# Patient Record
Sex: Female | Born: 1940
Health system: Southern US, Community
[De-identification: ages and names within clinical notes are randomized; demographics above are authoritative.]

## PROBLEM LIST (undated history)

## (undated) DIAGNOSIS — M720 Palmar fascial fibromatosis [Dupuytren]: Secondary | ICD-10-CM

## (undated) DIAGNOSIS — Z9889 Other specified postprocedural states: Secondary | ICD-10-CM

## (undated) DIAGNOSIS — G20A1 Parkinson's disease without dyskinesia, without mention of fluctuations: Secondary | ICD-10-CM

## (undated) DIAGNOSIS — I480 Paroxysmal atrial fibrillation: Secondary | ICD-10-CM

## (undated) DIAGNOSIS — G20 Parkinson's disease: Secondary | ICD-10-CM

## (undated) DIAGNOSIS — I872 Venous insufficiency (chronic) (peripheral): Secondary | ICD-10-CM

## (undated) DIAGNOSIS — M1712 Unilateral primary osteoarthritis, left knee: Secondary | ICD-10-CM

## (undated) DIAGNOSIS — M169 Osteoarthritis of hip, unspecified: Secondary | ICD-10-CM

## (undated) DIAGNOSIS — T6391XA Toxic effect of contact with unspecified venomous animal, accidental (unintentional), initial encounter: Secondary | ICD-10-CM

## (undated) DIAGNOSIS — S52531A Colles' fracture of right radius, initial encounter for closed fracture: Secondary | ICD-10-CM

## (undated) DIAGNOSIS — N959 Unspecified menopausal and perimenopausal disorder: Secondary | ICD-10-CM

## (undated) DIAGNOSIS — I491 Atrial premature depolarization: Secondary | ICD-10-CM

## (undated) HISTORY — DX: Colles' fracture of right radius, initial encounter for closed fracture: S52.531A

## (undated) HISTORY — DX: Osteoarthritis of hip, unspecified: M16.9

## (undated) HISTORY — PX: CATARACT EXTRACTION, BILATERAL: SHX1313

## (undated) HISTORY — PX: BREAST BIOPSY: SHX20

## (undated) HISTORY — DX: Unilateral primary osteoarthritis, left knee: M17.12

## (undated) HISTORY — DX: Other specified postprocedural states: Z98.890

## (undated) HISTORY — DX: Venous insufficiency (chronic) (peripheral): I87.2

## (undated) HISTORY — DX: Atrial premature depolarization: I49.1

## (undated) HISTORY — DX: Toxic effect of contact with unspecified venomous animal, accidental (unintentional), initial encounter: T63.91XA

## (undated) HISTORY — DX: Palmar fascial fibromatosis (dupuytren): M72.0

## (undated) HISTORY — DX: Unspecified menopausal and perimenopausal disorder: N95.9

---

## 1997-03-16 ENCOUNTER — Ambulatory Visit (HOSPITAL_COMMUNITY): Admission: RE | Admit: 1997-03-16 | Discharge: 1997-03-16 | Payer: Self-pay | Admitting: *Deleted

## 1997-03-21 ENCOUNTER — Ambulatory Visit (HOSPITAL_COMMUNITY): Admission: RE | Admit: 1997-03-21 | Discharge: 1997-03-21 | Payer: Self-pay | Admitting: *Deleted

## 1997-12-26 ENCOUNTER — Other Ambulatory Visit: Admission: RE | Admit: 1997-12-26 | Discharge: 1997-12-26 | Payer: Self-pay | Admitting: *Deleted

## 1998-01-30 ENCOUNTER — Encounter: Payer: Self-pay | Admitting: *Deleted

## 1998-01-30 ENCOUNTER — Ambulatory Visit (HOSPITAL_COMMUNITY): Admission: RE | Admit: 1998-01-30 | Discharge: 1998-01-30 | Payer: Self-pay | Admitting: *Deleted

## 1999-01-03 ENCOUNTER — Other Ambulatory Visit: Admission: RE | Admit: 1999-01-03 | Discharge: 1999-01-03 | Payer: Self-pay | Admitting: *Deleted

## 1999-05-23 ENCOUNTER — Encounter: Payer: Self-pay | Admitting: *Deleted

## 1999-05-23 ENCOUNTER — Ambulatory Visit (HOSPITAL_COMMUNITY): Admission: RE | Admit: 1999-05-23 | Discharge: 1999-05-23 | Payer: Self-pay | Admitting: *Deleted

## 2000-01-06 ENCOUNTER — Other Ambulatory Visit: Admission: RE | Admit: 2000-01-06 | Discharge: 2000-01-06 | Payer: Self-pay | Admitting: *Deleted

## 2000-05-27 ENCOUNTER — Encounter: Payer: Self-pay | Admitting: *Deleted

## 2000-05-27 ENCOUNTER — Ambulatory Visit (HOSPITAL_COMMUNITY): Admission: RE | Admit: 2000-05-27 | Discharge: 2000-05-27 | Payer: Self-pay | Admitting: *Deleted

## 2001-01-21 ENCOUNTER — Other Ambulatory Visit: Admission: RE | Admit: 2001-01-21 | Discharge: 2001-01-21 | Payer: Self-pay | Admitting: *Deleted

## 2001-05-31 ENCOUNTER — Ambulatory Visit (HOSPITAL_COMMUNITY): Admission: RE | Admit: 2001-05-31 | Discharge: 2001-05-31 | Payer: Self-pay | Admitting: *Deleted

## 2001-05-31 ENCOUNTER — Encounter: Payer: Self-pay | Admitting: *Deleted

## 2002-02-08 ENCOUNTER — Other Ambulatory Visit: Admission: RE | Admit: 2002-02-08 | Discharge: 2002-02-08 | Payer: Self-pay | Admitting: *Deleted

## 2002-06-06 ENCOUNTER — Encounter: Payer: Self-pay | Admitting: *Deleted

## 2002-06-06 ENCOUNTER — Ambulatory Visit (HOSPITAL_COMMUNITY): Admission: RE | Admit: 2002-06-06 | Discharge: 2002-06-06 | Payer: Self-pay | Admitting: *Deleted

## 2003-03-09 ENCOUNTER — Other Ambulatory Visit: Admission: RE | Admit: 2003-03-09 | Discharge: 2003-03-09 | Payer: Self-pay | Admitting: *Deleted

## 2003-06-07 ENCOUNTER — Ambulatory Visit (HOSPITAL_COMMUNITY): Admission: RE | Admit: 2003-06-07 | Discharge: 2003-06-07 | Payer: Self-pay | Admitting: *Deleted

## 2004-03-12 ENCOUNTER — Other Ambulatory Visit: Admission: RE | Admit: 2004-03-12 | Discharge: 2004-03-12 | Payer: Self-pay | Admitting: *Deleted

## 2004-06-07 ENCOUNTER — Ambulatory Visit (HOSPITAL_COMMUNITY): Admission: RE | Admit: 2004-06-07 | Discharge: 2004-06-07 | Payer: Self-pay | Admitting: *Deleted

## 2004-10-31 ENCOUNTER — Ambulatory Visit: Payer: Self-pay | Admitting: Family Medicine

## 2004-11-08 ENCOUNTER — Encounter: Admission: RE | Admit: 2004-11-08 | Discharge: 2004-11-08 | Payer: Self-pay | Admitting: Family Medicine

## 2004-11-08 ENCOUNTER — Ambulatory Visit: Payer: Self-pay | Admitting: Family Medicine

## 2005-03-25 ENCOUNTER — Other Ambulatory Visit: Admission: RE | Admit: 2005-03-25 | Discharge: 2005-03-25 | Payer: Self-pay | Admitting: *Deleted

## 2005-04-29 ENCOUNTER — Ambulatory Visit: Payer: Self-pay | Admitting: Family Medicine

## 2005-06-10 ENCOUNTER — Ambulatory Visit (HOSPITAL_COMMUNITY): Admission: RE | Admit: 2005-06-10 | Discharge: 2005-06-10 | Payer: Self-pay | Admitting: *Deleted

## 2005-06-30 ENCOUNTER — Encounter: Admission: RE | Admit: 2005-06-30 | Discharge: 2005-06-30 | Payer: Self-pay | Admitting: *Deleted

## 2005-12-25 ENCOUNTER — Ambulatory Visit: Payer: Self-pay | Admitting: Family Medicine

## 2005-12-25 LAB — CONVERTED CEMR LAB
AST: 27 units/L (ref 0–37)
Albumin: 4 g/dL (ref 3.5–5.2)
Alkaline Phosphatase: 36 units/L — ABNORMAL LOW (ref 39–117)
BUN: 13 mg/dL (ref 6–23)
Calcium: 9.4 mg/dL (ref 8.4–10.5)
Chloride: 106 meq/L (ref 96–112)
Chol/HDL Ratio, serum: 2
Creatinine, Ser: 1 mg/dL (ref 0.4–1.2)
Eosinophil percent: 4.7 % (ref 0.0–5.0)
Hemoglobin: 12.2 g/dL (ref 12.0–15.0)
Lymphocytes Relative: 22.6 % (ref 12.0–46.0)
MCHC: 32.2 g/dL (ref 30.0–36.0)
MCV: 90.5 fL (ref 78.0–100.0)
Monocytes Relative: 11.9 % — ABNORMAL HIGH (ref 3.0–11.0)
Neutro Abs: 2.7 10*3/uL (ref 1.4–7.7)
Platelets: 195 10*3/uL (ref 150–400)
RBC: 4.2 M/uL (ref 3.87–5.11)
RDW: 15.3 % — ABNORMAL HIGH (ref 11.5–14.6)
Triglyceride fasting, serum: 35 mg/dL (ref 0–149)
VLDL: 7 mg/dL (ref 0–40)
WBC: 4.4 10*3/uL — ABNORMAL LOW (ref 4.5–10.5)

## 2006-01-05 ENCOUNTER — Ambulatory Visit: Payer: Self-pay | Admitting: Family Medicine

## 2006-08-24 ENCOUNTER — Ambulatory Visit (HOSPITAL_COMMUNITY): Admission: RE | Admit: 2006-08-24 | Discharge: 2006-08-24 | Payer: Self-pay | Admitting: Family Medicine

## 2006-11-03 DIAGNOSIS — J069 Acute upper respiratory infection, unspecified: Secondary | ICD-10-CM | POA: Insufficient documentation

## 2006-11-09 ENCOUNTER — Ambulatory Visit: Payer: Self-pay | Admitting: Family Medicine

## 2007-02-03 ENCOUNTER — Telehealth (INDEPENDENT_AMBULATORY_CARE_PROVIDER_SITE_OTHER): Payer: Self-pay | Admitting: *Deleted

## 2007-02-04 ENCOUNTER — Ambulatory Visit: Payer: Self-pay | Admitting: Family Medicine

## 2007-02-09 ENCOUNTER — Ambulatory Visit: Payer: Self-pay | Admitting: Family Medicine

## 2007-02-09 LAB — CONVERTED CEMR LAB
AST: 20 units/L (ref 0–37)
Albumin: 3.7 g/dL (ref 3.5–5.2)
Bilirubin Urine: NEGATIVE
CO2: 29 meq/L (ref 19–32)
Calcium: 9.5 mg/dL (ref 8.4–10.5)
Chloride: 104 meq/L (ref 96–112)
GFR calc Af Amer: 71 mL/min
Glucose, Bld: 90 mg/dL (ref 70–99)
HCT: 38.5 % (ref 36.0–46.0)
HDL: 83.3 mg/dL (ref >39.0)
Hemoglobin: 13.3 g/dL (ref 12.0–15.0)
Lymphocytes Relative: 13.4 % (ref 12.0–46.0)
MCHC: 34.6 g/dL (ref 30.0–36.0)
Neutrophils Relative %: 72.8 % (ref 43.0–77.0)
Nitrite: NEGATIVE
Potassium: 5.3 meq/L — ABNORMAL HIGH (ref 3.5–5.1)
Total CHOL/HDL Ratio: 2.4
Total Protein: 6.4 g/dL (ref 6.0–8.3)
VLDL: 8 mg/dL (ref 0–40)
WBC Urine, dipstick: NEGATIVE
WBC: 7.6 10*3/uL (ref 4.5–10.5)

## 2007-02-16 ENCOUNTER — Ambulatory Visit: Payer: Self-pay | Admitting: Family Medicine

## 2007-02-16 ENCOUNTER — Encounter: Payer: Self-pay | Admitting: Family Medicine

## 2007-02-16 ENCOUNTER — Other Ambulatory Visit: Admission: RE | Admit: 2007-02-16 | Discharge: 2007-02-16 | Payer: Self-pay | Admitting: Family Medicine

## 2007-02-16 DIAGNOSIS — M169 Osteoarthritis of hip, unspecified: Secondary | ICD-10-CM

## 2007-02-16 DIAGNOSIS — M1610 Unilateral primary osteoarthritis, unspecified hip: Secondary | ICD-10-CM

## 2007-02-16 DIAGNOSIS — I491 Atrial premature depolarization: Secondary | ICD-10-CM

## 2007-02-16 DIAGNOSIS — N959 Unspecified menopausal and perimenopausal disorder: Secondary | ICD-10-CM

## 2007-02-16 DIAGNOSIS — M159 Polyosteoarthritis, unspecified: Secondary | ICD-10-CM | POA: Insufficient documentation

## 2007-02-16 HISTORY — DX: Unilateral primary osteoarthritis, unspecified hip: M16.10

## 2007-02-16 HISTORY — DX: Osteoarthritis of hip, unspecified: M16.9

## 2007-02-16 HISTORY — DX: Atrial premature depolarization: I49.1

## 2007-02-16 HISTORY — DX: Unspecified menopausal and perimenopausal disorder: N95.9

## 2007-07-20 ENCOUNTER — Ambulatory Visit: Payer: Self-pay | Admitting: Family Medicine

## 2007-07-27 ENCOUNTER — Ambulatory Visit: Payer: Self-pay | Admitting: Family Medicine

## 2007-07-27 DIAGNOSIS — T6391XA Toxic effect of contact with unspecified venomous animal, accidental (unintentional), initial encounter: Secondary | ICD-10-CM

## 2007-07-27 HISTORY — DX: Toxic effect of contact with unspecified venomous animal, accidental (unintentional), initial encounter: T63.91XA

## 2007-09-07 ENCOUNTER — Ambulatory Visit (HOSPITAL_COMMUNITY): Admission: RE | Admit: 2007-09-07 | Discharge: 2007-09-07 | Payer: Self-pay | Admitting: Family Medicine

## 2007-09-14 ENCOUNTER — Encounter: Admission: RE | Admit: 2007-09-14 | Discharge: 2007-09-14 | Payer: Self-pay | Admitting: Family Medicine

## 2007-10-24 ENCOUNTER — Telehealth: Payer: Self-pay | Admitting: Family Medicine

## 2007-10-25 ENCOUNTER — Telehealth: Payer: Self-pay | Admitting: Family Medicine

## 2007-12-06 ENCOUNTER — Telehealth: Payer: Self-pay | Admitting: Family Medicine

## 2007-12-07 ENCOUNTER — Telehealth (INDEPENDENT_AMBULATORY_CARE_PROVIDER_SITE_OTHER): Payer: Self-pay | Admitting: *Deleted

## 2008-02-22 ENCOUNTER — Encounter: Admission: RE | Admit: 2008-02-22 | Discharge: 2008-02-22 | Payer: Self-pay | Admitting: Family Medicine

## 2008-03-20 ENCOUNTER — Ambulatory Visit: Payer: Self-pay | Admitting: Family Medicine

## 2008-03-20 LAB — CONVERTED CEMR LAB
ALT: 18 units/L (ref 0–35)
Albumin: 3.8 g/dL (ref 3.5–5.2)
Alkaline Phosphatase: 38 units/L — ABNORMAL LOW (ref 39–117)
BUN: 15 mg/dL (ref 6–23)
Bilirubin, Direct: 0.1 mg/dL (ref 0.0–0.3)
Eosinophils Absolute: 0.2 10*3/uL (ref 0.0–0.7)
GFR calc Af Amer: 80 mL/min
HCT: 37.2 % (ref 36.0–46.0)
HDL: 88.9 mg/dL (ref >39.0)
Hemoglobin, Urine: NEGATIVE
Ketones, ur: NEGATIVE mg/dL
Lymphocytes Relative: 19.9 % (ref 12.0–46.0)
MCHC: 33.3 g/dL (ref 30.0–36.0)
Monocytes Relative: 10.6 % (ref 3.0–12.0)
Neutrophils Relative %: 64 % (ref 43.0–77.0)
Nitrite: NEGATIVE
Platelets: 166 10*3/uL (ref 150–400)
Potassium: 4.1 meq/L (ref 3.5–5.1)
RDW: 16 % — ABNORMAL HIGH (ref 11.5–14.6)
Specific Gravity, Urine: <=1.005 (ref 1.000–1.035)
TSH: 0.88 microintl units/mL (ref 0.35–5.50)
Total Bilirubin: 0.6 mg/dL (ref 0.3–1.2)
Total CHOL/HDL Ratio: 2.2
Total Protein, Urine: NEGATIVE mg/dL
Urobilinogen, UA: 0.2 (ref 0.0–1.0)
VLDL: 6 mg/dL (ref 0–40)
pH: 7.5 (ref 5.0–8.0)

## 2008-04-07 ENCOUNTER — Ambulatory Visit: Payer: Self-pay | Admitting: Family Medicine

## 2008-04-07 ENCOUNTER — Other Ambulatory Visit: Admission: RE | Admit: 2008-04-07 | Discharge: 2008-04-07 | Payer: Self-pay | Admitting: Family Medicine

## 2008-04-07 ENCOUNTER — Encounter: Payer: Self-pay | Admitting: Family Medicine

## 2008-09-12 ENCOUNTER — Encounter: Admission: RE | Admit: 2008-09-12 | Discharge: 2008-09-12 | Payer: Self-pay | Admitting: Family Medicine

## 2009-04-10 ENCOUNTER — Ambulatory Visit: Payer: Self-pay | Admitting: Family Medicine

## 2009-04-10 LAB — CONVERTED CEMR LAB
ALT: 21 units/L (ref 0–35)
Bilirubin Urine: NEGATIVE
Calcium: 9.2 mg/dL (ref 8.4–10.5)
Chloride: 109 meq/L (ref 96–112)
Cholesterol: 195 mg/dL (ref 0–200)
Creatinine, Ser: 1 mg/dL (ref 0.4–1.2)
GFR calc non Af Amer: 58.4 mL/min (ref >60)
Glucose, Bld: 91 mg/dL (ref 70–99)
Hemoglobin, Urine: NEGATIVE
Ketones, ur: NEGATIVE mg/dL
Leukocytes, UA: NEGATIVE
Lymphocytes Relative: 19.7 % (ref 12.0–46.0)
Lymphs Abs: 1 10*3/uL (ref 0.7–4.0)
MCV: 94.5 fL (ref 78.0–100.0)
Monocytes Absolute: 0.6 10*3/uL (ref 0.1–1.0)
Monocytes Relative: 11 % (ref 3.0–12.0)
Neutro Abs: 3.2 10*3/uL (ref 1.4–7.7)
Neutrophils Relative %: 65.5 % (ref 43.0–77.0)
Potassium: 4.6 meq/L (ref 3.5–5.1)
Total CHOL/HDL Ratio: 2
Total Protein, Urine: NEGATIVE mg/dL
Total Protein: 6.9 g/dL (ref 6.0–8.3)
Triglycerides: 36 mg/dL (ref 0.0–149.0)
Urobilinogen, UA: 0.2 (ref 0.0–1.0)
pH: 6.5 (ref 5.0–8.0)

## 2009-04-17 ENCOUNTER — Ambulatory Visit: Payer: Self-pay | Admitting: Family Medicine

## 2009-04-17 ENCOUNTER — Other Ambulatory Visit: Admission: RE | Admit: 2009-04-17 | Discharge: 2009-04-17 | Payer: Self-pay | Admitting: Family Medicine

## 2009-09-13 ENCOUNTER — Encounter: Admission: RE | Admit: 2009-09-13 | Discharge: 2009-09-13 | Payer: Self-pay | Admitting: Family Medicine

## 2010-01-11 ENCOUNTER — Ambulatory Visit: Payer: Self-pay | Admitting: Family Medicine

## 2010-01-11 DIAGNOSIS — R1031 Right lower quadrant pain: Secondary | ICD-10-CM | POA: Insufficient documentation

## 2010-01-16 ENCOUNTER — Ambulatory Visit: Payer: Self-pay | Admitting: Family Medicine

## 2010-01-18 ENCOUNTER — Ambulatory Visit: Payer: Self-pay | Admitting: Cardiovascular Disease

## 2010-01-24 ENCOUNTER — Telehealth: Payer: Self-pay | Admitting: Family Medicine

## 2010-02-10 ENCOUNTER — Encounter: Payer: Self-pay | Admitting: *Deleted

## 2010-02-11 ENCOUNTER — Telehealth: Payer: Self-pay | Admitting: Family Medicine

## 2010-02-21 NOTE — Assessment & Plan Note (Signed)
Summary: abd pain that moves around/dm   Vital Signs:  Patient profile:   70 year old female Menstrual status:  postmenopausal Weight:      136 pounds Temp:     98.1 degrees F oral Pulse rate:   88 / minute Pulse rhythm:   irregular BP sitting:   110 / 78  (left arm) Cuff size:   regular  Vitals Entered By: Kern Reap CMA Duncan Dull) (January 11, 2010 9:26 AM) CC: lower right abd pain   CC:  lower right abd pain.  History of Present Illness: Stephanie Shaw is a 70 year old, divorced female, nonsmoker, who comes in with a two month history of right lower quadrant abdominal pain.  She states for the past two, months she's had right lower quadrant and right flank, abdominal pain.  She describes the pain is intermittent, dull, various intensity from one to 3 and occasionally wake her up at night.  She's had a recent colonoscopy, which was normal.  Review of systems negative specifically, no fever, nausea, vomiting, diarrhea, change in urinary habits, etc., etc.  She's never had a pain like this before.  Again, the intensity is not that great but it is persistent and is waking her up at night.  Allergies: 1)  ! Sulfa  Past History:  Past medical, surgical, family and social histories (including risk factors) reviewed for relevance to current acute and chronic problems.  Past Medical History: Reviewed history from 02/16/2007 and no changes required. childbirth x 2 venous insufficiency R. 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 Benign  Family History: Reviewed history from 02/16/2007 and no changes required. father died 68, lung cancer smoker and alcoholic mother died 13. Pancreatic cancer, diabetes, type II congestive heart failure no brothers two sisters, alive and well one has osteoarthritis  Social History: Reviewed history from 11/09/2006 and no changes required. Occupation: Arts development officer G. Single Divorced Never Smoked Alcohol use-no Drug use-no Regular exercise-yes  Review of Systems      See HPI  Physical Exam  General:  Well-developed,well-nourished,in no acute distress; alert,appropriate and cooperative throughout examination Abdomen:  Bowel sounds positive,abdomen soft and non-tender without masses, organomegaly or hernias noted. Rectal:  No external abnormalities noted. Normal sphincter tone. No rectal masses or tenderness. Genitalia:  Pelvic Exam:        External: normal female genitalia without lesions or masses        Vagina: normal without lesions or masses        Cervix: normal without lesions or masses        Adnexa: normal bimanual exam without masses or fullness        Uterus: normal by palpation        Pap smear: not performed   Problems:  Medical Problems Added: 1)  Dx of Abdominal Pain, Right Lower Quadrant  (ICD-789.03)  Impression & Recommendations:  Problem # 1:  ABDOMINAL PAIN, RIGHT LOWER QUADRANT (ICD-789.03) Assessment New  Orders: Radiology Referral (Radiology)  Complete Medication List: 1)  Mvi  2)  Flax Seed 2000 Mg  3)  Asa 81 Mg  .... Q 3rd day 4)  Estrace 0.1 Mg/gm Crea (Estradiol) .... Apply 2 x weekly 5)  Epipen 2-pak 0.3 Mg/0.50ml (1:1000) Devi (Epinephrine hcl (anaphylaxis)) .... Uad for allergic reaction to eggs 6)  Iron  .... One tab three times a week  Patient Instructions: 1)  we will get she is set up for a  CT scan of your abdomen to begin an evaluation of the cause of your abdominal pain.   Orders Added: 1)  Est. Patient Level IV [55732] 2)  Radiology Referral [Radiology]

## 2010-02-21 NOTE — Miscellaneous (Signed)
Summary: Waiver of Liability for Zostavax  Waiver of Liability for Zostavax   Imported By: Maryln Gottron 04/19/2009 13:19:53  _____________________________________________________________________  External Attachment:    Type:   Image     Comment:   External Document

## 2010-02-21 NOTE — Progress Notes (Signed)
  Phone Note Outgoing Call   Summary of Call: I called Stephanie Shaw.  She still symptomatic.  Reviewed studies a CT scan, which show some constipation and a right ovarian cyst.  Recommend she take a stool softener to have two to 3 loose bowel movements to clean out her colon.  If after two weeks.  Symptoms are persistent, then I want to recheck her in the office.  Also explained what a right ovarian cyst was possibly that could rupture severe pain, etc. Initial call taken by: Roderick Pee MD,  January 24, 2010 7:25 AM

## 2010-02-21 NOTE — Progress Notes (Signed)
  Phone Note Call from Patient   Caller: Patient Call For: Roderick Pee MD Summary of Call: Special Diet has been basically cured .  FYI Initial call taken by: Lynann Beaver CMA AAMA,  February 11, 2010 2:19 PM  Follow-up for Phone Call        Provider Notified Follow-up by: Roderick Pee MD,  February 11, 2010 5:38 PM

## 2010-02-21 NOTE — Assessment & Plan Note (Signed)
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 who comes in today for physical examination  She uses the hormonal cream twice, weekly, it's working well.  She also has an EpiPen because of history of hives with bee stings.  Her past history, social family history are reviewed in detail.  There been no significant changes.  She continues to remain physically active.  Her weight is 134.  Her mood is good.  Hearing is normal in ADLs and normal.  Minimal fall risk.  Home safety reviewed normal height, weight visual acuity, unchanged.  Annual check for glaucoma.  she has a history of PACs asymptomatic  Allergies: 1)  ! Sulfa  Past History:  Past medical, surgical, family and social histories (including risk factors) reviewed, and no changes noted (except as noted below).  Past Medical History: Reviewed history from 02/16/2007 and no changes required. childbirth x 2 venous insufficiency R. 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 Benign  Family History: Reviewed history from 02/16/2007 and no changes required. father died 80, lung cancer smoker and alcoholic mother died 57. Pancreatic cancer, diabetes, type II congestive heart failure no brothers two sisters, alive and well one has osteoarthritis  Social History: Reviewed history from 11/09/2006 and no changes required. Occupation: Engineer, production G. Single Divorced Never Smoked Alcohol use-no Drug use-no Regular  exercise-yes  Review of Systems      See HPI  Physical Exam  General:  Well-developed,well-nourished,in no acute distress; alert,appropriate and cooperative throughout examination Head:  Normocephalic and atraumatic without obvious abnormalities. No apparent alopecia or balding. Eyes:  No corneal or conjunctival inflammation noted. EOMI. Perrla. Funduscopic exam benign, without hemorrhages, exudates or papilledema. Vision grossly normal. Ears:  External ear exam shows no significant lesions or deformities.  Otoscopic examination reveals clear canals, tympanic membranes are intact bilaterally without bulging, retraction, inflammation or discharge. Hearing is grossly normal bilaterally. Nose:  External nasal examination shows no deformity or inflammation. Nasal mucosa are pink and moist without lesions or exudates. Mouth:  Oral mucosa and oropharynx without lesions or exudates.  Teeth in good repair. Neck:  No deformities, masses, or tenderness noted. Chest Wall:  No deformities, masses, or tenderness noted. Breasts:  No mass, nodules, thickening, tenderness, bulging, retraction, inflamation, nipple discharge or skin changes noted.   Lungs:  Normal respiratory effort, chest expands symmetrically. Lungs are clear to auscultation, no crackles or wheezes. Heart:  Normal rate and regular rhythm. S1 and S2 normal without gallop, murmur, click, rub or other extra sounds. Abdomen:  Bowel sounds positive,abdomen soft and non-tender without masses, organomegaly or hernias noted. Rectal:  No external abnormalities noted. Normal sphincter tone. No rectal masses or tenderness. Genitalia:  Pelvic Exam:        External: normal female genitalia without lesions or masses        Vagina: normal without lesions or masses  Cervix: normal without lesions or masses        Adnexa: normal bimanual exam without masses or fullness        Uterus: normal by palpation        Pap smear: performed Msk:  No deformity  or scoliosis noted of thoracic or lumbar spine.   Pulses:  R and L carotid,radial,femoral,dorsalis pedis and posterior tibial pulses are full and equal bilaterally Extremities:  No clubbing, cyanosis, edema, or deformity noted with normal full range of motion of all joints.   Neurologic:  No cranial nerve deficits noted. Station and gait are normal. Plantar reflexes are down-going bilaterally. DTRs are symmetrical throughout. Sensory, motor and coordinative functions appear intact. Skin:  Intact without suspicious lesions or rashes Cervical Nodes:  No lymphadenopathy noted Axillary Nodes:  No palpable lymphadenopathy Inguinal Nodes:  No significant adenopathy Psych:  Cognition and judgment appear intact. Alert and cooperative with normal attention span and concentration. 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 Apply 2 x weekly  Orders: Subsequent annual wellness visit with prevention plan (Y7829)  Problem # 4:  HEALTH SCREENING (ICD-V70.0) Assessment: Unchanged  Orders: Subsequent annual wellness visit with prevention plan (F6213)  Complete Medication List: 1)  Mvi  2)  Flax Seed 2000 Mg  3)  Asa 81 Mg  .... Q 3rd day 4)  Estrace 0.1 Mg/gm Crea (Estradiol) .... Apply 2 x weekly 5)  Epipen 2-pak 0.3 Mg/0.18ml (1:1000) Devi (Epinephrine hcl (anaphylaxis)) .... Uad for allergic reaction to eggs 6)  Iron  .... One tab three times a week  Other  Orders: Zoster (Shingles) Vaccine Live [phone]) Admin 1st Vaccine (84696)  Patient Instructions: 1)  Please schedule a follow-up appointment in 1 year. 2)  It is important that you exercise regularly at least 20 minutes 5 times a week. If you develop chest pain, have severe difficulty breathing, or feel very tired , stop exercising immediately and seek medical attention. 3)  Schedule your mammogram. 4)  Schedule a colonoscopy/sigmoidoscopy to help detect colon cancer. 5)  Take calcium +Vitamin D daily. 6)  Take an Aspirin every day. Prescriptions: EPIPEN 2-PAK 0.3 MG/0.3ML (1:1000) DEVI (EPINEPHRINE HCL (ANAPHYLAXIS)) UAD for allergic reaction to eggs  #1 x 1   Entered and Authorized by:   Roderick Pee MD   Signed by:   Roderick Pee MD on 04/17/2009   Method used:   Electronically to        [facility]  [phone]* (retail)       [address] [place], [place]  [address]       Ph: [phone] or [phone]       Fax: [phone]   RxID:   [id number] ESTRACE 0.1 MG/GM  CREA (ESTRADIOL) apply 2 x weekly  #3 tubes x 4   Entered and Authorized by:   Roderick Pee MD   Signed by:   Roderick Pee MD on 04/17/2009   Method used:   Electronically to  [facility]  [phone]* (retail)       [address] [place], [place]  [address]       Ph: [phone] or [phone]       Fax: [phone]   RxID:   [id number]    Immunizations Administered:  Zostavax # 1:    Vaccine Type: Zostavax    Site: left deltoid    Mfr: Merck    Dose: 0.5 ml    Route: [place]    Given by: Kern Reap CMA (AAMA)    Exp. Date: 02/07/2010    Lot #: [id number]    Physician counseled: yes

## 2010-03-26 ENCOUNTER — Ambulatory Visit (INDEPENDENT_AMBULATORY_CARE_PROVIDER_SITE_OTHER): Payer: BC Managed Care – PPO | Admitting: Internal Medicine

## 2010-03-26 ENCOUNTER — Encounter: Payer: Self-pay | Admitting: Internal Medicine

## 2010-03-26 DIAGNOSIS — S81819A Laceration without foreign body, unspecified lower leg, initial encounter: Secondary | ICD-10-CM

## 2010-03-26 DIAGNOSIS — S81809A Unspecified open wound, unspecified lower leg, initial encounter: Secondary | ICD-10-CM

## 2010-03-26 DIAGNOSIS — M169 Osteoarthritis of hip, unspecified: Secondary | ICD-10-CM

## 2010-03-26 NOTE — Patient Instructions (Signed)
Call if you develop pain redness or drainage from the wound involving the left lower leg Call or return to clinic prn if these symptoms worsen or fail to improve as anticipated.   Clean the area daily with soap and water and apply antibiotic ointment

## 2010-03-26 NOTE — Progress Notes (Signed)
  Subjective:    Patient ID: Stephanie Shaw, female    DOB: 1940/06/19, 70 y.o.   MRN: [id number]  HPI   70 year old patient who sustained a fall and laceration to the left lower posterior leg 6 days ago. She also landed on her knees sustaining ecchymoses. She will be  Leaving  out of state tomorrow  M. Is concerned about a possible soft tissue infection. There is been no pain drainage or redness.  Review of Systems  Constitutional: Negative.   HENT: Negative for hearing loss, congestion, sore throat, rhinorrhea, dental problem, sinus pressure and tinnitus.   Eyes: Negative for pain, discharge and visual disturbance.  Respiratory: Negative for cough and shortness of breath.   Cardiovascular: Negative for chest pain, palpitations and leg swelling.  Gastrointestinal: Negative for nausea, vomiting, abdominal pain, diarrhea, constipation, blood in stool and abdominal distention.  Genitourinary: Negative for dysuria, urgency, frequency, hematuria, flank pain, vaginal bleeding, vaginal discharge, difficulty urinating, vaginal pain and pelvic pain.  Musculoskeletal: Negative for joint swelling, arthralgias and gait problem.  Skin: Positive for wound. Negative for rash.  Neurological: Negative for dizziness, syncope, speech difficulty, weakness, numbness and headaches.  Hematological: Negative for adenopathy.  Psychiatric/Behavioral: Negative for behavioral problems, dysphoric mood and agitation. The patient is not nervous/anxious.        Objective:   Physical Exam  Constitutional: She appears well-developed and well-nourished. No distress.  Skin: Skin is warm and dry.        Ecchymoses were present over both knees the left greater than the right;  She had a superficial laceration abrasion involving her left posterior lower leg. There is some mild associated ecchymoses but no erythema induration tenderness          Assessment & Plan:   laceration abrasion left lower leg. No signs of  infection. Local skin care discussed she'll continue topical antibiotic therapy and clean and cover daily

## 2010-05-06 ENCOUNTER — Other Ambulatory Visit: Payer: Self-pay | Admitting: Family Medicine

## 2010-05-06 ENCOUNTER — Other Ambulatory Visit (INDEPENDENT_AMBULATORY_CARE_PROVIDER_SITE_OTHER): Payer: BC Managed Care – PPO

## 2010-05-06 DIAGNOSIS — Z0000 Encounter for general adult medical examination without abnormal findings: Secondary | ICD-10-CM

## 2010-05-06 LAB — URINALYSIS
Ketones, ur: NEGATIVE
Leukocytes, UA: NEGATIVE
Nitrite: NEGATIVE
pH: 6 (ref 5.0–8.0)

## 2010-05-06 LAB — HEPATIC FUNCTION PANEL
Alkaline Phosphatase: 41 U/L (ref 39–117)
Bilirubin, Direct: 0.1 mg/dL (ref 0.0–0.3)
Total Bilirubin: 0.8 mg/dL (ref 0.3–1.2)

## 2010-05-06 LAB — LIPID PANEL
Cholesterol: 190 mg/dL (ref 0–200)
HDL: 93.5 mg/dL (ref >39.00)
LDL Cholesterol: 90 mg/dL (ref 0–99)
Total CHOL/HDL Ratio: 2
VLDL: 6.2 mg/dL (ref 0.0–40.0)

## 2010-05-06 LAB — BASIC METABOLIC PANEL
Chloride: 106 mEq/L (ref 96–112)
Potassium: 4.4 mEq/L (ref 3.5–5.1)

## 2010-05-06 LAB — CBC WITH DIFFERENTIAL/PLATELET
Basophils Relative: 1 % (ref 0.0–3.0)
Eosinophils Absolute: 0.1 10*3/uL (ref 0.0–0.7)
HCT: 40.4 % (ref 36.0–46.0)
Hemoglobin: 13.6 g/dL (ref 12.0–15.0)
MCHC: 33.7 g/dL (ref 30.0–36.0)
MCV: 95.5 fl (ref 78.0–100.0)
Monocytes Absolute: 0.3 10*3/uL (ref 0.1–1.0)
Neutro Abs: 2.5 10*3/uL (ref 1.4–7.7)
RBC: 4.23 Mil/uL (ref 3.87–5.11)

## 2010-05-13 ENCOUNTER — Encounter: Payer: Self-pay | Admitting: Family Medicine

## 2010-05-13 ENCOUNTER — Ambulatory Visit (INDEPENDENT_AMBULATORY_CARE_PROVIDER_SITE_OTHER): Payer: BC Managed Care – PPO | Admitting: Family Medicine

## 2010-05-13 VITALS — BP 108/70 | Temp 97.9°F | Ht 67.5 in | Wt 136.0 lb

## 2010-05-13 DIAGNOSIS — M25561 Pain in right knee: Secondary | ICD-10-CM

## 2010-05-13 DIAGNOSIS — I499 Cardiac arrhythmia, unspecified: Secondary | ICD-10-CM

## 2010-05-13 DIAGNOSIS — N959 Unspecified menopausal and perimenopausal disorder: Secondary | ICD-10-CM

## 2010-05-13 DIAGNOSIS — I4891 Unspecified atrial fibrillation: Secondary | ICD-10-CM

## 2010-05-13 DIAGNOSIS — I491 Atrial premature depolarization: Secondary | ICD-10-CM

## 2010-05-13 MED ORDER — DILTIAZEM HCL 30 MG PO TABS: 30.0000 mg | ORAL_TABLET | Freq: Three times a day (TID) | ORAL | Status: DC

## 2010-05-13 NOTE — Patient Instructions (Signed)
Take your aspirin daily.  Begin Cardizem 30 mg daily.  We will set showed a consult time with Dr. Shawna Clamp for cardiac evaluation
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
## 2010-05-14 ENCOUNTER — Telehealth: Payer: Self-pay | Admitting: Cardiology

## 2010-05-14 NOTE — Telephone Encounter (Signed)
Terri states pt needed to be seen asap. Made a appt for may 1 for dr Shirlee Latch double book. Please let her know or mildred if its okay.

## 2010-05-17 ENCOUNTER — Encounter: Payer: Self-pay | Admitting: Cardiology

## 2010-05-21 ENCOUNTER — Ambulatory Visit (INDEPENDENT_AMBULATORY_CARE_PROVIDER_SITE_OTHER): Payer: BC Managed Care – PPO | Admitting: Cardiology

## 2010-05-21 ENCOUNTER — Encounter: Payer: Self-pay | Admitting: Cardiology

## 2010-05-21 VITALS — BP 124/62 | HR 88 | Ht 67.0 in | Wt 133.0 lb

## 2010-05-21 DIAGNOSIS — I4891 Unspecified atrial fibrillation: Secondary | ICD-10-CM

## 2010-05-21 HISTORY — DX: Unspecified atrial fibrillation: I48.91

## 2010-05-21 MED ORDER — RIVAROXABAN 20 MG PO TABS: 20.0000 mg | ORAL_TABLET | Freq: Every day | ORAL | Status: DC

## 2010-05-21 MED ORDER — DILTIAZEM HCL ER COATED BEADS 120 MG PO CP24: 120.0000 mg | ORAL_CAPSULE | Freq: Every day | ORAL | Status: DC

## 2010-05-21 NOTE — Assessment & Plan Note (Addendum)
Patient has minimally symptomatic atrial fibrillation.  It may have been persistent between 4/23 and today.  CHADSVASC score is 2 (female gender, age > 106).  Her mother had atrial fibrillation.   - Echocardiogram for cardiac function. - Change short-acting diltiazem to diltiazem CD 120 mg daily.   - CHADS2VASC score of 2 places the patient at 2.2% risk for stroke over the next year.  Given her low bleeding risk, it would be reasonable to anticoagulate her.  She is amenable to this.  My first choice here will be rivaroxaban 20 mg daily.  If this is not covered by her insurance, will try Pradaxa 150 mg bid.  If this is also uncovered, would go with coumadin.  On anticoagulation, she will stop aspirin.  - Patient is minimally symptomatic at this time with rate control on diltiazem.  There is no clear indication for cardioversion.  I will see her back in 1 month after she gets on anticoagulation.  If she feels that her exercise tolerance has gone down in atrial fibrillation, we can consider a cardioversion at that time.

## 2010-05-21 NOTE — Progress Notes (Signed)
PCP: Dr. Tawanna Cooler  70 yo with newly-noted atrial fibrillation presents for cardiology evaluation.  Patient has had prior history of PACs, but atrial fibrillation was noted for the first time on 05/13/10.  She says that she noted her heart rate running a bit high prior to her visit to Dr. 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  She had a regular visit with Dr. Tawanna Cooler on 4/23 and was noted to be in atrial fibrillation.  She was started on aspirin and diltiazem 30 mg tid.  She is still in atrial fibrillation today with heart rate in the 80s-90s.  Prior to 4/23, she has never been noted to have atrial fibrillation, but she did have an episode in 12/11 where her resting heart rate was >100 and she was not allowed to donate blood. She has not noted any exercise limitation.  She walks, hikes, and occasionally bikes for exercise.  No exertional dyspnea.  No chest pain.  No lightheadedness or syncope.  In early 4/12, she had a vertigo-type episode, but this resolved. Of note, her mother had atrial fibrillation.  No known heart valvular disease, no HTN, no heavy ETOH intake.  TSH was normal on recent check.  No bleeding history.   ECG (05/13/10): Atrial fibrillation, rate 126 ECG: (05/21/10): Atrial fibrillation, rate 91  Labs (4/12): TSH normal, LFTs normal, HCT 40, K 4.2, creatinine 0.9, LDL 90, HDL 94  PMH:  1. Atrial fibrillation: First noted on 05/13/10.  Had PACs noted prior to this. 2. Duputren's contractures 3. Osteoarthritis 4. Venous insufficiency  SH: Divorced, math professor at [employer], has children, lives alone.  Nonsmoker, rare ETOH.   FH: Mother with atrial fibrillation, CHF, pacemaker, and pancreatic cancer.  Father with lung cancer.   ROS: All systems reviewed and negative except as per HPI.   Current Outpatient Prescriptions  Medication Sig Dispense Refill  . calcium-vitamin D 250-100 MG-UNIT per tablet Take 1 tablet by mouth daily.        Marland Kitchen  EPINEPHrine (EPIPEN 2-PAK) 0.3 mg/0.3 mL DEVI Inject 0.3 mg into the muscle once.        Marland Kitchen estradiol (ESTRACE) 0.1 MG/GM vaginal cream Place 2 g vaginally 2 (two) times a week.        . ferrous sulfate 325 (65 FE) MG EC tablet Take 325 mg by mouth 3 (three) times a week.        . Flaxseed, Linseed, (FLAX SEED OIL) 1000 MG CAPS Take 2 capsules by mouth daily.        . Multiple Vitamin (MULTIVITAMIN) tablet Take 1 tablet by mouth daily.        Marland Kitchen DISCONTD: aspirin 81 MG tablet Take 81 mg by mouth daily.        Marland Kitchen DISCONTD: diltiazem (CARDIZEM) 30 MG tablet Take 1 tablet (30 mg total) by mouth 3 (three) times daily.  90 tablet  3  . diltiazem (CARDIZEM CD) 120 MG 24 hr capsule Take 1 capsule (120 mg total) by mouth daily.  30 capsule  11  . Rivaroxaban (XARELTO) 20 MG TABS Take 20 mg by mouth daily.  30 tablet  6  . DISCONTD: IRON PO 1 tab three times a week.         BP 124/62  Pulse 88  Ht 5\' 7"  (1.702 m)  Wt 133 lb (60.328 kg)  BMI 20.83 kg/m2 General: NAD Neck: No JVD, no thyromegaly or thyroid nodule.  Lungs:  Clear to auscultation bilaterally with normal respiratory effort. CV: Nondisplaced PMI.  Heart regular S1/S2, no S3/S4, no murmur.  No peripheral edema.  No carotid bruit.  Normal pedal pulses.  Abdomen: Soft, nontender, no hepatosplenomegaly, no distention.  Skin: Intact without lesions or rashes.  Neurologic: Alert and oriented x 3.  Psych: Normal affect. Extremities: No clubbing or cyanosis.  HEENT: Normal.

## 2010-05-21 NOTE — Patient Instructions (Signed)
Your physician recommends that you schedule a follow-up appointment in: 1 month with Dr. Shirlee Latch Your physician has requested that you have an echocardiogram. Echocardiography is a painless test that uses sound waves to create images of your heart. It provides your doctor with information about the size and shape of your heart and how well your heart's chambers and valves are working. This procedure takes approximately one hour. There are no restrictions for this procedure. Your physician has recommended you make the following change in your medication: Finish short acting cardizem that you have. When finished start Cardizem CD 120 mg by mouth daily. Start Xarelto 20 mg by mouth daily. If not covered by insurance start Pradaxa 150 mg by mouth twice daily.  Please call us to let us know if you do not start Xarelto.  Once you start Xarelto or Pradaxa you should stop your aspirin.

## 2010-06-10 ENCOUNTER — Ambulatory Visit (HOSPITAL_COMMUNITY): Payer: BC Managed Care – PPO | Attending: Family Medicine | Admitting: Radiology

## 2010-06-10 DIAGNOSIS — I059 Rheumatic mitral valve disease, unspecified: Secondary | ICD-10-CM | POA: Insufficient documentation

## 2010-06-10 DIAGNOSIS — I379 Nonrheumatic pulmonary valve disorder, unspecified: Secondary | ICD-10-CM | POA: Insufficient documentation

## 2010-06-10 DIAGNOSIS — I4891 Unspecified atrial fibrillation: Secondary | ICD-10-CM | POA: Insufficient documentation

## 2010-06-10 DIAGNOSIS — I079 Rheumatic tricuspid valve disease, unspecified: Secondary | ICD-10-CM | POA: Insufficient documentation

## 2010-06-17 ENCOUNTER — Emergency Department (HOSPITAL_COMMUNITY)
Admission: EM | Admit: 2010-06-17 | Discharge: 2010-06-17 | Disposition: A | Discharge status: Home / Self Care | Payer: BC Managed Care – PPO | Attending: Emergency Medicine | Admitting: Emergency Medicine

## 2010-06-17 ENCOUNTER — Emergency Department (HOSPITAL_COMMUNITY): Payer: BC Managed Care – PPO

## 2010-06-17 DIAGNOSIS — X500XXA Overexertion from strenuous movement or load, initial encounter: Secondary | ICD-10-CM | POA: Insufficient documentation

## 2010-06-17 DIAGNOSIS — M542 Cervicalgia: Secondary | ICD-10-CM | POA: Insufficient documentation

## 2010-06-17 DIAGNOSIS — I4891 Unspecified atrial fibrillation: Secondary | ICD-10-CM | POA: Insufficient documentation

## 2010-06-17 DIAGNOSIS — R51 Headache: Secondary | ICD-10-CM | POA: Insufficient documentation

## 2010-06-17 DIAGNOSIS — S139XXA Sprain of joints and ligaments of unspecified parts of neck, initial encounter: Secondary | ICD-10-CM | POA: Insufficient documentation

## 2010-06-17 DIAGNOSIS — R42 Dizziness and giddiness: Secondary | ICD-10-CM | POA: Insufficient documentation

## 2010-06-17 DIAGNOSIS — Z79899 Other long term (current) drug therapy: Secondary | ICD-10-CM | POA: Insufficient documentation

## 2010-06-18 ENCOUNTER — Telehealth: Payer: Self-pay | Admitting: *Deleted

## 2010-06-18 NOTE — Telephone Encounter (Signed)
[facility] Triage Call Report Triage Record Num: [id number] Operator: Elita Boone Patient Name: Stephanie Shaw Call Date & Time: 06/17/2010 8:19:19AM Patient Phone: [phone] PCP: Eugenio Hoes. Todd Patient Gender: Female PCP Fax : [phone] Patient DOB: [birth year] [facility] Name: [facility] Reason for Call: Pt is calling to report that she is have neck pain, onset "1 wk". Pt reports that pain is worse over the last couple of days. Pt instructed to be seen in ER for neck pain and sever headache. Care advice given for Necl Pain . Protocol(s) Used: Neck Pain or Injury Recommended Outcome per Protocol: See ED Immediately Reason for Outcome: Neck pain with forward head movement (no injury) AND severe generalized headache, fever, or altered mental status Care Advice: ~ Another adult should drive. ~ IMMEDIATE ACTION Write down provider's name. List or place the following in a bag for transport with the patient: current prescription and/or nonprescription medications; alternative treatments, therapies and medications; and street drugs. ~ ~ Call EMS 911 if new or worsening drowsiness/difficulty awakening, confusion, or seizure. May have clear liquids (such as water, clear fruit juices without pulp, soda, tea or coffee without dairy or non-dairy creamer, clear broth or bouillon, oral hydration solution, or plain gelatin, fruit ices/popsicles, hard candy) but do not eat solid foods before being seen by your provider. ~ 06/17/2010 8:29:48AM Page 1 of 1 [email or web address]

## 2010-06-20 ENCOUNTER — Encounter: Payer: Self-pay | Admitting: Family Medicine

## 2010-06-20 ENCOUNTER — Ambulatory Visit (INDEPENDENT_AMBULATORY_CARE_PROVIDER_SITE_OTHER): Payer: BC Managed Care – PPO | Admitting: Family Medicine

## 2010-06-20 VITALS — BP 102/64 | Temp 98.3°F | Wt 135.0 lb

## 2010-06-20 DIAGNOSIS — M542 Cervicalgia: Secondary | ICD-10-CM

## 2010-06-20 DIAGNOSIS — E042 Nontoxic multinodular goiter: Secondary | ICD-10-CM

## 2010-06-20 HISTORY — DX: Cervicalgia: M54.2

## 2010-06-20 NOTE — Progress Notes (Signed)
  Subjective:    Patient ID: Stephanie Shaw, female    DOB: 04/29/1940, 70 y.o.   MRN: [id number]  HPIWalker is a 89-year-old female, who comes in today for evaluation of neck pain.  She states over the last couple months.  She has changed the way.  She works at work and has developed neck pain.  She points to the C2, C3, area in the midline of her neck.  Over this past weekend.  The neck pain went from a 4 to a 9 and because of severe pain.  She went to the emergency room.  CT scan of her head was normal.  CT scan of the spine shows some disk disease facet disk space narrowing.  Also noticed some cervical nodules.  Today, her pain is more dull, a 4 on a scale of one to 10 and tolerable.  She is stop the Flexeril and the pain pills.  No history of trauma.  No history of previous thyroid disease, nor radiation    Review of Systems General neurologic and endocrinologic review of systems otherwise negative   Objective:   Physical Exam    Well-developed well-nourished, female, no acute distress.  Neurologic exam shows normal sensation, strength muscles and reflexes.  She has tenderness at C3, C4, area to palpation.  Thyroid gland feels normal.  I cannot palpate any abnormalities    Assessment & Plan:  Cervical disk disease,,,,,,,,,,, PT consult for evaluation and treatment.  Thyroid nodules on CT scan,,,,,,,,,,, ultrasound per radiologist.  Recommendation

## 2010-06-20 NOTE — Patient Instructions (Signed)
We will get to set up for a consult for evaluation and treatment for your neck problem.  We will also get to set up for an ultrasound of the thyroid gland.  I will call you the report

## 2010-06-24 ENCOUNTER — Other Ambulatory Visit: Payer: Self-pay | Admitting: Family Medicine

## 2010-06-24 DIAGNOSIS — E041 Nontoxic single thyroid nodule: Secondary | ICD-10-CM

## 2010-06-26 ENCOUNTER — Ambulatory Visit
Admission: RE | Admit: 2010-06-26 | Discharge: 2010-06-26 | Disposition: A | Discharge status: Home / Self Care | Payer: BC Managed Care – PPO | Source: Ambulatory Visit | Attending: Family Medicine | Admitting: Family Medicine

## 2010-06-26 ENCOUNTER — Other Ambulatory Visit: Payer: BC Managed Care – PPO

## 2010-06-26 DIAGNOSIS — E041 Nontoxic single thyroid nodule: Secondary | ICD-10-CM

## 2010-06-27 ENCOUNTER — Telehealth: Payer: Self-pay | Admitting: *Deleted

## 2010-06-27 ENCOUNTER — Telehealth: Payer: Self-pay | Admitting: Cardiology

## 2010-06-27 NOTE — Telephone Encounter (Signed)
Triage vm--------received a shot on 06-17-2010. She is having more bruising on her back at the injection site. She will call her cardiologist in between time to check about her medication and the bruising. Please advise.

## 2010-06-27 NOTE — Telephone Encounter (Signed)
Patient states she started Xarelto 20 mg daily a month on May 1st./12. On May 28 th she went to ER for neck pain and stiffeners. An injection on her  hip  was given at that time. Pt. States at the beginning it  was a small bruise at the injection site. It got bigger over the next 10 to 12 days. She does not seen any bruise change or spreading the last few days. Pt has stop taken the medication today. Scott weaver [place] consulted. He recommended for pt. To continue taken Xarelto 20 mg to prevent a blood clots due to Dx. A  Fib. Pt to call PCP for the bruising. Patient aware. She verbalized understanding.

## 2010-06-27 NOTE — Telephone Encounter (Signed)
C/o bruise on back hip seem to be getting bigger. Pt stop taken the blood thinner this am.

## 2010-06-27 NOTE — Progress Notes (Signed)
Quick Note:  Pt informed and she voiced her understanding ______ 

## 2010-06-27 NOTE — Telephone Encounter (Signed)
[facility] Triage Call Report Triage Record Num: [id number] Operator: Remonia Richter Patient Name: Stephanie Shaw Call Date & Time: 06/26/2010 7:46:20PM Patient Phone: [phone] PCP: Eugenio Hoes. Todd Patient Gender: Female PCP Fax : [phone] Patient DOB: 10-06-1940 Practice Name: [facility] Reason for Call: got an anti-inflammatory injection 06/17/10 in left hip and bruise bigger tonight, she noted this when she looked in the mirror, now sees it across lower back and buttock,nontender and dark in color, has not looked at the bruise in a week,asking about holding her thinner/Xarelton tonight, all emergent s/s ruled out,home care advise given per Abrasion/wound guideline, callback perimeters gone over,Dr Drue Novel called and informed and advised she mark the bleeding edges and watch for increase in size tonight, hold Xarelto in am, call office for eval of area and go to ER for worsening s/s as told her her per MD advise Protocol(s) Used: Abrasions, Lacerations, Puncture Wounds Recommended Outcome per Protocol: See Provider within 72 Hours Reason for Outcome: Prolonged bleeding from minor cuts, nicks, etc. Care Advice: ~ Do not change prescribed medications, dosing regimen, or other treatments until consulting with your provider. Immediately after an injury apply a cold compress or cloth-covered ice pack for 15 to 20 minutes to reduce the amount of bruising and swelling. ~ ~ Thoroughly wash hands with soap and water before and after touching the site. Wash around wound gently with mild soap and water, then wash wound itself with just water or saline, if it is available. Pat dry. ~ Cover wound with a clean cloth and apply firm pressure to stop the bleeding. Hold steady pressure for 10 minutes or until bleeding stops. Apply a clean dressing. ~ ~ SYMPTOM / CONDITION MANAGEMENT ~ CAUTIONS DO NOT take aspirin, or aspirin-containing medications, ibuprofen or naproxen until consulting  with provider. These medications can increase risk of bleeding or bruising and delays wound healing. ~ ~ If have recently started a new prescribed, nonprescribed, or alternative medicine, call your provider. ~ See a provider immediately if bleeding is not controlled with 15 minutes of direct pressure. 06/26/2010 8:15:39PM Page 1 of 1 [email or web address]

## 2010-07-09 ENCOUNTER — Ambulatory Visit (INDEPENDENT_AMBULATORY_CARE_PROVIDER_SITE_OTHER): Payer: BC Managed Care – PPO | Admitting: Family Medicine

## 2010-07-09 ENCOUNTER — Encounter: Payer: Self-pay | Admitting: Family Medicine

## 2010-07-09 VITALS — BP 102/74 | Temp 98.5°F | Wt 135.0 lb

## 2010-07-09 DIAGNOSIS — E042 Nontoxic multinodular goiter: Secondary | ICD-10-CM

## 2010-07-09 NOTE — Progress Notes (Signed)
  Subjective:    Patient ID: Stephanie Shaw, female    DOB: 1940/04/06, 70 y.o.   MRN: [id number]  HPI Stephanie Shaw is a 70 year old, divorced female, nonsmoker, who comes in today for evaluation of a multinodular goiter.  The scan was reviewed in detailed with the patient.  She has multiple benign-appearing nodules.  Her TSH level is .55 and has been this way for over for 5 years.  She currently asymptomatic with no dysphasia.  We discussed for his options, which including watchful waiting, doing nothing, thyroid supplementation to decrease the size of the nodules however, since she is not having symptoms I would not recommend any medication at this juncture.  Review of Systems    General an endocrinologic review of systems negative except she is due to go back and see Dr. Roger Shelter.  This week.  She is in atrial fibrillation.  Currently rate is 80 to 90 and irregular.  She is on anticoagulation Objective:   Physical Exam     Well-developed well-nourished, female in no acute distress.  Examination.  Next is multiple nodules throughout both glands   Assessment & Plan:  Multinodular goiter.  Plan observe check TSH level and physical exam in 3 months

## 2010-07-09 NOTE — Patient Instructions (Signed)
Follow-up in 3 months, sooner if any problems remember to get nonfasting thyroid labs one week prior

## 2010-07-11 ENCOUNTER — Encounter: Payer: Self-pay | Admitting: Cardiology

## 2010-07-11 ENCOUNTER — Ambulatory Visit (INDEPENDENT_AMBULATORY_CARE_PROVIDER_SITE_OTHER): Payer: BC Managed Care – PPO | Admitting: Cardiology

## 2010-07-11 VITALS — BP 104/73 | Ht 67.75 in | Wt 132.8 lb

## 2010-07-11 DIAGNOSIS — I4891 Unspecified atrial fibrillation: Secondary | ICD-10-CM

## 2010-07-11 MED ORDER — DILTIAZEM HCL ER COATED BEADS 180 MG PO CP24: 180.0000 mg | ORAL_CAPSULE | Freq: Every day | ORAL | Status: DC

## 2010-07-11 NOTE — Patient Instructions (Signed)
Increase Diltiazem CD to 180mg  daily.  You have an appointment with Dr Shirlee Latch Wednesday September 26,2012 at 4:15pm.

## 2010-07-13 NOTE — Assessment & Plan Note (Signed)
Patient has minimally symptomatic persistent atrial fibrillation.  CHADSVASC score is 2 (female gender, age > 30).  Her mother had atrial fibrillation.  Echo showed preserved LV function and no significant valvular dysfunction.   - She will continue diltiazem CD and Xarelto.  I will increase the diltiazem CD to 180 mg daily as her HR is a bit elevated at rest.  - Patient is minimally symptomatic at this time with rate control on diltiazem.  I offered her cardioversion as this is her first episode of atrial fibrillation, but she wants to manage conservatively for now, which is perfectly appropriate.  If she feels that her exercise tolerance has gone down in atrial fibrillation, we can consider a cardioversion at that time.  I will see her back in 3 months.

## 2010-07-13 NOTE — Progress Notes (Signed)
PCP: Dr. Tawanna Cooler  70 yo with recent onset atrial fibrillation returns for cardiology evaluation.  Patient has had prior history of PACs, but atrial fibrillation was noted for the first time on 05/13/10.  She says that she noted her heart rate running a bit high prior to her visit to Dr. 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  She had a regular visit with Dr. Tawanna Cooler on 4/23 and was noted to be in atrial fibrillation. She was still in atrial fibrillation when I saw her in the office last month and she is in atrial fibrillation today.  Prior to 4/23, she was never noted to have atrial fibrillation, but she did have an episode in 12/11 where her resting heart rate was >100 and she was not allowed to donate blood. She has not noted any exercise limitation.  She walks, hikes (6-8 miles up and down hills), and occasionally bikes for exercise.  No exertional dyspnea.  No chest pain.  No lightheadedness or syncope. She is now taking diltiazem CD and Xarelto.    I had her do an echo.  This showed EF 55%, normal valves, moderate biatrial enlargement, and normal RV.    ECG (05/13/10): Atrial fibrillation, rate 126 ECG: (05/21/10): Atrial fibrillation, rate 91 ECG: (6/12): Atrial fibrillat, left axis deviation, poor anterior R wave progression, rate 95  Labs (4/12): TSH normal, LFTs normal, HCT 40, K 4.2, creatinine 0.9, LDL 90, HDL  PMH:  1. Atrial fibrillation: First noted on 05/13/10.  Had PACs noted prior to this.  Persistent.  Echo (5/12): EF 55%, normal wall thickness, normal valves, moderate biatrial enlargement, normal RV.  2. Duputren's contractures 3. Osteoarthritis 4. Venous insufficiency 5. Multinodular goiter: Nontoxic.   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  Nonsmoker, rare ETOH.   FH: Mother with atrial fibrillation, CHF, pacemaker, and pancreatic cancer.  Father with lung cancer.    Current Outpatient  Prescriptions  Medication Sig Dispense Refill  . calcium-vitamin D 250-100 MG-UNIT per tablet Take 1 tablet by mouth daily.        Marland Kitchen EPINEPHrine (EPIPEN 2-PAK) 0.3 mg/0.3 mL DEVI Inject 0.3 mg into the muscle once.        Marland Kitchen estradiol (ESTRACE) 0.1 MG/GM vaginal cream Place 2 g vaginally 2 (two) times a week.        . ferrous sulfate 325 (65 FE) MG EC tablet Take 325 mg by mouth 3 (three) times a week.        . Flaxseed, Linseed, (FLAX SEED OIL) 1000 MG CAPS Take 2 capsules by mouth daily.        . Multiple Vitamin (MULTIVITAMIN) tablet Take 1 tablet by mouth daily.        . Rivaroxaban (XARELTO) 20 MG TABS Take 20 mg by mouth daily.  30 tablet  6  . diltiazem (CARDIZEM CD) 180 MG 24 hr capsule Take 1 capsule (180 mg total) by mouth daily.  30 capsule  6    Ht 5' 7.75" (1.721 m)  Wt 132 lb 12.8 oz (60.238 kg)  BMI 20.34 kg/m2 General: NAD Neck: No JVD, no thyromegaly or thyroid nodule.  Lungs: Clear to auscultation bilaterally with normal respiratory effort. CV: Nondisplaced PMI.  Heart irregular S1/S2, no S3/S4, no murmur.  No peripheral edema.  No carotid bruit.  Normal pedal pulses.  Abdomen: Soft, nontender, no hepatosplenomegaly, no distention.  Neurologic: Alert and oriented  x 3.  Psych: Normal affect. Extremities: No clubbing or cyanosis.

## 2010-07-23 NOTE — Progress Notes (Signed)
Addended by: Judithe Modest D on: 07/23/2010 12:24 PM   Modules accepted: Orders

## 2010-08-13 ENCOUNTER — Other Ambulatory Visit: Payer: Self-pay | Admitting: Family Medicine

## 2010-08-13 DIAGNOSIS — Z1231 Encounter for screening mammogram for malignant neoplasm of breast: Secondary | ICD-10-CM

## 2010-09-18 ENCOUNTER — Ambulatory Visit
Admission: RE | Admit: 2010-09-18 | Discharge: 2010-09-18 | Disposition: A | Discharge status: Home / Self Care | Payer: BC Managed Care – PPO | Source: Ambulatory Visit | Attending: Family Medicine | Admitting: Family Medicine

## 2010-09-18 DIAGNOSIS — Z1231 Encounter for screening mammogram for malignant neoplasm of breast: Secondary | ICD-10-CM

## 2010-10-01 ENCOUNTER — Other Ambulatory Visit: Payer: BC Managed Care – PPO

## 2010-10-02 ENCOUNTER — Ambulatory Visit: Payer: BC Managed Care – PPO

## 2010-10-02 DIAGNOSIS — E042 Nontoxic multinodular goiter: Secondary | ICD-10-CM

## 2010-10-08 ENCOUNTER — Ambulatory Visit: Payer: BC Managed Care – PPO | Admitting: Family Medicine

## 2010-10-16 ENCOUNTER — Encounter: Payer: Self-pay | Admitting: Cardiology

## 2010-10-16 ENCOUNTER — Ambulatory Visit (INDEPENDENT_AMBULATORY_CARE_PROVIDER_SITE_OTHER): Payer: BC Managed Care – PPO | Admitting: Cardiology

## 2010-10-16 VITALS — BP 110/72 | HR 88 | Ht 67.0 in | Wt 139.0 lb

## 2010-10-16 DIAGNOSIS — I4891 Unspecified atrial fibrillation: Secondary | ICD-10-CM

## 2010-10-16 MED ORDER — DILTIAZEM HCL ER COATED BEADS 180 MG PO CP24: 180.0000 mg | ORAL_CAPSULE | Freq: Every day | ORAL | Status: DC

## 2010-10-16 MED ORDER — RIVAROXABAN 20 MG PO TABS: 20.0000 mg | ORAL_TABLET | Freq: Every day | ORAL | Status: DC

## 2010-10-16 NOTE — Patient Instructions (Signed)
Your physician recommends that you return for lab work tomorrow--BMP/CBC--this is scheduled for the [facility] lab.  Your physician wants you to follow-up in: 6 months with Dr Shirlee Latch.(March 2013).You will receive a reminder letter in the mail two months in advance. If you don't receive a letter, please call our office to schedule the follow-up appointment.

## 2010-10-17 ENCOUNTER — Other Ambulatory Visit: Payer: BC Managed Care – PPO

## 2010-10-17 NOTE — Progress Notes (Signed)
PCP: Dr. Tawanna Cooler  70 yo with recent onset atrial fibrillation returns for cardiology evaluation.  Patient has had prior history of PACs, but atrial fibrillation was noted for the first time on 05/13/10.  She says that she noted her heart rate running a bit high prior to her visit to Dr. 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  She had a regular visit with Dr. Tawanna Cooler on 4/23 and was noted to be in atrial fibrillation. She seems to have remained in atrial fibrillation since that time.  Prior to 4/23, she was never noted to have atrial fibrillation, but she did have an episode in 12/11 where her resting heart rate was >100 and she was not allowed to donate blood. She has not noted any exercise limitation.  She walks, hikes (6-8 miles up and down hills), and occasionally bikes for exercise.  No exertional dyspnea.  No chest pain.  Exercise tolerance has not changed.  No lightheadedness or syncope. She is now taking diltiazem CD and Xarelto.    ECG (05/13/10): Atrial fibrillation, rate 126 ECG (05/21/10): Atrial fibrillation, rate 91 ECG (6/12): Atrial fibrillation, left axis deviation, poor anterior R wave progression, rate 95 ECG (9/12): atrial fibrillation, rate 80  Labs (4/12): TSH normal, LFTs normal, HCT 40, K 4.2, creatinine 0.9, LDL 90, HDL Labs (9/12): TSH, free T4, fre T3 all normal  PMH:  1. Atrial fibrillation: First noted on 05/13/10.  Had PACs noted prior to this.  Persistent.  Echo (5/12): EF 55%, normal wall thickness, normal valves, moderate biatrial enlargement, normal RV.  2. Duputren's contractures 3. Osteoarthritis 4. Venous insufficiency 5. Multinodular goiter: Nontoxic.   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  Nonsmoker, rare ETOH.   FH: Mother with atrial fibrillation, CHF, pacemaker, and pancreatic cancer.  Father with lung cancer.    Current Outpatient Prescriptions  Medication Sig Dispense  Refill  . calcium-vitamin D 250-100 MG-UNIT per tablet Take 1 tablet by mouth daily.        Marland Kitchen diltiazem (CARDIZEM CD) 180 MG 24 hr capsule Take 1 capsule (180 mg total) by mouth daily.  90 capsule  3  . EPINEPHrine (EPIPEN 2-PAK) 0.3 mg/0.3 mL DEVI Inject 0.3 mg into the muscle once.        Marland Kitchen estradiol (ESTRACE) 0.1 MG/GM vaginal cream Place 2 g vaginally 2 (two) times a week.        . ferrous sulfate 325 (65 FE) MG EC tablet Take 325 mg by mouth 3 (three) times a week.        . Flaxseed, Linseed, (FLAX SEED OIL) 1000 MG CAPS Take 2 capsules by mouth daily.        . Multiple Vitamin (MULTIVITAMIN) tablet Take 1 tablet by mouth daily.        . Rivaroxaban (XARELTO) 20 MG TABS Take 20 mg by mouth daily.  90 tablet  3    BP 110/72  Pulse 88  Ht 5\' 7"  (1.702 m)  Wt 139 lb (63.05 kg)  BMI 21.77 kg/m2 General: NAD Neck: No JVD, no thyromegaly or thyroid nodule.  Lungs: Clear to auscultation bilaterally with normal respiratory effort. CV: Nondisplaced PMI.  Heart irregular S1/S2, no S3/S4, no murmur.  No peripheral edema.  No carotid bruit.  Normal pedal pulses.  Abdomen: Soft, nontender, no hepatosplenomegaly, no distention.  Neurologic: Alert and oriented x 3.  Psych: Normal affect. Extremities: No  clubbing or cyanosis.

## 2010-10-17 NOTE — Assessment & Plan Note (Signed)
Patient has minimally symptomatic persistent atrial fibrillation.  CHADSVASC score is 2 (female gender, age > 68).  Her mother had atrial fibrillation.  Echo showed preserved LV function and no significant valvular dysfunction.   - She will continue diltiazem CD and Xarelto.  BMET and CBC today.  - Patient is minimally symptomatic at this time with rate control on diltiazem.  I offered her cardioversion as this is her first episode of atrial fibrillation, but she wants to manage conservatively for now, which is perfectly appropriate. I will see her back in 6 months.

## 2010-10-18 ENCOUNTER — Other Ambulatory Visit (INDEPENDENT_AMBULATORY_CARE_PROVIDER_SITE_OTHER): Payer: BC Managed Care – PPO

## 2010-10-18 DIAGNOSIS — I4891 Unspecified atrial fibrillation: Secondary | ICD-10-CM

## 2010-10-18 LAB — BASIC METABOLIC PANEL
BUN: 19 mg/dL (ref 6–23)
CO2: 29 mEq/L (ref 19–32)
Chloride: 108 mEq/L (ref 96–112)
Creatinine, Ser: 0.9 mg/dL (ref 0.4–1.2)
Glucose, Bld: 89 mg/dL (ref 70–99)

## 2010-10-18 LAB — CBC WITH DIFFERENTIAL/PLATELET
Basophils Relative: 1.1 % (ref 0.0–3.0)
Eosinophils Absolute: 0.1 10*3/uL (ref 0.0–0.7)
MCHC: 32.8 g/dL (ref 30.0–36.0)
MCV: 95.3 fl (ref 78.0–100.0)
Monocytes Absolute: 0.4 10*3/uL (ref 0.1–1.0)
Neutrophils Relative %: 61.9 % (ref 43.0–77.0)
RBC: 4.29 Mil/uL (ref 3.87–5.11)

## 2010-10-21 ENCOUNTER — Encounter: Payer: Self-pay | Admitting: Cardiology

## 2010-12-30 ENCOUNTER — Telehealth: Payer: Self-pay | Admitting: Family Medicine

## 2010-12-30 MED ORDER — HYDROCODONE-HOMATROPINE 5-1.5 MG/5ML PO SYRP: 2.5000 mL | ORAL_SOLUTION | Freq: Four times a day (QID) | ORAL | Status: AC | PRN

## 2010-12-30 NOTE — Telephone Encounter (Signed)
Pt has a head cold and has a heart condition. Pt said that the pharmacist recommend that pt take Tylenol. Pt said that this is not helping with stuffy head and congestion. Pt is wondering what Dr Tawanna Cooler would recommend? OTC or script is fine. [facility].

## 2010-12-30 NOTE — Telephone Encounter (Signed)
Hydromet dispense 4ounces directions one half to 1 teaspoon nightly  p.r.n. For cough, and cold, refills x 1

## 2011-01-02 ENCOUNTER — Telehealth: Payer: Self-pay | Admitting: Family Medicine

## 2011-01-02 NOTE — Telephone Encounter (Signed)
Pt called and has had a bloody nose for 3 days. Pt said that its on the one side of nose that has deviated septum. Pt can not get bleeding to stop.

## 2011-01-02 NOTE — Telephone Encounter (Signed)
Tell her to call Dr. Narda Bonds in medicine.  Throat to be seen today

## 2011-01-02 NOTE — Telephone Encounter (Signed)
Spoke with patient.

## 2011-08-25 ENCOUNTER — Telehealth: Payer: Self-pay | Admitting: Family Medicine

## 2011-08-25 DIAGNOSIS — N959 Unspecified menopausal and perimenopausal disorder: Secondary | ICD-10-CM

## 2011-08-25 DIAGNOSIS — Z0000 Encounter for general adult medical examination without abnormal findings: Secondary | ICD-10-CM

## 2011-08-25 NOTE — Telephone Encounter (Signed)
Pt is sch for cpx on 11-24-2011. Pt is requesting to go to [facility] for cpx labs week prior. Pt works at [employer]. If ok please put order in system

## 2011-08-25 NOTE — Telephone Encounter (Signed)
Labs ordered.

## 2011-08-26 ENCOUNTER — Other Ambulatory Visit: Payer: Self-pay | Admitting: Family Medicine

## 2011-08-26 DIAGNOSIS — Z1231 Encounter for screening mammogram for malignant neoplasm of breast: Secondary | ICD-10-CM

## 2011-09-29 ENCOUNTER — Ambulatory Visit
Admission: RE | Admit: 2011-09-29 | Discharge: 2011-09-29 | Disposition: A | Discharge status: Home / Self Care | Payer: BC Managed Care – PPO | Source: Ambulatory Visit | Attending: Family Medicine | Admitting: Family Medicine

## 2011-09-29 DIAGNOSIS — Z1231 Encounter for screening mammogram for malignant neoplasm of breast: Secondary | ICD-10-CM

## 2011-11-04 ENCOUNTER — Other Ambulatory Visit: Payer: Self-pay | Admitting: Cardiology

## 2011-11-17 ENCOUNTER — Other Ambulatory Visit (INDEPENDENT_AMBULATORY_CARE_PROVIDER_SITE_OTHER): Payer: BC Managed Care – PPO

## 2011-11-17 DIAGNOSIS — N959 Unspecified menopausal and perimenopausal disorder: Secondary | ICD-10-CM

## 2011-11-17 DIAGNOSIS — Z0000 Encounter for general adult medical examination without abnormal findings: Secondary | ICD-10-CM

## 2011-11-17 LAB — CBC
HCT: 40 % (ref 36.0–46.0)
Hemoglobin: 13 g/dL (ref 12.0–15.0)
MCHC: 32.6 g/dL (ref 30.0–36.0)
RDW: 13.8 % (ref 11.5–14.6)

## 2011-11-17 LAB — BASIC METABOLIC PANEL
BUN: 21 mg/dL (ref 6–23)
Creatinine, Ser: 1 mg/dL (ref 0.4–1.2)
GFR: 61.5 mL/min (ref >60.00)
Potassium: 4.7 mEq/L (ref 3.5–5.1)

## 2011-11-17 LAB — URINALYSIS
Specific Gravity, Urine: 1.01 (ref 1.000–1.030)
Total Protein, Urine: NEGATIVE
Urine Glucose: NEGATIVE

## 2011-11-17 LAB — LIPID PANEL
Cholesterol: 194 mg/dL (ref 0–200)
VLDL: 4.8 mg/dL (ref 0.0–40.0)

## 2011-11-17 LAB — HEPATIC FUNCTION PANEL
ALT: 23 U/L (ref 0–35)
AST: 29 U/L (ref 0–37)
Alkaline Phosphatase: 42 U/L (ref 39–117)
Bilirubin, Direct: 0.1 mg/dL (ref 0.0–0.3)
Total Bilirubin: 0.7 mg/dL (ref 0.3–1.2)

## 2011-11-24 ENCOUNTER — Ambulatory Visit (INDEPENDENT_AMBULATORY_CARE_PROVIDER_SITE_OTHER): Payer: BC Managed Care – PPO | Admitting: Family Medicine

## 2011-11-24 ENCOUNTER — Encounter: Payer: Self-pay | Admitting: Family Medicine

## 2011-11-24 VITALS — BP 110/68 | Temp 98.2°F | Ht 68.0 in | Wt 138.0 lb

## 2011-11-24 DIAGNOSIS — N959 Unspecified menopausal and perimenopausal disorder: Secondary | ICD-10-CM

## 2011-11-24 DIAGNOSIS — Z01419 Encounter for gynecological examination (general) (routine) without abnormal findings: Secondary | ICD-10-CM

## 2011-11-24 DIAGNOSIS — Z23 Encounter for immunization: Secondary | ICD-10-CM

## 2011-11-24 DIAGNOSIS — Z0000 Encounter for general adult medical examination without abnormal findings: Secondary | ICD-10-CM

## 2011-11-24 DIAGNOSIS — T63441A Toxic effect of venom of bees, accidental (unintentional), initial encounter: Secondary | ICD-10-CM

## 2011-11-24 DIAGNOSIS — I4891 Unspecified atrial fibrillation: Secondary | ICD-10-CM

## 2011-11-24 MED ORDER — ESTRADIOL 0.1 MG/GM VA CREA: 2.0000 g | TOPICAL_CREAM | VAGINAL | Status: DC

## 2011-11-24 MED ORDER — RIVAROXABAN 20 MG PO TABS: 20.0000 mg | ORAL_TABLET | Freq: Every day | ORAL | Status: DC

## 2011-11-24 MED ORDER — DILTIAZEM HCL ER COATED BEADS 180 MG PO CP24: 180.0000 mg | ORAL_CAPSULE | Freq: Every day | ORAL | Status: DC

## 2011-11-24 MED ORDER — EPINEPHRINE 0.3 MG/0.3ML IJ DEVI: 0.3000 mg | Freq: Once | INTRAMUSCULAR | Status: DC

## 2011-11-24 NOTE — Patient Instructions (Signed)
Continue your current medications  Call and arrange a followup cardiac consult with Dr. Sherlie Ban  Return in one year sooner if any problems

## 2011-11-24 NOTE — Progress Notes (Signed)
  Subjective:    Patient ID: Stephanie Shaw, female    DOB: 03-09-1940, 71 y.o.   MRN: [id number]  HPI Stephanie Shaw is a delightful 29 year old divorced female nonsmoker retired Editor, commissioning who comes in today for general physical examination and a Medicare wellness exam  She takes diltiazem 180 mg daily along with xeralto because of A. fib. Heart rate varies between 70 and 80. She's asymptomatic.  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  Cognitive function normal she walks on a regular basis home health safety reviewed no issues identified, no guns in the house, she does have a health care power of attorney and living well   Review of Systems  Constitutional: Negative.   HENT: Negative.   Eyes: Negative.   Respiratory: Negative.   Cardiovascular: Negative.   Gastrointestinal: Negative.   Genitourinary: Negative.   Musculoskeletal: Negative.   Neurological: Negative.   Hematological: Negative.   Psychiatric/Behavioral: Negative.        Objective:   Physical Exam  Constitutional: She appears well-developed and well-nourished.  HENT:  Head: Normocephalic and atraumatic.  Right Ear: External ear normal.  Left Ear: External ear normal.  Nose: Nose normal.  Mouth/Throat: Oropharynx is clear and moist.  Eyes: EOM are normal. Pupils are equal, round, and reactive to light.  Neck: Normal range of motion. Neck supple. No thyromegaly present.       No carotid bruits  Cardiovascular: Regular rhythm, normal heart sounds and intact distal pulses.  Exam reveals no gallop and no friction rub.   No murmur heard.      Irregularly irregular,,,,,,, 70-80,,,  Pulmonary/Chest: Effort normal and breath sounds normal.  Abdominal:  Soft. Bowel sounds are normal. She exhibits no distension and no mass. There is no tenderness. There is no rebound.  Genitourinary: Vagina normal and uterus normal. Guaiac negative stool. No vaginal discharge found.  Musculoskeletal: Normal range of motion.  Lymphadenopathy:    She has no cervical adenopathy.  Neurological: She is alert. She has normal reflexes. No cranial nerve deficit. She exhibits normal muscle tone. Coordination normal.  Skin: Skin is warm and dry.       Total body skin exam normal  Psychiatric: She has a normal mood and affect. Her behavior is normal. Judgment and thought content normal.          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

## 2012-01-05 ENCOUNTER — Other Ambulatory Visit: Payer: Self-pay | Admitting: Cardiology

## 2012-01-07 ENCOUNTER — Encounter: Payer: Self-pay | Admitting: Cardiology

## 2012-01-07 ENCOUNTER — Ambulatory Visit (INDEPENDENT_AMBULATORY_CARE_PROVIDER_SITE_OTHER): Payer: BC Managed Care – PPO | Admitting: Cardiology

## 2012-01-07 VITALS — BP 104/77 | HR 63 | Ht 68.0 in | Wt 137.0 lb

## 2012-01-07 DIAGNOSIS — I4891 Unspecified atrial fibrillation: Secondary | ICD-10-CM

## 2012-01-07 NOTE — Patient Instructions (Addendum)
Your physician recommends that you return for lab work--BMET/CBCd. This is scheduled for April 15,2014. Lab hours are 7:30am-5pm.   Your physician wants you to follow-up in: 1 year with Dr Shirlee Latch. (December 2014). You will receive a reminder letter in the mail two months in advance. If you don't receive a letter, please call our office to schedule the follow-up appointment.

## 2012-01-09 NOTE — Progress Notes (Signed)
Patient ID: Stephanie Shaw, female   DOB: 04-28-1940, 71 y.o.   MRN: [id number] PCP: Dr. Tawanna Cooler  71 yo with chronic atrial fibrillation returns for cardiology evaluation.  Patient has had prior history of PACs, but atrial fibrillation was noted for the first time on 4/12.  She seems to have remained in atrial fibrillation since that time.  She walks, hikes (6-8 miles up and down hills), and occasionally bikes for exercise.  No exertional dyspnea.  No chest pain.  Exercise tolerance has not changed.  No lightheadedness or syncope. No stroke-like symptoms.  No palpitations.  No falls or overt GI bleeding.  She is now taking diltiazem CD and Xarelto.    ECG (05/13/10): Atrial fibrillation, rate 126 ECG (05/21/10): Atrial fibrillation, rate 91 ECG (6/12): Atrial fibrillation, left axis deviation, poor anterior R wave progression, rate 95 ECG (9/12): atrial fibrillation, rate 80  Labs (4/12): TSH normal, LFTs normal, HCT 40, K 4.2, creatinine 0.9, LDL 90, HDL Labs (9/12): TSH, free T4, fre T3 all normal Labs (10/13): K 4.7, creatinine 1.0, HCT 40, LDL 94  PMH:  1. Atrial fibrillation: First noted on 05/13/10.  Had PACs noted prior to this.  Persistent.  Echo (5/12): EF 55%, normal wall thickness, normal valves, moderate biatrial enlargement, normal RV.  2. Duputren's contractures 3. Osteoarthritis 4. Venous insufficiency 5. Multinodular goiter: Nontoxic.   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  Nonsmoker, rare ETOH.   FH: Mother with atrial fibrillation, CHF, pacemaker, and pancreatic cancer.  Father with lung cancer.    Current Outpatient Prescriptions  Medication Sig Dispense Refill  . calcium-vitamin D 250-100 MG-UNIT per tablet Take 1 tablet by mouth daily.        Marland Kitchen diltiazem (CARDIZEM CD) 180 MG 24 hr capsule Take 1 capsule (180 mg total) by mouth daily.  90 capsule  3  . EPINEPHrine (EPIPEN 2-PAK) 0.3 mg/0.3 mL DEVI Inject 0.3 mLs (0.3 mg total) into the muscle  once.  1 Device  1  . estradiol (ESTRACE) 0.1 MG/GM vaginal cream Place 0.25 Applicatorfuls vaginally 2 (two) times a week.  42.5 g  11  . ferrous sulfate 325 (65 FE) MG EC tablet Take 325 mg by mouth 3 (three) times a week.       . Flaxseed, Linseed, (FLAX SEED OIL) 1000 MG CAPS Take 2 capsules by mouth daily.        . Multiple Vitamin (MULTIVITAMIN) tablet Take 1 tablet by mouth daily.        Carlena Hurl 20 MG TABS TAKE 1 TABLET BY MOUTH EVERY DAY  90 tablet  1    BP 104/77  Pulse 63  Ht 5\' 8"  (1.727 m)  Wt 137 lb (62.143 kg)  BMI 20.83 kg/m2 General: NAD Neck: No JVD, no thyromegaly or thyroid nodule.  Lungs: Clear to auscultation bilaterally with normal respiratory effort. CV: Nondisplaced PMI.  Heart irregular S1/S2, no S3/S4, no murmur.  No peripheral edema.  No carotid bruit.  Normal pedal pulses.  Abdomen: Soft, nontender, no hepatosplenomegaly, no distention.  Neurologic: Alert and oriented x 3.  Psych: Normal affect. Extremities: No clubbing or cyanosis.   Assessment/Plan:  Atrial fibrillation   Patient has minimally symptomatic persistent atrial fibrillation. CHADSVASC score is 2 (female gender, age > 14). Her mother had atrial fibrillation. Echo showed preserved LV function and no significant valvular dysfunction.  - She will continue diltiazem CD and Xarelto. BMET and CBC in 4/13.  - Patient is  minimally symptomatic at this time with rate control on diltiazem. In the past, I offered her cardioversion, but she wanted to manage conservatively, which is perfectly appropriate. I will see her back in 6 months.   Marca Ancona 01/09/2012

## 2012-04-09 ENCOUNTER — Telehealth: Payer: Self-pay | Admitting: Family Medicine

## 2012-04-09 NOTE — Telephone Encounter (Signed)
Patient Information:  Caller Name: Akeila  Phone: [phone]  Patient: Stephanie Shaw  Gender: Female  DOB: [birth year]  Age: 72 Years  PCP: K[facility])  Office Follow Up:  Does the office need to follow up with this patient?: No  Instructions For The Office: N/A  RN Note:  Redness around injured area, scabbed over, size is 3/4 inch wide and 1 1/2 inch tall, no drainage noted.  Symptoms  Reason For Call & Symptoms: Rt knee abrasion  Reviewed Health History In EMR: Yes  Reviewed Medications In EMR: Yes  Reviewed Allergies In EMR: Yes  Reviewed Surgeries / Procedures: Yes  Date of Onset of Symptoms: 04/02/2012  Treatments Tried: Neosporin, non stick adhesive pads,  Treatments Tried Worked: No  Guideline(s) Used:  Knee Injury  Leg Injury  Wound Infection  Disposition Per Guideline:   Home Care  Reason For Disposition Reached:   Wound doesn't sound infected, or only mild redness  Advice Given:  Antibiotic Ointment:  Apply an antibiotic ointment 3 times a day. If the area could become dirty, cover with a Band-Aid or a clean gauze dressing.  Expected Course:  Pain and swelling normally peak on day 2. Any redness should go away by day 3 or 4. Complete healing should occur by day 10.  Call Back If:   Wound becomes more tender  Redness starts to spread  Pus, drainage, or fever occurs  You become worse  Patient Will Follow Care Advice:  YES

## 2012-04-12 ENCOUNTER — Ambulatory Visit (INDEPENDENT_AMBULATORY_CARE_PROVIDER_SITE_OTHER): Payer: BC Managed Care – PPO | Admitting: Internal Medicine

## 2012-04-12 ENCOUNTER — Encounter: Payer: Self-pay | Admitting: Internal Medicine

## 2012-04-12 VITALS — BP 110/70 | HR 98 | Temp 98.3°F | Resp 18

## 2012-04-12 DIAGNOSIS — I4891 Unspecified atrial fibrillation: Secondary | ICD-10-CM

## 2012-04-12 DIAGNOSIS — S81809A Unspecified open wound, unspecified lower leg, initial encounter: Secondary | ICD-10-CM

## 2012-04-12 DIAGNOSIS — S81801A Unspecified open wound, right lower leg, initial encounter: Secondary | ICD-10-CM

## 2012-04-12 NOTE — Progress Notes (Signed)
Subjective:    Patient ID: Stephanie Shaw, female    DOB: 1940/11/09, 72 y.o.   MRN: [id number]  HPI  72 year old patient who has a history of atrial for ablation and is on chronic anticoagulation. She fell forward 10 days ago sustaining trauma to both knees. She was concerned about a nonhealing wound involving her right knee area.  Past Medical History  Diagnosis Date  . DEGENERATIVE JOINT DISEASE, RIGHT HIP 02/16/2007  . POSTMENOPAUSAL SYNDROME 02/16/2007  . PREMATURE ATRIAL CONTRACTIONS 02/16/2007  . Toxic effect of venom 07/27/2007  . Venous insufficiency   . Dupuytren's contracture   . Osteoarthritis of left knee   . Osteoarthritis of hip     right  . S/P breast biopsy, left     two o'clock position - benign    History   Social History  . Marital Status: Divorced    Spouse Name: N/A    Number of Children: N/A  . Years of Education: N/A   Occupational History  . Not on file.   Social History Main Topics  . Smoking status: Former Games developer  . Smokeless tobacco: Not on file  . Alcohol Use: No  . Drug Use: Not on file  . Sexually Active: Not on file   Other Topics Concern  . Not on file   Social History Narrative  . No narrative on file    History reviewed. No pertinent past surgical history.  Family History  Problem Relation Age of Onset  . Cancer Father   . Alcohol abuse Father   . Cancer Mother   . Diabetes Mother   . Heart failure Mother   . Osteoarthritis Sister     Allergies  Allergen Reactions  . Sulfonamide Derivatives     REACTION: HIVES    Current Outpatient Prescriptions on File Prior to Visit  Medication Sig Dispense Refill  . calcium-vitamin D 250-100 MG-UNIT per tablet Take 1 tablet by mouth daily.        Marland Kitchen diltiazem (CARDIZEM CD) 180 MG 24 hr capsule Take 1 capsule (180 mg total) by mouth daily.  90 capsule  3  . EPINEPHrine (EPIPEN 2-PAK) 0.3 mg/0.3 mL DEVI Inject 0.3 mLs (0.3 mg total) into the muscle once.  1 Device  1  . estradiol  (ESTRACE) 0.1 MG/GM vaginal cream Place 0.25 Applicatorfuls vaginally 2 (two) times a week.  42.5 g  11  . ferrous sulfate 325 (65 FE) MG EC tablet Take 325 mg by mouth 3 (three) times a week.       . Flaxseed, Linseed, (FLAX SEED OIL) 1000 MG CAPS Take 2 capsules by mouth daily.        . Multiple Vitamin (MULTIVITAMIN) tablet Take 1 tablet by mouth daily.        Carlena Hurl 20 MG TABS TAKE 1 TABLET BY MOUTH EVERY DAY  90 tablet  1   No current facility-administered medications on file prior to visit.    BP 110/70  Pulse 98  Temp(Src) 98.3 F (36.8 C) (Oral)  Resp 18  SpO2 97%       Review of Systems  Skin: Positive for wound.       Objective:   Physical Exam  Constitutional: She appears well-developed and well-nourished. No distress.  Afebrile  Skin:  1.5 x 3.0 cm crusted lesion just distal to the right patella. Lesion was clean without any erythema or drainage  Ecchymoses were noted involving left lower leg  Assessment & Plan:   Wound the right leg Traumatic ecchymoses  We'll continue local wound care. There are no signs of infection. Will call if there is any clinical change

## 2012-04-12 NOTE — Patient Instructions (Signed)
Wash the wound once daily and leave open to air  Call if there is any worsening pain erythema or drainage or if you  Develop  fever

## 2012-05-18 ENCOUNTER — Other Ambulatory Visit (INDEPENDENT_AMBULATORY_CARE_PROVIDER_SITE_OTHER): Payer: BC Managed Care – PPO

## 2012-05-18 DIAGNOSIS — I4891 Unspecified atrial fibrillation: Secondary | ICD-10-CM

## 2012-05-19 LAB — BASIC METABOLIC PANEL
BUN: 16 mg/dL (ref 6–23)
CO2: 29 mEq/L (ref 19–32)
Chloride: 104 mEq/L (ref 96–112)
Glucose, Bld: 73 mg/dL (ref 70–99)
Potassium: 4.3 mEq/L (ref 3.5–5.1)

## 2012-05-19 LAB — CBC WITH DIFFERENTIAL/PLATELET
Basophils Relative: 0.3 % (ref 0.0–3.0)
Eosinophils Relative: 1.7 % (ref 0.0–5.0)
Lymphocytes Relative: 22.4 % (ref 12.0–46.0)
MCV: 93.4 fl (ref 78.0–100.0)
Monocytes Absolute: 0.6 10*3/uL (ref 0.1–1.0)
Monocytes Relative: 9.9 % (ref 3.0–12.0)
Neutrophils Relative %: 65.7 % (ref 43.0–77.0)
RBC: 4.11 Mil/uL (ref 3.87–5.11)
WBC: 5.7 10*3/uL (ref 4.5–10.5)

## 2012-07-01 ENCOUNTER — Other Ambulatory Visit: Payer: Self-pay | Admitting: Cardiology

## 2012-07-03 ENCOUNTER — Other Ambulatory Visit: Payer: Self-pay | Admitting: Cardiology

## 2012-09-02 ENCOUNTER — Other Ambulatory Visit: Payer: Self-pay

## 2012-09-02 DIAGNOSIS — Z1231 Encounter for screening mammogram for malignant neoplasm of breast: Secondary | ICD-10-CM

## 2012-09-30 ENCOUNTER — Other Ambulatory Visit: Payer: Self-pay | Admitting: Cardiology

## 2012-09-30 ENCOUNTER — Ambulatory Visit
Admission: RE | Admit: 2012-09-30 | Discharge: 2012-09-30 | Disposition: A | Discharge status: Home / Self Care | Payer: BC Managed Care – PPO | Source: Ambulatory Visit

## 2012-09-30 DIAGNOSIS — Z1231 Encounter for screening mammogram for malignant neoplasm of breast: Secondary | ICD-10-CM

## 2012-12-27 ENCOUNTER — Other Ambulatory Visit: Payer: Self-pay | Admitting: Cardiology

## 2013-01-25 ENCOUNTER — Other Ambulatory Visit (INDEPENDENT_AMBULATORY_CARE_PROVIDER_SITE_OTHER): Payer: BC Managed Care – PPO

## 2013-01-25 DIAGNOSIS — Z0000 Encounter for general adult medical examination without abnormal findings: Secondary | ICD-10-CM

## 2013-01-25 LAB — CBC WITH DIFFERENTIAL/PLATELET
BASOS PCT: 0.3 % (ref 0.0–3.0)
Basophils Absolute: 0 10*3/uL (ref 0.0–0.1)
EOS ABS: 0.1 10*3/uL (ref 0.0–0.7)
Eosinophils Relative: 2.2 % (ref 0.0–5.0)
HCT: 40.3 % (ref 36.0–46.0)
Hemoglobin: 13.4 g/dL (ref 12.0–15.0)
LYMPHS PCT: 17.1 % (ref 12.0–46.0)
Lymphs Abs: 0.9 10*3/uL (ref 0.7–4.0)
MCHC: 33.2 g/dL (ref 30.0–36.0)
MCV: 93.2 fl (ref 78.0–100.0)
Monocytes Absolute: 0.4 10*3/uL (ref 0.1–1.0)
Monocytes Relative: 8.3 % (ref 3.0–12.0)
NEUTROS PCT: 72.1 % (ref 43.0–77.0)
Neutro Abs: 3.8 10*3/uL (ref 1.4–7.7)
Platelets: 159 10*3/uL (ref 150.0–400.0)
RBC: 4.32 Mil/uL (ref 3.87–5.11)
RDW: 13.7 % (ref 11.5–14.6)
WBC: 5.3 10*3/uL (ref 4.5–10.5)

## 2013-01-25 LAB — LIPID PANEL
CHOL/HDL RATIO: 2
CHOLESTEROL: 215 mg/dL — AB (ref 0–200)
HDL: 99.4 mg/dL (ref >39.00)
TRIGLYCERIDES: 22 mg/dL (ref 0.0–149.0)
VLDL: 4.4 mg/dL (ref 0.0–40.0)

## 2013-01-25 LAB — BASIC METABOLIC PANEL
BUN: 19 mg/dL (ref 6–23)
CHLORIDE: 103 meq/L (ref 96–112)
CO2: 26 mEq/L (ref 19–32)
Calcium: 9.4 mg/dL (ref 8.4–10.5)
Creatinine, Ser: 0.9 mg/dL (ref 0.4–1.2)
GFR: 64.42 mL/min (ref >60.00)
Glucose, Bld: 79 mg/dL (ref 70–99)
POTASSIUM: 3.8 meq/L (ref 3.5–5.1)
SODIUM: 138 meq/L (ref 135–145)

## 2013-01-25 LAB — HEPATIC FUNCTION PANEL
ALBUMIN: 4.5 g/dL (ref 3.5–5.2)
ALK PHOS: 46 U/L (ref 39–117)
ALT: 21 U/L (ref 0–35)
AST: 25 U/L (ref 0–37)
Bilirubin, Direct: 0.1 mg/dL (ref 0.0–0.3)
Total Bilirubin: 0.8 mg/dL (ref 0.3–1.2)
Total Protein: 6.9 g/dL (ref 6.0–8.3)

## 2013-01-25 LAB — POCT URINALYSIS DIPSTICK
Bilirubin, UA: NEGATIVE
GLUCOSE UA: NEGATIVE
Ketones, UA: NEGATIVE
Leukocytes, UA: NEGATIVE
NITRITE UA: NEGATIVE
PH UA: 6.5
PROTEIN UA: NEGATIVE
RBC UA: NEGATIVE
Spec Grav, UA: 1.01
UROBILINOGEN UA: 0.2

## 2013-01-25 LAB — LDL CHOLESTEROL, DIRECT: Direct LDL: 104.2 mg/dL

## 2013-01-25 LAB — TSH: TSH: 0.79 u[IU]/mL (ref 0.35–5.50)

## 2013-01-26 ENCOUNTER — Other Ambulatory Visit: Payer: Self-pay | Admitting: Family Medicine

## 2013-01-31 ENCOUNTER — Encounter: Payer: Self-pay | Admitting: Family Medicine

## 2013-01-31 ENCOUNTER — Ambulatory Visit (INDEPENDENT_AMBULATORY_CARE_PROVIDER_SITE_OTHER): Payer: BC Managed Care – PPO | Admitting: Family Medicine

## 2013-01-31 VITALS — BP 110/80 | Temp 98.3°F | Ht 68.0 in | Wt 137.0 lb

## 2013-01-31 DIAGNOSIS — I4891 Unspecified atrial fibrillation: Secondary | ICD-10-CM

## 2013-01-31 DIAGNOSIS — E042 Nontoxic multinodular goiter: Secondary | ICD-10-CM

## 2013-01-31 DIAGNOSIS — Z23 Encounter for immunization: Secondary | ICD-10-CM

## 2013-01-31 DIAGNOSIS — Z5189 Encounter for other specified aftercare: Secondary | ICD-10-CM

## 2013-01-31 DIAGNOSIS — N959 Unspecified menopausal and perimenopausal disorder: Secondary | ICD-10-CM

## 2013-01-31 DIAGNOSIS — M542 Cervicalgia: Secondary | ICD-10-CM

## 2013-01-31 MED ORDER — EPINEPHRINE 0.3 MG/0.3ML IJ SOAJ: 0.3000 mg | Freq: Once | INTRAMUSCULAR | Status: DC

## 2013-01-31 MED ORDER — RIVAROXABAN 20 MG PO TABS: ORAL_TABLET | ORAL | Status: DC

## 2013-01-31 MED ORDER — ESTRADIOL 0.1 MG/GM VA CREA: 2.0000 g | TOPICAL_CREAM | VAGINAL | Status: DC

## 2013-01-31 MED ORDER — DILTIAZEM HCL ER COATED BEADS 180 MG PO CP24: ORAL_CAPSULE | ORAL | Status: DC

## 2013-01-31 NOTE — Progress Notes (Signed)
   Subjective:    Patient ID: Stephanie Shaw, female    DOB: 13-Aug-1940, 73 y.o.   MRN: [id number]  HPI Stephanie Shaw is a 61 year old divorced female nonsmoker who comes in today for a Medicare wellness examination because of a history of atrial fibrillation, postmenopausal vaginal dryness  She takes diltiazem 180 mg daily and xarelto for chronic atrial fibrillation. She says her last check up and cardiology was in the spring 2014. She feels fine she is asymptomatic her exercise tolerance is normal. She works out about 3-4 days per week. No chest pain or shortness of breath  She's is normal cranial 3 times weekly for vaginal dryness  She gets routine eye care, dental care, BSE sporadically, and you mammography, colonoscopy and GI, vaccinations up-to-date except she's due for Pneumovax 13  Cognitive function normal she exercises on a regular basis home health safety reviewed no issues identified, no guns in the house, she does have a health care power of attorney and living well.  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   Review of Systems  Constitutional: Negative.   HENT: Negative.   Eyes: Negative.   Respiratory: Negative.   Cardiovascular: Negative.   Gastrointestinal: Negative.   Endocrine: Negative.   Genitourinary: Negative.   Musculoskeletal: Negative.   Allergic/Immunologic: Negative.   Neurological: Negative.   Hematological: Negative.   Psychiatric/Behavioral: Negative.        Objective:   Physical Exam  Nursing note and vitals reviewed. Constitutional: She appears well-developed and well-nourished.  HENT:  Head: Normocephalic and atraumatic.  Right Ear: External ear normal.  Left Ear: External ear normal.  Nose: Nose normal.  Mouth/Throat: Oropharynx is clear and moist.  Eyes: EOM are normal. Pupils are equal, round, and reactive to light.  Neck: Normal range of motion. Neck supple. No JVD present. No tracheal deviation present. No thyromegaly present.    Thyroid symmetrically enlarged unchanged from previous years  Cardiovascular: Normal rate, regular rhythm, normal heart sounds and intact distal pulses.  Exam reveals no gallop and no friction rub.   No murmur heard. No carotid aortic bruits peripheral pulses 2+ and symmetrical  Pulmonary/Chest: Effort normal and breath sounds normal. No stridor.  Abdominal: Soft. Bowel sounds are normal. She exhibits no distension and no mass. There is no tenderness. There is no rebound.  Genitourinary:  Bilateral breast exam normal  Musculoskeletal: Normal range of motion.  Lymphadenopathy:    She has no cervical adenopathy.  Neurological: She is alert. She has normal reflexes. No cranial nerve deficit. She exhibits normal muscle tone. Coordination normal.  Skin: Skin is warm and dry.  Total body skin exam normal  Psychiatric: She has a normal mood and affect. Her behavior is normal. Judgment and thought content normal.          Assessment & Plan:  Healthy female  History of atrial fib with controlled rate........ 70-80.... continue current medication  Postmenopausal vaginal dryness continue hormonal cream 3 times weekly

## 2013-01-31 NOTE — Patient Instructions (Signed)
Continue your current medications  Continue your good exercise habits  Return in one year sooner if any problems

## 2013-01-31 NOTE — Progress Notes (Signed)
Pre visit review using our clinic review tool, if applicable. No additional management support is needed unless otherwise documented below in the visit note. 

## 2013-03-29 ENCOUNTER — Other Ambulatory Visit: Payer: Self-pay | Admitting: Cardiology

## 2013-08-31 ENCOUNTER — Other Ambulatory Visit: Payer: Self-pay

## 2013-08-31 DIAGNOSIS — Z1231 Encounter for screening mammogram for malignant neoplasm of breast: Secondary | ICD-10-CM

## 2013-10-04 ENCOUNTER — Encounter (INDEPENDENT_AMBULATORY_CARE_PROVIDER_SITE_OTHER): Payer: Self-pay

## 2013-10-04 ENCOUNTER — Ambulatory Visit
Admission: RE | Admit: 2013-10-04 | Discharge: 2013-10-04 | Disposition: A | Discharge status: Home / Self Care | Payer: BC Managed Care – PPO | Source: Ambulatory Visit

## 2013-10-04 DIAGNOSIS — Z1231 Encounter for screening mammogram for malignant neoplasm of breast: Secondary | ICD-10-CM

## 2014-01-11 ENCOUNTER — Other Ambulatory Visit: Payer: Self-pay | Admitting: Family Medicine

## 2014-02-03 ENCOUNTER — Telehealth: Payer: Self-pay | Admitting: Family Medicine

## 2014-02-03 NOTE — Telephone Encounter (Signed)
[facility] - Client [facility] Patient Name: Stephanie Shaw DOB: August 31, 1940 Initial Comment Caller states she has blood in her urine after taking her medicatrion Nurse Assessment Nurse: Markus Daft, RN, [place] Date/Time (Estill Time): 02/03/2014 3:55:47 PM Confirm and document reason for call. If symptomatic, describe symptoms. ---Caller states she has blood in her urine after taking her Alen Blew (been on this for several years d/t atrial fibrillation). Urine is pink tinged. No blood on underwear noticed or when she wipes. First noticed yesterday. Denies pain/burning with urination. No fever. Has the patient traveled out of the country within the last 30 days? ---Not Applicable Does the patient require triage? ---Yes Related visit to physician within the last 2 weeks? ---No Does the PT have any chronic conditions? (i.e. diabetes, asthma, etc.) ---Yes List chronic conditions. ---Atrial Fibrillation Guidelines Guideline Title Affirmed Question Affirmed Notes Urine - Blood In Taking Coumadin (warfarin) or other strong blood thinner, or known bleeding disorder (e.g., thrombocytopenia) Alen Blew Final Disposition User See Physician within 4 Hours (or PCP triage) Markus Daft, RN, [place] refused to go to [facility] or ER now. She wants appt for tomorrow AM. (spoke with Alcario Drought., charge nurse) and she agreed that we needed to strongly advice she go ahead and be seen and not to make appt at this time. Caller aware and disagrees still and plans to call office anyway to make appt. For tomorrow.

## 2014-02-03 NOTE — Telephone Encounter (Signed)
Pt sch appt with dr tower tomorrow

## 2014-02-03 NOTE — Telephone Encounter (Signed)
noted 

## 2014-02-04 ENCOUNTER — Ambulatory Visit (INDEPENDENT_AMBULATORY_CARE_PROVIDER_SITE_OTHER): Payer: BC Managed Care – PPO | Admitting: Family Medicine

## 2014-02-04 ENCOUNTER — Encounter: Payer: Self-pay | Admitting: Family Medicine

## 2014-02-04 VITALS — BP 124/64 | HR 96 | Temp 97.7°F | Ht 68.0 in | Wt 137.0 lb

## 2014-02-04 DIAGNOSIS — R319 Hematuria, unspecified: Secondary | ICD-10-CM | POA: Insufficient documentation

## 2014-02-04 HISTORY — DX: Hematuria, unspecified: R31.9

## 2014-02-04 LAB — POCT URINALYSIS DIP (MANUAL ENTRY)
BILIRUBIN UA: NEGATIVE
BILIRUBIN UA: NEGATIVE
Glucose, UA: NEGATIVE
Leukocytes, UA: NEGATIVE
NITRITE UA: NEGATIVE
PROTEIN UA: NEGATIVE
Spec Grav, UA: <=1.005
UROBILINOGEN UA: 0.2
pH, UA: 8.5

## 2014-02-04 MED ORDER — CIPROFLOXACIN HCL 250 MG PO TABS: 250.0000 mg | ORAL_TABLET | Freq: Two times a day (BID) | ORAL | Status: DC

## 2014-02-04 NOTE — Patient Instructions (Signed)
I think that you could have a very early urinary tract infection  Drink lots of water  Take the cipro as directed for 3 days  We will update you when the microscopic eval of urine comes back  If symptoms worsen in the meantime please call   You will likely need another urine specimen next week - we will advise further when result returns

## 2014-02-04 NOTE — Progress Notes (Signed)
Pre visit review using our clinic review tool, if applicable. No additional management support is needed unless otherwise documented below in the visit note. 

## 2014-02-04 NOTE — Progress Notes (Signed)
Subjective:    Patient ID: Stephanie Shaw, female    DOB: [birth year], 74 y.o.   MRN: [id number]  HPI Here with hx of blood in urine for several days This comes and goes  Yesterday afternoon - was pink in color  Comes and goes   No burning to urinate  No more frequency than usual  No flank pain  No fever or nausea   On xarelto   Results for orders placed or performed in visit on 02/04/14  POCT urinalysis dipstick  Result Value Ref Range   Color, UA straw    Clarity, UA clear    Glucose, UA neg    Bilirubin, UA negative    Bilirubin, UA negative    Spec Grav, UA <=1.005    Blood, UA large    pH, UA 8.5    Protein Ur, POC negative    Urobilinogen, UA 0.2    Nitrite, UA Negative    Leukocytes, UA Negative      Patient Active Problem List   Diagnosis Date Noted  . 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 Cervical pain (neck) 06/20/2010  . Atrial fibrillation 05/21/2010  . 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 POSTMENOPAUSAL SYNDROME 02/16/2007  . DEGENERATIVE JOINT DISEASE, RIGHT HIP 02/16/2007   Past Medical History  Diagnosis Date  . DEGENERATIVE JOINT DISEASE, RIGHT HIP 02/16/2007  . POSTMENOPAUSAL SYNDROME 02/16/2007  . PREMATURE ATRIAL CONTRACTIONS 02/16/2007  . 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 Venous insufficiency   . Dupuytren's contracture   . Osteoarthritis of left knee   . Osteoarthritis of hip     right  . S/P breast biopsy, left     two o'clock position - benign   No past surgical history on file. History  Substance Use Topics  . Smoking status: Former Research scientist (life sciences)  . Smokeless tobacco: Not on file  . Alcohol Use: No   Family History  Problem Relation Age of Onset  . Cancer Father   . Alcohol abuse Father   . Cancer Mother   . Diabetes Mother   . Heart failure Mother   . Osteoarthritis Sister    Allergies  Allergen Reactions  . Sulfonamide Derivatives     REACTION: HIVES   Current Outpatient Prescriptions on File Prior to Visit    Medication Sig Dispense Refill  . calcium-vitamin D 250-100 MG-UNIT per tablet Take 1 tablet by mouth daily.      Marland Kitchen diltiazem (CARDIZEM CD) 180 MG 24 hr capsule TAKE ONE CAPSULE EVERY DAY 90 capsule 3  . diltiazem (CARDIZEM CD) 180 MG 24 hr capsule TAKE ONE CAPSULE EVERY DAY 90 capsule 0  . EPINEPHrine (EPI-PEN) 0.3 mg/0.3 mL SOAJ injection Inject 0.3 mLs (0.3 mg total) into the muscle once. 2 Device 3  . estradiol (ESTRACE) 0.1 MG/GM vaginal cream Place 1.24 Applicatorfuls vaginally 2 (two) times a week. 42.5 g 11  . ferrous sulfate 325 (65 FE) MG EC tablet Take 325 mg by mouth 2 (two) times a week.     . Flaxseed, Linseed, (FLAX SEED OIL) 1000 MG CAPS Take 2 capsules by mouth daily.      . magnesium 30 MG tablet Take 30 mg by mouth 2 (two) times daily.    . Multiple Vitamin (MULTIVITAMIN) tablet Take 1 tablet by mouth daily.      . Rivaroxaban (XARELTO) 20 MG TABS tablet TAKE 1 TABLET BY MOUTH EVERY DAY 90 tablet 3   No current facility-administered medications on file  prior to visit.    Review of Systems Review of Systems  Constitutional: Negative for fever, appetite change, fatigue and unexpected weight change.  Eyes: Negative for pain and visual disturbance.  Respiratory: Negative for cough and shortness of breath.   Cardiovascular: Negative for cp or palpitations    Gastrointestinal: Negative for nausea, diarrhea and constipation.  Genitourinary: Negative for urgency and pos for baseline  frequency. neg for flank pain  Skin: Negative for pallor or rash   Neurological: Negative for weakness, light-headedness, numbness and headaches.  Hematological: Negative for adenopathy. Does not bruise/bleed easily.  Psychiatric/Behavioral: Negative for dysphoric mood. The patient is not nervous/anxious.         Objective:   Physical Exam  Constitutional: She appears well-developed and well-nourished. No distress.  HENT:  Head: Normocephalic and atraumatic.  Eyes: Conjunctivae and EOM are  normal. Pupils are equal, round, and reactive to light. No scleral icterus.  Neck: Normal range of motion. Neck supple.  Cardiovascular: Normal rate.   Pulmonary/Chest: Effort normal and breath sounds normal. No respiratory distress. She has no wheezes. She has no rales.  Abdominal: Soft. Bowel sounds are normal. She exhibits no distension. There is no tenderness.  No suprapubic tenderness or fullness   No cva tenderness   Lymphadenopathy:    She has no cervical adenopathy.  Neurological: She is alert.  Skin: Skin is warm and dry.  No excessive bruising   Psychiatric: She has a normal mood and affect.          Assessment & Plan:   Problem List Items Addressed This Visit      Other   Hematuria - Primary    Microscopic without other symptoms - in pt on xarelto who has had utis in the past  Enc fluid intake  Will send urine for micro exam -pending  Suspect poss early uti  Will tx for 3 d with cipro and update when micro returns   Pt will call if symptoms worsen      Relevant Medications   ciprofloxacin (CIPRO) tablet   Other Relevant Orders   Urinalysis, microscopic only   Urine culture

## 2014-02-04 NOTE — Assessment & Plan Note (Signed)
Microscopic without other symptoms - in pt on xarelto who has had utis in the past  Enc fluid intake  Will send urine for micro exam -pending  Suspect poss early uti  Will tx for 3 d with cipro and update when micro returns   Pt will call if symptoms worsen

## 2014-03-09 ENCOUNTER — Other Ambulatory Visit (INDEPENDENT_AMBULATORY_CARE_PROVIDER_SITE_OTHER): Payer: BC Managed Care – PPO

## 2014-03-09 DIAGNOSIS — Z0000 Encounter for general adult medical examination without abnormal findings: Secondary | ICD-10-CM

## 2014-03-09 LAB — CBC WITH DIFFERENTIAL/PLATELET
BASOS ABS: 0.1 10*3/uL (ref 0.0–0.1)
Basophils Relative: 0.9 % (ref 0.0–3.0)
Eosinophils Absolute: 0.1 10*3/uL (ref 0.0–0.7)
Eosinophils Relative: 1.8 % (ref 0.0–5.0)
HCT: 39.1 % (ref 36.0–46.0)
Hemoglobin: 13.1 g/dL (ref 12.0–15.0)
LYMPHS ABS: 1.3 10*3/uL (ref 0.7–4.0)
Lymphocytes Relative: 22 % (ref 12.0–46.0)
MCHC: 33.5 g/dL (ref 30.0–36.0)
MCV: 92.8 fl (ref 78.0–100.0)
MONOS PCT: 8.4 % (ref 3.0–12.0)
Monocytes Absolute: 0.5 10*3/uL (ref 0.1–1.0)
NEUTROS PCT: 66.9 % (ref 43.0–77.0)
Neutro Abs: 3.9 10*3/uL (ref 1.4–7.7)
PLATELETS: 185 10*3/uL (ref 150.0–400.0)
RBC: 4.21 Mil/uL (ref 3.87–5.11)
RDW: 14.2 % (ref 11.5–15.5)
WBC: 5.9 10*3/uL (ref 4.0–10.5)

## 2014-03-09 LAB — LIPID PANEL
CHOL/HDL RATIO: 2
Cholesterol: 211 mg/dL — ABNORMAL HIGH (ref 0–200)
HDL: 91 mg/dL (ref >39.00)
LDL Cholesterol: 114 mg/dL — ABNORMAL HIGH (ref 0–99)
NonHDL: 120
Triglycerides: 32 mg/dL (ref 0.0–149.0)
VLDL: 6.4 mg/dL (ref 0.0–40.0)

## 2014-03-09 LAB — TSH: TSH: 0.8 u[IU]/mL (ref 0.35–4.50)

## 2014-03-09 LAB — POCT URINALYSIS DIP (MANUAL ENTRY)
Bilirubin, UA: NEGATIVE
Glucose, UA: NEGATIVE
Ketones, POC UA: NEGATIVE
Leukocytes, UA: NEGATIVE
NITRITE UA: NEGATIVE
PH UA: 7
PROTEIN UA: NEGATIVE
RBC UA: NEGATIVE
Spec Grav, UA: 1.015
Urobilinogen, UA: 0.2

## 2014-03-09 LAB — BASIC METABOLIC PANEL
BUN: 18 mg/dL (ref 6–23)
CALCIUM: 9.6 mg/dL (ref 8.4–10.5)
CO2: 30 meq/L (ref 19–32)
CREATININE: 0.88 mg/dL (ref 0.40–1.20)
Chloride: 103 mEq/L (ref 96–112)
GFR: 66.75 mL/min (ref >60.00)
Glucose, Bld: 87 mg/dL (ref 70–99)
Potassium: 3.8 mEq/L (ref 3.5–5.1)
Sodium: 137 mEq/L (ref 135–145)

## 2014-03-09 LAB — HEPATIC FUNCTION PANEL
ALK PHOS: 45 U/L (ref 39–117)
ALT: 16 U/L (ref 0–35)
AST: 23 U/L (ref 0–37)
Albumin: 4.3 g/dL (ref 3.5–5.2)
BILIRUBIN DIRECT: 0.1 mg/dL (ref 0.0–0.3)
BILIRUBIN TOTAL: 0.6 mg/dL (ref 0.2–1.2)
Total Protein: 7.1 g/dL (ref 6.0–8.3)

## 2014-03-16 ENCOUNTER — Encounter: Payer: Self-pay | Admitting: Family Medicine

## 2014-03-16 ENCOUNTER — Ambulatory Visit (INDEPENDENT_AMBULATORY_CARE_PROVIDER_SITE_OTHER): Payer: BC Managed Care – PPO | Admitting: Family Medicine

## 2014-03-16 VITALS — BP 110/80 | Temp 97.7°F | Ht 67.0 in | Wt 139.0 lb

## 2014-03-16 DIAGNOSIS — N959 Unspecified menopausal and perimenopausal disorder: Secondary | ICD-10-CM

## 2014-03-16 DIAGNOSIS — I482 Chronic atrial fibrillation, unspecified: Secondary | ICD-10-CM

## 2014-03-16 DIAGNOSIS — Z23 Encounter for immunization: Secondary | ICD-10-CM

## 2014-03-16 DIAGNOSIS — I4891 Unspecified atrial fibrillation: Secondary | ICD-10-CM

## 2014-03-16 DIAGNOSIS — E042 Nontoxic multinodular goiter: Secondary | ICD-10-CM

## 2014-03-16 MED ORDER — DILTIAZEM HCL ER COATED BEADS 180 MG PO CP24: ORAL_CAPSULE | ORAL | Status: DC

## 2014-03-16 MED ORDER — RIVAROXABAN 20 MG PO TABS: ORAL_TABLET | ORAL | Status: DC

## 2014-03-16 MED ORDER — ESTRADIOL 0.1 MG/GM VA CREA: 2.0000 g | TOPICAL_CREAM | VAGINAL | Status: DC

## 2014-03-16 NOTE — Progress Notes (Signed)
Pre visit review using our clinic review tool, if applicable. No additional management support is needed unless otherwise documented below in the visit note. 

## 2014-03-16 NOTE — Patient Instructions (Signed)
Take your prescriptions to [facility] long outpatient pharmacy on [address]  Call and set up a follow-up appointment with Dr. Marylou Mccoy......... when you see him discussed the pros and cons of your current anticoagulant versus Coumadin  Premarin cream,,,,,,,,, small amounts 2-3 times weekly for vaginal dryness  Return in one year sooner if any problems  Call [phone],,,,,,,,,,, get set up for your follow-up colonoscopy

## 2014-03-16 NOTE — Progress Notes (Signed)
   Subjective:    Patient ID: Stephanie Shaw, female    DOB: 20-Oct-1940, 74 y.o.   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 Who comes in today for a physical examination because of a history of atrial fib  She has atrial fibrillation controlled with Cardizem 180 mg daily. Her pulse varies between 70 and 90. She is asymptomatic. Exercise wise she works out 3 days a week. She also did a long hike this past weekend with no difficulty. She is currently taking xeralto  I discussed with her the controversy between Zarrella toe on Coumadin. I asked her to discuss this with her cardiologist Dr. Marylou Mccoy when she sees him this spring.  She gets routine eye care, dental care, BSE monthly, annual mammography, due a colonoscopy. Asked her to call GI and get that set up  Vaccinations updated by Apolonio Schneiders  Cognitive function normal she exercises on a regular basis home health safety reviewed no issues identified, no guns in the house, she does have a healthcare power of attorney and living well.  She also uses hormonal cream 2-3 times weekly for vaginal dryness.  Social history still actively teaching math at [employer]  Constitutional: Negative.   HENT: Negative.   Eyes: Negative.   Respiratory: Negative.   Cardiovascular: Negative.   Gastrointestinal: Negative.   Endocrine: Negative.   Genitourinary: Negative.   Musculoskeletal: Negative.   Skin: Negative.   Allergic/Immunologic: Negative.   Neurological: Negative.   Hematological: Negative.   Psychiatric/Behavioral: Negative.        Objective:   Physical Exam  Constitutional: She appears well-developed and well-nourished.  HENT:  Head: Normocephalic and atraumatic.  Right Ear: External ear normal.  Left Ear: External ear normal.  Nose: Nose normal.  Mouth/Throat: Oropharynx is clear and moist.  Eyes: EOM are normal. Pupils are equal, round, and reactive to light.    Neck: Normal range of motion. Neck supple. No JVD present. No tracheal deviation present. No thyromegaly present.  Cardiovascular: Regular rhythm, normal heart sounds and intact distal pulses.  Exam reveals no gallop and no friction rub.   No murmur heard. No carotid bruits pulse irregularly irregular 70-80  Pulmonary/Chest: Effort normal and breath sounds normal. No stridor. No respiratory distress. She has no wheezes. She has no rales. She exhibits no tenderness.  Abdominal: Soft. Bowel sounds are normal. She exhibits no distension and no mass. There is no tenderness. There is no rebound and no guarding.  Genitourinary:  Bilateral breast exam normal  Pelvic and rectal deferred  Musculoskeletal: Normal range of motion.  Lymphadenopathy:    She has no cervical adenopathy.  Neurological: She is alert. She has normal reflexes. No cranial nerve deficit. She exhibits normal muscle tone. Coordination normal.  Skin: Skin is warm and dry. No rash noted. No erythema. No pallor.  Total body skin exam normal  Psychiatric: She has a normal mood and affect. Her behavior is normal. Judgment and thought content normal.  Nursing note and vitals reviewed.         Assessment & Plan:  Healthy female  History of A. fib ........Marland Kitchen on diltiazem 180 mg daily to control rapid heart rate....... continue that medication cardiac follow-up with Dr. Marylou Mccoy......... discuss Zarrella toe versus Coumadin  Postmenopausal vaginal dryness...... hormonal cream couple times weekly

## 2014-04-18 ENCOUNTER — Other Ambulatory Visit: Payer: Self-pay | Admitting: Family Medicine

## 2014-05-09 ENCOUNTER — Telehealth: Payer: Self-pay | Admitting: *Deleted

## 2014-05-09 ENCOUNTER — Encounter: Payer: Self-pay | Admitting: Gastroenterology

## 2014-05-09 ENCOUNTER — Ambulatory Visit (INDEPENDENT_AMBULATORY_CARE_PROVIDER_SITE_OTHER): Payer: BC Managed Care – PPO | Admitting: Gastroenterology

## 2014-05-09 VITALS — BP 96/58 | HR 64 | Ht 68.0 in | Wt 136.4 lb

## 2014-05-09 DIAGNOSIS — Z1211 Encounter for screening for malignant neoplasm of colon: Secondary | ICD-10-CM

## 2014-05-09 NOTE — Telephone Encounter (Signed)
05/09/2014 RE: AJEE HEASLEY DOB: 29-Aug-1940 MRN: [id number]  Dear Dr Aundra Dubin,   We have scheduled the above patient for a colonoscopy procedure. Our records show that she is on anticoagulation therapy.  Please advise as to whether patient may come off her therapy of xarelto prior to the procedure, which is scheduled for 06/12/14. Please route your response to Dixon Boos, CMA.   Sincerely,  Dixon Boos

## 2014-05-09 NOTE — Progress Notes (Signed)
HPI: This is a  very pleasant 74 year old woman   who was referred to me by Dorena Cookey, MD  to evaluate  need for colon cancer screening .    Chief complaint is I need colon cancer screening  She had a colonoscopy 10-12 years, this was normal.    No colon cancer in her family.  No new concerning GI symptoms  Stable weight.  She is on xarelto for atrial fibrillation, for 5 years.  Has never stopped.    Review of systems: Pertinent positive and negative review of systems were noted in the above HPI section. Complete review of systems was performed and was otherwise normal.   Past Medical History  Diagnosis Date  . DEGENERATIVE JOINT DISEASE, RIGHT HIP 02/16/2007  . POSTMENOPAUSAL SYNDROME 02/16/2007  . PREMATURE ATRIAL CONTRACTIONS 02/16/2007  . 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 Venous insufficiency   . Dupuytren's contracture   . Osteoarthritis of left knee   . Osteoarthritis of hip     right  . S/P breast biopsy, left     two o'clock position - benign    History reviewed. No pertinent past surgical history.  Current Outpatient Prescriptions  Medication Sig Dispense Refill  . cholecalciferol (VITAMIN D) 1000 UNITS tablet Take 1,000 Units by mouth daily.    Marland Kitchen diltiazem (CARDIZEM CD) 180 MG 24 hr capsule TAKE ONE CAPSULE EVERY DAY 90 capsule 3  . EPINEPHrine (EPI-PEN) 0.3 mg/0.3 mL SOAJ injection Inject 0.3 mLs (0.3 mg total) into the muscle once. 2 Device 3  . estradiol (ESTRACE) 0.1 MG/GM vaginal cream Place 5.95 Applicatorfuls vaginally 2 (two) times a week. 42.5 g 11  . ferrous sulfate 325 (65 FE) MG EC tablet Take 325 mg by mouth 2 (two) times a week.     . Flaxseed, Linseed, (FLAX SEED OIL) 1000 MG CAPS Take 2 capsules by mouth daily.      . Magnesium 250 MG TABS Take 1 tablet by mouth at bedtime.    . Multiple Vitamin (MULTIVITAMIN) tablet Take 1 tablet by mouth daily.      . rivaroxaban (XARELTO) 20 MG TABS tablet TAKE 1 TABLET BY MOUTH EVERY DAY 90  tablet 3   No current facility-administered medications for this visit.    Allergies as of 05/09/2014 - Review Complete 05/09/2014  Allergen Reaction Noted  . Sulfonamide derivatives  11/09/2006    Family History  Problem Relation Age of Onset  . Cancer Father   . Alcohol abuse Father   . Cancer Mother   . Diabetes Mother   . Heart failure Mother   . Osteoarthritis Sister     History   Social History  . Marital Status: Divorced    Spouse Name: N/A  . Number of Children: N/A  . Years of Education: N/A   Occupational History  . Not on file.   Social History Main Topics  . Smoking status: Former Research scientist (life sciences)  . Smokeless tobacco: Not on file  . Alcohol Use: No  . Drug Use: Not on file  . Sexual Activity: Not on file   Other Topics Concern  . Not on file   Social History Narrative     Physical Exam: BP 96/58 mmHg  Pulse 64  Ht 5\' 8"  (1.727 m)  Wt 136 lb 6.4 oz (61.871 kg)  BMI 20.74 kg/m2 Constitutional: generally well-appearing Psychiatric: alert and oriented x3 Eyes: extraocular movements intact Mouth: oral pharynx moist, no lesions Neck: supple no lymphadenopathy Cardiovascular: heart regular  rate and rhythm Lungs: clear to auscultation bilaterally Abdomen: soft, nontender, nondistended, no obvious ascites, no peritoneal signs, normal bowel sounds Extremities: no lower extremity edema bilaterally Skin: no lesions on visible extremities   Assessment and plan: 74 y.o. female with  routine risk for colon cancer, increased risk for procedural complications due to ongoing blood thinner use  She is 74 years old and in very good health except for chronic atrial fibrillation. I think colon cancer screening is still a very relevant clinical question for her. We did discuss that she is on a blood thinner and she will need to hold that for 1 day prior to colonoscopy. She understands there is a very small but real risk for stroke when she holds the blood thinner for  atrial fibrillation. We will verify with her cardiologist that this is safe for her to do but will go ahead and plan for colonoscopy at her soonest convenience otherwise.   Owens Loffler, MD [facility] 05/09/2014, 3:07 PM  Cc: Dorena Cookey, MD

## 2014-05-09 NOTE — Patient Instructions (Addendum)
We will get records sent from your previous gastroenterologist (Dr. Earlean Shawl) for review.  This will include any endoscopic (colonoscopy or upper endoscopy) procedures and any associated pathology reports.   We will contact your cardiologist (Dr. Aundra Dubin) to verify that holding your xarelto for 1 day prior to colonoscopy is reasonable. You have been scheduled for a colonoscopy. Please follow written instructions given to you at your visit today.  Please pick up your prep supplies at the pharmacy within the next 1-3 days. If you use inhalers (even only as needed), please bring them with you on the day of your procedure. Your physician has requested that you go to www.startemmi.com and enter the access code given to you at your visit today. This web site gives a general overview about your procedure. However, you should still follow specific instructions given to you by our office regarding your preparation for the procedure.

## 2014-05-09 NOTE — Telephone Encounter (Signed)
Dr Ardis Hughs- You suggested holding Xarelto 1 day prior to scheduled colonoscopy. Dr Barton Dubois hold for 3 days prior. Do you want me just to go with holding 1 day or 3 days as Dr Aundra Dubin indicated?

## 2014-05-09 NOTE — Telephone Encounter (Signed)
May hold Xarelto for 3 days prior to procedure. Restart afterwards.

## 2014-05-10 NOTE — Telephone Encounter (Signed)
Patient advised that she may hold Xarelto 1 day prior to her colonoscopy per Dr Aundra Dubin.

## 2014-05-10 NOTE — Telephone Encounter (Signed)
Should be out of her system if held for 1 day so that should be enough.  Thanks for checking.

## 2014-05-10 NOTE — Telephone Encounter (Signed)
Patient verbalizes understanding of instructions.

## 2014-05-19 ENCOUNTER — Telehealth: Payer: Self-pay | Admitting: Gastroenterology

## 2014-05-19 NOTE — Telephone Encounter (Signed)
Colonoscopy Dr. Earlean Shawl 03/2003 done for screening, no FH of colon cancer;  Prep was "excellent".  Exam was complete to cecum and was a "normal colonoscopy-no colorectal neoplasia."  He wrote repeat colonoscopy in 5-10 years and then circled the #5.  Those recommendations are not consistent with nationally accepted screening guidelines (then or now), should have been 10 years.

## 2014-06-12 ENCOUNTER — Ambulatory Visit (AMBULATORY_SURGERY_CENTER): Payer: BC Managed Care – PPO | Admitting: Gastroenterology

## 2014-06-12 ENCOUNTER — Encounter: Payer: Self-pay | Admitting: Gastroenterology

## 2014-06-12 VITALS — BP 109/59 | HR 79 | Temp 96.5°F | Resp 14 | Ht 68.0 in | Wt 136.0 lb

## 2014-06-12 DIAGNOSIS — K5730 Diverticulosis of large intestine without perforation or abscess without bleeding: Secondary | ICD-10-CM

## 2014-06-12 DIAGNOSIS — Z1211 Encounter for screening for malignant neoplasm of colon: Secondary | ICD-10-CM

## 2014-06-12 DIAGNOSIS — D122 Benign neoplasm of ascending colon: Secondary | ICD-10-CM | POA: Diagnosis not present

## 2014-06-12 MED ORDER — SODIUM CHLORIDE 0.9 % IV SOLN
500.0000 mL | INTRAVENOUS | Status: DC
Start: 2014-06-12 — End: 2014-06-13

## 2014-06-12 NOTE — Progress Notes (Signed)
Called to room to assist during endoscopic procedure.  Patient ID and intended procedure confirmed with present staff. Received instructions for my participation in the procedure from the performing physician.  

## 2014-06-12 NOTE — Progress Notes (Signed)
Report to PACU, RN, vss, BBS= Clear.  

## 2014-06-12 NOTE — Op Note (Signed)
[place]  [address] [place], [address]   COLONOSCOPY PROCEDURE REPORT  PATIENT: Stephanie Shaw, Stephanie Shaw  MR#: [id number] BIRTHDATE: 1940-08-11 , 74  yrs. old GENDER: female ENDOSCOPIST: Milus Banister, MD REFERRED WT:UUEKCMK Todd, MD PROCEDURE DATE:  06/12/2014 PROCEDURE:   Colonoscopy, screening and Colonoscopy with snare polypectomy First Screening Colonoscopy - Avg.  risk and is 50 yrs.  old or older Yes.  Prior Negative Screening - Now for repeat screening. N/A  History of Adenoma - Now for follow-up colonoscopy & has been > or = to 3 yrs.  N/A  Polyps removed today? Yes ASA CLASS:   Class II INDICATIONS:Screening for colonic neoplasia and Colorectal Neoplasm Risk Assessment for this procedure is average risk. MEDICATIONS: Monitored anesthesia care and Propofol 200 mg IV  DESCRIPTION OF PROCEDURE:   After the risks benefits and alternatives of the procedure were thoroughly explained, informed consent was obtained.  The digital rectal exam revealed no abnormalities of the rectum.   The LB [id number]  endoscope was introduced through the anus and advanced to the cecum, which was identified by both the appendix and ileocecal valve. No adverse events experienced.   The quality of the prep was excellent.  The instrument was then slowly withdrawn as the colon was fully examined. Estimated blood loss is zero unless otherwise noted in this procedure report.   COLON FINDINGS: A sessile polyp measuring 4 mm in size was found in the ascending colon.  A polypectomy was performed with a cold snare.  The resection was complete, the polyp tissue was completely retrieved and sent to histology.   There was mild diverticulosis noted throughout the entire examined colon.   The examination was otherwise normal.  Retroflexed views revealed no abnormalities. The time to cecum = 5.3 Withdrawal time = 8.5   The scope was withdrawn and the procedure  completed. COMPLICATIONS: There were no immediate complications.  ENDOSCOPIC IMPRESSION: 1.   Sessile polyp was found in the ascending colon; polypectomy was performed with a cold snare 2.   Mild diverticulosis was noted throughout the entire examined colon 3.   The examination was otherwise normal  RECOMMENDATIONS: 1. If the polyp(s) removed today are proven to be adenomatous (pre-cancerous) polyps, you will need a repeat colonoscopy in 5 years.  You will receive a letter within 1-2 weeks with the results of your biopsy as well as final recommendations.  Please call my office if you have not received a letter after 3 weeks. 2. OK to resume your xarelto today. eSigned:  Milus Banister, MD 06/12/2014 3:19 PM

## 2014-06-12 NOTE — Patient Instructions (Signed)
Discharge instructions given. Handouts on polyps and diverticulosis. Resume previous medications. YOU HAD AN ENDOSCOPIC PROCEDURE TODAY AT THE [facility]:   Refer to the procedure report that was given to you for any specific questions about what was found during the examination.  If the procedure report does not answer your questions, please call your gastroenterologist to clarify.  If you requested that your care partner not be given the details of your procedure findings, then the procedure report has been included in a sealed envelope for you to review at your convenience later.  YOU SHOULD EXPECT: Some feelings of bloating in the abdomen. Passage of more gas than usual.  Walking can help get rid of the air that was put into your GI tract during the procedure and reduce the bloating. If you had a lower endoscopy (such as a colonoscopy or flexible sigmoidoscopy) you may notice spotting of blood in your stool or on the toilet paper. If you underwent a bowel prep for your procedure, you may not have a normal bowel movement for a few days.  Please Note:  You might notice some irritation and congestion in your nose or some drainage.  This is from the oxygen used during your procedure.  There is no need for concern and it should clear up in a day or so.  SYMPTOMS TO REPORT IMMEDIATELY:   Following lower endoscopy (colonoscopy or flexible sigmoidoscopy):  Excessive amounts of blood in the stool  Significant tenderness or worsening of abdominal pains  Swelling of the abdomen that is new, acute  Fever of 100F or higher   For urgent or emergent issues, a gastroenterologist can be reached at any hour by calling [phone].   DIET: Your first meal following the procedure should be a small meal and then it is ok to progress to your normal diet. Heavy or fried foods are harder to digest and may make you feel nauseous or bloated.  Likewise, meals heavy in dairy and vegetables can  increase bloating.  Drink plenty of fluids but you should avoid alcoholic beverages for 24 hours.  ACTIVITY:  You should plan to take it easy for the rest of today and you should NOT DRIVE or use heavy machinery until tomorrow (because of the sedation medicines used during the test).    FOLLOW UP: Our staff will call the number listed on your records the next business day following your procedure to check on you and address any questions or concerns that you may have regarding the information given to you following your procedure. If we do not reach you, we will leave a message.  However, if you are feeling well and you are not experiencing any problems, there is no need to return our call.  We will assume that you have returned to your regular daily activities without incident.  If any biopsies were taken you will be contacted by phone or by letter within the next 1-3 weeks.  Please call us at [phone] if you have not heard about the biopsies in 3 weeks.    SIGNATURES/CONFIDENTIALITY: You and/or your care partner have signed paperwork which will be entered into your electronic medical record.  These signatures attest to the fact that that the information above on your After Visit Summary has been reviewed and is understood.  Full responsibility of the confidentiality of this discharge information lies with you and/or your care-partner. 

## 2014-06-13 ENCOUNTER — Telehealth: Payer: Self-pay

## 2014-06-13 NOTE — Telephone Encounter (Signed)
Left a message at [phone] for the pt to call us back if any questions or concerns. maw

## 2014-06-15 ENCOUNTER — Ambulatory Visit (INDEPENDENT_AMBULATORY_CARE_PROVIDER_SITE_OTHER): Payer: BC Managed Care – PPO | Admitting: Cardiology

## 2014-06-15 ENCOUNTER — Encounter: Payer: Self-pay | Admitting: Cardiology

## 2014-06-15 VITALS — BP 124/64 | HR 88 | Ht 68.0 in | Wt 134.0 lb

## 2014-06-15 DIAGNOSIS — I4819 Other persistent atrial fibrillation: Secondary | ICD-10-CM

## 2014-06-15 DIAGNOSIS — I481 Persistent atrial fibrillation: Secondary | ICD-10-CM

## 2014-06-15 NOTE — Patient Instructions (Signed)
Medication Instructions:  No changes today  Labwork: BMET/CBCd -August 2016  Testing/Procedures: None today  Follow-Up: Your physician wants you to follow-up in: 1 year with Dr Aundra Dubin. (May 2017).You will receive a reminder letter in the mail two months in advance. If you don't receive a letter, please call our office to schedule the follow-up appointment.

## 2014-06-15 NOTE — Progress Notes (Signed)
Patient ID: Stephanie Shaw, female   DOB: 1940/09/05, 74 y.o.   MRN: [id number] PCP: Dr. Sherren Mocha  74 yo with chronic atrial fibrillation returns for cardiology evaluation.  Patient has had prior history of PACs, but atrial fibrillation was noted for the first time on 4/12.  She seems to have remained in atrial fibrillation since that time.  She walks, hikes, and occasionally bikes for exercise.  No exertional dyspnea.  No chest pain.  Exercise tolerance has not changed.  No lightheadedness or syncope. No stroke-like symptoms.  No palpitations.  No falls or overt GI bleeding.  She is now taking diltiazem CD and Xarelto.  She is still teaching at [employer]  ECG (05/13/10): Atrial fibrillation, rate 126 ECG (05/21/10): Atrial fibrillation, rate 91 ECG (6/12): Atrial fibrillation, left axis deviation, poor anterior R wave progression, rate 95 ECG (9/12): atrial fibrillation, rate 80 ECG (5/16): atrial fibrillation, right axis deviation  Labs (4/12): TSH normal, LFTs normal, HCT 40, K 4.2, creatinine 0.9, LDL 90, HDL Labs (9/12): TSH, free T4, fre T3 all normal Labs (10/13): K 4.7, creatinine 1.0, HCT 40, LDL 94 Labs (2/16): K 3.8, creatinine 0.88, LDL 114, HDL 91, TSH normal, HCT 39.1  PMH:  1. Atrial fibrillation: First noted on 05/13/10.  Had PACs noted prior to this.  Persistent.  Echo (5/12): EF 55%, normal wall thickness, normal valves, moderate biatrial enlargement, normal RV.  2. Duputren's contractures 3. Osteoarthritis 4. Venous insufficiency 5. Multinodular goiter: Nontoxic.   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  Nonsmoker, rare ETOH.   FH: Mother with atrial fibrillation, CHF, pacemaker, and pancreatic cancer.  Father with lung cancer.    Current Outpatient Prescriptions  Medication Sig Dispense Refill  . cholecalciferol (VITAMIN D) 1000 UNITS tablet Take 1,000 Units by mouth daily.    Marland Kitchen diltiazem (CARDIZEM CD) 180 MG 24 hr capsule TAKE ONE CAPSULE EVERY DAY 90  capsule 3  . EPINEPHrine (EPI-PEN) 0.3 mg/0.3 mL SOAJ injection Inject 0.3 mLs (0.3 mg total) into the muscle once. (Patient not taking: Reported on 06/12/2014) 2 Device 3  . estradiol (ESTRACE) 0.1 MG/GM vaginal cream Place 2.02 Applicatorfuls vaginally 2 (two) times a week. 42.5 g 11  . ferrous sulfate 325 (65 FE) MG EC tablet Take 325 mg by mouth 2 (two) times a week.     . Flaxseed, Linseed, (FLAX SEED OIL) 1000 MG CAPS Take 2 capsules by mouth daily.      . Magnesium 250 MG TABS Take 1 tablet by mouth at bedtime.    . Multiple Vitamin (MULTIVITAMIN) tablet Take 1 tablet by mouth daily.      . rivaroxaban (XARELTO) 20 MG TABS tablet TAKE 1 TABLET BY MOUTH EVERY DAY 90 tablet 3   No current facility-administered medications for this visit.    BP 124/64 mmHg  Pulse 88  Ht 5\' 8"  (1.727 m)  Wt 134 lb (60.782 kg)  BMI 20.38 kg/m2 General: NAD Neck: No JVD, no thyromegaly or thyroid nodule.  Lungs: Clear to auscultation bilaterally with normal respiratory effort. CV: Nondisplaced PMI.  Heart irregular S1/S2, no S3/S4, no murmur.  No peripheral edema.  No carotid bruit.  Normal pedal pulses.  Abdomen: Soft, nontender, no hepatosplenomegaly, no distention.  Neurologic: Alert and oriented x 3.  Psych: Normal affect. Extremities: No clubbing or cyanosis.   Assessment/Plan:  Atrial fibrillation   Patient has minimally symptomatic persistent atrial fibrillation. CHADSVASC score is 2 (female gender, age > 74). Her mother  had atrial fibrillation. Echo showed preserved LV function and no significant valvular dysfunction.  - She will continue diltiazem CD and Xarelto. BMET and CBC looked ok in 2/16.  I think it is reasonable for her to continue Xarelto, I do not think there would be any particular advantage in switching to a different agent as she has tolerated this well for a number of years now.   - Patient is minimally symptomatic at this time with rate control on diltiazem. In the past, I  offered her cardioversion, but she wanted to manage conservatively, which is perfectly appropriate. - BMET/CBC in 8/16, followup in 1 year.   Loralie Champagne 06/15/2014

## 2014-06-20 ENCOUNTER — Encounter: Payer: Self-pay | Admitting: Gastroenterology

## 2014-09-04 ENCOUNTER — Other Ambulatory Visit: Payer: Self-pay

## 2014-09-04 DIAGNOSIS — Z1231 Encounter for screening mammogram for malignant neoplasm of breast: Secondary | ICD-10-CM

## 2014-09-15 ENCOUNTER — Other Ambulatory Visit (INDEPENDENT_AMBULATORY_CARE_PROVIDER_SITE_OTHER): Payer: BC Managed Care – PPO

## 2014-09-15 DIAGNOSIS — I4891 Unspecified atrial fibrillation: Secondary | ICD-10-CM

## 2014-09-15 DIAGNOSIS — I4819 Other persistent atrial fibrillation: Secondary | ICD-10-CM

## 2014-09-16 LAB — CBC WITH DIFFERENTIAL/PLATELET
BASOS ABS: 0.1 10*3/uL (ref 0.0–0.1)
Basophils Relative: 1 % (ref 0–1)
EOS ABS: 0.1 10*3/uL (ref 0.0–0.7)
EOS PCT: 2 % (ref 0–5)
HEMATOCRIT: 38.2 % (ref 36.0–46.0)
Hemoglobin: 12.6 g/dL (ref 12.0–15.0)
LYMPHS PCT: 20 % (ref 12–46)
Lymphs Abs: 1.1 10*3/uL (ref 0.7–4.0)
MCH: 31.2 pg (ref 26.0–34.0)
MCHC: 33 g/dL (ref 30.0–36.0)
MCV: 94.6 fL (ref 78.0–100.0)
MPV: 10.1 fL (ref 8.6–12.4)
Monocytes Absolute: 0.4 10*3/uL (ref 0.1–1.0)
Monocytes Relative: 8 % (ref 3–12)
NEUTROS PCT: 69 % (ref 43–77)
Neutro Abs: 3.8 10*3/uL (ref 1.7–7.7)
PLATELETS: 180 10*3/uL (ref 150–400)
RBC: 4.04 MIL/uL (ref 3.87–5.11)
RDW: 13.8 % (ref 11.5–15.5)
WBC: 5.5 10*3/uL (ref 4.0–10.5)

## 2014-09-16 LAB — BASIC METABOLIC PANEL
BUN: 16 mg/dL (ref 7–25)
CHLORIDE: 103 mmol/L (ref 98–110)
CO2: 27 mmol/L (ref 20–31)
Calcium: 9.5 mg/dL (ref 8.6–10.4)
Creat: 0.82 mg/dL (ref 0.60–0.93)
Glucose, Bld: 95 mg/dL (ref 65–99)
POTASSIUM: 4.4 mmol/L (ref 3.5–5.3)
Sodium: 139 mmol/L (ref 135–146)

## 2014-10-06 ENCOUNTER — Ambulatory Visit
Admission: RE | Admit: 2014-10-06 | Discharge: 2014-10-06 | Disposition: A | Discharge status: Home / Self Care | Payer: BC Managed Care – PPO | Source: Ambulatory Visit

## 2014-10-06 DIAGNOSIS — Z1231 Encounter for screening mammogram for malignant neoplasm of breast: Secondary | ICD-10-CM

## 2014-10-10 ENCOUNTER — Other Ambulatory Visit: Payer: Self-pay | Admitting: Family Medicine

## 2014-10-10 DIAGNOSIS — R928 Other abnormal and inconclusive findings on diagnostic imaging of breast: Secondary | ICD-10-CM

## 2014-10-19 ENCOUNTER — Ambulatory Visit
Admission: RE | Admit: 2014-10-19 | Discharge: 2014-10-19 | Disposition: A | Discharge status: Home / Self Care | Payer: BC Managed Care – PPO | Source: Ambulatory Visit | Attending: Family Medicine | Admitting: Family Medicine

## 2014-10-19 ENCOUNTER — Other Ambulatory Visit: Payer: Self-pay | Admitting: Family Medicine

## 2014-10-19 DIAGNOSIS — R928 Other abnormal and inconclusive findings on diagnostic imaging of breast: Secondary | ICD-10-CM

## 2014-10-19 DIAGNOSIS — R921 Mammographic calcification found on diagnostic imaging of breast: Secondary | ICD-10-CM

## 2014-10-20 ENCOUNTER — Other Ambulatory Visit: Payer: Self-pay | Admitting: Cardiology

## 2014-10-20 ENCOUNTER — Other Ambulatory Visit: Payer: Self-pay | Admitting: Family Medicine

## 2014-10-20 DIAGNOSIS — R921 Mammographic calcification found on diagnostic imaging of breast: Secondary | ICD-10-CM

## 2014-11-06 ENCOUNTER — Other Ambulatory Visit: Payer: Self-pay | Admitting: Family Medicine

## 2014-11-06 ENCOUNTER — Ambulatory Visit
Admission: RE | Admit: 2014-11-06 | Discharge: 2014-11-06 | Disposition: A | Discharge status: Home / Self Care | Payer: BC Managed Care – PPO | Source: Ambulatory Visit | Attending: Family Medicine | Admitting: Family Medicine

## 2014-11-06 DIAGNOSIS — R921 Mammographic calcification found on diagnostic imaging of breast: Secondary | ICD-10-CM

## 2015-03-01 MED FILL — PROLENSA 0.07% EYE DROPS: 0.07 | 60 days supply | Qty: 3 | Fill #0

## 2015-03-01 MED FILL — VIGAMOX 0.5% EYE DROPS: 0.5 | 2 days supply | Qty: 3 | Fill #0

## 2015-03-01 MED FILL — DUREZOL 0.05% EYE DROPS: 0.05 | 25 days supply | Qty: 5 | Fill #0

## 2015-03-05 ENCOUNTER — Ambulatory Visit (INDEPENDENT_AMBULATORY_CARE_PROVIDER_SITE_OTHER)
Admission: RE | Admit: 2015-03-05 | Discharge: 2015-03-05 | Disposition: A | Discharge status: Home / Self Care | Payer: BC Managed Care – PPO | Source: Ambulatory Visit | Attending: Family Medicine | Admitting: Family Medicine

## 2015-03-05 ENCOUNTER — Other Ambulatory Visit: Payer: Self-pay | Admitting: Family Medicine

## 2015-03-05 ENCOUNTER — Ambulatory Visit (INDEPENDENT_AMBULATORY_CARE_PROVIDER_SITE_OTHER): Payer: BC Managed Care – PPO | Admitting: Family Medicine

## 2015-03-05 ENCOUNTER — Encounter: Payer: Self-pay | Admitting: Family Medicine

## 2015-03-05 VITALS — BP 110/80 | Temp 98.4°F | Wt 136.0 lb

## 2015-03-05 DIAGNOSIS — S52531A Colles' fracture of right radius, initial encounter for closed fracture: Secondary | ICD-10-CM

## 2015-03-05 DIAGNOSIS — M25531 Pain in right wrist: Secondary | ICD-10-CM

## 2015-03-05 HISTORY — DX: Colles' fracture of right radius, initial encounter for closed fracture: S52.531A

## 2015-03-05 NOTE — Progress Notes (Signed)
Pre visit review using our clinic review tool, if applicable. No additional management support is needed unless otherwise documented below in the visit note. 

## 2015-03-05 NOTE — Patient Instructions (Signed)
Elevation and ice for the first 2-3 days. Wear your splint continuously  Follow-up in 4-6 weeks

## 2015-03-05 NOTE — Progress Notes (Signed)
   Subjective:    Patient ID: Stephanie Shaw, female    DOB: 01/08/1941, 75 y.o.   MRN: [id number]  HPI  [name] is a 75 year old divorced female nonsmoker who comes in today for evaluation of a fall  She states she fell yesterday while walking on the outstretched left and right hand. She also bruised her forehead. Today she noticed her right wrist was extremely sore. 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 Elevation....... Ice...... Splinting......Marland Kitchen Motrin follow-up in 6 weeks

## 2015-03-26 ENCOUNTER — Other Ambulatory Visit: Payer: Self-pay | Admitting: Family Medicine

## 2015-03-26 MED FILL — VIGAMOX 0.5% EYE DROPS: 0.5 | 4 days supply | Qty: 3 | Fill #0

## 2015-03-27 ENCOUNTER — Other Ambulatory Visit: Payer: Self-pay

## 2015-03-27 DIAGNOSIS — I482 Chronic atrial fibrillation, unspecified: Secondary | ICD-10-CM

## 2015-03-27 MED ORDER — RIVAROXABAN 20 MG PO TABS: ORAL_TABLET | ORAL | Status: DC

## 2015-03-27 MED FILL — XARELTO 20 MG TABLET: 20 | 90 days supply | Qty: 90 | Fill #0

## 2015-04-06 ENCOUNTER — Other Ambulatory Visit: Payer: Self-pay | Admitting: Family Medicine

## 2015-04-17 ENCOUNTER — Other Ambulatory Visit: Payer: Self-pay | Admitting: Family Medicine

## 2015-04-17 DIAGNOSIS — N63 Unspecified lump in unspecified breast: Secondary | ICD-10-CM

## 2015-04-17 DIAGNOSIS — R921 Mammographic calcification found on diagnostic imaging of breast: Secondary | ICD-10-CM

## 2015-04-22 ENCOUNTER — Emergency Department (HOSPITAL_COMMUNITY): Payer: BC Managed Care – PPO

## 2015-04-22 ENCOUNTER — Encounter (HOSPITAL_COMMUNITY): Payer: Self-pay

## 2015-04-22 ENCOUNTER — Telehealth: Payer: Self-pay | Admitting: Family Medicine

## 2015-04-22 ENCOUNTER — Emergency Department (HOSPITAL_COMMUNITY)
Admission: EM | Admit: 2015-04-22 | Discharge: 2015-04-22 | Disposition: A | Discharge status: Home / Self Care | Payer: BC Managed Care – PPO | Attending: Emergency Medicine | Admitting: Emergency Medicine

## 2015-04-22 DIAGNOSIS — Y998 Other external cause status: Secondary | ICD-10-CM | POA: Diagnosis not present

## 2015-04-22 DIAGNOSIS — S50812A Abrasion of left forearm, initial encounter: Secondary | ICD-10-CM | POA: Insufficient documentation

## 2015-04-22 DIAGNOSIS — Z8679 Personal history of other diseases of the circulatory system: Secondary | ICD-10-CM | POA: Diagnosis not present

## 2015-04-22 DIAGNOSIS — S4992XA Unspecified injury of left shoulder and upper arm, initial encounter: Secondary | ICD-10-CM | POA: Diagnosis present

## 2015-04-22 DIAGNOSIS — Z8739 Personal history of other diseases of the musculoskeletal system and connective tissue: Secondary | ICD-10-CM | POA: Diagnosis not present

## 2015-04-22 DIAGNOSIS — Z7901 Long term (current) use of anticoagulants: Secondary | ICD-10-CM | POA: Diagnosis not present

## 2015-04-22 DIAGNOSIS — Z8742 Personal history of other diseases of the female genital tract: Secondary | ICD-10-CM | POA: Diagnosis not present

## 2015-04-22 DIAGNOSIS — W010XXA Fall on same level from slipping, tripping and stumbling without subsequent striking against object, initial encounter: Secondary | ICD-10-CM | POA: Diagnosis not present

## 2015-04-22 DIAGNOSIS — Y9289 Other specified places as the place of occurrence of the external cause: Secondary | ICD-10-CM | POA: Diagnosis not present

## 2015-04-22 DIAGNOSIS — Z87891 Personal history of nicotine dependence: Secondary | ICD-10-CM | POA: Insufficient documentation

## 2015-04-22 DIAGNOSIS — Z79899 Other long term (current) drug therapy: Secondary | ICD-10-CM | POA: Diagnosis not present

## 2015-04-22 DIAGNOSIS — S42202A Unspecified fracture of upper end of left humerus, initial encounter for closed fracture: Secondary | ICD-10-CM | POA: Diagnosis not present

## 2015-04-22 DIAGNOSIS — Y9301 Activity, walking, marching and hiking: Secondary | ICD-10-CM | POA: Diagnosis not present

## 2015-04-22 MED ORDER — ACETAMINOPHEN 500 MG PO TABS
1000.0000 mg | ORAL_TABLET | Freq: Once | ORAL | Status: AC
  Administered 2015-04-22: 1000 mg via ORAL
  Filled 2015-04-22: qty 2

## 2015-04-22 MED ORDER — TRAMADOL HCL 50 MG PO TABS: 50.0000 mg | ORAL_TABLET | Freq: Four times a day (QID) | ORAL | Status: DC | PRN

## 2015-04-22 NOTE — ED Notes (Signed)
Pt states she tripped over a root in her yard this evening around 3pm and landed on her left arm. Pain is severe with any movement and there is bruising to left upper arm and skin tear to elbow. She also reports pain to left leg. Denies LOC. Pt A&OX4, ambulatory.

## 2015-04-22 NOTE — Discharge Instructions (Signed)
It was our pleasure to provide your ER care today - we hope that you feel better.  Elevate head/shoulder area as much as possible to help with pain and swelling, even while sleeping.   Icepack/cold to sore area.   Wear shoulder immobilizer.   Take tylenol as nee.   If pain not controlled by tylenol, you may try ultram as need for pain - no driving when taking.  We did make a home health referral for evaluation and assistance at home -  They should call you tomorrow - your doctors office should also be able to assist with home health services as needed.   Follow up with orthopedist in the coming week - call office tomorrow to arrange appointment - see referral.  Use great care to avoid falling, especially since you are on a blood thinner.   Return to ER if worse, new symptoms, new or severe pain, severe headache, vomiting, numbness/weakness, other concern.     Shoulder Fracture (Proximal Humerus or Glenoid) A shoulder fracture is a broken upper arm bone or a broken socket bone. The humerus is the upper arm bone and the glenoid is the shoulder socket. Proximal means the humerus is broken near the shoulder. Most of the time the bones of a broken shoulder are in an acceptable position. Usually, the injury can be treated with a shoulder immobilizer or sling and swath bandage. These devices support the arm and prevent any shoulder movement. If the bones are not in a good position, then surgery is sometimes needed. Shoulder fractures usually initially cause swelling, pain, and discoloration around the upper arm. They heal in 8 to 12 weeks with proper treatment. SYMPTOMS  At the time of injury:  Pain.  Tenderness.  Regular body contours are not normal. Later symptoms may include:  Swelling and bruising of the elbow and hand.  Swelling and bruising of the arm or chest. Other symptoms include:  Pain when lifting or turning the arm.  Paralysis below the fracture.  Numbness or coldness  below the fracture. CAUSES   Indirect force from falling on an outstretched arm.  A blow to the shoulder. RISK INCREASES WITH:  Not being in shape.  Playing contact sports, such as football, soccer, hockey, or rugby.  Sports where falling on an outstretched arm occurs, such as basketball, skateboarding, or volleyball.  History of bone or joint disease.  History of shoulder injury. PREVENTION  Warm up before activity.  Stretch before activity.  Stay in shape with your:  Heart fitness.  Flexibility.  Shoulder Strength.  Falling with the proper technique. PROGNOSIS  In adults, healing time is about 7 weeks. For children, healing time is about 5 weeks. Surgery may be needed. RELATED COMPLICATIONS  The bones do not heal together (nonunion).  The bones do not align properly when they heal (malunion).  Long-term problems with pain, stiffness, swelling, or loss of motion.  The injured arm heals shorter than the other.  Nerves are injured in the arm.  Arthritis in the shoulder.  Normal bone growth is interrupted in children.  Blood supply to the shoulder joint is diminished. TREATMENT If the bones are aligned, then initial treatment will be with ice and medicine to help with pain. The shoulder will be held in place with a sling (immobilization). The shoulder will be allowed to heal for up to 6 weeks. Injuries that may need surgery include:  Severe fractures.  Fractures that are not in appropriate alignment (displaced).  Non-displaced fractures (not  common). Surgery helps the bones align correctly. The bones may be held in place with:  Sutures.  Wires.  Rods.  Plates.  Screws.  Pins. If you have had surgery or not, you will likely be assisted by a physical therapist or athletic trainer to get the best results with your injured shoulder. This will likely include exercises to strengthen and stretch the injured and surrounding areas. MEDICATION  If pain  medicine is needed, nonsteroidal anti-inflammatory medicines (such as aspirin or ibuprofen) or other minor pain relievers (such as acetaminophen) are often advised.  Do not take pain medicine for 7 days before surgery.  Stronger pain relievers may be prescribed. Use only as directed and take only as much as you need. COLD THERAPY Cold treatment (icing) relieves pain and reduces inflammation. Cold treatment should be applied for 10 to 15 minutes every 2 to 3 hours, and immediately after activity that aggravates your symptoms. Use ice packs or an ice massage. SEEK IMMEDIATE MEDICAL CARE IF:  You have severe shoulder pain unrelieved by rest and taking pain medicine.  You have pain, numbness, tingling, or weakness in the hand or wrist.  You have shortness of breath, chest pain, severe weakness, or fainting.  You have severe pain with motion of the fingers or wrist.  Blue, gray, or dark color appears in the fingernails on injured extremity.   This information is not intended to replace advice given to you by your health care provider. Make sure you discuss any questions you have with your health care provider.   Document Released: 01/06/2005 Document Revised: 03/31/2011 Document Reviewed: 04/20/2008 Elsevier Interactive Patient Education 2016 [employer].    How to Use a Shoulder Immobilizer A shoulder immobilizer is a device that you may have to wear after a shoulder injury or surgery. This device keeps your arm from moving. This prevents additional pain or injury. It also supports your arm next to your body as your shoulder heals. You may need to wear a shoulder immobilizer to treat a broken bone (fracture) in your shoulder. You may also need to wear one if you have an injury that moves your shoulder out of position (dislocation). There are different types of shoulder immobilizers. The one that you get depends on your injury. RISKS AND COMPLICATIONS Wearing a shoulder immobilizer in the  wrong way can let your injured shoulder move around too much. This may delay healing and make your pain and swelling worse. HOW TO USE YOUR SHOULDER IMMOBILIZER  The part of the immobilizer that goes around your neck (sling) should support your upper arm, with your elbow bent and your lower arm and hand across your chest.  Make sure that your elbow:  Is snug against the back pocket of the sling.  Does not move away from your body.  The strap of the immobilizer should go over your shoulder and support your arm and hand. Your hand should be slightly higher than your elbow. It should not hang loosely over the edge of the sling.  If the long strap has a pad, place it where it is most comfortable on your neck.  Carefully follow your health care provider's instructions for wearing your shoulder immobilizer. Your health care provider may want you to:  Loosen your immobilizer to straighten your elbow and move your wrist and fingers. You may have to do this several times each day. Ask your health care provider when you should do this and how often.  Remove your immobilizer once every  day to shower, but limit the movement in your injured arm. Before putting the immobilizer back on, use a towel to dry the area under your arm completely.  Remove your immobilizer to do shoulder exercises at home as directed by your health care provider.  Wear your immobilizer while you sleep. You may sleep more comfortably if you have your upper body raised on pillows. SEEK MEDICAL CARE IF:  Your immobilizer is not supporting your arm properly.  Your immobilizer gets damaged.  You have worsening pain or swelling in your shoulder, arm, or hand.  Your shoulder, arm, or hand changes color or temperature.  You lose feeling in your shoulder, arm, or hand.   This information is not intended to replace advice given to you by your health care provider. Make sure you discuss any questions you have with your health care  provider.   Document Released: 02/14/2004 Document Revised: 05/23/2014 Document Reviewed: 12/14/2013 Elsevier Interactive Patient Education 2016 [place].    Cryotherapy Cryotherapy means treatment with cold. Ice or gel packs can be used to reduce both pain and swelling. Ice is the most helpful within the first 24 to 48 hours after an injury or flare-up from overusing a muscle or joint. Sprains, strains, spasms, burning pain, shooting pain, and aches can all be eased with ice. Ice can also be used when recovering from surgery. Ice is effective, has very few side effects, and is safe for most people to use. PRECAUTIONS  Ice is not a safe treatment option for people with:  Raynaud phenomenon. This is a condition affecting small blood vessels in the extremities. Exposure to cold may cause your problems to return.  Cold hypersensitivity. There are many forms of cold hypersensitivity, including:  Cold urticaria. Red, itchy hives appear on the skin when the tissues begin to warm after being iced.  Cold erythema. This is a red, itchy rash caused by exposure to cold.  Cold hemoglobinuria. Red blood cells break down when the tissues begin to warm after being iced. The hemoglobin that carry oxygen are passed into the urine because they cannot combine with blood proteins fast enough.  Numbness or altered sensitivity in the area being iced. If you have any of the following conditions, do not use ice until you have discussed cryotherapy with your caregiver:  Heart conditions, such as arrhythmia, angina, or chronic heart disease.  High blood pressure.  Healing wounds or open skin in the area being iced.  Current infections.  Rheumatoid arthritis.  Poor circulation.  Diabetes. Ice slows the blood flow in the region it is applied. This is beneficial when trying to stop inflamed tissues from spreading irritating chemicals to surrounding tissues. However, if you expose your skin to cold  temperatures for too long or without the proper protection, you can damage your skin or nerves. Watch for signs of skin damage due to cold. HOME CARE INSTRUCTIONS Follow these tips to use ice and cold packs safely.  Place a dry or damp towel between the ice and skin. A damp towel will cool the skin more quickly, so you may need to shorten the time that the ice is used.  For a more rapid response, add gentle compression to the ice.  Ice for no more than 10 to 20 minutes at a time. The bonier the area you are icing, the less time it will take to get the benefits of ice.  Check your skin after 5 minutes to make sure there are no signs  of a poor response to cold or skin damage.  Rest 20 minutes or more between uses.  Once your skin is numb, you can end your treatment. You can test numbness by very lightly touching your skin. The touch should be so light that you do not see the skin dimple from the pressure of your fingertip. When using ice, most people will feel these normal sensations in this order: cold, burning, aching, and numbness.  Do not use ice on someone who cannot communicate their responses to pain, such as small children or people with dementia. HOW TO MAKE AN ICE PACK Ice packs are the most common way to use ice therapy. Other methods include ice massage, ice baths, and cryosprays. Muscle creams that cause a cold, tingly feeling do not offer the same benefits that ice offers and should not be used as a substitute unless recommended by your caregiver. To make an ice pack, do one of the following:  Place crushed ice or a bag of frozen vegetables in a sealable plastic bag. Squeeze out the excess air. Place this bag inside another plastic bag. Slide the bag into a pillowcase or place a damp towel between your skin and the bag.  Mix 3 parts water with 1 part rubbing alcohol. Freeze the mixture in a sealable plastic bag. When you remove the mixture from the freezer, it will be slushy.  Squeeze out the excess air. Place this bag inside another plastic bag. Slide the bag into a pillowcase or place a damp towel between your skin and the bag. SEEK MEDICAL CARE IF:  You develop white spots on your skin. This may give the skin a blotchy (mottled) appearance.  Your skin turns blue or pale.  Your skin becomes waxy or hard.  Your swelling gets worse. MAKE SURE YOU:   Understand these instructions.  Will watch your condition.  Will get help right away if you are not doing well or get worse.   This information is not intended to replace advice given to you by your health care provider. Make sure you discuss any questions you have with your health care provider.   Document Released: 09/02/2010 Document Revised: 01/27/2014 Document Reviewed: 09/02/2010 Elsevier Interactive Patient Education 2016 [employer].    Abrasion An abrasion is a cut or scrape on the outer surface of your skin. An abrasion does not extend through all of the layers of your skin. It is important to care for your abrasion properly to prevent infection. CAUSES Most abrasions are caused by falling on or gliding across the ground or another surface. When your skin rubs on something, the outer and inner layer of skin rubs off.  SYMPTOMS A cut or scrape is the main symptom of this condition. The scrape may be bleeding, or it may appear red or pink. If there was an associated fall, there may be an underlying bruise. DIAGNOSIS An abrasion is diagnosed with a physical exam. TREATMENT Treatment for this condition depends on how large and deep the abrasion is. Usually, your abrasion will be cleaned with water and mild soap. This removes any dirt or debris that may be stuck. An antibiotic ointment may be applied to the abrasion to help prevent infection. A bandage (dressing) may be placed on the abrasion to keep it clean. You may also need a tetanus shot. HOME CARE INSTRUCTIONS Medicines  Take or apply  medicines only as directed by your health care provider.  If you were prescribed an antibiotic ointment, finish  all of it even if you start to feel better. Wound Care  Clean the wound with mild soap and water 2-3 times per day or as directed by your health care provider. Pat your wound dry with a clean towel. Do not rub it.  There are many different ways to close and cover a wound. Follow instructions from your health care provider about:  Wound care.  Dressing changes and removal.  Check your wound every day for signs of infection. Watch for:  Redness, swelling, or pain.  Fluid, blood, or pus. General Instructions  Keep the dressing dry as directed by your health care provider. Do not take baths, swim, use a hot tub, or do anything that would put your wound underwater until your health care provider approves.  If there is swelling, raise (elevate) the injured area above the level of your heart while you are sitting or lying down.  Keep all follow-up visits as directed by your health care provider. This is important. SEEK MEDICAL CARE IF:  You received a tetanus shot and you have swelling, severe pain, redness, or bleeding at the injection site.  Your pain is not controlled with medicine.  You have increased redness, swelling, or pain at the site of your wound. SEEK IMMEDIATE MEDICAL CARE IF:  You have a red streak going away from your wound.  You have a fever.  You have fluid, blood, or pus coming from your wound.  You notice a bad smell coming from your wound or your dressing.   This information is not intended to replace advice given to you by your health care provider. Make sure you discuss any questions you have with your health care provider.   Document Released: 10/16/2004 Document Revised: 09/27/2014 Document Reviewed: 01/04/2014 Elsevier Interactive Patient Education 2016 [place] in the Home  Falls can cause injuries and can affect  people from all age groups. There are many simple things that you can do to make your home safe and to help prevent falls. WHAT CAN I DO ON THE OUTSIDE OF MY HOME?  Regularly repair the edges of walkways and driveways and fix any cracks.  Remove high doorway thresholds.  Trim any shrubbery on the main path into your home.  Use bright outdoor lighting.  Clear walkways of debris and clutter, including tools and rocks.  Regularly check that handrails are securely fastened and in good repair. Both sides of any steps should have handrails.  Install guardrails along the edges of any raised decks or porches.  Have leaves, snow, and ice cleared regularly.  Use sand or salt on walkways during winter months.  In the garage, clean up any spills right away, including grease or oil spills. WHAT CAN I DO IN THE BATHROOM?  Use night lights.  Install grab bars by the toilet and in the tub and shower. Do not use towel bars as grab bars.  Use non-skid mats or decals on the floor of the tub or shower.  If you need to sit down while you are in the shower, use a plastic, non-slip stool.[employer]  Keep the floor dry. Immediately clean up any water that spills on the floor.  Remove soap buildup in the tub or shower on a regular basis.  Attach bath mats securely with double-sided non-slip rug tape.  Remove throw rugs and other tripping hazards from the floor. WHAT CAN I DO IN THE BEDROOM?  Use night lights.  Make sure that a bedside  light is easy to reach.  Do not use oversized bedding that drapes onto the floor.  Have a firm chair that has side arms to use for getting dressed.  Remove throw rugs and other tripping hazards from the floor. WHAT CAN I DO IN THE KITCHEN?   Clean up any spills right away.  Avoid walking on wet floors.  Place frequently used items in easy-to-reach places.  If you need to reach for something above you, use a sturdy step stool that has a grab bar.  Keep  electrical cables out of the way.  Do not use floor polish or wax that makes floors slippery. If you have to use wax, make sure that it is non-skid floor wax.  Remove throw rugs and other tripping hazards from the floor. WHAT CAN I DO IN THE STAIRWAYS?  Do not leave any items on the stairs.  Make sure that there are handrails on both sides of the stairs. Fix handrails that are broken or loose. Make sure that handrails are as long as the stairways.  Check any carpeting to make sure that it is firmly attached to the stairs. Fix any carpet that is loose or worn.  Avoid having throw rugs at the top or bottom of stairways, or secure the rugs with carpet tape to prevent them from moving.  Make sure that you have a light switch at the top of the stairs and the bottom of the stairs. If you do not have them, have them installed. WHAT ARE SOME OTHER FALL PREVENTION TIPS?  Wear closed-toe shoes that fit well and support your feet. Wear shoes that have rubber soles or low heels.  When you use a stepladder, make sure that it is completely opened and that the sides are firmly locked. Have someone hold the ladder while you are using it. Do not climb a closed stepladder.  Add color or contrast paint or tape to grab bars and handrails in your home. Place contrasting color strips on the first and last steps.  Use mobility aids as needed, such as canes, walkers, scooters, and crutches.  Turn on lights if it is dark. Replace any light bulbs that burn out.  Set up furniture so that there are clear paths. Keep the furniture in the same spot.  Fix any uneven floor surfaces.  Choose a carpet design that does not hide the edge of steps of a stairway.  Be aware of any and all pets.  Review your medicines with your healthcare provider. Some medicines can cause dizziness or changes in blood pressure, which increase your risk of falling. Talk with your health care provider about other ways that you can  decrease your risk of falls. This may include working with a physical therapist or trainer to improve your strength, balance, and endurance.   This information is not intended to replace advice given to you by your health care provider. Make sure you discuss any questions you have with your health care provider.   Document Released: 12/27/2001 Document Revised: 05/23/2014 Document Reviewed: 02/10/2014 Elsevier Interactive Patient Education [employer].

## 2015-04-22 NOTE — ED Provider Notes (Signed)
CSN: [id number]     Arrival date & time 04/22/15  1907 History   First MD Initiated Contact with Patient 04/22/15 2030     Chief Complaint  Patient presents with  . Fall  . Arm Injury     (Consider location/radiation/quality/duration/timing/severity/associated sxs/prior Treatment) Patient is a 75 y.o. female presenting with fall and arm injury. The history is provided by the patient.  Fall Pertinent negatives include no headaches and no shortness of breath.  Arm Injury Associated symptoms: no back pain, no fever and no neck pain   Patient c/o trip and fall when hiking on a trail this afternoon. Tripped on a root. No faintness or dizziness prior to fall. Fell forward. Fell onto left arm. Patient denies bumping or hitting head, no loc, denies any headache. No neck or back pain. C/o pain to left upper arm. Pain is constant, dull,, mod-severe, worse w movement. No numbness/weakness. Felt fine, at baseline all day and prior to fall. Denies other pain or injury.  Is right hand dominant.  Tetanus up to date, per patient.         Past Medical History  Diagnosis Date  . DEGENERATIVE JOINT DISEASE, RIGHT HIP 02/16/2007  . POSTMENOPAUSAL SYNDROME 02/16/2007  . PREMATURE ATRIAL CONTRACTIONS 02/16/2007  . 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 Venous insufficiency   . Dupuytren's contracture   . Osteoarthritis of left knee   . Osteoarthritis of hip     right  . S/P breast biopsy, left     two o'clock position - benign   Past Surgical History  Procedure Laterality Date  . Breast biopsy Left    Family History  Problem Relation Age of Onset  . Cancer Father   . Alcohol abuse Father   . Cancer Mother   . Diabetes Mother   . Heart failure Mother   . Osteoarthritis Sister    Social History  Substance Use Topics  . Smoking status: Former Research scientist (life sciences)  . Smokeless tobacco: None  . Alcohol Use: No   OB History    No data available     Review of Systems  Constitutional: Negative for  fever.  HENT: Negative for nosebleeds.   Eyes: Negative for redness.  Respiratory: Negative for shortness of breath.   Gastrointestinal: Negative for vomiting.  Genitourinary: Negative for flank pain.  Musculoskeletal: Negative for back pain and neck pain.  Skin: Negative for rash.  Neurological: Negative for weakness, numbness and headaches.      Allergies  Sulfonamide derivatives  Home Medications   Prior to Admission medications   Medication Sig Start Date End Date Taking? Authorizing Provider  cholecalciferol (VITAMIN D) 1000 UNITS tablet Take 1,000 Units by mouth daily.    Historical Provider, MD  diltiazem (CARDIZEM CD) 180 MG 24 hr capsule TAKE ONE CAPSULE EVERY DAY 04/06/15   Dorena Cookey, MD  estradiol (ESTRACE) 0.1 MG/GM vaginal cream Place [id number] Applicatorfuls vaginally 2 (two) times a week. 03/16/14   Dorena Cookey, MD  ferrous sulfate 325 (65 FE) MG EC tablet Take 325 mg by mouth 2 (two) times a week.     Historical Provider, MD  Flaxseed, Linseed, (FLAX SEED OIL) 1000 MG CAPS Take 2 capsules by mouth daily.      Historical Provider, MD  Magnesium 250 MG TABS Take 1 tablet by mouth at bedtime.    Historical Provider, MD  Multiple Vitamin (MULTIVITAMIN) tablet Take 1 tablet by mouth daily.      Historical Provider, MD  rivaroxaban (XARELTO) 20 MG TABS tablet TAKE 1 TABLET BY MOUTH EVERY DAY 03/27/15   Larey Dresser, MD   BP 115/88 mmHg  Pulse 117  Temp(Src) 98.8 F (37.1 C)  Resp 16  SpO2 97% Physical Exam  Constitutional: She is oriented to person, place, and time. She appears well-developed and well-nourished. No distress.  HENT:  Head: Atraumatic.  Mouth/Throat: Oropharynx is clear and moist.  No facial or scalp sts, tenderness or bruising.   Eyes: Conjunctivae are normal. Pupils are equal, round, and reactive to light. No scleral icterus.  Neck: Neck supple. No tracheal deviation present.  Cardiovascular: Normal rate, regular rhythm, normal heart sounds and  intact distal pulses.   Pulmonary/Chest: Effort normal and breath sounds normal. No respiratory distress. She exhibits no tenderness.  Abdominal: Soft. Normal appearance and bowel sounds are normal. She exhibits no distension. There is no tenderness.  Musculoskeletal: She exhibits no edema.  CTLS spine, non tender, aligned, no step off. sts and tenderness proximal left humerus. Skin intact in area. No other focal pain or bony tenderness on bil ext exam. Distal pulses palp bil.  V superficial abrasion to left forearm.   Neurological: She is alert and oriented to person, place, and time.  Motor intact bil. LUE r/m/unerve fxn intact.   Skin: Skin is warm and dry. No rash noted. She is not diaphoretic.  Psychiatric: She has a normal mood and affect.  Nursing note and vitals reviewed.   ED Course  Procedures (including critical care time) Dg Humerus Left  04/22/2015  CLINICAL DATA:  Trip and fall with left shoulder pain, initial encounter EXAM: LEFT HUMERUS - 2+ VIEW COMPARISON:  None FINDINGS: There is a minimally impacted fracture through the surgical neck of the proximal left humerus. No other fracture is identified. No dislocation is seen. IMPRESSION: Proximal left humeral fracture Electronically Signed   By: Inez Catalina M.D.   On: 04/22/2015 20:50      I have personally reviewed and evaluated these images as part of my medical decision-making.    MDM   Xrays.  Reviewed nursing notes and prior charts for additional history.   Discussed xrays w pt.   Patient indicates she only wants tylenol ES for pain. Tylenol po given.   Icepack.  Patient has healing right wrist fracture from mid Feb - states no pain and is using hand normally, but will be in splint for a few more weeks.   Discussed caring for self, given bil arm injuries.  Patient indicates w her overall mobility, and friends helping she feels completely fine going home, does not want to stay.  Will consult CM, and make home  health referral for assistance/eval.   Pt w no head injury, or contusion, no headache. Is on xarelto, denies any abn bleeding or bruising.   Shoulder immobilizer placed.   Radial pulse 2+. No numbness. Pain improved.      Lajean Saver, MD 04/22/15 2122

## 2015-04-23 NOTE — Telephone Encounter (Signed)
Patient went to ER.  Dr Sherren Mocha aware.  Left message on machine on machine for patient

## 2015-04-23 NOTE — Telephone Encounter (Signed)
[facility] Night - Client TELEPHONE [facility] Patient Name: Stephanie Shaw Gender: Female DOB: 04/01/1940 Age: 75 Y 2 M 9 D Return Phone Number: [phone] (Primary) Address: City/State/Zip: [place] [address] Client [facility] Night - Client Client Site [facility] - Night Physician Christie Nottingham Contact Type Call Who Is Calling Patient / Member / Family / Caregiver Call Type Triage / Clinical Relationship To Patient Self Return Phone Number [phone] (Primary) Chief Complaint Shoulder Injury Reason for Call Symptomatic / Request for [place] states she fell just about an hour ago and hurt her left shoulder, hurts when she moves it at all. No other symptoms. PreDisposition Go to ED Translation No Nurse Assessment Nurse: Tawanna Solo, RN, Vaughan Basta Date/Time (Eastern Time): 04/22/2015 5:50:19 PM Confirm and document reason for call. If symptomatic, describe symptoms. You must click the next button to save text entered. ---Caller said she tripped and fell hurting her left shoulder. Caller says she is unable to move her shoulder. Caller is home alone and unable to remove her shirt. Caller broke her rt arm 2 months ago. Has the patient traveled out of the country within the last 30 days? ---No Does the patient have any new or worsening symptoms? ---Yes Will a triage be completed? ---Yes Related visit to physician within the last 2 weeks? ---No Does the PT have any chronic conditions? (i.e. diabetes, asthma, etc.) ---Yes List chronic conditions. ---Atrial Fib, takes xarelto, diltiazem Is this a behavioral health or substance abuse call? ---No Guidelines Guideline Title Affirmed Question Affirmed Notes Nurse Date/Time (Eastern Time) Shoulder Injury Can't move injured shoulder at all Irean Hong 04/22/2015 5:54:28 PM Disp. Time  Eilene Ghazi Time) Disposition Final User 04/22/2015 6:02:39 PM Go to ED Now Yes Tawanna Solo, RN, Vaughan Basta PLEASE NOTE: All timestamps contained within this report are represented as [place] Standard Time. CONFIDENTIALTY NOTICE: This fax transmission is intended only for the addressee. It contains information that is legally privileged, confidential or otherwise protected from use or disclosure. If you are not the intended recipient, you are strictly prohibited from reviewing, disclosing, copying using or disseminating any of this information or taking any action in reliance on or regarding this information. If you have received this fax in error, please notify us immediately by telephone so that we can arrange for its return to [place]. Phone: [phone], Toll-Free: [phone], Fax: [phone] Page: 2 of 2 Call Id: [id number] Caller Understands: Yes Disagree/Comply: Comply Care Advice Given Per Guideline GO TO ED NOW: You need to be seen in the Emergency Department. Go to the ER at ___________ [place] now. Drive carefully. FIRST AID ADVICE FOR SUSPECTED FRACTURE OR DISLOCATION OF THE SHOULDER: * Use a sling to support the arm. Make the sling with a triangular piece of cloth. DRIVING: Another adult should drive. NOTHING BY MOUTH: Do not eat or drink anything for now. (Reason: condition may need surgery and general anesthesia) CARE ADVICE given per Shoulder Injury (Adult) guideline. Comments User: Prescilla Sours, RN Date/Time Eilene Ghazi Time): 04/22/2015 6:02:08 PM Caller is unable to move her shoulder and has swelling. Unsure if caller has a fracture. User: Prescilla Sours, RN Date/Time Eilene Ghazi Time): 04/22/2015 6:02:35 PM patients daughter in law is on her was and will drive her to the hospital. [facility] - ED

## 2015-04-23 NOTE — Telephone Encounter (Signed)
Patient should schedule a follow up visit with Dr Sherren Mocha

## 2015-04-25 ENCOUNTER — Telehealth: Payer: Self-pay | Admitting: Family Medicine

## 2015-04-25 NOTE — Telephone Encounter (Signed)
Please work in at [place], 1:30, or 4:15 next week

## 2015-04-25 NOTE — Telephone Encounter (Signed)
Pt has been scheduled. She said she has an appt with otthapedic doctor Dr Mardelle Matte and is if she need to keep this appt .

## 2015-04-25 NOTE — Telephone Encounter (Signed)
Pt said she broke her arm and has spoken with you and need a fup visit. She call to make an appt for a fup and there is nothing next week.

## 2015-04-25 NOTE — Telephone Encounter (Signed)
Left message on machine for patient to call and schedule an appointment

## 2015-04-29 ENCOUNTER — Ambulatory Visit (INDEPENDENT_AMBULATORY_CARE_PROVIDER_SITE_OTHER): Payer: BC Managed Care – PPO | Admitting: Physician Assistant

## 2015-04-29 ENCOUNTER — Ambulatory Visit (HOSPITAL_COMMUNITY)
Admission: RE | Admit: 2015-04-29 | Discharge: 2015-04-29 | Disposition: A | Discharge status: Home / Self Care | Payer: BC Managed Care – PPO | Source: Ambulatory Visit | Attending: Physician Assistant | Admitting: Physician Assistant

## 2015-04-29 ENCOUNTER — Ambulatory Visit (HOSPITAL_COMMUNITY): Admission: RE | Admit: 2015-04-29 | Payer: BC Managed Care – PPO | Source: Ambulatory Visit

## 2015-04-29 ENCOUNTER — Telehealth: Payer: Self-pay | Admitting: Radiology

## 2015-04-29 ENCOUNTER — Ambulatory Visit (HOSPITAL_COMMUNITY)
Admission: AD | Admit: 2015-04-29 | Payer: BC Managed Care – PPO | Source: Ambulatory Visit | Attending: Emergency Medicine | Admitting: Emergency Medicine

## 2015-04-29 ENCOUNTER — Ambulatory Visit (HOSPITAL_COMMUNITY)
Admission: AD | Admit: 2015-04-29 | Discharge: 2015-04-29 | Disposition: A | Discharge status: Home / Self Care | Payer: BC Managed Care – PPO | Source: Ambulatory Visit | Attending: Family Medicine | Admitting: Family Medicine

## 2015-04-29 VITALS — BP 104/62 | HR 90 | Temp 97.3°F | Resp 20 | Ht 68.0 in | Wt 134.4 lb

## 2015-04-29 DIAGNOSIS — R58 Hemorrhage, not elsewhere classified: Secondary | ICD-10-CM

## 2015-04-29 DIAGNOSIS — R209 Unspecified disturbances of skin sensation: Secondary | ICD-10-CM

## 2015-04-29 DIAGNOSIS — R202 Paresthesia of skin: Secondary | ICD-10-CM

## 2015-04-29 LAB — POCT CBC
Granulocyte percent: 80.1 %G — AB (ref 37–80)
HCT, POC: 34 % — AB (ref 37.7–47.9)
HEMOGLOBIN: 11.7 g/dL — AB (ref 12.2–16.2)
LYMPH, POC: 0.8 (ref 0.6–3.4)
MCH: 32.1 pg — AB (ref 27–31.2)
MCHC: 34.6 g/dL (ref 31.8–35.4)
MCV: 92.8 fL (ref 80–97)
MID (cbc): 0.5 (ref 0–0.9)
MPV: 6.9 fL (ref 0–99.8)
POC GRANULOCYTE: 5.4 (ref 2–6.9)
POC LYMPH PERCENT: 12.2 %L (ref 10–50)
POC MID %: 7.7 %M (ref 0–12)
Platelet Count, POC: 181 10*3/uL (ref 142–424)
RBC: 3.66 M/uL — AB (ref 4.04–5.48)
RDW, POC: 14.7 %
WBC: 6.7 10*3/uL (ref 4.6–10.2)

## 2015-04-29 NOTE — Patient Instructions (Addendum)
Please go to [facility] for your scheduled CT.  Go to the Emergency Room and check in as an OUTPATIENT   Someone from the Radiology department will get you.  Please stay there after your CT for further instructions.    IF you received an x-ray today, you will receive an invoice from [facility] Radiology. Please contact [facility] Radiology at [phone] with questions or concerns regarding your invoice.   IF you received labwork today, you will receive an invoice from [employer]. Please contact [facility] at [phone] with questions or concerns regarding your invoice.   Our billing staff will not be able to assist you with questions regarding bills from these companies.  You will be contacted with the lab results as soon as they are available. The fastest way to get your results is to activate your My Chart account. Instructions are located on the last page of this paperwork. If you have not heard from [place] regarding the results in 2 weeks, please contact this office.

## 2015-04-29 NOTE — Progress Notes (Signed)
Subjective:   Patient ID: Stephanie Shaw, female     DOB: [birth year], 75 y.o.    MRN: [id number]  PCP: Joycelyn Man, MD  Chief Complaint  Patient presents with  . Arm Pain    pt c/o swelling to her left arm.  pt is on xarelto and is worried about bleeing in her arm. not feeling "right' today    HPI  Presents for evaluation of bruising of the LEFT arm..  7 days ago she sustained a fracture of the LEFT humerus when she tripped over a root and fell. Tried to protect the RIGHT wrist (sustained a fracture 02/2015) and caught herself with the LEFT arm. She was seen in the ED and placed in a sling and swath, and is to follow-up with orthopedics tomorrow.  She is concerned by the significant bruising of the LEFT arm. She is concerned that she is bleeding "too much."  Takes Xarelto for A fib.  "Thursday night when I was walking, I heard a snap in the back of my left leg and I have to be careful when I walk because it hurts." If she isn't careful and takes too big a step, or goes down steps, she feels pain in the lower calf, just above the achilles. She is concerned that this may be a drug related adverse effect. Just finished Vigamox eye drops, following cataract surgery. No other quinolones.  Today noticed that the LEFT leg felt tingly and warm. Feeling "funny" in general. "Not myself." Weak. Lack of energy. Not dropping things. Legs not giving way. Tingling and Warmth in the LEFT leg. The LEFT face feels different. No facial pain. Not dizzy. No headache. No slurred speech, facial droop, visual changes or confusion.    Prior to Admission medications   Medication Sig Start Date End Date Taking? Authorizing Provider  Bromfenac Sodium (PROLENSA) 0.07 % SOLN Place 1 drop into the right eye daily.   Yes Historical Provider, MD  cholecalciferol (VITAMIN D) 400 units TABS tablet Take 400 Units by mouth daily.   Yes Historical Provider, MD  Difluprednate (DUREZOL) 0.05 % EMUL  Place 1 drop into the right eye daily.   Yes Historical Provider, MD  diltiazem (CARDIZEM CD) 180 MG 24 hr capsule TAKE ONE CAPSULE EVERY DAY 04/06/15  Yes Dorena Cookey, MD  estradiol (ESTRACE) 0.1 MG/GM vaginal cream Place [id number] Applicatorfuls vaginally 2 (two) times a week. 03/16/14  Yes Dorena Cookey, MD  ferrous sulfate 325 (65 FE) MG EC tablet Take 325 mg by mouth 2 (two) times a week.    Yes Historical Provider, MD  Flaxseed, Linseed, (FLAX SEED OIL) 1000 MG CAPS Take 2 capsules by mouth 2 (two) times daily.    Yes Historical Provider, MD  Magnesium 250 MG TABS Take 1 tablet by mouth at bedtime.   Yes Historical Provider, MD  Multiple Vitamin (MULTIVITAMIN) tablet Take 1 tablet by mouth daily.     Yes Historical Provider, MD  rivaroxaban (XARELTO) 20 MG TABS tablet TAKE 1 TABLET BY MOUTH EVERY DAY 03/27/15  Yes Larey Dresser, MD  traMADol (ULTRAM) 50 MG tablet Take 1 tablet (50 mg total) by mouth every 6 (six) hours as needed. 04/22/15  Yes Lajean Saver, MD     Allergies  Allergen Reactions  . Sulfonamide Derivatives     REACTION: HIVES     Patient Active Problem List   Diagnosis Date Noted  . Colles' fracture of right radius 03/05/2015  . Hematuria 02/04/2014  .  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 Cervical pain (neck) 06/20/2010  . Atrial fibrillation ([place]) 05/21/2010  . 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 Menopausal and postmenopausal disorder 02/16/2007  . DEGENERATIVE JOINT DISEASE, RIGHT HIP 02/16/2007     Family History  Problem Relation Age of Onset  . Cancer Father   . Alcohol abuse Father   . Cancer Mother   . Diabetes Mother   . Heart failure Mother   . Osteoarthritis Sister      Social History   Social History  . Marital Status: Divorced    Spouse Name: N/A  . Number of Children: N/A  . Years of Education: N/A   Occupational History  . Not on file.   Social History Main Topics  . Smoking status: Former Research scientist (life sciences)  . Smokeless tobacco: Not on  file  . Alcohol Use: No  . Drug Use: Not on file  . Sexual Activity: Not on file   Other Topics Concern  . Not on file   Social History Narrative        Review of Systems       Objective:  Physical Exam  Constitutional: She is oriented to person, place, and time. She appears well-developed and well-nourished. She is active and cooperative. No distress.  BP 104/62 mmHg  Pulse 90  Temp(Src) 97.3 F (36.3 C) (Oral)  Resp 20  Ht 5\' 8"  (1.727 m)  Wt 134 lb 6 oz (60.952 kg)  BMI 20.44 kg/m2  SpO2 98%   Eyes: Conjunctivae are normal.  Pulmonary/Chest: Effort normal.  Musculoskeletal:       Left shoulder: She exhibits decreased range of motion, tenderness, bony tenderness and pain.       Left elbow: She exhibits swelling. She exhibits normal range of motion and no effusion.       Left wrist: Normal.       Left upper arm: She exhibits tenderness, bony tenderness and edema.       Left forearm: She exhibits swelling. She exhibits no tenderness and no bony tenderness.       Left hand: Normal.  Neurological: She is alert and oriented to person, place, and time.  Skin: Skin is warm and dry. Bruising and ecchymosis noted.  Significant ecchymosis of the arm from the shoulder to the distal forearm. Swelling is worst just proximal to the elbow. There is no increased warmth or induration. No erythema. Resolving bruising of the LEFT thigh and both knees, consistent with her fall.  Psychiatric: Her speech is normal and behavior is normal. Her mood appears anxious. Her affect is not angry, not blunt, not labile and not inappropriate. She does not exhibit a depressed mood.        Results for orders placed or performed in visit on 04/29/15  POCT CBC  Result Value Ref Range   WBC 6.7 4.6 - 10.2 K/uL   Lymph, poc 0.8 0.6 - 3.4   POC LYMPH PERCENT 12.2 10 - 50 %L   MID (cbc) 0.5 0 - 0.9   POC MID % 7.7 0 - 12 %M   POC Granulocyte 5.4 2 - 6.9   Granulocyte percent 80.1 (A) 37 - 80  %G   RBC 3.66 (A) 4.04 - 5.48 M/uL   Hemoglobin 11.7 (A) 12.2 - 16.2 g/dL   HCT, POC 34.0 (A) 37.7 - 47.9 %   MCV 92.8 80 - 97 fL   MCH, POC 32.1 (A) 27 - 31.2 pg   MCHC 34.6 31.8 -  35.4 g/dL   RDW, POC 14.7 %   Platelet Count, POC 181 142 - 424 K/uL   MPV 6.9 0 - 99.8 fL        Assessment & Plan:  1. Ecchymosis Consistent with proximal humerus fracture; resolving. Exacerbated by Xarelto for AFib. Mildly anemic today, about 2 points lower than 08/2014. This may explain her malaise. Follow-up with Dr. Sherren Mocha as planned on Tuesday. - POCT CBC  2. Paresthesia of left leg 3. Facial paresthesia Given her recent fall, use of Xarelto and these paresthesias, CT head today. Wait to take next Xarelto dose until she hears from me. If negative CT, she can follow-up with Dr. Sherren Mocha, as planned. - CT Head Wo Contrast; Future   Fara Chute, PA-C Physician Assistant-Certified [facility]

## 2015-04-29 NOTE — Telephone Encounter (Signed)
[facility] CT called patients CT head is normal. I  Have advised CT Technologist to advise patient study is normal and allow her to go home

## 2015-04-29 NOTE — Addendum Note (Signed)
Addended by: Fara Chute on: 04/29/2015 02:56 PM   Modules accepted: Level of Service

## 2015-04-29 NOTE — Progress Notes (Signed)
Report given Amy at [facility] she advise to send the patient home and that would call the patient later.  Message was relayed to patient and she stated she understood.

## 2015-04-30 ENCOUNTER — Telehealth: Payer: Self-pay | Admitting: Family Medicine

## 2015-04-30 NOTE — Telephone Encounter (Signed)
Pt seen at [facility] on 04/29/15   [place] Patient Name: Stephanie Shaw Gender: Female DOB: 07-28-1940 Age: 75 Y 2 M 16 D Return Phone Number: [phone] (Primary) Address: City/State/Zip: [place]  [address] Client [facility] Night - Client Client Site [facility] - Night Physician Christie Nottingham Contact Type Call Who Is Calling Patient / Member / Family / Caregiver Call Type Triage / Clinical Relationship To Patient Self Return Phone Number [phone] (Primary) Chief Complaint Dizziness Reason for Call Symptomatic / Request for Health Information Initial Comment Caller States her lower arm is very swollen and she is lightheaded, wants to know if she should stop taking her medication PreDisposition Call Doctor Translation No Nurse Assessment Nurse: Ruthann Cancer, RN, Apolonio Schneiders Date/Time (Eastern Time): 04/29/2015 9:16:39 AM Confirm and document reason for call. If symptomatic, describe symptoms. You must click the next button to save text entered. ---Caller says she broke the upper part of her arm. Her arm is in a sling. Her lower arm on the left side that she broke is really swollen with blood. She is not feeling "right". She is on xarelto. She doesn't feel like she is herself. She states her head is feeling "warm." Her arm is very bruised. Has the patient traveled out of the country within the last 30 days? ---Not Applicable Does the patient have any new or worsening symptoms? ---Yes Will a triage be completed? ---Yes Related visit to physician within the last 2 weeks? ---Yes Does the PT have any chronic conditions? (i.e. diabetes, asthma, etc.) ---Yes List chronic conditions. ---heart - a fib Is this a behavioral health or substance abuse call? ---No Guidelines Guideline Title Affirmed Question Affirmed Notes Nurse Date/Time (Eastern Time) Heart  Rate and Heartbeat Questions Age > 60 years (Exception: brief heart beat symptoms that went away and now feels well) Ruthann Cancer, RN, Apolonio Schneiders 04/29/2015 9:20:59 AM Disp. Time Eilene Ghazi Time) Disposition Final User PLEASE NOTE: All timestamps contained within this report are represented as [place] Standard Time. CONFIDENTIALTY NOTICE: This fax transmission is intended only for the addressee. It contains information that is legally privileged, confidential or otherwise protected from use or disclosure. If you are not the intended recipient, you are strictly prohibited from reviewing, disclosing, copying using or disseminating any of this information or taking any action in reliance on or regarding this information. If you have received this fax in error, please notify us immediately by telephone so that we can arrange for its return to [place]. Phone: [phone], Toll-Free: [phone], Fax: [phone] Page: 2 of 2 Call Id: [id number] 04/29/2015 9:32:08 AM See Physician within 4 Hours (or PCP triage) Yes Ruthann Cancer, RN, Herminio Heads Understands: Yes Disagree/Comply: Comply

## 2015-05-01 ENCOUNTER — Ambulatory Visit (INDEPENDENT_AMBULATORY_CARE_PROVIDER_SITE_OTHER): Payer: BC Managed Care – PPO | Admitting: Family Medicine

## 2015-05-01 ENCOUNTER — Encounter: Payer: Self-pay | Admitting: Family Medicine

## 2015-05-01 VITALS — BP 110/80 | Temp 99.1°F | Wt 134.0 lb

## 2015-05-01 DIAGNOSIS — S42209A Unspecified fracture of upper end of unspecified humerus, initial encounter for closed fracture: Secondary | ICD-10-CM

## 2015-05-01 DIAGNOSIS — S42202D Unspecified fracture of upper end of left humerus, subsequent encounter for fracture with routine healing: Secondary | ICD-10-CM

## 2015-05-01 HISTORY — DX: Unspecified fracture of upper end of unspecified humerus, initial encounter for closed fracture: S42.209A

## 2015-05-01 NOTE — Progress Notes (Signed)
Pre visit review using our clinic review tool, if applicable. No additional management support is needed unless otherwise documented below in the visit note. 

## 2015-05-01 NOTE — Patient Instructions (Addendum)
Follow-up with Dr. Joni Fears in 4-6 weeks  Continue ice as much as possible  Continue your splint  1200 mg of calcium and 800 units of vitamin D daily

## 2015-05-01 NOTE — Progress Notes (Signed)
   Subjective:    Patient ID: Stephanie Shaw, female    DOB: [birth year], 75 y.o.   MRN: [id number]  HPI Stephanie Shaw is a 75 year old single female nonsmoker who comes in today for follow-up of a fracture of her left proximal humerus through the surgical neck. This was diagnosed on April 2. She fell walking through the woods. It was felt she didn't need any orthopedic evaluation at the time. He put a splint on. She did have a lot of bleeding because she's on a blood thinner because she has chronic A. fib.  She went to [facility] on Sunday because she felt bad. The end up sending emergently to CT scan of her brain which was normal.   Review of Systems Review of systems negative except for healing fracture right wrist    Objective:   Physical Exam  Well-developed well-nourished female no acute distress examination left shoulder shows the bleeding to be stopped but it involves her left upper anterior chest wall upper arm and lower arm.      Assessment & Plan:  Left proximal humeral fracture........ Splint....... Ice....... patient requesting no more than Tylenol....... follow-up with Dr. Durward Fortes in 4-6 weeks

## 2015-05-15 ENCOUNTER — Other Ambulatory Visit: Payer: Self-pay | Admitting: Family Medicine

## 2015-05-16 MED FILL — ESTRACE 0.01% CREAM: 0.1 | 90 days supply | Qty: 43 | Fill #0

## 2015-06-14 ENCOUNTER — Other Ambulatory Visit (INDEPENDENT_AMBULATORY_CARE_PROVIDER_SITE_OTHER): Payer: BC Managed Care – PPO | Admitting: *Deleted

## 2015-06-14 DIAGNOSIS — I4891 Unspecified atrial fibrillation: Secondary | ICD-10-CM | POA: Diagnosis not present

## 2015-06-14 DIAGNOSIS — I481 Persistent atrial fibrillation: Secondary | ICD-10-CM

## 2015-06-14 DIAGNOSIS — I4819 Other persistent atrial fibrillation: Secondary | ICD-10-CM

## 2015-06-14 LAB — BASIC METABOLIC PANEL
BUN: 20 mg/dL (ref 7–25)
CO2: 28 mmol/L (ref 20–31)
Calcium: 9.6 mg/dL (ref 8.6–10.4)
Chloride: 105 mmol/L (ref 98–110)
Creat: 0.92 mg/dL (ref 0.60–0.93)
Glucose, Bld: 82 mg/dL (ref 65–99)
POTASSIUM: 4.8 mmol/L (ref 3.5–5.3)
SODIUM: 140 mmol/L (ref 135–146)

## 2015-06-14 LAB — CBC WITH DIFFERENTIAL/PLATELET
BASOS PCT: 1 %
Basophils Absolute: 63 cells/uL (ref 0–200)
Eosinophils Absolute: 252 cells/uL (ref 15–500)
Eosinophils Relative: 4 %
HEMATOCRIT: 40.4 % (ref 35.0–45.0)
Hemoglobin: 13.7 g/dL (ref 11.7–15.5)
LYMPHS PCT: 14 %
Lymphs Abs: 882 cells/uL (ref 850–3900)
MCH: 31.7 pg (ref 27.0–33.0)
MCHC: 33.9 g/dL (ref 32.0–36.0)
MCV: 93.5 fL (ref 80.0–100.0)
MONO ABS: 819 {cells}/uL (ref 200–950)
MONOS PCT: 13 %
MPV: 10.1 fL (ref 7.5–12.5)
NEUTROS ABS: 4284 {cells}/uL (ref 1500–7800)
Neutrophils Relative %: 68 %
PLATELETS: 191 10*3/uL (ref 140–400)
RBC: 4.32 MIL/uL (ref 3.80–5.10)
RDW: 13.3 % (ref 11.0–15.0)
WBC: 6.3 10*3/uL (ref 3.8–10.8)

## 2015-06-14 NOTE — Addendum Note (Signed)
Addended by: Eulis Foster on: 06/14/2015 08:53 AM   Modules accepted: Orders

## 2015-06-14 NOTE — Addendum Note (Signed)
Addended by: Eulis Foster on: 06/14/2015 08:52 AM   Modules accepted: Orders

## 2015-06-19 ENCOUNTER — Ambulatory Visit
Admission: RE | Admit: 2015-06-19 | Discharge: 2015-06-19 | Disposition: A | Discharge status: Home / Self Care | Payer: BC Managed Care – PPO | Source: Ambulatory Visit | Attending: Family Medicine | Admitting: Family Medicine

## 2015-06-19 DIAGNOSIS — R921 Mammographic calcification found on diagnostic imaging of breast: Secondary | ICD-10-CM

## 2015-06-21 ENCOUNTER — Other Ambulatory Visit: Payer: Self-pay | Admitting: Cardiology

## 2015-06-22 MED FILL — XARELTO 20 MG TABLET: 20 | 30 days supply | Qty: 30 | Fill #0

## 2015-06-25 ENCOUNTER — Telehealth: Payer: Self-pay | Admitting: Family Medicine

## 2015-06-25 DIAGNOSIS — N959 Unspecified menopausal and perimenopausal disorder: Secondary | ICD-10-CM

## 2015-06-25 DIAGNOSIS — Z8781 Personal history of (healed) traumatic fracture: Secondary | ICD-10-CM

## 2015-06-25 NOTE — Telephone Encounter (Signed)
Okay 

## 2015-06-25 NOTE — Telephone Encounter (Signed)
Dr.K, okay to order Dexa for Dr. Honor Junes pt. Hx of fracture.

## 2015-06-25 NOTE — Telephone Encounter (Signed)
Order put in [facility] for Dexa scan, please schedule pt at [facility].

## 2015-06-25 NOTE — Telephone Encounter (Signed)
Pt would like to have bone density test. Pt last test was about 10 years ago. Pt does not remember when or where she had last one. Can I sch one at [facility]?

## 2015-06-26 NOTE — Telephone Encounter (Signed)
lmom for pt to call back

## 2015-06-27 ENCOUNTER — Encounter: Payer: Self-pay | Admitting: Nurse Practitioner

## 2015-06-27 ENCOUNTER — Ambulatory Visit (INDEPENDENT_AMBULATORY_CARE_PROVIDER_SITE_OTHER): Payer: BC Managed Care – PPO | Admitting: Nurse Practitioner

## 2015-06-27 VITALS — BP 108/76 | HR 89 | Ht 68.0 in | Wt 131.8 lb

## 2015-06-27 DIAGNOSIS — I481 Persistent atrial fibrillation: Secondary | ICD-10-CM | POA: Diagnosis not present

## 2015-06-27 DIAGNOSIS — Z7901 Long term (current) use of anticoagulants: Secondary | ICD-10-CM | POA: Diagnosis not present

## 2015-06-27 DIAGNOSIS — I4819 Other persistent atrial fibrillation: Secondary | ICD-10-CM

## 2015-06-27 MED ORDER — RIVAROXABAN 20 MG PO TABS: 20.0000 mg | ORAL_TABLET | Freq: Every day | ORAL | Status: DC

## 2015-06-27 NOTE — Patient Instructions (Addendum)
We will be checking the following labs today - NONE   Medication Instructions:    Continue with your current medicines.   I have refilled your Xarelto today    Testing/Procedures To Be Arranged:  N/A  Follow-Up:   See Dr. Aundra Dubin in one year    Other Special Instructions:   N/A    If you need a refill on your cardiac medications before your next appointment, please call your pharmacy.   Call the [place] office at [phone] if you have any questions, problems or concerns.

## 2015-06-27 NOTE — Progress Notes (Signed)
CARDIOLOGY OFFICE NOTE  Date:  06/27/2015    Stephanie Shaw Date of Birth: Feb 12, 1940 Medical Record [id number]  PCP:  Joycelyn Man, MD  Cardiologist:  Aundra Dubin    Chief Complaint  Patient presents with  . Atrial Fibrillation    One year check - seen for Dr. Aundra Dubin    History of Present Illness: Stephanie Shaw is a 75 y.o. female who presents today for a one year check. Seen for Dr. Aundra Dubin.   She has a history of chronic atrial fibrillation. Patient has had prior history of PACs, but atrial fibrillation was noted for the first time on 4/12. She seems to have remained in atrial fibrillation since that time.  She is now taking diltiazem CD and Xarelto. She teaches at [employer].  Seen a year ago and was felt to be doing ok.  Comes back today. Here alone. She continues to do ok. She has no real awareness of her atrial fib. She has had 2 falls earlier this year - tripped both time - broke an arm each time. Will be getting bone scan set up. Just now really getting back to her baseline. She says she clearly tripped. No passing out. No chest pain. Not short of breath. Needs her Xarelto refilled.   PMH:  1. Atrial fibrillation: First noted on 05/13/10. Had PACs noted prior to this. Persistent. Echo (5/12): EF 55%, normal wall thickness, normal valves, moderate biatrial enlargement, normal RV.  2. Duputren's contractures 3. Osteoarthritis 4. Venous insufficiency 5. Multinodular goiter: Nontoxic.   Past Medical History  Diagnosis Date  . DEGENERATIVE JOINT DISEASE, RIGHT HIP 02/16/2007  . POSTMENOPAUSAL SYNDROME 02/16/2007  . PREMATURE ATRIAL CONTRACTIONS 02/16/2007  . 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 Venous insufficiency   . Dupuytren's contracture   . Osteoarthritis of left knee   . Osteoarthritis of hip     right  . S/P breast biopsy, left     two o'clock position - benign  . Colles' fracture of right radius 03/05/2015    Past Surgical History  Procedure  Laterality Date  . Breast biopsy Left      Medications: Current Outpatient Prescriptions  Medication Sig Dispense Refill  . Bromfenac Sodium (PROLENSA) 0.07 % SOLN Place 1 drop into the right eye daily.    . cholecalciferol (VITAMIN D) 400 units TABS tablet Take 400 Units by mouth daily.    . Difluprednate (DUREZOL) 0.05 % EMUL Place 1 drop into the right eye daily.    Marland Kitchen diltiazem (CARDIZEM CD) 180 MG 24 hr capsule TAKE ONE CAPSULE EVERY DAY 90 capsule 3  . ESTRACE VAGINAL 0.1 MG/GM vaginal cream INSERT 1/4 OF APPLICATORFUL VAGINALLY TWICE PER WEEK 42.5 g 11  . ferrous sulfate 325 (65 FE) MG EC tablet Take 325 mg by mouth 2 (two) times a week.     . Flaxseed, Linseed, (FLAX SEED OIL) 1000 MG CAPS Take 2 capsules by mouth 2 (two) times daily.     . Magnesium 250 MG TABS Take 1 tablet by mouth at bedtime.    . Multiple Vitamin (MULTIVITAMIN) tablet Take 1 tablet by mouth daily.      . rivaroxaban (XARELTO) 20 MG TABS tablet Take 1 tablet (20 mg total) by mouth daily. 90 tablet 3  . traMADol (ULTRAM) 50 MG tablet Take 1 tablet (50 mg total) by mouth every 6 (six) hours as needed. 20 tablet 0   No current facility-administered medications for this visit.  Allergies: Allergies  Allergen Reactions  . Sulfonamide Derivatives     REACTION: HIVES    Social History: The patient  reports that she has quit smoking. She does not have any smokeless tobacco history on file. She reports that she does not drink alcohol.   Family History: The patient's family history includes Alcohol abuse in her father; Cancer in her father and mother; Diabetes in her mother; Heart failure in her mother; Osteoarthritis in her sister.   Review of Systems: Please see the history of present illness.   Otherwise, the review of systems is positive for none.   All other systems are reviewed and negative.   Physical Exam: VS:  BP 108/76 mmHg  Pulse 89  Ht 5\' 8"  (1.727 m)  Wt 131 lb 12.8 oz (59.784 kg)  BMI  20.04 kg/m2 .  BMI Body mass index is 20.04 kg/(m^2).  Wt Readings from Last 3 Encounters:  06/27/15 131 lb 12.8 oz (59.784 kg)  05/01/15 134 lb (60.782 kg)  04/29/15 134 lb 6 oz (60.952 kg)    General: Pleasant. Elderly female. She is alert and in no acute distress.  HEENT: Normal. Neck: Supple, no JVD, carotid bruits, or masses noted.  Cardiac: Irregular irregular rhythm. Rate is ok. No edema.  Respiratory:  Lungs are clear to auscultation bilaterally with normal work of breathing.  GI: Soft and nontender.  MS: No deformity or atrophy. Gait and ROM intact. Skin: Warm and dry. Color is normal.  Neuro:  Strength and sensation are intact and no gross focal deficits noted.  Psych: Alert, appropriate and with normal affect.   LABORATORY DATA:  EKG:  EKG is ordered today. This demonstrates atrial fib with a controlled VR of 89.  Lab Results  Component Value Date   WBC 6.3 06/14/2015   HGB 13.7 06/14/2015   HCT 40.4 06/14/2015   PLT 191 06/14/2015   GLUCOSE 82 06/14/2015   CHOL 211* 03/09/2014   TRIG 32.0 03/09/2014   HDL 91.00 03/09/2014   LDLDIRECT 104.2 01/25/2013   LDLCALC 114* 03/09/2014   ALT 16 03/09/2014   AST 23 03/09/2014   NA 140 06/14/2015   K 4.8 06/14/2015   CL 105 06/14/2015   CREATININE 0.92 06/14/2015   BUN 20 06/14/2015   CO2 28 06/14/2015   TSH 0.80 03/09/2014    BNP (last 3 results) No results for input(s): BNP in the last 8760 hours.  ProBNP (last 3 results) No results for input(s): PROBNP in the last 8760 hours.   Other Studies Reviewed Today:  Echo Study Conclusions from 05/2010  - Left ventricle: The cavity size was normal. Wall thickness was normal. Systolic function was normal. The estimated ejection fraction was 55%. Indeterminant diastolic function. Wall motion was normal; there were no regional wall motion abnormalities. - Aortic valve: There was no stenosis. - Mitral valve: Mild regurgitation. - Left atrium: The atrium was  moderately dilated. - Right ventricle: The cavity size was normal. Systolic function was normal. - Right atrium: The atrium was moderately dilated. - Pulmonary arteries: PA systolic pressure [id number] mmHg. - Systemic veins: IVC measured 2.2 cm with normal respirophasic variation, suggesting RA pressure 6-10 mmHg. Impressions:  - The patient was in atrial fibrillation. Normal LV size and systolic function, EF [id number]. Moderate biatrial enlargement. Normal RV size and systolic function.   Assessment/Plan:  Atrial fibrillation  Patient has minimally symptomatic persistent atrial fibrillation. CHADSVASC score is 2 (female gender, age > 63). Her mother had atrial fibrillation. Past  Echo showed preserved LV function and no significant valvular dysfunction.  - She will continue diltiazem CD and Xarelto. Her recent labs are stable. I have refilled her Xarelto.   Falls - no active syncope but still concerning given that she is on Xarelto. Understands the need for safety. If would persist would need to readdress her anticoagulation.   Current medicines are reviewed with the patient today.  The patient does not have concerns regarding medicines other than what has been noted above.  The following changes have been made:  See above.  Labs/ tests ordered today include:   No orders of the defined types were placed in this encounter.     Disposition:   FU with Dr. Aundra Dubin in one year.    Patient is agreeable to this plan and will call if any problems develop in the interim.   Signed: Burtis Junes, RN, ANP-C 06/27/2015 3:04 PM  [facility] [address] [place], [place]  [address] Phone: [phone] Fax: [phone]

## 2015-06-28 NOTE — Telephone Encounter (Signed)
lmom for pt to call back

## 2015-06-29 NOTE — Telephone Encounter (Signed)
Pt has been sch

## 2015-07-03 ENCOUNTER — Ambulatory Visit (INDEPENDENT_AMBULATORY_CARE_PROVIDER_SITE_OTHER)
Admission: RE | Admit: 2015-07-03 | Discharge: 2015-07-03 | Disposition: A | Discharge status: Home / Self Care | Payer: BC Managed Care – PPO | Source: Ambulatory Visit | Attending: Family Medicine | Admitting: Family Medicine

## 2015-07-03 DIAGNOSIS — N959 Unspecified menopausal and perimenopausal disorder: Secondary | ICD-10-CM | POA: Diagnosis not present

## 2015-07-03 DIAGNOSIS — Z8781 Personal history of (healed) traumatic fracture: Secondary | ICD-10-CM

## 2015-07-08 DIAGNOSIS — Z8781 Personal history of (healed) traumatic fracture: Secondary | ICD-10-CM | POA: Diagnosis not present

## 2015-07-08 DIAGNOSIS — N959 Unspecified menopausal and perimenopausal disorder: Secondary | ICD-10-CM | POA: Diagnosis not present

## 2015-07-13 ENCOUNTER — Telehealth: Payer: Self-pay | Admitting: Family Medicine

## 2015-07-13 NOTE — Telephone Encounter (Signed)
Dr.K, can you review Bone Density results and advise due to  Dr.Todd is out of the office. Thanks

## 2015-07-13 NOTE — Telephone Encounter (Signed)
Spoke to pt, told her Bone Density revealed osteopenia. Continue calcium and vitamin D supplements and regular exercise regimen per Dr. Raliegh Ip and will  Repeat bone density in 2-3 years. Pt verbalized understanding.

## 2015-07-13 NOTE — Telephone Encounter (Signed)
Please call/notify patient that lab/test/procedure revealed osteopenia.  Continue calcium and vitamin D supplements and regular exercise regimen.  Repeat bone density in 2-3 years

## 2015-07-13 NOTE — Telephone Encounter (Signed)
Patient would like the results of the Bone Density test she had done on 07/03/15.

## 2015-07-19 MED FILL — XARELTO 20 MG TABLET: 20 | 30 days supply | Qty: 30 | Fill #0

## 2015-07-26 ENCOUNTER — Telehealth: Payer: Self-pay | Admitting: Family Medicine

## 2015-07-26 ENCOUNTER — Ambulatory Visit (INDEPENDENT_AMBULATORY_CARE_PROVIDER_SITE_OTHER): Payer: BC Managed Care – PPO | Admitting: Family Medicine

## 2015-07-26 ENCOUNTER — Encounter: Payer: Self-pay | Admitting: Family Medicine

## 2015-07-26 VITALS — BP 92/70 | HR 102 | Temp 98.4°F | Resp 12 | Ht 68.0 in | Wt 132.0 lb

## 2015-07-26 DIAGNOSIS — I482 Chronic atrial fibrillation, unspecified: Secondary | ICD-10-CM

## 2015-07-26 DIAGNOSIS — L03115 Cellulitis of right lower limb: Secondary | ICD-10-CM | POA: Diagnosis not present

## 2015-07-26 MED ORDER — DOXYCYCLINE HYCLATE 100 MG PO TABS: 100.0000 mg | ORAL_TABLET | Freq: Two times a day (BID) | ORAL | Status: DC

## 2015-07-26 MED FILL — DOXYCYCLINE HYCLATE 100 MG: 100 | 7 days supply | Qty: 14 | Fill #0

## 2015-07-26 NOTE — Progress Notes (Signed)
Pre visit review using our clinic review tool, if applicable. No additional management support is needed unless otherwise documented below in the visit note. 

## 2015-07-26 NOTE — Progress Notes (Signed)
HPI:  ACUTE VISIT:  Chief Complaint  Patient presents with  . Leg wound    right lower leg, pt has been putting neosporin on it, it is red and inflammed.    [name] is a 75 y.o. female, who is here today complaining of "inflammed" area on RLE.  About 6 days ago while she was hiking she scratched right distal leg against a branch, once she got home she clean area with Peroxide and applied abx oint. She has noted worsening erythema and tenderness, no drainage and no associated joint pain. No fever, chills, myalgias, or decreased appetite. No numbness or tingling.  Noted her BP mildly low and elevated HR. She follows with cardiologists, last seen about a month ago, her BP is "always" low. Hx of atrial fib.  Denies severe/frequent headache, visual changes, chest pain, dyspnea, dizziness, claudication, focal weakness, or edema.    Review of Systems  Constitutional: Negative for fever, chills, appetite change and fatigue.  Respiratory: Negative for cough, shortness of breath and wheezing.   Cardiovascular: Negative for chest pain and leg swelling.  Gastrointestinal: Negative for nausea, vomiting, abdominal pain and diarrhea.  Musculoskeletal: Negative for myalgias, joint swelling and gait problem.  Skin: Positive for rash and wound.  Neurological: Negative for dizziness, syncope, weakness, numbness and headaches.      Current Outpatient Prescriptions on File Prior to Visit  Medication Sig Dispense Refill  . Bromfenac Sodium (PROLENSA) 0.07 % SOLN Place 1 drop into the right eye daily.    . cholecalciferol (VITAMIN D) 400 units TABS tablet Take 400 Units by mouth daily.    . Difluprednate (DUREZOL) 0.05 % EMUL Place 1 drop into the right eye daily.    Marland Kitchen diltiazem (CARDIZEM CD) 180 MG 24 hr capsule TAKE ONE CAPSULE EVERY DAY 90 capsule 3  . ESTRACE VAGINAL 0.1 MG/GM vaginal cream INSERT 1/4 OF APPLICATORFUL VAGINALLY TWICE PER WEEK 42.5 g 11  . ferrous sulfate  325 (65 FE) MG EC tablet Take 325 mg by mouth 2 (two) times a week.     . Flaxseed, Linseed, (FLAX SEED OIL) 1000 MG CAPS Take 2 capsules by mouth 2 (two) times daily.     . Magnesium 250 MG TABS Take 1 tablet by mouth at bedtime.    . Multiple Vitamin (MULTIVITAMIN) tablet Take 1 tablet by mouth daily.      . rivaroxaban (XARELTO) 20 MG TABS tablet Take 1 tablet (20 mg total) by mouth daily. 90 tablet 3  . traMADol (ULTRAM) 50 MG tablet Take 1 tablet (50 mg total) by mouth every 6 (six) hours as needed. 20 tablet 0   No current facility-administered medications on file prior to visit.     Past Medical History  Diagnosis Date  . DEGENERATIVE JOINT DISEASE, RIGHT HIP 02/16/2007  . POSTMENOPAUSAL SYNDROME 02/16/2007  . PREMATURE ATRIAL CONTRACTIONS 02/16/2007  . 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 Venous insufficiency   . Dupuytren's contracture   . Osteoarthritis of left knee   . Osteoarthritis of hip     right  . S/P breast biopsy, left     two o'clock position - benign  . Colles' fracture of right radius 03/05/2015   Allergies  Allergen Reactions  . Sulfonamide Derivatives     REACTION: HIVES    Social History   Social History  . Marital Status: Divorced    Spouse Name: N/A  . Number of Children: N/A  . Years of Education:  N/A   Social History Main Topics  . Smoking status: Former Research scientist (life sciences)  . Smokeless tobacco: None  . Alcohol Use: No  . Drug Use: None  . Sexual Activity: Not Asked   Other Topics Concern  . None   Social History Narrative    Filed Vitals:   07/26/15 1441  BP: 92/70  Pulse: 102  Temp: 98.4 F (36.9 C)  Resp: 12   Body mass index is 20.08 kg/(m^2).   SpO2 Readings from Last 3 Encounters:  07/26/15 96%  04/29/15 98%  04/22/15 97%      Physical Exam  Nursing note and vitals reviewed. Constitutional: She is oriented to person, place, and time. She appears well-developed and well-nourished. No distress.  HENT:  Head: Atraumatic.    Eyes: Conjunctivae are normal.  Cardiovascular: An irregular rhythm present. Tachycardia present.   No murmur heard. Pulses:      Dorsalis pedis pulses are 2+ on the right side.  Respiratory: Effort normal and breath sounds normal. No respiratory distress.  GI: Soft. She exhibits no mass. There is no tenderness.  Musculoskeletal: She exhibits no edema.  Lymphadenopathy:    She has no cervical adenopathy.  Neurological: She is alert and oriented to person, place, and time.  Skin: Skin is warm. Lesion noted. There is erythema. No cyanosis.     2-3 mm superficial laceration with brownish crust, surrounded by erythema and local heat, 3-4 cm, no induration, mild pain and local edema.  Psychiatric: She has a normal mood and affect.  Well groomed, good eye contact.      ASSESSMENT AND PLAN:     Stephanie Shaw was seen today for leg wound.  Diagnoses and all orders for this visit:  Cellulitis of leg, right  Keep area clean with soap and water. Oral abx recommended. Local heat for 2-3 days may help. Instructed about warning signs. F/U as needed.  -     doxycycline (VIBRA-TABS) 100 MG tablet; Take 1 tablet (100 mg total) by mouth 2 (two) times daily. With food  Chronic atrial fibrillation (Stephanie Shaw)  She denies any symptom. HR initially 119/min, and 102/min at the time of the discharge. She was recommended to monitor blood pressure and heart rate at home and continue following with cardiologist as recommended.      -[name] advised to return or notify a doctor immediately if symptoms worsen or persist or new concerns arise, she voices understanding       Maleeya Peterkin G. [name], MD  [facility]. [place] office.

## 2015-07-26 NOTE — Patient Instructions (Addendum)
A few things to remember from today's visit:   [name] I have seen you today for an acute visit because your primary care provider was not available. Monitor for signs of worsening symptoms and seek immediate medical attention if any concerning/warning symptom as we discussed. If symptoms are not resolved in 1-2 weeks you should schedule a follow up appointment with your doctor, before if symptoms get worse.  Please continue following with your PCP for your other chronic medical problems and be sure you have an appointment already scheduled.   Cellulitis of leg, right - Plan: doxycycline (VIBRA-TABS) 100 MG tablet  Mild infection of skin.  Elevation of leg above heart level. Local heat.  Your blood pressure is slightly low today, please monitor blood pressure at home. Continue following with heart doctor is recommended.   Please be sure medication list is accurate. If a new problem present, please set up appointment sooner than planned today.

## 2015-07-26 NOTE — Telephone Encounter (Signed)
Patient Name: Stephanie Shaw  DOB: [birth year]    Initial Comment Scratched leg last week and it is getting all red    Nurse Assessment  Nurse: Raphael Gibney, RN, Vera Date/Time (Eastern Time): 07/26/2015 10:31:20 AM  Confirm and document reason for call. If symptomatic, describe symptoms. You must click the next button to save text entered. ---Caller states she scratched her right lower eg last Friday riding a bike. Has red around the area. Small amount of swelling. Not painful.  Has the patient traveled out of the country within the last 30 days? ---Not Applicable  Does the patient have any new or worsening symptoms? ---Yes  Will a triage be completed? ---Yes  Related visit to physician within the last 2 weeks? ---No  Does the PT have any chronic conditions? (i.e. diabetes, asthma, etc.) ---Yes  List chronic conditions. ---A fib  Is this a behavioral health or substance abuse call? ---No     Guidelines    Guideline Title Affirmed Question Affirmed Notes  Wound Infection [1] Red area or streak AND [2] no fever    Final Disposition User   See Physician within 24 Hours [place], RN, Vera    Comments  appt scheduled for 07/26/2015 at 2:45 pm with Dr. Betty [name]   Referrals  REFERRED TO PCP OFFICE   Disagree/Comply: Comply

## 2015-08-01 ENCOUNTER — Telehealth: Payer: Self-pay | Admitting: Family Medicine

## 2015-08-01 ENCOUNTER — Ambulatory Visit (INDEPENDENT_AMBULATORY_CARE_PROVIDER_SITE_OTHER): Payer: BC Managed Care – PPO | Admitting: Family Medicine

## 2015-08-01 ENCOUNTER — Encounter: Payer: Self-pay | Admitting: Family Medicine

## 2015-08-01 VITALS — BP 104/64 | HR 91 | Temp 98.2°F | Resp 12 | Ht 68.0 in | Wt 131.0 lb

## 2015-08-01 DIAGNOSIS — R112 Nausea with vomiting, unspecified: Secondary | ICD-10-CM | POA: Diagnosis not present

## 2015-08-01 DIAGNOSIS — R197 Diarrhea, unspecified: Secondary | ICD-10-CM | POA: Diagnosis not present

## 2015-08-01 MED ORDER — ONDANSETRON HCL 4 MG PO TABS: 4.0000 mg | ORAL_TABLET | Freq: Three times a day (TID) | ORAL | Status: DC | PRN

## 2015-08-01 MED FILL — ONDANSETRON HCL 4 MG TABLET: 4 | 5 days supply | Qty: 15 | Fill #0

## 2015-08-01 NOTE — Progress Notes (Signed)
HPI:  ACUTE VISIT:  Chief Complaint  Patient presents with  . Diarrhea from Doxy    Pt had a dizzy spell and threw up around 5pm yesterday. She woke up during the night, dizzy. Pt felt out of balance this morning, and she has had an upset stomach.     Stephanie Shaw is a 75 y.o. female, who is here today complaining of 1-2 days of nausea, vomiting, and diarrhea.  Yesterday afternoon she started with nausea and had 1 episode of vomiting. She has also had loose stools for the past 2 days. I saw her on 07/26/2015 because mild cellulitis after leg injury, I recommended Doxycycline twice per day, which she tolerated well until yesterday; he took last dose yesterday morning. She thinks symptoms are attributed to antibiotic. Yesterday she fell dizzy, "spinning" sensation, resolved. No prior history of vertigo, chronic tinnitus, unchanged.  Right lower extremity erythema and tenderness, for which antibiotic was recommended have resolved.  Denies fever, chills, decreased appetite,abdominal pain,heartburn, blood in stool, or or melena. No urinary symptoms. No sick contact or suspicious food intake.   Today she has had 2 episodes of loose stool, no vomiting, still nauseated(" upset stomach").    Review of Systems  Constitutional: Negative for fever, chills, appetite change, fatigue and unexpected weight change.  HENT: Positive for tinnitus. Negative for congestion, mouth sores, sore throat and trouble swallowing.   Respiratory: Negative for cough, chest tightness, shortness of breath and wheezing.   Cardiovascular: Negative for chest pain and palpitations.  Gastrointestinal: Positive for nausea, vomiting and diarrhea. Negative for abdominal pain and blood in stool.  Genitourinary: Negative for dysuria, hematuria and decreased urine volume.  Musculoskeletal: Negative for myalgias and back pain.  Skin: Negative for rash and wound.  Neurological: Positive for dizziness. Negative for  weakness, numbness and headaches.  Psychiatric/Behavioral: Negative for confusion. The patient is not nervous/anxious.       Current Outpatient Prescriptions on File Prior to Visit  Medication Sig Dispense Refill  . cholecalciferol (VITAMIN D) 400 units TABS tablet Take 400 Units by mouth daily.    Marland Kitchen diltiazem (CARDIZEM CD) 180 MG 24 hr capsule TAKE ONE CAPSULE EVERY DAY 90 capsule 3  . ESTRACE VAGINAL 0.1 MG/GM vaginal cream INSERT 1/4 OF APPLICATORFUL VAGINALLY TWICE PER WEEK 42.5 g 11  . ferrous sulfate 325 (65 FE) MG EC tablet Take 325 mg by mouth 2 (two) times a week.     . Flaxseed, Linseed, (FLAX SEED OIL) 1000 MG CAPS Take 2 capsules by mouth 2 (two) times daily.     . Magnesium 250 MG TABS Take 1 tablet by mouth at bedtime.    . Multiple Vitamin (MULTIVITAMIN) tablet Take 1 tablet by mouth daily.      . rivaroxaban (XARELTO) 20 MG TABS tablet Take 1 tablet (20 mg total) by mouth daily. 90 tablet 3   No current facility-administered medications on file prior to visit.     Past Medical History  Diagnosis Date  . DEGENERATIVE JOINT DISEASE, RIGHT HIP 02/16/2007  . POSTMENOPAUSAL SYNDROME 02/16/2007  . PREMATURE ATRIAL CONTRACTIONS 02/16/2007  . 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 Venous insufficiency   . Dupuytren's contracture   . Osteoarthritis of left knee   . Osteoarthritis of hip     right  . S/P breast biopsy, left     two o'clock position - benign  . Colles' fracture of right radius 03/05/2015   Allergies  Allergen Reactions  .  Sulfonamide Derivatives     REACTION: HIVES    Social History   Social History  . Marital Status: Divorced    Spouse Name: N/A  . Number of Children: N/A  . Years of Education: N/A   Social History Main Topics  . Smoking status: Former Research scientist (life sciences)  . Smokeless tobacco: None  . Alcohol Use: No  . Drug Use: None  . Sexual Activity: Not Asked   Other Topics Concern  . None   Social History Narrative    Filed Vitals:    08/01/15 1220  BP: 104/64  Pulse: 91  Temp: 98.2 F (36.8 C)  Resp: 12   Body mass index is 19.92 kg/(m^2).  SpO2 Readings from Last 3 Encounters:  08/01/15 98%  07/26/15 96%  04/29/15 98%      Physical Exam  Constitutional: She is oriented to person, place, and time. She appears well-developed and well-nourished. She does not appear ill. No distress.  HENT:  Head: Atraumatic.  Mouth/Throat: Oropharynx is clear and moist and mucous membranes are normal.  Eyes: Conjunctivae are normal.  Cardiovascular: Normal rate.  An irregular rhythm present.  No murmur heard. Pulses:      Dorsalis pedis pulses are 2+ on the right side, and 2+ on the left side.  Respiratory: Effort normal and breath sounds normal. No respiratory distress.  GI: Soft. She exhibits no distension and no mass. Bowel sounds are increased. There is no hepatomegaly. There is no tenderness.  Musculoskeletal: She exhibits no edema.  Neurological: She is alert and oriented to person, place, and time. Coordination normal.  Stable gait without assistance  Skin: Skin is warm. No erythema.  RLE. Distally above lateral malleolus a 3-4 brown crust, no erythema or  local heat,  no local edema or tenderness. Post inflammatory pigmentation changes.  Psychiatric: She has a normal mood and affect.  Well groomed, good eye contact.      ASSESSMENT AND PLAN:     [name] was seen today for diarrhea from doxy.  Diagnoses and all orders for this visit:  Diarrhea, unspecified type  Nausea and vomiting, intractability of vomiting not specified, unspecified vomiting type -     ondansetron (ZOFRAN) 4 MG tablet; Take 1 tablet (4 mg total) by mouth every 8 (eight) hours as needed for nausea or vomiting.   Symptoms certainly can be related to recent antibiotic use, I don't think further lab work is needed at this time. Other possible etiologies were discussed, good hand hygiene. Adequate hydration, small and frequent  sips.  Stop antibiotic. Monitor for new symptoms, clearly instructed about warning signs. OTC probiotic might help. Symptomatic treatment for nausea with Zofran was recommended. Follow-up as needed.        -Stephanie Shaw advised to return or notify a doctor immediately if symptoms worsen or persist or new concerns arise, she voices understanding.       Kloi Brodman G. [name], MD  [facility]. [place] office.

## 2015-08-01 NOTE — Progress Notes (Signed)
Pre visit review using our clinic review tool, if applicable. No additional management support is needed unless otherwise documented below in the visit note. 

## 2015-08-01 NOTE — Telephone Encounter (Signed)
Patient Name: Stephanie Shaw  DOB: [birth year]    Initial Comment caller states she is having reaction to abx   Nurse Assessment  Nurse: Raphael Gibney, RN, Vanita Ingles Date/Time (Eastern Time): 08/01/2015 9:19:51 AM  Confirm and document reason for call. If symptomatic, describe symptoms. You must click the next button to save text entered. ---Caller states she is having reaction to doxycycline. She is having diarrhea, vomiting. She is dizzy. She is lightheaded. No vomiting today. She is nauseated. Started antibiotics last Thursday. She did not take medication last night.  Has the patient traveled out of the country within the last 30 days? ---Not Applicable  Does the patient have any new or worsening symptoms? ---Yes  Will a triage be completed? ---Yes  Related visit to physician within the last 2 weeks? ---No  Does the PT have any chronic conditions? (i.e. diabetes, asthma, etc.) ---Yes  List chronic conditions. ---A fib  Is this a behavioral health or substance abuse call? ---No     Guidelines    Guideline Title Affirmed Question Affirmed Notes  Diarrhea [1] MODERATE diarrhea (e.g., 4-6 times / day more than normal) AND [2] age > 17 years    Final Disposition User   See Physician within 24 Hours [place], RN, Vera    Comments  Appt scheduled for 08/01/2015 at 12:30 pm with Dr. Betty [name]   Referrals  REFERRED TO PCP OFFICE   Disagree/Comply: Comply

## 2015-08-01 NOTE — Telephone Encounter (Signed)
Pt is on schedule to see Dr. Sarajane Jews today around noon.

## 2015-08-01 NOTE — Patient Instructions (Signed)
A few things to remember from today's visit:   Diarrhea, unspecified type  Nausea and vomiting, intractability of vomiting not specified, unspecified vomiting type - Plan: ondansetron (ZOFRAN) 4 MG tablet    . Good hand hygiene is important. Small and frequent sips of clear fluids, Pedialyte or Gatorade age good options.  BRAT diet: bananas, rice, applesauce, and toast.  If persistent vomiting stop solids for 24 hours then resume liquid/soft diet and advance as tolerated.  Probiotic daily (Align cap once daily).  Notified immediately or seek medical attention if you cannot keep fluids down, decreased urine output, severe abdominal pain, red bright blood per rectum, or changes in mental status.  Follow if not full recovery in 2-3 weeks.        Please be sure medication list is accurate. If a new problem present, please set up appointment sooner than planned today.

## 2015-08-20 MED FILL — XARELTO 20 MG TABLET: 20 | 90 days supply | Qty: 90 | Fill #1

## 2015-08-27 ENCOUNTER — Telehealth: Payer: Self-pay | Admitting: Family Medicine

## 2015-08-27 DIAGNOSIS — Z0000 Encounter for general adult medical examination without abnormal findings: Secondary | ICD-10-CM

## 2015-08-27 DIAGNOSIS — Z8639 Personal history of other endocrine, nutritional and metabolic disease: Secondary | ICD-10-CM

## 2015-08-27 NOTE — Telephone Encounter (Signed)
Okay to go ahead and put labs in?

## 2015-08-27 NOTE — Telephone Encounter (Signed)
FLP and BMP can be done before visit, fasting. TSH because Hx of goiter.  She had labs done recently (CMP, CBC), 05/2015.  Thanks, BJ

## 2015-08-27 NOTE — Telephone Encounter (Signed)
Pt has seen Dr [name] in the past for acute visits, and is establishing with her now. (previous [facility] pt)  It is time for her cpe. Pt wants to know if she can have cpx labs done at [facility] prior to her appoitnment 9/14? Please advise  If ok, can you put the order in?

## 2015-08-28 NOTE — Telephone Encounter (Signed)
Labs put in. Called and left a message for patient letting her know that labs were placed for the [facility] and to go fasting.

## 2015-08-29 ENCOUNTER — Other Ambulatory Visit (INDEPENDENT_AMBULATORY_CARE_PROVIDER_SITE_OTHER): Payer: BC Managed Care – PPO

## 2015-08-29 DIAGNOSIS — Z8639 Personal history of other endocrine, nutritional and metabolic disease: Secondary | ICD-10-CM | POA: Diagnosis not present

## 2015-08-29 DIAGNOSIS — Z0000 Encounter for general adult medical examination without abnormal findings: Secondary | ICD-10-CM | POA: Diagnosis not present

## 2015-08-29 LAB — LIPID PANEL
CHOL/HDL RATIO: 2
Cholesterol: 199 mg/dL (ref 0–200)
HDL: 88.2 mg/dL (ref >39.00)
LDL Cholesterol: 103 mg/dL — ABNORMAL HIGH (ref 0–99)
NONHDL: 111.04
Triglycerides: 42 mg/dL (ref 0.0–149.0)
VLDL: 8.4 mg/dL (ref 0.0–40.0)

## 2015-08-29 LAB — BASIC METABOLIC PANEL
BUN: 17 mg/dL (ref 6–23)
CALCIUM: 9.6 mg/dL (ref 8.4–10.5)
CO2: 30 mEq/L (ref 19–32)
Chloride: 105 mEq/L (ref 96–112)
Creatinine, Ser: 0.88 mg/dL (ref 0.40–1.20)
GFR: 66.48 mL/min (ref >60.00)
GLUCOSE: 91 mg/dL (ref 70–99)
Potassium: 4.4 mEq/L (ref 3.5–5.1)
Sodium: 140 mEq/L (ref 135–145)

## 2015-08-29 LAB — TSH: TSH: 1.15 u[IU]/mL (ref 0.35–4.50)

## 2015-09-27 ENCOUNTER — Other Ambulatory Visit: Payer: Self-pay | Admitting: Family Medicine

## 2015-09-27 ENCOUNTER — Other Ambulatory Visit: Payer: Self-pay | Admitting: Physician Assistant

## 2015-09-27 ENCOUNTER — Telehealth: Payer: Self-pay | Admitting: Family Medicine

## 2015-09-27 DIAGNOSIS — R921 Mammographic calcification found on diagnostic imaging of breast: Secondary | ICD-10-CM

## 2015-09-27 NOTE — Telephone Encounter (Signed)
[facility] - Client [place] [facility] Patient Name: Stephanie Shaw DOB: 19-Nov-1940 Initial Comment Caller has red bumps breaking out all over her leg. They look like chigger bites. Nurse Assessment Nurse: Martyn Ehrich, RN, Felicia Date/Time (Eastern Time): 09/27/2015 4:11:26 PM Confirm and document reason for call. If symptomatic, describe symptoms. You must click the next button to save text entered. ---Pt has red bumps on L leg mostly and one on back of R leg. She says they are real itchy - 1st one came on yesterday. Only itchy on legs. No fever. A little larger than pencil point. Not patches. Look like chiggers but she has not been outdoors since Monday Has the patient traveled out of the country within the last 30 days? ---No Does the patient have any new or worsening symptoms? ---Yes Will a triage be completed? ---YesRelated visit to physician within the last 2 weeks? ---No Does the PT have any chronic conditions? (i.e. diabetes, asthma, etc.) ---Yes List chronic conditions. ---a fib - on blood thinner Is this a behavioral health or substance abuse call? ---No Guidelines Guideline Title Affirmed Question Affirmed Notes Rash or Redness - Widespread Mild widespread rash Final Disposition User See PCP When Office is Open (within 3 days) Martyn Ehrich, RN, Felicia Referrals REFERRED TO PCP OFFICEDisagree/Comply: Comply Call Id: [id number]

## 2015-09-28 ENCOUNTER — Ambulatory Visit: Payer: BC Managed Care – PPO | Admitting: Family Medicine

## 2015-09-28 NOTE — Telephone Encounter (Signed)
Noted. Patient is scheduled to see Dr. [name] this am at 9:45.

## 2015-10-04 ENCOUNTER — Ambulatory Visit: Payer: BC Managed Care – PPO | Admitting: Family Medicine

## 2015-10-10 ENCOUNTER — Ambulatory Visit
Admission: RE | Admit: 2015-10-10 | Discharge: 2015-10-10 | Disposition: A | Discharge status: Home / Self Care | Payer: BC Managed Care – PPO | Source: Ambulatory Visit | Attending: Physician Assistant | Admitting: Physician Assistant

## 2015-10-10 DIAGNOSIS — R921 Mammographic calcification found on diagnostic imaging of breast: Secondary | ICD-10-CM

## 2015-10-19 ENCOUNTER — Encounter: Payer: Self-pay | Admitting: Family Medicine

## 2015-10-19 ENCOUNTER — Ambulatory Visit (INDEPENDENT_AMBULATORY_CARE_PROVIDER_SITE_OTHER): Payer: BC Managed Care – PPO | Admitting: Family Medicine

## 2015-10-19 VITALS — BP 94/62 | HR 69 | Temp 98.1°F | Resp 12 | Ht 68.0 in | Wt 136.5 lb

## 2015-10-19 DIAGNOSIS — I482 Chronic atrial fibrillation, unspecified: Secondary | ICD-10-CM

## 2015-10-19 DIAGNOSIS — E042 Nontoxic multinodular goiter: Secondary | ICD-10-CM

## 2015-10-19 DIAGNOSIS — I8393 Asymptomatic varicose veins of bilateral lower extremities: Secondary | ICD-10-CM

## 2015-10-19 DIAGNOSIS — I872 Venous insufficiency (chronic) (peripheral): Secondary | ICD-10-CM | POA: Insufficient documentation

## 2015-10-19 DIAGNOSIS — Z0000 Encounter for general adult medical examination without abnormal findings: Secondary | ICD-10-CM

## 2015-10-19 DIAGNOSIS — R251 Tremor, unspecified: Secondary | ICD-10-CM

## 2015-10-19 DIAGNOSIS — Z0001 Encounter for general adult medical examination with abnormal findings: Secondary | ICD-10-CM

## 2015-10-19 DIAGNOSIS — M8580 Other specified disorders of bone density and structure, unspecified site: Secondary | ICD-10-CM

## 2015-10-19 DIAGNOSIS — N952 Postmenopausal atrophic vaginitis: Secondary | ICD-10-CM

## 2015-10-19 DIAGNOSIS — M859 Disorder of bone density and structure, unspecified: Secondary | ICD-10-CM

## 2015-10-19 HISTORY — DX: Tremor, unspecified: R25.1

## 2015-10-19 HISTORY — DX: Postmenopausal atrophic vaginitis: N95.2

## 2015-10-19 NOTE — Progress Notes (Signed)
Pre visit review using our clinic review tool, if applicable. No additional management support is needed unless otherwise documented below in the visit note. 

## 2015-10-19 NOTE — Patient Instructions (Addendum)
A few things to remember from today's visit:   Routine general medical examination at a health care facility  Chronic atrial fibrillation [facility])  Vaginal atrophy  A few tips:  -As we age balance is not as good as it was, so there is a higher risks for falls. Please remove small rugs and furniture that is "in your way" and could increase the risk of falls. Stretching exercises may help with fall prevention: Yoga and Tai Chi are some examples. Low impact exercise is better, so you are not very achy the next day.  -Sun screen and avoidance of direct sun light recommended. Caution with dehydration, if working outdoors be sure to drink enough fluids.  - Some medications are not safe as we age, increases the risk of side effects and can potentially interact with other medication you are also taken;  including some of over the counter medications. Be sure to let me know when you start a new medication even if it is a dietary/vitamin supplement.   -Healthy diet low in red meet/animal fat and sugar + regular physical activity is recommended.      Please check blood pressure and let me know about reading in 3-4 weeks.   Please be sure medication list is accurate. If a new problem present, please set up appointment sooner than planned today.

## 2015-10-19 NOTE — Progress Notes (Signed)
HPI:   [name] is a 75 y.o. female, who is here today for her routine annual examination. She has labs done last months.  She follows with cardiologist annually, history of atrial fibrillation. Currently she is on Cardizem CD 180 mg daily and Xorelto. Her BP today mildly low, according to pt, this is her "normal" BP. Denies dizziness.  Next cardiologists f/u 05/2016.  She is tolerating medication well, frequent ecchymoses on forearms with minor trauma,stable. Denies gum/nose bleeding.  Denies severe/frequent headache, visual changes, chest pain, dyspnea, palpitation, claudication,or  focal weakness..  She  lives alone. Independent ADL's and IADL's, still works full time, mild Pharmacist, hospital at [employer]. + she has had a couple falls in the past year: 01/2015 and 04/2015. She denies depression symptoms. + Hearing loss, wears hearing aids, not on today.  DEXA done 06/2015 osteopenia, FRAX score for hip fracture 4.7.  She is on Ca++ and vit D supplementation. She exercises regularly, hikes, has slowed down since she had a fall in 04/2015 but planning on going for a hike in a few days. Last time she fell because she did not have her hiking sticks. Still mils limitation of left shoulder ROM and muscle strengthen,proggressively getting better.   Concerns today: tremor, "weakness", LE edema.   Hx of iron def anemia, she is on Fe Sulfate 325 mg 2 times per week. Thyroid nodule, she has not noted growth, dysphagia, or neck pain.   Lab Results  Component Value Date   WBC 6.3 06/14/2015   HGB 13.7 06/14/2015   HCT 40.4 06/14/2015   MCV 93.5 06/14/2015   PLT 191 06/14/2015    Vaginal atrophy: She is currently on Estrace vaginal cream 2-3 times per week. She feels like medications helps with symptoms she was having at the time it was recommended: vaginal irritation and frequent UTI's.  She had her last mammogram 09/2015 [facility] 3. Recommended mammogram in a year. She has not  noted masses, pain, skin changes, or nipple discharged.She has had Bx done and report benign calcifications.   Ankle and feet edema for long time, seems to be more noticeable after left UE fracture. Seems to be worse at the end of the day. Elevation does not seems to help, but adds that she does not do it often. She has compression stockings that help greatly but cannot tolerate them during hot weather. This problems seems to be worse  when she has more standing during the day. Denies associated pain or skin erythema/lesions.    Lab Results  Component Value Date   CREATININE 0.88 08/29/2015   BUN 17 08/29/2015   NA 140 08/29/2015   K 4.4 08/29/2015   CL 105 08/29/2015   CO2 30 08/29/2015   Lab Results  Component Value Date   CHOL 199 08/29/2015   HDL 88.20 08/29/2015   LDLCALC 103 (H) 08/29/2015   LDLDIRECT 104.2 01/25/2013   TRIG 42.0 08/29/2015   CHOLHDL 2 08/29/2015   Lab Results  Component Value Date   TSH 1.15 08/29/2015    Tremor:  She is also c/o mild hand and jaw tremor, which she is not sure for how long she has had it but has noticed it since 04/2015 after humeral fracture. She is not aware of FHx of tremor. Seems to be more noticeable when she tried to type on computer and usually when she has been doing it for hours. Also after she completes her PT exercises. No associated headache, visual  changes, weakness, or gait abnormalities. She denies Hx of alcohol abuse.     Review of Systems  Constitutional: Negative for activity change, appetite change, fatigue, fever and unexpected weight change.  HENT: Positive for hearing loss (Bilateral hearing aid). Negative for dental problem, ear pain, facial swelling, mouth sores, trouble swallowing and voice change.   Eyes: Negative for photophobia, pain and visual disturbance.  Respiratory: Negative for cough, shortness of breath and wheezing.   Cardiovascular: Positive for leg swelling. Negative for chest pain and  palpitations.  Gastrointestinal: Negative for abdominal pain, blood in stool, nausea and vomiting.       No changes in bowel habits.  Endocrine: Negative for cold intolerance, heat intolerance, polydipsia, polyphagia and polyuria.  Genitourinary: Negative for decreased urine volume, difficulty urinating, dysuria, hematuria, vaginal bleeding and vaginal discharge.  Musculoskeletal: Positive for arthralgias. Negative for gait problem, myalgias and neck pain.  Skin: Negative for color change and rash.  Neurological: Positive for tremors. Negative for dizziness, seizures, syncope, weakness, numbness and headaches.  Hematological: Negative for adenopathy. Bruises/bleeds easily.  Psychiatric/Behavioral: Negative for confusion and sleep disturbance. The patient is not nervous/anxious.   All other systems reviewed and are negative.     Current Outpatient Prescriptions on File Prior to Visit  Medication Sig Dispense Refill  . cholecalciferol (VITAMIN D) 400 units TABS tablet Take 400 Units by mouth daily.    Marland Kitchen diltiazem (CARDIZEM CD) 180 MG 24 hr capsule TAKE ONE CAPSULE EVERY DAY 90 capsule 3  . ESTRACE VAGINAL 0.1 MG/GM vaginal cream INSERT 1/4 OF APPLICATORFUL VAGINALLY TWICE PER WEEK 42.5 g 11  . ferrous sulfate 325 (65 FE) MG EC tablet Take 325 mg by mouth 2 (two) times a week.     . Flaxseed, Linseed, (FLAX SEED OIL) 1000 MG CAPS Take 2 capsules by mouth 2 (two) times daily.     . Magnesium 250 MG TABS Take 1 tablet by mouth at bedtime.    . Multiple Vitamin (MULTIVITAMIN) tablet Take 1 tablet by mouth daily.      . rivaroxaban (XARELTO) 20 MG TABS tablet Take 1 tablet (20 mg total) by mouth daily. 90 tablet 3   No current facility-administered medications on file prior to visit.      Past Medical History:  Diagnosis Date  . Colles' fracture of right radius 03/05/2015  . DEGENERATIVE JOINT DISEASE, RIGHT HIP 02/16/2007  . Dupuytren's contracture   . Osteoarthritis of hip    right  .  Osteoarthritis of left knee   . POSTMENOPAUSAL SYNDROME 02/16/2007  . PREMATURE ATRIAL CONTRACTIONS 02/16/2007  . S/P breast biopsy, left    two o'clock position - benign  . 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 Venous insufficiency    Allergies  Allergen Reactions  . Sulfonamide Derivatives     REACTION: HIVES    Social History   Social History  . Marital status: Divorced    Spouse name: N/A  . Number of children: N/A  . Years of education: N/A   Social History Main Topics  . Smoking status: Former Research scientist (life sciences)  . Smokeless tobacco: None  . Alcohol use No  . Drug use: Unknown  . Sexual activity: Not Asked   Other Topics Concern  . None   Social History Narrative  . None    Vitals:   10/19/15 1254  BP: 94/62  Pulse: 69  Resp: 12  Temp: 98.1 F (36.7 C)   O2 sat 98% at RA.  Body mass index  is 20.75 kg/m.    Physical Exam  Nursing note and vitals reviewed. Constitutional: She is oriented to person, place, and time. She appears well-developed and well-nourished. No distress.  HENT:  Head: Atraumatic.  Right Ear: Hearing, tympanic membrane, external ear and ear canal normal.  Left Ear: Hearing, tympanic membrane, external ear and ear canal normal.  Mouth/Throat: Uvula is midline, oropharynx is clear and moist and mucous membranes are normal.  No hearing aids today. Gross hearing at conversation level intact.  Eyes: Conjunctivae and EOM are normal. Pupils are equal, round, and reactive to light.  Neck: No JVD present. No tracheal deviation present. Thyroid mass (R nodule) and thyromegaly present.  Cardiovascular: Normal rate.  An irregular rhythm present.  No murmur heard. Pulses:      Dorsalis pedis pulses are 2+ on the right side, and 2+ on the left side.       Posterior tibial pulses are 2+ on the right side, and 2+ on the left side.  Varicose veins bilateral LE  Respiratory: Effort normal and breath sounds normal. No respiratory distress.  GI: Soft. She  exhibits no mass. There is no hepatomegaly. There is no tenderness.  Musculoskeletal: She exhibits edema (Trace pitting edema LE, peri ankle bilateral ). She exhibits no tenderness.  No major deformity or sing of synovitis appreciated.  Lymphadenopathy:    She has no cervical adenopathy.       Right: No supraclavicular adenopathy present.       Left: No supraclavicular adenopathy present.  Neurological: She is alert and oriented to person, place, and time. She has normal strength. She displays tremor (fine tremor noted on hands, R>L, with intention. Facial or head tremor not appreciated.). No cranial nerve deficit or sensory deficit (grossly intact). She displays a negative Romberg sign. Coordination and gait normal.  Reflex Scores:      Bicep reflexes are 2+ on the right side and 2+ on the left side.      Patellar reflexes are 2+ on the right side and 2+ on the left side. Pronator drift negative. Stable gait with no assistance.  Skin: Skin is warm. No erythema.  Pretibial hyperpigmentation changes  Psychiatric: She has a normal mood and affect. Her speech is normal. Cognition and memory are normal.  Well groomed, good eye contact.      ASSESSMENT AND PLAN:     Kyan was seen today for transfer.  Diagnoses and all orders for this visit:    Routine general medical examination at a health care facility   We discussed the importance of regular physical activity and healthy diet for prevention of chronic illness and/or complications. Preventive guidelines reviewed. Cognitive function grossly intact base on observation during OV. Fall precautions. Vaccination up to date. Ca++ and vit D supplementation to continue. Next CPE in 1 year.    Chronic atrial fibrillation (HCC)  Rate adequately controlled, HR 83/min. Asymptomatic. For now we will not change and also Cardizem, I instructed her to check BP at home 3-4 times per week and readings BP through my chart in 2-3 weeks. If  BP still low I will consider decreasing dose of Cardizem. Clearly instructed about warning signs.   Vaginal atrophy  Well controlled. No changes in current management. We dicussed side effects of hormonal therapy. F/U in 12 months.  Nontoxic multinodular goiter  Stable and asymptomatic. Will continue monitoring. F/U in 12 months.  Tremor, unspecified  Possible etiologies discussed as well as treatment options. Hx suggest essential tremor,  mild. Will continue monitoring.  Low bone density for age  We discussed DEXA results and FRAX score. She is not interested in pharmacologic treatment. We discussed fall precautions in detail. Continue PT and regular exercise, hiking can increase risk of falls. Adequate Ca++ and Vit D supplementation.   Varicose veins of both lower extremities  LE edema seems to be venous related. Mild. Compression stockings or LE elevation a few times in the afternoon and above heart level. Skin care. I do not think diuretic treatment is necessary at this time. F/U as needed.          -Ms. Stephanie Shaw was advised to return sooner than planned today if new concerns arise.       [name]. [name], MD  [facility]. [place] office.

## 2015-11-16 ENCOUNTER — Encounter: Payer: Self-pay | Admitting: Family Medicine

## 2015-11-16 MED FILL — XARELTO 20 MG TABLET: 20 | 30 days supply | Qty: 30 | Fill #2

## 2015-11-19 ENCOUNTER — Other Ambulatory Visit: Payer: Self-pay | Admitting: Family Medicine

## 2015-11-19 DIAGNOSIS — L819 Disorder of pigmentation, unspecified: Secondary | ICD-10-CM

## 2015-11-19 MED ORDER — DILTIAZEM HCL ER COATED BEADS 120 MG PO CP24: 120.0000 mg | ORAL_CAPSULE | Freq: Every day | ORAL | 1 refills | Status: DC

## 2015-12-18 MED FILL — XARELTO 20 MG TABLET: 20 | 90 days supply | Qty: 90 | Fill #3

## 2016-01-03 ENCOUNTER — Ambulatory Visit (INDEPENDENT_AMBULATORY_CARE_PROVIDER_SITE_OTHER)
Admission: RE | Admit: 2016-01-03 | Discharge: 2016-01-03 | Disposition: A | Discharge status: Home / Self Care | Payer: BC Managed Care – PPO | Source: Ambulatory Visit | Attending: Internal Medicine | Admitting: Internal Medicine

## 2016-01-03 ENCOUNTER — Encounter: Payer: Self-pay | Admitting: Internal Medicine

## 2016-01-03 ENCOUNTER — Ambulatory Visit (INDEPENDENT_AMBULATORY_CARE_PROVIDER_SITE_OTHER): Payer: BC Managed Care – PPO | Admitting: Internal Medicine

## 2016-01-03 VITALS — BP 120/74 | HR 100 | Temp 98.5°F | Resp 20 | Wt 134.0 lb

## 2016-01-03 DIAGNOSIS — J069 Acute upper respiratory infection, unspecified: Secondary | ICD-10-CM | POA: Diagnosis not present

## 2016-01-03 DIAGNOSIS — M79642 Pain in left hand: Secondary | ICD-10-CM | POA: Diagnosis not present

## 2016-01-03 DIAGNOSIS — M25532 Pain in left wrist: Secondary | ICD-10-CM | POA: Insufficient documentation

## 2016-01-03 MED ORDER — AMOXICILLIN 500 MG PO CAPS: 1000.0000 mg | ORAL_CAPSULE | Freq: Two times a day (BID) | ORAL | 0 refills | Status: DC

## 2016-01-03 MED ORDER — HYDROCODONE-HOMATROPINE 5-1.5 MG/5ML PO SYRP: 5.0000 mL | ORAL_SOLUTION | Freq: Four times a day (QID) | ORAL | 0 refills | Status: AC | PRN

## 2016-01-03 NOTE — Patient Instructions (Signed)
Please take all new medication as prescribed - the antibiotic, and cough medicine if needed   Please continue all other medications as before, and refills have been done if requested.  Please have the pharmacy call with any other refills you may need.  Please keep your appointments with your specialists as you may have planned  Please go to the XRAY Department in the Basement (go straight as you get off the elevator) for the x-ray testing  You will be contacted by phone if any changes need to be made immediately.  Otherwise, you will receive a letter about your results with an explanation, but please check with MyChart first.  Please remember to sign up for MyChart if you have not done so, as this will be important to you in the future with finding out test results, communicating by private email, and scheduling acute appointments online when needed.    

## 2016-01-03 NOTE — Progress Notes (Signed)
Subjective:    Patient ID: Stephanie Shaw, female    DOB: March 15, 1940, 75 y.o.   MRN: [id number]  HPI   Here with 2-3 days acute onset fever, facial pain, pressure, headache, general weakness and malaise, and greenish d/c, with mild ST and cough, but pt denies chest pain, wheezing, increased sob or doe, orthopnea, PND, increased LE swelling, palpitations, dizziness or syncope.  Also with a recent fall x 2 days ago, tripped, fell to left side and hit left wrist and hand on furniture, did not actually fall to floor but hit hard.Now with large bruise to the wrist with pain and mild swelling, but left hand with 5th ray pain, swelling mostly to mid aspect but without obvious deformity Past Medical History:  Diagnosis Date  . Colles' fracture of right radius 03/05/2015  . DEGENERATIVE JOINT DISEASE, RIGHT HIP 02/16/2007  . Dupuytren's contracture   . Osteoarthritis of hip    right  . Osteoarthritis of left knee   . POSTMENOPAUSAL SYNDROME 02/16/2007  . PREMATURE ATRIAL CONTRACTIONS 02/16/2007  . S/P breast biopsy, left    two o'clock position - benign  . 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 Venous insufficiency    Past Surgical History:  Procedure Laterality Date  . BREAST BIOPSY Left     reports that she has quit smoking. She does not have any smokeless tobacco history on file. She reports that she does not drink alcohol. Her drug history is not on file. family history includes Alcohol abuse in her father; Cancer in her father and mother; Diabetes in her mother; Heart failure in her mother; Osteoarthritis in her sister. Allergies  Allergen Reactions  . Sulfonamide Derivatives     REACTION: HIVES   Current Outpatient Prescriptions on File Prior to Visit  Medication Sig Dispense Refill  . cholecalciferol (VITAMIN D) 400 units TABS tablet Take 400 Units by mouth daily.    Marland Kitchen diltiazem (CARDIZEM CD) 120 MG 24 hr capsule Take 1 capsule (120 mg total) by mouth daily. 30 capsule 1  . ESTRACE  VAGINAL 0.1 MG/GM vaginal cream INSERT 1/4 OF APPLICATORFUL VAGINALLY TWICE PER WEEK 42.5 g 11  . ferrous sulfate 325 (65 FE) MG EC tablet Take 325 mg by mouth 2 (two) times a week.     . Flaxseed, Linseed, (FLAX SEED OIL) 1000 MG CAPS Take 2 capsules by mouth 2 (two) times daily.     . Magnesium 250 MG TABS Take 1 tablet by mouth at bedtime.    . Multiple Vitamin (MULTIVITAMIN) tablet Take 1 tablet by mouth daily.      . rivaroxaban (XARELTO) 20 MG TABS tablet Take 1 tablet (20 mg total) by mouth daily. 90 tablet 3   No current facility-administered medications on file prior to visit.    Review of Systems  Constitutional: Negative for unusual diaphoresis or night sweats HENT: Negative for ear swelling or discharge Eyes: Negative for worsening visual haziness  Respiratory: Negative for choking and stridor.   Gastrointestinal: Negative for distension or worsening eructation Genitourinary: Negative for retention or change in urine volume.  Musculoskeletal: Negative for other MSK pain or swelling Skin: Negative for color change and worsening wound Neurological: Negative for tremors and numbness other than noted  Psychiatric/Behavioral: Negative for decreased concentration or agitation other than above   All other system neg per pt    Objective:   Physical Exam BP 120/74   Pulse 100   Temp 98.5 F (36.9 C) (Oral)  Resp 20   Wt 134 lb (60.8 kg)   SpO2 98%   BMI 20.37 kg/m  VS noted,  Constitutional: Pt appears in no apparent distress HENT: Head: NCAT.  Right Ear: External ear normal.  Left Ear: External ear normal.  Eyes: . Pupils are equal, round, and reactive to light. Conjunctivae and EOM are normal Bilat tm's with mild erythema.  Max sinus areas mild tender.  Pharynx with mild erythema, no exudate Neck: Normal range of motion. Neck supple.  Cardiovascular: Normal rate and regular rhythm.   Pulmonary/Chest: Effort normal and breath sounds without rales or wheezing.  Left  wrist with 3 x 4 cm bruising mostly volar surface, also left hand with 5th ray mid aspect swelling and tenderness Neurological: Pt is alert. Not confused , motor grossly intact Skin: Skin is warm. No rash, no LE edema Psychiatric: Pt behavior is normal. No agitation.  No other new exam findings    Assessment & Plan:

## 2016-01-03 NOTE — Progress Notes (Signed)
Pre visit review using our clinic review tool, if applicable. No additional management support is needed unless otherwise documented below in the visit note. 

## 2016-01-05 NOTE — Assessment & Plan Note (Signed)
Mild to mod, for antibx course,  to f/u any worsening symptoms or concerns 

## 2016-01-05 NOTE — Assessment & Plan Note (Signed)
With large bruising, cant r/o fx, for film, pain control,  to f/u any worsening symptoms or concerns

## 2016-01-05 NOTE — Assessment & Plan Note (Signed)
Cant r/o fx, for film, pain control,  to f/u any worsening symptoms or concerns

## 2016-03-17 MED FILL — XARELTO 20 MG TABLET: 20 | 90 days supply | Qty: 90 | Fill #4

## 2016-04-03 ENCOUNTER — Other Ambulatory Visit: Payer: Self-pay | Admitting: Family Medicine

## 2016-05-20 ENCOUNTER — Other Ambulatory Visit: Payer: Self-pay

## 2016-05-20 MED ORDER — ESTRADIOL 0.1 MG/GM VA CREA: TOPICAL_CREAM | VAGINAL | 3 refills | Status: DC

## 2016-05-20 MED FILL — ESTRADIOL 0.1 MG/GM CRM: 0.1 | 90 days supply | Qty: 43 | Fill #0

## 2016-05-29 ENCOUNTER — Ambulatory Visit (INDEPENDENT_AMBULATORY_CARE_PROVIDER_SITE_OTHER): Payer: BC Managed Care – PPO | Admitting: Cardiovascular Disease

## 2016-05-29 ENCOUNTER — Encounter: Payer: Self-pay | Admitting: Cardiovascular Disease

## 2016-05-29 VITALS — BP 112/64 | HR 90 | Ht 68.0 in | Wt 135.0 lb

## 2016-05-29 DIAGNOSIS — Z7901 Long term (current) use of anticoagulants: Secondary | ICD-10-CM

## 2016-05-29 DIAGNOSIS — I481 Persistent atrial fibrillation: Secondary | ICD-10-CM

## 2016-05-29 DIAGNOSIS — I4819 Other persistent atrial fibrillation: Secondary | ICD-10-CM

## 2016-05-29 HISTORY — DX: Long term (current) use of anticoagulants: Z79.01

## 2016-05-29 LAB — BASIC METABOLIC PANEL
BUN / CREAT RATIO: 26 (ref 12–28)
BUN: 22 mg/dL (ref 8–27)
CO2: 23 mmol/L (ref 18–29)
CREATININE: 0.84 mg/dL (ref 0.57–1.00)
Calcium: 9.6 mg/dL (ref 8.7–10.3)
Chloride: 100 mmol/L (ref 96–106)
GFR calc Af Amer: 78 mL/min/{1.73_m2} (ref >59)
GFR calc non Af Amer: 68 mL/min/{1.73_m2} (ref >59)
GLUCOSE: 85 mg/dL (ref 65–99)
Potassium: 5 mmol/L (ref 3.5–5.2)
Sodium: 139 mmol/L (ref 134–144)

## 2016-05-29 LAB — CBC
HEMOGLOBIN: 13.5 g/dL (ref 11.1–15.9)
Hematocrit: 40.4 % (ref 34.0–46.6)
MCH: 31 pg (ref 26.6–33.0)
MCHC: 33.4 g/dL (ref 31.5–35.7)
MCV: 93 fL (ref 79–97)
PLATELETS: 173 10*3/uL (ref 150–379)
RBC: 4.36 x10E6/uL (ref 3.77–5.28)
RDW: 13.5 % (ref 12.3–15.4)
WBC: 4.7 10*3/uL (ref 3.4–10.8)

## 2016-05-29 NOTE — Patient Instructions (Signed)
Medication Instructions:  Your physician recommends that you continue on your current medications as directed. Please refer to the Current Medication list given to you today.   Labwork: TODAY - CBC, BMET   Testing/Procedures: None Ordered   Follow-Up: Your physician wants you to follow-up in: 1 year with Dr. [name].  You will receive a reminder letter in the mail two months in advance. If you don't receive a letter, please call our office to schedule the follow-up appointment.   If you need a refill on your cardiac medications before your next appointment, please call your pharmacy.   Thank you for choosing [facility]! Keiara Sneeringer, RN [phone]    

## 2016-05-29 NOTE — Progress Notes (Signed)
CARDIOLOGY OFFICE NOTE  Date:  05/29/2016    [name] Date of Birth: Sep 20, 1940 Medical Record #[id number]  PCP:  [name], [name], MD  Cardiologist:  Aundra Dubin  -- now [place]   Chief Complaint  Patient presents with  . Atrial Fibrillation   Problem List 1. Atrial fib 2. Essential tremor    Previous notes from [facility]:   Stephanie Shaw is a 76 y.o. female who presents today for a one year check.     She has a history of chronic atrial fibrillation. Patient has had prior history of PACs, but atrial fibrillation was noted for the first time on 4/12. She seems to have remained in atrial fibrillation since that time.  She is now taking diltiazem CD and Xarelto. She teaches at [employer].  Seen a year ago and was felt to be doing ok.  Comes back today. Here alone. She continues to do ok. She has no real awareness of her atrial fib. She has had 2 falls earlier this year - tripped both time - broke an arm each time. Will be getting bone scan set up. Just now really getting back to her baseline. She says she clearly tripped. No passing out. No chest pain. Not short of breath. Needs her Xarelto refilled.   May 29, 2016:  Seen for the first time today  Transfer from [place]. Hx of persistent atrial fib.   Asymptomatic  Still eats salt  Has had some leg swelling    PMH:  1. Atrial fibrillation: First noted on 05/13/10. Had PACs noted prior to this. Persistent. Echo (5/12): EF 55%, normal wall thickness, normal valves, moderate biatrial enlargement, normal RV.  2. Duputren's contractures 3. Osteoarthritis 4. Venous insufficiency 5. Multinodular goiter: Nontoxic.   Past Medical History:  Diagnosis Date  . Colles' fracture of right radius 03/05/2015  . DEGENERATIVE JOINT DISEASE, RIGHT HIP 02/16/2007  . Dupuytren's contracture   . Osteoarthritis of hip    right  . Osteoarthritis of left knee   . POSTMENOPAUSAL SYNDROME 02/16/2007  . PREMATURE ATRIAL CONTRACTIONS  02/16/2007  . S/P breast biopsy, left    two o'clock position - benign  . 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 Venous insufficiency     Past Surgical History:  Procedure Laterality Date  . BREAST BIOPSY Left      Medications: Current Outpatient Prescriptions  Medication Sig Dispense Refill  . amoxicillin (AMOXIL) 500 MG capsule Take 2 capsules (1,000 mg total) by mouth 2 (two) times daily. 40 capsule 0  . cholecalciferol (VITAMIN D) 400 units TABS tablet Take 400 Units by mouth daily.    Marland Kitchen diltiazem (CARDIZEM CD) 120 MG 24 hr capsule Take 1 capsule (120 mg total) by mouth daily. 30 capsule 1  . diltiazem (CARDIZEM CD) 180 MG 24 hr capsule TAKE ONE CAPSULE EVERY DAY 90 capsule 3  . estradiol (ESTRACE VAGINAL) 0.1 MG/GM vaginal cream INSERT 1/4 OF APPLICATORFUL VAGINALLY TWICE PER WEEK 42.5 g 3  . ferrous sulfate 325 (65 FE) MG EC tablet Take 325 mg by mouth 2 (two) times a week.     . Flaxseed, Linseed, (FLAX SEED OIL) 1000 MG CAPS Take 2 capsules by mouth 2 (two) times daily.     . Magnesium 250 MG TABS Take 1 tablet by mouth at bedtime.    . Multiple Vitamin (MULTIVITAMIN) tablet Take 1 tablet by mouth daily.      . rivaroxaban (XARELTO) 20 MG TABS tablet Take  1 tablet (20 mg total) by mouth daily. 90 tablet 3   No current facility-administered medications for this visit.     Allergies: Allergies  Allergen Reactions  . Sulfonamide Derivatives     REACTION: HIVES    Social History: The patient  reports that she has quit smoking. She has never used smokeless tobacco. She reports that she does not drink alcohol.   Family History: The patient's family history includes Alcohol abuse in her father; Cancer in her father and mother; Diabetes in her mother; Heart failure in her mother; Osteoarthritis in her sister.   Review of Systems: Please see the history of present illness.   Otherwise, the review of systems is positive for none.   All other systems are reviewed and  negative.   Physical Exam: VS:  BP 112/64   Pulse 90   Ht 5\' 8"  (1.727 m)   Wt 135 lb (61.2 kg)   SpO2 97%   BMI 20.53 kg/m  .  BMI Body mass index is 20.53 kg/m.  Wt Readings from Last 3 Encounters:  05/29/16 135 lb (61.2 kg)  01/03/16 134 lb (60.8 kg)  10/19/15 136 lb 8 oz (61.9 kg)    General: pleasant, NAD   HEENT:  Neck is supple.  Normal  Neck: supple  Cardiac: irreg. irreg.  , trace leg edema   Respiratory:   Lungs are clear   GI: good BS .   MS:  Trace leg edema with associated chronic stasis changes.  Skin: warm and dry .  Neuro:   nonfocal   Psych: Alert, appropriate and with normal affect.   LABORATORY DATA:  EKG:  EKG is ordered today. Atrial fib with HR of 90.   Lab Results  Component Value Date   WBC 6.3 06/14/2015   HGB 13.7 06/14/2015   HCT 40.4 06/14/2015   PLT 191 06/14/2015   GLUCOSE 91 08/29/2015   CHOL 199 08/29/2015   TRIG 42.0 08/29/2015   HDL 88.20 08/29/2015   LDLDIRECT 104.2 01/25/2013   LDLCALC 103 (H) 08/29/2015   ALT 16 03/09/2014   AST 23 03/09/2014   NA 140 08/29/2015   K 4.4 08/29/2015   CL 105 08/29/2015   CREATININE 0.88 08/29/2015   BUN 17 08/29/2015   CO2 30 08/29/2015   TSH 1.15 08/29/2015    BNP (last 3 results) No results for input(s): BNP in the last 8760 hours.  ProBNP (last 3 results) No results for input(s): PROBNP in the last 8760 hours.    Assessment/Plan:  Atrial fibrillation  persistent atrial fibrillation. CHADSVASC score is 30 (female gender, age > 105 ).  2. Leg edema :   Still eats salt.  Advised her to cut back    Current medicines are reviewed with the patient today.  The patient does not have concerns regarding medicines other than what has been noted above.  The following changes have been made:  See above.  Labs/ tests ordered today include:   No orders of the defined types were placed in this encounter.    Mertie Moores, MD  05/29/2016 10:42 AM    [facility]  [facility],  [place], [place]  [address] Pager [phone] Phone: [phone]; Fax: [phone]

## 2016-06-18 ENCOUNTER — Other Ambulatory Visit: Payer: Self-pay | Admitting: Nurse Practitioner

## 2016-06-18 MED FILL — XARELTO 20 MG TABLET: 20 | 30 days supply | Qty: 30 | Fill #5

## 2016-06-18 NOTE — Telephone Encounter (Signed)
Request received for Xarelto 20mg ; pt is 76 yrs old, wt-61.2kg, crea-0.84 on 05/29/16, CrCl-64.47ml/min, and last seen by Dr. Acie Fredrickson on 05/29/16. Will send in refill to requested pharmacy.

## 2016-07-17 MED FILL — XARELTO 20 MG TABLET: 20 | 90 days supply | Qty: 90 | Fill #0

## 2016-07-30 ENCOUNTER — Telehealth: Payer: Self-pay | Admitting: Family Medicine

## 2016-07-30 NOTE — Telephone Encounter (Signed)
Pts daughter would like to speak with Dr. [name] in reference to pts health.

## 2016-07-31 NOTE — Telephone Encounter (Signed)
° ° °  Daughter called today to follow on wanting Dr [name] to contact her about her Mom overall health

## 2016-08-01 NOTE — Telephone Encounter (Signed)
Can we get an appointment set up with the daughter? The patient does not have to be present.

## 2016-08-01 NOTE — Telephone Encounter (Signed)
Daughter does not live in [place], please contact when you have a moment.

## 2016-08-01 NOTE — Telephone Encounter (Signed)
We can arrange a meeting with daughter here in the office, 30 min visit, pt does not have to come but we need appropriate authorization to disclose information/Dx. Thanks, BJ

## 2016-08-01 NOTE — Telephone Encounter (Signed)
Please contact daughter to find about about her specific concerns.  Explained that it is hard for me to call in between patients, I am usually running behind most of the time.  Depending of her concerns and if necessary we can have a phone conference with pt here in office and daughter on the phone, which I have done before with some of my patients.  Thanks, BJ

## 2016-08-01 NOTE — Telephone Encounter (Signed)
Daughter is concerned because patient has had a couple falls the winter before last. She said that her mom has had some drastic health changes, and she feels like her mom is not trying hard enough to understand what's going on. She is concerned that she may have depression, wondering about side effects of medications she has taken, and patient mentioned having vertigo. She is also concerned about her not having her full strength back.   Daughter is perfectly fine with having patient come in to see [place] & being called. I advised her I'd send this to you for review and on Monday we can contact patient to choose a date & time for her to come in.  Thanks!

## 2016-08-05 NOTE — Telephone Encounter (Signed)
Pt has been scheduled.  °

## 2016-08-05 NOTE — Telephone Encounter (Signed)
Called pt and pt hung up on me so I called the daughter (charlotte) lmom for her to call the office.

## 2016-08-05 NOTE — Telephone Encounter (Signed)
I have not seen Stephanie Shaw since 09/2015, at that time she stated that she was still working full time and being independent. She reported a couple falls within the past year. She has followed with cardiologists since last time I saw her. Can we arrange PT for frequent falls before visit?  I would like to see her this week.  Thanks, BJ

## 2016-08-05 NOTE — Telephone Encounter (Signed)
Can we try to get her in for a 30 minute follow up this week?  Thank you!

## 2016-08-18 NOTE — Progress Notes (Signed)
HPI:   Stephanie Shaw is a 76 y.o. female, who is here today to follow on some concerns her daughter has reported.  I saw her last on 10/19/15 for her CPE. Since then she has followed with cardiologists, Dr Acie Fredrickson, 05/29/16.  Recently her daughter called on 07/30/16 expressing concerns about Ms Stephanie Shaw functional decline and frequent falls.  Call on 08/01/16 daughter was concerned about frequent falls, health deterioration, possible depression, side effects from medications, "vertigo",and fatigue.   She lives alone. She works full time as Pharmacist, hospital at [employer], today is her last day of work, she is retiring. She is not very happy about retiring, she loves her job and "worried" about her future and now her health. She states that she had difficulty keeping up with her job for the past "few months" because fatigue, balance issues, left hand/leg "not cooperating", and speech difficulties.  She denies depressed mood, suicidal thoughts,or anxiety.  She is still driving, has not had trouble finding her way around town,retaining new information,or with memory in general.  When I saw her last, she was concerned about mild hand and jaw tremor , noted after humeral fracture in 04/2015. More noticeable after hours of typing. She has noted that tremor is gradually getting worse since late last year.  In general her sleep is unchanged, states that it "varies" , sometimes she has a "hard time" falling asleep.  She has Hx of atrial fib,OA,multinodular goiter.  Lab Results  Component Value Date   WBC 4.7 05/29/2016   HGB 13.5 05/29/2016   HCT 40.4 05/29/2016   MCV 93 05/29/2016   PLT 173 05/29/2016   Lab Results  Component Value Date   TSH 1.15 08/29/2015   Lab Results  Component Value Date   CREATININE 0.84 05/29/2016   BUN 22 05/29/2016   NA 139 05/29/2016   K 5.0 05/29/2016   CL 100 05/29/2016   CO2 23 05/29/2016   She mentions that she has had some exertional dyspnea when going up  stairs, this has been going on for a few months. Denies associated chest pain, wheezing,cough,diaphoresis, palpitation, or syncope. According to pt, her atrial fib is adequately controlled and her dyspnea does not seem cardiac related.  She has had a couple falls since 01/2016, last fall a few weeks ago, unharmed.  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 This happens intermittently. Also "issues with swallowing", "very seldom", she has difficulty describing sensation but denies dysphagia or chocking sensation. Denies headache, numbness,tingling,or MS changes.  Former smoker.  She is on Xarelto,vaginal Estradiol,Diltiazem,and Fe sulfate.    Review of Systems  Constitutional: Positive for fatigue. Negative for appetite change, diaphoresis and fever.  HENT: Positive for trouble swallowing and voice change. Negative for drooling, facial swelling, mouth sores, nosebleeds, postnasal drip and sore throat.   Eyes: Negative for redness and visual disturbance.  Respiratory: Negative for cough, shortness of breath and wheezing.   Cardiovascular: Negative for chest pain, palpitations and leg swelling.  Gastrointestinal: Negative for abdominal pain, nausea and vomiting.       Negative for changes in bowel habits.  Endocrine: Negative for cold intolerance, heat intolerance, polydipsia, polyphagia and polyuria.  Genitourinary: Negative for decreased urine volume, dysuria and hematuria.  Musculoskeletal: Positive for gait problem. Negative for back pain and myalgias.  Skin: Negative for pallor and rash.  Neurological: Positive for tremors and speech difficulty. Negative for seizures, syncope, weakness, numbness and  headaches.  Hematological: Negative for adenopathy. Does not bruise/bleed easily.  Psychiatric/Behavioral: Positive for sleep disturbance. Negative for confusion, hallucinations and suicidal ideas. The patient is  nervous/anxious.       Current Outpatient Prescriptions on File Prior to Visit  Medication Sig Dispense Refill  . cholecalciferol (VITAMIN D) 400 units TABS tablet Take 400 Units by mouth daily.    Marland Kitchen diltiazem (CARDIZEM CD) 180 MG 24 hr capsule TAKE ONE CAPSULE EVERY DAY 90 capsule 3  . estradiol (ESTRACE VAGINAL) 0.1 MG/GM vaginal cream INSERT 1/4 OF APPLICATORFUL VAGINALLY TWICE PER WEEK 42.5 g 3  . ferrous sulfate 325 (65 FE) MG EC tablet Take 325 mg by mouth 2 (two) times a week.     . Flaxseed, Linseed, (FLAX SEED OIL) 1000 MG CAPS Take 2 capsules by mouth 2 (two) times daily.     . Magnesium 250 MG TABS Take 1 tablet by mouth at bedtime.    . Multiple Vitamin (MULTIVITAMIN) tablet Take 1 tablet by mouth daily.      Alveda Reasons 20 MG TABS tablet TAKE 1 TABLET BY MOUTH ONCE DAILY 90 tablet 3   No current facility-administered medications on file prior to visit.      Past Medical History:  Diagnosis Date  . Colles' fracture of right radius 03/05/2015  . DEGENERATIVE JOINT DISEASE, RIGHT HIP 02/16/2007  . Dupuytren's contracture   . Osteoarthritis of hip    right  . Osteoarthritis of left knee   . POSTMENOPAUSAL SYNDROME 02/16/2007  . PREMATURE ATRIAL CONTRACTIONS 02/16/2007  . S/P breast biopsy, left    two o'clock position - benign  . 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 Venous insufficiency    Allergies  Allergen Reactions  . Sulfonamide Derivatives     REACTION: HIVES   Family History  Problem Relation Age of Onset  . Cancer Father   . Alcohol abuse Father   . Cancer Mother   . Diabetes Mother   . Heart failure Mother   . Osteoarthritis Sister     Social History   Social History  . Marital status: Divorced    Spouse name: N/A  . Number of children: N/A  . Years of education: N/A   Social History Main Topics  . Smoking status: Former Research scientist (life sciences)  . Smokeless tobacco: Never Used  . Alcohol use No  . Drug use: Unknown  . Sexual activity: Not Asked   Other  Topics Concern  . None   Social History Narrative  . None    Vitals:   08/19/16 0858  BP: 116/80  Pulse: 92  Resp: 12  O2 sat at RA 98% Body mass index is 20.68 kg/m.  Wt Readings from Last 3 Encounters:  08/19/16 136 lb (61.7 kg)  05/29/16 135 lb (61.2 kg)  01/03/16 134 lb (60.8 kg)     Physical Exam  Nursing note and vitals reviewed. Constitutional: She is oriented to person, place, and time. She appears well-developed and well-nourished. No distress.  HENT:  Head: Atraumatic.  Mouth/Throat: Oropharynx is clear and moist and mucous membranes are normal.  Eyes: Pupils are equal, round, and reactive to light. Conjunctivae and EOM are normal.  Neck: No tracheal deviation present. No thyroid mass and no thyromegaly present.  Cardiovascular: Normal rate.  An irregular rhythm present.  No murmur heard. Pulses:      Dorsalis pedis pulses are 2+ on the right side, and 2+ on the left side.  Respiratory: Effort normal. No respiratory distress.  She has decreased breath sounds in the right lower field. She has no wheezes. She has no rales.  GI: Soft. She exhibits no mass. There is no hepatomegaly. There is no tenderness.  Musculoskeletal: She exhibits no edema.  Lymphadenopathy:    She has no cervical adenopathy.  Neurological: She is alert and oriented to person, place, and time. She has normal strength. She displays tremor (Mild head and hand tremors with intension.). No cranial nerve deficit. Coordination (Left UE: Finger- nose test) abnormal.  Reflex Scores:      Bicep reflexes are 2+ on the right side and 2+ on the left side.      Patellar reflexes are 2+ on the right side and 2+ on the left side. Gait otherwise stable, not assisted today.   Skin: Skin is warm. No rash noted. No erythema.  Psychiatric: She has a normal mood and affect. Cognition and memory are normal.  Well groomed, good eye contact.    ASSESSMENT AND PLAN:   Diagnoses and all orders for this  visit:  Lab Results  Component Value Date   WBC 6.4 08/19/2016   HGB 13.5 08/19/2016   HCT 41.3 08/19/2016   MCV 95.4 08/19/2016   PLT 182.0 08/19/2016   Lab Results  Component Value Date   TSH 1.00 08/19/2016     Chemistry      Component Value Date/Time   NA 138 08/19/2016 1029   NA 139 05/29/2016 1043   K 4.3 08/19/2016 1029   CL 101 08/19/2016 1029   CO2 30 08/19/2016 1029   BUN 14 08/19/2016 1029   BUN 22 05/29/2016 1043   CREATININE 0.79 08/19/2016 1029   CREATININE 0.92 06/14/2015 0853      Component Value Date/Time   CALCIUM 9.6 08/19/2016 1029   ALKPHOS 46 08/19/2016 1029   AST 21 08/19/2016 1029   ALT 15 08/19/2016 1029   BILITOT 0.6 08/19/2016 1029      Fatigue, unspecified type  Possible etiologies dicussed: systemic illness, psychiatric,insomnia,metabolic disorder among some. Further recommendations will be given according to lab results.  -     CBC with Differential/Platelet -     Comprehensive metabolic panel -     TSH  Tremor, unspecified  On exam today tremor is not appreciated at rest, so it could still be essential tremor. Because associated coordination issues, speech and swallowing unspecific difficulties Parkinson is a concern. Recommend neuro evaluation. She does not have acute symptoms, no headache or memory difficulties,so I will hold on brain imaging and let neuro to decide about further work-up. Instructed about warning signs.   -     Ambulatory referral to Neurology -     CBC with Differential/Platelet -     Comprehensive metabolic panel -     TSH -     Vitamin B12 -     RPR  Frequent falls  She is not interested in PT. She states that she did a lot PT after humeral fracture. Explained that it would be a different PT plan. Fall prevention discussed.  -     CBC with Differential/Platelet -     Comprehensive metabolic panel  Abnormal lung sounds  Hypoventilation right base. No acute respiratory symptoms. Exertional  dyspnea, when climbing stairs, has been going on for a few months. Wt has been stable. Former smoker. CXR order place.  -     DG Chest 2 View; Future   Abnormal CXR  CXR report: Right middle lobe atelectasis or early  pneumonia. Underlying COPD. Course of abx recommended, she doe snot want Doxycycline because GI symptoms in the past. So Augmentin recommended. CXR in 3-4 weeks.   -     amoxicillin-clavulanate (AUGMENTIN) 875-125 MG tablet; Take 1 tablet by mouth 2 (two) times daily.   40 min face to face OV. > 50% was dedicated to discussion of differential Dx, prognosis, and some side effects of medications as well as coordination of care.  We were planning on calling daughter at the beginning of her visit , Ms Heaphy forgot phone in her car,so we did contact daughter after plan was discussed. I did summarized visit for daughter. She is very concerned about "ruling out" stroke, we discussed acute CVA symptoms and management.  After a long discussion with Ms Sonier and her daughter, questions answer and they both agree with plan.   -Ms. [name] was advised to return sooner than planned today if new concerns arise.       Garrison Michie G. [name], MD  [facility]. [place] office.

## 2016-08-19 ENCOUNTER — Encounter: Payer: Self-pay | Admitting: Neurology

## 2016-08-19 ENCOUNTER — Telehealth: Payer: Self-pay | Admitting: Family Medicine

## 2016-08-19 ENCOUNTER — Encounter: Payer: Self-pay | Admitting: Family Medicine

## 2016-08-19 ENCOUNTER — Ambulatory Visit (INDEPENDENT_AMBULATORY_CARE_PROVIDER_SITE_OTHER): Payer: BC Managed Care – PPO | Admitting: Family Medicine

## 2016-08-19 ENCOUNTER — Ambulatory Visit (INDEPENDENT_AMBULATORY_CARE_PROVIDER_SITE_OTHER)
Admission: RE | Admit: 2016-08-19 | Discharge: 2016-08-19 | Disposition: A | Discharge status: Home / Self Care | Payer: BC Managed Care – PPO | Source: Ambulatory Visit | Attending: Family Medicine | Admitting: Family Medicine

## 2016-08-19 VITALS — BP 116/80 | HR 92 | Resp 12 | Ht 68.0 in | Wt 136.0 lb

## 2016-08-19 DIAGNOSIS — R296 Repeated falls: Secondary | ICD-10-CM

## 2016-08-19 DIAGNOSIS — R9389 Abnormal findings on diagnostic imaging of other specified body structures: Secondary | ICD-10-CM

## 2016-08-19 DIAGNOSIS — R0989 Other specified symptoms and signs involving the circulatory and respiratory systems: Secondary | ICD-10-CM

## 2016-08-19 DIAGNOSIS — R5383 Other fatigue: Secondary | ICD-10-CM | POA: Diagnosis not present

## 2016-08-19 DIAGNOSIS — R938 Abnormal findings on diagnostic imaging of other specified body structures: Secondary | ICD-10-CM

## 2016-08-19 DIAGNOSIS — R251 Tremor, unspecified: Secondary | ICD-10-CM | POA: Diagnosis not present

## 2016-08-19 LAB — CBC WITH DIFFERENTIAL/PLATELET
BASOS PCT: 0.8 % (ref 0.0–3.0)
Basophils Absolute: 0 10*3/uL (ref 0.0–0.1)
EOS PCT: 2.2 % (ref 0.0–5.0)
Eosinophils Absolute: 0.1 10*3/uL (ref 0.0–0.7)
HCT: 41.3 % (ref 36.0–46.0)
Hemoglobin: 13.5 g/dL (ref 12.0–15.0)
LYMPHS ABS: 0.9 10*3/uL (ref 0.7–4.0)
Lymphocytes Relative: 14.4 % (ref 12.0–46.0)
MCHC: 32.7 g/dL (ref 30.0–36.0)
MCV: 95.4 fl (ref 78.0–100.0)
MONO ABS: 0.6 10*3/uL (ref 0.1–1.0)
Monocytes Relative: 9.1 % (ref 3.0–12.0)
NEUTROS ABS: 4.7 10*3/uL (ref 1.4–7.7)
NEUTROS PCT: 73.5 % (ref 43.0–77.0)
PLATELETS: 182 10*3/uL (ref 150.0–400.0)
RBC: 4.33 Mil/uL (ref 3.87–5.11)
RDW: 13.3 % (ref 11.5–15.5)
WBC: 6.4 10*3/uL (ref 4.0–10.5)

## 2016-08-19 LAB — COMPREHENSIVE METABOLIC PANEL
ALK PHOS: 46 U/L (ref 39–117)
ALT: 15 U/L (ref 0–35)
AST: 21 U/L (ref 0–37)
Albumin: 4.4 g/dL (ref 3.5–5.2)
BUN: 14 mg/dL (ref 6–23)
CHLORIDE: 101 meq/L (ref 96–112)
CO2: 30 mEq/L (ref 19–32)
Calcium: 9.6 mg/dL (ref 8.4–10.5)
Creatinine, Ser: 0.79 mg/dL (ref 0.40–1.20)
GFR: 75.1 mL/min (ref >60.00)
GLUCOSE: 88 mg/dL (ref 70–99)
POTASSIUM: 4.3 meq/L (ref 3.5–5.1)
Sodium: 138 mEq/L (ref 135–145)
TOTAL PROTEIN: 7 g/dL (ref 6.0–8.3)
Total Bilirubin: 0.6 mg/dL (ref 0.2–1.2)

## 2016-08-19 LAB — VITAMIN B12: Vitamin B-12: 1238 pg/mL — ABNORMAL HIGH (ref 211–911)

## 2016-08-19 LAB — TSH: TSH: 1 u[IU]/mL (ref 0.35–4.50)

## 2016-08-19 MED ORDER — DOXYCYCLINE HYCLATE 100 MG PO TABS: 100.0000 mg | ORAL_TABLET | Freq: Two times a day (BID) | ORAL | 0 refills | Status: DC

## 2016-08-19 NOTE — Telephone Encounter (Signed)
Please advise 

## 2016-08-19 NOTE — Patient Instructions (Signed)
A few things to remember from today's visit:   Tremor, unspecified - Plan: Ambulatory referral to Neurology, CBC with Differential/Platelet, Comprehensive metabolic panel, TSH, Vitamin B12  Frequent falls - Plan: CBC with Differential/Platelet, Comprehensive metabolic panel  Abnormal lung sounds - Plan: DG Chest 2 View   Please be sure medication list is accurate. If a new problem present, please set up appointment sooner than planned today.

## 2016-08-19 NOTE — Progress Notes (Signed)
Report of recent CXR is back and on right side ? Mucus plug (atelectasis) vs early pneumonia. She did not report acute symptoms, still trial of abx recommended (Doxycycline 100 mg bid x 7 days) and we need to repeat CXR in 3-4 weeks. Thanks

## 2016-08-19 NOTE — Telephone Encounter (Signed)
Pt states she did not realize the Rx prescribed this am was doxycycline (VIBRA-TABS) 100 MG tablet  Pt states she took last year and it made her sick on her stomach, diarrhea and she does not want to take this again,  Would like another med.  pt did not pick this up.  [id number] - [place], [place] [address]. AT [address]

## 2016-08-19 NOTE — Telephone Encounter (Signed)
Spoke with patient, advised her that Augmentin is not the first choice, but she would like to try it over the Doxycycline. Advised patient to make sure she eats when she takes the antibiotic.

## 2016-08-19 NOTE — Telephone Encounter (Signed)
I sent message after receiving CXR report.

## 2016-08-20 ENCOUNTER — Other Ambulatory Visit: Payer: Self-pay

## 2016-08-20 DIAGNOSIS — R0989 Other specified symptoms and signs involving the circulatory and respiratory systems: Secondary | ICD-10-CM

## 2016-08-20 LAB — RPR

## 2016-08-20 MED ORDER — AMOXICILLIN-POT CLAVULANATE 875-125 MG PO TABS: 1.0000 | ORAL_TABLET | Freq: Two times a day (BID) | ORAL | 0 refills | Status: AC

## 2016-08-20 NOTE — Telephone Encounter (Signed)
Rx for Augmentin was sent.

## 2016-08-21 ENCOUNTER — Encounter: Payer: Self-pay | Admitting: Family Medicine

## 2016-08-25 ENCOUNTER — Encounter: Payer: Self-pay | Admitting: Family Medicine

## 2016-08-25 NOTE — Progress Notes (Signed)
Stephanie Shaw was seen today in the movement disorders clinic for neurologic consultation at the request of [name], [name], MD.  The consultation is for the evaluation of tremor.  The records that were made available to me were reviewed.   Tremor: Yes.     How long has it been going on? 1-2 years  At rest or with activation?  With activation  Fam hx of tremor?  Yes.    Located where?  "all over" - jaw, voice, left leg, both arms  Affected by caffeine:  No. (1 cup coffee per day)  Affected by alcohol:  Doesn't drink enough to know  Affected by stress:  unsure  Affected by fatigue: unsure  Spills soup if on spoon:  Will be okay if holds spoon tight  Spills glass of liquid if full:  No.  Affects ADL's (tying shoes, brushing teeth, etc):  Yes.  , but just flossing teeth  Other Specific Symptoms:  Voice: noted vocal tremor Sleep: "I'm sleeping all day and all night"  Vivid Dreams:  No.  Acting out dreams:  No. Wet Pillows: occasionally Postural symptoms:  Yes.  , some  Falls?  Yes.  , 2 in the last year but "little falls" - the prior year had fx both arms Bradykinesia symptoms: difficulty getting out of a chair and difficulty regaining balance Loss of smell:  No. Loss of taste:  No. Urinary Incontinence:  No. Difficulty Swallowing:  No. (rarely has trouble) Handwriting, micrographia: Yes.   Trouble buttoning clothing: Yes.   Depression:  Yes.  , just mild because just retired and was troublesome for her Memory changes:  No. Hallucinations:  No.  visual distortions: No. N/V:  No. Lightheaded:  No.  Syncope: No. Diplopia:  No. Dyskinesia:  No.  Neuroimaging has previously been performed.  Patient had a CT of the brain in April, 2017 after a fall.  I reviewed this.  It was unremarkable.  PREVIOUS MEDICATIONS: none to date  ALLERGIES:   Allergies  Allergen Reactions  . Doxycycline Nausea And Vomiting  . Sulfonamide Derivatives     REACTION: HIVES    CURRENT  MEDICATIONS:  Outpatient Encounter Prescriptions as of 08/26/2016  Medication Sig  . amoxicillin-clavulanate (AUGMENTIN) 875-125 MG tablet Take 1 tablet by mouth 2 (two) times daily.  . cholecalciferol (VITAMIN D) 400 units TABS tablet Take 400 Units by mouth daily.  Marland Kitchen diltiazem (CARDIZEM CD) 180 MG 24 hr capsule TAKE ONE CAPSULE EVERY DAY  . estradiol (ESTRACE VAGINAL) 0.1 MG/GM vaginal cream INSERT 1/4 OF APPLICATORFUL VAGINALLY TWICE PER WEEK  . ferrous sulfate 325 (65 FE) MG EC tablet Take 325 mg by mouth 2 (two) times a week.   . Flaxseed, Linseed, (FLAX SEED OIL) 1000 MG CAPS Take 2 capsules by mouth 2 (two) times daily.   Alveda Reasons 20 MG TABS tablet TAKE 1 TABLET BY MOUTH ONCE DAILY  . [DISCONTINUED] Magnesium 250 MG TABS Take 1 tablet by mouth at bedtime.  . [DISCONTINUED] Multiple Vitamin (MULTIVITAMIN) tablet Take 1 tablet by mouth daily.     No facility-administered encounter medications on file as of 08/26/2016.     PAST MEDICAL HISTORY:   Past Medical History:  Diagnosis Date  . Colles' fracture of right radius 03/05/2015  . DEGENERATIVE JOINT DISEASE, RIGHT HIP 02/16/2007  . Dupuytren's contracture   . Osteoarthritis of hip    right  . Osteoarthritis of left knee   . POSTMENOPAUSAL SYNDROME 02/16/2007  . 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 S/P breast biopsy, left    two o'clock position - benign  . 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 Venous insufficiency     PAST SURGICAL HISTORY:   Past Surgical History:  Procedure Laterality Date  . BREAST BIOPSY Left     SOCIAL HISTORY:   Social History   Social History  . Marital status: Divorced    Spouse name: N/A  . Number of children: N/A  . Years of education: N/A   Occupational History  . Not on file.   Social History Main Topics  . Smoking status: Former Smoker    Quit date: 08/27/1971  . Smokeless tobacco: Never Used  . Alcohol use No     Comment: hardly ever  . Drug use: No  . Sexual activity:  Not on file   Other Topics Concern  . Not on file   Social History Narrative  . No narrative on file    FAMILY HISTORY:   Family Status  Relation Status  . Father Deceased  . Mother Deceased  . Sister Alive  . Sister Alive  . Child Alive    ROS: feels like L side of body doesn't work like the right side.  Some GI upset due to abx right now.   A complete 10 system review of systems was obtained and was unremarkable apart from what is mentioned above.  PHYSICAL EXAMINATION:    VITALS:   Vitals:   08/26/16 0829  BP: 102/60  Pulse: 90  SpO2: 95%  Weight: 134 lb (60.8 kg)  Height: 5\' 8"  (1.727 m)    GEN:  The patient appears stated age and is in NAD. HEENT:  Normocephalic, atraumatic.  The mucous membranes are moist. The superficial temporal arteries are without ropiness or tenderness. CV:  RRR Lungs:  CTAB Neck/HEME:  There are no carotid bruits bilaterally.  Neurological examination:  Orientation: The patient is alert and oriented x3. Fund of knowledge is appropriate.  Recent and remote memory are intact.  Attention and concentration are normal.    Able to name objects and repeat phrases. Cranial nerves: There is good facial symmetry. There is facial hypomimia  Pupils are equal round and reactive to light bilaterally. Fundoscopic exam reveals clear margins bilaterally. Extraocular muscles are intact. The visual fields are full to confrontational testing. The speech is fluent and clear. She is hypophonic.  Soft palate rises symmetrically and there is no tongue deviation. Hearing is intact to conversational tone. Sensation: Sensation is intact to light and pinprick throughout (facial, trunk, extremities). Vibration is absent at the bilateral big toe but intact at the ankle. There is no extinction with double simultaneous stimulation. There is no sensory dermatomal level identified. Motor: Strength is 5/5 in the bilateral upper and lower extremities.   Shoulder shrug is equal  and symmetric.  There is no pronator drift. Deep tendon reflexes: Deep tendon reflexes are 2/4 at the bilateral biceps, triceps, brachioradialis, patella and achilles. Plantar responses are downgoing bilaterally.  Movement examination: Tone: There is increased tone in the LLE but it is normal elsewhere. Abnormal movements: There is no tremor even with distraction procedures.  No tremor of outstretched hands or with intention.  she has no difficulty when asked to pour a full glass of water from one glass to another. Coordination:  There is decremation with RAM's, with any form of RAMS, including alternating supination and pronation of the forearm, hand opening and closing, finger taps, heel taps and  toe taps on the L.  RAMs on the right are good Gait and Station: The patient has some difficulty arising out of a deep-seated chair without the use of the hands but she is able to do it on the 3rd attempt. The patient's stride length is minimally decreased.    Labs:  Lab Results  Component Value Date   VITAMINB12 1,238 (H) 08/19/2016     Chemistry      Component Value Date/Time   NA 138 08/19/2016 1029   NA 139 05/29/2016 1043   K 4.3 08/19/2016 1029   CL 101 08/19/2016 1029   CO2 30 08/19/2016 1029   BUN 14 08/19/2016 1029   BUN 22 05/29/2016 1043   CREATININE 0.79 08/19/2016 1029   CREATININE 0.92 06/14/2015 0853      Component Value Date/Time   CALCIUM 9.6 08/19/2016 1029   ALKPHOS 46 08/19/2016 1029   AST 21 08/19/2016 1029   ALT 15 08/19/2016 1029   BILITOT 0.6 08/19/2016 1029     Lab Results  Component Value Date   TSH 1.00 08/19/2016      ASSESSMENT/PLAN:  1.  Parkinsonism  -I'm concerned for the early development of PD but she is just minimally rigid and only in the L leg. She does have some bradykinesia.   Her insurance will not allow for DaT scan but she tells me that she will go onto medicare next month.  She would like to pursue DaT when able.  I explained that this  is not a diagnostic scan and that this just gives [place] some clues.    -we will do an MRI brain without gad.  -safety discussed   2.  I will follow up with her after the above is completed.  Much greater than 50% of this visit was spent in counseling and coordinating care.  Total face to face time:  60 min   Cc:  [name], [name], MD

## 2016-08-26 ENCOUNTER — Ambulatory Visit (INDEPENDENT_AMBULATORY_CARE_PROVIDER_SITE_OTHER): Payer: Medicare Other | Admitting: Neurology

## 2016-08-26 ENCOUNTER — Encounter: Payer: Self-pay | Admitting: Neurology

## 2016-08-26 VITALS — BP 102/60 | HR 90 | Ht 68.0 in | Wt 134.0 lb

## 2016-08-26 DIAGNOSIS — R531 Weakness: Secondary | ICD-10-CM

## 2016-08-26 DIAGNOSIS — G20 Parkinson's disease: Secondary | ICD-10-CM | POA: Diagnosis not present

## 2016-08-26 NOTE — Patient Instructions (Signed)
1. We have sent a referral to [facility] for your MRI and they will call you directly to schedule your appt. They are located at [address]. If you need to contact them directly please call [phone].  2. When you get your medicare activation let me know and we will order DAT scan.

## 2016-08-29 ENCOUNTER — Telehealth: Payer: Self-pay | Admitting: Family Medicine

## 2016-08-29 NOTE — Telephone Encounter (Signed)
Pt is calling stating that she is not feeling any better stomach hurts and very fatigue finished taking her antibiotic on Tuesday and wanted to know where do she go for here.

## 2016-09-01 ENCOUNTER — Encounter: Payer: Self-pay | Admitting: Family Medicine

## 2016-09-01 NOTE — Telephone Encounter (Signed)
I already sent a message through My Chart. She has been fatigue and having health issues for the past few months. Lab work was done, referral to neuro, and abx for possible early pneumonia was recommended.  If she is having sudden worsening of symptoms, fever, vomiting, dyspnea, chest pain, abdominal pain, MS changes, focal weakness among some she needs to be evaluated in the ER .  Thanks, BJ

## 2016-09-01 NOTE — Telephone Encounter (Signed)
Pt calling and would like to have a call back today.  Due to her feeling poorly.

## 2016-09-02 ENCOUNTER — Other Ambulatory Visit: Payer: Self-pay | Admitting: Family Medicine

## 2016-09-02 MED ORDER — ONDANSETRON HCL 4 MG PO TABS: 4.0000 mg | ORAL_TABLET | Freq: Two times a day (BID) | ORAL | 0 refills | Status: DC | PRN

## 2016-09-02 NOTE — Telephone Encounter (Signed)
I spoke with patient, she would like the nausea medication sent into the pharmacy.

## 2016-09-02 NOTE — Telephone Encounter (Signed)
Rx sent. Please advise to follow up in 7-10 days if nausea is not better.  Thanks, BJ

## 2016-09-02 NOTE — Telephone Encounter (Signed)
Called and spoke with patient. Advised that Rx has been sent into the pharmacy & to follow up in a week if no improvement. Also advised of warning signs listed below. Patient verbalized understanding.

## 2016-09-05 ENCOUNTER — Encounter: Payer: Self-pay | Admitting: Family Medicine

## 2016-09-05 ENCOUNTER — Ambulatory Visit
Admission: RE | Admit: 2016-09-05 | Discharge: 2016-09-05 | Disposition: A | Discharge status: Home / Self Care | Payer: Medicare Other | Source: Ambulatory Visit | Attending: Neurology | Admitting: Neurology

## 2016-09-05 DIAGNOSIS — R531 Weakness: Secondary | ICD-10-CM

## 2016-09-05 DIAGNOSIS — R251 Tremor, unspecified: Secondary | ICD-10-CM | POA: Diagnosis not present

## 2016-09-08 ENCOUNTER — Telehealth: Payer: Self-pay | Admitting: Neurology

## 2016-09-08 NOTE — Telephone Encounter (Signed)
-----   Message from [place], DO sent at 09/08/2016  7:43 AM EDT ----- Reviewed.  Torina Ey, let patient know that MRI is normal

## 2016-09-08 NOTE — Telephone Encounter (Signed)
Patient made aware of normal results

## 2016-09-08 NOTE — Telephone Encounter (Signed)
PT returned your call..  °

## 2016-09-08 NOTE — Telephone Encounter (Signed)
Left message on machine for patient to call back.

## 2016-09-09 ENCOUNTER — Telehealth: Payer: Self-pay | Admitting: Neurology

## 2016-09-09 DIAGNOSIS — R251 Tremor, unspecified: Secondary | ICD-10-CM

## 2016-09-09 NOTE — Telephone Encounter (Signed)
Patient's daughter called Baldo Ash regarding needing to get a scan Re scheduled for her mother regarding Parkinson's. Please Advise. Thanks

## 2016-09-09 NOTE — Telephone Encounter (Signed)
Spoke with patient's daughter. She wants [place] to go ahead and order DAT scan even though she won't have appropriate insurance coverage until September 1st. She wants a date set. Order entered and note written on referral in hopes that prior authorization isn't initiated until after September 1st.

## 2016-09-13 ENCOUNTER — Encounter: Payer: Self-pay | Admitting: Neurology

## 2016-09-13 ENCOUNTER — Encounter: Payer: Self-pay | Admitting: Family Medicine

## 2016-09-17 ENCOUNTER — Ambulatory Visit (INDEPENDENT_AMBULATORY_CARE_PROVIDER_SITE_OTHER)
Admission: RE | Admit: 2016-09-17 | Discharge: 2016-09-17 | Disposition: A | Discharge status: Home / Self Care | Payer: Medicare Other | Source: Ambulatory Visit | Attending: Family Medicine | Admitting: Family Medicine

## 2016-09-17 ENCOUNTER — Telehealth: Payer: Self-pay | Admitting: Neurology

## 2016-09-17 DIAGNOSIS — R0989 Other specified symptoms and signs involving the circulatory and respiratory systems: Secondary | ICD-10-CM | POA: Diagnosis not present

## 2016-09-17 DIAGNOSIS — J449 Chronic obstructive pulmonary disease, unspecified: Secondary | ICD-10-CM | POA: Diagnosis not present

## 2016-09-17 NOTE — Telephone Encounter (Signed)
Patient wants to come pick up a brochure for DAT scan. Brochure at front for pick up.

## 2016-09-17 NOTE — Telephone Encounter (Signed)
Patient wants to talk to someone about the DAT scan she is having done. She wants to get some information about the scan

## 2016-09-18 ENCOUNTER — Telehealth: Payer: Self-pay | Admitting: Family Medicine

## 2016-09-18 ENCOUNTER — Encounter: Payer: Self-pay | Admitting: Family Medicine

## 2016-09-18 ENCOUNTER — Other Ambulatory Visit: Payer: Self-pay | Admitting: Family Medicine

## 2016-09-18 DIAGNOSIS — R9389 Abnormal findings on diagnostic imaging of other specified body structures: Secondary | ICD-10-CM

## 2016-09-18 NOTE — Telephone Encounter (Signed)
° ° ° ° °  Pt would like her chest xray results

## 2016-09-19 ENCOUNTER — Ambulatory Visit: Payer: BC Managed Care – PPO | Admitting: Neurology

## 2016-09-19 NOTE — Telephone Encounter (Signed)
Patient has seen results on my chart & responded.

## 2016-09-24 ENCOUNTER — Ambulatory Visit (INDEPENDENT_AMBULATORY_CARE_PROVIDER_SITE_OTHER)
Admission: RE | Admit: 2016-09-24 | Discharge: 2016-09-24 | Disposition: A | Discharge status: Home / Self Care | Payer: Medicare Other | Source: Ambulatory Visit | Attending: Family Medicine | Admitting: Family Medicine

## 2016-09-24 DIAGNOSIS — R9389 Abnormal findings on diagnostic imaging of other specified body structures: Secondary | ICD-10-CM

## 2016-09-24 DIAGNOSIS — R938 Abnormal findings on diagnostic imaging of other specified body structures: Secondary | ICD-10-CM

## 2016-09-24 MED ORDER — IOPAMIDOL (ISOVUE-300) INJECTION 61%
80.0000 mL | Freq: Once | INTRAVENOUS | Status: AC | PRN
  Administered 2016-09-24: 80 mL via INTRAVENOUS

## 2016-09-25 ENCOUNTER — Encounter (HOSPITAL_COMMUNITY)
Admission: RE | Admit: 2016-09-25 | Discharge: 2016-09-25 | Disposition: A | Discharge status: Home / Self Care | Payer: Medicare Other | Source: Ambulatory Visit | Attending: Neurology | Admitting: Neurology

## 2016-09-25 ENCOUNTER — Encounter (HOSPITAL_COMMUNITY): Payer: Medicare Other

## 2016-09-25 DIAGNOSIS — R251 Tremor, unspecified: Secondary | ICD-10-CM | POA: Diagnosis present

## 2016-09-25 MED ORDER — IOFLUPANE I 123 185 MBQ/2.5ML IV SOLN
4.7000 | Freq: Once | INTRAVENOUS | Status: AC
  Administered 2016-09-25: 4.7 via INTRAVENOUS

## 2016-09-25 MED ORDER — IODINE STRONG (LUGOLS) 5 % PO SOLN
0.8000 mL | Freq: Once | ORAL | Status: AC
  Administered 2016-09-25: 0.8 mL via ORAL
  Filled 2016-09-25: qty 0.8

## 2016-09-26 ENCOUNTER — Telehealth: Payer: Self-pay | Admitting: Neurology

## 2016-09-26 ENCOUNTER — Telehealth: Payer: Self-pay | Admitting: Family Medicine

## 2016-09-26 ENCOUNTER — Encounter: Payer: Self-pay | Admitting: Family Medicine

## 2016-09-26 NOTE — Telephone Encounter (Signed)
Pt called and left a message regarding her DAT test she had

## 2016-09-26 NOTE — Telephone Encounter (Signed)
-----   Message from [place], DO sent at 09/26/2016 11:49 AM EDT ----- Reviewed.  Mostly, decreased uptake on the right (consistent with symptoms).  Mechele Kittleson, you can let pt know that scan showed decreased dopamine in brain, which is NOT diagnostic of ANY particular disease state as we discussed previously.  I will go over details with her at her follow up appointment

## 2016-09-26 NOTE — Telephone Encounter (Signed)
Spoke with patient and made her aware. She states she needs seen sooner than her November appt because she is "falling apart". When I asked what seemed to be worse she states that her left arm/hand are weaker and her left hand won't "do what it's supposed to".   She was put on a cancellation list.

## 2016-09-26 NOTE — Telephone Encounter (Signed)
Pt is calling to discuss her CT results

## 2016-09-26 NOTE — Telephone Encounter (Signed)
I called and spoke with patient. We went over the [place] message about her CT results. She has had the enlarged thyroid for quite some time. She is okay with doing an ultrasound, her last one was about 10 years ago. She also wanted a note sent to her cardiologist about the heart that was mentioned in the imaging.

## 2016-09-26 NOTE — Telephone Encounter (Signed)
Documented in previous open encounter.

## 2016-09-30 ENCOUNTER — Other Ambulatory Visit: Payer: Self-pay | Admitting: Family Medicine

## 2016-09-30 ENCOUNTER — Inpatient Hospital Stay: Admission: RE | Admit: 2016-09-30 | Payer: BC Managed Care – PPO | Source: Ambulatory Visit

## 2016-09-30 DIAGNOSIS — E042 Nontoxic multinodular goiter: Secondary | ICD-10-CM

## 2016-09-30 NOTE — Telephone Encounter (Signed)
Thyroid US order placed. Please fax copy of report to her cardiologists as requested.  Thanks, BJ

## 2016-09-30 NOTE — Telephone Encounter (Signed)
Copy faxed over to Dr. Acie Fredrickson.

## 2016-10-01 ENCOUNTER — Ambulatory Visit
Admission: RE | Admit: 2016-10-01 | Discharge: 2016-10-01 | Disposition: A | Discharge status: Home / Self Care | Payer: Medicare Other | Source: Ambulatory Visit | Attending: Family Medicine | Admitting: Family Medicine

## 2016-10-01 DIAGNOSIS — E042 Nontoxic multinodular goiter: Secondary | ICD-10-CM

## 2016-10-05 ENCOUNTER — Encounter: Payer: Self-pay | Admitting: Family Medicine

## 2016-10-07 ENCOUNTER — Telehealth: Payer: Self-pay | Admitting: Neurology

## 2016-10-07 DIAGNOSIS — R531 Weakness: Secondary | ICD-10-CM

## 2016-10-07 DIAGNOSIS — R296 Repeated falls: Secondary | ICD-10-CM

## 2016-10-07 NOTE — Telephone Encounter (Signed)
Referral entered. Daughter made aware. [facility]

## 2016-10-07 NOTE — Telephone Encounter (Signed)
Spoke with daughter and explained this to her. She had a lot of questions. I tried to explain to her the purpose of the DAT scan.   Main complaint from patient is leg weakness and falls. Is it okay to go ahead and order physical therapy while she waits for appt with you to help with these symptoms?

## 2016-10-07 NOTE — Telephone Encounter (Signed)
I gave them results.  It doesn't change what I am going to do at all at that visit.  I discussed this with the patient at her previous visit before it was ordered.

## 2016-10-07 NOTE — Telephone Encounter (Signed)
I don't know what else to tell them. They are on a wait list. Please advise.

## 2016-10-07 NOTE — Telephone Encounter (Signed)
Patient's daughter called very upset. She said she feels her mom should not have to wait until November after having her DAT scan results. She feels she should be seen sooner to know what the next step is? Please Advise. Thanks

## 2016-10-07 NOTE — Telephone Encounter (Signed)
yes

## 2016-10-08 ENCOUNTER — Telehealth: Payer: Self-pay | Admitting: Rehabilitative and Restorative Service Providers"

## 2016-10-08 ENCOUNTER — Ambulatory Visit: Payer: Medicare Other | Attending: Neurology | Admitting: Rehabilitative and Restorative Service Providers"

## 2016-10-08 DIAGNOSIS — M6281 Muscle weakness (generalized): Secondary | ICD-10-CM | POA: Diagnosis present

## 2016-10-08 DIAGNOSIS — R29898 Other symptoms and signs involving the musculoskeletal system: Secondary | ICD-10-CM

## 2016-10-08 DIAGNOSIS — R2689 Other abnormalities of gait and mobility: Secondary | ICD-10-CM | POA: Diagnosis not present

## 2016-10-08 DIAGNOSIS — R29818 Other symptoms and signs involving the nervous system: Secondary | ICD-10-CM | POA: Insufficient documentation

## 2016-10-08 DIAGNOSIS — R293 Abnormal posture: Secondary | ICD-10-CM | POA: Insufficient documentation

## 2016-10-08 NOTE — Telephone Encounter (Signed)
Stephanie Shaw, can you place order please

## 2016-10-08 NOTE — Telephone Encounter (Signed)
Dr. Carles Collet, Ms. Stephanie Shaw was evaluated by PT on 10/08/16.  The patient would benefit from OT evaluation due to L UE weakness, decreased functional use of L hand.   If you agree, please place an order in [facility] workque in [facility] or fax the order to [phone]. Thank you, Mckay Tegtmeyer, PT

## 2016-10-08 NOTE — Therapy (Signed)
[facility] [address] [place], [place], [address] Phone: [phone]   Fax:  [phone]  Physical Therapy Evaluation  Patient Details  Name: Stephanie Shaw MRN: [id number] Date of Birth: 12-Jun-1940 Referring Provider: Alonza Bogus, DO  Encounter Date: 10/08/2016      PT End of Session - 10/08/16 1438    Visit Number 1   Number of Visits 16   Date for PT Re-Evaluation 12/07/16   Authorization Type G code every 10th visit   PT Start Time 0935   PT Stop Time 1018   PT Time Calculation (min) 43 min   Equipment Utilized During Treatment Gait belt   Activity Tolerance Patient tolerated treatment well   Behavior During Therapy [facility] for tasks assessed/performed      Past Medical History:  Diagnosis Date  . Colles' fracture of right radius 03/05/2015  . DEGENERATIVE JOINT DISEASE, RIGHT HIP 02/16/2007  . Dupuytren's contracture   . Osteoarthritis of hip    right  . Osteoarthritis of left knee   . POSTMENOPAUSAL SYNDROME 02/16/2007  . PREMATURE ATRIAL CONTRACTIONS 02/16/2007  . S/P breast biopsy, left    two o'clock position - benign  . 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 Venous insufficiency     Past Surgical History:  Procedure Laterality Date  . BREAST BIOPSY Left   . CATARACT EXTRACTION, BILATERAL      There were no vitals filed for this visit.       Subjective Assessment - 10/08/16 0939    Subjective The patient notes worsening symptoms of imbalance and weakness over the past year.  She notes she had 2 falls in 2017 with fractures of her R and L UEs.  She notes weakness, tremors in the left side, as well as imbalance as main concerns.   Patient notes 1 fall in the past 6 months.  Falls have occurred outdoors on steps.  DaT scan shows low dopamine per patient report.   Pertinent History a-fib, cateract surgery   Patient Stated Goals "I'd like to get my motion back in my left side, try to help  tremors."   Currently in Pain? No/denies            [facility] PT Assessment - 10/08/16 0943      Assessment   Medical Diagnosis Parkinsonism, weakness, falls   Referring Provider Wells Guiles [name], DO   Onset Date/Surgical Date --  worsening/progressive over past year   Prior Therapy none     Precautions   Precautions Fall   Precaution Comments h/o serious falls with fracture     Restrictions   Weight Bearing Restrictions No     Balance Screen   Has the patient fallen in the past 6 months Yes   How many times? 1 in 6 months, 2 serious falls last year   Has the patient had a decrease in activity level because of a fear of falling?  Yes  patient used to hike, ride bike   Is the patient reluctant to leave their home because of a fear of falling?  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     ROM /  Strength   AROM / PROM / Strength AROM;Strength     AROM   Overall AROM  Within functional limits for tasks performed     Strength   Overall Strength Deficits   Strength Assessment Site Shoulder;Elbow;Hip;Knee;Ankle   Right/Left Shoulder Right;Left   Right Shoulder Flexion 4+/5   Right Shoulder ABduction 4+/5   Left Shoulder Flexion 3/5   Left Shoulder ABduction 3/5   Right/Left Elbow Right;Left   Right Elbow Flexion 5/5   Right Elbow Extension 5/5   Left Elbow Flexion 4/5   Left Elbow Extension 5/5   Right/Left Hip Right;Left   Right Hip Flexion 5/5   Left Hip Flexion 4/5   Right/Left Knee Right;Left   Right Knee Flexion 5/5   Right Knee Extension 5/5   Left Knee Flexion 5/5   Left Knee Extension 5/5   Right/Left Ankle Right;Left   Right Ankle Dorsiflexion 5/5   Left Ankle Dorsiflexion 4/5     Bed Mobility   Bed Mobility Sit to Supine;Supine to Sit   Supine to Sit --  patient notes no difficulty with bed mobility     Transfers   Transfers Sit to Stand;Stand to Sit   Sit to Stand 7: Independent;Without upper extremity assist   Stand to Sit 7: Independent   Comments Notes she drive [employer] and it is hard to get out of low car.     Ambulation/Gait   Ambulation/Gait Yes   Ambulation/Gait Assistance 7: Independent   Ambulation Distance (Feet) 200 Feet   Assistive device None   Gait Pattern Decreased arm swing - right;Decreased arm swing - left;Decreased trunk rotation;Narrow base of support  UEs maintained at side in 70 degrees bilat elbow flexion   Ambulation Surface Level;Indoor   Gait velocity 3.67 ft/sec   Gait velocity - backwards 1.42 ft/sec   Stairs Yes   Stair Management Technique One rail Right;Alternating pattern   Number of Stairs 4   Gait Comments Patient notes apprehension walking backwards     Standardized Balance Assessment   Standardized Balance Assessment Berg Balance Test;Timed Up and Go Test     Berg Balance Test   Sit to Stand Able to  stand without using hands and stabilize independently   Standing Unsupported Able to stand safely 2 minutes   Sitting with Back Unsupported but Feet Supported on Floor or Stool Able to sit safely and securely 2 minutes   Stand to Sit Sits safely with minimal use of hands   Transfers Able to transfer safely, minor use of hands   Standing Unsupported with Eyes Closed Able to stand 10 seconds with supervision  Standing Ubsupported with Feet Together Able to place feet together independently and stand 1 minute safely   From Standing, Reach Forward with Outstretched Arm Can reach forward >12 cm safely (5")   From Standing Position, Pick up Object from [place] to pick up shoe safely and easily   From Standing Position, Turn to Look Behind Over each Shoulder Looks behind one side only/other side shows less weight shift  decreased rotation to the left side with tremor noted   Turn 360 Degrees Able to turn 360 degrees safely but slowly  festinating noted with tremor in left leg   Standing Unsupported, Alternately Place Feet on Step/Stool Able to complete 4 steps without aid or supervision   Standing Unsupported, One Foot in Front Able to take small step independently and hold 30 seconds   Standing on One Leg Tries to lift leg/unable to hold 3 seconds but remains standing independently   Total Score 44     Timed Up and Go Test   TUG --  11.37 sec   Cognitive TUG (seconds) 14.97  counting backwards from 100 by 3's     Functional Gait  Assessment   Gait assessed  Yes   Gait Level Surface Walks 20 ft in less than 5.5 sec, no assistive devices, good speed, no evidence for imbalance, normal gait pattern, deviates no more than 6 in outside of the 12 in walkway width.   Change in Gait Speed Able to smoothly change walking speed without loss of balance or gait deviation. Deviate no more than 6 in outside of the 12 in walkway width.   Gait with Horizontal Head Turns Performs head turns with moderate  changes in gait velocity, slows down, deviates 10-15 in outside 12 in walkway width but recovers, can continue to walk.   Gait with Vertical Head Turns Performs task with moderate change in gait velocity, slows down, deviates 10-15 in outside 12 in walkway width but recovers, can continue to walk.   Gait and Pivot Turn Turns slowly, requires verbal cueing, or requires several small steps to catch balance following turn and stop  several small steps with L LE tremor noted   Step Over Obstacle Is able to step over one shoe box (4.5 in total height) without changing gait speed. No evidence of imbalance.   Gait with Narrow Base of Support Ambulates less than 4 steps heel to toe or cannot perform without assistance.  2 steps   Gait with Eyes Closed Walks 20 ft, slow speed, abnormal gait pattern, evidence for imbalance, deviates 10-15 in outside 12 in walkway width. Requires more than 9 sec to ambulate 20 ft.  11.28 seconds   Ambulating Backwards Walks 20 ft, uses assistive device, slower speed, mild gait deviations, deviates 6-10 in outside 12 in walkway width.   Steps Alternating feet, must use rail.   Total Score 16   FGA comment: 16/30            Objective measurements completed on examination: See above findings.                  PT Education - 10/08/16 1437    Education provided Yes   Education Details Discussed goals of PT, local support group and exercise classes (will provide information through course of therapy)   Person(s) Educated Patient   Methods Explanation   Comprehension Verbalized understanding          PT Short Term Goals - 10/08/16 1446  PT SHORT TERM GOAL #1   Title The patient will be independent with home exercise program for large amplitude, weight shifting to improve functional mobility.   Time 4   Period Weeks   Target Date 11/07/16     PT SHORT TERM GOAL #2   Title The patient will be further assessed on 5x sit to stand and LTG to  follow if indicated.   Time 4   Period Weeks   Target Date 11/07/16     PT SHORT TERM GOAL #3   Title The patient will improve FGA to 20/30 to demonstrate improving dynamic gait activities.   Baseline 16/30   Time 4   Period Weeks   Target Date 11/07/16     PT SHORT TERM GOAL #4   Title The patient will improve backwards walking gait speed to > or equal to 1.8 ft/sec.   Baseline 1.42 ft/sec   Time 4   Period Weeks   Target Date 11/07/16     PT SHORT TERM GOAL #5   Title The patient will verbalize understanding of community resources for Parkinson's disease (including power over parkinson's and group exercise classes).   Time 4   Period Weeks   Target Date 11/07/16           PT Long Term Goals - 10/08/16 1448      PT LONG TERM GOAL #1   Title The patinet will be indep with HEP for post d/c progression of activities.   Time 8   Period Weeks   Target Date 12/07/16     PT LONG TERM GOAL #2   Title 5 time sit<>stand goal to follow, if indicated.   Time 8   Period Weeks   Target Date 12/07/16     PT LONG TERM GOAL #3   Title The patient will improve Berg balance test to > or equal to 48/56 to demo dec'd risk for falls.   Baseline Berg=44/56   Time 8   Period Weeks   Target Date 12/07/16     PT LONG TERM GOAL #4   Title The patient will improve FGA to > or equal to 23/30 to demo improving dynamic gait.   Baseline FGA=16/30   Time 8   Target Date 12/07/16     PT LONG TERM GOAL #5   Title The patient will demonstrate car transfer from her [facility] to demo safety with functional transfers.   Time 8   Period Weeks   Target Date 12/07/16     Additional Long Term Goals   Additional Long Term Goals Yes     PT LONG TERM GOAL #6   Title The patient will return to unlevel surface ambulation in order to return to level, wide path trails in order to participate in prior recreational activities.   Baseline patient unable to hike, participate in prior outdoor  activities   Time 8   Period Weeks   Target Date 12/07/16                Plan - 10/08/16 1452    Clinical Impression Statement The patient is a 76 year old female referred to OP PT with parkinsonism, weakness and h/o falls with injury.  At today's evaluation, patient demonstrates increased weakness t/o the L side, tremor with movement, decreased balance with fall risk noted per Berg and FGA, slowed gait speed in posterior direction with apprehension/fear of falling, abnormal gait mechanics, festination with 360 degree turns.  The  patient is extremely motivated to participate in therapy and inquires about "BIG" exercises.  PT educated patient on plan to address deficits in order to optimize current functional status and work towards return to safe community and recreational tasks.   History and Personal Factors relevant to plan of care: recently retired, 2 falls with injury in 2017, one fall 2018, decline in participation in recreational activities, stopped participating in gym routine   Clinical Presentation Evolving   Clinical Presentation due to: worsening left weakness and instability   Clinical Decision Making Moderate   Rehab Potential Good   Clinical Impairments Affecting Rehab Potential Enhancers to rehab potential are:  motivation, prior fitness level, engagement of patient.     PT Frequency 2x / week   PT Duration 8 weeks   PT Treatment/Interventions ADLs/Self Care Home Management;Therapeutic activities;Therapeutic exercise;Balance training;Neuromuscular re-education;Gait training;Stair training;Functional mobility training;Patient/family education;Manual techniques   PT Next Visit Plan Establish HEP for PWR/LSVT BIG, check on OT referral (requested after eval), gait training emphasizing UE movement,  L UE/LE strengthening and functional use, postural training   Consulted and Agree with Plan of Care Patient      Patient will benefit from skilled therapeutic intervention in  order to improve the following deficits and impairments:  Abnormal gait, Decreased balance, Decreased strength, Decreased coordination, Difficulty walking, Postural dysfunction, Decreased mobility (bradykinesia, tremors)  Visit Diagnosis: Other abnormalities of gait and mobility  Muscle weakness (generalized)  Abnormal posture  Other symptoms and signs involving the nervous system     Problem List Patient Active Problem List   Diagnosis Date Noted  . Long term (current) use of anticoagulants 05/29/2016  . Left wrist pain 01/03/2016  . Left hand pain 01/03/2016  . Vaginal atrophy 10/19/2015  . Tremor, unspecified 10/19/2015  . Osteopenia 10/19/2015  . 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 Closed fracture of part of upper end of humerus 05/01/2015  . Hematuria 02/04/2014  . 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 Cervical pain (neck) 06/20/2010  . Atrial fibrillation ([place]) 05/21/2010  . 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 Menopausal and postmenopausal disorder 02/16/2007  . DEGENERATIVE JOINT DISEASE, RIGHT HIP 02/16/2007    Yaeko Fazekas, PT 10/08/2016, 2:58 PM  [facility] [address] [place], [place], [address] Phone: [phone]   Fax:  [phone]  Name: Stephanie Shaw MRN: [id number] Date of Birth: February 11, 1940

## 2016-10-09 ENCOUNTER — Encounter: Payer: Self-pay | Admitting: Family Medicine

## 2016-10-09 NOTE — Addendum Note (Signed)
Addended byAnnamaria Helling on: 10/09/2016 07:46 AM   Modules accepted: Orders

## 2016-10-09 NOTE — Telephone Encounter (Signed)
Order placed

## 2016-10-10 ENCOUNTER — Ambulatory Visit: Payer: Medicare Other | Admitting: Physical Therapy

## 2016-10-10 DIAGNOSIS — R293 Abnormal posture: Secondary | ICD-10-CM

## 2016-10-10 DIAGNOSIS — R2689 Other abnormalities of gait and mobility: Secondary | ICD-10-CM | POA: Diagnosis not present

## 2016-10-10 DIAGNOSIS — R29818 Other symptoms and signs involving the nervous system: Secondary | ICD-10-CM

## 2016-10-10 DIAGNOSIS — M6281 Muscle weakness (generalized): Secondary | ICD-10-CM

## 2016-10-10 MED FILL — XARELTO 20 MG TABLET: 20 | 90 days supply | Qty: 90 | Fill #1

## 2016-10-10 NOTE — Patient Instructions (Addendum)
HIP: Hamstrings - Short Sitting    Rest leg on raised surface. Keep knee straight. Lift chest. Hold _15-30__ seconds. _3__ reps per set, __1-2_ sets per day.  Copyright  VHI. All rights reserved.  Sit to Stand / Stand to Sit / Transfers    Sit on edge of a solid chair with arms, feet flat on floor. Lean forward over feet and stand up, boosting up from the chair by pushing up from the arms of the chair. Sit down slowly with hands on chair arms.  YOU CAN ALSO DO THIS FROM THE BED HEIGHT, TO ALLOW FOR IMPROVED EASE OF TRANSFERS AND LEG STRENGTHENING. Repeat _5-10___ times per session. Do __1-2__ sessions per day.  Copyright  VHI. All rights reserved.      Sit to Stand Transfers:  1. Scoot out to the edge of the chair 2. Place your feet flat on the floor, shoulder width apart.  Make sure your feet are tucked just under your knees. 3. Lean forward (nose over toes) with momentum, and stand up tall with your best posture.  If you need to use your arms, use them as a quick boost up to stand. 4. If you are in a low or soft chair, you can lean back and then forward up to stand, in order to get more momentum. 5. Once you are standing, make sure you are looking ahead and standing tall.  To sit down:  1. Back up until you feel the chair behind your legs. 2. Bend at you hips, reaching  Back for you chair, if needed, then slowly squat to sit down on your chair.

## 2016-10-10 NOTE — Therapy (Signed)
[facility] [address] [place], [place], [address] Phone: [phone]   Fax:  [phone]  Physical Therapy Treatment  Patient Details  Name: Stephanie Shaw MRN: [id number] Date of Birth: 1940/07/23 Referring Provider: Alonza Bogus, DO  Encounter Date: 10/10/2016      PT End of Session - 10/10/16 2248    Visit Number 2   Number of Visits 16   Date for PT Re-Evaluation 12/07/16   Authorization Type G code every 10th visit   PT Start Time 1104   PT Stop Time 1144   PT Time Calculation (min) 40 min   Activity Tolerance Patient tolerated treatment well   Behavior During Therapy [facility] for tasks assessed/performed      Past Medical History:  Diagnosis Date  . Colles' fracture of right radius 03/05/2015  . DEGENERATIVE JOINT DISEASE, RIGHT HIP 02/16/2007  . Dupuytren's contracture   . Osteoarthritis of hip    right  . Osteoarthritis of left knee   . POSTMENOPAUSAL SYNDROME 02/16/2007  . PREMATURE ATRIAL CONTRACTIONS 02/16/2007  . S/P breast biopsy, left    two o'clock position - benign  . 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 Venous insufficiency     Past Surgical History:  Procedure Laterality Date  . BREAST BIOPSY Left   . CATARACT EXTRACTION, BILATERAL      There were no vitals filed for this visit.      Subjective Assessment - 10/10/16 1106    Subjective I remember that my handwriting has gotten so little over the past year, difficulty with brushing teeth and brushing hair.   Pertinent History a-fib, cateract surgery   Patient Stated Goals "I'd like to get my motion back in my left side, try to help tremors."   Currently in Pain? No/denies                         [facility] Adult PT Treatment/Exercise - 10/10/16 0001      Transfers   Transfers Sit to Stand;Stand to Sit   Sit to Stand 5: Supervision;Without upper extremity assist;With upper extremity assist;From chair/3-in-1;From bed    Sit to Stand Details --  does not fully extend knees in standing   Stand to Sit 5: Supervision;Without upper extremity assist;With upper extremity assist;To bed;To chair/3-in-1;Uncontrolled descent   Number of Reps 10 reps;2 sets from mat; then 10 reps from 18" chair   Transfer Cueing Cues for proper technique for sit<>stand, but also to encourage use of UEs for improved momentum to stand, and use of UEs for slowed descent into sitting.  Cues for full upright posture, activation of quads and gluts upon standing for full terminal knee extension.     Self-Care   Self-Care Other Self-Care Comments   Other Self-Care Comments  Pt reports difficulty with handwriting over the past year, and with other upper body grooming activities.  Discussed benefit of OT for addressing UE coordination, functional use for ADLs, with evaluating therapist requesting (and receiving) OT order.  Pt declines OT at this time.     Exercises   Exercises Knee/Hip     Knee/Hip Exercises: Stretches   Active Hamstring Stretch Right;Left;3 reps;30 seconds   Active Hamstring Stretch Limitations Seated foot propped on ground   Other Knee/Hip Stretches Pt shows therapist quick version of current stretching routine.  She is currently not performing seated hamstring stretch, so added to her HEP (due to pt not fully extending knees in  standing activities)           PWR [facility]) - 10/10/16 2232    PWR! exercises Moves in sitting;Moves in standing   PWR! Up x 10 reps with UEs supported at counter   PWR! Rock x 10 reps with UEs supported at [employer] Step x 10 reps to side, then 3 reps each leg forward, with UE support   Comments All exercises above performed with UE support, as pt noted to have increased lower extremity tremor with squats during PWR! Up and with rocking onto LLE and with forward step onto LLE.     PWR! Up sitting x 10 reps   Comments Max verbal and visual cues provided; tactile cues for open L hand and  extended L elbow, with VCs for attention to posture          Balance Exercises - 10/10/16 2241      Balance Exercises: Standing   Other Standing Exercises Stagger stance weightshifting forward/back x 10 reps each side, with UE support.             PT Education - 10/10/16 2248    Education provided Yes   Education Details HEP-see instructions; technique of sit<>stand   Person(s) Educated Patient   Methods Explanation;Demonstration;Verbal cues;Handout   Comprehension Verbalized understanding;Returned demonstration;Verbal cues required;Need further instruction          PT Short Term Goals - 10/08/16 1446      PT SHORT TERM GOAL #1   Title The patient will be independent with home exercise program for large amplitude, weight shifting to improve functional mobility.   Time 4   Period Weeks   Target Date 11/07/16     PT SHORT TERM GOAL #2   Title The patient will be further assessed on 5x sit to stand and LTG to follow if indicated.   Time 4   Period Weeks   Target Date 11/07/16     PT SHORT TERM GOAL #3   Title The patient will improve FGA to 20/30 to demonstrate improving dynamic gait activities.   Baseline 16/30   Time 4   Period Weeks   Target Date 11/07/16     PT SHORT TERM GOAL #4   Title The patient will improve backwards walking gait speed to > or equal to 1.8 ft/sec.   Baseline 1.42 ft/sec   Time 4   Period Weeks   Target Date 11/07/16     PT SHORT TERM GOAL #5   Title The patient will verbalize understanding of community resources for Parkinson's disease (including power over parkinson's and group exercise classes).   Time 4   Period Weeks   Target Date 11/07/16           PT Long Term Goals - 10/08/16 1448      PT LONG TERM GOAL #1   Title The patinet will be indep with HEP for post d/c progression of activities.   Time 8   Period Weeks   Target Date 12/07/16     PT LONG TERM GOAL #2   Title 5 time sit<>stand goal to follow, if indicated.    Time 8   Period Weeks   Target Date 12/07/16     PT LONG TERM GOAL #3   Title The patient will improve Berg balance test to > or equal to 48/56 to demo dec'd risk for falls.   Baseline Berg=44/56   Time 8   Period Weeks   Target  Date 12/07/16     PT LONG TERM GOAL #4   Title The patient will improve FGA to > or equal to 23/30 to demo improving dynamic gait.   Baseline FGA=16/30   Time 8   Target Date 12/07/16     PT LONG TERM GOAL #5   Title The patient will demonstrate car transfer from her [facility] to demo safety with functional transfers.   Time 8   Period Weeks   Target Date 12/07/16     Additional Long Term Goals   Additional Long Term Goals Yes     PT LONG TERM GOAL #6   Title The patient will return to unlevel surface ambulation in order to return to level, wide path trails in order to participate in prior recreational activities.   Baseline patient unable to hike, participate in prior outdoor activities   Time 8   Period Weeks   Target Date 12/07/16               Plan - 10/10/16 2249    Clinical Impression Statement Worked during today's session to initiate HEP.  With attempts at standing PWR! Moves, pt requires UE support during all exercises, and pt noted to have increased LLE tremor upon weightshifting and weightbearing through LLE, especially with squats or stepping activity.  Pt does respond well to cues for increased awareness of full knee extension and upright posture during repeated sit<>stand exercises and for standing in general.  Pt will continue to benefit from skilled PT to address balance, posture, gait activities for improved functional mobility.   Rehab Potential Good   Clinical Impairments Affecting Rehab Potential Enhancers to rehab potential are:  motivation, prior fitness level, engagement of patient.     PT Frequency 2x / week   PT Duration 8 weeks   PT Treatment/Interventions ADLs/Self Care Home Management;Therapeutic  activities;Therapeutic exercise;Balance training;Neuromuscular re-education;Gait training;Stair training;Functional mobility training;Patient/family education;Manual techniques   PT Next Visit Plan Review HEP provided 10/10/16 visit; work again on standing PWR! Moves (standing versus modified quadruped)-may likely need UE support; gait training with UE movement   PT Home Exercise Plan sit<>stand, hamstring stretch   Consulted and Agree with Plan of Care Patient      Patient will benefit from skilled therapeutic intervention in order to improve the following deficits and impairments:  Abnormal gait, Decreased balance, Decreased strength, Decreased coordination, Difficulty walking, Postural dysfunction, Decreased mobility (bradykinesia, tremors)  Visit Diagnosis: Other symptoms and signs involving the nervous system  Abnormal posture  Muscle weakness (generalized)     Problem List Patient Active Problem List   Diagnosis Date Noted  . Long term (current) use of anticoagulants 05/29/2016  . Left wrist pain 01/03/2016  . Left hand pain 01/03/2016  . Vaginal atrophy 10/19/2015  . Tremor, unspecified 10/19/2015  . Osteopenia 10/19/2015  . 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 Closed fracture of part of upper end of humerus 05/01/2015  . Hematuria 02/04/2014  . 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 Cervical pain (neck) 06/20/2010  . Atrial fibrillation ([place]) 05/21/2010  . 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 Menopausal and postmenopausal disorder 02/16/2007  . DEGENERATIVE JOINT DISEASE, RIGHT HIP 02/16/2007    Frazier Butt. 10/10/2016, 10:54 PM  Frazier Butt., PT   [facility] [address] [place], [place], [address] Phone: [phone]   Fax:  [phone]  Name: Stephanie Shaw MRN: [id number] Date of Birth: 07-15-1940

## 2016-10-13 ENCOUNTER — Ambulatory Visit: Payer: Medicare Other | Admitting: Physical Therapy

## 2016-10-13 ENCOUNTER — Encounter: Payer: Self-pay | Admitting: Physical Therapy

## 2016-10-13 DIAGNOSIS — R293 Abnormal posture: Secondary | ICD-10-CM

## 2016-10-13 DIAGNOSIS — R2689 Other abnormalities of gait and mobility: Secondary | ICD-10-CM | POA: Diagnosis not present

## 2016-10-13 DIAGNOSIS — R29818 Other symptoms and signs involving the nervous system: Secondary | ICD-10-CM

## 2016-10-13 DIAGNOSIS — M6281 Muscle weakness (generalized): Secondary | ICD-10-CM

## 2016-10-13 NOTE — Therapy (Signed)
[facility] [address] [place], [place], [address] Phone: [phone]   Fax:  [phone]  Physical Therapy Treatment  Patient Details  Name: Stephanie Shaw MRN: [id number] Date of Birth: Feb 05, 1940 Referring Provider: Alonza Bogus, DO  Encounter Date: 10/13/2016      PT End of Session - 10/13/16 1810    Visit Number 3   Number of Visits 16   Date for PT Re-Evaluation 12/07/16   Authorization Type G code every 10th visit   PT Start Time 0800   PT Stop Time 0845   PT Time Calculation (min) 45 min   Activity Tolerance Patient tolerated treatment well   Behavior During Therapy [facility] for tasks assessed/performed      Past Medical History:  Diagnosis Date  . Colles' fracture of right radius 03/05/2015  . DEGENERATIVE JOINT DISEASE, RIGHT HIP 02/16/2007  . Dupuytren's contracture   . Osteoarthritis of hip    right  . Osteoarthritis of left knee   . POSTMENOPAUSAL SYNDROME 02/16/2007  . PREMATURE ATRIAL CONTRACTIONS 02/16/2007  . S/P breast biopsy, left    two o'clock position - benign  . 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 Venous insufficiency     Past Surgical History:  Procedure Laterality Date  . BREAST BIOPSY Left   . CATARACT EXTRACTION, BILATERAL      There were no vitals filed for this visit.      Subjective Assessment - 10/13/16 0804    Subjective Exercises take up too much time. She already has an extensive set of exercises she does everyday (see therapeutic exercise section for details).    Pertinent History a-fib, cateract surgery   Patient Stated Goals "I'd like to get my motion back in my left side, try to help tremors."   Currently in Pain? No/denies                         [facility] Adult PT Treatment/Exercise - 10/13/16 1803      Transfers   Transfers Sit to Stand;Stand to Sit   Sit to Stand 5: Supervision;6: Modified independent (Device/Increase time);Without upper  extremity assist;From bed   Sit to Stand Details (indicate cue type and reason) vc for technique to allow no use of UEs   Number of Reps 10 reps     Ambulation/Gait   Ambulation/Gait Assistance 7: Independent     Posture/Postural Control   Posture/Postural Control Postural limitations   Postural Limitations Rounded Shoulders;Forward head  when seated   Posture Comments pt demonstrated ability to correct posture with cues     Exercises   Exercises Other Home Exercises: (pt demonstrated 2-5 reps of most exercises below to check for correct form)  Squats Toe raises Doorway stretch for shoulders Standing hamstring stretch Standing tibialis anterior stretch Tennis ball squeezes Standing bicep curls with resistance tubing Standing shoulder abdct with resistance tubing Abdominal crunches "mini" pushups "mini" pushups on knees Plank on elbows/forearms Quadruped lifting opposite arm and leg Prone superman Prone back extension with arms at her sides Triceps stretch (in tall kneeling) bil wrist extension with finger extension             PWR [facility]) - 10/13/16 1807    PWR! exercises Moves in standing   PWR! Up x 10   PWR! Rock [employer]! Twist x10   PWR! Step x10   Comments Pt required instructional cues for proprer technique, including open hands and  supinating forearms.              PT Education - 10/13/16 1809    Education provided Yes   Education Details rationale for discontinuing squeezing a tennis ball and substitute finger extension and abduction   Person(s) Educated Patient   Methods Explanation;Demonstration;Tactile cues;Verbal cues;Handout   Comprehension Verbalized understanding;Returned demonstration;Verbal cues required;Tactile cues required;Need further instruction          PT Short Term Goals - 10/08/16 1446      PT SHORT TERM GOAL #1   Title The patient will be independent with home exercise program for large amplitude, weight shifting to  improve functional mobility.   Time 4   Period Weeks   Target Date 11/07/16     PT SHORT TERM GOAL #2   Title The patient will be further assessed on 5x sit to stand and LTG to follow if indicated.   Time 4   Period Weeks   Target Date 11/07/16     PT SHORT TERM GOAL #3   Title The patient will improve FGA to 20/30 to demonstrate improving dynamic gait activities.   Baseline 16/30   Time 4   Period Weeks   Target Date 11/07/16     PT SHORT TERM GOAL #4   Title The patient will improve backwards walking gait speed to > or equal to 1.8 ft/sec.   Baseline 1.42 ft/sec   Time 4   Period Weeks   Target Date 11/07/16     PT SHORT TERM GOAL #5   Title The patient will verbalize understanding of community resources for Parkinson's disease (including power over parkinson's and group exercise classes).   Time 4   Period Weeks   Target Date 11/07/16           PT Long Term Goals - 10/08/16 1448      PT LONG TERM GOAL #1   Title The patinet will be indep with HEP for post d/c progression of activities.   Time 8   Period Weeks   Target Date 12/07/16     PT LONG TERM GOAL #2   Title 5 time sit<>stand goal to follow, if indicated.   Time 8   Period Weeks   Target Date 12/07/16     PT LONG TERM GOAL #3   Title The patient will improve Berg balance test to > or equal to 48/56 to demo dec'd risk for falls.   Baseline Berg=44/56   Time 8   Period Weeks   Target Date 12/07/16     PT LONG TERM GOAL #4   Title The patient will improve FGA to > or equal to 23/30 to demo improving dynamic gait.   Baseline FGA=16/30   Time 8   Target Date 12/07/16     PT LONG TERM GOAL #5   Title The patient will demonstrate car transfer from her [facility] to demo safety with functional transfers.   Time 8   Period Weeks   Target Date 12/07/16     Additional Long Term Goals   Additional Long Term Goals Yes     PT LONG TERM GOAL #6   Title The patient will return to unlevel surface  ambulation in order to return to level, wide path trails in order to participate in prior recreational activities.   Baseline patient unable to hike, participate in prior outdoor activities   Time 8   Period Weeks   Target Date 12/07/16  Plan - 10/13/16 1811    Clinical Impression Statement Reviewed patient's current daily HEP and modified items to discourage strengthening flexors and promote stretching flexors.. Educated in standing PWR! exercises with UP and Rock added to her home routine (substituting for her usual squats and stretching shoulders in doorrframe).    Rehab Potential Good   Clinical Impairments Affecting Rehab Potential Enhancers to rehab potential are:  motivation, prior fitness level, engagement of patient.     PT Frequency 2x / week   PT Duration 8 weeks   PT Treatment/Interventions ADLs/Self Care Home Management;Therapeutic activities;Therapeutic exercise;Balance training;Neuromuscular re-education;Gait training;Stair training;Functional mobility training;Patient/family education;Manual techniques   PT Next Visit Plan Review HEP provided 10/13/16 visit; have pt demonstrate her version of hamstring stretch (standing on next to top step with leg extended on top step and holding bil rails!);  work again on standing PWR! Twist and Step, and perhaps modified quadruped PWR!; may likely need UE support; gait training with UE movement   PT Home Exercise Plan sit<>stand, hamstring stretch   Consulted and Agree with Plan of Care Patient      Patient will benefit from skilled therapeutic intervention in order to improve the following deficits and impairments:  Abnormal gait, Decreased balance, Decreased strength, Decreased coordination, Difficulty walking, Postural dysfunction, Decreased mobility (bradykinesia, tremors)  Visit Diagnosis: Other abnormalities of gait and mobility  Muscle weakness (generalized)  Abnormal posture  Other symptoms and signs  involving the nervous system     Problem List Patient Active Problem List   Diagnosis Date Noted  . Long term (current) use of anticoagulants 05/29/2016  . Left wrist pain 01/03/2016  . Left hand pain 01/03/2016  . Vaginal atrophy 10/19/2015  . Tremor, unspecified 10/19/2015  . Osteopenia 10/19/2015  . 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 Closed fracture of part of upper end of humerus 05/01/2015  . Hematuria 02/04/2014  . 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 Cervical pain (neck) 06/20/2010  . Atrial fibrillation ([place]) 05/21/2010  . 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 Menopausal and postmenopausal disorder 02/16/2007  . DEGENERATIVE JOINT DISEASE, RIGHT HIP 02/16/2007    Stephanie Shaw, PT 10/13/2016, 7:07 PM  [facility] [address] [place], [place], [address] Phone: [phone]   Fax:  [phone]  Name: Stephanie Shaw MRN: [id number] Date of Birth: 12-11-1940

## 2016-10-13 NOTE — Patient Instructions (Addendum)
   Put away the ball.  When stretching your hands, make sure your fingers are apart.  Instead of elbow curls, raise elbows behind you and straighten your arms to work your triceps.   Can add back lifting arm straight behind using your posterior shoulder (can do with your weight or tubing).    Add PWR! Up and PWR! Rock (handout provided)

## 2016-10-15 ENCOUNTER — Encounter: Payer: Self-pay | Admitting: Physical Therapy

## 2016-10-15 ENCOUNTER — Ambulatory Visit: Payer: Medicare Other | Admitting: Physical Therapy

## 2016-10-15 DIAGNOSIS — M6281 Muscle weakness (generalized): Secondary | ICD-10-CM

## 2016-10-15 DIAGNOSIS — R29818 Other symptoms and signs involving the nervous system: Secondary | ICD-10-CM

## 2016-10-15 DIAGNOSIS — R2689 Other abnormalities of gait and mobility: Secondary | ICD-10-CM

## 2016-10-15 NOTE — Therapy (Signed)
[facility] [address] [place], [place], [address] Phone: [phone]   Fax:  [phone]  Physical Therapy Treatment  Patient Details  Name: Stephanie Shaw MRN: [id number] Date of Birth: [birth year] Referring Provider: Alonza Bogus, DO  Encounter Date: 10/15/2016      PT End of Session - 10/15/16 0917    Visit Number 4   Number of Visits 16   Date for PT Re-Evaluation 12/07/16   Authorization Type G code every 10th visit   PT Start Time 0802   PT Stop Time 0844   PT Time Calculation (min) 42 min   Activity Tolerance Patient tolerated treatment well   Behavior During Therapy [facility] for tasks assessed/performed      Past Medical History:  Diagnosis Date  . Colles' fracture of right radius 03/05/2015  . DEGENERATIVE JOINT DISEASE, RIGHT HIP 02/16/2007  . Dupuytren's contracture   . Osteoarthritis of hip    right  . Osteoarthritis of left knee   . POSTMENOPAUSAL SYNDROME 02/16/2007  . PREMATURE ATRIAL CONTRACTIONS 02/16/2007  . S/P breast biopsy, left    two o'clock position - benign  . 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 Venous insufficiency     Past Surgical History:  Procedure Laterality Date  . BREAST BIOPSY Left   . CATARACT EXTRACTION, BILATERAL      There were no vitals filed for this visit.      Subjective Assessment - 10/15/16 0806    Subjective Asking how many times a day she should do her PWR exercises.    Pertinent History a-fib, cateract surgery   Patient Stated Goals "I'd like to get my motion back in my left side, try to help tremors."   Currently in Pain? No/denies                         [facility] Adult PT Treatment/Exercise - 10/15/16 0811      Transfers   Five time sit to stand comments  17.53  from chair with arms crossed over chest     Ambulation/Gait   Ambulation/Gait Assistance 5: Supervision;4: Min guard   Ambulation/Gait Assistance Details vc for  improving heelstrike, step length, and arm swing; close guarding when attempted to slow velocity to exaggerate movements with pt having decreased balance   Ambulation Distance (Feet) 480 Feet   Assistive device None   Gait Pattern Decreased arm swing - right;Decreased arm swing - left;Decreased trunk rotation;Narrow base of support;Decreased stride length;Right foot flat;Left foot flat;Right flexed knee in stance;Left flexed knee in stance   Ambulation Surface Level   Pre-Gait Activities anterior-posterior weight shifting with emphasis on heelstike through mid stance with knee extension; at counter for light UE support     Knee/Hip Exercises: Stretches   Active Hamstring Stretch Both;1 rep;30 seconds   Active Hamstring Stretch Limitations pt demonstrated how she stretches at home (standing on 2nd step from the top of flight of steps, places extended leg on the landing, holds bil rails & flexes forward at hips). States she feels best stretch in this manner. Instructed to move her stretch to stand on the floor at the bottom of the steps with foot on the second step. Maintain stance leg in full extension to promote stability (when knee flexes, increased tremors could cause LOB)     Knee/Hip Exercises: Standing   Forward Step Up Both;2 sets;10 reps;Hand Hold: 2;Step Height: 4"   Forward Step Up Limitations emphasizing  heelstrike, hip and knee extension            PWR [facility]) - 10/15/16 0912    PWR! exercises Moves in standing   PWR! Up 10   PWR! Rock 10   PWR! Twist 20   PWR! Step 20   Comments minimal cues for Up, Rock (mostly hand position); moderate cues for Twist, Step and done with pt's back to the mat table for safety in case of LOB; did not add to HEP             PT Education - 10/15/16 0916    Education provided Yes   Education Details rationale for PWR exercises; initiate a walking program 5-10 minutes focusing on heelstrike and armswing and increasing time up to 30 minutes;  changes to stair stretches   Person(s) Educated Patient   Methods Explanation;Demonstration   Comprehension Verbalized understanding;Returned demonstration;Need further instruction          PT Short Term Goals - 10/15/16 0973      PT SHORT TERM GOAL #1   Title The patient will be independent with home exercise program for large amplitude, weight shifting to improve functional mobility.   Time 4   Period Weeks     PT SHORT TERM GOAL #2   Title The patient will be further assessed on 5x sit to stand and LTG to follow if indicated.   Baseline 9/26  17.53 seconds   Time 4   Period Weeks   Status Achieved     PT SHORT TERM GOAL #3   Title The patient will improve FGA to 20/30 to demonstrate improving dynamic gait activities.   Baseline 16/30   Time 4   Period Weeks     PT SHORT TERM GOAL #4   Title The patient will improve backwards walking gait speed to > or equal to 1.8 ft/sec.   Baseline 1.42 ft/sec   Time 4   Period Weeks     PT SHORT TERM GOAL #5   Title The patient will verbalize understanding of community resources for Parkinson's disease (including power over parkinson's and group exercise classes).   Time 4   Period Weeks           PT Long Term Goals - 10/15/16 5329      PT LONG TERM GOAL #1   Title The patinet will be indep with HEP for post d/c progression of activities.   Time 8   Period Weeks     PT LONG TERM GOAL #2   Title 5 time sit<>stand goal to follow, if indicated. 9/26 Goal for <16.0 seconds (cut-off score for increased fall risk in Parkinson patients)   Baseline 9/26 17.53   Time 8   Period Weeks   Status Revised     PT LONG TERM GOAL #3   Title The patient will improve Berg balance test to > or equal to 48/56 to demo dec'd risk for falls.   Baseline Berg=44/56   Time 8   Period Weeks     PT LONG TERM GOAL #4   Title The patient will improve FGA to > or equal to 23/30 to demo improving dynamic gait.   Baseline FGA=16/30   Time 8      PT LONG TERM GOAL #5   Title The patient will demonstrate car transfer from her [facility] to demo safety with functional transfers.   Time 8   Period Weeks     PT LONG TERM  GOAL #6   Title The patient will return to unlevel surface ambulation in order to return to level, wide path trails in order to participate in prior recreational activities.   Baseline patient unable to hike, participate in prior outdoor activities   Time [place] - 10/15/16 0919    Clinical Impression Statement Session focused on gait, balance, and stretching/ROM. Patient progressing well with recent additions to HEP requiring only min cues for technique. Did not add PWR Twist or Step to HEP due to concerns for balance. May try these in sitting or at counter next visit.    Rehab Potential Good   Clinical Impairments Affecting Rehab Potential Enhancers to rehab potential are:  motivation, prior fitness level, engagement of patient.     PT Frequency 2x / week   PT Duration 8 weeks   PT Treatment/Interventions ADLs/Self Care Home Management;Therapeutic activities;Therapeutic exercise;Balance training;Neuromuscular re-education;Gait training;Stair training;Functional mobility training;Patient/family education;Manual techniques   PT Next Visit Plan Review PWR standing (esp Twist and Step)--may need to change to seated or at counter due to balance concerns; gait training with heelstrike, step length, knee/hip extension, UE movement   PT Home Exercise Plan sit<>stand, hamstring stretch; PWR Up & Rock   Consulted and Agree with Plan of Care Patient      Patient will benefit from skilled therapeutic intervention in order to improve the following deficits and impairments:  Abnormal gait, Decreased balance, Decreased strength, Decreased coordination, Difficulty walking, Postural dysfunction, Decreased mobility (bradykinesia, tremors)  Visit Diagnosis: Other abnormalities of gait and  mobility  Muscle weakness (generalized)  Other symptoms and signs involving the nervous system     Problem List Patient Active Problem List   Diagnosis Date Noted  . Long term (current) use of anticoagulants 05/29/2016  . Left wrist pain 01/03/2016  . Left hand pain 01/03/2016  . Vaginal atrophy 10/19/2015  . Tremor, unspecified 10/19/2015  . Osteopenia 10/19/2015  . 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 Closed fracture of part of upper end of humerus 05/01/2015  . Hematuria 02/04/2014  . 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 Cervical pain (neck) 06/20/2010  . Atrial fibrillation ([place]) 05/21/2010  . 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 Menopausal and postmenopausal disorder 02/16/2007  . DEGENERATIVE JOINT DISEASE, RIGHT HIP 02/16/2007    Rexanne Mano, PT 10/15/2016, 9:25 AM  [facility] [address] [place], [place], [address] Phone: [phone]   Fax:  [phone]  Name: SYNDEY JASKOLSKI MRN: [id number] Date of Birth: 1940-08-30

## 2016-10-16 NOTE — Progress Notes (Signed)
HPI:   Stephanie Shaw is a 76 y.o. female, who is here today to follow on some chronic medical problems.  I last saw her on 08/19/16, when she and her daughter were concerned about fatigue,balance, left hand tremor,and coordination issues.  Since her last OV she has followed with neurologists, Dr [name]. She has also undergone further lab work and imaging studies.  Lab Results  Component Value Date   WBC 6.4 08/19/2016   HGB 13.5 08/19/2016   HCT 41.3 08/19/2016   MCV 95.4 08/19/2016   PLT 182.0 08/19/2016   Lab Results  Component Value Date   CREATININE 0.79 08/19/2016   BUN 14 08/19/2016   NA 138 08/19/2016   K 4.3 08/19/2016   CL 101 08/19/2016   CO2 30 08/19/2016    Brain MRI 09/05/16: Normal for her age.  Brain imaging W/SPECT on 10/16/16: Poor uptake of dopamine transport specific radiotracer within the LEFT and RIGHT putamen and decrease relative uptake in the head of the RIGHT caudate nucleus is in pattern typical of Parkinson's syndrome.  Chest CT with contrast 09/24/16 , ordered because persistent CXR abnormality. 1. Minimal right middle lobe scarring. Negative for pulmonary nodule or pneumonia. 2. Multinodular goiter, the largest nodule measuring 3.7 cm and displacing the esophagus at the thoracic inlet. 3. Aortic Atherosclerosis (ICD10-I70.0).  Coronary atherosclerosis.   Thyroid US on 10/03/14: Multinodular, enlarged thyroid.   Two right-sided nodules, (labeled 3 and 4) favored to be benign related to thyroid goiter, do meet criteria for ultrasound-guided biopsy, however, have decreased size from the comparison ultrasound studies. Either continued surveillance or biopsy may be reasonable. Remainder of nodules do not meet criteria for surveillance or biopsy.  Last F/U with Dr [name] on 08/26/16. She has an appt 11/26/16.  Concerns today:   She is concerned because she has not been prescribed medication for tremor and left-sided coordination  issues/weakness. She just started PT, 2 time per week, and OT was also recommend. She is not using a cane or [place]. She is still driving.  States that symptoms are "somewhat worse, not any better"  She denies depression but feels "discouraged."  + Fatigue. She denies insomnia, states that she is sleeping "pretty well." Taking naps during the day help with fatigue but does sometimes interfere with sleep at night.  She has not had a fall since her last OV.  Thyroid:  She also would like to discuss recent thyroid US results. 10/02/16 Thyroid US: Multinodular, enlarged thyroid.Two right-sided nodules, (labeled 3 and 4) favored to be benign related to thyroid goiter, do meet criteria for ultrasound-guided biopsy, however, have decreased size from the comparison ultrasound studies. Either continued surveillance or biopsy may be reasonable.   According to patient, her daughter also was wondering  if her tremor is caused by thyroid problems.  Lab Results  Component Value Date   TSH 1.00 08/19/2016   She denies abnormal weight loss, diarrhea, heat/cold intolerance. History of atrial fibrillation, she is following with cardiologist and taking Xorelto.   -Sometimes she feels "little asthma", she point mid of her lower chest. States that occasionally she feels some congestion, alleviated by coughing. She has not identified exacerbating factors. Dysphagia , states that sometimes liquid and food goes to her "pipe" and causes cough. She feels it mainly on left side.  Denies abdominal pain, nausea, vomiting, changes in bowel habits, blood in stool or melena. She has not noted heartburn or acid reflux.  Dysphagia is stable,  mainly in the morning when she eats milk and cereal. It does not happen is she drinks fluids alone or most solids. She has not noted heartburn.  Denies abdominal pain, nausea, vomiting, changes in bowel habits, blood in stool or melena.   Review of Systems    Constitutional: Positive for activity change and fatigue. Negative for appetite change, chills, fever and unexpected weight change.  HENT: Positive for trouble swallowing. Negative for mouth sores, nosebleeds, sore throat and voice change.   Eyes: Negative for redness and visual disturbance.  Respiratory: Positive for cough. Negative for shortness of breath and wheezing.   Cardiovascular: Negative for chest pain, palpitations and leg swelling.  Gastrointestinal: Negative for abdominal pain, nausea and vomiting.       Negative for changes in bowel habits.  Endocrine: Negative for cold intolerance and heat intolerance.  Genitourinary: Negative for decreased urine volume, dysuria and hematuria.  Musculoskeletal: Positive for gait problem. Negative for myalgias.  Skin: Negative for rash and wound.  Neurological: Positive for tremors and weakness. Negative for seizures, syncope and headaches.  Psychiatric/Behavioral: Negative for confusion and sleep disturbance. The patient is nervous/anxious.       Current Outpatient Prescriptions on File Prior to Visit  Medication Sig Dispense Refill  . cholecalciferol (VITAMIN D) 400 units TABS tablet Take 400 Units by mouth daily.    Marland Kitchen diltiazem (CARDIZEM CD) 180 MG 24 hr capsule TAKE ONE CAPSULE EVERY DAY 90 capsule 3  . estradiol (ESTRACE VAGINAL) 0.1 MG/GM vaginal cream INSERT 1/4 OF APPLICATORFUL VAGINALLY TWICE PER WEEK 42.5 g 3  . ferrous sulfate 325 (65 FE) MG EC tablet Take 325 mg by mouth 2 (two) times a week.     . Flaxseed, Linseed, (FLAX SEED OIL) 1000 MG CAPS Take 2 capsules by mouth 2 (two) times daily.     Alveda Reasons 20 MG TABS tablet TAKE 1 TABLET BY MOUTH ONCE DAILY 90 tablet 3   No current facility-administered medications on file prior to visit.      Past Medical History:  Diagnosis Date  . Colles' fracture of right radius 03/05/2015  . DEGENERATIVE JOINT DISEASE, RIGHT HIP 02/16/2007  . Dupuytren's contracture   . Osteoarthritis  of hip    right  . Osteoarthritis of left knee   . POSTMENOPAUSAL SYNDROME 02/16/2007  . PREMATURE ATRIAL CONTRACTIONS 02/16/2007  . S/P breast biopsy, left    two o'clock position - benign  . 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 Venous insufficiency    Allergies  Allergen Reactions  . Doxycycline Nausea And Vomiting  . Sulfonamide Derivatives     REACTION: HIVES    Social History   Social History  . Marital status: Divorced    Spouse name: N/A  . Number of children: N/A  . Years of education: N/A   Occupational History  . 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 Smoking status: Former Smoker    Quit date: 08/27/1971  . Smokeless tobacco: Never Used  . Alcohol use No     Comment: hardly ever  . Drug use: No  . Sexual activity: Not Asked   Other Topics Concern  . None   Social History Narrative  . None    Vitals:   10/17/16 0818  BP: 116/80  Pulse: 68  Resp: 12  SpO2: 97%   Body mass index is 20.83 kg/m.  Wt Readings from Last 3 Encounters:  10/17/16 137 lb (62.1 kg)  08/26/16 134 lb (60.8 kg)  08/19/16 136 lb (61.7 kg)     Physical Exam  Nursing note and vitals reviewed. Constitutional: She is oriented to person, place, and time. She appears well-developed. No distress.  HENT:  Head: Normocephalic and atraumatic.  Mouth/Throat: Oropharynx is clear and moist and mucous membranes are normal.  Eyes: Pupils are equal, round, and reactive to light. Conjunctivae are normal.  Neck: No tracheal deviation present. Thyromegaly (Right > left) present. No thyroid mass present.  Cardiovascular: Normal rate.  An irregular rhythm present.  No murmur heard. DP pulses present bilateral.  Respiratory: Effort normal and breath sounds normal. No respiratory distress.  GI: Soft. She exhibits no mass. There is no hepatomegaly. There is no tenderness.  Musculoskeletal: She exhibits no edema or tenderness.  Lymphadenopathy:    She has no cervical  adenopathy.  Neurological: She is alert and oriented to person, place, and time. She has normal strength. She displays tremor. Coordination and gait abnormal.  Mildly unstable gait, better after a few steps. Difficulty getting up from chair. Slow gait, not assisted.   Skin: Skin is warm. No erythema.  Psychiatric: Her mood appears anxious.  Fairly groomed, good eye contact.    ASSESSMENT AND PLAN:   Stephanie Shaw was seen today for follow-up.  Diagnoses and all orders for this visit:  Fatigue, unspecified type  Stable. We discussed possible etiologies. Recommended taking a nap around 12 noon or 1 pm, not later.  Dysphagia, unspecified type  Differential Dx's discussed, ? GERD,parkinsonisim related among some. Speech therapy evaluation will be arrange. Broncho aspiration precautions discussed.  -     Ambulatory referral to Speech Therapy  Nontoxic multinodular goiter  Dx discussed, thyroid US reviewed. She would like to continue annual follow up with imaging.  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 Recommend following with Dr [name] in regard to pharmacologic treatment options. Fall precautions. Continue PT.  Need for influenza vaccination -     Flu vaccine HIGH DOSE PF     -Ms. [name] was advised to return sooner than planned today if new concerns arise.       [name]. [name], MD  [facility] [place] office.

## 2016-10-17 ENCOUNTER — Ambulatory Visit (INDEPENDENT_AMBULATORY_CARE_PROVIDER_SITE_OTHER): Payer: Medicare Other | Admitting: Family Medicine

## 2016-10-17 ENCOUNTER — Encounter: Payer: Self-pay | Admitting: Family Medicine

## 2016-10-17 ENCOUNTER — Encounter: Payer: Self-pay | Admitting: Neurology

## 2016-10-17 VITALS — BP 116/80 | HR 68 | Resp 12 | Ht 68.0 in | Wt 137.0 lb

## 2016-10-17 DIAGNOSIS — G20C Parkinsonism, unspecified: Secondary | ICD-10-CM | POA: Insufficient documentation

## 2016-10-17 DIAGNOSIS — R1310 Dysphagia, unspecified: Secondary | ICD-10-CM | POA: Diagnosis not present

## 2016-10-17 DIAGNOSIS — E042 Nontoxic multinodular goiter: Secondary | ICD-10-CM | POA: Diagnosis not present

## 2016-10-17 DIAGNOSIS — R5383 Other fatigue: Secondary | ICD-10-CM | POA: Diagnosis not present

## 2016-10-17 DIAGNOSIS — G20 Parkinson's disease: Secondary | ICD-10-CM

## 2016-10-17 DIAGNOSIS — Z23 Encounter for immunization: Secondary | ICD-10-CM

## 2016-10-17 NOTE — Patient Instructions (Addendum)
A few things to remember from today's visit:   Dysphagia, unspecified type  Need for influenza vaccination - Plan: Flu vaccine HIGH DOSE PF  Parkinsonism, unspecified Parkinsonism type ([facility])  Fatigue, unspecified type  Nontoxic multinodular goiter  Caution with swallowing food. Will try to arrange speech therapy.  Fall precaution.  Take naps around 1 pm to 2 pm, no later because you may not be able to sleep.  Lab Results  Component Value Date   TSH 1.00 08/19/2016     Please be sure medication list is accurate. If a new problem present, please set up appointment sooner than planned today.

## 2016-10-20 ENCOUNTER — Ambulatory Visit: Payer: Medicare Other | Attending: Neurology | Admitting: Physical Therapy

## 2016-10-20 DIAGNOSIS — M6281 Muscle weakness (generalized): Secondary | ICD-10-CM | POA: Insufficient documentation

## 2016-10-20 DIAGNOSIS — R2689 Other abnormalities of gait and mobility: Secondary | ICD-10-CM | POA: Insufficient documentation

## 2016-10-20 DIAGNOSIS — R251 Tremor, unspecified: Secondary | ICD-10-CM | POA: Diagnosis present

## 2016-10-20 DIAGNOSIS — R29898 Other symptoms and signs involving the musculoskeletal system: Secondary | ICD-10-CM | POA: Insufficient documentation

## 2016-10-20 DIAGNOSIS — R2681 Unsteadiness on feet: Secondary | ICD-10-CM | POA: Insufficient documentation

## 2016-10-20 DIAGNOSIS — R29818 Other symptoms and signs involving the nervous system: Secondary | ICD-10-CM | POA: Diagnosis present

## 2016-10-20 DIAGNOSIS — R293 Abnormal posture: Secondary | ICD-10-CM

## 2016-10-20 DIAGNOSIS — R278 Other lack of coordination: Secondary | ICD-10-CM | POA: Diagnosis present

## 2016-10-20 NOTE — Therapy (Signed)
[facility] [address] [place], [place], [address] Phone: [phone]   Fax:  [phone]  Physical Therapy Treatment  Patient Details  Name: Stephanie Shaw MRN: [id number] Date of Birth: Sep 21, 1940 Referring Provider: Alonza Bogus, DO  Encounter Date: 10/20/2016      PT End of Session - 10/20/16 1213    Visit Number 5   Number of Visits 16   Date for PT Re-Evaluation 12/07/16   Authorization Type G code every 10th visit   PT Start Time 0804   PT Stop Time 0844   PT Time Calculation (min) 40 min   Activity Tolerance Patient tolerated treatment well   Behavior During Therapy [facility] for tasks assessed/performed      Past Medical History:  Diagnosis Date  . Colles' fracture of right radius 03/05/2015  . DEGENERATIVE JOINT DISEASE, RIGHT HIP 02/16/2007  . Dupuytren's contracture   . Osteoarthritis of hip    right  . Osteoarthritis of left knee   . POSTMENOPAUSAL SYNDROME 02/16/2007  . PREMATURE ATRIAL CONTRACTIONS 02/16/2007  . S/P breast biopsy, left    two o'clock position - benign  . 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 Venous insufficiency     Past Surgical History:  Procedure Laterality Date  . BREAST BIOPSY Left   . CATARACT EXTRACTION, BILATERAL      There were no vitals filed for this visit.      Subjective Assessment - 10/20/16 0808    Subjective Reports doing exercises and walking 10-15 minutes over the weekend.   Pertinent History a-fib, cateract surgery   Patient Stated Goals "I'd like to get my motion back in my left side, try to help tremors."   Currently in Pain? No/denies                         [facility] Adult PT Treatment/Exercise - 10/20/16 0001      Ambulation/Gait   Ambulation/Gait Yes   Ambulation/Gait Assistance 5: Supervision;4: Min guard   Ambulation/Gait Assistance Details Cues for increased step length, upright posture and arm swing (pt does not get  terminal knee extension with stance phase, even with cueing)   Ambulation Distance (Feet) 345 Feet  then additional gait-see below   Assistive device None   Gait Pattern Decreased arm swing - right;Decreased arm swing - left;Decreased trunk rotation;Narrow base of support;Decreased stride length;Right foot flat;Left foot flat;Right flexed knee in stance;Left flexed knee in stance   Ambulation Surface Level;Indoor   Pre-Gait Activities Gait training with attempted resisted walking, in attempt to promote more upright posture and increased step length, terminal knee extension in stance phase.  Pt slows noteably and has several LOB, with tech/PT assisting to regain balance.  Additional 230 ft of gait with cues for upright posture, increased arm swing and increased step length.  At end of session, pt has increased crouched gait pattern with bilateral flexed knees.     Self-Care   Self-Care Other Self-Care Comments   Other Self-Care Comments  Provided patient with information on and discussed Power over Parkinson's community education group, including how she may benefit from this Parkinson's disease resource in the community.           PWR [facility]) - 10/20/16 6962    PWR! exercises Moves in standing   PWR! Up x 20   PWR! Rock x 20    PWR! Twist x 20   PWR Step  x 20 ,  then 20 at chair, with cues for increased deliberate step and foot clearance   Comments Pt needs cues for increased foot clearance with stepping exercise, improved with UE support at chair          Balance Exercises - 10/20/16 0820      Balance Exercises: Standing   Stepping Strategy Anterior;Posterior;10 reps;UE support  alternating legs, at counter   Other Standing Exercises STagger stance weightshifting forward and back 15 reps, then forward>backward step and weightshift x 15 reps each leg, with UE support.  Pt needs verbal and tactile cues, extra time and instruction for terminal knee extension upon forward  weightshifting.  Trying to promote upright standing upon fwd weightshift           PT Education - 10/20/16 1213    Education provided Yes   Education Details Provided information on and discussed Power over [employer] community education group   Person(s) Educated Patient   Methods Explanation;Handout   Comprehension Verbalized understanding          PT Short Term Goals - 10/15/16 0923      PT SHORT TERM GOAL #1   Title The patient will be independent with home exercise program for large amplitude, weight shifting to improve functional mobility.   Time 4   Period Weeks     PT SHORT TERM GOAL #2   Title The patient will be further assessed on 5x sit to stand and LTG to follow if indicated.   Baseline 9/26  17.53 seconds   Time 4   Period Weeks   Status Achieved     PT SHORT TERM GOAL #3   Title The patient will improve FGA to 20/30 to demonstrate improving dynamic gait activities.   Baseline 16/30   Time 4   Period Weeks     PT SHORT TERM GOAL #4   Title The patient will improve backwards walking gait speed to > or equal to 1.8 ft/sec.   Baseline 1.42 ft/sec   Time 4   Period Weeks     PT SHORT TERM GOAL #5   Title The patient will verbalize understanding of community resources for Parkinson's disease (including power over parkinson's and group exercise classes).   Time 4   Period Weeks           PT Long Term Goals - 10/15/16 4627      PT LONG TERM GOAL #1   Title The patinet will be indep with HEP for post d/c progression of activities.   Time 8   Period Weeks     PT LONG TERM GOAL #2   Title 5 time sit<>stand goal to follow, if indicated. 9/26 Goal for <16.0 seconds (cut-off score for increased fall risk in Parkinson patients)   Baseline 9/26 17.53   Time 8   Period Weeks   Status Revised     PT LONG TERM GOAL #3   Title The patient will improve Berg balance test to > or equal to 48/56 to demo dec'd risk for falls.   Baseline Berg=44/56   Time  8   Period Weeks     PT LONG TERM GOAL #4   Title The patient will improve FGA to > or equal to 23/30 to demo improving dynamic gait.   Baseline FGA=16/30   Time 8     PT LONG TERM GOAL #5   Title The patient will demonstrate car transfer from her [facility] to demo safety with functional transfers.  Time 8   Period Weeks     PT LONG TERM GOAL #6   Title The patient will return to unlevel surface ambulation in order to return to level, wide path trails in order to participate in prior recreational activities.   Baseline patient unable to hike, participate in prior outdoor activities   Time 8   Period Weeks               Plan - 10/20/16 1214    Clinical Impression Statement Pt performed standing PWR! Twist and PWR! Step without balance difficulty, but continues to require verbal cues for increased amplitude/deliberate movement patterns and for increased step clearance.  Pt is better able to focus on foot clearance with UEs supported.  Continues to need work on terminal knee extension with stance phase and with forward weight excursion, to help with increased step length and upright posture with gait.   Rehab Potential Good   Clinical Impairments Affecting Rehab Potential Enhancers to rehab potential are:  motivation, prior fitness level, engagement of patient.     PT Frequency 2x / week   PT Duration 8 weeks   PT Treatment/Interventions ADLs/Self Care Home Management;Therapeutic activities;Therapeutic exercise;Balance training;Neuromuscular re-education;Gait training;Stair training;Functional mobility training;Patient/family education;Manual techniques   PT Next Visit Plan Continue to work on [employer] standing (esp Twist and Step)--with UEs supported and add to HEP;gait training with heelstrike, step length, knee/hip extension, UE coordinated movement   PT Home Exercise Plan sit<>stand, hamstring stretch; PWR Up & Rock   Consulted and Agree with Plan of Care Patient      Patient  will benefit from skilled therapeutic intervention in order to improve the following deficits and impairments:  Abnormal gait, Decreased balance, Decreased strength, Decreased coordination, Difficulty walking, Postural dysfunction, Decreased mobility (bradykinesia, tremors)  Visit Diagnosis: Other abnormalities of gait and mobility  Other symptoms and signs involving the nervous system  Abnormal posture     Problem List Patient Active Problem List   Diagnosis Date Noted  . Parkinsonism ([place]) 10/17/2016  . Fatigue 10/17/2016  . Long term (current) use of anticoagulants 05/29/2016  . Left wrist pain 01/03/2016  . Left hand pain 01/03/2016  . Vaginal atrophy 10/19/2015  . Tremor, unspecified 10/19/2015  . Osteopenia 10/19/2015  . 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 Closed fracture of part of upper end of humerus 05/01/2015  . Hematuria 02/04/2014  . 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 Cervical pain (neck) 06/20/2010  . Atrial fibrillation ([place]) 05/21/2010  . 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 Menopausal and postmenopausal disorder 02/16/2007  . DEGENERATIVE JOINT DISEASE, RIGHT HIP 02/16/2007    Frazier Butt. 10/20/2016, 12:19 PM  Frazier Butt., PT   [facility] [address] [place], [place], [address] Phone: [phone]   Fax:  [phone]  Name: Stephanie Shaw MRN: [id number] Date of Birth: 07-04-1940

## 2016-10-23 ENCOUNTER — Ambulatory Visit: Payer: Medicare Other | Admitting: Physical Therapy

## 2016-10-23 DIAGNOSIS — R293 Abnormal posture: Secondary | ICD-10-CM

## 2016-10-23 DIAGNOSIS — R2689 Other abnormalities of gait and mobility: Secondary | ICD-10-CM

## 2016-10-23 DIAGNOSIS — R29818 Other symptoms and signs involving the nervous system: Secondary | ICD-10-CM

## 2016-10-23 NOTE — Patient Instructions (Addendum)
"  I love a Database administrator    USE MARCHING IN PLACE AS A WAY TO IMPROVE YOUR TURNS.  PRACTICE TURNING TO RIGHT AND TO LEFT, BY MARCHING IN PLACE TO LIFT EACH FOOT, IN ORDER TO CHANGE DIRECTIONS. Repeat __3__ times each direction. Do __1-2__ sessions per day.  http://gt2.exer.us/345   Copyright  VHI. All rights reserved.

## 2016-10-23 NOTE — Therapy (Deleted)
[facility] [address] [place], [place], [address] Phone: [phone]   Fax:  [phone]  Patient Details  Name: SHAKIYA MCNEARY MRN: [id number] Date of Birth: 12-30-1940 Referring Provider:  [name], [name], MD  Encounter Date: 10/23/2016   Frazier Butt. 10/23/2016, 12:31 PM  [facility] [address] [place], [place], [address] Phone: [phone]   Fax:  [phone]

## 2016-10-23 NOTE — Therapy (Signed)
[facility] [address] [place], [place], [address] Phone: [phone]   Fax:  [phone]  Physical Therapy Treatment  Patient Details  Name: Stephanie Shaw MRN: [id number] Date of Birth: 1941/01/18 Referring Provider: Alonza Bogus, DO  Encounter Date: 10/23/2016      PT End of Session - 10/23/16 1224    Visit Number 6   Number of Visits 16   Date for PT Re-Evaluation 12/07/16   Authorization Type G code every 10th visit   PT Start Time 0848   PT Stop Time 0929   PT Time Calculation (min) 41 min   Activity Tolerance Patient tolerated treatment well   Behavior During Therapy [facility] for tasks assessed/performed      Past Medical History:  Diagnosis Date  . Colles' fracture of right radius 03/05/2015  . DEGENERATIVE JOINT DISEASE, RIGHT HIP 02/16/2007  . Dupuytren's contracture   . Osteoarthritis of hip    right  . Osteoarthritis of left knee   . POSTMENOPAUSAL SYNDROME 02/16/2007  . PREMATURE ATRIAL CONTRACTIONS 02/16/2007  . S/P breast biopsy, left    two o'clock position - benign  . 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 Venous insufficiency     Past Surgical History:  Procedure Laterality Date  . BREAST BIOPSY Left   . CATARACT EXTRACTION, BILATERAL      There were no vitals filed for this visit.      Subjective Assessment - 10/23/16 0850    Subjective No changes since last visit.  My left leg just feels so heavy.   Pertinent History a-fib, cateract surgery   Patient Stated Goals "I'd like to get my motion back in my left side, try to help tremors."   Currently in Pain? No/denies                            PWR [facility]) - 10/23/16 5427    PWR! exercises Moves in standing   PWR! Up x 20    PWR! Rock x 20   PWR! Twist x 20   PWR Step x 20 reps with UE support to side   Comments Additional practice with side step and weigthshift using 2" block for increased height and step  length.  Pt is performing these at home, as above, needing cues for UE support at counter.          Balance Exercises - 10/23/16 0907      Balance Exercises: Standing   Stepping Strategy Anterior;Posterior;10 reps;UE support  2 sets each, at counter; forward use of 2" block, step over   Retro Gait Upper extremity support;5 reps  At counter, forward/back gait   Sidestepping Upper extremity support;5 reps  At counter, each direction   Turning Right;Left;3 reps  Marching in place, weight-shift, and quarter turns   Marching Limitations Marching in place for turning to R and to L   Other Standing Exercises Forward/back step and weigthshift x 15 reps           PT Education - 10/23/16 1223    Education provided Yes   Education Details Marching in place technique for improved turns in home   Person(s) Educated Patient   Methods Explanation;Demonstration;Handout;Verbal cues   Comprehension Verbalized understanding;Returned demonstration;Verbal cues required;Need further instruction          PT Short Term Goals - 10/15/16 0923      PT SHORT TERM GOAL #1   Title  The patient will be independent with home exercise program for large amplitude, weight shifting to improve functional mobility.   Time 4   Period Weeks     PT SHORT TERM GOAL #2   Title The patient will be further assessed on 5x sit to stand and LTG to follow if indicated.   Baseline 9/26  17.53 seconds   Time 4   Period Weeks   Status Achieved     PT SHORT TERM GOAL #3   Title The patient will improve FGA to 20/30 to demonstrate improving dynamic gait activities.   Baseline 16/30   Time 4   Period Weeks     PT SHORT TERM GOAL #4   Title The patient will improve backwards walking gait speed to > or equal to 1.8 ft/sec.   Baseline 1.42 ft/sec   Time 4   Period Weeks     PT SHORT TERM GOAL #5   Title The patient will verbalize understanding of community resources for Parkinson's disease (including power over  parkinson's and group exercise classes).   Time 4   Period Weeks           PT Long Term Goals - 10/15/16 5093      PT LONG TERM GOAL #1   Title The patinet will be indep with HEP for post d/c progression of activities.   Time 8   Period Weeks     PT LONG TERM GOAL #2   Title 5 time sit<>stand goal to follow, if indicated. 9/26 Goal for <16.0 seconds (cut-off score for increased fall risk in Parkinson patients)   Baseline 9/26 17.53   Time 8   Period Weeks   Status Revised     PT LONG TERM GOAL #3   Title The patient will improve Berg balance test to > or equal to 48/56 to demo dec'd risk for falls.   Baseline Berg=44/56   Time 8   Period Weeks     PT LONG TERM GOAL #4   Title The patient will improve FGA to > or equal to 23/30 to demo improving dynamic gait.   Baseline FGA=16/30   Time 8     PT LONG TERM GOAL #5   Title The patient will demonstrate car transfer from her [facility] to demo safety with functional transfers.   Time 8   Period Weeks     PT LONG TERM GOAL #6   Title The patient will return to unlevel surface ambulation in order to return to level, wide path trails in order to participate in prior recreational activities.   Baseline patient unable to hike, participate in prior outdoor activities   Time 8   Period Weeks               Plan - 10/23/16 1225    Clinical Impression Statement Addressed mostly standing activities today with PWR! MOves and with work on amplitude/intensity of step pattern on LLE.  Pt overall seems steadier and has more awareness of large movement pattern on LLE, but reports she isn't sure she notices changes at home.  Pt asks about her UE and feels LUE is doing less and less with activities at home, such as cooking.  Reminded patient of OT order placed by Dr. Carles Collet, and pt would like to pursue at this time.   Rehab Potential Good   Clinical Impairments Affecting Rehab Potential Enhancers to rehab potential are:  motivation,  prior fitness level, engagement of patient.  PT Frequency 2x / week   PT Duration 8 weeks   PT Treatment/Interventions ADLs/Self Care Home Management;Therapeutic activities;Therapeutic exercise;Balance training;Neuromuscular re-education;Gait training;Stair training;Functional mobility training;Patient/family education;Manual techniques   PT Next Visit Plan Work on gait training with heelstrike, step length, UE coordinated movement with exercises, work on terminal knee extension and posture-Check STGs next week   PT [place] sit<>stand, hamstring stretch; [place] (pt is performing PWR! Twist and step in standing-educated to have UE support for stepping)   Consulted and Agree with Plan of Care Patient      Patient will benefit from skilled therapeutic intervention in order to improve the following deficits and impairments:  Abnormal gait, Decreased balance, Decreased strength, Decreased coordination, Difficulty walking, Postural dysfunction, Decreased mobility (bradykinesia, tremors)  Visit Diagnosis: Other abnormalities of gait and mobility  Other symptoms and signs involving the nervous system  Abnormal posture     Problem List Patient Active Problem List   Diagnosis Date Noted  . Parkinsonism ([place]) 10/17/2016  . Fatigue 10/17/2016  . Long term (current) use of anticoagulants 05/29/2016  . Left wrist pain 01/03/2016  . Left hand pain 01/03/2016  . Vaginal atrophy 10/19/2015  . Tremor, unspecified 10/19/2015  . Osteopenia 10/19/2015  . 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 Closed fracture of part of upper end of humerus 05/01/2015  . Hematuria 02/04/2014  . 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 Cervical pain (neck) 06/20/2010  . Atrial fibrillation ([place]) 05/21/2010  . 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 Menopausal and postmenopausal disorder 02/16/2007  . DEGENERATIVE JOINT DISEASE, RIGHT HIP 02/16/2007    Frazier Butt. 10/23/2016, 12:36 PM  Frazier Butt., PT   [facility] [address] [place], [place], [address] Phone: [phone]   Fax:  [phone]  Name: Stephanie Shaw MRN: [id number] Date of Birth: 04-Jan-1941

## 2016-10-29 ENCOUNTER — Ambulatory Visit: Payer: Medicare Other | Admitting: Occupational Therapy

## 2016-10-29 ENCOUNTER — Ambulatory Visit: Payer: Medicare Other | Admitting: Physical Therapy

## 2016-10-29 DIAGNOSIS — R293 Abnormal posture: Secondary | ICD-10-CM

## 2016-10-29 DIAGNOSIS — R2681 Unsteadiness on feet: Secondary | ICD-10-CM

## 2016-10-29 DIAGNOSIS — R29898 Other symptoms and signs involving the musculoskeletal system: Secondary | ICD-10-CM

## 2016-10-29 DIAGNOSIS — M6281 Muscle weakness (generalized): Secondary | ICD-10-CM

## 2016-10-29 DIAGNOSIS — R251 Tremor, unspecified: Secondary | ICD-10-CM

## 2016-10-29 DIAGNOSIS — R29818 Other symptoms and signs involving the nervous system: Secondary | ICD-10-CM

## 2016-10-29 DIAGNOSIS — R2689 Other abnormalities of gait and mobility: Secondary | ICD-10-CM

## 2016-10-29 DIAGNOSIS — R278 Other lack of coordination: Secondary | ICD-10-CM

## 2016-10-29 NOTE — Therapy (Addendum)
[place] [address] [place], [place], [address] Phone: [phone]   Fax:  [phone]  Occupational Therapy Evaluation  Patient Details  Name: Stephanie Shaw MRN: [id number] Date of Birth: 09-25-1940 Referring Provider: Dr. Wells Guiles [name]   Encounter Date: 10/29/2016      OT End of Session - 10/29/16 1944    Visit Number 1   Number of Visits 17   Date for OT Re-Evaluation 12/28/16   Authorization Type [employer] Medicare, no visit limit, no auth needed.  G-code needed   Authorization - Visit Number 1   Authorization - Number of Visits 10   OT Start Time 0802   OT Stop Time 0848   OT Time Calculation (min) 46 min   Activity Tolerance Patient tolerated treatment well   Behavior During Therapy WFL for tasks assessed/performed      Past Medical History:  Diagnosis Date  . Colles' fracture of right radius 03/05/2015  . DEGENERATIVE JOINT DISEASE, RIGHT HIP 02/16/2007  . Dupuytren's contracture   . Osteoarthritis of hip    right  . Osteoarthritis of left knee   . POSTMENOPAUSAL SYNDROME 02/16/2007  . PREMATURE ATRIAL CONTRACTIONS 02/16/2007  . S/P breast biopsy, left    two o'clock position - benign  . 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 Venous insufficiency     Past Surgical History:  Procedure Laterality Date  . BREAST BIOPSY Left   . CATARACT EXTRACTION, BILATERAL      There were no vitals filed for this visit.      Subjective Assessment - 10/29/16 0807    Subjective  Pt reports that although she isn't diagnosed with Parkinson's disease yet, she belives that she has PD, but will talk to Dr. Carles Collet further at next follow-up.   Pertinent History possible Parkinsonism, hx of multiple falls (fx with 2 falls), degenerative joint disease R hip, a-fib, nontoxic multinodular goiter, hx of L humerus fx 2017, osteopenia, R wrist fx 2017   Limitations fall risk   Patient Stated Goals improve control and strength of  LUE   Currently in Pain? No/denies           [facility] OT Assessment - 10/29/16 0001      Assessment   Diagnosis Parkinsonism, extremity weakness   Referring Provider Dr. Wells Guiles [name]    Onset Date --  over approx 1 year ago, gradual onset   Prior Therapy previous therapy for L humerus fx, current with neuro PT      Precautions   Precautions Fall   Precaution Comments h/o serious falls with fracture     Balance Screen   Has the patient fallen in the past 6 months Yes   How many times? 1  + hx of 2 serious falls with fx      Home  Environment   Family/patient expects to be discharged to: Private residence   [place] Two level   Alternate Level Stairs - Number of Steps 15   Lives With Alone  has help for heavier cleaning, development does yard work     Prior Function   Level of Independence Independent     ADL   Eating/Feeding --  difficulty scooping food, uses spoon more   Eating/Feeding Patient Percentage --  holding spoon tight helps tremor   Grooming --  difficulty washing hair and flossing with L  hand   Upper Body Bathing Modified independent  lets shower do back   Lower Body Bathing Modified independent  difficulty with balance with cleaning feet   Upper Body Dressing Increased time  incr time for buttons   Lower Body Dressing --  incr time for tying, standing to don pants/leans against bed   Toilet Transfer --  handicapped height, needs to rock   Toileting - Clothing Manipulation Modified independent   Toileting -  Museum/gallery conservator --  uses towel bar--cautioned against this     IADL   Prior Level of Function Shopping incr time, fatigues quickly   Prior Level of Function Light Housekeeping difficulty with carrying laundry basket, difficulty with high reaching, bending, turning--incr time, has to be  careful   Light Housekeeping --  has assist for heavier cleaning   Prior Level of Function Meal Prep difficulty using L hand (difficulty seperating eggs, cutting butter, difficulty carrying pots/pans)  back gets tired when in the [employer] Drives own vehicle  difficulty getting out of low car seat   Medication Management Is responsible for taking medication in correct dosages at correct time   Prior Level of Function Financial Management doubles letter when typing   Financial Management Manages financial matters independently (budgets, writes checks, pays rent, bills goes to bank), collects and keeps track of income     Mobility   Mobility Status History of falls;Independent  noted walking on toes, small step size     Written Expression   Dominant Hand Right   Handwriting Increased time;Severe micrographia;100% legible     Vision - History   Baseline Vision Wears glasses only for reading     Vision Assessment   Tracking/Visual Pursuits --  WFL     Cognition   Overall Cognitive Status Within Functional Limits for tasks assessed  pt denies changes     Observation/Other Assessments   Observations rounded shoulders   Standing Functional Reach Test R-8", L-8.5"   Other Surveys  Select   Physical Performance Test   Yes   Simulated Eating Time (seconds) 13.41sec   Simulated Eating Comments holds spoon at end   Donning Doffing Jacket Time (seconds) 24.31  decr trunk rotation   Donning Doffing Jacket Comments Fastening/unfastening 3 buttons in 45.60 sec with bradykinesia noted with hand movements     Coordination   9 Hole Peg Test Right;Left   Right 9 Hole Peg Test 26.10   Left 9 Hole Peg Test 34.06   Tremors appear to be more at rest      Tone   Assessment Location Right Upper Extremity;Left Upper Extremity     AROM   Overall AROM  Deficits  90-95% L shoulder, decr supination, cues for elbow ext      Strength   Overall Strength Deficits;Other  (comment)  L shoulder grossly 4-/5, R shoulder 5/5, BUE elbow 5/5           Hand Function   Right Hand Grip (lbs) 35   Left Hand Grip (lbs) 20     RUE Tone   RUE Tone Within Functional Limits     LUE Tone   LUE Tone Mild                  OT Education - 10/29/16 2037    Education provided Yes   Education Details Recommended sitting for LB dressing and actual grab bar in  shower due to risk for fall.  Discussed POC/OT eval results   Person(s) Educated Patient   Methods Explanation   Comprehension Verbalized understanding          OT Short Term Goals - 10/29/16 2007      OT SHORT TERM GOAL #1   Title Pt will be independent with HEP.--check STGs 11/28/16   Time 4   Period Weeks   Status New     OT SHORT TERM GOAL #2   Title Pt will demo large amplitude movement strategies during transitional movements (bending, reaching, carrying objects, turning) with only occasional min v.c.   Time 4   Period Weeks   Status New     OT SHORT TERM GOAL #3   Title Pt will report improved ability to wash hair with LUE.   Time 4   Period Weeks   Status New     OT SHORT TERM GOAL #4   Title Pt will be able to write at least 3 sentences with only mild micrographia.   Time 4   Period Weeks   Status New     OT SHORT TERM GOAL #5   Title Pt will verbalize understanding of ways to prevent future complications and appropriate community resources.   Time 4   Period Weeks   Status New     Additional Short Term Goals   Additional Short Term Goals Yes     OT SHORT TERM GOAL #6   Title Assess box and blocks test for functional reaching/coordination and set goal as appropriate   Time 4   Period Weeks   Status New           OT Long Term Goals - 10/29/16 2016      OT LONG TERM GOAL #1   Title Pt will verbalize understanding of adative strategies/AE to incr safety/ease with ADLs/IADLs.--check LTGs 12/28/16   Time 8   Period Weeks   Status New     OT LONG TERM GOAL #2    Title Pt will improve L hand coordination for ADLs as shown by improving time on 9-hole peg test by at least 5sec.   Baseline 34.06sec   Time 8   Period Weeks   Status New     OT LONG TERM GOAL #3   Title Pt will improve balance for IADLs as shown by improving standing functional reach to at least 10" bilaterally.   Baseline R-8', L-8.5"   Time 8   Period Weeks   Status New     OT LONG TERM GOAL #4   Title Pt will improve L grip strength by least 5lbs to assist with holding objects/opening containers.   Baseline R-35lbs, L-20lbs   Time 8   Period Weeks   Status New     OT LONG TERM GOAL #5   Title Pt will improve PPT#4 by at least 5 sec for incr ease with dressing.   Baseline 24.31sec   Time 8   Period Weeks   Status New     Long Term Additional Goals   Additional Long Term Goals Yes     OT LONG TERM GOAL #6   Title Pt will be able to fasten/unfasten 3 buttons in less than 37sec for incr ease with dressing.   Baseline 45.60   Time 8   Period Weeks   Status New               Plan - 10/29/16 1955  Clinical Impression Statement Pt presents with decr posture, decr functional mobility/balance, bradykinesia, rigidity, decr coordination, decr LUE functional use.  Pt would benefit from occupational therapy to address these deficits for improved ADL/IADL safety, improve LUE functional use, maintain indpendence, prevent future complications, and establish HEP.   Occupational Profile and client history currently impacting functional performance Pt is a 76 y.o. female with possible Parkinsonism and extremity weakness.  Pt lives alone and is independent but reports difficulty with ADLs/IADLs.  Pt also with hx of falls with serious injury (L humerus and R wrist fx in 2015-05-11).  PMH also includes:  degenerative joint disease R hip, a-fib, nontoxic multinodular goiter, and osteopenia.     Occupational performance deficits (Please refer to evaluation for details): ADL's;IADL's;Leisure    Rehab Potential Good   OT Frequency 2x / week   OT Duration 8 weeks  +eval   OT Treatment/Interventions Self-care/ADL training;Cryotherapy;Parrafin;Therapeutic exercise;DME and/or AE instruction;Therapist, nutritional;Therapeutic activities;Patient/family education;Balance training;Manual Therapy;Neuromuscular education;Fluidtherapy;Ultrasound;Moist Heat;Energy conservation;Passive range of motion;Therapeutic exercises   Plan assess box and blocks test and set STG as appropriate; begin PWR! moves in supine, PWR! hands   Clinical Decision Making Several treatment options, min-mod task modification necessary   Recommended Other Services current with PT   Consulted and Agree with Plan of Care Patient      Patient will benefit from skilled therapeutic intervention in order to improve the following deficits and impairments:  Decreased balance, Decreased mobility, Impaired tone, Decreased range of motion, Decreased activity tolerance, Decreased knowledge of precautions, Decreased coordination, Decreased knowledge of use of DME, Decreased safety awareness, Decreased strength, Impaired UE functional use, Improper body mechanics, Improper spinal/pelvic alignment (bradykinesia)  Visit Diagnosis: Other symptoms and signs involving the nervous system - Plan: Ot plan of care cert/re-cert  Other symptoms and signs involving the musculoskeletal system - Plan: Ot plan of care cert/re-cert  Other lack of coordination - Plan: Ot plan of care cert/re-cert  Abnormal posture - Plan: Ot plan of care cert/re-cert  Other abnormalities of gait and mobility - Plan: Ot plan of care cert/re-cert  Unsteadiness on feet - Plan: Ot plan of care cert/re-cert  Muscle weakness (generalized) - Plan: Ot plan of care cert/re-cert  Tremor      G-Codes - 21-Nov-2016 May 11, 2031    Functional Assessment Tool Used (Outpatient only) PPT#4:  24.31sec.  fastening/unfastening 3 buttons in 45.60sec.  Standing functional reach:   R-8", L-8.5", difficulty washing hair with LUE, significant micrographia   Functional Limitation Self care   Self Care Current Status (W2585) At least 40 percent but less than 60 percent impaired, limited or restricted   Self Care Goal Status (I7782) At least 1 percent but less than 20 percent impaired, limited or restricted      Problem List Patient Active Problem List   Diagnosis Date Noted  . Parkinsonism ([place]) 10/17/2016  . Fatigue 10/17/2016  . Long term (current) use of anticoagulants 05/29/2016  . Left wrist pain 01/03/2016  . Left hand pain 01/03/2016  . Vaginal atrophy 10/19/2015  . Tremor, unspecified 10/19/2015  . Osteopenia 10/19/2015  . 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 Closed fracture of part of upper end of humerus 05/01/2015  . Hematuria 02/04/2014  . 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 Cervical pain (neck) 06/20/2010  . Atrial fibrillation ([place]) 05/21/2010  . 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 Menopausal and postmenopausal disorder 02/16/2007  . DEGENERATIVE JOINT DISEASE, RIGHT HIP 02/16/2007    [facility] 2016/11/21, 9:09 PM  [place] [address] [place], [place], [address] Phone: [phone]   Fax:  [phone]  Name: Stephanie Shaw MRN: [id number] Date of Birth: October 29, 1940   Vianne Bulls, OTR/L [facility] [address] [place], [place]  [address] [phone] phone [phone] 10/29/16 9:09 PM

## 2016-10-29 NOTE — Therapy (Addendum)
[place] [address] [place], [place], [address] Phone: [phone]   Fax:  [phone]  Physical Therapy Treatment  Patient Details  Name: Stephanie Shaw MRN: [id number] Date of Birth: 03-20-1940 Referring Provider: Alonza Bogus, DO  Encounter Date: 10/29/2016      PT End of Session - 10/29/16 1147    Visit Number 7   Number of Visits 16   Date for PT Re-Evaluation 12/07/16   Authorization Type G code every 10th visit   PT Start Time 0852   PT Stop Time 0931   PT Time Calculation (min) 39 min   Activity Tolerance Patient tolerated treatment well   Behavior During Therapy [facility] for tasks assessed/performed      Past Medical History:  Diagnosis Date  . Colles' fracture of right radius 03/05/2015  . DEGENERATIVE JOINT DISEASE, RIGHT HIP 02/16/2007  . Dupuytren's contracture   . Osteoarthritis of hip    right  . Osteoarthritis of left knee   . POSTMENOPAUSAL SYNDROME 02/16/2007  . PREMATURE ATRIAL CONTRACTIONS 02/16/2007  . S/P breast biopsy, left    two o'clock position - benign  . 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 Venous insufficiency     Past Surgical History:  Procedure Laterality Date  . BREAST BIOPSY Left   . CATARACT EXTRACTION, BILATERAL      There were no vitals filed for this visit.      Subjective Assessment - 10/29/16 0852    Subjective Nothing new since I saw you last.  I may have pulled a muscle in my L hip.  Not sure how I did it.   Pertinent History a-fib, cateract surgery   Patient Stated Goals "I'd like to get my motion back in my left side, try to help tremors."   Currently in Pain? Yes   Pain Score 5    Pain Location Hip   Pain Orientation Left   Pain Descriptors / Indicators --  Pulling   Pain Type Acute pain   Pain Onset In the past 7 days   Pain Frequency Intermittent   Aggravating Factors  with L hip flexion, lifting leg   Pain Relieving Factors resting  alleviates pain                         [facility] Adult PT Treatment/Exercise - 10/29/16 0001      Transfers   Transfers Sit to Stand;Stand to Sit   Sit to Stand 5: Supervision;6: Modified independent (Device/Increase time);Without upper extremity assist;From bed   Five time sit to stand comments  16.56   Stand to Sit 5: Supervision;Without upper extremity assist;With upper extremity assist;To chair/3-in-1;To bed   Comments Review of sit<>stand transfers as part of HEP- pt return demo understanding x 5 reps     Standardized Balance Assessment   Standardized Balance Assessment Timed Up and Go Test     Timed Up and Go Test   TUG Normal TUG   Normal TUG (seconds) 13.31     High Level Balance   High Level Balance Comments REviewed marching in place, marching full turn to R and to L, with pt return demo understanding           PWR [facility]) - 10/29/16 0910    PWR! exercises Moves in standing   PWR! Up x 10   PWR! Rock x 20   PWR! Twist x 20   PWR! Step x 20 with UE  support   Comments Review of HEP-pt return demo understanding          Balance Exercises - 10/29/16 0921      Balance Exercises: Standing   Stepping Strategy Anterior;Posterior;10 reps;UE support   Retro Gait Upper extremity support;3 reps;Foam/compliant surface  At counter, forward and back (solid and foam)   Marching Limitations Marching in place x 10 reps on blue foam surface   Heel Raises Limitations x 20   Toe Raise Limitations x 20   Other Standing Exercises Forward/back step and weigthshift x 15 reps.  Standing on foam:  Alternating forward kicks x 10 reps, then alternating forward step taps x 10 reps with intermittent UE support.  Standing on foam with head turns x 5, head nods x 5 with intermittent UE support, min guard assistance.  each side at counter      Reviewed seated hamstring stretch, with postural cues given.  Briefly discussed POC and plan to continue towards LTGs.  Pt agrees to  schedule additional 1-2 weeks of PT towards LTGs.       PT Short Term Goals - 10/29/16 1144      PT SHORT TERM GOAL #1   Title The patient will be independent with home exercise program for large amplitude, weight shifting to improve functional mobility.   Time 4   Period Weeks   Status Achieved     PT SHORT TERM GOAL #2   Title The patient will be further assessed on 5x sit to stand and LTG to follow if indicated.     Baseline 9/26  17.53 seconds; 16.56 sec 10/29/16   Time 4   Period Weeks   Status Achieved     PT SHORT TERM GOAL #3   Title The patient will improve FGA to 20/30 to demonstrate improving dynamic gait activities.   Baseline 16/30   Time 4   Period Weeks   Status New     PT SHORT TERM GOAL #4   Title The patient will improve backwards walking gait speed to > or equal to 1.8 ft/sec.   Baseline 1.42 ft/sec   Time 4   Period Weeks   Status New     PT SHORT TERM GOAL #5   Title The patient will verbalize understanding of community resources for Parkinson's disease (including power over parkinson's and group exercise classes).   Baseline Has ben provided with POP info, will provide exercise class info as appropriate when approaching d/c   Time 4   Period Weeks   Status On-going           PT Long Term Goals - 10/15/16 1610      PT LONG TERM GOAL #1   Title The patinet will be indep with HEP for post d/c progression of activities.   Time 8   Period Weeks     PT LONG TERM GOAL #2   Title 5 time sit<>stand goal to follow, if indicated. 9/26 Goal for <16.0 seconds (cut-off score for increased fall risk in Parkinson patients)   Baseline 9/26 17.53   Time 8   Period Weeks   Status Revised     PT LONG TERM GOAL #3   Title The patient will improve Berg balance test to > or equal to 48/56 to demo dec'd risk for falls.   Baseline Berg=44/56   Time 8   Period Weeks     PT LONG TERM GOAL #4   Title The patient will improve FGA to >  or equal to 23/30 to  demo improving dynamic gait.   Baseline FGA=16/30   Time 8     PT LONG TERM GOAL #5   Title The patient will demonstrate car transfer from her [facility] to demo safety with functional transfers.   Time 8   Period Weeks     PT LONG TERM GOAL #6   Title The patient will return to unlevel surface ambulation in order to return to level, wide path trails in order to participate in prior recreational activities.   Baseline patient unable to hike, participate in prior outdoor activities   Time 8   Period Weeks               Plan - 10/29/16 1148    Clinical Impression Statement Began assessing STGs this visit, with STG 1 met for independence with HEP, STG 2 met, with 5x sit<>stand score improved by one second (LTG set for 5x sit<>stand).  STG 5 ongoing, as further community fitness options will be discussed with patient as pt approaches discharge.  Pt feels she is not noticing big improvements with therapy, but PT and patient agree that strategies for turns and movement patterns are helping patient be more aware of movement patterns in general.  Pt will continue to benefit from further skilled PT to address posture, balance and gait.   Rehab Potential Good   Clinical Impairments Affecting Rehab Potential Enhancers to rehab potential are:  motivation, prior fitness level, engagement of patient.     PT Frequency 2x / week   PT Duration 8 weeks   PT Treatment/Interventions ADLs/Self Care Home Management;Therapeutic activities;Therapeutic exercise;Balance training;Neuromuscular re-education;Gait training;Stair training;Functional mobility training;Patient/family education;Manual techniques   PT Next Visit Plan Check remaining STGs; balance on compliant surfaces; may try other sets of PWR! Moves (in conjunction with OT) to see if patient would be appropriate for joining PWR! Moves exercise class   PT Home Exercise Plan sit<>stand, hamstring stretch; PWR Up & Rock (pt is performing PWR! Twist and  step in standing-educated to have UE support for stepping)   Consulted and Agree with Plan of Care Patient      Patient will benefit from skilled therapeutic intervention in order to improve the following deficits and impairments:  Abnormal gait, Decreased balance, Decreased strength, Decreased coordination, Difficulty walking, Postural dysfunction, Decreased mobility (bradykinesia, tremors)  Visit Diagnosis: Other symptoms and signs involving the nervous system  Abnormal posture     Problem List Patient Active Problem List   Diagnosis Date Noted  . Parkinsonism ([place]) 10/17/2016  . Fatigue 10/17/2016  . Long term (current) use of anticoagulants 05/29/2016  . Left wrist pain 01/03/2016  . Left hand pain 01/03/2016  . Vaginal atrophy 10/19/2015  . Tremor, unspecified 10/19/2015  . Osteopenia 10/19/2015  . 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 Closed fracture of part of upper end of humerus 05/01/2015  . Hematuria 02/04/2014  . 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 Cervical pain (neck) 06/20/2010  . Atrial fibrillation ([place]) 05/21/2010  . 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 Menopausal and postmenopausal disorder 02/16/2007  . DEGENERATIVE JOINT DISEASE, RIGHT HIP 02/16/2007    Frazier Butt. 10/29/2016, 11:57 AM Frazier Butt., PT  [place] [address] [place], [place], [address] Phone: [phone]   Fax:  [phone]  Name: Stephanie Shaw MRN: [id number] Date of Birth: 04-07-1940   Mady Haagensen, PT 11/03/16 2:37 PM Phone: [phone] Fax: [phone]

## 2016-10-31 ENCOUNTER — Ambulatory Visit: Payer: Medicare Other | Admitting: Rehabilitative and Restorative Service Providers"

## 2016-11-03 ENCOUNTER — Ambulatory Visit: Payer: Medicare Other | Admitting: Physical Therapy

## 2016-11-03 ENCOUNTER — Ambulatory Visit: Payer: Medicare Other | Admitting: Occupational Therapy

## 2016-11-03 DIAGNOSIS — R2689 Other abnormalities of gait and mobility: Secondary | ICD-10-CM

## 2016-11-03 DIAGNOSIS — R251 Tremor, unspecified: Secondary | ICD-10-CM

## 2016-11-03 DIAGNOSIS — R29898 Other symptoms and signs involving the musculoskeletal system: Secondary | ICD-10-CM

## 2016-11-03 DIAGNOSIS — R29818 Other symptoms and signs involving the nervous system: Secondary | ICD-10-CM

## 2016-11-03 DIAGNOSIS — R278 Other lack of coordination: Secondary | ICD-10-CM

## 2016-11-03 DIAGNOSIS — R2681 Unsteadiness on feet: Secondary | ICD-10-CM

## 2016-11-03 DIAGNOSIS — M6281 Muscle weakness (generalized): Secondary | ICD-10-CM

## 2016-11-03 DIAGNOSIS — R293 Abnormal posture: Secondary | ICD-10-CM

## 2016-11-03 NOTE — Patient Instructions (Signed)
PWR! Hand Exercises  Then, start with elbows bent and hands closed:   PWR! Hands: Push hands out BIG. Elbows straight, wrists up, fingers open and spread apart BIG. (Can also perform by pushing down on table, chair, knees. Push above head, out to the side, behind you, in front of you.)   PWR! Step: Touch index finger to thumb while keeping other fingers straight. Flick fingers out BIG (thumb out/straighten fingers). Repeat with other fingers. (Step your thumb to each finger).   With arms stretched out in front of you (elbows straight), perform the following:   PWR! Rock:  Move wrists up and down [employer]! Twist: Twist palms up and down BIG    ** Make each movement big and deliberate so that you feel the movement.  Perform at least 10 repetitions 1x/day, but perform PWR! Hands throughout the day when you are having trouble using your hands (picking up/manipulating small objects, writing, eating, typing, sewing, buttoning, etc.).

## 2016-11-03 NOTE — Therapy (Signed)
[facility] [address] [place], [place], [address] Phone: [phone]   Fax:  [phone]  Occupational Therapy Treatment  Patient Details  Name: Stephanie Shaw MRN: [id number] Date of Birth: 12-20-1940 Referring Provider: Dr. Wells Guiles [name]   Encounter Date: 11/03/2016      OT End of Session - 11/03/16 0815    Visit Number 2   Number of Visits 17   Date for OT Re-Evaluation 12/28/16   Authorization Type [employer], no visit limit, no auth needed.  G-code needed   Authorization - Visit Number 2   Authorization - Number of Visits 10   OT Start Time 0848   OT Stop Time 0930   OT Time Calculation (min) 42 min   Activity Tolerance Patient tolerated treatment well   Behavior During Therapy WFL for tasks assessed/performed      Past Medical History:  Diagnosis Date  . Colles' fracture of right radius 03/05/2015  . DEGENERATIVE JOINT DISEASE, RIGHT HIP 02/16/2007  . Dupuytren's contracture   . Osteoarthritis of hip    right  . Osteoarthritis of left knee   . POSTMENOPAUSAL SYNDROME 02/16/2007  . PREMATURE ATRIAL CONTRACTIONS 02/16/2007  . S/P breast biopsy, left    two o'clock position - benign  . 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 Venous insufficiency     Past Surgical History:  Procedure Laterality Date  . BREAST BIOPSY Left   . CATARACT EXTRACTION, BILATERAL      There were no vitals filed for this visit.      Subjective Assessment - 11/03/16 0814    Subjective  fear of going down esculator     Pertinent History possible Parkinsonism, hx of multiple falls (fx with 2 falls), degenerative joint disease R hip, a-fib, nontoxic multinodular goiter, hx of L humerus fx 2017, osteopenia, R wrist fx 2017   Limitations fall risk   Patient Stated Goals improve control and strength of LUE   Currently in Pain? No/denies        PWR! Moves (basic 4) in supine and PWR! hands x 20 each with min cues For  incr movement amplitude/technique.   Supine>sitting with  cues for use of large amplitude movement strategy to incr ease and decr stress on back to prevent pain.  Pt verbalized understanding and returned demo with min cueing initially.  Began education regarding how use of large amplitude movement strategies/PWR! hands can help with bradykinesia and functional tasks.  Pt verbalized understanding.                          OT Education - 11/03/16 0906    Education Details PWR! moves in supine (basic 4); PWR! hands (basic 4)   Person(s) Educated Patient   Methods Explanation;Demonstration;Verbal cues;Handout   Comprehension Verbalized understanding;Returned demonstration;Verbal cues required  min cueing for technique/large amplitude          OT Short Term Goals - 10/29/16 2007      OT SHORT TERM GOAL #1   Title Pt will be independent with HEP.--check STGs 11/28/16   Time 4   Period Weeks   Status New     OT SHORT TERM GOAL #2   Title Pt will demo large amplitude movement strategies during transitional movements (bending, reaching, carrying objects, turning) with only occasional min v.c.   Time 4   Period Weeks   Status New     OT SHORT TERM GOAL #3  Title Pt will report improved ability to wash hair with LUE.   Time 4   Period Weeks   Status New     OT SHORT TERM GOAL #4   Title Pt will be able to write at least 3 sentences with only mild micrographia.   Time 4   Period Weeks   Status New     OT SHORT TERM GOAL #5   Title Pt will verbalize understanding of ways to prevent future complications and appropriate community resources.   Time 4   Period Weeks   Status New     Additional Short Term Goals   Additional Short Term Goals Yes     OT SHORT TERM GOAL #6   Title Assess box and blocks test for functional reaching/coordination and set goal as appropriate   Time 4   Period Weeks   Status New           OT Long Term Goals - 10/29/16 2016       OT LONG TERM GOAL #1   Title Pt will verbalize understanding of adative strategies/AE to incr safety/ease with ADLs/IADLs.--check LTGs 12/28/16   Time 8   Period Weeks   Status New     OT LONG TERM GOAL #2   Title Pt will improve L hand coordination for ADLs as shown by improving time on 9-hole peg test by at least 5sec.   Baseline 34.06sec   Time 8   Period Weeks   Status New     OT LONG TERM GOAL #3   Title Pt will improve balance for IADLs as shown by improving standing functional reach to at least 10" bilaterally.   Baseline R-8', L-8.5"   Time 8   Period Weeks   Status New     OT LONG TERM GOAL #4   Title Pt will improve L grip strength by least 5lbs to assist with holding objects/opening containers.   Baseline R-35lbs, L-20lbs   Time 8   Period Weeks   Status New     OT LONG TERM GOAL #5   Title Pt will improve PPT#4 by at least 5 sec for incr ease with dressing.   Baseline 24.31sec   Time 8   Period Weeks   Status New     Long Term Additional Goals   Additional Long Term Goals Yes     OT LONG TERM GOAL #6   Title Pt will be able to fasten/unfasten 3 buttons in less than 37sec for incr ease with dressing.   Baseline 45.60   Time 8   Period Weeks   Status New               Plan - 11/03/16 6433    Clinical Impression Statement Pt responds well to cueing for large amplitude movements and repetition.  Pt is progresssing towards goals.   Rehab Potential Good   OT Frequency 2x / week   OT Duration 8 weeks  +eval   OT Treatment/Interventions Self-care/ADL training;Cryotherapy;Parrafin;Therapeutic exercise;DME and/or AE instruction;Therapist, nutritional;Therapeutic activities;Patient/family education;Balance training;Manual Therapy;Neuromuscular education;Fluidtherapy;Ultrasound;Moist Heat;Energy conservation;Passive range of motion;Therapeutic exercises   Plan review supine PWR!, PWR! hands, initiate coordination HEP; assess box and blocks and  add goal if appropriate   OT Home Exercise Plan Education provided:  PWR! moves in supine; PWR! hands (basic 4)   Consulted and Agree with Plan of Care Patient      Patient will benefit from skilled therapeutic intervention in order to improve the following  deficits and impairments:  Decreased balance, Decreased mobility, Impaired tone, Decreased range of motion, Decreased activity tolerance, Decreased knowledge of precautions, Decreased coordination, Decreased knowledge of use of DME, Decreased safety awareness, Decreased strength, Impaired UE functional use, Improper body mechanics, Improper spinal/pelvic alignment (bradykinesia)  Visit Diagnosis: Other symptoms and signs involving the nervous system  Other symptoms and signs involving the musculoskeletal system  Other lack of coordination  Abnormal posture  Other abnormalities of gait and mobility  Unsteadiness on feet  Muscle weakness (generalized)  Tremor    Problem List Patient Active Problem List   Diagnosis Date Noted  . Parkinsonism ([place]) 10/17/2016  . Fatigue 10/17/2016  . Long term (current) use of anticoagulants 05/29/2016  . Left wrist pain 01/03/2016  . Left hand pain 01/03/2016  . Vaginal atrophy 10/19/2015  . Tremor, unspecified 10/19/2015  . Osteopenia 10/19/2015  . 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 Closed fracture of part of upper end of humerus 05/01/2015  . Hematuria 02/04/2014  . 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 Cervical pain (neck) 06/20/2010  . Atrial fibrillation ([place]) 05/21/2010  . 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 Menopausal and postmenopausal disorder 02/16/2007  . DEGENERATIVE JOINT DISEASE, RIGHT HIP 02/16/2007    [facility] 11/03/2016, 12:58 PM  [employer] [address] [place], [place], [address] Phone: [phone]   Fax:  [phone]  Name: PLACIDA CAMBRE MRN: [id number] Date of  Birth: Jul 12, 1940   Vianne Bulls, OTR/L [facility] [address] [place], [place]  [address] [phone] phone [phone] 11/03/16 12:58 PM

## 2016-11-03 NOTE — Therapy (Signed)
[facility] [address] [place], [place], [address] Phone: [phone]   Fax:  [phone]  Physical Therapy Treatment  Patient Details  Name: Stephanie Shaw MRN: [id number] Date of Birth: 09/28/1940 Referring Provider: Alonza Bogus, DO  Encounter Date: 11/03/2016      PT End of Session - 11/03/16 1438    Visit Number 8   Number of Visits 16   Date for PT Re-Evaluation 12/07/16   Authorization Type G code every 10th visit   PT Start Time 0805   PT Stop Time 0846   PT Time Calculation (min) 41 min   Activity Tolerance Patient tolerated treatment well   Behavior During Therapy [facility] for tasks assessed/performed      Past Medical History:  Diagnosis Date  . Colles' fracture of right radius 03/05/2015  . DEGENERATIVE JOINT DISEASE, RIGHT HIP 02/16/2007  . Dupuytren's contracture   . Osteoarthritis of hip    right  . Osteoarthritis of left knee   . POSTMENOPAUSAL SYNDROME 02/16/2007  . PREMATURE ATRIAL CONTRACTIONS 02/16/2007  . S/P breast biopsy, left    two o'clock position - benign  . 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 Venous insufficiency     Past Surgical History:  Procedure Laterality Date  . BREAST BIOPSY Left   . CATARACT EXTRACTION, BILATERAL      There were no vitals filed for this visit.      Subjective Assessment - 11/03/16 0807    Subjective I've thought about a problem I have.  I cannot go down elevators anymore-it scares me so much.   Pertinent History a-fib, cateract surgery   Patient Stated Goals "I'd like to get my motion back in my left side, try to help tremors."   Currently in Pain? No/denies   Pain Onset In the past 7 days            [facility] PT Assessment - 11/03/16 0809      Functional Gait  Assessment   Gait assessed  Yes   Gait Level Surface Walks 20 ft in less than 5.5 sec, no assistive devices, good speed, no evidence for imbalance, normal gait pattern, deviates no  more than 6 in outside of the 12 in walkway width.   Change in Gait Speed Able to smoothly change walking speed without loss of balance or gait deviation. Deviate no more than 6 in outside of the 12 in walkway width.   Gait with Horizontal Head Turns Performs head turns smoothly with no change in gait. Deviates no more than 6 in outside 12 in walkway width   Gait with Vertical Head Turns Performs task with slight change in gait velocity (eg, minor disruption to smooth gait path), deviates 6 - 10 in outside 12 in walkway width or uses assistive device   Gait and Pivot Turn Pivot turns safely within 3 sec and stops quickly with no loss of balance.   Step Over Obstacle Is able to step over one shoe box (4.5 in total height) without changing gait speed. No evidence of imbalance.   Gait with Narrow Base of Support Ambulates less than 4 steps heel to toe or cannot perform without assistance.   Gait with Eyes Closed Walks 20 ft, uses assistive device, slower speed, mild gait deviations, deviates 6-10 in outside 12 in walkway width. Ambulates 20 ft in less than 9 sec but greater than 7 sec.  8.88 sec   Ambulating Backwards Walks 20 ft, uses assistive  device, slower speed, mild gait deviations, deviates 6-10 in outside 12 in walkway width.  10.41 sec   Steps Alternating feet, must use rail.   Total Score 22   FGA comment: Improved from 16/30                     [facility] Adult PT Treatment/Exercise - 11/03/16 0809      Ambulation/Gait   Ambulation/Gait Yes   Ambulation/Gait Assistance 5: Supervision   Ambulation Distance (Feet) 120 Feet   Assistive device None   Gait Pattern Decreased arm swing - right;Decreased arm swing - left;Decreased trunk rotation;Narrow base of support;Decreased stride length;Right foot flat;Left foot flat;Right flexed knee in stance;Left flexed knee in stance   Ambulation Surface Level;Indoor   Gait velocity - backwards 54f /10.41 sec= 1.9 ft/sec              Balance Exercises - 11/03/16 0833      Balance Exercises: Standing   Standing Eyes Opened Wide (BOA);Narrow base of support (BOS);Head turns;Foam/compliant surface  Head nods, 10 reps each   Standing Eyes Closed Wide (BOA);Narrow base of support (BOS);Foam/compliant surface;2 reps;10 secs   Other Standing Exercises Standing on compliant surface:  marching in place x 10 reps, alternating forward kicks x 10, alternating step taps x 10 reps; then standing on balance beam:  marching in place x 10, forward kicks x 10, alternating forward step taps x 10 reps (cues provided for increased intensity of movement to clear foot from compliant surface)           PT Education - 11/03/16 1435    Education provided Yes   Education Details HEP to address balance-corner balance exercises   Person(s) Educated Patient   Methods Explanation;Demonstration;Handout   Comprehension Verbalized understanding;Returned demonstration;Verbal cues required     Self Care:  Assessed pt's vestibular system for balance, based on pt's c/o of new fear of/inability to use down escalators.  Pt able to stand on foam EO x 30 seconds.  Pt able to stand on foam EC <10 seconds with increased sway, needing to open eyes (indicative of decreased vestibular system use for balance).  PT explained relationship of 3 sensory systems for balance, and how decreased vestibular system use for balance may be contributing to pt's fear of escalator use.  Discussed we would attempt to address this through exercises, but for now, pt should look for stairs or elevator for safety.      PT Short Term Goals - 11/03/16 0809      PT SHORT TERM GOAL #1   Title The patient will be independent with home exercise program for large amplitude, weight shifting to improve functional mobility.   Time 4   Period Weeks   Status Achieved     PT SHORT TERM GOAL #2   Title The patient will be further assessed on 5x sit to stand and LTG to follow if indicated.      Baseline 9/26  17.53 seconds; 16.56 sec 10/29/16   Time 4   Period Weeks   Status Achieved     PT SHORT TERM GOAL #3   Title The patient will improve FGA to 20/30 to demonstrate improving dynamic gait activities.   Baseline 16/30   Time 4   Period Weeks   Status Achieved     PT SHORT TERM GOAL #4   Title The patient will improve backwards walking gait speed to > or equal to 1.8 ft/sec.   Baseline 1.42  ft/sec   Time 4   Period Weeks   Status Achieved     PT SHORT TERM GOAL #5   Title The patient will verbalize understanding of community resources for Parkinson's disease (including power over parkinson's and group exercise classes).   Baseline Has ben provided with POP info, will provide exercise class info as appropriate when approaching d/c   Time 4   Period Weeks   Status On-going           PT Long Term Goals - 10/15/16 2563      PT LONG TERM GOAL #1   Title The patinet will be indep with HEP for post d/c progression of activities.   Time 8   Period Weeks     PT LONG TERM GOAL #2   Title 5 time sit<>stand goal to follow, if indicated. 9/26 Goal for <16.0 seconds (cut-off score for increased fall risk in Parkinson patients)   Baseline 9/26 17.53   Time 8   Period Weeks   Status Revised     PT LONG TERM GOAL #3   Title The patient will improve Berg balance test to > or equal to 48/56 to demo dec'd risk for falls.   Baseline Berg=44/56   Time 8   Period Weeks     PT LONG TERM GOAL #4   Title The patient will improve FGA to > or equal to 23/30 to demo improving dynamic gait.   Baseline FGA=16/30   Time 8     PT LONG TERM GOAL #5   Title The patient will demonstrate car transfer from her [facility] to demo safety with functional transfers.   Time 8   Period Weeks     PT LONG TERM GOAL #6   Title The patient will return to unlevel surface ambulation in order to return to level, wide path trails in order to participate in prior recreational activities.    Baseline patient unable to hike, participate in prior outdoor activities   Time 8   Period Weeks               Plan - 11/03/16 1438    Clinical Impression Statement Pt has met STG 3 and 4, improving Functional Gait Assessment score and backwards gait velocity.  Pt begins session by discussing recent fear of/inability to go down escalators, so PT briefly assessed vestibular system for balance on foam, and pt appears to have decreased vestibular system use for balance.  Began to address through corner, compliant surface activities with EC and with head movements.  Pt will continue to benefit from further skilled PT to address posture, balance and gait.   Rehab Potential Good   Clinical Impairments Affecting Rehab Potential Enhancers to rehab potential are:  motivation, prior fitness level, engagement of patient.     PT Frequency 2x / week   PT Duration 8 weeks   PT Treatment/Interventions ADLs/Self Care Home Management;Therapeutic activities;Therapeutic exercise;Balance training;Neuromuscular re-education;Gait training;Stair training;Functional mobility training;Patient/family education;Manual techniques   PT Next Visit Plan Review corner balance exercises and continue balance activities on compliant surfaces; may try other sets of PWR! Moves (in conjunction with OT) to see if patient appropriate for joining PWR! Moves exercise class    PT Home Exercise Plan sit<>stand, hamstring stretch; PWR Up & Rock (pt is performing PWR! Twist and step in standing-educated to have UE support for stepping)   Consulted and Agree with Plan of Care Patient      Patient will benefit from  skilled therapeutic intervention in order to improve the following deficits and impairments:  Abnormal gait, Decreased balance, Decreased strength, Decreased coordination, Difficulty walking, Postural dysfunction, Decreased mobility (bradykinesia, tremors)  Visit Diagnosis: Unsteadiness on feet  Other abnormalities of  gait and mobility  Other symptoms and signs involving the nervous system     Problem List Patient Active Problem List   Diagnosis Date Noted  . Parkinsonism ([place]) 10/17/2016  . Fatigue 10/17/2016  . Long term (current) use of anticoagulants 05/29/2016  . Left wrist pain 01/03/2016  . Left hand pain 01/03/2016  . Vaginal atrophy 10/19/2015  . Tremor, unspecified 10/19/2015  . Osteopenia 10/19/2015  . 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 Closed fracture of part of upper end of humerus 05/01/2015  . Hematuria 02/04/2014  . 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 Cervical pain (neck) 06/20/2010  . Atrial fibrillation ([place]) 05/21/2010  . 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 Menopausal and postmenopausal disorder 02/16/2007  . DEGENERATIVE JOINT DISEASE, RIGHT HIP 02/16/2007    Frazier Butt. 11/03/2016, 2:43 PM  Frazier Butt., PT  [facility] [address] [place], [place], [address] Phone: [phone]   Fax:  [phone]  Name: Stephanie Shaw MRN: [id number] Date of Birth: [birth year]

## 2016-11-03 NOTE — Patient Instructions (Addendum)
FOR THE EXERCISES BELOW, STAND IN A CORNER AND HAVE A CHAIR FOR SUPPORT IN FRONT OF YOU  Feet Apart (Compliant Surface) Head Motion - Eyes Open    With eyes open, standing on compliant surface: _pillow or towel_______, feet shoulder width apart, move head slowly: up and down. Repeat __10__ times.  Then move head slowly side to side, 10 times.  Do __1-2__ sessions per day.  Copyright  VHI. All rights reserved.  Feet Apart (Compliant Surface) Varied Arm Positions - Eyes Closed    Stand on compliant surface: __pillow or towel______ with feet shoulder width apart and arms at the chair or by your side.  Hold lightly with the support you need to stay steady.  Close eyes and visualize upright position. Hold__10__ seconds. Repeat __3__ times per session. Do __1-2__ sessions per day.  Copyright  VHI. All rights reserved.  Feet Together (Compliant Surface) Head Motion - Eyes Open    With eyes open, standing on compliant surface: __pillow or towel_____, feet together, move head slowly: up and down 10 times.  Then move head slowly side to side 10 times.  Do __1-2__ sessions per day.  Copyright  VHI. All rights reserved.  Feet Together, Varied Arm Positions - Eyes Closed-ON FOAM    Stand with feet together and arms by your side or holding onto chair in front of you (stand on pillow or towel). Close eyes and visualize upright position. Hold __10__ seconds. Repeat _3___ times per session. Do __1-2__ sessions per day.  Copyright  VHI. All rights reserved.

## 2016-11-05 ENCOUNTER — Ambulatory Visit: Payer: Medicare Other | Admitting: Physical Therapy

## 2016-11-05 DIAGNOSIS — R2689 Other abnormalities of gait and mobility: Secondary | ICD-10-CM | POA: Diagnosis not present

## 2016-11-05 DIAGNOSIS — R29818 Other symptoms and signs involving the nervous system: Secondary | ICD-10-CM

## 2016-11-05 DIAGNOSIS — R2681 Unsteadiness on feet: Secondary | ICD-10-CM

## 2016-11-05 DIAGNOSIS — R293 Abnormal posture: Secondary | ICD-10-CM

## 2016-11-05 NOTE — Therapy (Signed)
[facility] [address] [place], [place], [address] Phone: [phone]   Fax:  [phone]  Physical Therapy Treatment  Patient Details  Name: Stephanie Shaw MRN: [id number] Date of Birth: [birth year] Referring Provider: Alonza Bogus, DO  Encounter Date: 11/05/2016      PT End of Session - 11/05/16 2223    Visit Number 9   Number of Visits 16   Date for PT Re-Evaluation 12/07/16   Authorization Type G code every 10th visit   PT Start Time 0806   PT Stop Time 0846   PT Time Calculation (min) 40 min   Activity Tolerance Patient tolerated treatment well   Behavior During Therapy [facility] for tasks assessed/performed      Past Medical History:  Diagnosis Date  . Colles' fracture of right radius 03/05/2015  . DEGENERATIVE JOINT DISEASE, RIGHT HIP 02/16/2007  . Dupuytren's contracture   . Osteoarthritis of hip    right  . Osteoarthritis of left knee   . POSTMENOPAUSAL SYNDROME 02/16/2007  . PREMATURE ATRIAL CONTRACTIONS 02/16/2007  . S/P breast biopsy, left    two o'clock position - benign  . 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 Venous insufficiency     Past Surgical History:  Procedure Laterality Date  . BREAST BIOPSY Left   . CATARACT EXTRACTION, BILATERAL      There were no vitals filed for this visit.      Subjective Assessment - 11/05/16 0807    Subjective Enjoyed the Power over Parkinson's disease group yesterday.  Soreness a little in my shoulders from OT exercises.   Pertinent History a-fib, cateract surgery   Patient Stated Goals "I'd like to get my motion back in my left side, try to help tremors."   Currently in Pain? No/denies  "not pain, just soreness"   Pain Onset --                            PWR [facility]) - 11/05/16 0831    PWR! exercises Moves in quadraped  Modified quadruped at [employer] Up x 20   PWR! Rock x 20  switched from chair to counter UE support   PWR! Twist x 10 reps each side  Modified position at counter   PWR! Step x 10 reps each side  Modified at counter   Comments Verbal and visual cues provided for technique          Balance Exercises - 11/05/16 0813      Balance Exercises: Standing   Standing Eyes Opened Wide (BOA);Narrow base of support (BOS);Head turns;Foam/compliant surface  Head nods x 10, review of HEP with support   Standing Eyes Closed Wide (BOA);Narrow base of support (BOS);Foam/compliant surface;10 secs;1 rep  Review of HEP   Rockerboard Anterior/posterior;Head turns;EO  Head turns, UE swing/lifts/rotation, step taps forward   Balance Beam forward/back step taps x 10 reps each leg with UE support   Other Standing Exercises Standing in corner on 2 pillows:  marching in place x 10, forward kicks x 10, forward step taps x 10 with UE support.  The above exercises also performed in parallel bars on balance beam.             PT Short Term Goals - 11/03/16 0809      PT SHORT TERM GOAL #1   Title The patient will be independent with home exercise program for large amplitude, weight shifting to  improve functional mobility.   Time 4   Period Weeks   Status Achieved     PT SHORT TERM GOAL #2   Title The patient will be further assessed on 5x sit to stand and LTG to follow if indicated.     Baseline 9/26  17.53 seconds; 16.56 sec 10/29/16   Time 4   Period Weeks   Status Achieved     PT SHORT TERM GOAL #3   Title The patient will improve FGA to 20/30 to demonstrate improving dynamic gait activities.   Baseline 16/30   Time 4   Period Weeks   Status Achieved     PT SHORT TERM GOAL #4   Title The patient will improve backwards walking gait speed to > or equal to 1.8 ft/sec.   Baseline 1.42 ft/sec   Time 4   Period Weeks   Status Achieved     PT SHORT TERM GOAL #5   Title The patient will verbalize understanding of community resources for Parkinson's disease (including power over parkinson's and  group exercise classes).   Baseline Has ben provided with POP info, will provide exercise class info as appropriate when approaching d/c   Time 4   Period Weeks   Status On-going           PT Long Term Goals - 10/15/16 4580      PT LONG TERM GOAL #1   Title The patinet will be indep with HEP for post d/c progression of activities.   Time 8   Period Weeks     PT LONG TERM GOAL #2   Title 5 time sit<>stand goal to follow, if indicated. 9/26 Goal for <16.0 seconds (cut-off score for increased fall risk in Parkinson patients)   Baseline 9/26 17.53   Time 8   Period Weeks   Status Revised     PT LONG TERM GOAL #3   Title The patient will improve Berg balance test to > or equal to 48/56 to demo dec'd risk for falls.   Baseline Berg=44/56   Time 8   Period Weeks     PT LONG TERM GOAL #4   Title The patient will improve FGA to > or equal to 23/30 to demo improving dynamic gait.   Baseline FGA=16/30   Time 8     PT LONG TERM GOAL #5   Title The patient will demonstrate car transfer from her [facility] to demo safety with functional transfers.   Time 8   Period Weeks     PT LONG TERM GOAL #6   Title The patient will return to unlevel surface ambulation in order to return to level, wide path trails in order to participate in prior recreational activities.   Baseline patient unable to hike, participate in prior outdoor activities   Time 8   Period Weeks               Plan - 11/05/16 2224    Clinical Impression Statement Reviewed corner balance HEP and further addressed balance on compliant surfaces.  Also worked in modified quadruped PWR! Moves position to work on posture, flexbility, weightshifting and stepping.  Pt will continue to benefit from skilled PT to address balance, posture and gait.   Rehab Potential Good   Clinical Impairments Affecting Rehab Potential Enhancers to rehab potential are:  motivation, prior fitness level, engagement of patient.     PT  Frequency 2x / week   PT Duration 8 weeks  PT Treatment/Interventions ADLs/Self Care Home Management;Therapeutic activities;Therapeutic exercise;Balance training;Neuromuscular re-education;Gait training;Stair training;Functional mobility training;Patient/family education;Manual techniques   PT Next Visit Plan Continue balance activities on compliant surfaces; may try other sets of PWR! Moves (in conjunction with OT) to see if patient appropriate for joining PWR! Moves exercise class ; look at LTGs and discuss d/c versus continue PT (as pt had requested not adding PT appts for full POC)   PT Home Exercise Plan sit<>stand, hamstring stretch; PWR Up & Rock (pt is performing PWR! Twist and step in standing-educated to have UE support for stepping)   Consulted and Agree with Plan of Care Patient      Patient will benefit from skilled therapeutic intervention in order to improve the following deficits and impairments:  Abnormal gait, Decreased balance, Decreased strength, Decreased coordination, Difficulty walking, Postural dysfunction, Decreased mobility (bradykinesia, tremors)  Visit Diagnosis: Unsteadiness on feet  Other symptoms and signs involving the nervous system  Abnormal posture     Problem List Patient Active Problem List   Diagnosis Date Noted  . Parkinsonism ([place]) 10/17/2016  . Fatigue 10/17/2016  . Long term (current) use of anticoagulants 05/29/2016  . Left wrist pain 01/03/2016  . Left hand pain 01/03/2016  . Vaginal atrophy 10/19/2015  . Tremor, unspecified 10/19/2015  . Osteopenia 10/19/2015  . 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 Closed fracture of part of upper end of humerus 05/01/2015  . Hematuria 02/04/2014  . 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 Cervical pain (neck) 06/20/2010  . Atrial fibrillation ([place]) 05/21/2010  . 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 Menopausal and postmenopausal disorder 02/16/2007  . DEGENERATIVE JOINT DISEASE,  RIGHT HIP 02/16/2007    Frazier Butt. 11/05/2016, 10:29 PM  Frazier Butt., PT   [facility] [address] [place], [place], [address] Phone: [phone]   Fax:  [phone]  Name: CAASI GIGLIA MRN: [id number] Date of Birth: July 19, 1940

## 2016-11-11 ENCOUNTER — Ambulatory Visit: Payer: Medicare Other | Admitting: Occupational Therapy

## 2016-11-11 ENCOUNTER — Ambulatory Visit: Payer: Medicare Other | Admitting: Physical Therapy

## 2016-11-11 DIAGNOSIS — M6281 Muscle weakness (generalized): Secondary | ICD-10-CM

## 2016-11-11 DIAGNOSIS — R29818 Other symptoms and signs involving the nervous system: Secondary | ICD-10-CM

## 2016-11-11 DIAGNOSIS — R278 Other lack of coordination: Secondary | ICD-10-CM

## 2016-11-11 DIAGNOSIS — R2689 Other abnormalities of gait and mobility: Secondary | ICD-10-CM

## 2016-11-11 DIAGNOSIS — R251 Tremor, unspecified: Secondary | ICD-10-CM

## 2016-11-11 DIAGNOSIS — R2681 Unsteadiness on feet: Secondary | ICD-10-CM

## 2016-11-11 DIAGNOSIS — R293 Abnormal posture: Secondary | ICD-10-CM

## 2016-11-11 DIAGNOSIS — R29898 Other symptoms and signs involving the musculoskeletal system: Secondary | ICD-10-CM

## 2016-11-11 NOTE — Therapy (Signed)
[facility] [address] [place], [place], [address] Phone: [phone]   Fax:  [phone]  Occupational Therapy Treatment  Patient Details  Name: Stephanie Shaw MRN: [id number] Date of Birth: [birth year] Referring Provider: Dr. Wells Guiles [name]   Encounter Date: 11/11/2016      OT End of Session - 11/11/16 1050    Visit Number 3   Number of Visits 17   Date for OT Re-Evaluation 12/28/16   Authorization Type [employer], no visit limit, no auth needed.  G-code needed   Authorization - Visit Number 3   Authorization - Number of Visits 10   OT Start Time 1024   OT Stop Time 1112   OT Time Calculation (min) 48 min   Activity Tolerance Patient tolerated treatment well   Behavior During Therapy [facility] for tasks assessed/performed      Past Medical History:  Diagnosis Date  . Colles' fracture of right radius 03/05/2015  . DEGENERATIVE JOINT DISEASE, RIGHT HIP 02/16/2007  . Dupuytren's contracture   . Osteoarthritis of hip    right  . Osteoarthritis of left knee   . POSTMENOPAUSAL SYNDROME 02/16/2007  . PREMATURE ATRIAL CONTRACTIONS 02/16/2007  . S/P breast biopsy, left    two o'clock position - benign  . 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 Venous insufficiency     Past Surgical History:  Procedure Laterality Date  . BREAST BIOPSY Left   . CATARACT EXTRACTION, BILATERAL      There were no vitals filed for this visit.      Subjective Assessment - 11/11/16 1033    Subjective  reports questions about PWR! hand HEP.   Pertinent History possible Parkinsonism, hx of multiple falls (fx with 2 falls), degenerative joint disease R hip, a-fib, nontoxic multinodular goiter, hx of L humerus fx 2017, osteopenia, R wrist fx 2017   Limitations fall risk   Patient Stated Goals improve control and strength of LUE   Currently in Pain? No/denies            [facility] OT Assessment - 11/11/16 0001      Coordination   Box  and Blocks R-36 blocks, L-31blocks        PWR! Moves (basic 4) in supine x 15-20 each with min cues For incr movement amplitude.  Reviewed PWR! Hands (basic 4) x10 each with min cueing for technique and incr movement amplitude   Coordination Activities with focus on large amplitude movements--pt performed with each hand:  Flipping cards, dealing cards with thumb, sliding cards across table using PWR! Hands.  All with min-mod cueing for incr movement amplitude.    Continued instruction in importance and  in use of large amplitude movements to prevent future complications related to PD including use of PWR! Hands/large amplitude movement strategies to grasp/release objects, open containers, turn pages, and separate papers, sit>stand at table (pushing chair out and forward lean), supine>sit.  Pt verbalized understanding.   Functional mobility:   Sit>stand with min cues for large amplitude movement technique including scooting away from table prior to standing.  Reviewed Supine>sitting with min cues for use of large amplitude movement strategy to incr ease and decr stress on back to prevent pain.  Reviewed importance in prevention of future complications.  Pt verbalized understanding and returned demo.                         OT Short Term Goals - 11/11/16 1123  OT SHORT TERM GOAL #1   Title Pt will be independent with HEP.--check STGs 11/28/16   Time 4   Period Weeks   Status New     OT SHORT TERM GOAL #2   Title Pt will demo large amplitude movement strategies during transitional movements (bending, reaching, carrying objects, turning) with only occasional min v.c.   Time 4   Period Weeks   Status New     OT SHORT TERM GOAL #3   Title Pt will report improved ability to wash hair with LUE.   Time 4   Period Weeks   Status New     OT SHORT TERM GOAL #4   Title Pt will be able to write at least 3 sentences with only mild micrographia.   Time 4   Period  Weeks   Status New     OT SHORT TERM GOAL #5   Title Pt will verbalize understanding of ways to prevent future complications and appropriate community resources.   Time 4   Period Weeks   Status New     OT SHORT TERM GOAL #6   Title Pt will improve functional reaching/coordination as shown by improving score on box and blocks test by at least 4 bilaterally.   Baseline R-36, L-31 blocks   Time 4   Period Weeks   Status New           OT Long Term Goals - 11/11/16 1124      OT LONG TERM GOAL #1   Title Pt will verbalize understanding of adative strategies/AE to incr safety/ease with ADLs/IADLs.--check LTGs 12/28/16   Time 8   Period Weeks   Status New     OT LONG TERM GOAL #2   Title Pt will improve L hand coordination for ADLs as shown by improving time on 9-hole peg test by at least 5sec.   Baseline 34.06sec   Time 8   Period Weeks   Status New     OT LONG TERM GOAL #3   Title Pt will improve balance for IADLs as shown by improving standing functional reach to at least 10" bilaterally.   Baseline R-8', L-8.5"   Time 8   Period Weeks   Status New     OT LONG TERM GOAL #4   Title Pt will improve L grip strength by least 5lbs to assist with holding objects/opening containers.   Baseline R-35lbs, L-20lbs   Time 8   Period Weeks   Status New     OT LONG TERM GOAL #5   Title Pt will improve PPT#4 by at least 5 sec for incr ease with dressing.   Baseline 24.31sec   Time 8   Period Weeks   Status New     OT LONG TERM GOAL #6   Title Pt will be able to fasten/unfasten 3 buttons in less than 37sec for incr ease with dressing.   Baseline 45.60   Time 8   Period Weeks   Status New     OT LONG TERM GOAL #7   Title Pt will improve functional reaching/coordination as shown by improving score on box and blocks test by at least 8 bilaterally.   Baseline R-36, L-31 blocks   Time 8   Period Weeks   Status New               Plan - 11/11/16 1050    Clinical  Impression Statement Pt reports performing HEP at home, but needed review  to perform PWR! hands correctly and for incr movement amplitude.  Pt with decr awareness of movement patterns/bradykinesia and will continue to need cueing and education regarding this and how it affects ADLs.     Rehab Potential Good   OT Frequency 2x / week   OT Duration 8 weeks  +eval   OT Treatment/Interventions Self-care/ADL training;Cryotherapy;Parrafin;Therapeutic exercise;DME and/or AE instruction;Therapist, nutritional;Therapeutic activities;Patient/family education;Balance training;Manual Therapy;Neuromuscular education;Fluidtherapy;Ultrasound;Moist Heat;Energy conservation;Passive range of motion;Therapeutic exercises   Plan coordination HEP (including sliding cards across table using PWR! hands); reinforce sit>stand from table with large amplitude movement strategies   OT Home Exercise Plan Education provided:  PWR! moves in supine; PWR! hands (basic 4)   Consulted and Agree with Plan of Care Patient      Patient will benefit from skilled therapeutic intervention in order to improve the following deficits and impairments:  Decreased balance, Decreased mobility, Impaired tone, Decreased range of motion, Decreased activity tolerance, Decreased knowledge of precautions, Decreased coordination, Decreased knowledge of use of DME, Decreased safety awareness, Decreased strength, Impaired UE functional use, Improper body mechanics, Improper spinal/pelvic alignment (bradykinesia)  Visit Diagnosis: Other symptoms and signs involving the nervous system  Other symptoms and signs involving the musculoskeletal system  Abnormal posture  Unsteadiness on feet  Other lack of coordination  Other abnormalities of gait and mobility  Tremor  Weakness of extremity    Problem List Patient Active Problem List   Diagnosis Date Noted  . Parkinsonism ([place]) 10/17/2016  . Fatigue 10/17/2016  . Long term (current) use  of anticoagulants 05/29/2016  . Left wrist pain 01/03/2016  . Left hand pain 01/03/2016  . Vaginal atrophy 10/19/2015  . Tremor, unspecified 10/19/2015  . Osteopenia 10/19/2015  . 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 Closed fracture of part of upper end of humerus 05/01/2015  . Hematuria 02/04/2014  . 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 Cervical pain (neck) 06/20/2010  . Atrial fibrillation ([place]) 05/21/2010  . 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 Menopausal and postmenopausal disorder 02/16/2007  . DEGENERATIVE JOINT DISEASE, RIGHT HIP 02/16/2007    [facility] 11/11/2016, 11:25 AM  [facility] [address] [place], [place], [address] Phone: [phone]   Fax:  [phone]  Name: JESSICA CHECKETTS MRN: [id number] Date of Birth: 02-12-1940   Vianne Bulls, OTR/L [facility] [address] [place], [place]  [address] [phone] phone [phone] 11/11/16 11:25 AM

## 2016-11-11 NOTE — Therapy (Addendum)
[facility] [address] [place], [place], [address] Phone: [phone]   Fax:  [phone]  Physical Therapy Treatment  Patient Details  Name: Stephanie Shaw MRN: [id number] Date of Birth: [birth year] Referring Provider: Alonza Bogus, DO  Encounter Date: 11/11/2016      PT End of Session - 11/11/16 1339    Visit Number 10   Number of Visits 16   Date for PT Re-Evaluation 12/07/16   Authorization Type G code every 10th visit   PT Start Time 0934   PT Stop Time 1015   PT Time Calculation (min) 41 min   Activity Tolerance Patient tolerated treatment well   Behavior During Therapy [facility] for tasks assessed/performed      Past Medical History:  Diagnosis Date  . Colles' fracture of right radius 03/05/2015  . DEGENERATIVE JOINT DISEASE, RIGHT HIP 02/16/2007  . Dupuytren's contracture   . Osteoarthritis of hip    right  . Osteoarthritis of left knee   . POSTMENOPAUSAL SYNDROME 02/16/2007  . PREMATURE ATRIAL CONTRACTIONS 02/16/2007  . S/P breast biopsy, left    two o'clock position - benign  . 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 Venous insufficiency     Past Surgical History:  Procedure Laterality Date  . BREAST BIOPSY Left   . CATARACT EXTRACTION, BILATERAL      There were no vitals filed for this visit.      Subjective Assessment - 11/11/16 0935    Subjective No changes since last visit.   Pertinent History a-fib, cateract surgery   Patient Stated Goals "I'd like to get my motion back in my left side, try to help tremors."   Currently in Pain? No/denies            [facility] PT Assessment - 11/11/16 0936      Standardized Balance Assessment   Standardized Balance Assessment Berg Balance Test;Timed Up and Go Test     Berg Balance Test   Sit to Stand Able to stand without using hands and stabilize independently   Standing Unsupported Able to stand safely 2 minutes   Sitting with Back Unsupported  but Feet Supported on Floor or Stool Able to sit safely and securely 2 minutes   Stand to Sit Sits safely with minimal use of hands   Transfers Able to transfer safely, minor use of hands   Standing Unsupported with Eyes Closed Able to stand 10 seconds safely   Standing Ubsupported with Feet Together Able to place feet together independently and stand 1 minute safely   From Standing, Reach Forward with Outstretched Arm Can reach confidently >25 cm (10")   From Standing Position, Pick up Object from Floor Able to pick up shoe safely and easily   From Standing Position, Turn to Look Behind Over each Shoulder Looks behind from both sides and weight shifts well   Turn 360 Degrees Able to turn 360 degrees safely but slowly   Standing Unsupported, Alternately Place Feet on Step/Stool Able to stand independently and safely and complete 8 steps in 20 seconds   Standing Unsupported, One Foot in Front Able to plae foot ahead of the other independently and hold 30 seconds   Standing on One Leg Tries to lift leg/unable to hold 3 seconds but remains standing independently   Total Score 50   Berg comment: Score 50/56, improved from 44/56     Timed Up and Go Test   Normal TUG (seconds) 14.78   Cognitive  TUG (seconds) 14.5     Functional Gait  Assessment   Gait assessed  Yes   Gait Level Surface Walks 20 ft in less than 7 sec but greater than 5.5 sec, uses assistive device, slower speed, mild gait deviations, or deviates 6-10 in outside of the 12 in walkway width.  5.72   Change in Gait Speed Able to smoothly change walking speed without loss of balance or gait deviation. Deviate no more than 6 in outside of the 12 in walkway width.   Gait with Horizontal Head Turns Performs head turns smoothly with no change in gait. Deviates no more than 6 in outside 12 in walkway width   Gait with Vertical Head Turns Performs head turns with no change in gait. Deviates no more than 6 in outside 12 in walkway width.    Gait and Pivot Turn Pivot turns safely within 3 sec and stops quickly with no loss of balance.   Step Over Obstacle Is able to step over one shoe box (4.5 in total height) without changing gait speed. No evidence of imbalance.   Gait with Narrow Base of Support Ambulates 4-7 steps.   Gait with Eyes Closed Walks 20 ft, uses assistive device, slower speed, mild gait deviations, deviates 6-10 in outside 12 in walkway width. Ambulates 20 ft in less than 9 sec but greater than 7 sec.   Ambulating Backwards Walks 20 ft, uses assistive device, slower speed, mild gait deviations, deviates 6-10 in outside 12 in walkway width.   Steps Alternating feet, must use rail.   Total Score 23   FGA comment: improved from 22/30                     [facility] Adult PT Treatment/Exercise - 11/11/16 0936      Transfers   Transfers Sit to Stand;Stand to Sit   Sit to Stand 5: Supervision;Without upper extremity assist;From bed;From chair/3-in-1   Five time sit to stand comments  20.19   Stand to Sit 5: Supervision;Without upper extremity assist;To chair/3-in-1;To bed   Transfer Cueing Practiced sit<>stand in clinic, from 14" surface with BOSU on top, 2 sets x 5 reps, with cues for rocking 3x prior to stand, with improved ease of standing noted.   Comments Martin Majestic out to patient's car and practiced car transfer, x 3 reps, with cues for increased amplitude of movement for improved L foot clearance.  Advised patient and patient practiced using threshold of door as target to lift foot easier into car.  Practiced sit<>stand from driver seat of car, x 4 reps, with cues for increased lean, increased momentum for transfer.                  PT Short Term Goals - 11/03/16 0809      PT SHORT TERM GOAL #1   Title The patient will be independent with home exercise program for large amplitude, weight shifting to improve functional mobility.   Time 4   Period Weeks   Status Achieved     PT SHORT TERM GOAL #2    Title The patient will be further assessed on 5x sit to stand and LTG to follow if indicated.     Baseline 9/26  17.53 seconds; 16.56 sec 10/29/16   Time 4   Period Weeks   Status Achieved     PT SHORT TERM GOAL #3   Title The patient will improve FGA to 20/30 to demonstrate improving dynamic gait activities.  Baseline 16/30   Time 4   Period Weeks   Status Achieved     PT SHORT TERM GOAL #4   Title The patient will improve backwards walking gait speed to > or equal to 1.8 ft/sec.   Baseline 1.42 ft/sec   Time 4   Period Weeks   Status Achieved     PT SHORT TERM GOAL #5   Title The patient will verbalize understanding of community resources for Parkinson's disease (including power over parkinson's and group exercise classes).   Baseline Has ben provided with POP info, will provide exercise class info as appropriate when approaching d/c   Time 4   Period Weeks   Status On-going           PT Long Term Goals - 11/11/16 0935      PT LONG TERM GOAL #1   Title The patinet will be indep with HEP for post d/c progression of activities.   Time 8   Period Weeks     PT LONG TERM GOAL #2   Title 5 time sit<>stand goal to follow, if indicated. 9/26 Goal for <16.0 seconds (cut-off score for increased fall risk in Parkinson patients)   Baseline 9/26 17.53   Time 8   Period Weeks   Status Not Met     PT LONG TERM GOAL #3   Title The patient will improve Berg balance test to > or equal to 48/56 to demo dec'd risk for falls.   Baseline Berg=44/56   Time 8   Period Weeks   Status Achieved     PT LONG TERM GOAL #4   Title The patient will improve FGA to > or equal to 23/30 to demo improving dynamic gait.   Baseline FGA=16/30   Time 8   Status Achieved     PT LONG TERM GOAL #5   Title The patient will demonstrate car transfer from her [facility] to demo safety with functional transfers.   Time 8   Period Weeks   Status Achieved     PT LONG TERM GOAL #6   Title The  patient will return to unlevel surface ambulation in order to return to level, wide path trails in order to participate in prior recreational activities.   Baseline patient unable to hike, participate in prior outdoor activities   Time 8   Period Weeks               Plan - 11/11/16 1340    Clinical Impression Statement Assessed LTGs this visit, as patient would like to plan to d/c from PT this week.  She has met LTG 3, 4, 5 for improved Berg, Functional Gait and car transfers.  Pt has demonstrated functional improvements, but is unsure that it is carrying over to daily activities.  Pt will likely discharge next visit.   Rehab Potential Good   Clinical Impairments Affecting Rehab Potential Enhancers to rehab potential are:  motivation, prior fitness level, engagement of patient.     PT Frequency 2x / week   PT Duration 8 weeks   PT Treatment/Interventions ADLs/Self Care Home Management;Therapeutic activities;Therapeutic exercise;Balance training;Neuromuscular re-education;Gait training;Stair training;Functional mobility training;Patient/family education;Manual techniques   PT Next Visit Plan Check HEP and plan for d/c next visit   PT Home Exercise Plan sit<>stand, hamstring stretch; PWR Up & Rock (pt is performing PWR! Twist and step in standing-educated to have UE support for stepping)   Consulted and Agree with Plan of Care Patient  Patient will benefit from skilled therapeutic intervention in order to improve the following deficits and impairments:  Abnormal gait, Decreased balance, Decreased strength, Decreased coordination, Difficulty walking, Postural dysfunction, Decreased mobility (bradykinesia, tremors)  Visit Diagnosis: Other symptoms and signs involving the nervous system  Unsteadiness on feet  Muscle weakness (generalized)   Addendum: G Codes Functional Assessment Tool: Berg 50/56, gait velocity 1.9 ft/sec, 5x sit<>stand >20 seconds Functional Limitation:   Mobility: Walking and Moving Around Current Status (K2706): CJ Goal Status  (C3762): CJ   Problem List Patient Active Problem List   Diagnosis Date Noted  . Parkinsonism ([place]) 10/17/2016  . Fatigue 10/17/2016  . Long term (current) use of anticoagulants 05/29/2016  . Left wrist pain 01/03/2016  . Left hand pain 01/03/2016  . Vaginal atrophy 10/19/2015  . Tremor, unspecified 10/19/2015  . Osteopenia 10/19/2015  . 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 Closed fracture of part of upper end of humerus 05/01/2015  . Hematuria 02/04/2014  . 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 Cervical pain (neck) 06/20/2010  . Atrial fibrillation ([place]) 05/21/2010  . 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 Menopausal and postmenopausal disorder 02/16/2007  . DEGENERATIVE JOINT DISEASE, RIGHT HIP 02/16/2007    Johnita Palleschi W. 11/11/2016, 1:45 PM  Frazier Butt., PT   [facility] [address] [place], [place], [address] Phone: [phone]   Fax:  [phone]  Name: Stephanie Shaw MRN: [id number] Date of Birth: Oct 28, 1940  Mady Haagensen, PT 11/13/16 11:21 AM Phone: [phone] Fax: [phone]  (Note addended Dareen Piano-)  Physical Therapy Progress Note  Dates of Reporting Period: 10/08/16 to 11/11/16  Objective Reports of Subjective Statement: Improved Berg and FGA scores  Objective Measurements: Berg 50/56, FGA 23/30, gait velocity 1.9 ft/sec, 5x sit<>stand 20.19 sec  Goal Update: Pt has met LTG 2,3, 4.  Plan on checking remaining LTGs next visit.  Pt on track for HEP goal, but likely will not be able to return to unlevel, unpaved hiking trails due to safety concerns.  Plan: Check remaining goals, with plans to discharge next visit.  Reason Skilled Services are Required: Pt has benefitted from education in HEP and strategies for improved movement patterns with ADLs.  However, pt has difficulty with  sequencing and technique of PWR! Moves exercises (likely not a good candidate for PWR! Moves class).  Will review HEP and plan for d/c next visit.  Mady Haagensen, PT 11/11/16 1:49 PM Phone: [phone] Fax: [phone]

## 2016-11-12 ENCOUNTER — Ambulatory Visit: Payer: Medicare Other | Admitting: Occupational Therapy

## 2016-11-12 ENCOUNTER — Ambulatory Visit: Payer: Medicare Other | Admitting: Physical Therapy

## 2016-11-12 ENCOUNTER — Other Ambulatory Visit: Payer: Self-pay | Admitting: Family Medicine

## 2016-11-12 DIAGNOSIS — R29898 Other symptoms and signs involving the musculoskeletal system: Secondary | ICD-10-CM

## 2016-11-12 DIAGNOSIS — M6281 Muscle weakness (generalized): Secondary | ICD-10-CM

## 2016-11-12 DIAGNOSIS — R29818 Other symptoms and signs involving the nervous system: Secondary | ICD-10-CM

## 2016-11-12 DIAGNOSIS — R2689 Other abnormalities of gait and mobility: Secondary | ICD-10-CM

## 2016-11-12 DIAGNOSIS — Z139 Encounter for screening, unspecified: Secondary | ICD-10-CM

## 2016-11-12 DIAGNOSIS — R2681 Unsteadiness on feet: Secondary | ICD-10-CM

## 2016-11-12 NOTE — Therapy (Signed)
[facility] [address] [place], [place], [address] Phone: [phone]   Fax:  [phone]  Occupational Therapy Treatment  Patient Details  Name: Stephanie Shaw MRN: [id number] Date of Birth: [birth year] Referring Provider: Dr. Wells Guiles [name]   Encounter Date: 11/12/2016      OT End of Session - 11/12/16 0835    Visit Number 4   Number of Visits 17   Date for OT Re-Evaluation 12/28/16   Authorization Type [employer], no visit limit, no auth needed.  G-code needed   Authorization - Visit Number 4   Authorization - Number of Visits 10   OT Start Time 0805   OT Stop Time 0845   OT Time Calculation (min) 40 min   Activity Tolerance Patient tolerated treatment well   Behavior During Therapy WFL for tasks assessed/performed      Past Medical History:  Diagnosis Date  . Colles' fracture of right radius 03/05/2015  . DEGENERATIVE JOINT DISEASE, RIGHT HIP 02/16/2007  . Dupuytren's contracture   . Osteoarthritis of hip    right  . Osteoarthritis of left knee   . POSTMENOPAUSAL SYNDROME 02/16/2007  . PREMATURE ATRIAL CONTRACTIONS 02/16/2007  . S/P breast biopsy, left    two o'clock position - benign  . 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 Venous insufficiency     Past Surgical History:  Procedure Laterality Date  . BREAST BIOPSY Left   . CATARACT EXTRACTION, BILATERAL      There were no vitals filed for this visit.      Subjective Assessment - 11/12/16 0811    Pertinent History possible Parkinsonism, hx of multiple falls (fx with 2 falls), degenerative joint disease R hip, a-fib, nontoxic multinodular goiter, hx of L humerus fx 2017, osteopenia, R wrist fx 2017   Patient Stated Goals improve control and strength of LUE   Currently in Pain? No/denies                              OT Education - 11/12/16 1306    Education provided Yes   Education Details PWR! hands, coordination HEP    Person(s) Educated Patient   Methods Explanation;Demonstration;Verbal cues;Handout   Comprehension Verbalized understanding;Returned demonstration;Verbal cues required          OT Short Term Goals - 11/11/16 1123      OT SHORT TERM GOAL #1   Title Pt will be independent with HEP.--check STGs 11/28/16   Time 4   Period Weeks   Status New     OT SHORT TERM GOAL #2   Title Pt will demo large amplitude movement strategies during transitional movements (bending, reaching, carrying objects, turning) with only occasional min v.c.   Time 4   Period Weeks   Status New     OT SHORT TERM GOAL #3   Title Pt will report improved ability to wash hair with LUE.   Time 4   Period Weeks   Status New     OT SHORT TERM GOAL #4   Title Pt will be able to write at least 3 sentences with only mild micrographia.   Time 4   Period Weeks   Status New     OT SHORT TERM GOAL #5   Title Pt will verbalize understanding of ways to prevent future complications and appropriate community resources.   Time 4   Period Weeks   Status New  OT SHORT TERM GOAL #6   Title Pt will improve functional reaching/coordination as shown by improving score on box and blocks test by at least 4 bilaterally.   Baseline R-36, L-31 blocks   Time 4   Period Weeks   Status New           OT Long Term Goals - 11/11/16 1124      OT LONG TERM GOAL #1   Title Pt will verbalize understanding of adative strategies/AE to incr safety/ease with ADLs/IADLs.--check LTGs 12/28/16   Time 8   Period Weeks   Status New     OT LONG TERM GOAL #2   Title Pt will improve L hand coordination for ADLs as shown by improving time on 9-hole peg test by at least 5sec.   Baseline 34.06sec   Time 8   Period Weeks   Status New     OT LONG TERM GOAL #3   Title Pt will improve balance for IADLs as shown by improving standing functional reach to at least 10" bilaterally.   Baseline R-8', L-8.5"   Time 8   Period Weeks   Status  New     OT LONG TERM GOAL #4   Title Pt will improve L grip strength by least 5lbs to assist with holding objects/opening containers.   Baseline R-35lbs, L-20lbs   Time 8   Period Weeks   Status New     OT LONG TERM GOAL #5   Title Pt will improve PPT#4 by at least 5 sec for incr ease with dressing.   Baseline 24.31sec   Time 8   Period Weeks   Status New     OT LONG TERM GOAL #6   Title Pt will be able to fasten/unfasten 3 buttons in less than 37sec for incr ease with dressing.   Baseline 45.60   Time 8   Period Weeks   Status New     OT LONG TERM GOAL #7   Title Pt will improve functional reaching/coordination as shown by improving score on box and blocks test by at least 8 bilaterally.   Baseline R-36, L-31 blocks   Time 8   Period Weeks   Status New               Plan - 11/12/16 1306    Clinical Impression Statement Pt is progressing towards goals. she requires repetition and v.c for big movements with functional activity.   Rehab Potential Good   OT Frequency 2x / week   OT Duration 8 weeks   OT Treatment/Interventions Self-care/ADL training;Cryotherapy;Parrafin;Therapeutic exercise;DME and/or AE instruction;Therapist, nutritional;Therapeutic activities;Patient/family education;Balance training;Manual Therapy;Neuromuscular education;Fluidtherapy;Ultrasound;Moist Heat;Energy conservation;Passive range of motion;Therapeutic exercises   Plan cehck on coordination HEP, reinforce sit/ stand with big movements   OT Home Exercise Plan Education provided:  PWR! moves in supine; PWR! hands (basic 4)   Consulted and Agree with Plan of Care Patient      Patient will benefit from skilled therapeutic intervention in order to improve the following deficits and impairments:  Decreased balance, Decreased mobility, Impaired tone, Decreased range of motion, Decreased activity tolerance, Decreased knowledge of precautions, Decreased coordination, Decreased knowledge of use  of DME, Decreased safety awareness, Decreased strength, Impaired UE functional use, Improper body mechanics, Improper spinal/pelvic alignment  Visit Diagnosis: Other symptoms and signs involving the nervous system  Unsteadiness on feet  Muscle weakness (generalized)  Other symptoms and signs involving the musculoskeletal system    Problem List Patient Active Problem  List   Diagnosis Date Noted  . Parkinsonism ([place]) 10/17/2016  . Fatigue 10/17/2016  . Long term (current) use of anticoagulants 05/29/2016  . Left wrist pain 01/03/2016  . Left hand pain 01/03/2016  . Vaginal atrophy 10/19/2015  . Tremor, unspecified 10/19/2015  . Osteopenia 10/19/2015  . 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 Closed fracture of part of upper end of humerus 05/01/2015  . Hematuria 02/04/2014  . 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 Cervical pain (neck) 06/20/2010  . Atrial fibrillation ([place]) 05/21/2010  . 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 Menopausal and postmenopausal disorder 02/16/2007  . DEGENERATIVE JOINT DISEASE, RIGHT HIP 02/16/2007    Mckale Haffey 11/12/2016, 1:07 PM  [facility] [address] [place], [place], [address] Phone: [phone]   Fax:  [phone]  Name: CHEYEANNE ROADCAP MRN: [id number] Date of Birth: [birth year]

## 2016-11-12 NOTE — Patient Instructions (Signed)
Coordination Exercises  Perform the following exercises for 20 minutes 1 times per day. Perform with both hand(s). Perform using big movements.   Flipping Cards: Place deck of cards on the table. Flip cards over by opening your hand big to grasp and then turn your palm up big.  Deal cards: Hold 1/2 or whole deck in your hand. Use thumb to push card off top of deck with one big push.  Flick cards across the tabletop by extending fingers,   Rotate ball with fingertips: Pick up with fingers/thumb and move as much as you can with each turn/movement (clockwise and counter-clockwise).  Pick up coins and stack one at a time: Pick up with big, intentional movements. Do not drag coin to the edge. (5-10 in a stack)  Pick up 5-10 coins one at a time and hold in palm. Then, move coins from palm to fingertips one at time and place in coin bank/container.  Practice writing: Slow down, write big, and focus on forming each letter.  Perform "Flicks"/hand stretches (PWR! Hands): Close hands then flick out your fingers with focus on opening hands, pulling wrists back, and extending elbows like you are pushing.

## 2016-11-13 DIAGNOSIS — R2689 Other abnormalities of gait and mobility: Secondary | ICD-10-CM | POA: Diagnosis not present

## 2016-11-13 NOTE — Therapy (Signed)
[facility] [address] [place], [place], [address] Phone: [phone]   Fax:  [phone]  Physical Therapy Treatment  Patient Details  Name: Stephanie Shaw MRN: [id number] Date of Birth: [birth year] Referring Provider: Alonza Bogus, DO  Encounter Date: 11/12/2016      PT End of Session - 11/13/16 0849    Visit Number 11   Number of Visits 16   Date for PT Re-Evaluation 12/07/16   Authorization Type G code every 10th visit   PT Start Time 0853   PT Stop Time 0921  d/c date, goals checked and session finished early   PT Time Calculation (min) 28 min   Activity Tolerance Patient tolerated treatment well   Behavior During Therapy [facility] for tasks assessed/performed      Past Medical History:  Diagnosis Date  . Colles' fracture of right radius 03/05/2015  . DEGENERATIVE JOINT DISEASE, RIGHT HIP 02/16/2007  . Dupuytren's contracture   . Osteoarthritis of hip    right  . Osteoarthritis of left knee   . POSTMENOPAUSAL SYNDROME 02/16/2007  . PREMATURE ATRIAL CONTRACTIONS 02/16/2007  . S/P breast biopsy, left    two o'clock position - benign  . 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 Venous insufficiency     Past Surgical History:  Procedure Laterality Date  . BREAST BIOPSY Left   . CATARACT EXTRACTION, BILATERAL      There were no vitals filed for this visit.      Subjective Assessment - 11/12/16 0858    Subjective No changes since yesterday's PT visit.   Pertinent History a-fib, cateract surgery   Patient Stated Goals "I'd like to get my motion back in my left side, try to help tremors."   Currently in Pain? No/denies                         [facility] Adult PT Treatment/Exercise - 11/13/16 0001      Transfers   Comments Review of sit<>stand transfers, from 20" and 18" mat/chair surface, x 5 reps each     Ambulation/Gait   Ambulation/Gait Yes   Ambulation/Gait Assistance 7: Independent    Ambulation Distance (Feet) 750 Feet   Assistive device None   Gait Pattern Decreased arm swing - right;Decreased arm swing - left;Decreased trunk rotation;Narrow base of support;Decreased stride length;Right foot flat;Left foot flat;Right flexed knee in stance;Left flexed knee in stance   Ambulation Surface Level;Indoor;Unlevel;Outdoor;Paved   Pre-Gait Activities Outdoor gait on sidewalks, with conversation tasks; reminder cues occaisonally for improved foot clearance, posture;  pt reports walking 30 minutes, almost daily on paved railway trails.     Self-Care   Self-Care Other Self-Care Comments   Other Self-Care Comments  Discussed progress towards goals, community fitness options, which pt verbalizes understanding of [employer], [place]! Moves exercise class and cycling class at [facility].  Verbalizes plans to observe PWR! Moves class.  Discussed plans for d/c this visit and likely return screen or eval in 6-9 months (to be scheduled when pt d/c from OT)     Knee/Hip Exercises: Stretches   Active Hamstring Stretch Right;Left;2 reps;30 seconds  Review of seated hamstring stretch for HEP           PWR [facility]) - 11/12/16 0901    PWR! exercises Moves in standing   PWR! Up x 10 reps   PWR! Rock x 10 reps each side   PWR! Twist x 10 reps each side  PWR! Step x 10 reps each side   Comments Review of HEP-pt return demo understanding          Balance Exercises - 11/12/16 0858      Balance Exercises: Standing   Other Standing Exercises Reviewed HEP:  standing on pillows feet apart, feeto together, head turns/nods EO, then EC x 10 seconds           PT Education - 11/13/16 0848    Education provided Yes   Education Details REviewed community fitness, plans for discharge, and follow-up in 6-9 months   Person(s) Educated Patient   Methods Explanation   Comprehension Verbalized understanding          PT Short Term Goals - 11/03/16 0809      PT SHORT TERM GOAL #1   Title  The patient will be independent with home exercise program for large amplitude, weight shifting to improve functional mobility.   Time 4   Period Weeks   Status Achieved     PT SHORT TERM GOAL #2   Title The patient will be further assessed on 5x sit to stand and LTG to follow if indicated.     Baseline 9/26  17.53 seconds; 16.56 sec 10/29/16   Time 4   Period Weeks   Status Achieved     PT SHORT TERM GOAL #3   Title The patient will improve FGA to 20/30 to demonstrate improving dynamic gait activities.   Baseline 16/30   Time 4   Period Weeks   Status Achieved     PT SHORT TERM GOAL #4   Title The patient will improve backwards walking gait speed to > or equal to 1.8 ft/sec.   Baseline 1.42 ft/sec   Time 4   Period Weeks   Status Achieved     PT SHORT TERM GOAL #5   Title The patient will verbalize understanding of community resources for Parkinson's disease (including power over parkinson's and group exercise classes).   Baseline Has ben provided with POP info, will provide exercise class info as appropriate when approaching d/c   Time 4   Period Weeks   Status On-going           PT Long Term Goals - 11/13/16 3086      PT LONG TERM GOAL #1   Title The patinet will be indep with HEP for post d/c progression of activities.   Time 8   Period Weeks   Status Achieved     PT LONG TERM GOAL #2   Title 5 time sit<>stand goal to follow, if indicated. 9/26 Goal for <16.0 seconds (cut-off score for increased fall risk in Parkinson patients)   Baseline 9/26 17.53   Time 8   Period Weeks   Status Not Met     PT LONG TERM GOAL #3   Title The patient will improve Berg balance test to > or equal to 48/56 to demo dec'd risk for falls.   Baseline Berg=44/56   Time 8   Period Weeks   Status Achieved     PT LONG TERM GOAL #4   Title The patient will improve FGA to > or equal to 23/30 to demo improving dynamic gait.   Baseline FGA=16/30   Time 8   Status Achieved     PT  LONG TERM GOAL #5   Title The patient will demonstrate car transfer from her [facility] to demo safety with functional transfers.   Time 8  Period Weeks   Status Achieved     PT LONG TERM GOAL #6   Title The patient will return to unlevel surface ambulation in order to return to level, wide path trails in order to participate in prior recreational activities.   Baseline patient unable to hike, participate in prior outdoor activities   Time 8   Period Weeks   Status Achieved               Plan - 2016-11-17 1055    Clinical Impression Statement Continued to assess remaining LTGs-pt has met LTG 1, 3, 4, 5, 6.  LTG 2 not met for 5x sit<>stand.  Pt feels comfortable with exercises, but doesn't note any significant improvements in her balance with daily activities.  She verbalizes understanding of community fitness options, and is appropriate for discharge at this time.   Rehab Potential Good   Clinical Impairments Affecting Rehab Potential Enhancers to rehab potential are:  motivation, prior fitness level, engagement of patient.     PT Frequency 2x / week   PT Duration 8 weeks   PT Treatment/Interventions ADLs/Self Care Home Management;Therapeutic activities;Therapeutic exercise;Balance training;Neuromuscular re-education;Gait training;Stair training;Functional mobility training;Patient/family education;Manual techniques   PT Next Visit Plan Discharge this visit.   PT Home Exercise Plan sit<>stand, hamstring stretch; PWR Up & Rock (pt is performing PWR! Twist and step in standing-educated to have UE support for stepping)   Consulted and Agree with Plan of Care Patient      Patient will benefit from skilled therapeutic intervention in order to improve the following deficits and impairments:  Abnormal gait, Decreased balance, Decreased strength, Decreased coordination, Difficulty walking, Postural dysfunction, Decreased mobility (bradykinesia, tremors)  Visit Diagnosis: Other  abnormalities of gait and mobility  Unsteadiness on feet  Other symptoms and signs involving the nervous system       G-Codes - 11-17-2016 1108    Functional Assessment Tool Used (Outpatient Only) FGA 23/30, 5x :  20.19 sec, Berg 50/56, 1.9 ft/sec; pt reports walking 30 minutes on paved trails (for g-code/discharge date 11/12/16)   Functional Limitation Mobility: Walking and moving around   Mobility: Walking and Moving Around Goal Status [phone]) At least 20 percent but less than 40 percent impaired, limited or restricted   Mobility: Walking and Moving Around Discharge Status [phone]) At least 20 percent but less than 40 percent impaired, limited or restricted      Problem List Patient Active Problem List   Diagnosis Date Noted  . Parkinsonism ([place]) 10/17/2016  . Fatigue 10/17/2016  . Long term (current) use of anticoagulants 05/29/2016  . Left wrist pain 01/03/2016  . Left hand pain 01/03/2016  . Vaginal atrophy 10/19/2015  . Tremor, unspecified 10/19/2015  . Osteopenia 10/19/2015  . 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 Closed fracture of part of upper end of humerus 05/01/2015  . Hematuria 02/04/2014  . 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 Cervical pain (neck) 06/20/2010  . Atrial fibrillation ([place]) 05/21/2010  . 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 Menopausal and postmenopausal disorder 02/16/2007  . DEGENERATIVE JOINT DISEASE, RIGHT HIP 02/16/2007    Frazier Butt. November 17, 2016, 11:27 AM Frazier Butt., PT  [facility] [address] [place], [place], [address] Phone: [phone]   Fax:  [phone]  Name: ATHALIAH BAUMBACH MRN: [id number] Date of Birth: 23-Dec-1940   PHYSICAL THERAPY DISCHARGE SUMMARY  Visits from Start of Care: 11  Current functional level related to goals / functional  outcomes: Pt has met 5 of 6 long term goals.   Remaining deficits: Balance, posture, gait    Education / Equipment: Pt has been educated in [employer], community fitness and Parkinson's-disease related resources.  Plan: Patient agrees to discharge.  Patient goals were partially met. Patient is being discharged due to meeting the stated rehab goals.  ?????Recommend return PT screen/eval in 6 months.  Mady Haagensen, PT 11/13/16 11:27 AM Phone: [phone] Fax: [phone]

## 2016-11-17 ENCOUNTER — Ambulatory Visit: Payer: Medicare Other | Admitting: Occupational Therapy

## 2016-11-17 DIAGNOSIS — R293 Abnormal posture: Secondary | ICD-10-CM

## 2016-11-17 DIAGNOSIS — R278 Other lack of coordination: Secondary | ICD-10-CM

## 2016-11-17 DIAGNOSIS — R2689 Other abnormalities of gait and mobility: Secondary | ICD-10-CM

## 2016-11-17 DIAGNOSIS — R2681 Unsteadiness on feet: Secondary | ICD-10-CM

## 2016-11-17 DIAGNOSIS — R29818 Other symptoms and signs involving the nervous system: Secondary | ICD-10-CM

## 2016-11-17 DIAGNOSIS — R29898 Other symptoms and signs involving the musculoskeletal system: Secondary | ICD-10-CM

## 2016-11-17 DIAGNOSIS — M6281 Muscle weakness (generalized): Secondary | ICD-10-CM

## 2016-11-17 NOTE — Therapy (Signed)
[facility] [address] [place], [place], [address] Phone: [phone]   Fax:  [phone]  Occupational Therapy Treatment  Patient Details  Name: Stephanie Shaw MRN: [id number] Date of Birth: 1940-12-04 Referring Provider: Dr. Wells Guiles [name]   Encounter Date: 11/17/2016      OT End of Session - 11/17/16 0904    Visit Number 5   Number of Visits 17   Date for OT Re-Evaluation 12/28/16   Authorization Type [employer], no visit limit, no auth needed.  G-code needed   Authorization - Visit Number 5   Authorization - Number of Visits 10   OT Start Time [phone]   OT Stop Time 0930   OT Time Calculation (min) 38 min   Activity Tolerance Patient tolerated treatment well   Behavior During Therapy WFL for tasks assessed/performed      Past Medical History:  Diagnosis Date  . Colles' fracture of right radius 03/05/2015  . DEGENERATIVE JOINT DISEASE, RIGHT HIP 02/16/2007  . Dupuytren's contracture   . Osteoarthritis of hip    right  . Osteoarthritis of left knee   . POSTMENOPAUSAL SYNDROME 02/16/2007  . PREMATURE ATRIAL CONTRACTIONS 02/16/2007  . S/P breast biopsy, left    two o'clock position - benign  . 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 Venous insufficiency     Past Surgical History:  Procedure Laterality Date  . BREAST BIOPSY Left   . CATARACT EXTRACTION, BILATERAL      There were no vitals filed for this visit.      Subjective Assessment - 11/17/16 0908    Subjective  feel shakey this weekend and today   Pertinent History possible Parkinsonism, hx of multiple falls (fx with 2 falls), degenerative joint disease R hip, a-fib, nontoxic multinodular goiter, hx of L humerus fx 2017, osteopenia, R wrist fx 2017   Limitations fall risk   Patient Stated Goals improve control and strength of LUE   Currently in Pain? No/denies       Reviewed coordination exercises with each hand emphasizing large amplitude  movements:  Dealing cards with thumb, flipping cards, flicking cards across table using PWR! Hands.  PWR! Moves (basic 4) in modified quadraped x 20 each with min-mod cues For incr movement amplitude, sequence/technique.  PWR! Step without PWR! Up.  Pt instructed in modified version as she is considering PWR! Moves class this week.  Min cueing for ambulate with heel strike vs. On toes.  Pt with difficulty correcting.                             OT Short Term Goals - 11/11/16 1123      OT SHORT TERM GOAL #1   Title Pt will be independent with HEP.--check STGs 11/28/16   Time 4   Period Weeks   Status New     OT SHORT TERM GOAL #2   Title Pt will demo large amplitude movement strategies during transitional movements (bending, reaching, carrying objects, turning) with only occasional min v.c.   Time 4   Period Weeks   Status New     OT SHORT TERM GOAL #3   Title Pt will report improved ability to wash hair with LUE.   Time 4   Period Weeks   Status New     OT SHORT TERM GOAL #4   Title Pt will be able to write at least 3 sentences with only  mild micrographia.   Time 4   Period Weeks   Status New     OT SHORT TERM GOAL #5   Title Pt will verbalize understanding of ways to prevent future complications and appropriate community resources.   Time 4   Period Weeks   Status New     OT SHORT TERM GOAL #6   Title Pt will improve functional reaching/coordination as shown by improving score on box and blocks test by at least 4 bilaterally.   Baseline R-36, L-31 blocks   Time 4   Period Weeks   Status New           OT Long Term Goals - 11/11/16 1124      OT LONG TERM GOAL #1   Title Pt will verbalize understanding of adative strategies/AE to incr safety/ease with ADLs/IADLs.--check LTGs 12/28/16   Time 8   Period Weeks   Status New     OT LONG TERM GOAL #2   Title Pt will improve L hand coordination for ADLs as shown by improving time on 9-hole peg  test by at least 5sec.   Baseline 34.06sec   Time 8   Period Weeks   Status New     OT LONG TERM GOAL #3   Title Pt will improve balance for IADLs as shown by improving standing functional reach to at least 10" bilaterally.   Baseline R-8', L-8.5"   Time 8   Period Weeks   Status New     OT LONG TERM GOAL #4   Title Pt will improve L grip strength by least 5lbs to assist with holding objects/opening containers.   Baseline R-35lbs, L-20lbs   Time 8   Period Weeks   Status New     OT LONG TERM GOAL #5   Title Pt will improve PPT#4 by at least 5 sec for incr ease with dressing.   Baseline 24.31sec   Time 8   Period Weeks   Status New     OT LONG TERM GOAL #6   Title Pt will be able to fasten/unfasten 3 buttons in less than 37sec for incr ease with dressing.   Baseline 45.60   Time 8   Period Weeks   Status New     OT LONG TERM GOAL #7   Title Pt will improve functional reaching/coordination as shown by improving score on box and blocks test by at least 8 bilaterally.   Baseline R-36, L-31 blocks   Time 8   Period Weeks   Status New               Plan - 11/17/16 0853    Rehab Potential Good   OT Frequency 2x / week   OT Duration 8 weeks   OT Treatment/Interventions Self-care/ADL training;Cryotherapy;Parrafin;Therapeutic exercise;DME and/or AE instruction;Therapist, nutritional;Therapeutic activities;Patient/family education;Balance training;Manual Therapy;Neuromuscular education;Fluidtherapy;Ultrasound;Moist Heat;Energy conservation;Passive range of motion;Therapeutic exercises   Plan large amplitude movement strategies for ADLs    OT Home Exercise Plan Education provided:  PWR! moves in supine; PWR! hands (basic 4)   Consulted and Agree with Plan of Care Patient      Patient will benefit from skilled therapeutic intervention in order to improve the following deficits and impairments:  Decreased balance, Decreased mobility, Impaired tone, Decreased range  of motion, Decreased activity tolerance, Decreased knowledge of precautions, Decreased coordination, Decreased knowledge of use of DME, Decreased safety awareness, Decreased strength, Impaired UE functional use, Improper body mechanics, Improper spinal/pelvic alignment  Visit Diagnosis: Other  symptoms and signs involving the nervous system  Other symptoms and signs involving the musculoskeletal system  Abnormal posture  Other abnormalities of gait and mobility  Unsteadiness on feet  Other lack of coordination  Muscle weakness (generalized)    Problem List Patient Active Problem List   Diagnosis Date Noted  . Parkinsonism ([place]) 10/17/2016  . Fatigue 10/17/2016  . Long term (current) use of anticoagulants 05/29/2016  . Left wrist pain 01/03/2016  . Left hand pain 01/03/2016  . Vaginal atrophy 10/19/2015  . Tremor, unspecified 10/19/2015  . Osteopenia 10/19/2015  . 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 Closed fracture of part of upper end of humerus 05/01/2015  . Hematuria 02/04/2014  . 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 Cervical pain (neck) 06/20/2010  . Atrial fibrillation ([place]) 05/21/2010  . 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 Menopausal and postmenopausal disorder 02/16/2007  . DEGENERATIVE JOINT DISEASE, RIGHT HIP 02/16/2007    [facility] 11/17/2016, 9:09 AM  [facility] [address] [place], [place], [address] Phone: [phone]   Fax:  [phone]  Name: VOLA BENEKE MRN: [id number] Date of Birth: Jan 24, 1940   Vianne Bulls, OTR/L [facility] [address] [place], [place]  [address] [phone] phone [phone] 11/17/16 9:25 AM

## 2016-11-20 ENCOUNTER — Ambulatory Visit: Payer: Medicare Other | Attending: Neurology | Admitting: Occupational Therapy

## 2016-11-20 DIAGNOSIS — R29818 Other symptoms and signs involving the nervous system: Secondary | ICD-10-CM | POA: Insufficient documentation

## 2016-11-20 DIAGNOSIS — R278 Other lack of coordination: Secondary | ICD-10-CM | POA: Diagnosis present

## 2016-11-20 DIAGNOSIS — R293 Abnormal posture: Secondary | ICD-10-CM | POA: Diagnosis present

## 2016-11-20 DIAGNOSIS — R251 Tremor, unspecified: Secondary | ICD-10-CM | POA: Insufficient documentation

## 2016-11-20 DIAGNOSIS — R29898 Other symptoms and signs involving the musculoskeletal system: Secondary | ICD-10-CM | POA: Insufficient documentation

## 2016-11-20 DIAGNOSIS — R2689 Other abnormalities of gait and mobility: Secondary | ICD-10-CM | POA: Diagnosis present

## 2016-11-20 DIAGNOSIS — M6281 Muscle weakness (generalized): Secondary | ICD-10-CM | POA: Diagnosis present

## 2016-11-20 DIAGNOSIS — R2681 Unsteadiness on feet: Secondary | ICD-10-CM

## 2016-11-20 NOTE — Patient Instructions (Addendum)
Performing Daily Activities with Big Movements  Pick at least 2 activities a day and perform with BIG, DELIBERATE movements/effort.  Try different activities each day. This can make the activity easier and turn daily activities into exercise to prevent problems in the future!  If you are standing during the activity, make sure to keep feet apart and stand with good/big/PWR! UP posture.  Examples:  Dressing - Push arms in sleeves, twist when putting on jacket (put on like a cape (hands in sleeves), take off by looking over shoulder 1 at a time., push foot into pants, open hands to pull down shirt/put on socks/pull up pants  Buttoning - Open hands big (PWR! Hands) before fastening each button  Bathing - Wash/dry with long strokes  Brushing your teeth - Big, slow movements  Cutting food - Long deliberate cuts  Eating - Hold utensil in the middle, not the end  Picking up a cup/bottle - Open hand up big and get object all the way in palm  Opening jar/bottle - Move as much as you can with each turn  Putting on seatbelt - Twist when reaching  Hanging up clothes/getting clothes down from closet - Reach with big effort  Putting away groceries/dishes - Reach with big effort  Wiping counter/table - Move in big, long strokes  Stirring while cooking - Exaggerate movement  Cleaning windows - Move in big, long strokes  Sweeping - Move arms in big, long strokes (feet apart with 1 slightly in front of the other and good posture)  Vacuuming - Push with big movement  Folding clothes - Exaggerate arm movements  Washing car - Move in big, long strokes  Raking - Move arms in big, long strokes  Changing light bulb - Move as much as you can with each turn  Using a screwdriver - Move as much as you can with each turn  Walking into a store/restaurant - Walk with big steps, swing arms if able, head up, step with heel first  Standing up from a chair/recliner/sofa - Scoot forward, lean  forward, and stand with big effort (reach forward/down with arms)

## 2016-11-20 NOTE — Therapy (Signed)
[facility] [place] [address] [place], [place], [address] Phone: [phone]   Fax:  [phone]  Occupational Therapy Treatment  Patient Details  Name: Stephanie Shaw MRN: [id number] Date of Birth: 1940/10/24 Referring Provider: Dr. Wells Guiles [name]   Encounter Date: 11/20/2016      OT End of Session - 11/20/16 0857    Visit Number 6   Number of Visits 17   Date for OT Re-Evaluation 12/28/16   Authorization Type [employer] Medicare, no visit limit, no auth needed.  G-code needed   Authorization - Visit Number 6   Authorization - Number of Visits 10   OT Start Time [phone]   OT Stop Time 0932   OT Time Calculation (min) 40 min   Activity Tolerance Patient tolerated treatment well   Behavior During Therapy [facility] for tasks assessed/performed      Past Medical History:  Diagnosis Date  . Colles' fracture of right radius 03/05/2015  . DEGENERATIVE JOINT DISEASE, RIGHT HIP 02/16/2007  . Dupuytren's contracture   . Osteoarthritis of hip    right  . Osteoarthritis of left knee   . POSTMENOPAUSAL SYNDROME 02/16/2007  . PREMATURE ATRIAL CONTRACTIONS 02/16/2007  . S/P breast biopsy, left    two o'clock position - benign  . 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 Venous insufficiency     Past Surgical History:  Procedure Laterality Date  . BREAST BIOPSY Left   . CATARACT EXTRACTION, BILATERAL      There were no vitals filed for this visit.      Subjective Assessment - 11/20/16 0854    Subjective  I feel that if things keep going the way they have, I won't be able to take care of myself in 6 months.  I feel more wobbly   Pertinent History possible Parkinsonism, hx of multiple falls (fx with 2 falls), degenerative joint disease R hip, a-fib, nontoxic multinodular goiter, hx of L humerus fx 2017, osteopenia, R wrist fx 2017   Limitations fall risk   Patient Stated Goals improve control and strength of LUE   Currently in Pain?  No/denies      Emphasized use of large amplitude movement strategies to prevent future complications and make ADLs more efficient/easier.   Pt educated in exercise as brain change principles.  Pt verbalized understanding.      Dressing:   Practiced donning/doffing jacket using large amplitude movement strategies after initial instruction (donning like a cape, doffing with trunk rotation).  Pt demo improvement with repetition and use/min cues for large amplitude movements.  Practiced donning/doffing sock with large amplitude movement strategies after instruction/with min cues.  Practiced pulling down clothing/adjustment with min cueing for use of PWR! Hands.     Functional mobility:   Ambulating with focus on posture and heel strike and slowing down with min-mod cueing.  Improvement with cues.  Sit>stand with min cues for large amplitude movement techniques, forward reach with improvement noted.     Practiced sweeping with use of large amplitude movement strategies.  Pt reports improvement with min cueing/after instruction and use of strategies.                        OT Education - 11/20/16 0920    Education Details Large amplitude movement strategies for ADLs   Person(s) Educated Patient   Methods Explanation;Demonstration;Handout;Verbal cues   Comprehension Verbalized understanding;Returned demonstration;Verbal cues required          OT  Short Term Goals - 11/11/16 1123      OT SHORT TERM GOAL #1   Title Pt will be independent with HEP.--check STGs 11/28/16   Time 4   Period Weeks   Status New     OT SHORT TERM GOAL #2   Title Pt will demo large amplitude movement strategies during transitional movements (bending, reaching, carrying objects, turning) with only occasional min v.c.   Time 4   Period Weeks   Status New     OT SHORT TERM GOAL #3   Title Pt will report improved ability to wash hair with LUE.   Time 4   Period Weeks   Status New      OT SHORT TERM GOAL #4   Title Pt will be able to write at least 3 sentences with only mild micrographia.   Time 4   Period Weeks   Status New     OT SHORT TERM GOAL #5   Title Pt will verbalize understanding of ways to prevent future complications and appropriate community resources.   Time 4   Period Weeks   Status New     OT SHORT TERM GOAL #6   Title Pt will improve functional reaching/coordination as shown by improving score on box and blocks test by at least 4 bilaterally.   Baseline R-36, L-31 blocks   Time 4   Period Weeks   Status New           OT Long Term Goals - 11/11/16 1124      OT LONG TERM GOAL #1   Title Pt will verbalize understanding of adative strategies/AE to incr safety/ease with ADLs/IADLs.--check LTGs 12/28/16   Time 8   Period Weeks   Status New     OT LONG TERM GOAL #2   Title Pt will improve L hand coordination for ADLs as shown by improving time on 9-hole peg test by at least 5sec.   Baseline 34.06sec   Time 8   Period Weeks   Status New     OT LONG TERM GOAL #3   Title Pt will improve balance for IADLs as shown by improving standing functional reach to at least 10" bilaterally.   Baseline R-8', L-8.5"   Time 8   Period Weeks   Status New     OT LONG TERM GOAL #4   Title Pt will improve L grip strength by least 5lbs to assist with holding objects/opening containers.   Baseline R-35lbs, L-20lbs   Time 8   Period Weeks   Status New     OT LONG TERM GOAL #5   Title Pt will improve PPT#4 by at least 5 sec for incr ease with dressing.   Baseline 24.31sec   Time 8   Period Weeks   Status New     OT LONG TERM GOAL #6   Title Pt will be able to fasten/unfasten 3 buttons in less than 37sec for incr ease with dressing.   Baseline 45.60   Time 8   Period Weeks   Status New     OT LONG TERM GOAL #7   Title Pt will improve functional reaching/coordination as shown by improving score on box and blocks test by at least 8  bilaterally.   Baseline R-36, L-31 blocks   Time 8   Period Weeks   Status New               Plan - 11/20/16 0949    Clinical  Impression Statement Pt demo improvement with cueing/use of large amplitude movements during functional tasks/ADLs.   Rehab Potential Good   OT Frequency 2x / week   OT Duration 8 weeks   OT Treatment/Interventions Self-care/ADL training;Cryotherapy;Parrafin;Therapeutic exercise;DME and/or AE instruction;Therapist, nutritional;Therapeutic activities;Patient/family education;Balance training;Manual Therapy;Neuromuscular education;Fluidtherapy;Ultrasound;Moist Heat;Energy conservation;Passive range of motion;Therapeutic exercises   Plan continue with large amplitude strategies for ADLs   OT Home Exercise Plan Education provided:  PWR! moves in supine; PWR! hands (basic 4)   Consulted and Agree with Plan of Care Patient      Patient will benefit from skilled therapeutic intervention in order to improve the following deficits and impairments:  Decreased balance, Decreased mobility, Impaired tone, Decreased range of motion, Decreased activity tolerance, Decreased knowledge of precautions, Decreased coordination, Decreased knowledge of use of DME, Decreased safety awareness, Decreased strength, Impaired UE functional use, Improper body mechanics, Improper spinal/pelvic alignment  Visit Diagnosis: Other symptoms and signs involving the nervous system  Other symptoms and signs involving the musculoskeletal system  Abnormal posture  Other abnormalities of gait and mobility  Unsteadiness on feet  Other lack of coordination  Tremor  Muscle weakness (generalized)    Problem List Patient Active Problem List   Diagnosis Date Noted  . Parkinsonism ([place]) 10/17/2016  . Fatigue 10/17/2016  . Long term (current) use of anticoagulants 05/29/2016  . Left wrist pain 01/03/2016  . Left hand pain 01/03/2016  . Vaginal atrophy 10/19/2015  . Tremor,  unspecified 10/19/2015  . Osteopenia 10/19/2015  . 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 Closed fracture of part of upper end of humerus 05/01/2015  . Hematuria 02/04/2014  . 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 Cervical pain (neck) 06/20/2010  . Atrial fibrillation ([place]) 05/21/2010  . 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 Menopausal and postmenopausal disorder 02/16/2007  . DEGENERATIVE JOINT DISEASE, RIGHT HIP 02/16/2007    [facility] 11/20/2016, 10:00 AM  [place] [address] [place], [place], [address] Phone: [phone]   Fax:  [phone]  Name: Stephanie Shaw MRN: [id number] Date of Birth: [birth year]   Vianne Bulls, OTR/L [facility] [address] [place], [place]  [address] [phone] phone [phone] 11/20/16 10:00 AM

## 2016-11-24 ENCOUNTER — Telehealth: Payer: Self-pay | Admitting: Occupational Therapy

## 2016-11-24 ENCOUNTER — Encounter: Payer: Self-pay | Admitting: Occupational Therapy

## 2016-11-24 ENCOUNTER — Ambulatory Visit: Payer: Medicare Other | Admitting: Occupational Therapy

## 2016-11-24 DIAGNOSIS — R29818 Other symptoms and signs involving the nervous system: Secondary | ICD-10-CM

## 2016-11-24 DIAGNOSIS — R251 Tremor, unspecified: Secondary | ICD-10-CM

## 2016-11-24 DIAGNOSIS — R29898 Other symptoms and signs involving the musculoskeletal system: Secondary | ICD-10-CM

## 2016-11-24 DIAGNOSIS — R293 Abnormal posture: Secondary | ICD-10-CM

## 2016-11-24 DIAGNOSIS — M6281 Muscle weakness (generalized): Secondary | ICD-10-CM

## 2016-11-24 DIAGNOSIS — R278 Other lack of coordination: Secondary | ICD-10-CM

## 2016-11-24 DIAGNOSIS — R2689 Other abnormalities of gait and mobility: Secondary | ICD-10-CM

## 2016-11-24 DIAGNOSIS — R2681 Unsteadiness on feet: Secondary | ICD-10-CM

## 2016-11-24 NOTE — Therapy (Signed)
[place] [address] [place], [place], [address] Phone: [phone]   Fax:  [phone]  Occupational Therapy Treatment  Patient Details  Name: Stephanie Shaw MRN: [id number] Date of Birth: Sep 05, 1940 Referring Provider: Dr. Wells Guiles [name]    Encounter Date: 11/24/2016  OT End of Session - 11/24/16 0852    Visit Number  7    Number of Visits  17    Date for OT Re-Evaluation  12/28/16    Authorization Type  [employer] Medicare, no visit limit, no auth needed.  G-code needed    Authorization - Visit Number  7    Authorization - Number of Visits  10    OT Start Time  0848    OT Stop Time  0930    OT Time Calculation (min)  42 min    Activity Tolerance  Patient tolerated treatment well    Behavior During Therapy  WFL for tasks assessed/performed       Past Medical History:  Diagnosis Date  . Colles' fracture of right radius 03/05/2015  . DEGENERATIVE JOINT DISEASE, RIGHT HIP 02/16/2007  . Dupuytren's contracture   . Osteoarthritis of hip    right  . Osteoarthritis of left knee   . POSTMENOPAUSAL SYNDROME 02/16/2007  . PREMATURE ATRIAL CONTRACTIONS 02/16/2007  . S/P breast biopsy, left    two o'clock position - benign  . 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 Venous insufficiency     Past Surgical History:  Procedure Laterality Date  . BREAST BIOPSY Left   . CATARACT EXTRACTION, BILATERAL      There were no vitals filed for this visit.  Subjective Assessment - 11/24/16 0850    Subjective   Pt reports that she fell yesterday when she bent over to pick up a can (in grass) and fell.       Dr. Carles Collet appt tomorrow    Pertinent History  possible Parkinsonism, hx of multiple falls (fx with 2 falls), degenerative joint disease R hip, a-fib, nontoxic multinodular goiter, hx of L humerus fx 2017, osteopenia, R wrist fx 2017    Limitations  fall risk    Patient Stated Goals  improve control and strength of LUE    Currently  in Pain?  No/denies         Dressing:   Simulated ADLs with bag with focus/min-mod cues for large amplitude movements:  donning/doffing pants.      Functional mobility:   Practiced retrieving items from the floor with using of large amplitude movement strategies with min-mod cues and with functional reaching to place on.  Ambulating with focus on big amplitude (posture, head/eyes up, extended knees, heel strike) with min-mod cues.  Progressed to carrying objects in 1 hand while using large amplitude movements.  Ambulating with working on stops with feet apart (one in front of the other) and retrieving items from the floor.  Pt instructed in strategies to prevent freezing and how to initiate ambulation after stopping to pick up objects.  Min-mod cues.  Sit>stand with min cues for large amplitude movement technique until pt could perform consistently.  Educated pt to stand prior to picking up objects instead of trying to stand with objects in hands.  Pt returned demo.    Side-stepping in prep to sit in chair at table and then sitting at table with min cueing.                    OT Short  Term Goals - 11/11/16 1123      OT SHORT TERM GOAL #1   Title  Pt will be independent with HEP.--check STGs 11/28/16    Time  4    Period  Weeks    Status  New      OT SHORT TERM GOAL #2   Title  Pt will demo large amplitude movement strategies during transitional movements (bending, reaching, carrying objects, turning) with only occasional min v.c.    Time  4    Period  Weeks    Status  New      OT SHORT TERM GOAL #3   Title  Pt will report improved ability to wash hair with LUE.    Time  4    Period  Weeks    Status  New      OT SHORT TERM GOAL #4   Title  Pt will be able to write at least 3 sentences with only mild micrographia.    Time  4    Period  Weeks    Status  New      OT SHORT TERM GOAL #5   Title  Pt will verbalize understanding of ways to prevent future  complications and appropriate community resources.    Time  4    Period  Weeks    Status  New      OT SHORT TERM GOAL #6   Title  Pt will improve functional reaching/coordination as shown by improving score on box and blocks test by at least 4 bilaterally.    Baseline  R-36, L-31 blocks    Time  4    Period  Weeks    Status  New        OT Long Term Goals - 11/11/16 1124      OT LONG TERM GOAL #1   Title  Pt will verbalize understanding of adative strategies/AE to incr safety/ease with ADLs/IADLs.--check LTGs 12/28/16    Time  8    Period  Weeks    Status  New      OT LONG TERM GOAL #2   Title  Pt will improve L hand coordination for ADLs as shown by improving time on 9-hole peg test by at least 5sec.    Baseline  34.06sec    Time  8    Period  Weeks    Status  New      OT LONG TERM GOAL #3   Title  Pt will improve balance for IADLs as shown by improving standing functional reach to at least 10" bilaterally.    Baseline  R-8', L-8.5"    Time  8    Period  Weeks    Status  New      OT LONG TERM GOAL #4   Title  Pt will improve L grip strength by least 5lbs to assist with holding objects/opening containers.    Baseline  R-35lbs, L-20lbs    Time  8    Period  Weeks    Status  New      OT LONG TERM GOAL #5   Title  Pt will improve PPT#4 by at least 5 sec for incr ease with dressing.    Baseline  24.31sec    Time  8    Period  Weeks    Status  New      OT LONG TERM GOAL #6   Title  Pt will be able to fasten/unfasten 3 buttons in less than 37sec  for incr ease with dressing.    Baseline  45.60    Time  8    Period  Weeks    Status  New      OT LONG TERM GOAL #7   Title  Pt will improve functional reaching/coordination as shown by improving score on box and blocks test by at least 8 bilaterally.    Baseline  R-36, L-31 blocks    Time  8    Period  Weeks    Status  New            Plan - 11/24/16 8127    Clinical Impression Statement  Pt demo improvement  of functional tasks/ADLs with use and cueing for large amplitude movement strategies.  However, pt needs min-mod cueing to perform strategies.    Rehab Potential  Good    OT Frequency  2x / week    OT Duration  8 weeks    OT Treatment/Interventions  Self-care/ADL training;Cryotherapy;Parrafin;Therapeutic exercise;DME and/or AE instruction;Therapist, nutritional;Therapeutic activities;Patient/family education;Balance training;Manual Therapy;Neuromuscular education;Fluidtherapy;Ultrasound;Moist Heat;Energy conservation;Passive range of motion;Therapeutic exercises    Plan  continue with large amplitude movement strategies for ADLs; floor transfer; begin checking STGs    OT Home Exercise Plan  Education provided:  PWR! moves in supine; PWR! hands (basic 4)    Consulted and Agree with Plan of Care  Patient       Patient will benefit from skilled therapeutic intervention in order to improve the following deficits and impairments:  Decreased balance, Decreased mobility, Impaired tone, Decreased range of motion, Decreased activity tolerance, Decreased knowledge of precautions, Decreased coordination, Decreased knowledge of use of DME, Decreased safety awareness, Decreased strength, Impaired UE functional use, Improper body mechanics, Improper spinal/pelvic alignment  Visit Diagnosis: Other symptoms and signs involving the musculoskeletal system  Other symptoms and signs involving the nervous system  Other abnormalities of gait and mobility  Unsteadiness on feet  Abnormal posture  Other lack of coordination  Tremor  Muscle weakness (generalized)    Problem List Patient Active Problem List   Diagnosis Date Noted  . Parkinsonism ([place]) 10/17/2016  . Fatigue 10/17/2016  . Long term (current) use of anticoagulants 05/29/2016  . Left wrist pain 01/03/2016  . Left hand pain 01/03/2016  . Vaginal atrophy 10/19/2015  . Tremor, unspecified 10/19/2015  . Osteopenia 10/19/2015  .  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 Closed fracture of part of upper end of humerus 05/01/2015  . Hematuria 02/04/2014  . 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 Cervical pain (neck) 06/20/2010  . Atrial fibrillation ([place]) 05/21/2010  . 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 Menopausal and postmenopausal disorder 02/16/2007  . DEGENERATIVE JOINT DISEASE, RIGHT HIP 02/16/2007    [facility] 11/24/2016, 1:07 PM  [place] [address] [place], [place], [address] Phone: [phone]   Fax:  [phone]  Name: Stephanie Shaw MRN: [id number] Date of Birth: 06-08-1940   Vianne Bulls, OTR/L [facility] [address] [place], [place]  [address] [phone] phone [phone] 11/24/16 1:07 PM

## 2016-11-24 NOTE — Telephone Encounter (Signed)
Returned daughter's phone call.  Daughter wanted to know how her mother is doing in therapy.    Discussed progress in therapy and what occupational therapy is working on (bigger movements during ADLs, reinforcing mobility strategies for improved balance).  Emphasized posture, keeping feet apart, moving bigger, opening hand for grasp.  Discussed that pt is doing better with use of strategies and cues, but it is difficult to determine carryover as pt lives alone.  Also discussed that PWR! Moves class is a good reinforcement after d/c.  Recommended daughter encourage pt to continue with HEP, community exercise, and big movement patterns.  Discussed follow-up after therapy d/c and that if she/pt have concerns about decline before therapy scheduled follow-up, they should discuss with Dr. Carles Collet.  Also, recommended pt/daughter discuss long-term expectations, medication, and diagnosis with Dr. Carles Collet.  Daughter verbalized understanding.   [place] attend appointment with Dr. Carles Collet on Wednesday.      Vianne Bulls, OTR/L [facility] [address] [place], [place]  [address] [phone] phone [phone] 11/24/16 3:26 PM

## 2016-11-25 NOTE — Progress Notes (Signed)
Stephanie Shaw was seen today in the movement disorders clinic for neurologic consultation at the request of [name], [name], MD.  The consultation is for the evaluation of tremor.  The records that were made available to me were reviewed.   Tremor: Yes.     How long has it been going on? 1-2 years  At rest or with activation?  With activation  Fam hx of tremor?  Yes.    Located where?  "all over" - jaw, voice, left leg, both arms  Affected by caffeine:  No. (1 cup coffee per day)  Affected by alcohol:  Doesn't drink enough to know  Affected by stress:  unsure  Affected by fatigue: unsure  Spills soup if on spoon:  Will be okay if holds spoon tight  Spills glass of liquid if full:  No.  Affects ADL's (tying shoes, brushing teeth, etc):  Yes.  , but just flossing teeth  Other Specific Symptoms:  Voice: noted vocal tremor Sleep: "I'm sleeping all day and all night"  Vivid Dreams:  No.  Acting out dreams:  No. Wet Pillows: occasionally Postural symptoms:  Yes.  , some  Falls?  Yes.  , 2 in the last year but "little falls" - the prior year had fx both arms Bradykinesia symptoms: difficulty getting out of a chair and difficulty regaining balance Loss of smell:  No. Loss of taste:  No. Urinary Incontinence:  No. Difficulty Swallowing:  No. (rarely has trouble) Handwriting, micrographia: Yes.   Trouble buttoning clothing: Yes.   Depression:  Yes.  , just mild because just retired and was troublesome for her Memory changes:  No. Hallucinations:  No.  visual distortions: No. N/V:  No. Lightheaded:  No.  Syncope: No. Diplopia:  No. Dyskinesia:  No.  Neuroimaging has previously been performed.  Patient had a CT of the brain in April, 2017 after a fall.  I reviewed this.  It was unremarkable.  11/26/16 update: Patient is seen in today for follow-up of testing results.  She is accompanied by her daughter who supplements the history.  She had a DaTscan in September, 2018.  I have  reviewed those results.  DaT scan officially showed Poor uptake of dopamine transport specific radiotracer within the LEFT and RIGHT putamen and decrease relative uptake in the head of the RIGHT caudate nucleus.  When I looked at the scan myself, there was mostly decreased uptake on the right.  MRI of the brain was done September 05, 2016.  I personally reviewed this.  It was normal.  She has been attending therapies at the rehab center.  She has started [employer].  The records that were made available to me were reviewed.  She did have a fall since last visit.  She fell in the grass.  Someone helped her get up and she didn't get hurt.    PREVIOUS MEDICATIONS: none to date  ALLERGIES:   Allergies  Allergen Reactions  . Doxycycline Nausea And Vomiting  . Sulfonamide Derivatives     REACTION: HIVES    CURRENT MEDICATIONS:  Outpatient Encounter Medications as of 11/26/2016  Medication Sig  . cholecalciferol (VITAMIN D) 400 units TABS tablet Take 400 Units by mouth daily.  Marland Kitchen diltiazem (CARDIZEM CD) 180 MG 24 hr capsule TAKE ONE CAPSULE EVERY DAY  . estradiol (ESTRACE VAGINAL) 0.1 MG/GM vaginal cream INSERT 1/4 OF APPLICATORFUL VAGINALLY TWICE PER WEEK  . ferrous sulfate 325 (65 FE) MG EC tablet Take 325  mg by mouth 2 (two) times a week.   . Flaxseed, Linseed, (FLAX SEED OIL) 1000 MG CAPS Take 2 capsules by mouth 2 (two) times daily.   Alveda Reasons 20 MG TABS tablet TAKE 1 TABLET BY MOUTH ONCE DAILY   No facility-administered encounter medications on file as of 11/26/2016.     PAST MEDICAL HISTORY:   Past Medical History:  Diagnosis Date  . Colles' fracture of right radius 03/05/2015  . DEGENERATIVE JOINT DISEASE, RIGHT HIP 02/16/2007  . Dupuytren's contracture   . Osteoarthritis of hip    right  . Osteoarthritis of left knee   . POSTMENOPAUSAL SYNDROME 02/16/2007  . PREMATURE ATRIAL CONTRACTIONS 02/16/2007  . S/P breast biopsy, left    two o'clock position - benign  . 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 Venous insufficiency     PAST SURGICAL HISTORY:   Past Surgical History:  Procedure Laterality Date  . BREAST BIOPSY Left   . CATARACT EXTRACTION, BILATERAL      SOCIAL HISTORY:   Social History   Socioeconomic History  . Marital status: Divorced    Spouse name: Not on file  . Number of children: Not on file  . Years of education: Not on file  . Highest education level: Not on file  Social Needs  . Financial resource strain: Not on file  . Food insecurity - worry: Not on file  . Food insecurity - inability: Not on file  . Transportation needs - medical: Not on file  . Transportation needs - non-medical: Not on file  Occupational History  . Occupation: retired    Comment: math, [employer]  Tobacco Use  . Smoking status: Former Smoker    Last attempt to quit: 08/27/1971    Years since quitting: 45.2  . Smokeless tobacco: Never Used  Substance and Sexual Activity  . Alcohol use: No    Alcohol/week: 0.0 oz    Comment: hardly ever  . Drug use: No  . Sexual activity: Not on file  Other Topics Concern  . Not on file  Social History Narrative  . Not on file    FAMILY HISTORY:   Family Status  Relation Name Status  . Father  Deceased  . Mother  Deceased  . Sister  Alive  . Sister  Alive  . Child 2 Alive    ROS:   A complete 10 system review of systems was obtained and was unremarkable apart from what is mentioned above.  PHYSICAL EXAMINATION:    VITALS:   Vitals:   11/26/16 1046  BP: 108/62  Pulse: 90  SpO2: 97%  Weight: 135 lb (61.2 kg)  Height: '5\' 8"'  (1.727 m)    GEN:  The patient appears stated age and is in NAD. HEENT:  Normocephalic, atraumatic.  The mucous membranes are moist. The superficial temporal arteries are without ropiness or tenderness. CV:  Tachycardic.  Regular.   Lungs:  CTAB Neck/HEME:  There are no carotid bruits bilaterally.  Neurological examination:  Orientation: The patient is alert and oriented x3.    Cranial nerves: There is good facial symmetry. There is facial hypomimia  Pupils are equal round and reactive to light bilaterally. Fundoscopic exam reveals clear margins bilaterally. Extraocular muscles are intact. The visual fields are full to confrontational testing. The speech is fluent and clear. She is hypophonic.  Soft palate rises symmetrically and there is no tongue deviation. Hearing is intact to conversational tone. Sensation: Sensation is intact to light touch throughout.  Motor: Strength is at least antigravity x 4  Movement examination: Tone: There is mild increased tone with activation only on the RUE Abnormal movements: There is no tremor even with distraction procedures.  No tremor of outstretched hands or with intention.   Coordination:  There is decremation with RAM's, with any form of RAMS, including alternating supination and pronation of the forearm, hand opening and closing, finger taps, heel taps and toe taps on the L.  RAMs on the right are good Gait and Station: The patient has some difficulty arising out of a deep-seated chair without the use of the hands but she is able to do it on the first attempt.  She walks on the tip toes today.     Labs:  Lab Results  Component Value Date   VITAMINB12 1,238 (H) 08/19/2016     Chemistry      Component Value Date/Time   NA 138 08/19/2016 1029   NA 139 05/29/2016 1043   K 4.3 08/19/2016 1029   CL 101 08/19/2016 1029   CO2 30 08/19/2016 1029   BUN 14 08/19/2016 1029   BUN 22 05/29/2016 1043   CREATININE 0.79 08/19/2016 1029   CREATININE 0.92 06/14/2015 0853      Component Value Date/Time   CALCIUM 9.6 08/19/2016 1029   ALKPHOS 46 08/19/2016 1029   AST 21 08/19/2016 1029   ALT 15 08/19/2016 1029   BILITOT 0.6 08/19/2016 1029     Lab Results  Component Value Date   TSH 1.00 08/19/2016      ASSESSMENT/PLAN:  1.  Early idiopathic Parkinson's disease, Hoehn and Yoehr stage I  -DaTscan was positive in  September, 2018  -Her daughter had many questions and I answered them to the best of my ability today.  Her daughter asked about several medications, but many were not appropriate for this age group and would likely increase risk for confusion and hallucinations.  -We discussed the diagnosis as well as pathophysiology of the disease.  We discussed treatment options as well as prognostic indicators.  Patient education was provided.  -We discussed that it used to be thought that levodopa would increase risk of melanoma but now it is believed that Parkinsons itself likely increases risk of melanoma. she is to get regular skin checks.  -We talked about medication options as well as potential future surgical options.  We talked about safety in the home.  -We decided to add carbidopa/levodopa 25/100.  1/2 tab tid x 1 wk, then 1/2 in am & noon & 1 at night for a week, then 1/2 in am &1 at noon &night for a week, then 1 po tid.  Risks, benefits, side effects and alternative therapies were discussed, which included but were not limited to dyskinesia.  The opportunity to ask questions was given and they were answered to the best of my ability.  The patient expressed understanding and willingness to follow the outlined treatment protocols.  -We discussed community resources in the area including patient support groups and community exercise programs for PD and pt education was provided to the patient.  She met with our [place] social worker today.  2.  Mild anxiety and depression  -Some of her depression surrounds the fact that she just stopped working and that was hard for her.  Her daughter wanted me to start her on some medication, but I told her that I thought that it would be best to start the levodopa and get her involved perhaps with cognitive  behavioral therapy first.  I did not think it would be best to start on 2 different medicines at once.  3.  Much greater than 50% of this 40-minute visit was spent in  counseling with the patient and her daughter.  This did not include the time that she spent with our Education officer, museum.  I will plan on seeing her back in the next 3 months, sooner should new neurologic issues arise.   Cc:  [name], [name], MD

## 2016-11-26 ENCOUNTER — Ambulatory Visit: Payer: Medicare Other | Admitting: Neurology

## 2016-11-26 ENCOUNTER — Encounter: Payer: Self-pay | Admitting: Psychology

## 2016-11-26 ENCOUNTER — Encounter: Payer: Self-pay | Admitting: Neurology

## 2016-11-26 VITALS — BP 108/62 | HR 90 | Ht 68.0 in | Wt 135.0 lb

## 2016-11-26 DIAGNOSIS — F330 Major depressive disorder, recurrent, mild: Secondary | ICD-10-CM

## 2016-11-26 DIAGNOSIS — G20 Parkinson's disease: Secondary | ICD-10-CM

## 2016-11-26 MED ORDER — CARBIDOPA-LEVODOPA 25-100 MG PO TABS: 1.0000 | ORAL_TABLET | Freq: Three times a day (TID) | ORAL | 3 refills | Status: DC

## 2016-11-26 NOTE — Progress Notes (Signed)
I met with the Stephanie Shaw and her daughter today while they were in the clinic today. I reviewed the Parkinson's community information. In addition, we talked a little bit about CBT and about therapy options shall be interested. I provided her with resources and information on [place] and  Behavorial Health. The patient also talked a little bit about moving from a condo to independent living. There seems to be some anxiety around what to expect. We talked a little about balancing just adjusting to the diagnosis, building a good wellness plan and enjoying things that are important to her.   

## 2016-11-26 NOTE — Patient Instructions (Signed)
1. Start Carbidopa Levodopa as follows:  Take 1/2 tablet three times daily, at least 30 minutes before meals, for one week  Then take 1/2 tablet in the morning, 1/2 tablet in the afternoon, 1 tablet in the evening, at least 30 minutes before meals, for one week  Then take 1/2 tablet in the morning, 1 tablet in the afternoon, 1 tablet in the evening, at least 30 minutes before meals, for one week  Then take 1 tablet three times daily, at least 30 minutes before meals  

## 2016-11-27 ENCOUNTER — Encounter: Payer: Self-pay | Admitting: Occupational Therapy

## 2016-11-27 ENCOUNTER — Ambulatory Visit: Payer: Medicare Other | Admitting: Occupational Therapy

## 2016-11-27 ENCOUNTER — Ambulatory Visit: Payer: BC Managed Care – PPO | Admitting: Neurology

## 2016-11-27 DIAGNOSIS — R29818 Other symptoms and signs involving the nervous system: Secondary | ICD-10-CM

## 2016-11-27 DIAGNOSIS — M6281 Muscle weakness (generalized): Secondary | ICD-10-CM

## 2016-11-27 DIAGNOSIS — R278 Other lack of coordination: Secondary | ICD-10-CM

## 2016-11-27 DIAGNOSIS — R2689 Other abnormalities of gait and mobility: Secondary | ICD-10-CM

## 2016-11-27 DIAGNOSIS — R29898 Other symptoms and signs involving the musculoskeletal system: Secondary | ICD-10-CM

## 2016-11-27 DIAGNOSIS — R2681 Unsteadiness on feet: Secondary | ICD-10-CM

## 2016-11-27 DIAGNOSIS — R293 Abnormal posture: Secondary | ICD-10-CM

## 2016-11-27 NOTE — Therapy (Signed)
[facility] [address] [place], [place], [address] Phone: [phone]   Fax:  [phone]  Occupational Therapy Treatment  Patient Details  Name: Stephanie Shaw MRN: [id number] Date of Birth: 1940/05/03 Referring Provider: Dr. Wells Guiles [name]    Encounter Date: 11/27/2016  OT End of Session - 11/27/16 0903    Visit Number  8    Number of Visits  17    Date for OT Re-Evaluation  12/28/16    Authorization Type  [employer] Medicare, no visit limit, no auth needed.  G-code needed    Authorization - Visit Number  8    Authorization - Number of Visits  10    OT Start Time  [phone]    OT Stop Time  0930    OT Time Calculation (min)  40 min    Activity Tolerance  Patient tolerated treatment well    Behavior During Therapy  WFL for tasks assessed/performed       Past Medical History:  Diagnosis Date  . Colles' fracture of right radius 03/05/2015  . DEGENERATIVE JOINT DISEASE, RIGHT HIP 02/16/2007  . Dupuytren's contracture   . Osteoarthritis of hip    right  . Osteoarthritis of left knee   . POSTMENOPAUSAL SYNDROME 02/16/2007  . PREMATURE ATRIAL CONTRACTIONS 02/16/2007  . S/P breast biopsy, left    two o'clock position - benign  . 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 Venous insufficiency     Past Surgical History:  Procedure Laterality Date  . BREAST BIOPSY Left   . CATARACT EXTRACTION, BILATERAL      There were no vitals filed for this visit.  Subjective Assessment - 11/27/16 0854    Subjective   Started medication last night    Pertinent History  possible Parkinsonism, hx of multiple falls (fx with 2 falls), degenerative joint disease R hip, a-fib, nontoxic multinodular goiter, hx of L humerus fx 2017, osteopenia, R wrist fx 2017    Limitations  fall risk    Patient Stated Goals  improve control and strength of LUE    Currently in Pain?  No/denies       Dressing:   Donning jacket using large amplitude movement  strategies Pt demo with good success.  Simulated ADLs with bag with focus/min-mod cues for large amplitude movements:  Donning/doffing pull-over shirt, donning/doffing pants, pulling shirt down in back/drying back.    Functional mobility:   Sit>stand with min cues for large amplitude movement technique.  Practiced Floor transfer, pt performed independently on mat (on floor) without UE support but uses a technique with trunk rotation.  Tried PWR! Step strategy, but pt unable to perform this way without UE support.  Pt instructed to continue with her strategy as it still incorporates large movements.    Writing:  Practiced copying 4 sentences in cursive/print with focus/min cues on letter size.  Pt with only min decr in size occasionally with  100% legibility.    Min cueing for posture and use of PWR! hands   Began checking STGs and discussing progress--see below.    Discussed PD diagnosis and medication as pt had MD appt yesterday.  Pt reports interest in cycling class after therapy d/c.  Encouraged pt to participate in class.  Also discussed that medication and exercises helps to best manage PD.    Reviewed importance of use of large amplitude movement strategies for ADLs to incr carryover and incr ease.  Pt verbalized understanding.  OT Short Term Goals - 11/27/16 0920      OT SHORT TERM GOAL #1   Title  Pt will be independent with HEP.--check STGs 11/28/16    Time  4    Period  Weeks    Status  On-going 11/27/16:  met for current, but would benefit from additional updates   11/27/16:  met for current, but would benefit from additional updates     OT [place] #2   Title  Pt will demo large amplitude movement strategies during transitional movements (bending, reaching, carrying objects, turning) with only occasional min v.c.    Time  4    Period  Weeks    Status  On-going 11/27/16:  consistent min to occasional mod v.c.   11/27/16:  consistent min to  occasional mod v.c.     OT SHORT TERM GOAL #3   Title  Pt will report improved ability to wash hair with LUE.    Time  4    Period  Weeks    Status  Achieved 12/07/16  met   12/07/16  met     OT SHORT TERM GOAL #4   Title  Pt will be able to write at least 3 sentences with only mild micrographia.    Time  4    Period  Weeks    Status  Achieved 11/27/16   11/27/16     OT SHORT TERM GOAL #5   Title  Pt will verbalize understanding of ways to prevent future complications and appropriate community resources.    Time  4    Period  Weeks    Status  Achieved 11/27/16 met   11/27/16 met     OT SHORT TERM GOAL #6   Title  Pt will improve functional reaching/coordination as shown by improving score on box and blocks test by at least 4 bilaterally.    Baseline  R-36, L-31 blocks    Time  4    Period  Weeks    Status  New        OT Long Term Goals - 11/11/16 1124      OT LONG TERM GOAL #1   Title  Pt will verbalize understanding of adative strategies/AE to incr safety/ease with ADLs/IADLs.--check LTGs 12/28/16    Time  8    Period  Weeks    Status  New      OT LONG TERM GOAL #2   Title  Pt will improve L hand coordination for ADLs as shown by improving time on 9-hole peg test by at least 5sec.    Baseline  34.06sec    Time  8    Period  Weeks    Status  New      OT LONG TERM GOAL #3   Title  Pt will improve balance for IADLs as shown by improving standing functional reach to at least 10" bilaterally.    Baseline  R-8', L-8.5"    Time  8    Period  Weeks    Status  New      OT LONG TERM GOAL #4   Title  Pt will improve L grip strength by least 5lbs to assist with holding objects/opening containers.    Baseline  R-35lbs, L-20lbs    Time  8    Period  Weeks    Status  New      OT LONG TERM GOAL #5   Title  Pt will improve PPT#4 by at least  5 sec for incr ease with dressing.    Baseline  24.31sec    Time  8    Period  Weeks    Status  New      OT LONG TERM GOAL #6    Title  Pt will be able to fasten/unfasten 3 buttons in less than 37sec for incr ease with dressing.    Baseline  45.60    Time  8    Period  Weeks    Status  New      OT LONG TERM GOAL #7   Title  Pt will improve functional reaching/coordination as shown by improving score on box and blocks test by at least 8 bilaterally.    Baseline  R-36, L-31 blocks    Time  8    Period  Weeks    Status  New            Plan - 11/27/16 3007    Clinical Impression Statement  Pt is progressing towards goals with improved movement amplitude and improved awareness of movement.  However, pt continues to need min-mod cueing at times.    Rehab Potential  Good    OT Frequency  2x / week    OT Duration  8 weeks    OT Treatment/Interventions  Self-care/ADL training;Cryotherapy;Parrafin;Therapeutic exercise;DME and/or AE instruction;Therapist, nutritional;Therapeutic activities;Patient/family education;Balance training;Manual Therapy;Neuromuscular education;Fluidtherapy;Ultrasound;Moist Heat;Energy conservation;Passive range of motion;Therapeutic exercises    Plan  check continue with large amplitude movements/strategies for ADLs; functional reaching in standing.    OT Home Exercise Plan  Education provided:  PWR! moves in supine; PWR! hands (basic 4)    Consulted and Agree with Plan of Care  Patient       Patient will benefit from skilled therapeutic intervention in order to improve the following deficits and impairments:  Decreased balance, Decreased mobility, Impaired tone, Decreased range of motion, Decreased activity tolerance, Decreased knowledge of precautions, Decreased coordination, Decreased knowledge of use of DME, Decreased safety awareness, Decreased strength, Impaired UE functional use, Improper body mechanics, Improper spinal/pelvic alignment  Visit Diagnosis: Other symptoms and signs involving the musculoskeletal system  Other symptoms and signs involving the nervous  system  Unsteadiness on feet  Other abnormalities of gait and mobility  Abnormal posture  Other lack of coordination  Muscle weakness (generalized)    Problem List Patient Active Problem List   Diagnosis Date Noted  . Parkinson's disease ([place]) 11/26/2016  . Parkinsonism ([place]) 10/17/2016  . Fatigue 10/17/2016  . Long term (current) use of anticoagulants 05/29/2016  . Left wrist pain 01/03/2016  . Left hand pain 01/03/2016  . Vaginal atrophy 10/19/2015  . Tremor, unspecified 10/19/2015  . Osteopenia 10/19/2015  . 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 Closed fracture of part of upper end of humerus 05/01/2015  . Hematuria 02/04/2014  . 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 Cervical pain (neck) 06/20/2010  . Atrial fibrillation ([place]) 05/21/2010  . 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 Menopausal and postmenopausal disorder 02/16/2007  . DEGENERATIVE JOINT DISEASE, RIGHT HIP 02/16/2007    [facility] 11/27/2016, 9:59 AM  [facility] [address] [place], [place], [address] Phone: [phone]   Fax:  [phone]  Name: DANAYAH SMYRE MRN: [id number] Date of Birth: [birth year]   Vianne Bulls, OTR/L [facility] [address] [place], [place]  [address] [phone] phone [phone] 11/27/16 9:59 AM

## 2016-12-02 ENCOUNTER — Ambulatory Visit
Admission: RE | Admit: 2016-12-02 | Discharge: 2016-12-02 | Disposition: A | Discharge status: Home / Self Care | Payer: Medicare Other | Source: Ambulatory Visit | Attending: Family Medicine | Admitting: Family Medicine

## 2016-12-02 ENCOUNTER — Encounter: Payer: Self-pay | Admitting: Neurology

## 2016-12-02 DIAGNOSIS — Z139 Encounter for screening, unspecified: Secondary | ICD-10-CM

## 2016-12-03 ENCOUNTER — Ambulatory Visit: Payer: Medicare Other | Admitting: Occupational Therapy

## 2016-12-03 DIAGNOSIS — R293 Abnormal posture: Secondary | ICD-10-CM

## 2016-12-03 DIAGNOSIS — R29818 Other symptoms and signs involving the nervous system: Secondary | ICD-10-CM

## 2016-12-03 DIAGNOSIS — R2681 Unsteadiness on feet: Secondary | ICD-10-CM

## 2016-12-03 DIAGNOSIS — R29898 Other symptoms and signs involving the musculoskeletal system: Secondary | ICD-10-CM

## 2016-12-03 DIAGNOSIS — R278 Other lack of coordination: Secondary | ICD-10-CM

## 2016-12-03 NOTE — Patient Instructions (Signed)
Bag Exercises:  Small trash bag or produce bag works best.  For all exercises, sit with big posture (sit up tall with head up) and use big movements. Perform the following exercises 1 times per day.   Hold bag in one hand. Stretch both arms/hands out to the side as big as you can. Then, pass bag from one hand to the other BEHIND you. Stretch arms back out big after each pass. Repeat 10 times.  Hold bag in both hands in front of you with hands/arms shoulder length apart. Move bag behind your head. Repeat 10 times.  Hold bag in both hands in front of you with hands/arms shoulder length apart. Lift leg and move bag completely under each foot and back. Repeat 10 times on each side.  Hold bag in right hand. Move right hand to reach behind shoulder. Then, reach behind back with left hand to pass bag from right hand to left hand. Switch sides. Repeat 10 times on each side.

## 2016-12-03 NOTE — Therapy (Signed)
[facility] [address] [place], [place], [address] Phone: [phone]   Fax:  [phone]  Occupational Therapy Treatment  Patient Details  Name: Stephanie Shaw MRN: [id number] Date of Birth: [birth year] Referring Provider: Dr. Wells Guiles [name]    Encounter Date: 12/03/2016  OT End of Session - 12/03/16 1018    Visit Number  9    Number of Visits  17    Date for OT Re-Evaluation  12/28/16    Authorization Type  [employer] Medicare, no visit limit, no auth needed.  G-code needed    Authorization - Visit Number  9    Authorization - Number of Visits  10    OT Start Time  0930    OT Stop Time  1015    OT Time Calculation (min)  45 min    Activity Tolerance  Patient tolerated treatment well    Behavior During Therapy  WFL for tasks assessed/performed       Past Medical History:  Diagnosis Date  . Colles' fracture of right radius 03/05/2015  . DEGENERATIVE JOINT DISEASE, RIGHT HIP 02/16/2007  . Dupuytren's contracture   . Osteoarthritis of hip    right  . Osteoarthritis of left knee   . POSTMENOPAUSAL SYNDROME 02/16/2007  . PREMATURE ATRIAL CONTRACTIONS 02/16/2007  . S/P breast biopsy, left    two o'clock position - benign  . 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 Venous insufficiency     Past Surgical History:  Procedure Laterality Date  . BREAST BIOPSY Left   . CATARACT EXTRACTION, BILATERAL      There were no vitals filed for this visit.  Subjective Assessment - 12/03/16 0930    Pertinent History  possible Parkinsonism, hx of multiple falls (fx with 2 falls), degenerative joint disease R hip, a-fib, nontoxic multinodular goiter, hx of L humerus fx 2017, osteopenia, R wrist fx 2017    Limitations  fall risk    Patient Stated Goals  improve control and strength of LUE    Currently in Pain?  No/denies                   OT Treatments/Exercises (OP) - 12/03/16 0001      ADLs   UB Dressing  Practiced  donning/doffing jacket again with large amplitude movements    ADL Comments  Reviewed benefits from medication and exercise combined and reviewed when to snack b/t medications. Reviewed and issued bag ex's to simulate dressing (overhead, tucking shirt down/washing back, reaching behind to doff jacket, and LE dressing) - issued as HEP to do 1x/day in the am preferably prior to dressing/showering      Exercises   Exercises  -- UBE x 5 min. trying to keep RPM > 30      Neurological Re-education Exercises   Other Exercises 1  Standing with functional reaching and PWR! step patterns to flip cards over and place behind patient, and PWR! Rock/step through patterns to reach for cones behind and then place on high shelf. Each activity bilaterally and mod cueing t/o task to maintain large amplitude movements.              OT Education - 12/03/16 [phone]    Education provided  Yes    Education Details  Bag exercises for dressing    Person(s) Educated  Patient    Methods  Explanation;Demonstration;Handout    Comprehension  Verbalized understanding;Returned demonstration;Verbal cues required       OT Short  Term Goals - 11/27/16 0920      OT SHORT TERM GOAL #1   Title  Pt will be independent with HEP.--check STGs 11/28/16    Time  4    Period  Weeks    Status  On-going 11/27/16:  met for current, but would benefit from additional updates      OT [place] #2   Title  Pt will demo large amplitude movement strategies during transitional movements (bending, reaching, carrying objects, turning) with only occasional min v.c.    Time  4    Period  Weeks    Status  On-going 11/27/16:  consistent min to occasional mod v.c.      OT SHORT TERM GOAL #3   Title  Pt will report improved ability to wash hair with LUE.    Time  4    Period  Weeks    Status  Achieved 12/07/16  met      OT SHORT TERM GOAL #4   Title  Pt will be able to write at least 3 sentences with only mild micrographia.    Time   4    Period  Weeks    Status  Achieved 11/27/16      OT SHORT TERM GOAL #5   Title  Pt will verbalize understanding of ways to prevent future complications and appropriate community resources.    Time  4    Period  Weeks    Status  Achieved 11/27/16 met      OT SHORT TERM GOAL #6   Title  Pt will improve functional reaching/coordination as shown by improving score on box and blocks test by at least 4 bilaterally.    Baseline  R-36, L-31 blocks    Time  4    Period  Weeks    Status  New        OT Long Term Goals - 11/11/16 1124      OT LONG TERM GOAL #1   Title  Pt will verbalize understanding of adative strategies/AE to incr safety/ease with ADLs/IADLs.--check LTGs 12/28/16    Time  8    Period  Weeks    Status  New      OT LONG TERM GOAL #2   Title  Pt will improve L hand coordination for ADLs as shown by improving time on 9-hole peg test by at least 5sec.    Baseline  34.06sec    Time  8    Period  Weeks    Status  New      OT LONG TERM GOAL #3   Title  Pt will improve balance for IADLs as shown by improving standing functional reach to at least 10" bilaterally.    Baseline  R-8', L-8.5"    Time  8    Period  Weeks    Status  New      OT LONG TERM GOAL #4   Title  Pt will improve L grip strength by least 5lbs to assist with holding objects/opening containers.    Baseline  R-35lbs, L-20lbs    Time  8    Period  Weeks    Status  New      OT LONG TERM GOAL #5   Title  Pt will improve PPT#4 by at least 5 sec for incr ease with dressing.    Baseline  24.31sec    Time  8    Period  Weeks    Status  New  OT LONG TERM GOAL #6   Title  Pt will be able to fasten/unfasten 3 buttons in less than 37sec for incr ease with dressing.    Baseline  45.60    Time  8    Period  Weeks    Status  New      OT LONG TERM GOAL #7   Title  Pt will improve functional reaching/coordination as shown by improving score on box and blocks test by at least 8 bilaterally.    Baseline   R-36, L-31 blocks    Time  8    Period  Weeks    Status  New            Plan - 12/03/16 1019    Clinical Impression Statement  Pt continues to make progress towards goals, however needs reinforcement and cueing to maintain large amplitude movements    Rehab Potential  Good    OT Frequency  2x / week    OT Duration  8 weeks    OT Treatment/Interventions  Self-care/ADL training;Cryotherapy;Parrafin;Therapeutic exercise;DME and/or AE instruction;Therapist, nutritional;Therapeutic activities;Patient/family education;Balance training;Manual Therapy;Neuromuscular education;Fluidtherapy;Ultrasound;Moist Heat;Energy conservation;Passive range of motion;Therapeutic exercises    Plan  G-code, assess STG #6, continue functional reaching in standing, reinforcing strategies for ADLS    OT Home Exercise Plan  Education provided:  PWR! moves in supine; PWR! hands (basic 4), bag exercises    Consulted and Agree with Plan of Care  Patient       Patient will benefit from skilled therapeutic intervention in order to improve the following deficits and impairments:  Decreased balance, Decreased mobility, Impaired tone, Decreased range of motion, Decreased activity tolerance, Decreased knowledge of precautions, Decreased coordination, Decreased knowledge of use of DME, Decreased safety awareness, Decreased strength, Impaired UE functional use, Improper body mechanics, Improper spinal/pelvic alignment  Visit Diagnosis: Other symptoms and signs involving the musculoskeletal system  Other symptoms and signs involving the nervous system  Unsteadiness on feet  Other lack of coordination  Abnormal posture    Problem List Patient Active Problem List   Diagnosis Date Noted  . Parkinson's disease ([place]) 11/26/2016  . Parkinsonism ([place]) 10/17/2016  . Fatigue 10/17/2016  . Long term (current) use of anticoagulants 05/29/2016  . Left wrist pain 01/03/2016  . Left hand pain 01/03/2016  . 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 Tremor, unspecified 10/19/2015  . Osteopenia 10/19/2015  . 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 Closed fracture of part of upper end of humerus 05/01/2015  . Hematuria 02/04/2014  . 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 Cervical pain (neck) 06/20/2010  . Atrial fibrillation ([place]) 05/21/2010  . 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 Menopausal and postmenopausal disorder 02/16/2007  . DEGENERATIVE JOINT DISEASE, RIGHT HIP 02/16/2007    Stephanie Shaw, OTR/L 12/03/2016, 10:22 AM  [facility] [address] [place], [place], [address] Phone: [phone]   Fax:  [phone]  Name: Stephanie Shaw MRN: [id number] Date of Birth: 12-23-1940

## 2016-12-04 ENCOUNTER — Ambulatory Visit: Payer: Medicare Other | Admitting: Occupational Therapy

## 2016-12-04 ENCOUNTER — Encounter: Payer: Self-pay | Admitting: Occupational Therapy

## 2016-12-04 DIAGNOSIS — R251 Tremor, unspecified: Secondary | ICD-10-CM

## 2016-12-04 DIAGNOSIS — R2689 Other abnormalities of gait and mobility: Secondary | ICD-10-CM

## 2016-12-04 DIAGNOSIS — M6281 Muscle weakness (generalized): Secondary | ICD-10-CM

## 2016-12-04 DIAGNOSIS — R29818 Other symptoms and signs involving the nervous system: Secondary | ICD-10-CM | POA: Diagnosis not present

## 2016-12-04 DIAGNOSIS — R278 Other lack of coordination: Secondary | ICD-10-CM

## 2016-12-04 DIAGNOSIS — R2681 Unsteadiness on feet: Secondary | ICD-10-CM

## 2016-12-04 DIAGNOSIS — R29898 Other symptoms and signs involving the musculoskeletal system: Secondary | ICD-10-CM

## 2016-12-04 DIAGNOSIS — R293 Abnormal posture: Secondary | ICD-10-CM

## 2016-12-04 NOTE — Therapy (Signed)
[facility] [address] [place], [place], [address] Phone: [phone]   Fax:  [phone]  Occupational Therapy Treatment  Patient Details  Name: Stephanie Shaw MRN: [id number] Date of Birth: 1940-09-26 Referring Provider: Dr. Wells Guiles [name]    Encounter Date: 12/04/2016  OT End of Session - 12/04/16 0859    Visit Number  10    Number of Visits  17    Date for OT Re-Evaluation  12/28/16    Authorization Type  [employer] Medicare, no visit limit, no auth needed.  G-code needed    Authorization - Visit Number  10    Authorization - Number of Visits  10    OT Start Time  [phone]    OT Stop Time  0930    OT Time Calculation (min)  43 min    Activity Tolerance  Patient tolerated treatment well    Behavior During Therapy  WFL for tasks assessed/performed       Past Medical History:  Diagnosis Date  . Colles' fracture of right radius 03/05/2015  . DEGENERATIVE JOINT DISEASE, RIGHT HIP 02/16/2007  . Dupuytren's contracture   . Osteoarthritis of hip    right  . Osteoarthritis of left knee   . POSTMENOPAUSAL SYNDROME 02/16/2007  . PREMATURE ATRIAL CONTRACTIONS 02/16/2007  . S/P breast biopsy, left    two o'clock position - benign  . 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 Venous insufficiency     Past Surgical History:  Procedure Laterality Date  . BREAST BIOPSY Left   . CATARACT EXTRACTION, BILATERAL      There were no vitals filed for this visit.  Subjective Assessment - 12/04/16 0856    Subjective   will incr meds tonight.  Is feeling a little better.    Pertinent History  possible Parkinsonism, hx of multiple falls (fx with 2 falls), degenerative joint disease R hip, a-fib, nontoxic multinodular goiter, hx of L humerus fx 2017, osteopenia, R wrist fx 2017    Limitations  fall risk    Patient Stated Goals  improve control and strength of LUE    Currently in Pain?  No/denies        Practiced buttoning/unbuttoning  shirt on table top with min cues for use of PWR! Hands prior to buttoning and use of deliberate/large amplitude movements for fastening/unfastening after instruction.  Pt demo improvement with repetition and use of large amplitude movements.  Practiced donning/doffing jacket using large amplitude movement strategies after initial instruction.  Pt demo improvement with repetition and use/min cues for large amplitude movements.   Reassessed box and blocks, unfastening/fastening 3 buttons, and PPT#4--see goals section below   In standing, functional step and reach forward/back at diagonal with trunk rotation/wt. Shift and overhead reach with each UE/LE.  Pt needed min-mod cueing for large amplitude movements (wt. Shift/step, rotation, and PWR! Hands/reach, posture).  Emphasized using these strategies for IADLs at home.                 OT Education - 12/03/16 [phone]    Education provided  Yes    Education Details  Bag exercises for dressing--Reviewed   Person(s) Educated  Patient    Methods  Explanation;Demonstration;Handout    Comprehension  Verbalized understanding;Returned demonstration;Verbal cues required       OT Short Term Goals - 12/04/16 0916      OT SHORT TERM GOAL #1   Title  Pt will be independent with HEP.--check STGs 11/28/16  Time  4    Period  Weeks    Status  On-going 11/27/16:  met for current, but would benefit from additional updates      OT [place] #2   Title  Pt will demo large amplitude movement strategies during transitional movements (bending, reaching, carrying objects, turning) with only occasional min v.c.    Time  4    Period  Weeks    Status  On-going 11/27/16:  consistent min to occasional mod v.c.      OT SHORT TERM GOAL #3   Title  Pt will report improved ability to wash hair with LUE.    Time  4    Period  Weeks    Status  Achieved 12/07/16  met      OT SHORT TERM GOAL #4   Title  Pt will be able to write at least 3 sentences  with only mild micrographia.    Time  4    Period  Weeks    Status  Achieved 11/27/16      OT SHORT TERM GOAL #5   Title  Pt will verbalize understanding of ways to prevent future complications and appropriate community resources.    Time  4    Period  Weeks    Status  Achieved 11/27/16 met      OT SHORT TERM GOAL #6   Title  Pt will improve functional reaching/coordination as shown by improving score on box and blocks test by at least 4 bilaterally.    Baseline  R-36, L-31 blocks    Time  4    Period  Weeks    Status  On-going 12/04/16:  R-40blocks, L-33blocks        OT Long Term Goals - 12/04/16 0918      OT LONG TERM GOAL #1   Title  Pt will verbalize understanding of adative strategies/AE to incr safety/ease with ADLs/IADLs.--check LTGs 12/28/16    Time  8    Period  Weeks    Status  On-going      OT LONG TERM GOAL #2   Title  Pt will improve L hand coordination for ADLs as shown by improving time on 9-hole peg test by at least 5sec.    Baseline  45.6sec    Time  8    Period  Weeks    Status  On-going 12/04/16--35.0sec, 29.47sec      OT LONG TERM GOAL #3   Title  Pt will improve balance for IADLs as shown by improving standing functional reach to at least 10" bilaterally.    Baseline  R-8', L-8.5"    Time  8    Period  Weeks    Status  On-going      OT LONG TERM GOAL #4   Title  Pt will improve L grip strength by least 5lbs to assist with holding objects/opening containers.    Baseline  R-35lbs, L-20lbs    Time  8    Period  Weeks    Status  New      OT LONG TERM GOAL #5   Title  Pt will improve PPT#4 by at least 5 sec for incr ease with dressing.    Baseline  24.31sec    Time  8    Period  Weeks    Status  Achieved 12/04/16:  17.41sec       OT LONG TERM GOAL #6   Title  Pt will be able to fasten/unfasten 3  buttons in less than 37sec for incr ease with dressing.--2016/12/31 updated to less than 30sec     Baseline  45.60    Time  8    Period  Weeks     Status  Revised 12-31-2016  improved, but not consistent      OT LONG TERM GOAL #7   Title  Pt will improve functional reaching/coordination as shown by improving score on box and blocks test by at least 8 bilaterally.    Baseline  R-36, L-31 blocks    Time  8    Period  Weeks    Status  On-going            Plan - 12-31-2016 0859    Clinical Impression Statement  Pt is making good progress towards goals, but continues to need cueing/reinforcement to use large amplitude movement strategies and maintain large amplitude movements for functional tasks.  Pt would benefit from continued occupational therapy to reinforce strategies and continue to update HEP for incr calibration and carryover with ADLs.    Rehab Potential  Good    OT Frequency  2x / week    OT Duration  8 weeks    OT Treatment/Interventions  Self-care/ADL training;Cryotherapy;Parrafin;Therapeutic exercise;DME and/or AE instruction;Therapist, nutritional;Therapeutic activities;Patient/family education;Balance training;Manual Therapy;Neuromuscular education;Fluidtherapy;Ultrasound;Moist Heat;Energy conservation;Passive range of motion;Therapeutic exercises    Plan  continue to address large amplitude movement strategies for ADLs, PWR! moves, functional reaching with balance components    OT Home Exercise Plan  Education provided:  PWR! moves in supine; PWR! hands (basic 4), bag exercises    Consulted and Agree with Plan of Care  Patient       Patient will benefit from skilled therapeutic intervention in order to improve the following deficits and impairments:  Decreased balance, Decreased mobility, Impaired tone, Decreased range of motion, Decreased activity tolerance, Decreased knowledge of precautions, Decreased coordination, Decreased knowledge of use of DME, Decreased safety awareness, Decreased strength, Impaired UE functional use, Improper body mechanics, Improper spinal/pelvic alignment  Visit Diagnosis: Other symptoms  and signs involving the musculoskeletal system  Other symptoms and signs involving the nervous system  Unsteadiness on feet  Other lack of coordination  Abnormal posture  Other abnormalities of gait and mobility  Muscle weakness (generalized)  Tremor  G-Codes - 2016/12/31 0900    Functional Assessment Tool Used (Outpatient only)  PPT#4:  17.41sec.  fastening/unfastening 3 buttons in 35.0sec, 29.47sec., improved ability to wash hair with LUE, 3 sentences with mild micrographia    Functional Limitation  Self care    Self Care Current Status (S9628)  At least 20 percent but less than 40 percent impaired, limited or restricted    Self Care Goal Status (Z6629)  At least 1 percent but less than 20 percent impaired, limited or restricted       Problem List Patient Active Problem List   Diagnosis Date Noted  . Parkinson's disease ([place]) 11/26/2016  . Parkinsonism ([place]) 10/17/2016  . Fatigue 10/17/2016  . Long term (current) use of anticoagulants 05/29/2016  . Left wrist pain 01/03/2016  . Left hand pain 01/03/2016  . Vaginal atrophy 10/19/2015  . Tremor, unspecified 10/19/2015  . Osteopenia 10/19/2015  . 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 Closed fracture of part of upper end of humerus 05/01/2015  . Hematuria 02/04/2014  . 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 Cervical pain (neck) 06/20/2010  . Atrial fibrillation ([place]) 05/21/2010  . 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 Menopausal and  postmenopausal disorder 02/16/2007  . DEGENERATIVE JOINT DISEASE, RIGHT HIP 02/16/2007    [facility] 12/04/2016, 9:55 AM  [facility] [address] [place], [place], [address] Phone: [phone]   Fax:  [phone]  Name: Stephanie Shaw MRN: [id number] Date of Birth: 1940-09-26   Vianne Bulls, OTR/L [facility] [address] [place], [place]  [address] [phone]  phone [phone] 12/04/16 9:55 AM

## 2016-12-08 ENCOUNTER — Encounter: Payer: Self-pay | Admitting: Occupational Therapy

## 2016-12-08 ENCOUNTER — Ambulatory Visit: Payer: Medicare Other | Admitting: Occupational Therapy

## 2016-12-08 DIAGNOSIS — R278 Other lack of coordination: Secondary | ICD-10-CM

## 2016-12-08 DIAGNOSIS — R29818 Other symptoms and signs involving the nervous system: Secondary | ICD-10-CM | POA: Diagnosis not present

## 2016-12-08 DIAGNOSIS — M6281 Muscle weakness (generalized): Secondary | ICD-10-CM

## 2016-12-08 DIAGNOSIS — R251 Tremor, unspecified: Secondary | ICD-10-CM

## 2016-12-08 DIAGNOSIS — R2681 Unsteadiness on feet: Secondary | ICD-10-CM

## 2016-12-08 DIAGNOSIS — R293 Abnormal posture: Secondary | ICD-10-CM

## 2016-12-08 DIAGNOSIS — R2689 Other abnormalities of gait and mobility: Secondary | ICD-10-CM

## 2016-12-08 DIAGNOSIS — R29898 Other symptoms and signs involving the musculoskeletal system: Secondary | ICD-10-CM

## 2016-12-08 NOTE — Therapy (Signed)
[facility] [address] [place], [place], [address] Phone: [phone]   Fax:  [phone]  Occupational Therapy Treatment  Patient Details  Name: Stephanie Shaw MRN: [id number] Date of Birth: 1940-09-02 Referring Provider: Dr. Wells Guiles [name]    Encounter Date: 12/08/2016  OT End of Session - 12/08/16 0855    Visit Number  11    Number of Visits  17    Date for OT Re-Evaluation  12/28/16    Authorization Type  [employer] Medicare, no visit limit, no auth needed.  G-code needed    Authorization - Visit Number  11    Authorization - Number of Visits  20    OT Start Time  [phone]    OT Stop Time  0932    OT Time Calculation (min)  39 min    Activity Tolerance  Patient tolerated treatment well    Behavior During Therapy  WFL for tasks assessed/performed       Past Medical History:  Diagnosis Date  . Colles' fracture of right radius 03/05/2015  . DEGENERATIVE JOINT DISEASE, RIGHT HIP 02/16/2007  . Dupuytren's contracture   . Osteoarthritis of hip    right  . Osteoarthritis of left knee   . POSTMENOPAUSAL SYNDROME 02/16/2007  . PREMATURE ATRIAL CONTRACTIONS 02/16/2007  . S/P breast biopsy, left    two o'clock position - benign  . 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 Venous insufficiency     Past Surgical History:  Procedure Laterality Date  . BREAST BIOPSY Left   . CATARACT EXTRACTION, BILATERAL      There were no vitals filed for this visit.  Subjective Assessment - 12/08/16 0855    Subjective   This is hard to think and write    Pertinent History  possible Parkinsonism, hx of multiple falls (fx with 2 falls), degenerative joint disease R hip, a-fib, nontoxic multinodular goiter, hx of L humerus fx 2017, osteopenia, R wrist fx 2017    Limitations  fall risk    Patient Stated Goals  improve control and strength of LUE    Currently in Pain?  No/denies          Writing with category generation with min cueing  for cognitive component and letter size.  100% legibility and min decr in size.  Then continuous "L" with emphasis on size/spacing.  Writing 3 sentences that pt self-generated for cognitive component with good size/legibility.    Placing grooved pegs in pegboard with each hand for in-hand manipulation with min difficulty RUE and min-mod difficulty LUE, min cueing for use of PWR! Hands.  Simulated ADLs with bag with focus/min cues for large amplitude movements:  pulling bag into hand for clothing adjustment/donning socks  Min cueing for use of strategy for doffing jacket during session.                  OT Short Term Goals - 12/04/16 0916      OT SHORT TERM GOAL #1   Title  Pt will be independent with HEP.--check STGs 11/28/16    Time  4    Period  Weeks    Status  On-going 11/27/16:  met for current, but would benefit from additional updates      OT [place] #2   Title  Pt will demo large amplitude movement strategies during transitional movements (bending, reaching, carrying objects, turning) with only occasional min v.c.    Time  4  Period  Weeks    Status  On-going 11/27/16:  consistent min to occasional mod v.c.      OT SHORT TERM GOAL #3   Title  Pt will report improved ability to wash hair with LUE.    Time  4    Period  Weeks    Status  Achieved 12/07/16  met      OT SHORT TERM GOAL #4   Title  Pt will be able to write at least 3 sentences with only mild micrographia.    Time  4    Period  Weeks    Status  Achieved 11/27/16      OT SHORT TERM GOAL #5   Title  Pt will verbalize understanding of ways to prevent future complications and appropriate community resources.    Time  4    Period  Weeks    Status  Achieved 11/27/16 met      OT SHORT TERM GOAL #6   Title  Pt will improve functional reaching/coordination as shown by improving score on box and blocks test by at least 4 bilaterally.    Baseline  R-36, L-31 blocks    Time  4    Period  Weeks     Status  On-going 12/04/16:  R-40blocks, L-33blocks        OT Long Term Goals - 12/04/16 0918      OT LONG TERM GOAL #1   Title  Pt will verbalize understanding of adative strategies/AE to incr safety/ease with ADLs/IADLs.--check LTGs 12/28/16    Time  8    Period  Weeks    Status  On-going      OT LONG TERM GOAL #2   Title  Pt will improve L hand coordination for ADLs as shown by improving time on 9-hole peg test by at least 5sec.    Baseline  45.6sec    Time  8    Period  Weeks    Status  On-going 12/04/16--35.0sec, 29.47sec      OT LONG TERM GOAL #3   Title  Pt will improve balance for IADLs as shown by improving standing functional reach to at least 10" bilaterally.    Baseline  R-8', L-8.5"    Time  8    Period  Weeks    Status  On-going      OT LONG TERM GOAL #4   Title  Pt will improve L grip strength by least 5lbs to assist with holding objects/opening containers.    Baseline  R-35lbs, L-20lbs    Time  8    Period  Weeks    Status  New      OT LONG TERM GOAL #5   Title  Pt will improve PPT#4 by at least 5 sec for incr ease with dressing.    Baseline  24.31sec    Time  8    Period  Weeks    Status  Achieved 12/04/16:  17.41sec       OT LONG TERM GOAL #6   Title  Pt will be able to fasten/unfasten 3 buttons in less than 37sec for incr ease with dressing.--12/04/16 updated to less than 30sec     Baseline  45.60    Time  8    Period  Weeks    Status  Revised 12/04/16  improved, but not consistent      OT LONG TERM GOAL #7   Title  Pt will improve functional reaching/coordination as shown by improving  score on box and blocks test by at least 8 bilaterally.    Baseline  R-36, L-31 blocks    Time  8    Period  Weeks    Status  On-going            Plan - 12/08/16 0856    Clinical Impression Statement  Pt continues to progres towards goals.  Pt needs repetition to incr carryover.    Rehab Potential  Good    OT Frequency  2x / week    OT Duration  8  weeks    OT Treatment/Interventions  Self-care/ADL training;Cryotherapy;Parrafin;Therapeutic exercise;DME and/or AE instruction;Therapist, nutritional;Therapeutic activities;Patient/family education;Balance training;Manual Therapy;Neuromuscular education;Fluidtherapy;Ultrasound;Moist Heat;Energy conservation;Passive range of motion;Therapeutic exercises    Plan  PWR! moves in quadruped, functional reaching with balance components, large amplitude movements, ?Arm bike    OT Home Exercise Plan  Education provided:  PWR! moves in supine; PWR! hands (basic 4), bag exercises    Consulted and Agree with Plan of Care  Patient       Patient will benefit from skilled therapeutic intervention in order to improve the following deficits and impairments:  Decreased balance, Decreased mobility, Impaired tone, Decreased range of motion, Decreased activity tolerance, Decreased knowledge of precautions, Decreased coordination, Decreased knowledge of use of DME, Decreased safety awareness, Decreased strength, Impaired UE functional use, Improper body mechanics, Improper spinal/pelvic alignment  Visit Diagnosis: Other symptoms and signs involving the musculoskeletal system  Other symptoms and signs involving the nervous system  Unsteadiness on feet  Other lack of coordination  Abnormal posture  Other abnormalities of gait and mobility  Muscle weakness (generalized)  Tremor    Problem List Patient Active Problem List   Diagnosis Date Noted  . Parkinson's disease ([place]) 11/26/2016  . Parkinsonism ([place]) 10/17/2016  . Fatigue 10/17/2016  . Long term (current) use of anticoagulants 05/29/2016  . Left wrist pain 01/03/2016  . Left hand pain 01/03/2016  . Vaginal atrophy 10/19/2015  . Tremor, unspecified 10/19/2015  . Osteopenia 10/19/2015  . 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 Closed fracture of part of upper end of humerus 05/01/2015  . Hematuria 02/04/2014  . Nontoxic  multinodular goiter 07/09/2010  . Cervical pain (neck) 06/20/2010  . Atrial fibrillation ([place]) 05/21/2010  . 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 Menopausal and postmenopausal disorder 02/16/2007  . DEGENERATIVE JOINT DISEASE, RIGHT HIP 02/16/2007    [facility] 12/08/2016, 9:46 AM  [facility] [address] [place], [place], [address] Phone: [phone]   Fax:  [phone]  Name: Stephanie Shaw MRN: [id number] Date of Birth: 08-04-1940   Vianne Bulls, OTR/L [facility] [address] [place], [place]  [address] [phone] phone [phone] 12/08/16 9:46 AM

## 2016-12-09 ENCOUNTER — Ambulatory Visit: Payer: Medicare Other | Admitting: Occupational Therapy

## 2016-12-09 ENCOUNTER — Encounter: Payer: Self-pay | Admitting: Occupational Therapy

## 2016-12-09 DIAGNOSIS — R2681 Unsteadiness on feet: Secondary | ICD-10-CM

## 2016-12-09 DIAGNOSIS — R278 Other lack of coordination: Secondary | ICD-10-CM

## 2016-12-09 DIAGNOSIS — R29898 Other symptoms and signs involving the musculoskeletal system: Secondary | ICD-10-CM

## 2016-12-09 DIAGNOSIS — M6281 Muscle weakness (generalized): Secondary | ICD-10-CM

## 2016-12-09 DIAGNOSIS — R29818 Other symptoms and signs involving the nervous system: Secondary | ICD-10-CM

## 2016-12-09 DIAGNOSIS — R2689 Other abnormalities of gait and mobility: Secondary | ICD-10-CM

## 2016-12-09 DIAGNOSIS — R293 Abnormal posture: Secondary | ICD-10-CM

## 2016-12-09 NOTE — Patient Instructions (Signed)
(  Exercise) Monday Tuesday Wednesday Thursday Friday Saturday Sunday   PWR! sitting           PWR! Lying on back           PWR! standing           PWR! Hands/knees on floor           PWR! Lying on stomach           PWR! hands           Coordination           Balance exercises           Bag exercises

## 2016-12-09 NOTE — Therapy (Signed)
[facility] [address] [place], [place], [address] Phone: [phone]   Fax:  [phone]  Occupational Therapy Treatment  Patient Details  Name: Stephanie Shaw MRN: [id number] Date of Birth: Aug 13, 1940 Referring Provider: Dr. Wells Guiles [name]    Encounter Date: 12/09/2016  OT End of Session - 12/09/16 0813    Visit Number  12    Number of Visits  17    Date for OT Re-Evaluation  12/28/16    Authorization Type  [employer] Medicare, no visit limit, no auth needed.  G-code needed    Authorization - Visit Number  12    Authorization - Number of Visits  20    OT Start Time  0805    OT Stop Time  [phone]    OT Time Calculation (min)  47 min    Activity Tolerance  Patient tolerated treatment well    Behavior During Therapy  WFL for tasks assessed/performed       Past Medical History:  Diagnosis Date  . Colles' fracture of right radius 03/05/2015  . DEGENERATIVE JOINT DISEASE, RIGHT HIP 02/16/2007  . Dupuytren's contracture   . Osteoarthritis of hip    right  . Osteoarthritis of left knee   . POSTMENOPAUSAL SYNDROME 02/16/2007  . PREMATURE ATRIAL CONTRACTIONS 02/16/2007  . S/P breast biopsy, left    two o'clock position - benign  . 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 Venous insufficiency     Past Surgical History:  Procedure Laterality Date  . BREAST BIOPSY Left   . CATARACT EXTRACTION, BILATERAL      There were no vitals filed for this visit.  Subjective Assessment - 12/09/16 0809    Pertinent History  possible Parkinsonism, hx of multiple falls (fx with 2 falls), degenerative joint disease R hip, a-fib, nontoxic multinodular goiter, hx of L humerus fx 2017, osteopenia, R wrist fx 2017    Limitations  fall risk    Patient Stated Goals  improve control and strength of LUE    Currently in Pain?  No/denies          Verbally reviewed sitting PWR! Moves and issued handout (pt been performing in class).  Will  formally review next session.                 OT Education - 12/09/16 0858    Education Details  PWR! moves in quadruped and prone (basic 4) x10-20 each; Use of PD ex chart and developed "sample" week incorporating all exercises and classes (PWR!, eventual cycling) and walking    Person(s) Educated  Patient    Methods  Explanation;Demonstration;Verbal cues;Handout    Comprehension  Verbalized understanding;Returned demonstration;Verbal cues required min cueing for incr movement amplitude/shape technique   min cueing for incr movement amplitude/shape technique      OT Short Term Goals - 12/04/16 0916      OT SHORT TERM GOAL #1   Title  Pt will be independent with HEP.--check STGs 11/28/16    Time  4    Period  Weeks    Status  On-going 11/27/16:  met for current, but would benefit from additional updates      OT [place] #2   Title  Pt will demo large amplitude movement strategies during transitional movements (bending, reaching, carrying objects, turning) with only occasional min v.c.    Time  4    Period  Weeks    Status  On-going 11/27/16:  consistent min to  occasional mod v.c.      OT SHORT TERM GOAL #3   Title  Pt will report improved ability to wash hair with LUE.    Time  4    Period  Weeks    Status  Achieved 12/07/16  met      OT SHORT TERM GOAL #4   Title  Pt will be able to write at least 3 sentences with only mild micrographia.    Time  4    Period  Weeks    Status  Achieved 11/27/16      OT SHORT TERM GOAL #5   Title  Pt will verbalize understanding of ways to prevent future complications and appropriate community resources.    Time  4    Period  Weeks    Status  Achieved 11/27/16 met      OT SHORT TERM GOAL #6   Title  Pt will improve functional reaching/coordination as shown by improving score on box and blocks test by at least 4 bilaterally.    Baseline  R-36, L-31 blocks    Time  4    Period  Weeks    Status  On-going 12/04/16:   R-40blocks, L-33blocks        OT Long Term Goals - 12/04/16 0918      OT LONG TERM GOAL #1   Title  Pt will verbalize understanding of adative strategies/AE to incr safety/ease with ADLs/IADLs.--check LTGs 12/28/16    Time  8    Period  Weeks    Status  On-going      OT LONG TERM GOAL #2   Title  Pt will improve L hand coordination for ADLs as shown by improving time on 9-hole peg test by at least 5sec.    Baseline  45.6sec    Time  8    Period  Weeks    Status  On-going 12/04/16--35.0sec, 29.47sec      OT LONG TERM GOAL #3   Title  Pt will improve balance for IADLs as shown by improving standing functional reach to at least 10" bilaterally.    Baseline  R-8', L-8.5"    Time  8    Period  Weeks    Status  On-going      OT LONG TERM GOAL #4   Title  Pt will improve L grip strength by least 5lbs to assist with holding objects/opening containers.    Baseline  R-35lbs, L-20lbs    Time  8    Period  Weeks    Status  New      OT LONG TERM GOAL #5   Title  Pt will improve PPT#4 by at least 5 sec for incr ease with dressing.    Baseline  24.31sec    Time  8    Period  Weeks    Status  Achieved 12/04/16:  17.41sec       OT LONG TERM GOAL #6   Title  Pt will be able to fasten/unfasten 3 buttons in less than 37sec for incr ease with dressing.--12/04/16 updated to less than 30sec     Baseline  45.60    Time  8    Period  Weeks    Status  Revised 12/04/16  improved, but not consistent      OT LONG TERM GOAL #7   Title  Pt will improve functional reaching/coordination as shown by improving score on box and blocks test by at least 8 bilaterally.  Baseline  R-36, L-31 blocks    Time  8    Period  Weeks    Status  On-going            Plan - 12/09/16 0813    Clinical Impression Statement  Pt continues to progress towards goals and is able to perform PWR! moves in prone and quadruped with min cues for incr movement amplitude/shaping technique.  Pt also verbalized  understanding of use of PD exercise chart.    Rehab Potential  Good    OT Frequency  2x / week    OT Duration  8 weeks    OT Treatment/Interventions  Self-care/ADL training;Cryotherapy;Parrafin;Therapeutic exercise;DME and/or AE instruction;Therapist, nutritional;Therapeutic activities;Patient/family education;Balance training;Manual Therapy;Neuromuscular education;Fluidtherapy;Ultrasound;Moist Heat;Energy conservation;Passive range of motion;Therapeutic exercises    Plan  PWR! moves in sitting, functional reaching with balance components, large amplitude movements, ?arm bike    OT Home Exercise Plan  Education provided:  PWR! moves in supine; PWR! hands (basic 4), bag exercises    Consulted and Agree with Plan of Care  Patient       Patient will benefit from skilled therapeutic intervention in order to improve the following deficits and impairments:  Decreased balance, Decreased mobility, Impaired tone, Decreased range of motion, Decreased activity tolerance, Decreased knowledge of precautions, Decreased coordination, Decreased knowledge of use of DME, Decreased safety awareness, Decreased strength, Impaired UE functional use, Improper body mechanics, Improper spinal/pelvic alignment  Visit Diagnosis: Other symptoms and signs involving the musculoskeletal system  Other symptoms and signs involving the nervous system  Unsteadiness on feet  Other lack of coordination  Abnormal posture  Other abnormalities of gait and mobility  Muscle weakness (generalized)    Problem List Patient Active Problem List   Diagnosis Date Noted  . Parkinson's disease ([place]) 11/26/2016  . Parkinsonism ([place]) 10/17/2016  . Fatigue 10/17/2016  . Long term (current) use of anticoagulants 05/29/2016  . Left wrist pain 01/03/2016  . Left hand pain 01/03/2016  . Vaginal atrophy 10/19/2015  . Tremor, unspecified 10/19/2015  . Osteopenia 10/19/2015  . Varicose veins of both lower extremities 10/19/2015   . Closed fracture of part of upper end of humerus 05/01/2015  . Hematuria 02/04/2014  . 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 Cervical pain (neck) 06/20/2010  . Atrial fibrillation ([place]) 05/21/2010  . 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 Menopausal and postmenopausal disorder 02/16/2007  . DEGENERATIVE JOINT DISEASE, RIGHT HIP 02/16/2007    [facility] 12/09/2016, 9:00 AM  [facility] [address] [place], [place], [address] Phone: [phone]   Fax:  [phone]  Name: Stephanie Shaw MRN: [id number] Date of Birth: [birth year]   Vianne Bulls, OTR/L [facility] [address] [place], [place]  [address] [phone] phone [phone] 12/09/16 9:00 AM

## 2016-12-15 ENCOUNTER — Encounter: Payer: Self-pay | Admitting: Occupational Therapy

## 2016-12-15 ENCOUNTER — Ambulatory Visit: Payer: Medicare Other | Admitting: Occupational Therapy

## 2016-12-15 DIAGNOSIS — R2681 Unsteadiness on feet: Secondary | ICD-10-CM

## 2016-12-15 DIAGNOSIS — R29818 Other symptoms and signs involving the nervous system: Secondary | ICD-10-CM

## 2016-12-15 DIAGNOSIS — R278 Other lack of coordination: Secondary | ICD-10-CM

## 2016-12-15 DIAGNOSIS — R29898 Other symptoms and signs involving the musculoskeletal system: Secondary | ICD-10-CM

## 2016-12-15 DIAGNOSIS — R293 Abnormal posture: Secondary | ICD-10-CM

## 2016-12-15 DIAGNOSIS — R251 Tremor, unspecified: Secondary | ICD-10-CM

## 2016-12-15 DIAGNOSIS — M6281 Muscle weakness (generalized): Secondary | ICD-10-CM

## 2016-12-15 DIAGNOSIS — R2689 Other abnormalities of gait and mobility: Secondary | ICD-10-CM

## 2016-12-15 NOTE — Therapy (Signed)
[facility] [address] [place], [place], [address] Phone: [phone]   Fax:  [phone]  Occupational Therapy Treatment  Patient Details  Name: Stephanie Shaw MRN: [id number] Date of Birth: 1940-07-06 Referring Provider: Dr. Wells Guiles [name]    Encounter Date: 12/15/2016  OT End of Session - 12/15/16 0851    Visit Number  13    Number of Visits  17    Date for OT Re-Evaluation  12/28/16    Authorization Type  [employer] Medicare, no visit limit, no auth needed.  G-code needed    Authorization - Visit Number  13    Authorization - Number of Visits  20    OT Start Time  [phone]    OT Stop Time  0933    OT Time Calculation (min)  44 min    Activity Tolerance  Patient tolerated treatment well    Behavior During Therapy  WFL for tasks assessed/performed       Past Medical History:  Diagnosis Date  . Colles' fracture of right radius 03/05/2015  . DEGENERATIVE JOINT DISEASE, RIGHT HIP 02/16/2007  . Dupuytren's contracture   . Osteoarthritis of hip    right  . Osteoarthritis of left knee   . POSTMENOPAUSAL SYNDROME 02/16/2007  . PREMATURE ATRIAL CONTRACTIONS 02/16/2007  . S/P breast biopsy, left    two o'clock position - benign  . 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 Venous insufficiency     Past Surgical History:  Procedure Laterality Date  . BREAST BIOPSY Left   . CATARACT EXTRACTION, BILATERAL      There were no vitals filed for this visit.  Subjective Assessment - 12/15/16 0850    Subjective   feels like medication is helping some    Pertinent History  possible Parkinsonism, hx of multiple falls (fx with 2 falls), degenerative joint disease R hip, a-fib, nontoxic multinodular goiter, hx of L humerus fx 2017, osteopenia, R wrist fx 2017    Limitations  fall risk    Patient Stated Goals  improve control and strength of LUE    Currently in Pain?  No/denies       PWR! Moves (basic 4) in sitting x 20 each with  min cues For incr movement amplitude.  Arm bike x5mn level 1 for reciprocal movement with cues/target of at least 40rpms for intensity while maintaining movement amplitude/reciprocal movement.   Pt maintained 35-40rpms.  Placing small pegs to copy design with 100% accuracy and min cueing for use of PWR! Hands.  Sliding cards across table using PWR! Hands for finger ext/large amplitude with each hand.  Retrieving "misses" with min-mod cueing for incr movement amplitude.                          OT Short Term Goals - 12/04/16 0916      OT SHORT TERM GOAL #1   Title  Pt will be independent with HEP.--check STGs 11/28/16    Time  4    Period  Weeks    Status  On-going 11/27/16:  met for current, but would benefit from additional updates      OT [place]#2   Title  Pt will demo large amplitude movement strategies during transitional movements (bending, reaching, carrying objects, turning) with only occasional min v.c.    Time  4    Period  Weeks    Status  On-going 11/27/16:  consistent min to  occasional mod v.c.      OT SHORT TERM GOAL #3   Title  Pt will report improved ability to wash hair with LUE.    Time  4    Period  Weeks    Status  Achieved 12/07/16  met      OT SHORT TERM GOAL #4   Title  Pt will be able to write at least 3 sentences with only mild micrographia.    Time  4    Period  Weeks    Status  Achieved 11/27/16      OT SHORT TERM GOAL #5   Title  Pt will verbalize understanding of ways to prevent future complications and appropriate community resources.    Time  4    Period  Weeks    Status  Achieved 11/27/16 met      OT SHORT TERM GOAL #6   Title  Pt will improve functional reaching/coordination as shown by improving score on box and blocks test by at least 4 bilaterally.    Baseline  R-36, L-31 blocks    Time  4    Period  Weeks    Status  On-going 12/04/16:  R-40blocks, L-33blocks        OT Long Term Goals - 12/04/16 0918       OT LONG TERM GOAL #1   Title  Pt will verbalize understanding of adative strategies/AE to incr safety/ease with ADLs/IADLs.--check LTGs 12/28/16    Time  8    Period  Weeks    Status  On-going      OT LONG TERM GOAL #2   Title  Pt will improve L hand coordination for ADLs as shown by improving time on 9-hole peg test by at least 5sec.    Baseline  45.6sec    Time  8    Period  Weeks    Status  On-going 12/04/16--35.0sec, 29.47sec      OT LONG TERM GOAL #3   Title  Pt will improve balance for IADLs as shown by improving standing functional reach to at least 10" bilaterally.    Baseline  R-8', L-8.5"    Time  8    Period  Weeks    Status  On-going      OT LONG TERM GOAL #4   Title  Pt will improve L grip strength by least 5lbs to assist with holding objects/opening containers.    Baseline  R-35lbs, L-20lbs    Time  8    Period  Weeks    Status  New      OT LONG TERM GOAL #5   Title  Pt will improve PPT#4 by at least 5 sec for incr ease with dressing.    Baseline  24.31sec    Time  8    Period  Weeks    Status  Achieved 12/04/16:  17.41sec       OT LONG TERM GOAL #6   Title  Pt will be able to fasten/unfasten 3 buttons in less than 37sec for incr ease with dressing.--12/04/16 updated to less than 30sec     Baseline  45.60    Time  8    Period  Weeks    Status  Revised 12/04/16  improved, but not consistent      OT LONG TERM GOAL #7   Title  Pt will improve functional reaching/coordination as shown by improving score on box and blocks test by at least 8 bilaterally.  Baseline  R-36, L-31 blocks    Time  8    Period  Weeks    Status  On-going            Plan - 12/15/16 0998    Clinical Impression Statement  Pt is progress towards goals with improving movement amplitude.  Pt has been trying to incr variety with HEP, but has not tried exercise chart yet.    Rehab Potential  Good    OT Frequency  2x / week    OT Duration  8 weeks    OT Treatment/Interventions   Self-care/ADL training;Cryotherapy;Parrafin;Therapeutic exercise;DME and/or AE instruction;Therapist, nutritional;Therapeutic activities;Patient/family education;Balance training;Manual Therapy;Neuromuscular education;Fluidtherapy;Ultrasound;Moist Heat;Energy conservation;Passive range of motion;Therapeutic exercises    Plan  large amplitude movements for functional tasks; functional reaching with balance components; begin to discuss renewal vs. d/c next week    OT Home Exercise Plan  Education provided:  PWR! moves in supine; PWR! hands (basic 4), bag exercises    Consulted and Agree with Plan of Care  Patient       Patient will benefit from skilled therapeutic intervention in order to improve the following deficits and impairments:  Decreased balance, Decreased mobility, Impaired tone, Decreased range of motion, Decreased activity tolerance, Decreased knowledge of precautions, Decreased coordination, Decreased knowledge of use of DME, Decreased safety awareness, Decreased strength, Impaired UE functional use, Improper body mechanics, Improper spinal/pelvic alignment  Visit Diagnosis: Other symptoms and signs involving the musculoskeletal system  Other symptoms and signs involving the nervous system  Unsteadiness on feet  Other lack of coordination  Abnormal posture  Other abnormalities of gait and mobility  Tremor  Muscle weakness (generalized)    Problem List Patient Active Problem List   Diagnosis Date Noted  . Parkinson's disease ([place]) 11/26/2016  . Parkinsonism ([place]) 10/17/2016  . Fatigue 10/17/2016  . Long term (current) use of anticoagulants 05/29/2016  . Left wrist pain 01/03/2016  . Left hand pain 01/03/2016  . Vaginal atrophy 10/19/2015  . Tremor, unspecified 10/19/2015  . Osteopenia 10/19/2015  . 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 Closed fracture of part of upper end of humerus 05/01/2015  . Hematuria 02/04/2014  . Nontoxic multinodular  goiter 07/09/2010  . Cervical pain (neck) 06/20/2010  . Atrial fibrillation ([place]) 05/21/2010  . 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 Menopausal and postmenopausal disorder 02/16/2007  . DEGENERATIVE JOINT DISEASE, RIGHT HIP 02/16/2007    [facility] 12/15/2016, 9:33 AM  [facility] [address] [place], [place], [address] Phone: [phone]   Fax:  [phone]  Name: Stephanie Shaw MRN: [id number] Date of Birth: March 18, 1940   Vianne Bulls, OTR/L [facility] [address] [place], [place]  [address] [phone] phone [phone] 12/15/16 9:33 AM

## 2016-12-18 ENCOUNTER — Ambulatory Visit: Payer: Medicare Other | Admitting: Occupational Therapy

## 2016-12-18 ENCOUNTER — Telehealth: Payer: Self-pay | Admitting: Neurology

## 2016-12-18 ENCOUNTER — Encounter: Payer: Self-pay | Admitting: Occupational Therapy

## 2016-12-18 DIAGNOSIS — R2689 Other abnormalities of gait and mobility: Secondary | ICD-10-CM

## 2016-12-18 DIAGNOSIS — R293 Abnormal posture: Secondary | ICD-10-CM

## 2016-12-18 DIAGNOSIS — R29818 Other symptoms and signs involving the nervous system: Secondary | ICD-10-CM

## 2016-12-18 DIAGNOSIS — R29898 Other symptoms and signs involving the musculoskeletal system: Secondary | ICD-10-CM

## 2016-12-18 DIAGNOSIS — R278 Other lack of coordination: Secondary | ICD-10-CM

## 2016-12-18 DIAGNOSIS — M6281 Muscle weakness (generalized): Secondary | ICD-10-CM

## 2016-12-18 DIAGNOSIS — R2681 Unsteadiness on feet: Secondary | ICD-10-CM

## 2016-12-18 NOTE — Telephone Encounter (Signed)
Patient called and would like to know if a [employer] Payment Form can be filled out and faxed for her? She asked could you let her know once that has been done? Thanks

## 2016-12-18 NOTE — Therapy (Addendum)
[facility] [address] [place], [place], [address] Phone: [phone]   Fax:  [phone]  Occupational Therapy Treatment  Patient Details  Name: Stephanie Shaw MRN: [id number] Date of Birth: 02/25/1940 Referring Provider: Dr. Wells Guiles [name]    Encounter Date: 12/18/2016  OT End of Session - 12/18/16 0906    Visit Number  14    Number of Visits  17    Date for OT Re-Evaluation  12/28/16    Authorization Type  [employer] Medicare, no visit limit, no auth needed.  G-code needed    Authorization - Visit Number  14    Authorization - Number of Visits  20    OT Start Time  [phone]    OT Stop Time  0930    OT Time Calculation (min)  38 min    Activity Tolerance  Patient tolerated treatment well    Behavior During Therapy  WFL for tasks assessed/performed       Past Medical History:  Diagnosis Date  . Colles' fracture of right radius 03/05/2015  . DEGENERATIVE JOINT DISEASE, RIGHT HIP 02/16/2007  . Dupuytren's contracture   . Osteoarthritis of hip    right  . Osteoarthritis of left knee   . POSTMENOPAUSAL SYNDROME 02/16/2007  . PREMATURE ATRIAL CONTRACTIONS 02/16/2007  . S/P breast biopsy, left    two o'clock position - benign  . 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 Venous insufficiency     Past Surgical History:  Procedure Laterality Date  . BREAST BIOPSY Left   . CATARACT EXTRACTION, BILATERAL      There were no vitals filed for this visit.  Subjective Assessment - 12/18/16 0856    Subjective   Pt reports today is full dose of medication.  Pt reports desire to d/c OT today and begin cycle class as well as PWR! Moves.    Pertinent History  possible Parkinsonism, hx of multiple falls (fx with 2 falls), degenerative joint disease R hip, a-fib, nontoxic multinodular goiter, hx of L humerus fx 2017, osteopenia, R wrist fx 2017    Limitations  fall risk    Patient Stated Goals  improve control and strength of LUE    Currently in Pain?  No/denies        Checked goals and discussed progress--see goals section below.    Reviewed use of PD exercise chart and appropriate community exercise classes.  Recommended pt call MD for referral/clearance for PD cycle class.  Pt also plans to continue with PWR! Moves class.    Reviewed ways to prevent future complications and screen/re-eval process and rationale/research behind process.  Pt verbalized understanding.  Due to pt question, reviewed timing of medication related to meals.    Reviewed functional mobility strategies (keeping feet apart, 1 foot in front of the other when retrieving from floor, good posture prior to moving when standing, and focus on heel strike with walking).  Pt returned demo with min cueing and verbalized understanding.  Reviewed strategy for buttoning and donning jacket.  Pt returned demo.  Pt educated in indications for further therapy earlier than scheduled screens and functional difficulties to be aware of/look for.  Pt also educated in benefits of speech therapy for PD.  Pt verbalized understanding.                   OT Short Term Goals - 12/18/16 0908      OT SHORT TERM GOAL #1   Title  Pt  will be independent with HEP.--check STGs 11/28/16    Time  4    Period  Weeks    Status  Achieved 11/27/16:  met for current, but would benefit from additional updates      OT [place] #2   Title  Pt will demo large amplitude movement strategies during transitional movements (bending, reaching, carrying objects, turning) with only occasional min v.c.    Time  4    Period  Weeks    Status  Partially Met 11/27/16:  consistent min to occasional mod v.c..  12/18/16:  min v.c.      OT SHORT TERM GOAL #3   Title  Pt will report improved ability to wash hair with LUE.    Time  4    Period  Weeks    Status  Achieved 12/07/16  met      OT SHORT TERM GOAL #4   Title  Pt will be able to write at least 3 sentences with only  mild micrographia.    Time  4    Period  Weeks    Status  Achieved 11/27/16      OT SHORT TERM GOAL #5   Title  Pt will verbalize understanding of ways to prevent future complications and appropriate community resources.    Time  4    Period  Weeks    Status  Achieved 11/27/16 met      OT SHORT TERM GOAL #6   Title  Pt will improve functional reaching/coordination as shown by improving score on box and blocks test by at least 4 bilaterally.    Baseline  R-36, L-31 blocks    Time  4    Period  Weeks    Status  Achieved 12/04/16:  R-40blocks, L-33blocks.  12/18/16:   R-41 blocks, L-36blocks        OT Long Term Goals - 12/18/16 0917      OT LONG TERM GOAL #1   Title  Pt will verbalize understanding of adative strategies/AE to incr safety/ease with ADLs/IADLs.--check LTGs 12/28/16    Time  8    Period  Weeks    Status  Achieved      OT LONG TERM GOAL #2   Title  Pt will improve L hand coordination for ADLs as shown by improving time on 9-hole peg test by at least 5sec.    Baseline  45.6sec    Time  8    Period  Weeks    Status  Achieved 12/04/16--35.0sec, 29.47sec.  12/18/16:  R-28.12sec, L-33.50sec       OT LONG TERM GOAL #3   Title  Pt will improve balance for IADLs as shown by improving standing functional reach to at least 10" bilaterally.    Baseline  R-8', L-8.5"    Time  8    Period  Weeks    Status  Not Met 12/18/16:  R-9", L-9"      OT LONG TERM GOAL #4   Title  Pt will improve L grip strength by least 5lbs to assist with holding objects/opening containers.    Baseline  R-35lbs, L-20lbs    Time  8    Period  Weeks    Status  Not Met 12/18/16:  L-24lbs      OT LONG TERM GOAL #5   Title  Pt will improve PPT#4 by at least 5 sec for incr ease with dressing.    Baseline  24.31sec    Time  8    Period  Weeks    Status  Achieved 12/04/16:  17.41sec       OT LONG TERM GOAL #6   Title  Pt will be able to fasten/unfasten 3 buttons in less than 37sec for incr ease  with dressing.--12/04/16 updated to less than 30sec     Baseline  45.60    Time  8    Period  Weeks    Status  Achieved 12/04/16  improved, but not consistent.  December 23, 2016:  33.87sec.      OT LONG TERM GOAL #7   Title  Pt will improve functional reaching/coordination as shown by improving score on box and blocks test by at least 8 bilaterally.    Baseline  R-36, L-31 blocks    Time  8    Period  Weeks    Status  Not Met 12-23-2016:  R-41, L-36            Plan - 23-Dec-2016 1142    Clinical Impression Statement  Pt pleased with progress and reports desire to d/c OT today and begin PD cycling class.      Rehab Potential  Good    OT Frequency  2x / week    OT Duration  8 weeks    OT Treatment/Interventions  Self-care/ADL training;Cryotherapy;Parrafin;Therapeutic exercise;DME and/or AE instruction;Therapist, nutritional;Therapeutic activities;Patient/family education;Balance training;Manual Therapy;Neuromuscular education;Fluidtherapy;Ultrasound;Moist Heat;Energy conservation;Passive range of motion;Therapeutic exercises    Plan  d/c OT    OT Home Exercise Plan  Education provided:  PWR! moves in supine; PWR! hands (basic 4), bag exercises    Consulted and Agree with Plan of Care  Patient       Patient will benefit from skilled therapeutic intervention in order to improve the following deficits and impairments:  Decreased balance, Decreased mobility, Impaired tone, Decreased range of motion, Decreased activity tolerance, Decreased knowledge of precautions, Decreased coordination, Decreased knowledge of use of DME, Decreased safety awareness, Decreased strength, Impaired UE functional use, Improper body mechanics, Improper spinal/pelvic alignment  Visit Diagnosis: Other symptoms and signs involving the nervous system  Other symptoms and signs involving the musculoskeletal system  Unsteadiness on feet  Other lack of coordination  Abnormal posture  Other abnormalities of gait and  mobility  Muscle weakness (generalized)  G-Codes - December 23, 2016 1144    Functional Assessment Tool Used (Outpatient only)  PPT#4:  17.41sec.  fastening/unfastening 3 buttons in 33.87sec improved ability to wash hair with LUE, 3 sentences with mild micrographia, transitional movements with min cueing    Functional Limitation  Self care    Self Care Goal Status ([id number])  At least 1 percent but less than 20 percent impaired, limited or restricted    Self Care Discharge Status [phone])  At least 1 percent but less than 20 percent impaired, limited or restricted       Problem List Patient Active Problem List   Diagnosis Date Noted  . Parkinson's disease ([place]) 11/26/2016  . Parkinsonism ([place]) 10/17/2016  . Fatigue 10/17/2016  . Long term (current) use of anticoagulants 05/29/2016  . Left wrist pain 01/03/2016  . Left hand pain 01/03/2016  . Vaginal atrophy 10/19/2015  . Tremor, unspecified 10/19/2015  . Osteopenia 10/19/2015  . 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 Closed fracture of part of upper end of humerus 05/01/2015  . Hematuria 02/04/2014  . 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 Cervical pain (neck) 06/20/2010  . Atrial fibrillation ([place]) 05/21/2010  . 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  Menopausal and postmenopausal disorder 02/16/2007  . DEGENERATIVE JOINT DISEASE, RIGHT HIP 02/16/2007     OCCUPATIONAL THERAPY DISCHARGE SUMMARY  Visits from Start of Care: 14  Current functional level related to goals / functional outcomes: See above   Remaining deficits: Bradykinesia, rigidity, decr coordination, decr functional mobility/balance for ADLs, decr posture   Education / Equipment: Pt was instructed in the following:  PD-specific HEP, adaptive strategies for ADLs/IADLs, ways to prevent future complications, appropriate community resources.  Pt verbalized understanding of all education provided.    Plan: Patient agrees to discharge.  Patient goals were  partially met. Patient is being discharged due to being pleased with the current functional level. Pt would benefit from occupational therapy screen in approx 6 months to assess for need for further therapy/functional changes due to progressive nature of diagnosis.  ?????        [facility] 12/18/2016, 12:23 PM  [facility] [address] [place], [place], [address] Phone: [phone]   Fax:  [phone]  Name: Stephanie Shaw MRN: [id number] Date of Birth: 06-12-1940   Vianne Bulls, OTR/L [facility] [address] [place], [place]  [address] [phone] phone [phone] 12/18/16 12:23 PM

## 2016-12-18 NOTE — Telephone Encounter (Signed)
Form completed and faxed. Patient made aware.

## 2016-12-22 ENCOUNTER — Encounter: Payer: Medicare Other | Admitting: Occupational Therapy

## 2016-12-25 ENCOUNTER — Encounter: Payer: Medicare Other | Admitting: Occupational Therapy

## 2017-01-05 MED FILL — XARELTO 20 MG TABLET: 20 | 90 days supply | Qty: 90 | Fill #2

## 2017-01-19 ENCOUNTER — Other Ambulatory Visit: Payer: Self-pay | Admitting: Neurology

## 2017-01-19 MED ORDER — CARBIDOPA-LEVODOPA 25-100 MG PO TABS: 1.0000 | ORAL_TABLET | Freq: Three times a day (TID) | ORAL | 0 refills | Status: DC

## 2017-02-16 ENCOUNTER — Ambulatory Visit: Payer: Medicare Other | Admitting: Family Medicine

## 2017-02-16 ENCOUNTER — Encounter: Payer: Self-pay | Admitting: Family Medicine

## 2017-02-16 VITALS — BP 110/60 | HR 79 | Temp 97.6°F | Resp 12 | Ht 68.0 in | Wt 139.4 lb

## 2017-02-16 DIAGNOSIS — R5383 Other fatigue: Secondary | ICD-10-CM

## 2017-02-16 DIAGNOSIS — H6123 Impacted cerumen, bilateral: Secondary | ICD-10-CM

## 2017-02-16 DIAGNOSIS — R2681 Unsteadiness on feet: Secondary | ICD-10-CM | POA: Diagnosis not present

## 2017-02-16 DIAGNOSIS — I481 Persistent atrial fibrillation: Secondary | ICD-10-CM | POA: Diagnosis not present

## 2017-02-16 DIAGNOSIS — I4819 Other persistent atrial fibrillation: Secondary | ICD-10-CM

## 2017-02-16 HISTORY — DX: Unsteadiness on feet: R26.81

## 2017-02-16 NOTE — Progress Notes (Signed)
HPI:   [name] is a 77 y.o. female, who is here today for 4 months follow up.   She was last seen on 10/17/16.  Since her last OV she has started PT and now on Carbidopa-Levodopa 25-100 mg tid for Parkinson's disease. She is reporting symptoms as stable: Fatigue and unstable gait. Fatigue is aggravated by some of her daily activities,including exercise. She is trying to exercise as much as she can,hoping this would help with coordination and balance.  She is also exercising regularly: Spinning classes and one group exercise class ("big movements") for Parkinson.  Dysphagia has resolved. She has not had constipation since she started drinking prune juice daily.   Atrial fib Appt with cardiologist in 05/2017.  She is concerned about falls and anticoagulation. She wonders if she needs to changes to Coumadin. She has fallen once since her last OV, unharmed.  She is currently on Xarelto and Diltiazem CD 180 mg daily.  Tolerating medication well.    Her daughter has suggested for her to move to independent living, she is considering it but does not like the idea of moving from her house.  Ear discomfort: Hx of hearing loss, she has hearing aids. For the past few days she feels like her hearing is worse, feels like she is "in a box", "stopped up ears."  She has not had recent URI or travel. No earache or drainage.  No fever,chills,or body aches.   Review of Systems  Constitutional: Positive for fatigue. Negative for activity change, appetite change and fever.  HENT: Positive for hearing loss. Negative for ear discharge, ear pain, mouth sores, nosebleeds and trouble swallowing.   Eyes: Negative for redness and visual disturbance.  Respiratory: Negative for cough, shortness of breath and wheezing.   Cardiovascular: Negative for chest pain, palpitations and leg swelling.  Gastrointestinal: Negative for abdominal pain, nausea and vomiting.  Genitourinary:  Negative for decreased urine volume, dysuria and hematuria.  Musculoskeletal: Positive for gait problem. Negative for myalgias.  Neurological: Negative for syncope, weakness and headaches.  Psychiatric/Behavioral: Negative for confusion. The patient is nervous/anxious.       Current Outpatient Medications on File Prior to Visit  Medication Sig Dispense Refill  . carbidopa-levodopa (SINEMET IR) 25-100 MG tablet Take 1 tablet by mouth 3 (three) times daily. 270 tablet 0  . cholecalciferol (VITAMIN D) 400 units TABS tablet Take 400 Units by mouth daily.    Marland Kitchen diltiazem (CARDIZEM CD) 180 MG 24 hr capsule TAKE ONE CAPSULE EVERY DAY 90 capsule 3  . estradiol (ESTRACE VAGINAL) 0.1 MG/GM vaginal cream INSERT 1/4 OF APPLICATORFUL VAGINALLY TWICE PER WEEK 42.5 g 3  . ferrous sulfate 325 (65 FE) MG EC tablet Take 325 mg by mouth 2 (two) times a week.     . Flaxseed, Linseed, (FLAX SEED OIL) 1000 MG CAPS Take 2 capsules by mouth 2 (two) times daily.     Alveda Reasons 20 MG TABS tablet TAKE 1 TABLET BY MOUTH ONCE DAILY 90 tablet 3   No current facility-administered medications on file prior to visit.      Past Medical History:  Diagnosis Date  . Colles' fracture of right radius 03/05/2015  . DEGENERATIVE JOINT DISEASE, RIGHT HIP 02/16/2007  . Dupuytren's contracture   . Osteoarthritis of hip    right  . Osteoarthritis of left knee   . POSTMENOPAUSAL SYNDROME 02/16/2007  . PREMATURE ATRIAL CONTRACTIONS 02/16/2007  . S/P breast biopsy, left    two  o'clock position - benign  . 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 Venous insufficiency    Allergies  Allergen Reactions  . Doxycycline Nausea And Vomiting  . Sulfonamide Derivatives     REACTION: HIVES    Social History   Socioeconomic History  . Marital status: Divorced    Spouse name: None  . Number of children: None  . Years of education: None  . Highest education level: None  Social Needs  . Financial resource strain: None  . Food  insecurity - worry: None  . Food insecurity - inability: None  . Transportation needs - medical: None  . Transportation needs - non-medical: None  Occupational History  . Occupation: retired    Comment: math, [employer]  Tobacco Use  . Smoking status: Former Smoker    Last attempt to quit: 08/27/1971    Years since quitting: 45.5  . Smokeless tobacco: Never Used  Substance and Sexual Activity  . Alcohol use: No    Alcohol/week: 0.0 oz    Comment: hardly ever  . Drug use: No  . Sexual activity: None  Other Topics Concern  . None  Social History Narrative  . None    Vitals:   02/16/17 0928  BP: 110/60  Pulse: 79  Resp: 12  Temp: 97.6 F (36.4 C)  SpO2: 98%   Body mass index is 21.2 kg/m.   Physical Exam  Nursing note and vitals reviewed. Constitutional: She is oriented to person, place, and time. She appears well-developed and well-nourished. No distress.  HENT:  Head: Normocephalic and atraumatic.  Right Ear: External ear normal. Decreased hearing is noted.  Left Ear: External ear normal. Decreased hearing is noted.  Mouth/Throat: Oropharynx is clear and moist and mucous membranes are normal.  Bilateral cerumen excess, not able to see TM's  Eyes: Conjunctivae and EOM are normal. Pupils are equal, round, and reactive to light.  Cardiovascular: Normal rate. An irregular rhythm present.  No murmur heard. Pulses:      Dorsalis pedis pulses are 2+ on the right side, and 2+ on the left side.  Respiratory: Effort normal and breath sounds normal. No respiratory distress.  GI: Soft. She exhibits no mass. There is no hepatomegaly. There is no tenderness.  Musculoskeletal: She exhibits no edema or tenderness.  Lymphadenopathy:    She has no cervical adenopathy.  Neurological: She is alert and oriented to person, place, and time. She has normal strength. Coordination normal.  Unstable gait, not assisted.  Skin: Skin is warm. No erythema.  Psychiatric: Her mood appears anxious.    Well groomed, good eye contact.    ASSESSMENT AND PLAN:   Ms. Stephanie Shaw was seen today for 4 months follow-up.   Stephanie Shaw was seen today for follow-up.  Diagnoses and all orders for this visit:  Unstable gait  Persistent atrial fibrillation (HCC)  Fatigue, unspecified type  Bilateral hearing loss due to cerumen impaction   Frustrated about fatigue, which she is reporting to be stable. We discussed possible etiologies, certainly her recent Dx of Parkinson's contributes to this problem but also meds side effects and intensity/level of daily activities. I recommend exercising every other day instead daily, so she can have a day in between to recover.  -Hearing loss aggravated by cerumen impaction, R>L. She tolerated ear lavage well. No major complications,minimal bleeding (oozing) from ear canal, bilateral. No wounds or lesions in ear canal appreciated. No active bleeding appreciated and no pain. Right ear discomfort resolved,hearing improved. Hearing left  side unchanged. TM's no erythema and normal bilateral.  Instructed about warning signs. F/U as needed.      -Ms. [name] was advised to return sooner than planned today if new concerns arise.       [name]. [name], MD  [facility]. [place] office.

## 2017-02-16 NOTE — Assessment & Plan Note (Signed)
Rate controlled. We discussed some side effects of anticoagulation in general.Explained that the risk of bleeding is similar on all,so I do not think she will benefits from changing to Coumadin at this time. I recommended continuing Xarelto,benefits discussed. No changes in Diltiazem. Fall prevention discussed. She is supposed to follow with Dr Aundra Dubin in 05/2017.

## 2017-02-16 NOTE — Assessment & Plan Note (Signed)
Fall precautions discussed. She may want to consider using a cane or Stephanie Shaw, she is not interested at this time. Continue PT exercises.

## 2017-02-16 NOTE — Patient Instructions (Addendum)
A few things to remember from today's visit:   Persistent atrial fibrillation (HCC)  Fatigue, unspecified type  Unstable gait  Bilateral hearing loss due to cerumen impaction  No changes today. I will see you back in 10/2017, before if needed.   Please be sure medication list is accurate. If a new problem present, please set up appointment sooner than planned today.

## 2017-02-27 IMAGING — CT CT HEAD W/O CM
2 series · 16 of 30 positions shown, 20 images · non-contrast
Comparison: 06/17/2010

CLINICAL DATA: Recent fall 1 week ago. Left-sided facial and leg
numbness beginning yesterday. Patient on blood thinners.

EXAM:
CT HEAD WITHOUT CONTRAST
TECHNIQUE: Contiguous axial images were obtained from the base of the skull
through the vertex without intravenous contrast.

[Series 201: head w/o, idose (1) · axial · non-contrast · 0.38mm/px · z∈[+110,+235]mm · 13 of 31 slices shown, 17 images]
[im 3/31  brain]
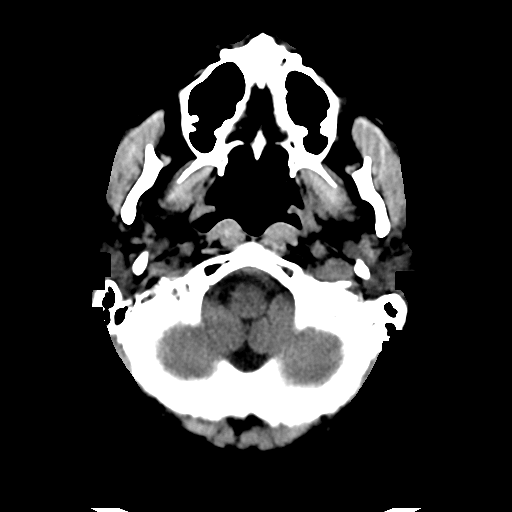
[im 3/31  bone]
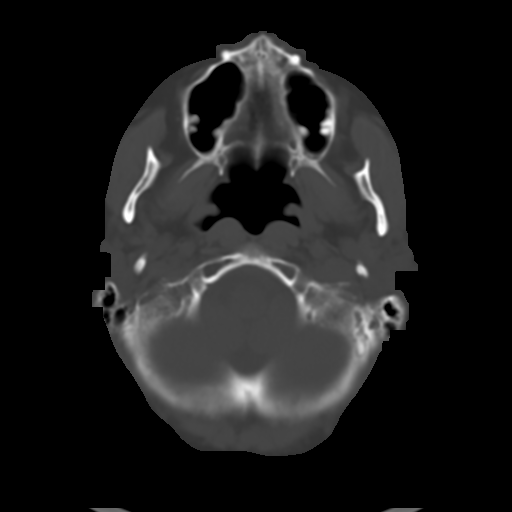
[im 5/31  brain]
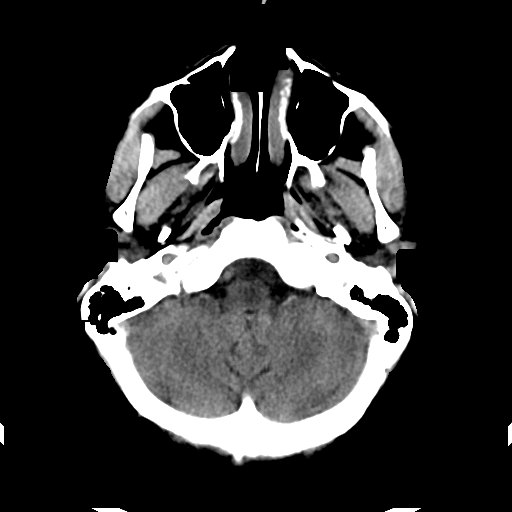
[im 7/31  brain]
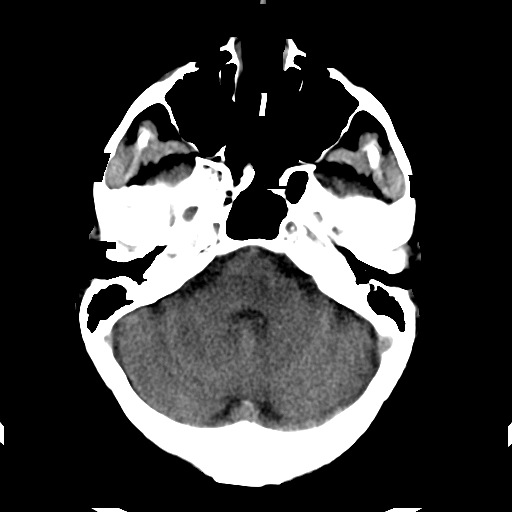
[im 9/31  brain]
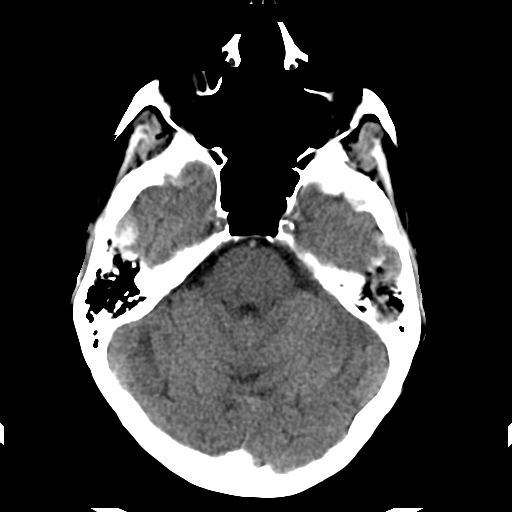
[im 11/31  brain]
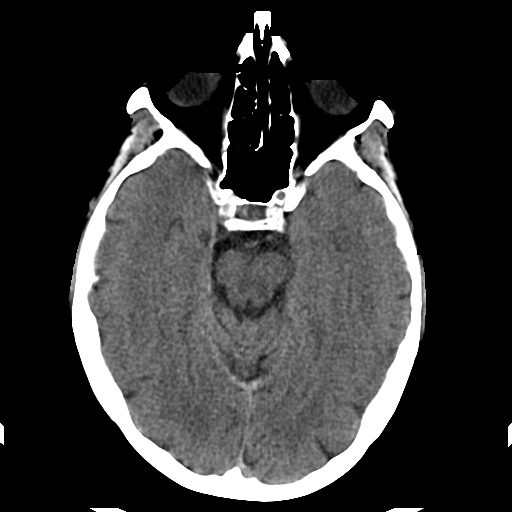
[im 11/31  bone]
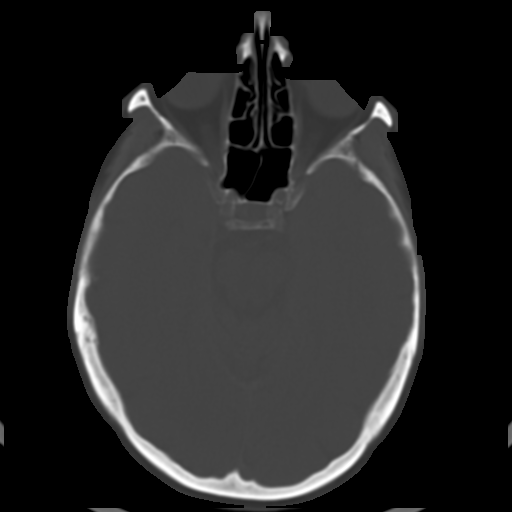
[im 13/31  brain]
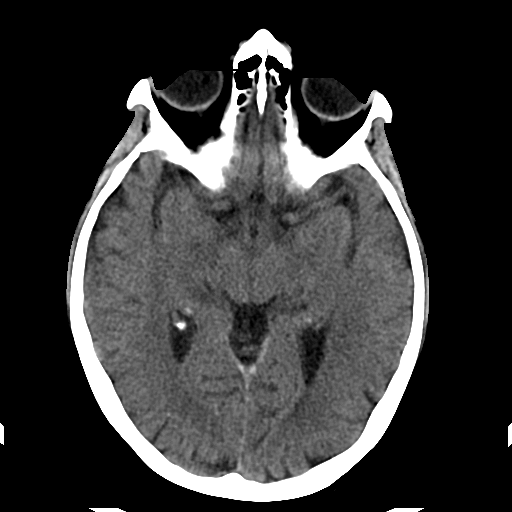
[im 16/31  brain]
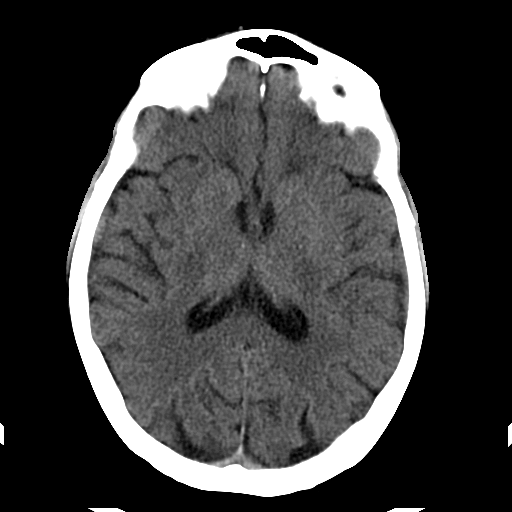
[im 18/31  brain]
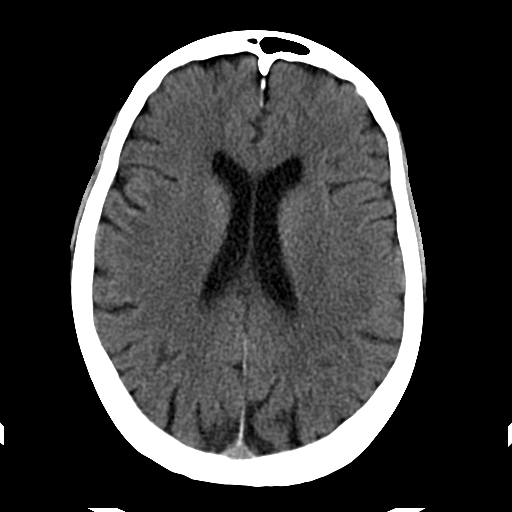
[im 20/31  brain]
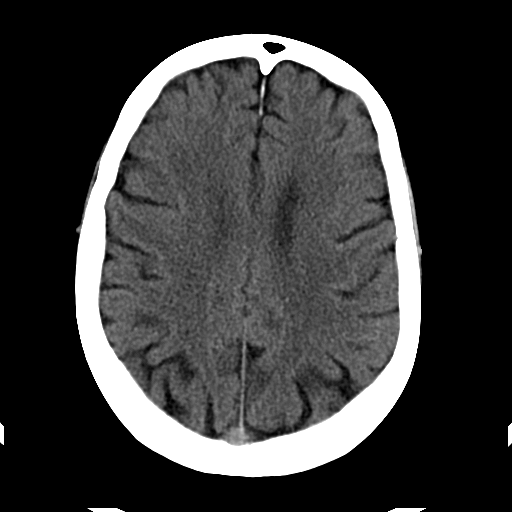
[im 20/31  bone]
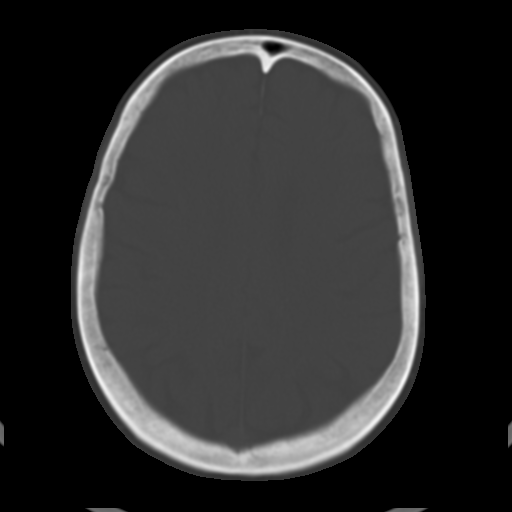
[im 22/31  brain]
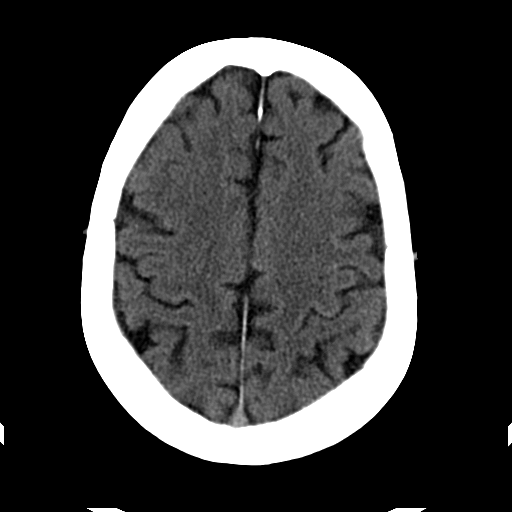
[im 24/31  brain]
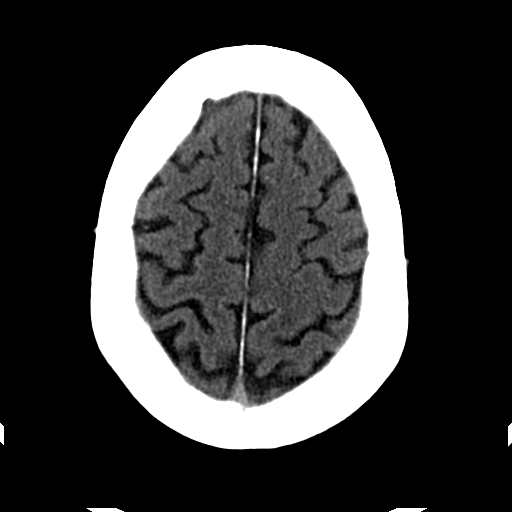
[im 26/31  brain]
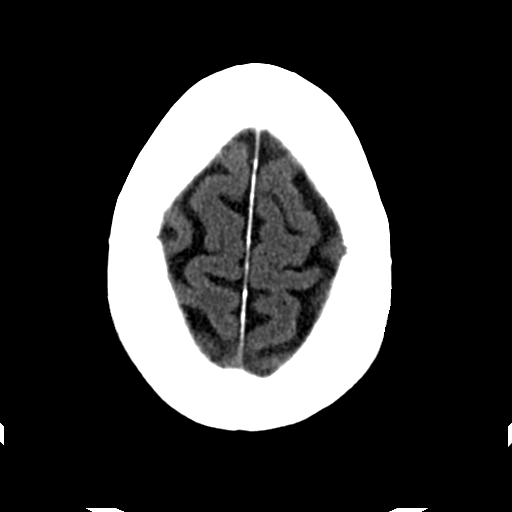
[im 28/31  brain]
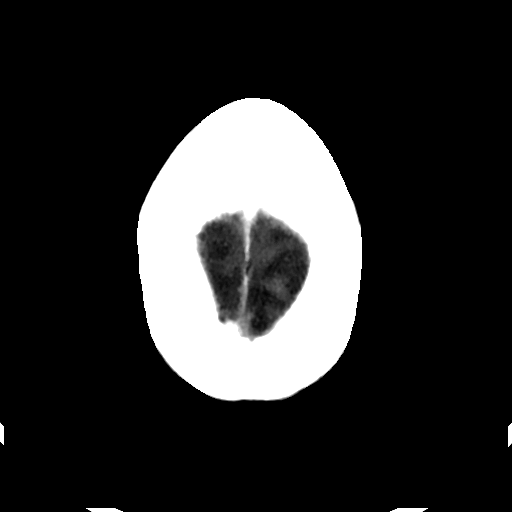
[im 28/31  bone]
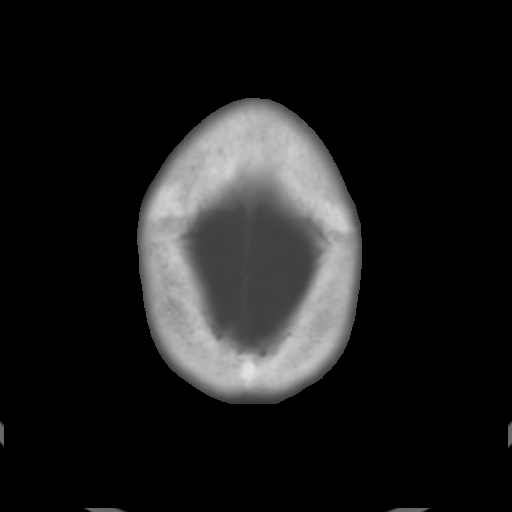

[Series 202: head w/o bone, idose (1) · axial · non-contrast · 0.38mm/px · z∈[+110,+150]mm · 3 of 31 slices shown]
[im 3/31  bone]
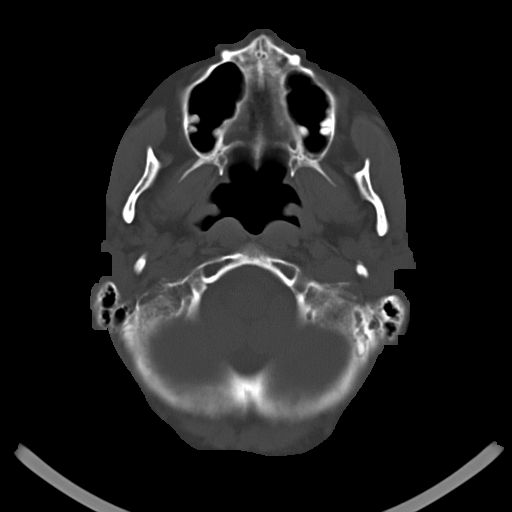
[im 7/31  bone]
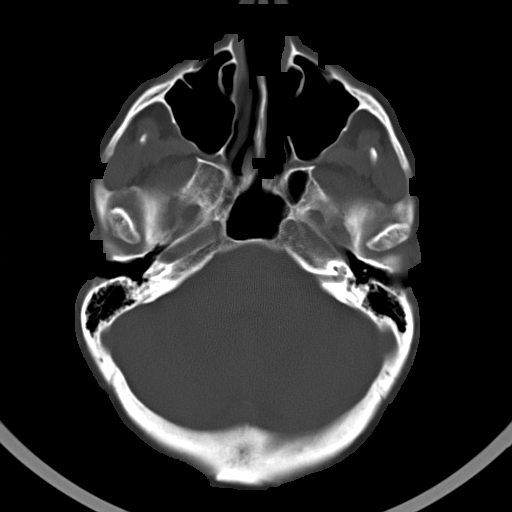
[im 11/31  bone]
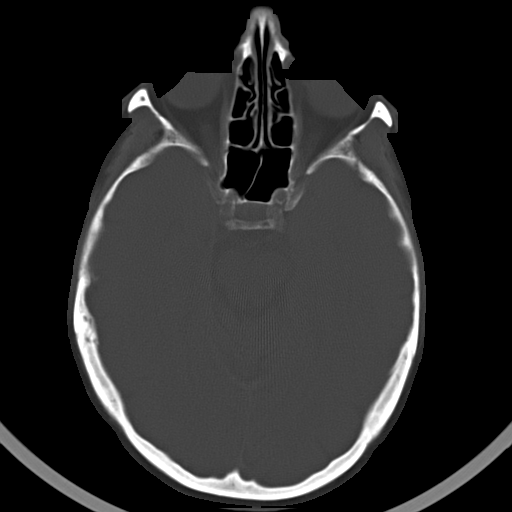

[16 of 30 positions shown; findings below may reference images not displayed]

FINDINGS: The ventricles are normal in size, for this patient's age, and
normal in configuration.

There are no parenchymal masses or mass effect, no evidence of an
infarct, no extra-axial masses or abnormal fluid collections and no
intracranial hemorrhage.

The sinuses and mastoid air cells are clear.
IMPRESSION: Normal unenhanced CT scan of the brain for age.

## 2017-03-04 NOTE — Progress Notes (Signed)
Stephanie Shaw was seen today in the movement disorders clinic for neurologic consultation at the request of [name], [name], MD.  The consultation is for the evaluation of tremor.  The records that were made available to me were reviewed.   Tremor: Yes.     How long has it been going on? 1-2 years  At rest or with activation?  With activation  Fam hx of tremor?  Yes.    Located where?  "all over" - jaw, voice, left leg, both arms  Affected by caffeine:  No. (1 cup coffee per day)  Affected by alcohol:  Doesn't drink enough to know  Affected by stress:  unsure  Affected by fatigue: unsure  Spills soup if on spoon:  Will be okay if holds spoon tight  Spills glass of liquid if full:  No.  Affects ADL's (tying shoes, brushing teeth, etc):  Yes.  , but just flossing teeth  Other Specific Symptoms:  Voice: noted vocal tremor Sleep: "I'm sleeping all day and all night"  Vivid Dreams:  No.  Acting out dreams:  No. Wet Pillows: occasionally Postural symptoms:  Yes.  , some  Falls?  Yes.  , 2 in the last year but "little falls" - the prior year had fx both arms Bradykinesia symptoms: difficulty getting out of a chair and difficulty regaining balance Loss of smell:  No. Loss of taste:  No. Urinary Incontinence:  No. Difficulty Swallowing:  No. (rarely has trouble) Handwriting, micrographia: Yes.   Trouble buttoning clothing: Yes.   Depression:  Yes.  , just mild because just retired and was troublesome for her Memory changes:  No. Hallucinations:  No.  visual distortions: No. N/V:  No. Lightheaded:  No.  Syncope: No. Diplopia:  No. Dyskinesia:  No.  Neuroimaging has previously been performed.  Patient had a CT of the brain in April, 2017 after a fall.  I reviewed this.  It was unremarkable.  11/26/16 update: Patient is seen in today for follow-up of testing results.  She is accompanied by her daughter who supplements the history.  She had a DaTscan in September, 2018.  I have  reviewed those results.  DaT scan officially showed Poor uptake of dopamine transport specific radiotracer within the LEFT and RIGHT putamen and decrease relative uptake in the head of the RIGHT caudate nucleus.  When I looked at the scan myself, there was mostly decreased uptake on the right.  MRI of the brain was done September 05, 2016.  I personally reviewed this.  It was normal.  She has been attending therapies at the rehab center.  She has started [employer].  The records that were made available to me were reviewed.  She did have a fall since last visit.  She fell in the grass.  Someone helped her get up and she didn't get hurt.    03/06/17 update: Patient is seen today in follow-up.  She was started on carbidopa/levodopa 25/100 last visit and worked 1 tablet 3 times per day.  She reports that she is a little more stable.  She attended rehab therapy since last visit.  The records that were made available to me were reviewed. Reports being involved with [employer] spin class and [employer] classes.  Pt denies falls.  Pt denies lightheadedness, near syncope.  No hallucinations.  Mood has been good and she denies depression.  Doesn't want medication.  "I stay busy."  "My daughter thinks I am going to  be like I was 20 years ago."  Going to [facility] next month to visit a friend. Restarted singing in choir.    PREVIOUS MEDICATIONS: none to date  ALLERGIES:   Allergies  Allergen Reactions  . Doxycycline Nausea And Vomiting  . Sulfonamide Derivatives     REACTION: HIVES    CURRENT MEDICATIONS:  Outpatient Encounter Medications as of 03/06/2017  Medication Sig  . carbidopa-levodopa (SINEMET IR) 25-100 MG tablet Take 1 tablet by mouth 3 (three) times daily.  . cholecalciferol (VITAMIN D) 400 units TABS tablet Take 400 Units by mouth daily.  Marland Kitchen diltiazem (CARDIZEM CD) 180 MG 24 hr capsule TAKE ONE CAPSULE EVERY DAY  . estradiol (ESTRACE VAGINAL) 0.1 MG/GM vaginal cream INSERT 1/4 OF APPLICATORFUL VAGINALLY  TWICE PER WEEK  . ferrous sulfate 325 (65 FE) MG EC tablet Take 325 mg by mouth 2 (two) times a week.   . Flaxseed, Linseed, (FLAX SEED OIL) 1000 MG CAPS Take 2 capsules by mouth 2 (two) times daily.   Alveda Reasons 20 MG TABS tablet TAKE 1 TABLET BY MOUTH ONCE DAILY   No facility-administered encounter medications on file as of 03/06/2017.     PAST MEDICAL HISTORY:   Past Medical History:  Diagnosis Date  . Colles' fracture of right radius 03/05/2015  . DEGENERATIVE JOINT DISEASE, RIGHT HIP 02/16/2007  . Dupuytren's contracture   . Osteoarthritis of hip    right  . Osteoarthritis of left knee   . POSTMENOPAUSAL SYNDROME 02/16/2007  . PREMATURE ATRIAL CONTRACTIONS 02/16/2007  . S/P breast biopsy, left    two o'clock position - benign  . 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 Venous insufficiency     PAST SURGICAL HISTORY:   Past Surgical History:  Procedure Laterality Date  . BREAST BIOPSY Left   . CATARACT EXTRACTION, BILATERAL      SOCIAL HISTORY:   Social History   Socioeconomic History  . Marital status: Divorced    Spouse name: Not on file  . Number of children: Not on file  . Years of education: Not on file  . Highest education level: Not on file  Social Needs  . Financial resource strain: Not on file  . Food insecurity - worry: Not on file  . Food insecurity - inability: Not on file  . Transportation needs - medical: Not on file  . Transportation needs - non-medical: Not on file  Occupational History  . Occupation: retired    Comment: math, [employer]  Tobacco Use  . Smoking status: Former Smoker    Last attempt to quit: 08/27/1971    Years since quitting: 45.5  . Smokeless tobacco: Never Used  Substance and Sexual Activity  . Alcohol use: No    Alcohol/week: 0.0 oz    Comment: hardly ever  . Drug use: No  . Sexual activity: Not on file  Other Topics Concern  . Not on file  Social History Narrative  . Not on file    FAMILY HISTORY:   Family Status    Relation Name Status  . Father  Deceased  . Mother  Deceased  . Sister  Alive  . Sister  Alive  . Child 2 Alive  . Neg Hx  (Not Specified)    ROS:   A complete 10 system review of systems was obtained and was unremarkable apart from what is mentioned above.  PHYSICAL EXAMINATION:    VITALS:   Vitals:   03/06/17 1016  BP: (!) 86/74  Pulse: 85  SpO2: 96%  Weight: 136 lb (61.7 kg)  Height: 5\' 8"  (1.727 m)    GEN:  The patient appears stated age and is in NAD. Jovial.  Smiling today HEENT:  Normocephalic, atraumatic.  The mucous membranes are moist. The superficial temporal arteries are without ropiness or tenderness. CV:  RRR Lungs:  CTAB Neck/HEME:  There are no carotid bruits bilaterally.  Neurological examination:  Orientation: The patient is alert and oriented x3.  Cranial nerves: There is good facial symmetry. There is facial hypomimia with lips parted some.   The visual fields are full to confrontational testing. The speech is fluent and clear. She is hypophonic.  Soft palate rises symmetrically and there is no tongue deviation. Hearing is intact to conversational tone. Sensation: Sensation is intact to light touch throughout.   Motor: Strength is at least antigravity x 4  Movement examination: Tone: There is normal tone bilaterally Abnormal movements: There is no tremor even with distraction procedures.  No tremor of outstretched hands or with intention.   Coordination:  There is decremation with RAM's, with any form of RAMS, including alternating supination and pronation of the forearm, hand opening and closing, finger taps, heel taps and toe taps on the L (same as previous).  RAMs on the right are good Gait and Station: The patient has some difficulty arising out of a deep-seated chair without the use of the hands but she is able to do it on the second attempt.  She is no longer walking on the toes.  She has decreased arm swing bilaterally but stride length is  good  Labs:  Lab Results  Component Value Date   VITAMINB12 1,238 (H) 08/19/2016     Chemistry      Component Value Date/Time   NA 138 08/19/2016 1029   NA 139 05/29/2016 1043   K 4.3 08/19/2016 1029   CL 101 08/19/2016 1029   CO2 30 08/19/2016 1029   BUN 14 08/19/2016 1029   BUN 22 05/29/2016 1043   CREATININE 0.79 08/19/2016 1029   CREATININE 0.92 06/14/2015 0853      Component Value Date/Time   CALCIUM 9.6 08/19/2016 1029   ALKPHOS 46 08/19/2016 1029   AST 21 08/19/2016 1029   ALT 15 08/19/2016 1029   BILITOT 0.6 08/19/2016 1029     Lab Results  Component Value Date   TSH 1.00 08/19/2016      ASSESSMENT/PLAN:  1.  Early idiopathic Parkinson's disease, Hoehn and Yoehr stage I  -DaTscan was positive in September, 2018  -continue carbidopa/levodopa 25/100 tid.  Risks, benefits, side effects and alternative therapies were discussed.  The opportunity to ask questions was given and they were answered to the best of my ability.  The patient expressed understanding and willingness to follow the outlined treatment protocols.  2.  Mild anxiety and depression  -she denies any depression.  Daughter, last visit, wanted her on antidepressant but she denies depression, states that she is staying busy and is happy.  Will continue to monitor  3.  Low blood pressure  -pts BP very low.  On 180 mg of cardizem.  States that it is for HR control and she has been having PCP prescribe.  However, she states that cardiology was original prescriber and doesn't feel comfortable changing without cardiology approval.  Will make her f/u with Dr. Acie Fredrickson.  Pt is asymptommatic  4.  Follow up is anticipated in the next 4-5 months, sooner should new neurologic issues arise. Much greater than  50% of this visit was spent in counseling and coordinating care.  Total face to face time:  25 min     Cc:  [name], [name], MD

## 2017-03-06 ENCOUNTER — Ambulatory Visit: Payer: Medicare Other | Admitting: Neurology

## 2017-03-06 ENCOUNTER — Encounter: Payer: Self-pay | Admitting: Neurology

## 2017-03-06 VITALS — BP 86/74 | HR 85 | Ht 68.0 in | Wt 136.0 lb

## 2017-03-06 DIAGNOSIS — G20 Parkinson's disease: Secondary | ICD-10-CM

## 2017-03-06 NOTE — Patient Instructions (Signed)
1. Appt made with Dr. Acie Fredrickson for 06/02/17 at 3:40 pm. They are located at [address]. If this is not a good date/time they can be reached at [phone] to reschedule.

## 2017-03-13 ENCOUNTER — Ambulatory Visit: Payer: Medicare Other | Admitting: Neurology

## 2017-03-19 ENCOUNTER — Encounter: Payer: Self-pay | Admitting: Family Medicine

## 2017-03-19 ENCOUNTER — Other Ambulatory Visit: Payer: Self-pay | Admitting: Family Medicine

## 2017-03-19 ENCOUNTER — Ambulatory Visit: Payer: Medicare Other | Admitting: Family Medicine

## 2017-03-19 VITALS — BP 90/68 | HR 88 | Temp 98.2°F | Ht 68.0 in | Wt 137.4 lb

## 2017-03-19 DIAGNOSIS — R300 Dysuria: Secondary | ICD-10-CM

## 2017-03-19 LAB — POCT URINALYSIS DIPSTICK
BILIRUBIN UA: NEGATIVE
Blood, UA: NEGATIVE
GLUCOSE UA: NEGATIVE
Ketones, UA: NEGATIVE
Nitrite, UA: NEGATIVE
PH UA: 6.5 (ref 5.0–8.0)
Protein, UA: NEGATIVE
Spec Grav, UA: 1.015 (ref 1.010–1.025)
Urobilinogen, UA: 0.2 E.U./dL

## 2017-03-19 NOTE — Progress Notes (Signed)
HPI:  Acute visit for dysuria: -Has been ongoing for several days -Symptoms include urinary frequency and dysuria -Denies fevers, malaise, flank pain, heme hematuria, vaginal symptoms, abdominal or pelvic pain -She has some symptoms with her Parkinson's, but she is headed to [place] and this seems different and she is worried about a UTI, she has a history of UTI  ROS: See pertinent positives and negatives per HPI.  Past Medical History:  Diagnosis Date  . Colles' fracture of right radius 03/05/2015  . DEGENERATIVE JOINT DISEASE, RIGHT HIP 02/16/2007  . Dupuytren's contracture   . Osteoarthritis of hip    right  . Osteoarthritis of left knee   . POSTMENOPAUSAL SYNDROME 02/16/2007  . PREMATURE ATRIAL CONTRACTIONS 02/16/2007  . S/P breast biopsy, left    two o'clock position - benign  . 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 Venous insufficiency     Past Surgical History:  Procedure Laterality Date  . BREAST BIOPSY Left   . CATARACT EXTRACTION, BILATERAL      Family History  Problem Relation Age of Onset  . Cancer Father   . Alcohol abuse Father   . Cancer Mother   . Diabetes Mother   . Heart failure Mother   . Osteoarthritis Sister   . Breast cancer Neg Hx     Social History   Socioeconomic History  . Marital status: Divorced    Spouse name: None  . Number of children: None  . Years of education: None  . Highest education level: None  Social Needs  . Financial resource strain: None  . Food insecurity - worry: None  . Food insecurity - inability: None  . Transportation needs - medical: None  . Transportation needs - non-medical: None  Occupational History  . Occupation: retired    Comment: math, [employer]  Tobacco Use  . Smoking status: Former Smoker    Last attempt to quit: 08/27/1971    Years since quitting: 45.5  . Smokeless tobacco: Never Used  Substance and Sexual Activity  . Alcohol use: No    Alcohol/week: 0.0 oz    Comment: hardly ever  . Drug use:  No  . Sexual activity: None  Other Topics Concern  . None  Social History Narrative  . None     Current Outpatient Medications:  .  carbidopa-levodopa (SINEMET IR) 25-100 MG tablet, Take 1 tablet by mouth 3 (three) times daily., Disp: 270 tablet, Rfl: 0 .  cholecalciferol (VITAMIN D) 400 units TABS tablet, Take 400 Units by mouth daily., Disp: , Rfl:  .  diltiazem (CARDIZEM CD) 180 MG 24 hr capsule, TAKE ONE CAPSULE EVERY DAY, Disp: 90 capsule, Rfl: 3 .  estradiol (ESTRACE VAGINAL) 0.1 MG/GM vaginal cream, INSERT 1/4 OF APPLICATORFUL VAGINALLY TWICE PER WEEK, Disp: 42.5 g, Rfl: 3 .  ferrous sulfate 325 (65 FE) MG EC tablet, Take 325 mg by mouth 2 (two) times a week. , Disp: , Rfl:  .  Flaxseed, Linseed, (FLAX SEED OIL) 1000 MG CAPS, Take 2 capsules by mouth 2 (two) times daily. , Disp: , Rfl:  .  XARELTO 20 MG TABS tablet, TAKE 1 TABLET BY MOUTH ONCE DAILY, Disp: 90 tablet, Rfl: 3  EXAM:  Vitals:   03/19/17 1531  BP: 90/68  Pulse: 88  Temp: 98.2 F (36.8 C)    Body mass index is 20.89 kg/m.  GENERAL: vitals reviewed and listed above, alert, oriented, appears well hydrated and in no acute distress  HEENT: atraumatic, conjunttiva clear, no  obvious abnormalities on inspection of external nose and ears  NECK: no obvious masses on inspection  LUNGS: clear to auscultation bilaterally, no wheezes, rales or rhonchi, good air movement  CV: HRRR, no peripheral edema  ABD: NTTP, no CVA TTP  PSYCH: pleasant and cooperative, no obvious depression or anxiety  ASSESSMENT AND PLAN:  Discussed the following assessment and plan:  Dysuria - Plan: POC Urinalysis Dipstick, Culture, Urine  -udip and culture if needed, treat according -plenty of water -Patient advised to return or notify a doctor immediately if symptoms worsen, new concerns arise or symptoms persist longer than expected.  Declined AVS.  There are no Patient Instructions on file for this visit.  Lucretia Kern,  DO

## 2017-03-20 ENCOUNTER — Other Ambulatory Visit: Payer: Self-pay | Admitting: Family Medicine

## 2017-03-22 LAB — URINE CULTURE
MICRO NUMBER: 90262693
SPECIMEN QUALITY:: ADEQUATE

## 2017-03-23 ENCOUNTER — Other Ambulatory Visit: Payer: Self-pay | Admitting: Family Medicine

## 2017-03-23 MED ORDER — NITROFURANTOIN MONOHYD MACRO 100 MG PO CAPS
100.0000 mg | ORAL_CAPSULE | Freq: Two times a day (BID) | ORAL | 0 refills | Status: DC
Start: 2017-03-23 — End: 2017-04-27

## 2017-03-23 NOTE — Addendum Note (Signed)
Addended by: Agnes Lawrence on: 03/23/2017 08:29 AM   Modules accepted: Orders

## 2017-04-14 MED FILL — XARELTO 20 MG TABLET: 20 | 90 days supply | Qty: 90 | Fill #3

## 2017-04-21 ENCOUNTER — Ambulatory Visit: Payer: Medicare Other | Admitting: Family Medicine

## 2017-04-21 ENCOUNTER — Encounter: Payer: Self-pay | Admitting: Family Medicine

## 2017-04-21 VITALS — BP 110/70 | HR 68 | Temp 97.7°F | Resp 12 | Ht 68.0 in | Wt 138.4 lb

## 2017-04-21 DIAGNOSIS — M25511 Pain in right shoulder: Secondary | ICD-10-CM

## 2017-04-21 MED ORDER — DICLOFENAC SODIUM 1 % TD GEL: 4.0000 g | Freq: Four times a day (QID) | TRANSDERMAL | 3 refills | Status: DC

## 2017-04-21 NOTE — Patient Instructions (Addendum)
A few things to remember from today's visit:   Acute pain of right shoulder - Plan: diclofenac sodium (VOLTAREN) 1 % GEL, Ambulatory referral to Sports Medicine  ? Biceps tendinitis. Tylenol 650 mg 3 times per day and local ice may also help.   Please be sure medication list is accurate. If a new problem present, please set up appointment sooner than planned today.

## 2017-04-21 NOTE — Progress Notes (Signed)
ACUTE VISIT   HPI:  Chief Complaint  Patient presents with  . Right Shoulder Pain    started a couple of weeks ago, getting worse    Stephanie Shaw is a 77 y.o. female, who is here today complaining of right shoulder pain that started a month ago and getting worse. No Hx of trauma but she feels like she has been related to RUE overuse. S Hx of parkinson's disease. She is exercising a few times per week , upper extremities, to help with coordination. Movement exacerbated pain, alleviated by rest. Pain is moderate,no radiated,and no associated with numbness or tingling.  Right handed.  Pain is exacerbated by certain activities. She has not noted fever,chills,rash,edema or erythema of joint. She has had problems with left shoulder after fall and humeral fracture,completed PT.   She has not tried OTC treatments.  Review of Systems  Constitutional: Positive for fatigue. Negative for appetite change, chills and fever.  Respiratory: Negative for shortness of breath and wheezing.   Gastrointestinal: Negative for abdominal pain, nausea and vomiting.  Musculoskeletal: Positive for arthralgias. Negative for joint swelling and neck pain.  Skin: Negative for pallor and rash.  Neurological: Negative for numbness.  Psychiatric/Behavioral: Negative for confusion.      Current Outpatient Medications on File Prior to Visit  Medication Sig Dispense Refill  . carbidopa-levodopa (SINEMET IR) 25-100 MG tablet Take 1 tablet by mouth 3 (three) times daily. 270 tablet 0  . cholecalciferol (VITAMIN D) 400 units TABS tablet Take 400 Units by mouth daily.    Marland Kitchen diltiazem (CARDIZEM CD) 180 MG 24 hr capsule TAKE ONE CAPSULE EVERY DAY 90 capsule 3  . estradiol (ESTRACE VAGINAL) 0.1 MG/GM vaginal cream INSERT 1/4 OF APPLICATORFUL VAGINALLY TWICE PER WEEK 42.5 g 3  . ferrous sulfate 325 (65 FE) MG EC tablet Take 325 mg by mouth 2 (two) times a week.     . Flaxseed, Linseed, (FLAX SEED OIL)  1000 MG CAPS Take 2 capsules by mouth 2 (two) times daily.     Alveda Reasons 20 MG TABS tablet TAKE 1 TABLET BY MOUTH ONCE DAILY 90 tablet 3  . nitrofurantoin, macrocrystal-monohydrate, (MACROBID) 100 MG capsule Take 1 capsule (100 mg total) by mouth 2 (two) times daily. (Patient not taking: Reported on 04/21/2017) 14 capsule 0   No current facility-administered medications on file prior to visit.      Past Medical History:  Diagnosis Date  . Colles' fracture of right radius 03/05/2015  . DEGENERATIVE JOINT DISEASE, RIGHT HIP 02/16/2007  . Dupuytren's contracture   . Osteoarthritis of hip    right  . Osteoarthritis of left knee   . POSTMENOPAUSAL SYNDROME 02/16/2007  . PREMATURE ATRIAL CONTRACTIONS 02/16/2007  . S/P breast biopsy, left    two o'clock position - benign  . 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 Venous insufficiency    Allergies  Allergen Reactions  . Doxycycline Nausea And Vomiting  . Sulfonamide Derivatives     REACTION: HIVES    Social History   Socioeconomic History  . Marital status: Divorced    Spouse name: Not on file  . Number of children: Not on file  . Years of education: Not on file  . Highest education level: Not on file  Occupational History  . Occupation: retired    Comment: math, [employer]  Social Needs  . Financial resource strain: Not on file  . Food insecurity:    Worry: Not on file  Inability: Not on file  . Transportation needs:    Medical: Not on file    Non-medical: Not on file  Tobacco Use  . Smoking status: Former Smoker    Last attempt to quit: 08/27/1971    Years since quitting: 45.6  . Smokeless tobacco: Never Used  Substance and Sexual Activity  . Alcohol use: No    Alcohol/week: 0.0 oz    Comment: hardly ever  . Drug use: No  . Sexual activity: Not on file  Lifestyle  . Physical activity:    Days per week: Not on file    Minutes per session: Not on file  . Stress: Not on file  Relationships  . Social connections:     Talks on phone: Not on file    Gets together: Not on file    Attends religious service: Not on file    Active member of club or organization: Not on file    Attends meetings of clubs or organizations: Not on file    Relationship status: Not on file  Other Topics Concern  . Not on file  Social History Narrative  . Not on file    Vitals:   04/21/17 1034  BP: 110/70  Pulse: 68  Resp: 12  Temp: 97.7 F (36.5 C)  SpO2: 100%   Body mass index is 21.04 kg/m.     Physical Exam  Nursing note and vitals reviewed. Constitutional: She appears well-developed and well-nourished.  HENT:  Head: Normocephalic and atraumatic.  Eyes: Conjunctivae are normal.  Cardiovascular: Normal rate and regular rhythm.  Respiratory: Effort normal and breath sounds normal.  Musculoskeletal: She exhibits tenderness. She exhibits no edema.  Shoulder: No deformity, edema, or erythema appreciated. Luan Pulling' test neg, drop arm rotator cuff test neg, empty can supraspinatus test neg, cross body adduction test neg, lift-Off Subscapularis test elicits pain. Pain elicited with palpation of anterior shoulder around bicipital tendon.  ROM with no significant limitation.  Lymphadenopathy:    She has no cervical adenopathy.  Neurological:  No focal deficit appreciated.  Skin: Skin is warm. No rash noted. No erythema.  Psychiatric: She has a normal mood and affect.  Well groomed,good eye contact.     ASSESSMENT AND PLAN:  [name] was seen today for right shoulder pain.  Diagnoses and all orders for this visit:  Acute pain of right shoulder  Possible causes discussed. Rotator cuff maneuvers neg overall. ? Biceps tendinitis,OA among some to consider. Topical NSAID's,Volaren recommended. Tylenol OTC 650 mg tid and local ice may also help.  Referral to sport med placed.   -     diclofenac sodium (VOLTAREN) 1 % GEL; Apply 4 g topically 4 (four) times daily. -     Ambulatory referral to Sports  Medicine      -StephanieJenell Milliner was advised to seek immediate medical attention if sudden worsening symptoms.    Ivie Savitt G. [name], MD  [facility]. [place] office.

## 2017-04-24 ENCOUNTER — Telehealth: Payer: Self-pay | Admitting: *Deleted

## 2017-04-24 ENCOUNTER — Ambulatory Visit: Payer: Medicare Other | Admitting: Sports Medicine

## 2017-04-24 NOTE — Telephone Encounter (Signed)
Prior auth for Diclofenac sod 1% gel sent to [email or web address].

## 2017-04-27 ENCOUNTER — Ambulatory Visit: Payer: Medicare Other | Admitting: Sports Medicine

## 2017-04-27 ENCOUNTER — Ambulatory Visit: Payer: Self-pay

## 2017-04-27 ENCOUNTER — Encounter: Payer: Self-pay | Admitting: Sports Medicine

## 2017-04-27 VITALS — BP 98/62 | HR 80 | Ht 68.0 in | Wt 137.4 lb

## 2017-04-27 DIAGNOSIS — G20 Parkinson's disease: Secondary | ICD-10-CM | POA: Diagnosis not present

## 2017-04-27 DIAGNOSIS — M19011 Primary osteoarthritis, right shoulder: Secondary | ICD-10-CM

## 2017-04-27 DIAGNOSIS — M25511 Pain in right shoulder: Secondary | ICD-10-CM | POA: Diagnosis not present

## 2017-04-27 HISTORY — DX: Primary osteoarthritis, right shoulder: M19.011

## 2017-04-27 NOTE — Procedures (Signed)
PROCEDURE NOTE:  Ultrasound Guided: Injection: Right shoulder Images were obtained and interpreted by myself, Teresa Coombs, DO  Images have been saved and stored to PACS system. Images obtained on: GE S7 Ultrasound machine    ULTRASOUND FINDINGS:  Small amount of spurring + halo sign of biceps tendon  DESCRIPTION OF PROCEDURE:  The patient's clinical condition is marked by substantial pain and/or significant functional disability. Other conservative therapy has not provided relief, is contraindicated, or not appropriate. There is a reasonable likelihood that injection will significantly improve the patient's pain and/or functional impairment.   After discussing the risks, benefits and expected outcomes of the injection and all questions were reviewed and answered, the patient wished to undergo the above named procedure.  Verbal consent was obtained.  The ultrasound was used to identify the target structure and adjacent neurovascular structures. The skin was then prepped in sterile fashion and the target structure was injected under direct visualization using sterile technique as below:  PREP: Alcohol and Ethel Chloride APPROACH: posterior, single injection, 25g 1.5 in. INJECTATE: 2 cc 0.5% Marcaine and 2 cc 40mg /mL DepoMedrol ASPIRATE: None DRESSING: Band-Aid  Post procedural instructions including recommending icing and warning signs for infection were reviewed.    This procedure was well tolerated and there were no complications.   IMPRESSION: Succesful Ultrasound Guided: Injection

## 2017-04-27 NOTE — Progress Notes (Signed)
[name], [place] [facility] [place]  Stephanie Shaw - 77 y.o. female MRN [id number]  Date of birth: 04-25-1940  Visit Date: 04/27/2017  PCP: [name], [name], MD   Referred by: [name], [name], MD  Scribe for today's visit: Josepha Pigg, CMA     SUBJECTIVE:  Stephanie Shaw is here for New Patient (Initial Visit) (R shoulder pain)  Her R shoulder pain symptoms INITIALLY: Began a few weeks ago and there is no known injury or trauma. Pt has parkinson's disease and has been doing exercises which she feels may be contributing to her pain. She is R hand dominant.  Described as moderate sharp stabbing, nonradiating Worsened with holding arm down and back. She has sharp pain when she is lying on the floor and raises her arm up. She also has increased pain when lifting body weight with her arms.  Improved with rest.  Additional associated symptoms include: She hears cracking in the joint when she moves the arm backward. She denies swelling around the shoulder joint.     At this time symptoms show no change compared to onset  She was prescribed Voltaren but was unable to use it because insurance wouldn't cover it. She has tried Tylenol but doesn't like to take this d/t the other medications that she is on.   ROS Denies night time disturbances. Denies fevers, chills, or night sweats. Denies unexplained weight loss. Denies personal history of cancer. Denies changes in bowel or bladder habits. Denies recent unreported falls. Reports new or worsening dyspnea or wheezing. Denies headaches or dizziness.  Denies numbness, tingling or weakness  In the extremities.  Denies dizziness or presyncopal episodes Denies lower extremity edema    HISTORY & PERTINENT PRIOR DATA:  Prior History reviewed and updated per electronic medical record.  Significant/pertinent history, findings, studies include:  reports that she quit smoking about  45 years ago. She has never used smokeless tobacco. No results for input(s): HGBA1C, LABURIC, CREATINE in the last 8760 hours. No specialty comments available. No problems updated.  OBJECTIVE:  VS:  HT:5\' 8"  (172.7 cm)   WT:137 lb 6.4 oz (62.3 kg)  BMI:20.9    BP:98/62  HR:80bpm  TEMP: ( )  RESP:97 %   PHYSICAL EXAM: Constitutional: WDWN, Non-toxic appearing. Psychiatric: Alert & appropriately interactive.  Not depressed or anxious appearing. Respiratory: No increased work of breathing.  Trachea Midline Eyes: Pupils are equal.  EOM intact without nystagmus.  No scleral icterus  Vascular Exam: warm to touch no edema  upper extremity neuro exam: unremarkable  MSK Exam: Right shoulder generalized tenderness to palpation.  He has pain with axial load and circumduction.  There is rigidity and cogwheeling.   ASSESSMENT & PLAN:   1. Right shoulder pain, unspecified chronicity   2. Acute pain of right shoulder   3. Parkinsonism, unspecified Parkinsonism type ([place])   4. Primary osteoarthritis of right shoulder     PLAN: Intra-articular injection performed today.  Follow-up in 6 weeks.  Discussed avoidance of exacerbating activities.  Follow-up: Return in about 6 weeks (around 06/08/2017).     Please see additional documentation for Objective, Assessment and Plan sections. Pertinent additional documentation may be included in corresponding procedure notes, imaging studies, problem based documentation and patient instructions. Please see these sections of the encounter for additional information regarding this visit.  CMA/ATC served as Education administrator during this visit. History, Physical, and Plan performed by medical provider. Documentation  and orders reviewed and attested to.      Gerda Diss, [facility] Physician

## 2017-04-27 NOTE — Patient Instructions (Signed)

## 2017-05-08 ENCOUNTER — Telehealth: Payer: Self-pay | Admitting: Family Medicine

## 2017-05-08 ENCOUNTER — Ambulatory Visit: Payer: Self-pay | Admitting: *Deleted

## 2017-05-08 NOTE — Telephone Encounter (Signed)
Pt  Has  Nosebleed  For  About 1  Hour  She is  Applying  Direct  Pressure . She  Is  On blood  Thinner  For  Atrial  Fib.  Office  Is  Closed  Today  Pt  Advised  To  Go to er  And  Was  Advised  Not to  Drive     Reason for Disposition . [1] Bleeding present > 30 minutes AND [2] using correct method of direct pressure  Answer Assessment - Initial Assessment Questions 1. AMOUNT OF BLEEDING: "How bad is the bleeding?" "How much blood was lost?" "Has the bleeding stopped?"   - MILD: needed a couple tissues   - MODERATE: needed many tissues   - SEVERE: large blood clots, soaked many tissues, lasted more than 30 minutes         MODERATE 2. ONSET: "When did the nosebleed start?"        APPROX  1  HOUR  AGO 3. FREQUENCY: "How many nosebleeds have you had in the last 24 hours?"         ONCE   4. RECURRENT SYMPTOMS: "Have there been other recent nosebleeds?" If so, ask: "How long did it take you to stop the bleeding?" "What worked best?"         NO 5. CAUSE: "What do you think caused this nosebleed?"         BLOWING   NOSE  THIS  AM   6. LOCAL FACTORS: "Do you have any cold symptoms?", "Have you been rubbing or picking at your nose?"          nO 7. SYSTEMIC FACTORS: "Do you have high blood pressure or any bleeding problems?"        BLOOD  THINNER   8. BLOOD THINNERS: "Do you take any blood thinners?" (e.g., coumadin, heparin, aspirin, Plavix)     YES 9. OTHER SYMPTOMS: "Do you have any other symptoms?" (e.g., lightheadedness)      NERVOUS   10. PREGNANCY: "Is there any chance you are pregnant?" "When was your last menstrual period?"        N/A  Protocols used: [id number]

## 2017-05-08 NOTE — Telephone Encounter (Signed)
Patient states she did get her nose to stop bleeding - it took her about 1 1/2- but she has not had any other problems. She states does not have congestion and is not using nasal sprays. Patient wants to know if she needs to continue her Xarelto. She also wants to know if she possibly needs blood work. Told patient to continue her medication. I will forward her question about labs to her provider to review on Monday. Please call back with ant other problems. Patient states she will.

## 2017-05-11 NOTE — Telephone Encounter (Signed)
As she knows sure it will increase the risk of bleeding. Because of her history of atrial fibrillation I recommend continuing medication for now. She could also discuss this with her cardiologist during her next office visit.   We could arrange lab appt to do a CBC to check platelets.  Thanks, BJ

## 2017-05-11 NOTE — Telephone Encounter (Signed)
Message sent to Dr. Jordan for review. 

## 2017-05-12 NOTE — Telephone Encounter (Signed)
Spoke with patient and she stated that she got the bleeding to stop after a hour and a half. Gave patient instructions per Dr. [name]. Patient verbalized understanding. Patient stated that she would discuss medication with Cardiologist at her visit in May and that she does not think that she need to have labs done at this time.

## 2017-06-02 ENCOUNTER — Encounter: Payer: Self-pay | Admitting: Cardiovascular Disease

## 2017-06-02 ENCOUNTER — Ambulatory Visit: Payer: Medicare Other | Admitting: Cardiovascular Disease

## 2017-06-02 VITALS — BP 90/62 | HR 76 | Ht 68.0 in | Wt 134.0 lb

## 2017-06-02 DIAGNOSIS — I481 Persistent atrial fibrillation: Secondary | ICD-10-CM

## 2017-06-02 DIAGNOSIS — Z7901 Long term (current) use of anticoagulants: Secondary | ICD-10-CM

## 2017-06-02 DIAGNOSIS — I4819 Other persistent atrial fibrillation: Secondary | ICD-10-CM

## 2017-06-02 NOTE — Patient Instructions (Signed)
Medication Instructions:  Your physician recommends that you continue on your current medications as directed. Please refer to the Current Medication list given to you today.   Labwork: TODAY - CBC, BMET   Testing/Procedures: None Ordered   Follow-Up: Your physician wants you to follow-up in: 1 year with Dr. [name].  You will receive a reminder letter in the mail two months in advance. If you don't receive a letter, please call our office to schedule the follow-up appointment.   If you need a refill on your cardiac medications before your next appointment, please call your pharmacy.   Thank you for choosing [facility]! Pheonix Wisby, RN [phone]    

## 2017-06-02 NOTE — Progress Notes (Signed)
CARDIOLOGY OFFICE NOTE  Date:  06/02/2017    Stephanie Shaw Date of Birth: 03-17-1940 Medical Record #[id number]  PCP:  [name], [name], MD  Cardiologist:  Aundra Dubin  -- now Greely Atiyeh   No chief complaint on file.  Problem List 1. Atrial fib 2. Essential tremor    Previous notes from [facility]:   Stephanie Shaw is a 77 y.o. female who presents today for a one year check.     She has a history of chronic atrial fibrillation. Patient has had prior history of PACs, but atrial fibrillation was noted for the first time on 4/12. She seems to have remained in atrial fibrillation since that time.  She is now taking diltiazem CD and Xarelto. She teaches at [employer].  Seen a year ago and was felt to be doing ok.  Comes back today. Here alone. She continues to do ok. She has no real awareness of her atrial fib. She has had 2 falls earlier this year - tripped both time - broke an arm each time. Will be getting bone scan set up. Just now really getting back to her baseline. She says she clearly tripped. No passing out. No chest pain. Not short of breath. Needs her Xarelto refilled.   May 29, 2016:  Seen for the first time today  Transfer from [place]. Hx of persistent atrial fib.   Asymptomatic  Still eats salt  Has had some leg swelling   Jun 02, 2017:  Has been diagnosed with Parkinsons diease last fall Has been having low BP readings. ( on Sinamet)  Occasional episode of dizziness.   She asked about being on Xarelto - with her increased risk of falling.    PMH:  1. Atrial fibrillation: First noted on 05/13/10. Had PACs noted prior to this. Persistent. Echo (5/12): EF 55%, normal wall thickness, normal valves, moderate biatrial enlargement, normal RV.  2. Duputren's contractures 3. Osteoarthritis 4. Venous insufficiency 5. Multinodular goiter: Nontoxic.   Past Medical History:  Diagnosis Date  . Colles' fracture of right radius 03/05/2015  . DEGENERATIVE JOINT  DISEASE, RIGHT HIP 02/16/2007  . Dupuytren's contracture   . Osteoarthritis of hip    right  . Osteoarthritis of left knee   . POSTMENOPAUSAL SYNDROME 02/16/2007  . PREMATURE ATRIAL CONTRACTIONS 02/16/2007  . S/P breast biopsy, left    two o'clock position - benign  . 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 Venous insufficiency     Past Surgical History:  Procedure Laterality Date  . BREAST BIOPSY Left   . CATARACT EXTRACTION, BILATERAL       Medications: Current Outpatient Medications  Medication Sig Dispense Refill  . carbidopa-levodopa (SINEMET IR) 25-100 MG tablet Take 1 tablet by mouth 3 (three) times daily. 270 tablet 0  . cholecalciferol (VITAMIN D) 400 units TABS tablet Take 400 Units by mouth daily.    Marland Kitchen diltiazem (CARDIZEM CD) 180 MG 24 hr capsule TAKE ONE CAPSULE EVERY DAY 90 capsule 3  . estradiol (ESTRACE VAGINAL) 0.1 MG/GM vaginal cream INSERT 1/4 OF APPLICATORFUL VAGINALLY TWICE PER WEEK 42.5 Shaw 3  . ferrous sulfate 325 (65 FE) MG EC tablet Take 325 mg by mouth 2 (two) times a week.     . Flaxseed, Linseed, (FLAX SEED OIL) 1000 MG CAPS Take 1 capsule by mouth daily.     Alveda Reasons 20 MG TABS tablet TAKE 1 TABLET BY MOUTH ONCE DAILY 90 tablet 3   No current facility-administered medications  for this visit.     Allergies: Allergies  Allergen Reactions  . Doxycycline Nausea And Vomiting  . Sulfonamide Derivatives     REACTION: HIVES    Social History: The patient  reports that she quit smoking about 45 years ago. She has never used smokeless tobacco. She reports that she does not drink alcohol or use drugs.   Family History: The patient's family history includes Alcohol abuse in her father; Cancer in her father and mother; Diabetes in her mother; Heart failure in her mother; Osteoarthritis in her sister.   Review of Systems: Please see the history of present illness.   Otherwise, the review of systems is positive for none.   All other systems are reviewed and  negative.    Physical Exam: Blood pressure 90/62, pulse 76, height 5\' 8"  (1.727 m), weight 134 lb (60.8 kg), SpO2 98 %.  GEN:   Chronically ill appearing female,  HEENT: Normal NECK: No JVD; No carotid bruits LYMPHATICS: No lymphadenopathy CARDIAC: irreg. Irreg.  RESPIRATORY:  Clear to auscultation without rales, wheezing or rhonchi  ABDOMEN: Soft, non-tender, non-distended MUSCULOSKELETAL:  No edema; No deformity  SKIN: Warm and dry NEUROLOGIC:  Alert and oriented x 3    LABORATORY DATA:  EKG:  Jun 02, 2017:   Atrial fib at 21.     Lab Results  Component Value Date   WBC 6.4 08/19/2016   HGB 13.5 08/19/2016   HCT 41.3 08/19/2016   PLT 182.0 08/19/2016   GLUCOSE 88 08/19/2016   CHOL 199 08/29/2015   TRIG 42.0 08/29/2015   HDL 88.20 08/29/2015   LDLDIRECT 104.2 01/25/2013   LDLCALC 103 (H) 08/29/2015   ALT 15 08/19/2016   AST 21 08/19/2016   NA 138 08/19/2016   K 4.3 08/19/2016   CL 101 08/19/2016   CREATININE 0.79 08/19/2016   BUN 14 08/19/2016   CO2 30 08/19/2016   TSH 1.00 08/19/2016    BNP (last 3 results) No results for input(s): BNP in the last 8760 hours.  ProBNP (last 3 results) No results for input(s): PROBNP in the last 8760 hours.    Assessment/Plan:  Atrial fibrillation  Stephanie Shaw presents with persistent atrial fibrillation.  She is basically asymptomatic.  She was a little worried about her risk of falling since she has been diagnosed with Parkinson's disease.  She has not fallen yet.  I think it is safest to continue the Xarelto for now.    Current medicines are reviewed with the patient today.  The patient does not have concerns regarding medicines other than what has been noted above.  The following changes have been made:  See above.  Labs/ tests ordered today include:   No orders of the defined types were placed in this encounter.    Mertie Moores, MD  06/02/2017 4:15 PM    [place] [address],   [place], [place]  [address] Pager [phone] Phone: [phone]; Fax: [phone]

## 2017-06-03 LAB — CBC
HEMATOCRIT: 38.3 % (ref 34.0–46.6)
HEMOGLOBIN: 12.1 g/dL (ref 11.1–15.9)
MCH: 30.1 pg (ref 26.6–33.0)
MCHC: 31.6 g/dL (ref 31.5–35.7)
MCV: 95 fL (ref 79–97)
Platelets: 199 10*3/uL (ref 150–379)
RBC: 4.02 x10E6/uL (ref 3.77–5.28)
RDW: 14.4 % (ref 12.3–15.4)
WBC: 6.4 10*3/uL (ref 3.4–10.8)

## 2017-06-03 LAB — BASIC METABOLIC PANEL
BUN/Creatinine Ratio: 16 (ref 12–28)
BUN: 16 mg/dL (ref 8–27)
CO2: 21 mmol/L (ref 20–29)
CREATININE: 1.03 mg/dL — AB (ref 0.57–1.00)
Calcium: 9.6 mg/dL (ref 8.7–10.3)
Chloride: 103 mmol/L (ref 96–106)
GFR calc Af Amer: 61 mL/min/{1.73_m2} (ref >59)
GFR, EST NON AFRICAN AMERICAN: 53 mL/min/{1.73_m2} — AB (ref >59)
Glucose: 102 mg/dL — ABNORMAL HIGH (ref 65–99)
Potassium: 4.5 mmol/L (ref 3.5–5.2)
SODIUM: 142 mmol/L (ref 134–144)

## 2017-06-08 ENCOUNTER — Ambulatory Visit: Payer: Medicare Other | Admitting: Sports Medicine

## 2017-06-15 ENCOUNTER — Other Ambulatory Visit: Payer: Self-pay | Admitting: Neurology

## 2017-06-16 ENCOUNTER — Telehealth: Payer: Self-pay | Admitting: Family Medicine

## 2017-06-16 NOTE — Telephone Encounter (Signed)
Form put on Dr. Doug Sou desk for completion, will fax when completed.

## 2017-06-16 NOTE — Telephone Encounter (Signed)
[place] form to be filled out.  Fax upon completion to: [phone], Attn: Leroy Sea.  Placed in Dr's folder.

## 2017-06-22 NOTE — Telephone Encounter (Signed)
Form faxed to Leroy Sea as requested.

## 2017-07-07 ENCOUNTER — Other Ambulatory Visit: Payer: Self-pay | Admitting: Cardiovascular Disease

## 2017-07-07 MED FILL — XARELTO 20 MG TABLET: 20 | 90 days supply | Qty: 90 | Fill #0

## 2017-07-07 NOTE — Telephone Encounter (Signed)
PA denied 04/24/17.   Diclofenac 1% gel is denied because the use is not supported by the Food and Drug Administration (FDA) or by one of the Medicare approved references for treating your medical condition(s): acute right shoulder pain. Coverage requires that the requested drug has been recognized for the treatment of your medical condition(s) by one of the following Medicare approved compendia: (1) [place]; OR (2) [place]  Patient has since been seen and treated by Dr. Paulla Fore.

## 2017-07-07 NOTE — Telephone Encounter (Signed)
Xarelto 20mg  refill request received; pt is 77 yrs old, wt-60.8kg, Crea-1.03 on 06/02/17, last seen by Dr. Acie Fredrickson on 06/02/17, CrCl-51.32ml/min; will send in refill to requested pharmacy.

## 2017-07-30 ENCOUNTER — Ambulatory Visit: Payer: Medicare Other | Attending: Family Medicine | Admitting: Occupational Therapy

## 2017-07-30 ENCOUNTER — Telehealth: Payer: Self-pay | Admitting: Physical Therapy

## 2017-07-30 ENCOUNTER — Ambulatory Visit: Payer: Medicare Other

## 2017-07-30 ENCOUNTER — Ambulatory Visit: Payer: Medicare Other | Admitting: Physical Therapy

## 2017-07-30 DIAGNOSIS — M25511 Pain in right shoulder: Secondary | ICD-10-CM

## 2017-07-30 DIAGNOSIS — R2689 Other abnormalities of gait and mobility: Secondary | ICD-10-CM | POA: Insufficient documentation

## 2017-07-30 DIAGNOSIS — G20 Parkinson's disease: Secondary | ICD-10-CM

## 2017-07-30 DIAGNOSIS — R471 Dysarthria and anarthria: Secondary | ICD-10-CM

## 2017-07-30 NOTE — Telephone Encounter (Signed)
Order entered

## 2017-07-30 NOTE — Therapy (Signed)
[facility] [address] [place], [place], [address] Phone: [phone]   Fax:  [phone]  Patient Details  Name: Stephanie Shaw MRN: [id number] Date of Birth: 08/21/1940 Referring Provider:  Alonza Bogus, MD  Encounter Date: 07/30/2017  Speech Therapy Parkinson's Disease Screen   Decibel Level today: mid-upper 60sdB  (WNL=70-72 dB) with sound level meter 30cm away from pt's mouth. Pt's conversational volume is lower than WNL.  Pt does not report difficulty in swallowing warranting further evaluation at this time. She was asked to keep closer track of throat clearing and coughing during meals.   Pt would benefit from speech-language eval for dysarthria - please order via EPIC or call [phone] to schedule.   [facility] ,[place], [place]  07/30/2017, 1:37 PM  [facility] [address] [place], [place], [address] Phone: [phone]   Fax:  [phone]

## 2017-07-30 NOTE — Therapy (Signed)
[facility] [address] [place], [place], [address] Phone: [phone]   Fax:  [phone]  Patient Details  Name: Stephanie Shaw MRN: [id number] Date of Birth: 07-Feb-1940 Referring Provider:  [name], [name], MD  Encounter Date: 07/30/2017  Physical Therapy Parkinson's Disease Screen   Timed Up and Go test:15.15 sec  (compared to 14.78 sec)  10 meter walk test:9 sec (3.64 ft/sec) (compared to 3.67 ft/sec)  5 time sit to stand test: 16.68 sec  (compared to 20.19 sec)   Pt's functional mobility measures appear similar or improved from last bout of therapy; however, pt voices significant concerns over slower mobility and balance.  She does not want to pursue therapy at this time due to stress of moving to [facility].  She is agreeable to pursuing PT eval after she completes her move.       Jovann Luse W. 07/30/2017, 8:38 AM Mady Haagensen, PT 07/30/17 10:15 AM Phone: [phone] Fax: [facility] [address] [place], [place], [address] Phone: [phone]   Fax:  [phone]

## 2017-07-30 NOTE — Progress Notes (Signed)
Stephanie Shaw was seen today in the movement disorders clinic for neurologic consultation at the request of [name], [name], MD.  The consultation is for the evaluation of tremor.  The records that were made available to me were reviewed.   Tremor: Yes.     How long has it been going on? 1-2 years  At rest or with activation?  With activation  Fam hx of tremor?  Yes.    Located where?  "all over" - jaw, voice, left leg, both arms  Affected by caffeine:  No. (1 cup coffee per day)  Affected by alcohol:  Doesn't drink enough to know  Affected by stress:  unsure  Affected by fatigue: unsure  Spills soup if on spoon:  Will be okay if holds spoon tight  Spills glass of liquid if full:  No.  Affects ADL's (tying shoes, brushing teeth, etc):  Yes.  , but just flossing teeth  Other Specific Symptoms:  Voice: noted vocal tremor Sleep: "I'm sleeping all day and all night"  Vivid Dreams:  No.  Acting out dreams:  No. Wet Pillows: occasionally Postural symptoms:  Yes.  , some  Falls?  Yes.  , 2 in the last year but "little falls" - the prior year had fx both arms Bradykinesia symptoms: difficulty getting out of a chair and difficulty regaining balance Loss of smell:  No. Loss of taste:  No. Urinary Incontinence:  No. Difficulty Swallowing:  No. (rarely has trouble) Handwriting, micrographia: Yes.   Trouble buttoning clothing: Yes.   Depression:  Yes.  , just mild because just retired and was troublesome for her Memory changes:  No. Hallucinations:  No.  visual distortions: No. N/V:  No. Lightheaded:  No.  Syncope: No. Diplopia:  No. Dyskinesia:  No.  Neuroimaging has previously been performed.  Patient had a CT of the brain in April, 2017 after a fall.  I reviewed this.  It was unremarkable.  11/26/16 update: Patient is seen in today for follow-up of testing results.  She is accompanied by her daughter who supplements the history.  She had a DaTscan in September, 2018.  I have  reviewed those results.  DaT scan officially showed Poor uptake of dopamine transport specific radiotracer within the LEFT and RIGHT putamen and decrease relative uptake in the head of the RIGHT caudate nucleus.  When I looked at the scan myself, there was mostly decreased uptake on the right.  MRI of the brain was done September 05, 2016.  I personally reviewed this.  It was normal.  She has been attending therapies at the rehab center.  She has started [employer].  The records that were made available to me were reviewed.  She did have a fall since last visit.  She fell in the grass.  Someone helped her get up and she didn't get hurt.    03/06/17 update: Patient is seen today in follow-up.  She was started on carbidopa/levodopa 25/100 last visit and worked 1 tablet 3 times per day.  She reports that she is a little more stable.  She attended rehab therapy since last visit.  The records that were made available to me were reviewed. Reports being involved with [employer] spin class and [employer] classes.  Pt denies falls.  Pt denies lightheadedness, near syncope.  No hallucinations.  Mood has been good and she denies depression.  Doesn't want medication.  "I stay busy."  "My daughter thinks I am going to  be like I was 20 years ago."  Going to [facility] next month to visit a friend. Restarted singing in choir.    08/04/17 update:  Pt seen in f/u for parkinsonism.  She is on carbidopa/levodopa 25/100, 1 tablet 3 times per day(7am/11:30am/5:30pm).  Feels shakey about 1 hour before takes 11:30 dose but doesn't note it so much in afternoon.  Pt denies falls but feels like balance is poor.  Pt denies lightheadedness, near syncope.  No hallucinations.  Mood has been good.  Last visit, her blood pressure was quite low.  She was on Cardizem.  Wanted her to address with cardiology.  I did review cardiology notes.  She does have persistent atrial fibrillation.  It does not appear her blood pressure was addressed.  She is in the  process of moving and is under a lot of stress as is selling her current home.  She is moving to [facility].  She is doing spin class for exercise 3 days per week.  "It really helps."  Is tired a lot  PREVIOUS MEDICATIONS: none to date  ALLERGIES:   Allergies  Allergen Reactions  . Doxycycline Nausea And Vomiting  . Sulfonamide Derivatives     REACTION: HIVES    CURRENT MEDICATIONS:  Outpatient Encounter Medications as of 08/04/2017  Medication Sig  . carbidopa-levodopa (SINEMET IR) 25-100 MG tablet TAKE 1 TABLET BY MOUTH THREE TIMES A DAY  . cholecalciferol (VITAMIN D) 400 units TABS tablet Take 400 Units by mouth daily.  Marland Kitchen diltiazem (CARDIZEM CD) 180 MG 24 hr capsule TAKE ONE CAPSULE EVERY DAY  . estradiol (ESTRACE VAGINAL) 0.1 MG/GM vaginal cream INSERT 1/4 OF APPLICATORFUL VAGINALLY TWICE PER WEEK  . ferrous sulfate 325 (65 FE) MG EC tablet Take 325 mg by mouth 2 (two) times a week.   . Flaxseed, Linseed, (FLAX SEED OIL) 1000 MG CAPS Take 1 capsule by mouth daily.   Alveda Reasons 20 MG TABS tablet TAKE 1 TABLET BY MOUTH ONCE DAILY   No facility-administered encounter medications on file as of 08/04/2017.     PAST MEDICAL HISTORY:   Past Medical History:  Diagnosis Date  . Colles' fracture of right radius 03/05/2015  . DEGENERATIVE JOINT DISEASE, RIGHT HIP 02/16/2007  . Dupuytren's contracture   . Osteoarthritis of hip    right  . Osteoarthritis of left knee   . POSTMENOPAUSAL SYNDROME 02/16/2007  . PREMATURE ATRIAL CONTRACTIONS 02/16/2007  . S/P breast biopsy, left    two o'clock position - benign  . 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 Venous insufficiency     PAST SURGICAL HISTORY:   Past Surgical History:  Procedure Laterality Date  . BREAST BIOPSY Left   . CATARACT EXTRACTION, BILATERAL      SOCIAL HISTORY:   Social History   Socioeconomic History  . Marital status: Divorced    Spouse name: Not on file  . Number of children: Not on file  . Years of  education: Not on file  . Highest education level: Not on file  Occupational History  . Occupation: retired    Comment: math, [employer]  Social Needs  . Financial resource strain: Not on file  . Food insecurity:    Worry: Not on file    Inability: Not on file  . Transportation needs:    Medical: Not on file    Non-medical: Not on file  Tobacco Use  . Smoking status: Former Smoker    Last attempt to quit: 08/27/1971  Years since quitting: 45.9  . Smokeless tobacco: Never Used  Substance and Sexual Activity  . Alcohol use: No    Alcohol/week: 0.0 oz    Comment: hardly ever  . Drug use: No  . Sexual activity: Not on file  Lifestyle  . Physical activity:    Days per week: Not on file    Minutes per session: Not on file  . Stress: Not on file  Relationships  . Social connections:    Talks on phone: Not on file    Gets together: Not on file    Attends religious service: Not on file    Active member of club or organization: Not on file    Attends meetings of clubs or organizations: Not on file    Relationship status: Not on file  . Intimate partner violence:    Fear of current or ex partner: Not on file    Emotionally abused: Not on file    Physically abused: Not on file    Forced sexual activity: Not on file  Other Topics Concern  . Not on file  Social History Narrative  . Not on file    FAMILY HISTORY:   Family Status  Relation Name Status  . Father  Deceased  . Mother  Deceased  . Sister  Alive  . Sister  Alive  . Child 2 Alive  . Neg Hx  (Not Specified)    ROS:   A complete 10 system review of systems was obtained and was unremarkable apart from what is mentioned above.  PHYSICAL EXAMINATION:    VITALS:   Vitals:   08/04/17 1047  BP: 106/68  Pulse: 86  SpO2: 97%  Weight: 132 lb (59.9 kg)  Height: 5\' 8"  (1.727 m)   GEN:  The patient appears stated age and is in NAD. HEENT:  Normocephalic, atraumatic.  The mucous membranes are moist. The superficial  temporal arteries are without ropiness or tenderness. CV:  Irreg.  irreg Lungs:  CTAB Neck/HEME:  There are no carotid bruits bilaterally.  Neurological examination:  Orientation: The patient is alert and oriented x3. Cranial nerves: There is good facial symmetry. The speech is fluent and clear. Soft palate rises symmetrically and there is no tongue deviation. Hearing is intact to conversational tone. Sensation: Sensation is intact to light touch throughout Motor: Strength is 5/5 in the bilateral upper and lower extremities.   Shoulder shrug is equal and symmetric.  There is no pronator drift.  Movement examination: Tone: There is mild increased tone in the LUE Abnormal movements: rare tremor in the L thumb Coordination:  There is decremation with RAM's, with any form of RAMS, including alternating supination and pronation of the forearm, hand opening and closing, finger taps, heel taps and toe taps on the thumb Gait and Station: The patient pushes off of the chair to arise.  She has decreased arm swing on the L.  She has slight shuffling   Labs:  Lab Results  Component Value Date   VITAMINB12 1,238 (H) 08/19/2016     Chemistry      Component Value Date/Time   NA 142 06/02/2017 1630   K 4.5 06/02/2017 1630   CL 103 06/02/2017 1630   CO2 21 06/02/2017 1630   BUN 16 06/02/2017 1630   CREATININE 1.03 (H) 06/02/2017 1630   CREATININE 0.92 06/14/2015 0853      Component Value Date/Time   CALCIUM 9.6 06/02/2017 1630   ALKPHOS 46 08/19/2016 1029   AST  21 08/19/2016 1029   ALT 15 08/19/2016 1029   BILITOT 0.6 08/19/2016 1029     Lab Results  Component Value Date   TSH 1.00 08/19/2016      ASSESSMENT/PLAN:  1.  Parkinsons disease  -DaTscan was positive in September, 2018  -looks worse today.  Increase carbidopa/levodopa 25/100 to 1 po qid (8am/11am/2pm/5pm).    -had derm visit on 7/12  -invited to PARTS program  2.  Mild anxiety and depression  -she denies any  depression.   Will continue to monitor  3.  Low blood pressure  -BP remains low.  On cardizem for a-fib.  Have wondered if dose can be tapered.  Has seen cardiology but has not been addressed. Wonder if this is where fatigue stems from along with stress of moving  4.  Follow up is anticipated in the next few months, sooner should new neurologic issues arise.  Much greater than 50% of this visit was spent in counseling and coordinating care.  Total face to face time:  25 min     Cc:  [name], [name], MD

## 2017-07-30 NOTE — Telephone Encounter (Signed)
Stephanie Shaw was seen for multi-disciplinary PD screens today and would benefit from speech therapy and physical therapy evaluations.  She would like to wait for these to be scheduled until after she completes her move to [facility].  However, if you agree, could you please send PT and speech therapy eval orders via Epic?  Once received, someone from our office will contact her to schedule.  Thank you.  Mady Haagensen, PT

## 2017-07-30 NOTE — Therapy (Signed)
[facility] [address] [place], [place], [address] Phone: [phone]   Fax:  [phone]  Patient Details  Name: Stephanie Shaw MRN: [id number] Date of Birth: 09/22/1940 Referring Provider:  [name], [name], MD  Encounter Date: 07/30/2017 Occupational Therapy Parkinson's Disease Screen   Physical Performance Test item #4 (donning/doffing jacket):  16.57 sec  Fastening/unfastening 3 buttons in:  37.59 sec  9-hole peg test:    RUE  26.97 sec        LUE  32.79 sec    Change in ability to perform ADLs/IADLs:  Mild-moderate decrease in letter size , micrographia noted.  Pt reports shoulder pain.  Therapist encouraged pt to participate in an OT evaluation due to shoulder pain. Pt declined at this time as she is in the process of moving  Pt would benefit from occupational therapy evaluation due to  Shoulder pain and  bradykinesia, however pt declined. Recommend occupational therapy screen in   6 mons   Aslee Such 07/30/2017, 8:08 AM  [facility] [address] [place], [place], [address] Phone: [phone]   Fax:  [phone]

## 2017-08-04 ENCOUNTER — Ambulatory Visit: Payer: Medicare Other | Admitting: Neurology

## 2017-08-04 ENCOUNTER — Encounter: Payer: Self-pay | Admitting: Neurology

## 2017-08-04 VITALS — BP 106/68 | HR 86 | Ht 68.0 in | Wt 132.0 lb

## 2017-08-04 DIAGNOSIS — I959 Hypotension, unspecified: Secondary | ICD-10-CM | POA: Diagnosis not present

## 2017-08-04 DIAGNOSIS — G20 Parkinson's disease: Secondary | ICD-10-CM

## 2017-08-04 MED ORDER — CARBIDOPA-LEVODOPA 25-100 MG PO TABS: 1.0000 | ORAL_TABLET | Freq: Four times a day (QID) | ORAL | 1 refills | Status: DC

## 2017-08-04 NOTE — Patient Instructions (Signed)
Increase carbidopa/levodopa 25/100 to 1 tablet at 8am/11am/2pm/5pm

## 2017-10-12 MED FILL — XARELTO 20 MG TABLET: 20 | 90 days supply | Qty: 90 | Fill #1

## 2017-10-28 ENCOUNTER — Ambulatory Visit: Payer: Medicare Other

## 2017-10-28 ENCOUNTER — Encounter: Payer: Medicare Other | Admitting: Family Medicine

## 2017-11-02 NOTE — Progress Notes (Signed)
Subjective:   Stephanie Shaw is a 77 y.o. female who presents for Medicare Annual (Subsequent) preventive examination.  Reports health as fair Has parkinson's is slowing her down Seen by Dr. [name] 02/06/2017  Dr. Carles Collet, 08/04/2017 Parkinson's   [facility]  Has supper available  Eat other meals in her room  Diet Chol/hdl 2 Weight is 135  Breakfast cereal and fruit Lunch; varies cottage cheese and fruit  Cheese and crackers Humus red pepper Dinner complete at friends[facility] home    Exercise Going to a spin class x 2 per week Parkinson's exercise class Big movement   There are no preventive care reminders to display for this patient.   Colonoscopy 05/2014 - repeat 05/2019 Mammogram 11/2016-  Dr. [name] told her q other year  Dexa 06/2015 -1.9  Exercise is helping bones strength   Cardiac Risk Factors include: advanced age (>3men, >15 women);dyslipidemia;family history of premature cardiovascular disease     Objective:     Vitals: BP 104/68   Pulse 85   Ht 5\' 8"  (1.727 m)   Wt 135 lb (61.2 kg)   BMI 20.53 kg/m   Body mass index is 20.53 kg/m.  Advanced Directives 11/03/2017 10/29/2016 04/22/2015 06/12/2014  Does Patient Have a Medical Advance Directive? Yes (No Data) No Yes  Type of Advance Directive - - - Living will  Copy of [place] in Chart? - - - No - copy requested  Would patient like information on creating a medical advance directive? - - No - patient declined information -   HCPOA has been completed and is her dtr   Tobacco Social History   Tobacco Use  Smoking Status Former Smoker  . Last attempt to quit: 08/27/1971  . Years since quitting: 46.2  Smokeless Tobacco Never Used     Counseling given: Yes   Clinical Intake:     Past Medical History:  Diagnosis Date  . Colles' fracture of right radius 03/05/2015  . DEGENERATIVE JOINT DISEASE, RIGHT HIP 02/16/2007  . Dupuytren's contracture   . Osteoarthritis of hip      right  . Osteoarthritis of left knee   . POSTMENOPAUSAL SYNDROME 02/16/2007  . PREMATURE ATRIAL CONTRACTIONS 02/16/2007  . S/P breast biopsy, left    two o'clock position - benign  . 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 Venous insufficiency    Past Surgical History:  Procedure Laterality Date  . BREAST BIOPSY Left   . CATARACT EXTRACTION, BILATERAL     Family History  Problem Relation Age of Onset  . Cancer Father   . Alcohol abuse Father   . Cancer Mother   . Diabetes Mother   . Heart failure Mother   . Osteoarthritis Sister   . Breast cancer Neg Hx    Social History   Socioeconomic History  . Marital status: Divorced    Spouse name: Not on file  . Number of children: Not on file  . Years of education: Not on file  . Highest education level: Not on file  Occupational History  . Occupation: retired    Comment: math, [employer]  Social Needs  . Financial resource strain: Not on file  . Food insecurity:    Worry: Not on file    Inability: Not on file  . Transportation needs:    Medical: Not on file    Non-medical: Not on file  Tobacco Use  . Smoking status: Former Smoker    Last attempt to  quit: 08/27/1971    Years since quitting: 46.2  . Smokeless tobacco: Never Used  Substance and Sexual Activity  . Alcohol use: No    Alcohol/week: 0.0 standard drinks    Comment: hardly ever  . Drug use: No  . Sexual activity: Not on file  Lifestyle  . Physical activity:    Days per week: Not on file    Minutes per session: Not on file  . Stress: Not on file  Relationships  . Social connections:    Talks on phone: Not on file    Gets together: Not on file    Attends religious service: Not on file    Active member of club or organization: Not on file    Attends meetings of clubs or organizations: Not on file    Relationship status: Not on file  Other Topics Concern  . Not on file  Social History Narrative  . Not on file    Outpatient Encounter Medications as of  11/03/2017  Medication Sig  . carbidopa-levodopa (SINEMET IR) 25-100 MG tablet Take 1 tablet by mouth 4 (four) times daily.  . cholecalciferol (VITAMIN D) 400 units TABS tablet Take 400 Units by mouth daily.  Marland Kitchen diltiazem (CARDIZEM CD) 180 MG 24 hr capsule TAKE ONE CAPSULE EVERY DAY  . estradiol (ESTRACE VAGINAL) 0.1 MG/GM vaginal cream INSERT 1/4 OF APPLICATORFUL VAGINALLY TWICE PER WEEK  . ferrous sulfate 325 (65 FE) MG EC tablet Take 325 mg by mouth 2 (two) times a week.   . Flaxseed, Linseed, (FLAX SEED OIL) 1000 MG CAPS Take 1 capsule by mouth daily.   Alveda Reasons 20 MG TABS tablet TAKE 1 TABLET BY MOUTH ONCE DAILY   No facility-administered encounter medications on file as of 11/03/2017.     Activities of Daily Living In your present state of health, do you have any difficulty performing the following activities: 11/03/2017 02/16/2017  Hearing? N Y  Vision? N Y  Difficulty concentrating or making decisions? N N  Walking or climbing stairs? Y Y  Dressing or bathing? N Y  Doing errands, shopping? N N  Preparing Food and eating ? N -  Using the Toilet? N -  In the past six months, have you accidently leaked urine? N -  Comment not anything that is not age related  -  Do you have problems with loss of bowel control? N -  Managing your Medications? N -  Managing your Finances? N -  Housekeeping or managing your Housekeeping? N -  Some recent data might be hidden    Patient Care Team: [name], [name], MD as PCP - General (Family Medicine) [name], [name], MD as PCP - Cardiology (Cardiology)    Assessment:   This is a routine wellness examination for Stephanie Shaw.  Exercise Activities and Dietary recommendations Current Exercise Habits: Home exercise routine;Structured exercise class, Time (Minutes): 60, Frequency (Times/Week): 3, Weekly Exercise (Minutes/Week): 180, Intensity: Moderate  Goals    . Patient Stated     Keep exercising and keep moving !       Fall Risk Fall Risk   11/03/2017 08/04/2017 03/06/2017 11/26/2016 08/26/2016  Falls in the past year? No No Yes Yes Yes  Number falls in past yr: - - 2 or more 2 or more 2 or more  Injury with Fall? - - No No No  Risk Factor Category  - - High Fall Risk High Fall Risk -  Follow up - - Falls evaluation completed Falls evaluation  completed Falls evaluation completed    Depression Screen PHQ 2/9 Scores 11/03/2017 08/19/2016 04/29/2015 03/05/2015  PHQ - 2 Score 0 0 0 0     Cognitive Function MMSE - Mini Mental State Exam 11/03/2017  Not completed: (No Data)     Ad8 score reviewed for issues:  Issues making decisions:  Less interest in hobbies / activities:  Repeats questions, stories (family complaining):  Trouble using ordinary gadgets (microwave, computer, phone):  Forgets the month or year:   Mismanaging finances:   Remembering appts:  Daily problems with thinking and/or memory: Ad8 score is=0        Immunization History  Administered Date(s) Administered  . Influenza Split 10/20/2012  . Influenza Whole 10/21/2006  . Influenza, High Dose Seasonal PF 10/17/2016  . Influenza-Unspecified 10/20/2013, 10/22/2017  . Pneumococcal Conjugate-13 01/31/2013  . Pneumococcal Polysaccharide-23 01/20/2005, 11/24/2011  . Td 01/21/2004  . Tdap 03/16/2014  . Zoster 04/17/2009      Screening Tests Health Maintenance  Topic Date Due  . COLONOSCOPY  06/12/2019  . TETANUS/TDAP  03/16/2024  . INFLUENZA VACCINE  Completed  . DEXA SCAN  Completed  . PNA vac Low Risk Adult  Completed        Plan:      PCP Notes   Health Maintenance [facility] gave her flu vaccine and this was put in epic, unfortunately, she felt it made her sick (high dose)   Colonoscopy 05/2014 - repeat 05/2019 Mammogram 11/2016-  Dr. [name] told her q other year - discussed risk and will decide later if she wants to repeat this year  Dexa 06/2015 -1.9  Exercise is helping bones strength  Will discuss Shingrix with Dr.  Carles Collet prior to taking it   Abnormal Screens  Gait was slow but much better after walking down the hall. No falls, is exercising 3 days a week and walking at [facility]   Referrals  none  Patient concerns; Her Parkinson's is her main focus  Nurse Concerns; As noted  Next PCP apt Was seen today        I have personally reviewed and noted the following in the patient's chart:   . Medical and social history . Use of alcohol, tobacco or illicit drugs  . Current medications and supplements . Functional ability and status . Nutritional status . Physical activity . Advanced directives . List of other physicians . Hospitalizations, surgeries, and ER visits in previous 12 months . Vitals . Screenings to include cognitive, depression, and falls . Referrals and appointments  In addition, I have reviewed and discussed with patient certain preventive protocols, quality metrics, and best practice recommendations. A written personalized care plan for preventive services as well as general preventive health recommendations were provided to patient.     Wynetta Fines, RN  11/03/2017

## 2017-11-03 ENCOUNTER — Ambulatory Visit (INDEPENDENT_AMBULATORY_CARE_PROVIDER_SITE_OTHER): Payer: Medicare Other | Admitting: Family Medicine

## 2017-11-03 ENCOUNTER — Encounter: Payer: Self-pay | Admitting: Family Medicine

## 2017-11-03 ENCOUNTER — Ambulatory Visit (INDEPENDENT_AMBULATORY_CARE_PROVIDER_SITE_OTHER): Payer: Medicare Other

## 2017-11-03 VITALS — BP 104/68 | HR 85 | Temp 97.9°F | Resp 16 | Ht 68.0 in | Wt 135.1 lb

## 2017-11-03 VITALS — BP 104/68 | HR 85 | Ht 68.0 in | Wt 135.0 lb

## 2017-11-03 DIAGNOSIS — Z0000 Encounter for general adult medical examination without abnormal findings: Secondary | ICD-10-CM

## 2017-11-03 DIAGNOSIS — N952 Postmenopausal atrophic vaginitis: Secondary | ICD-10-CM

## 2017-11-03 DIAGNOSIS — I4891 Unspecified atrial fibrillation: Secondary | ICD-10-CM | POA: Diagnosis not present

## 2017-11-03 DIAGNOSIS — R944 Abnormal results of kidney function studies: Secondary | ICD-10-CM | POA: Diagnosis not present

## 2017-11-03 LAB — BASIC METABOLIC PANEL
BUN: 17 mg/dL (ref 6–23)
CHLORIDE: 104 meq/L (ref 96–112)
CO2: 30 mEq/L (ref 19–32)
CREATININE: 0.73 mg/dL (ref 0.40–1.20)
Calcium: 9.6 mg/dL (ref 8.4–10.5)
GFR: 82.01 mL/min (ref >60.00)
GLUCOSE: 95 mg/dL (ref 70–99)
Potassium: 4.2 mEq/L (ref 3.5–5.1)
Sodium: 140 mEq/L (ref 135–145)

## 2017-11-03 NOTE — Patient Instructions (Addendum)
A few things to remember from today's visit:   Routine general medical examination at a health care facility  Abnormal renal function test - Plan: Basic metabolic panel  A few tips:  -As we age balance is not as good as it was, so there is a higher risks for falls. Please remove small rugs and furniture that is "in your way" and could increase the risk of falls. Stretching exercises may help with fall prevention: Yoga and Tai Chi are some examples. Low impact exercise is better, so you are not very achy the next day.  -Sun screen and avoidance of direct sun light recommended. Caution with dehydration, if working outdoors be sure to drink enough fluids.  - Some medications are not safe as we age, increases the risk of side effects and can potentially interact with other medication you are also taken;  including some of over the counter medications. Be sure to let me know when you start a new medication even if it is a dietary/vitamin supplement.   -Healthy diet low in red meet/animal fat and sugar + regular physical activity is recommended.      Please be sure medication list is accurate. If a new problem present, please set up appointment sooner than planned today.

## 2017-11-03 NOTE — Progress Notes (Signed)
HPI:   Stephanie Shaw is a 77 y.o. female, who is here today for her routine physical.  Last CPE: 09/2015  Regular exercise 3 or more time per week: Yes. She does spinning class 2 times per week and Parkinson's class once per week. She stopped PT because did not feel like it was helping.   Following a healthy diet: Yes She lives alone. Recently moved to independent living  Chronic medical problems:   Atrial fibrillation, currently she is on Cardizem CD 180 mg daily and Xarelto 20 mg daily. She follows periodically with cardiologist,Dr [name] Hearing loss, she wears hearing aids. Nontoxic multinodular goiter, she follows with endocrinologist, Dr. Loanne Drilling. Parkinson's disease, she follows with neurologist, Dr. Carles Collet.  Sleeping well.  Appetite has decreased because her taste sense has changed.  She is still driving.  Vaginal atrophy, she is on Estrace vaginal cream 2 times per week. She has not noted vaginal bleeding or discharge.    Immunization History  Administered Date(s) Administered  . Influenza Split 10/20/2012  . Influenza Whole 10/21/2006  . Influenza, High Dose Seasonal PF 10/17/2016  . Influenza-Unspecified 10/20/2013, 10/22/2017  . Pneumococcal Conjugate-13 01/31/2013  . Pneumococcal Polysaccharide-23 01/20/2005, 11/24/2011  . Td 01/21/2004  . Tdap 03/16/2014  . Zoster 04/17/2009    Mammogram: 11/2016. Colonoscopy: 05/2014 DEXA:06/2015, osteopenia. She is on calcium (forgets sometimes) vitamin D supplementation.   She has no concerns today.  05/2017 mildly abnormal renal function test. She has not been drinking fluids during the day,she is trying to do better. No decreased in urine output,gross hematuria,or foam in urine.  Review of Systems  Constitutional: Positive for appetite change and fatigue. Negative for activity change and fever.  HENT: Negative for mouth sores, nosebleeds and trouble swallowing.   Eyes: Negative for redness and  visual disturbance.  Respiratory: Negative for cough, shortness of breath and wheezing.   Cardiovascular: Negative for chest pain, palpitations and leg swelling.  Gastrointestinal: Negative for abdominal pain, nausea and vomiting.       Negative for changes in bowel habits.  Endocrine: Negative for polydipsia, polyphagia and polyuria.  Genitourinary: Negative for decreased urine volume, dysuria and hematuria.  Musculoskeletal: Positive for arthralgias and gait problem.  Skin: Negative for rash and wound.  Neurological: Positive for tremors. Negative for syncope, weakness and headaches.  Psychiatric/Behavioral: Positive for sleep disturbance. Negative for confusion. The patient is nervous/anxious.     Current Outpatient Medications on File Prior to Visit  Medication Sig Dispense Refill  . carbidopa-levodopa (SINEMET IR) 25-100 MG tablet Take 1 tablet by mouth 4 (four) times daily. 360 tablet 1  . cholecalciferol (VITAMIN D) 400 units TABS tablet Take 400 Units by mouth daily.    Marland Kitchen diltiazem (CARDIZEM CD) 180 MG 24 hr capsule TAKE ONE CAPSULE EVERY DAY 90 capsule 3  . estradiol (ESTRACE VAGINAL) 0.1 MG/GM vaginal cream INSERT 1/4 OF APPLICATORFUL VAGINALLY TWICE PER WEEK 42.5 g 3  . ferrous sulfate 325 (65 FE) MG EC tablet Take 325 mg by mouth 2 (two) times a week.     . Flaxseed, Linseed, (FLAX SEED OIL) 1000 MG CAPS Take 1 capsule by mouth daily.     Alveda Reasons 20 MG TABS tablet TAKE 1 TABLET BY MOUTH ONCE DAILY 90 tablet 3   No current facility-administered medications on file prior to visit.      Past Medical History:  Diagnosis Date  . Colles' fracture of right radius 03/05/2015  . DEGENERATIVE JOINT DISEASE,  RIGHT HIP 02/16/2007  . Dupuytren's contracture   . Osteoarthritis of hip    right  . Osteoarthritis of left knee   . POSTMENOPAUSAL SYNDROME 02/16/2007  . PREMATURE ATRIAL CONTRACTIONS 02/16/2007  . S/P breast biopsy, left    two o'clock position - benign  . 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 Venous insufficiency     Past Surgical History:  Procedure Laterality Date  . BREAST BIOPSY Left   . CATARACT EXTRACTION, BILATERAL      Allergies  Allergen Reactions  . Doxycycline Nausea And Vomiting  . Sulfonamide Derivatives     REACTION: HIVES    Family History  Problem Relation Age of Onset  . Cancer Father   . Alcohol abuse Father   . Cancer Mother   . Diabetes Mother   . Heart failure Mother   . Osteoarthritis Sister   . Breast cancer Neg Hx     Social History   Socioeconomic History  . Marital status: Divorced    Spouse name: Not on file  . Number of children: Not on file  . Years of education: Not on file  . Highest education level: Not on file  Occupational History  . Occupation: retired    Comment: math, [employer]  Social Needs  . Financial resource strain: Not on file  . Food insecurity:    Worry: Not on file    Inability: Not on file  . Transportation needs:    Medical: Not on file    Non-medical: Not on file  Tobacco Use  . Smoking status: Former Smoker    Last attempt to quit: 08/27/1971    Years since quitting: 46.2  . Smokeless tobacco: Never Used  Substance and Sexual Activity  . Alcohol use: No    Alcohol/week: 0.0 standard drinks    Comment: hardly ever  . Drug use: No  . Sexual activity: Not on file  Lifestyle  . Physical activity:    Days per week: Not on file    Minutes per session: Not on file  . Stress: Not on file  Relationships  . Social connections:    Talks on phone: Not on file    Gets together: Not on file    Attends religious service: Not on file    Active member of club or organization: Not on file    Attends meetings of clubs or organizations: Not on file    Relationship status: Not on file  Other Topics Concern  . Not on file  Social History Narrative  . Not on file     Vitals:   11/03/17 0750  BP: 104/68  Pulse: 85  Resp: 16  Temp: 97.9 F (36.6 C)  SpO2: 98%   Body mass index  is 20.55 kg/m.   Wt Readings from Last 3 Encounters:  11/03/17 135 lb (61.2 kg)  11/03/17 135 lb 2 oz (61.3 kg)  08/04/17 132 lb (59.9 kg)     Physical Exam  Nursing note and vitals reviewed. Constitutional: She is oriented to person, place, and time. She appears well-developed and well-nourished. No distress.  HENT:  Head: Normocephalic and atraumatic.  Right Ear: Tympanic membrane, external ear and ear canal normal. Decreased hearing is noted.  Left Ear: Tympanic membrane, external ear and ear canal normal. Decreased hearing is noted.  Mouth/Throat: Uvula is midline, oropharynx is clear and moist and mucous membranes are normal.  Eyes: Pupils are equal, round, and reactive to light. Conjunctivae and EOM are normal.  Neck: No tracheal deviation present. Thyromegaly present.  Cardiovascular: Normal rate. An irregular rhythm present.  No murmur heard. Pulses:      Dorsalis pedis pulses are 2+ on the right side, and 2+ on the left side.  Respiratory: Effort normal and breath sounds normal. No respiratory distress.  GI: Soft. She exhibits no mass. There is no hepatomegaly. There is no tenderness.  Musculoskeletal: She exhibits no edema.  Lymphadenopathy:    She has no cervical adenopathy.  Neurological: She is alert and oriented to person, place, and time. She has normal strength. She displays tremor. No cranial nerve deficit. Gait abnormal.  Reflex Scores:      Bicep reflexes are 2+ on the right side and 2+ on the left side.      Patellar reflexes are 2+ on the right side and 2+ on the left side. Difficulty getting up from chair. Unstable gait with no assistance.  Skin: Skin is warm. No rash noted. No erythema.  Psychiatric: She has a normal mood and affect.  Well groomed, good eye contact.      ASSESSMENT AND PLAN:  Stephanie Shaw was here today annual physical examination.   Orders Placed This Encounter  Procedures  . Basic metabolic panel    Lab Results    Component Value Date   CREATININE 0.73 11/03/2017   BUN 17 11/03/2017   NA 140 11/03/2017   K 4.2 11/03/2017   CL 104 11/03/2017   CO2 30 11/03/2017    Routine general medical examination at a health care facility  We discussed the importance of regular physical activity and healthy diet for prevention of chronic illness and/or complications. Preventive guidelines reviewed. Vaccination up to date. Last eye exam 07/2017, she got a new eye glasses Rx. Ca++ and vit D supplementation to continue. Ca++ supplementation ideally through her diet. Next CPE in a year.  Abnormal renal function test  Adequate hydration. Further recommendations will be given according to BMP results.  -     Basic metabolic panel   Vaginal atrophy Problem stable. No changes in estrace vaginal cream. F/U annually.  Atrial fibrillation Stable. No changes in Cardizem or Xarelto. Continue following with cardiologist.    Return in 1 year (on 11/04/2018) for CPE.    [name]. [name], MD  [facility]. [place] office.

## 2017-11-03 NOTE — Assessment & Plan Note (Signed)
Stable. No changes in Cardizem or Xarelto. Continue following with cardiologist.

## 2017-11-03 NOTE — Assessment & Plan Note (Signed)
Problem stable. No changes in estrace vaginal cream. F/U annually.

## 2017-11-03 NOTE — Patient Instructions (Signed)
Ms. Scioneaux , Thank you for taking time to come for your Medicare Wellness Visit. I appreciate your ongoing commitment to your health goals. Please review the following plan we discussed and let me know if I can assist you in the future.   Educated to check with insurance regarding coverage of Shingles vaccination on Part D or Part B and may have lower co-pay if provided on the Part D side  Will discuss with Dr. Carles Collet since you had a reaction to the high dose  Shingrix is a vaccine for the prevention of Shingles in Adults 40 and older.  If you are on Medicare, the shingrix is covered under your Part D plan, so you will take both of the vaccines in the series at your pharmacy. Please check with your benefits regarding applicable copays or out of pocket expenses.  The Shingrix is given in 2 vaccines approx 8 weeks apart. You must receive the 2nd dose prior to 6 months from receipt of the first. Please have the pharmacist print out you Immunization  dates for our office records    These are the goals we discussed: Goals    . Patient Stated     Keep exercising and keep moving !       This is a list of the screening recommended for you and due dates:  Health Maintenance  Topic Date Due  . Colon Cancer Screening  06/12/2019  . Tetanus Vaccine  03/16/2024  . Flu Shot  Completed  . DEXA scan (bone density measurement)  Completed  . Pneumonia vaccines  Completed    Health Maintenance for Postmenopausal Women Menopause is a normal process in which your reproductive ability comes to an end. This process happens gradually over a span of months to years, usually between the ages of 27 and 45. Menopause is complete when you have missed 12 consecutive menstrual periods. It is important to talk with your health care provider about some of the most common conditions that affect postmenopausal women, such as heart disease, cancer, and bone loss (osteoporosis). Adopting a healthy lifestyle and getting  preventive care can help to promote your health and wellness. Those actions can also lower your chances of developing some of these common conditions. What should I know about menopause? During menopause, you may experience a number of symptoms, such as:  Moderate-to-severe hot flashes.  Night sweats.  Decrease in sex drive.  Mood swings.  Headaches.  Tiredness.  Irritability.  Memory problems.  Insomnia.  Choosing to treat or not to treat menopausal changes is an individual decision that you make with your health care provider. What should I know about hormone replacement therapy and supplements? Hormone therapy products are effective for treating symptoms that are associated with menopause, such as hot flashes and night sweats. Hormone replacement carries certain risks, especially as you become older. If you are thinking about using estrogen or estrogen with progestin treatments, discuss the benefits and risks with your health care provider. What should I know about heart disease and stroke? Heart disease, heart attack, and stroke become more likely as you age. This may be due, in part, to the hormonal changes that your body experiences during menopause. These can affect how your body processes dietary fats, triglycerides, and cholesterol. Heart attack and stroke are both medical emergencies. There are many things that you can do to help prevent heart disease and stroke:  Have your blood pressure checked at least every 1-2 years. High blood pressure causes heart  disease and increases the risk of stroke.  If you are 29-65 years old, ask your health care provider if you should take aspirin to prevent a heart attack or a stroke.  Do not use any tobacco products, including cigarettes, chewing tobacco, or electronic cigarettes. If you need help quitting, ask your health care provider.  It is important to eat a healthy diet and maintain a healthy weight. ? Be sure to include plenty of  vegetables, fruits, low-fat dairy products, and lean protein. ? Avoid eating foods that are high in solid fats, added sugars, or salt (sodium).  Get regular exercise. This is one of the most important things that you can do for your health. ? Try to exercise for at least 150 minutes each week. The type of exercise that you do should increase your heart rate and make you sweat. This is known as moderate-intensity exercise. ? Try to do strengthening exercises at least twice each week. Do these in addition to the moderate-intensity exercise.  Know your numbers.Ask your health care provider to check your cholesterol and your blood glucose. Continue to have your blood tested as directed by your health care provider.  What should I know about cancer screening? There are several types of cancer. Take the following steps to reduce your risk and to catch any cancer development as early as possible. Breast Cancer  Practice breast self-awareness. ? This means understanding how your breasts normally appear and feel. ? It also means doing regular breast self-exams. Let your health care provider know about any changes, no matter how small.  If you are 38 or older, have a clinician do a breast exam (clinical breast exam or CBE) every year. Depending on your age, family history, and medical history, it may be recommended that you also have a yearly breast X-ray (mammogram).  If you have a family history of breast cancer, talk with your health care provider about genetic screening.  If you are at high risk for breast cancer, talk with your health care provider about having an MRI and a mammogram every year.  Breast cancer (BRCA) gene test is recommended for women who have family members with BRCA-related cancers. Results of the assessment will determine the need for genetic counseling and BRCA1 and for BRCA2 testing. BRCA-related cancers include these types: ? Breast. This occurs in males or  females. ? Ovarian. ? Tubal. This may also be called fallopian tube cancer. ? Cancer of the abdominal or pelvic lining (peritoneal cancer). ? Prostate. ? Pancreatic.  Cervical, Uterine, and Ovarian Cancer Your health care provider may recommend that you be screened regularly for cancer of the pelvic organs. These include your ovaries, uterus, and vagina. This screening involves a pelvic exam, which includes checking for microscopic changes to the surface of your cervix (Pap test).  For women ages 21-65, health care providers may recommend a pelvic exam and a Pap test every three years. For women ages 70-65, they may recommend the Pap test and pelvic exam, combined with testing for human papilloma virus (HPV), every five years. Some types of HPV increase your risk of cervical cancer. Testing for HPV may also be done on women of any age who have unclear Pap test results.  Other health care providers may not recommend any screening for nonpregnant women who are considered low risk for pelvic cancer and have no symptoms. Ask your health care provider if a screening pelvic exam is right for you.  If you have had past treatment  for cervical cancer or a condition that could lead to cancer, you need Pap tests and screening for cancer for at least 20 years after your treatment. If Pap tests have been discontinued for you, your risk factors (such as having a new sexual partner) need to be reassessed to determine if you should start having screenings again. Some women have medical problems that increase the chance of getting cervical cancer. In these cases, your health care provider may recommend that you have screening and Pap tests more often.  If you have a family history of uterine cancer or ovarian cancer, talk with your health care provider about genetic screening.  If you have vaginal bleeding after reaching menopause, tell your health care provider.  There are currently no reliable tests available  to screen for ovarian cancer.  Lung Cancer Lung cancer screening is recommended for adults 41-74 years old who are at high risk for lung cancer because of a history of smoking. A yearly low-dose CT scan of the lungs is recommended if you:  Currently smoke.  Have a history of at least 30 pack-years of smoking and you currently smoke or have quit within the past 15 years. A pack-year is smoking an average of one pack of cigarettes per day for one year.  Yearly screening should:  Continue until it has been 15 years since you quit.  Stop if you develop a health problem that would prevent you from having lung cancer treatment.  Colorectal Cancer  This type of cancer can be detected and can often be prevented.  Routine colorectal cancer screening usually begins at age 24 and continues through age 34.  If you have risk factors for colon cancer, your health care provider may recommend that you be screened at an earlier age.  If you have a family history of colorectal cancer, talk with your health care provider about genetic screening.  Your health care provider may also recommend using home test kits to check for hidden blood in your stool.  A small camera at the end of a tube can be used to examine your colon directly (sigmoidoscopy or colonoscopy). This is done to check for the earliest forms of colorectal cancer.  Direct examination of the colon should be repeated every 5-10 years until age 57. However, if early forms of precancerous polyps or small growths are found or if you have a family history or genetic risk for colorectal cancer, you may need to be screened more often.  Skin Cancer  Check your skin from head to toe regularly.  Monitor any moles. Be sure to tell your health care provider: ? About any new moles or changes in moles, especially if there is a change in a mole's shape or color. ? If you have a mole that is larger than the size of a pencil eraser.  If any of your  family members has a history of skin cancer, especially at a young age, talk with your health care provider about genetic screening.  Always use sunscreen. Apply sunscreen liberally and repeatedly throughout the day.  Whenever you are outside, protect yourself by wearing long sleeves, pants, a wide-brimmed hat, and sunglasses.  What should I know about osteoporosis? Osteoporosis is a condition in which bone destruction happens more quickly than new bone creation. After menopause, you may be at an increased risk for osteoporosis. To help prevent osteoporosis or the bone fractures that can happen because of osteoporosis, the following is recommended:  If you are 19-50  years old, get at least 1,000 mg of calcium and at least 600 mg of vitamin D per day.  If you are older than age 50 but younger than age 3, get at least 1,200 mg of calcium and at least 600 mg of vitamin D per day.  If you are older than age 40, get at least 1,200 mg of calcium and at least 800 mg of vitamin D per day.  Smoking and excessive alcohol intake increase the risk of osteoporosis. Eat foods that are rich in calcium and vitamin D, and do weight-bearing exercises several times each week as directed by your health care provider. What should I know about how menopause affects my mental health? Depression may occur at any age, but it is more common as you become older. Common symptoms of depression include:  Low or sad mood.  Changes in sleep patterns.  Changes in appetite or eating patterns.  Feeling an overall lack of motivation or enjoyment of activities that you previously enjoyed.  Frequent crying spells.  Talk with your health care provider if you think that you are experiencing depression. What should I know about immunizations? It is important that you get and maintain your immunizations. These include:  Tetanus, diphtheria, and pertussis (Tdap) booster vaccine.  Influenza every year before the flu season  begins.  Pneumonia vaccine.  Shingles vaccine.  Your health care provider may also recommend other immunizations. This information is not intended to replace advice given to you by your health care provider. Make sure you discuss any questions you have with your health care provider. Document Released: 02/28/2005 Document Revised: 07/27/2015 Document Reviewed: 10/10/2014 Elsevier Interactive Patient Education  2018 [place] in the Home Falls can cause injuries and can affect people from all age groups. There are many simple things that you can do to make your home safe and to help prevent falls. What can I do on the outside of my home?  Regularly repair the edges of walkways and driveways and fix any cracks.  Remove high doorway thresholds.  Trim any shrubbery on the main path into your home.  Use bright outdoor lighting.  Clear walkways of debris and clutter, including tools and rocks.  Regularly check that handrails are securely fastened and in good repair. Both sides of any steps should have handrails.  Install guardrails along the edges of any raised decks or porches.  Have leaves, snow, and ice cleared regularly.  Use sand or salt on walkways during winter months.  In the garage, clean up any spills right away, including grease or oil spills. What can I do in the bathroom?  Use night lights.  Install grab bars by the toilet and in the tub and shower. Do not use towel bars as grab bars.  Use non-skid mats or decals on the floor of the tub or shower.  If you need to sit down while you are in the shower, use a plastic, non-slip stool.  Keep the floor dry. Immediately clean up any water that spills on the floor.  Remove soap buildup in the tub or shower on a regular basis.  Attach bath mats securely with double-sided non-slip rug tape.  Remove throw rugs and other tripping hazards from the floor. What can I do in the bedroom?  Use night  lights.  Make sure that a bedside light is easy to reach.  Do not use oversized bedding that drapes onto the floor.  Have a firm chair  that has side arms to use for getting dressed.  Remove throw rugs and other tripping hazards from the floor. What can I do in the kitchen?  Clean up any spills right away.  Avoid walking on wet floors.  Place frequently used items in easy-to-reach places.  If you need to reach for something above you, use a sturdy step stool that has a grab bar.  Keep electrical cables out of the way.  Do not use floor polish or wax that makes floors slippery. If you have to use wax, make sure that it is non-skid floor wax.  Remove throw rugs and other tripping hazards from the floor. What can I do in the stairways?  Do not leave any items on the stairs.  Make sure that there are handrails on both sides of the stairs. Fix handrails that are broken or loose. Make sure that handrails are as long as the stairways.  Check any carpeting to make sure that it is firmly attached to the stairs. Fix any carpet that is loose or worn.  Avoid having throw rugs at the top or bottom of stairways, or secure the rugs with carpet tape to prevent them from moving.  Make sure that you have a light switch at the top of the stairs and the bottom of the stairs. If you do not have them, have them installed. What are some other fall prevention tips?  Wear closed-toe shoes that fit well and support your feet. Wear shoes that have rubber soles or low heels.  When you use a stepladder, make sure that it is completely opened and that the sides are firmly locked. Have someone hold the ladder while you are using it. Do not climb a closed stepladder.  Add color or contrast paint or tape to grab bars and handrails in your home. Place contrasting color strips on the first and last steps.  Use mobility aids as needed, such as canes, walkers, scooters, and crutches.  Turn on lights if it is  dark. Replace any light bulbs that burn out.  Set up furniture so that there are clear paths. Keep the furniture in the same spot.  Fix any uneven floor surfaces.  Choose a carpet design that does not hide the edge of steps of a stairway.  Be aware of any and all pets.  Review your medicines with your healthcare provider. Some medicines can cause dizziness or changes in blood pressure, which increase your risk of falling. Talk with your health care provider about other ways that you can decrease your risk of falls. This may include working with a physical therapist or trainer to improve your strength, balance, and endurance. This information is not intended to replace advice given to you by your health care provider. Make sure you discuss any questions you have with your health care provider. Document Released: 12/27/2001 Document Revised: 06/05/2015 Document Reviewed: 02/10/2014 Elsevier Interactive Patient Education  [employer].

## 2017-11-04 ENCOUNTER — Encounter: Payer: Medicare Other | Admitting: Family Medicine

## 2017-11-04 NOTE — Progress Notes (Signed)
I have reviewed documentation from this visit and I agree with recommendations given.  Darlynn Ricco G. Nolon Yellin, MD  [facility]. [place] office.   

## 2017-11-05 ENCOUNTER — Encounter: Payer: Self-pay | Admitting: Family Medicine

## 2017-12-11 ENCOUNTER — Ambulatory Visit: Payer: Medicare Other | Admitting: Neurology

## 2017-12-11 NOTE — Progress Notes (Signed)
Stephanie Shaw was seen today in the movement disorders clinic for neurologic consultation at the request of [name], [name], MD.  The consultation is for the evaluation of tremor.  The records that were made available to me were reviewed.   Tremor: Yes.     How long has it been going on? 1-2 years  At rest or with activation?  With activation  Fam hx of tremor?  Yes.    Located where?  "all over" - jaw, voice, left leg, both arms  Affected by caffeine:  No. (1 cup coffee per day)  Affected by alcohol:  Doesn't drink enough to know  Affected by stress:  unsure  Affected by fatigue: unsure  Spills soup if on spoon:  Will be okay if holds spoon tight  Spills glass of liquid if full:  No.  Affects ADL's (tying shoes, brushing teeth, etc):  Yes.  , but just flossing teeth  Other Specific Symptoms:  Voice: noted vocal tremor Sleep: "I'm sleeping all day and all night"  Vivid Dreams:  No.  Acting out dreams:  No. Wet Pillows: occasionally Postural symptoms:  Yes.  , some  Falls?  Yes.  , 2 in the last year but "little falls" - the prior year had fx both arms Bradykinesia symptoms: difficulty getting out of a chair and difficulty regaining balance Loss of smell:  No. Loss of taste:  No. Urinary Incontinence:  No. Difficulty Swallowing:  No. (rarely has trouble) Handwriting, micrographia: Yes.   Trouble buttoning clothing: Yes.   Depression:  Yes.  , just mild because just retired and was troublesome for her Memory changes:  No. Hallucinations:  No.  visual distortions: No. N/V:  No. Lightheaded:  No.  Syncope: No. Diplopia:  No. Dyskinesia:  No.  Neuroimaging has previously been performed.  Patient had a CT of the brain in April, 2017 after a fall.  I reviewed this.  It was unremarkable.  11/26/16 update: Patient is seen in today for follow-up of testing results.  She is accompanied by her daughter who supplements the history.  She had a DaTscan in September, 2018.  I have  reviewed those results.  DaT scan officially showed Poor uptake of dopamine transport specific radiotracer within the LEFT and RIGHT putamen and decrease relative uptake in the head of the RIGHT caudate nucleus.  When I looked at the scan myself, there was mostly decreased uptake on the right.  MRI of the brain was done September 05, 2016.  I personally reviewed this.  It was normal.  She has been attending therapies at the rehab center.  She has started [employer].  The records that were made available to me were reviewed.  She did have a fall since last visit.  She fell in the grass.  Someone helped her get up and she didn't get hurt.    03/06/17 update: Patient is seen today in follow-up.  She was started on carbidopa/levodopa 25/100 last visit and worked 1 tablet 3 times per day.  She reports that she is a little more stable.  She attended rehab therapy since last visit.  The records that were made available to me were reviewed. Reports being involved with [employer] spin class and [employer] classes.  Pt denies falls.  Pt denies lightheadedness, near syncope.  No hallucinations.  Mood has been good and she denies depression.  Doesn't want medication.  "I stay busy."  "My daughter thinks I am going to  be like I was 20 years ago."  Going to [facility] next month to visit a friend. Restarted singing in choir.    08/04/17 update:  Pt seen in f/u for parkinsonism.  She is on carbidopa/levodopa 25/100, 1 tablet 3 times per day(7am/11:30am/5:30pm).  Feels shakey about 1 hour before takes 11:30 dose but doesn't note it so much in afternoon.  Pt denies falls but feels like balance is poor.  Pt denies lightheadedness, near syncope.  No hallucinations.  Mood has been good.  Last visit, her blood pressure was quite low.  She was on Cardizem.  Wanted her to address with cardiology.  I did review cardiology notes.  She does have persistent atrial fibrillation.  It does not appear her blood pressure was addressed.  She is in the  process of moving and is under a lot of stress as is selling her current home.  She is moving to [facility].  She is doing spin class for exercise 3 days per week.  "It really helps."  Is tired a lot  12/14/17 update:  Pt seen in f/u for parkinsonism.  Her daughter accompanies her and supplements the history.  She is on carbidopa/levodopa 25/100, which was increased last visit to 1 tablet 4 times per day (8am/11am/2pm/5pm).  It lasts until the next dose but once it is time to dose, she will have tremor.  She is doing [employer] moves and [employer] cycle class.  Pt denies falls.  Pt denies lightheadedness, near syncope.  No hallucinations.  Mood has been good but she is "worried."    The records that were made available to me were reviewed. Complains of R shoulder pain.  States that back in May she had an injection with Dr. Irene Shipper.  PREVIOUS MEDICATIONS: none to date  ALLERGIES:   Allergies  Allergen Reactions  . Doxycycline Nausea And Vomiting  . Sulfonamide Derivatives     REACTION: HIVES    CURRENT MEDICATIONS:  Outpatient Encounter Medications as of 12/14/2017  Medication Sig  . carbidopa-levodopa (SINEMET IR) 25-100 MG tablet Take 1 tablet by mouth 4 (four) times daily.  . cholecalciferol (VITAMIN D) 400 units TABS tablet Take 400 Units by mouth daily.  Marland Kitchen diltiazem (CARDIZEM CD) 180 MG 24 hr capsule TAKE ONE CAPSULE EVERY DAY  . estradiol (ESTRACE VAGINAL) 0.1 MG/GM vaginal cream INSERT 1/4 OF APPLICATORFUL VAGINALLY TWICE PER WEEK  . ferrous sulfate 325 (65 FE) MG EC tablet Take 325 mg by mouth 2 (two) times a week.   . Flaxseed, Linseed, (FLAX SEED OIL) 1000 MG CAPS Take 1 capsule by mouth daily.   Alveda Reasons 20 MG TABS tablet TAKE 1 TABLET BY MOUTH ONCE DAILY   No facility-administered encounter medications on file as of 12/14/2017.     PAST MEDICAL HISTORY:   Past Medical History:  Diagnosis Date  . Colles' fracture of right radius 03/05/2015  . DEGENERATIVE JOINT DISEASE, RIGHT HIP  02/16/2007  . Dupuytren's contracture   . Osteoarthritis of hip    right  . Osteoarthritis of left knee   . POSTMENOPAUSAL SYNDROME 02/16/2007  . PREMATURE ATRIAL CONTRACTIONS 02/16/2007  . S/P breast biopsy, left    two o'clock position - benign  . 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 Venous insufficiency     PAST SURGICAL HISTORY:   Past Surgical History:  Procedure Laterality Date  . BREAST BIOPSY Left   . CATARACT EXTRACTION, BILATERAL      SOCIAL HISTORY:   Social History  Socioeconomic History  . Marital status: Divorced    Spouse name: Not on file  . Number of children: Not on file  . Years of education: Not on file  . Highest education level: Not on file  Occupational History  . Occupation: retired    Comment: math, [employer]  Social Needs  . Financial resource strain: Not on file  . Food insecurity:    Worry: Not on file    Inability: Not on file  . Transportation needs:    Medical: Not on file    Non-medical: Not on file  Tobacco Use  . Smoking status: Former Smoker    Last attempt to quit: 08/27/1971    Years since quitting: 46.3  . Smokeless tobacco: Never Used  Substance and Sexual Activity  . Alcohol use: No    Alcohol/week: 0.0 standard drinks    Comment: hardly ever  . Drug use: No  . Sexual activity: Not on file  Lifestyle  . Physical activity:    Days per week: Not on file    Minutes per session: Not on file  . Stress: Not on file  Relationships  . Social connections:    Talks on phone: Not on file    Gets together: Not on file    Attends religious service: Not on file    Active member of club or organization: Not on file    Attends meetings of clubs or organizations: Not on file    Relationship status: Not on file  . Intimate partner violence:    Fear of current or ex partner: Not on file    Emotionally abused: Not on file    Physically abused: Not on file    Forced sexual activity: Not on file  Other Topics Concern  . Not on file    Social History Narrative  . Not on file    FAMILY HISTORY:   Family Status  Relation Name Status  . Father  Deceased  . Mother  Deceased  . Sister  Alive  . Sister  Alive  . Child 2 Alive  . Neg Hx  (Not Specified)    ROS:  Review of Systems  Constitutional: Negative.   HENT: Negative.   Eyes: Negative.   Respiratory: Negative.   Cardiovascular: Negative.   Gastrointestinal: Negative.   Musculoskeletal: Negative.   Skin: Negative.   Endo/Heme/Allergies: Negative.      PHYSICAL EXAMINATION:    VITALS:   Vitals:   12/14/17 0839  BP: 120/74  Pulse: 84  SpO2: 97%  Weight: 138 lb (62.6 kg)  Height: 5\' 8"  (1.727 m)   GEN:  The patient appears stated age and is in NAD. HEENT:  Normocephalic, atraumatic.  The mucous membranes are moist. The superficial temporal arteries are without ropiness or tenderness. CV:  Irreg irreg Lungs:  CTAB Neck/HEME:  There are no carotid bruits bilaterally.  Neurological examination:  Orientation: The patient is alert and oriented x3. Cranial nerves: There is good facial symmetry. The speech is fluent and clear. Soft palate rises symmetrically and there is no tongue deviation. Hearing is intact to conversational tone. Sensation: Sensation is intact to light touch throughout Motor: Strength is 5/5 in the bilateral upper and lower extremities.   Shoulder shrug is equal and symmetric.  There is no pronator drift.   Movement examination: Tone: There is mild increased tone in the LUE (same as last visit) Abnormal movements: there is no tremor today Coordination:  There is decremation, with any form  of RAMS, including alternating supination and pronation of the forearm, hand opening and closing, finger taps, heel taps and toe taps on the L Gait and Station: The patient arises without the use of her hands on the second attempt.  The left arm is held in a flexed posture.  She is not shuffling.   Labs:  Lab Results  Component Value Date    VITAMINB12 1,238 (H) 08/19/2016     Chemistry      Component Value Date/Time   NA 140 11/03/2017 0849   NA 142 06/02/2017 1630   K 4.2 11/03/2017 0849   CL 104 11/03/2017 0849   CO2 30 11/03/2017 0849   BUN 17 11/03/2017 0849   BUN 16 06/02/2017 1630   CREATININE 0.73 11/03/2017 0849   CREATININE 0.92 06/14/2015 0853      Component Value Date/Time   CALCIUM 9.6 11/03/2017 0849   ALKPHOS 46 08/19/2016 1029   AST 21 08/19/2016 1029   ALT 15 08/19/2016 1029   BILITOT 0.6 08/19/2016 1029     Lab Results  Component Value Date   TSH 1.00 08/19/2016      ASSESSMENT/PLAN:  1.  Parkinsons disease  -DaTscan was positive in September, 2018  -Continue carbidopa/levodopa 25/100, 1 tablet 4 times per day.  She can take an extra while traveling.  -Daughter asked several questions and I answered them to the best my ability.  Patient education was provided.  2.  Mild anxiety and depression  -she denies any depression but still has anxiety.    3.   R shoulder pain  -told her she needed to get back to Dr. Irene Shipper  4.  Fatigue  -not sure that related to PD.  Is on cardizem but needs it for rate control.  May be able to lower dose but is to f/u with cardiology.    -Daughter asked about managing with amphetamines.  Explained that I do not use this in my Parkinson's population.  Sometimes, I do selegiline, but not while there are other factors that potentially contribute to fatigue.  Talked about lifestyle modification.  5.  Follow up is anticipated in the next few months, sooner should new neurologic issues arise.  Much greater than 50% of this visit was spent in counseling and coordinating care.  Total face to face time:  25 min     Cc:  [name], [name], MD

## 2017-12-14 ENCOUNTER — Ambulatory Visit: Payer: Medicare Other | Admitting: Neurology

## 2017-12-14 ENCOUNTER — Encounter: Payer: Self-pay | Admitting: Neurology

## 2017-12-14 VITALS — BP 120/74 | HR 84 | Ht 68.0 in | Wt 138.0 lb

## 2017-12-14 DIAGNOSIS — G20 Parkinson's disease: Secondary | ICD-10-CM

## 2017-12-14 DIAGNOSIS — G8929 Other chronic pain: Secondary | ICD-10-CM | POA: Diagnosis not present

## 2017-12-14 DIAGNOSIS — R5383 Other fatigue: Secondary | ICD-10-CM

## 2017-12-14 DIAGNOSIS — M25511 Pain in right shoulder: Secondary | ICD-10-CM | POA: Diagnosis not present

## 2017-12-14 NOTE — Patient Instructions (Signed)
1.  Follow up with Dr. Irene Shipper 2.  Follow up with cardiology regarding if it is possible to lower cardizem dosage

## 2017-12-22 ENCOUNTER — Ambulatory Visit: Payer: Self-pay

## 2017-12-22 ENCOUNTER — Ambulatory Visit: Payer: Medicare Other | Admitting: Sports Medicine

## 2017-12-22 ENCOUNTER — Encounter: Payer: Self-pay | Admitting: Sports Medicine

## 2017-12-22 VITALS — BP 92/60 | HR 97 | Ht 67.0 in | Wt 140.2 lb

## 2017-12-22 DIAGNOSIS — M19011 Primary osteoarthritis, right shoulder: Secondary | ICD-10-CM | POA: Diagnosis not present

## 2017-12-22 DIAGNOSIS — M25511 Pain in right shoulder: Secondary | ICD-10-CM

## 2017-12-22 NOTE — Patient Instructions (Addendum)

## 2017-12-22 NOTE — Progress Notes (Signed)
Stephanie Shaw. Stephanie Shaw, [name] at [place]  Stephanie Shaw - 77 y.o. female MRN [id number]  Date of birth: 06-Nov-1940  Visit Date: 12/22/2017  PCP: [name], [name], MD   Referred by: [name], [name], MD   SUBJECTIVE:  Stephanie Shaw is here for Follow-up (R shoulder pain)  HPI: Patient presents with bilateral right worse than left shoulder pain over the past 3 months.  She has previously undergone an injection that was beneficial and now her left shoulder pain is becoming more painful.  Pain radiates from her deltoid into her proximal humerus.  She had a proximal humeral fracture in 2017 and treated with immobilization in a sling.  She has pain radiates from her bilateral shoulders into the bilateral elbows but this is mild.  Pain is worse with overhead range of motion and external rotation.  Overhead weightbearing seems to make it worse as well as with planking activities.  Tylenol and Voltaren gel which is not covered by insurance has been mildly helpful.  REVIEW OF SYSTEMS: Reports night time disturbances. Denies fevers, chills, or night sweats. Denies unexplained weight loss. Denies personal history of cancer. Denies changes in bowel or bladder habits. Denies recent unreported falls. Denies new or worsening dyspnea or wheezing. Denies headaches or dizziness.  Denies numbness, tingling or weakness  In the extremities.  Denies dizziness or presyncopal episodes Denies lower extremity edema    HISTORY:  Prior history reviewed and updated per electronic medical record.  Social History  Occupational History  . Occupation: retired    Comment: math, [employer]  Tobacco Use  . Smoking status: Former Smoker    Last attempt to quit: 08/27/1971    Years since quitting: 46.3  . Smokeless tobacco: Never Used  Substance and Sexual Activity  . Alcohol use: No    Alcohol/week: 0.0 standard drinks    Comment: hardly ever  . Drug use:  No  . Sexual activity: Not on file   Social History  Social History Narrative  . Not on file     OBJECTIVE:  VS:  HT:5\' 7"  (170.2 cm)   WT:140 lb 3.2 oz (63.6 kg)  BMI:21.95    BP:92/60  HR:97bpm  TEMP: ( )  RESP:96 %   PHYSICAL EXAM: CONSTITUTIONAL: Well-developed, Well-nourished and In no acute distress PSYCHIATRIC: Alert & appropriately interactive. and Not depressed or anxious appearing. RESPIRATORY: No increased work of breathing and Trachea Midline EYES: Pupils are equal., EOM intact without nystagmus. and No scleral icterus.  VASCULAR EXAM: Warm and well perfused NEURO: unremarkable  MSK Exam:  Bilateral shoulders, overall well aligned without significant deformity.  She has pain with Luan Pulling and Neer's discretion of the right compared to left.  She has well-maintained strength with internal and external rotation.  She does have a generalized weakness in her upper extremities as well as small amount of atrophy. ASSESSMENT  1. Right shoulder pain, unspecified chronicity   2. Primary osteoarthritis of right shoulder     PLAN:  Pertinent additional documentation may be included in corresponding procedure notes, imaging studies, problem based documentation and patient instructions.  Procedures:  US Guided Injection per procedure note    Medications:  No orders of the defined types were placed in this encounter. Discussion/Instructions: No problem-specific Assessment & Plan notes found for this encounter. . She has a small amount of subacromial bursitis and this was injected today.  She has responded well to this in the  past.  I would like for her to continue with home therapeutic exercises as previously prescribed and avoid exacerbating activities. .   . Discussed red flag symptoms that warrant earlier emergent evaluation and patient voices understanding. . Activity modifications and the importance of avoiding exacerbating activities (limiting pain to no more than  a 4 / 10 during or following activity) recommended and discussed.  Follow-up:  . Return in about 8 weeks (around 02/16/2018).          Gerda Diss, [facility] Physician

## 2017-12-22 NOTE — Procedures (Signed)
PROCEDURE NOTE:  Ultrasound Guided: Injection: Right shoulder Images were obtained and interpreted by myself, Teresa Coombs, DO  Images have been saved and stored to PACS system. Images obtained on: GE S7 Ultrasound machine    ULTRASOUND FINDINGS:  Small subacromial bursa Thickening of the supraspinatus tendon without overt tearing.  DESCRIPTION OF PROCEDURE:  The patient's clinical condition is marked by substantial pain and/or significant functional disability. Other conservative therapy has not provided relief, is contraindicated, or not appropriate. There is a reasonable likelihood that injection will significantly improve the patient's pain and/or functional impairment.   After discussing the risks, benefits and expected outcomes of the injection and all questions were reviewed and answered, the patient wished to undergo the above named procedure.  Verbal consent was obtained.  The ultrasound was used to identify the target structure and adjacent neurovascular structures. The skin was then prepped in sterile fashion and the target structure was injected under direct visualization using sterile technique as below:  Single injection performed as below: PREP: Alcohol and Ethel Chloride APPROACH:direct, single injection, 25g 1.5 in. INJECTATE: 2 cc 0.5% Marcaine and 2 cc 40mg /mL DepoMedrol ASPIRATE: None DRESSING: Band-Aid  Post procedural instructions including recommending icing and warning signs for infection were reviewed.    This procedure was well tolerated and there were no complications.   IMPRESSION: Succesful Ultrasound Guided: Injection

## 2017-12-29 MED FILL — XARELTO 20 MG TABLET: 20 | 90 days supply | Qty: 90 | Fill #2

## 2018-01-27 ENCOUNTER — Telehealth: Payer: Self-pay | Admitting: Neurology

## 2018-01-27 NOTE — Telephone Encounter (Signed)
Spoke with patient. She wanted to know if the extra tablet she could take of Levodopa was once daily or with each dose (discussed at last appointment). I made her aware that this is once daily. She will try an extra tablet (or possibility split in half and take 1/2 twice daily) and let us know how she does.

## 2018-01-27 NOTE — Telephone Encounter (Signed)
Patient left vm about having questions concerning her medication. She did not give name of meds. Please call her back at [phone]. Thanks!

## 2018-02-16 ENCOUNTER — Encounter: Payer: Self-pay | Admitting: Sports Medicine

## 2018-02-16 ENCOUNTER — Ambulatory Visit: Payer: Medicare Other | Admitting: Sports Medicine

## 2018-02-16 VITALS — BP 100/68 | HR 91 | Ht 67.0 in | Wt 140.0 lb

## 2018-02-16 DIAGNOSIS — G20 Parkinson's disease: Secondary | ICD-10-CM | POA: Diagnosis not present

## 2018-02-16 DIAGNOSIS — M19011 Primary osteoarthritis, right shoulder: Secondary | ICD-10-CM | POA: Diagnosis not present

## 2018-02-21 NOTE — Progress Notes (Signed)
Juanda Bond. Yaslene Lindamood, [place] at [place]  JOHNAY MANO - 78 y.o. female MRN [id number]  Date of birth: [birth year]  Visit Date: 01/28/2020February 2, 2020  PCP: [name], [name], MD   Referred by: [name], [name], MD  SUBJECTIVE:   Chief Complaint  Patient presents with  . Follow-up    Chronic R shoulder pain.  Injections on 12/22/17 and 04/27/17.  Tylenol.    HPI: Patient reports that her right shoulder pain continues to be slightly painful for her but overall improved compared to in the past.  Is been increasingly difficult for her to pull up her covers at nighttime.  She has responded well to the prior injections with her most recent one on 12/22/2017.  She has continued to have good relief since this.  She does report having a fall while getting out of bed on 01/25/2018 without significant ongoing issue since that time.  She does get chronic dizziness and presyncopal episodes associated with her Parkinson's disease.  REVIEW OF SYSTEMS: Per HPI  Otherwise 12 point review of systems performed and is negative   HISTORY:  Prior history reviewed and updated per electronic medical record.  Patient Active Problem List   Diagnosis Date Noted  . 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 Unstable gait 02/16/2017  . Parkinson's disease ([place]) 11/26/2016  . Parkinsonism ([place]) 10/17/2016  . Fatigue 10/17/2016  . Long term (current) use of anticoagulants 05/29/2016  . Left wrist pain 01/03/2016  . Left hand pain 01/03/2016  . Vaginal atrophy 10/19/2015  . Tremor, unspecified 10/19/2015  . Osteopenia 10/19/2015    FRAX score hip 4.7%   . 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 Closed fracture of part of upper end of humerus 05/01/2015  . Hematuria 02/04/2014  . 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 Cervical pain (neck) 06/20/2010  . Atrial fibrillation ([place]) 05/21/2010  . Menopausal and  postmenopausal disorder 02/16/2007  . DEGENERATIVE JOINT DISEASE, RIGHT HIP 02/16/2007   Social History   Occupational History  . Occupation: retired    Comment: math, [employer]  Tobacco Use  . Smoking status: Former Smoker    Last attempt to quit: 08/27/1971    Years since quitting: 46.5  . Smokeless tobacco: Never Used  Substance and Sexual Activity  . Alcohol use: No    Alcohol/week: 0.0 standard drinks    Comment: hardly ever  . Drug use: No  . Sexual activity: Not on file   Social History   Social History Narrative  . Not on file    OBJECTIVE:  VS:  HT:5\' 7"  (170.2 cm)   WT:140 lb (63.5 kg)  BMI:21.92    BP:100/68  HR:91bpm  TEMP: ( )  RESP:98 %   PHYSICAL EXAM: Adult female. No acute distress.  Alert and appropriate. Right shoulder has improved overhead range of motion compared to prior visits but continues to have limited terminal flexion and abduction.  She has pain with axial load and circumduction. She has an unsteady gait and sit to stand test is 4 to 5 seconds.    ASSESSMENT:   1. Primary osteoarthritis of right shoulder   2. Parkinson's disease ([place])     PROCEDURES:  None  PLAN:  Pertinent additional documentation may be included in corresponding procedure notes, imaging studies, problem based documentation and patient instructions.  No problem-specific Assessment & Plan notes found for this encounter.   Her shoulder continues to  be fairly significantly limiting for her but at this time is better than prior to her last injection.  She would like to hold off on repeating this injection at this time but would like close follow-up in 8 weeks.  If any persistent or ongoing symptoms could consider referral for total shoulder arthroplasty however given her significant limitations with her Parkinson's the rehab from this would be incredibly difficult.  We discussed that repeating injections as frequently as 10 to 12 weeks is possible but at this time will  defer any further intervention.  She is not a candidate for nitroglycerin protocol given the orthostatic complaints she has with her Parkinson's.  Continue previously prescribed home exercise program.    Activity modifications and the importance of avoiding exacerbating activities (limiting pain to no more than a 4 / 10 during or following activity) recommended and discussed.  Discussed red flag symptoms that warrant earlier emergent evaluation and patient voices understanding.  Return in about 8 weeks (around 04/13/2018).          Gerda Diss, [place] [facility] Physician

## 2018-02-23 ENCOUNTER — Encounter: Payer: Self-pay | Admitting: Sports Medicine

## 2018-02-26 ENCOUNTER — Other Ambulatory Visit: Payer: Self-pay | Admitting: Neurology

## 2018-03-12 ENCOUNTER — Other Ambulatory Visit: Payer: Self-pay | Admitting: *Deleted

## 2018-03-12 MED ORDER — DILTIAZEM HCL ER COATED BEADS 180 MG PO CP24: 180.0000 mg | ORAL_CAPSULE | Freq: Every day | ORAL | 3 refills | Status: DC

## 2018-03-16 ENCOUNTER — Ambulatory Visit: Payer: Medicare Other | Attending: Family Medicine | Admitting: Physical Therapy

## 2018-03-16 ENCOUNTER — Ambulatory Visit: Payer: Medicare Other | Admitting: Occupational Therapy

## 2018-03-16 ENCOUNTER — Ambulatory Visit: Payer: Medicare Other | Admitting: Speech Pathology

## 2018-03-16 ENCOUNTER — Telehealth: Payer: Self-pay | Admitting: Physical Therapy

## 2018-03-16 ENCOUNTER — Encounter: Payer: Self-pay | Admitting: Occupational Therapy

## 2018-03-16 DIAGNOSIS — R471 Dysarthria and anarthria: Secondary | ICD-10-CM

## 2018-03-16 DIAGNOSIS — R293 Abnormal posture: Secondary | ICD-10-CM | POA: Insufficient documentation

## 2018-03-16 DIAGNOSIS — R278 Other lack of coordination: Secondary | ICD-10-CM

## 2018-03-16 DIAGNOSIS — R29898 Other symptoms and signs involving the musculoskeletal system: Secondary | ICD-10-CM

## 2018-03-16 DIAGNOSIS — G20 Parkinson's disease: Secondary | ICD-10-CM

## 2018-03-16 NOTE — Telephone Encounter (Signed)
Order entered

## 2018-03-16 NOTE — Therapy (Signed)
[facility] [address] [place], [place], [address] Phone: [phone]   Fax:  [phone]  Patient Details  Name: Stephanie Shaw MRN: [id number] Date of Birth: 06-21-1940 Referring Provider:  [name], [name], MD  Encounter Date: 03/16/2018   Occupational Therapy Parkinson's Disease Screen  Hand dominance:  right   Physical Performance Test item #4 (donning/doffing jacket):  58.09 sec  9-hole peg test:    RUE  32.97 sec        LUE  34.78 sec  Other Comments:  Bilateral shoulder pain and decr ROM, observed decline in posture   Pt would benefit from occupational therapy evaluation due to  incr bradykinesia with ADLs, bilateral shoulder pain and decr ROM affecting functional use, decr coordination.   [facility] 03/16/2018, 1:36 PM  [facility] [address] [place], [place], [address] Phone: [phone]   Fax:  [place], OTR/L [facility] [address] [place], [place]  [id number] [phone] phone [phone] 03/16/18 1:36 PM

## 2018-03-16 NOTE — Therapy (Signed)
[facility] [address] [place], [place], [address] Phone: [phone]   Fax:  [phone]  Patient Details  Name: Stephanie Shaw MRN: [id number] Date of Birth: 11-Apr-1940 Referring Provider:  [name], [name], MD  Encounter Date: 03/16/2018  Physical Therapy Parkinson's Disease Screen   Timed Up and Go test: 20 sec  (compared to 15.15 sec)  10 meter walk test: 10.28 sec = 3.19 ft/sec  (compared to 3.64 ft/sec)  5 time sit to stand test:  19.69 sec   (compared to 16.68 sec)  Patient would benefit from Physical Therapy evaluation due to slowed mobility measures compared to screens July 2019.      Adeeb Konecny W. 03/16/2018, 1:32 PM Frazier Butt., PT [facility] [address] [place], [place], [address] Phone: [phone]   Fax:  [phone]

## 2018-03-16 NOTE — Telephone Encounter (Signed)
Stephanie Shaw was seen for PD screens today in our clinic.  PT recommends evaluation due to slowed mobility measures and pt reported balance issues.  OT recommends evaluation due to pain in shoulder and difficulty with ADLs.  If you agree, could you please see order for PT and OT evaluate and treat?  Thank you so much.    Mady Haagensen, PT

## 2018-03-16 NOTE — Therapy (Signed)
[facility] [address] [place], [place], [address] Phone: [phone]   Fax:  [phone]  Patient Details  Name: Stephanie Shaw MRN: [id number] Date of Birth: 14-Sep-1940 Referring Provider:  [name], [name], MD  Encounter Date: 03/16/2018   Speech Therapy Parkinson's Disease Screen   Decibel Level today: 70 to 72dB  (WNL=70-72 dB) with sound level meter 30cm away from pt's mouth. Pt's conversational volume has remained the same since last treatment course. Pt denies difficulty communicating and does use her voice regularly singing in choirs.   Pt has not experienced difficulty in swallowing warranting objective evaluation   Pt does does not require speech therapy services at this time. Recommend ST screen in another 6 months    Lovvorn, Annye Rusk MS, CCC-SLP 03/16/2018, 1:51 PM  [facility] [address] [place], [place], [address] Phone: [phone]   Fax:  [phone]

## 2018-03-31 ENCOUNTER — Telehealth: Payer: Self-pay | Admitting: Neurology

## 2018-03-31 NOTE — Telephone Encounter (Signed)
Fax number is [phone] or [phone].

## 2018-03-31 NOTE — Telephone Encounter (Signed)
Spoke with patient and made her aware okay to send order to them.  Order printed and signed by Dr. Carles Collet. Called [phone] ([place]) [facility] for them to call me back with a fax number to send the other. Awaiting call back.

## 2018-03-31 NOTE — Telephone Encounter (Signed)
Referral faxed to both numbers with confirmation received.

## 2018-03-31 NOTE — Telephone Encounter (Signed)
Patient wanting to know if she can do the neuro rehab at [facility] instead of the ones scheduled. Please call her back [phone]. Thanks!

## 2018-04-06 ENCOUNTER — Ambulatory Visit: Payer: Medicare Other | Admitting: Occupational Therapy

## 2018-04-08 ENCOUNTER — Telehealth: Payer: Self-pay | Admitting: Cardiovascular Disease

## 2018-04-08 ENCOUNTER — Other Ambulatory Visit: Payer: Self-pay | Admitting: *Deleted

## 2018-04-08 MED ORDER — RIVAROXABAN 20 MG PO TABS: 20.0000 mg | ORAL_TABLET | Freq: Every day | ORAL | 1 refills | Status: DC

## 2018-04-08 NOTE — Telephone Encounter (Signed)
Pt last saw Dr Acie Fredrickson 06/02/17, last labs 11/03/17 Creat 0.73, age 78, weight 63.5kg, CrCl 63.67, based on CrCl pt is on appropriate dosage of Xarelto 20mg  QD.

## 2018-04-08 NOTE — Telephone Encounter (Signed)
Pt c/o swelling: STAT is pt has developed SOB within 24 hours  1) How much weight have you gained and in what time span?   2) If swelling, where is the swelling located? Feet, legs and ankles  3) Are you currently taking a fluid pill?   4) Are you currently SOB? no  5) Do you have a log of your daily weights (if so, list)? no  6) Have you gained 3 pounds in a day or 5 pounds in a week? Not sure   7) Have you traveled recently? No  Pt says swelling has been happening slowly. Over time her shoes and socks have been getting tighter. Not sure if one of the medications the pt is taking is a fluid pill or not.

## 2018-04-08 NOTE — Telephone Encounter (Signed)
Spoke with patient who states she has noticed lower extremity swelling over the past few months. She walks almost every day for exercise without difficulty. She lives at [facility] and is uncertain about sodium content. She admits to eating canned soup occasionally for dinner. Denies SOB, orthopnea. States swelling is worse in the morning but may be related to eating the soup at night. She agrees to weigh herself daily for the next few days and to stop the soup. I advised her I will call her back next week to see how she is doing. She verbalized understanding and agreement and thanked me for the call.

## 2018-04-12 ENCOUNTER — Ambulatory Visit: Payer: Medicare Other | Admitting: Physical Therapy

## 2018-04-13 ENCOUNTER — Ambulatory Visit: Payer: Medicare Other | Admitting: Sports Medicine

## 2018-04-14 NOTE — Telephone Encounter (Signed)
Left message for patient that I am calling to find out how she is feeling. I advised that I will be out of the office tomorrow but asked her to call back Friday to let me know how about her symptoms.

## 2018-04-16 NOTE — Telephone Encounter (Signed)
Pt called returning your call to discuss her symptoms

## 2018-04-19 ENCOUNTER — Telehealth: Payer: Self-pay | Admitting: Cardiovascular Disease

## 2018-04-19 NOTE — Telephone Encounter (Signed)
Left message that I was returning patient's call. Will try to call her again later.

## 2018-04-19 NOTE — Telephone Encounter (Signed)
See new telephone encounter dated 3/30

## 2018-04-19 NOTE — Telephone Encounter (Signed)
New message   Pt c/o swelling: STAT is pt has developed SOB within 24 hours  1) How much weight have you gained and in what time span? No significant weight gain   2) If swelling, where is the swelling located? Feet and ankles   3) Are you currently taking a fluid pill? No   4) Are you currently SOB? No   5) Do you have a log of your daily weights (if so, list)? 136, 136.2,136.8,136.8,137.2  6) Have you gained 3 pounds in a day or 5 pounds in a week? NO   7) Have you traveled recently? NO

## 2018-04-19 NOTE — Telephone Encounter (Signed)
Spoke with patient who states she has bilateral foot swelling with left foot worse than right. Her weight has increased 1 lb 2 oz since I talked to her last week. States she has reduced sodium as much as possible but still eats from dining room at [facility] She has stopped eating canned soup. Denies SOB, changes in her chronic atrial fib, or other concerns. States she walks regularly for exercise. I asked if she has taken Lasix in the past and she does not recall that she has ever taken it. Last echocardiogram was in 2012. I advised that I will forward message to Dr. Acie Fredrickson for advice and call her back tomorrow. She verbalized understanding and agreement with plan and thanked me for the call.

## 2018-04-20 MED ORDER — FUROSEMIDE 20 MG PO TABS: 20.0000 mg | ORAL_TABLET | Freq: Every day | ORAL | 11 refills | Status: DC

## 2018-04-20 MED ORDER — POTASSIUM CHLORIDE ER 10 MEQ PO TBCR: 10.0000 meq | EXTENDED_RELEASE_TABLET | Freq: Every day | ORAL | 11 refills | Status: DC

## 2018-04-20 NOTE — Telephone Encounter (Signed)
She may have developed some diastolic dysfuction  Lets start Lasix 20 mg a day and Kdur 10 meq a day . Have her take for 3 days and then reassess.  Will have her come in in a month or for echo and BMP

## 2018-04-20 NOTE — Telephone Encounter (Signed)
Reviewed Dr. Elmarie Shiley advice with patient. She states she is able to pick up the prescriptions and will start today. I advised her to take lasix and kdur for today, tomorrow, and Thursday and I will call back Thursday afternoon or Friday morning to see how she is feeling. I advised her to call back prior to my call with questions or concerns. She verbalized understanding and agreement with plan and thanked me for the call.

## 2018-04-23 NOTE — Telephone Encounter (Signed)
Left message for patient that I am calling to assess her symptoms since being on lasix and advised I will call back later.

## 2018-04-26 NOTE — Telephone Encounter (Signed)
Follow up    Patient is calling to advise how she is doing on the Lasix. She states that she is not noticing any difference. She says her weight is relatively the same 137 down to 136.8. Her ankles and feet are still swollen.

## 2018-04-27 ENCOUNTER — Telehealth: Payer: Self-pay | Admitting: Neurology

## 2018-04-27 ENCOUNTER — Other Ambulatory Visit: Payer: Self-pay | Admitting: *Deleted

## 2018-04-27 ENCOUNTER — Telehealth: Payer: Self-pay | Admitting: Cardiovascular Disease

## 2018-04-27 MED ORDER — CARBIDOPA-LEVODOPA 25-100 MG PO TABS: ORAL_TABLET | ORAL | 1 refills | Status: DC

## 2018-04-27 NOTE — Telephone Encounter (Signed)
Rx sent in

## 2018-04-27 NOTE — Telephone Encounter (Signed)
Patient needs to have a RX sent for the carbidopa levodopa she states that we up the dosage to 5 a day and needs RX to show that called into the [facility] on [address]

## 2018-04-27 NOTE — Telephone Encounter (Signed)
Spoke with patient. Confirmed all demographics and e-mail. Actively uses MyChart.

## 2018-04-27 NOTE — Telephone Encounter (Signed)
Pt called back. She says she missed a call on her phone, and there was not a message. She said it was our office that called. She spoke with Sharyn Lull earlier, and thought maybe she was calling back

## 2018-04-27 NOTE — Telephone Encounter (Signed)
Spoke with patient who reports a slight decrease in weight and bilateral lower extremity swelling since starting lasix 20 mg daily. She continues to take the lasix and the potassium daily. States she has not weighed yet today and she keeps hoping for a more noticeable decrease. I advised that since she is due for her one year follow-up that we will schedule a virtual visit on 4/13 so that Dr. Acie Fredrickson can see her leg edema. She verbalized understanding and agreement and thanked me for the call.   YOUR CARDIOLOGY TEAM HAS ARRANGED FOR AN E-VISIT FOR YOUR APPOINTMENT - PLEASE REVIEW IMPORTANT INFORMATION BELOW SEVERAL DAYS PRIOR TO YOUR APPOINTMENT  Due to the recent COVID-19 pandemic, we are transitioning in-person office visits to tele-medicine visits in an effort to decrease unnecessary exposure to our patients and staff. Medicare and most insurances are covering these visits without a copay needed. We also encourage you to sign up for MyChart if you have not already done so. You will need a smartphone if possible. For patients that do not have this, we can still complete the visit using a regular telephone but do prefer a smartphone to enable video when possible. You may have a close family member that lives with you that can help. If possible, we also ask that you have a blood pressure cuff and scale at home to measure your blood pressure, heart rate and weight prior to your scheduled appointment. Patients with clinical needs that need an in-person evaluation and testing will still be able to come to the office if absolutely necessary. If you have any questions, feel free to call our office.    IF YOU HAVE A SMARTPHONE, PLEASE DOWNLOAD THE WEBEX APP TO YOUR SMARTPHONE  - If Apple, go to [employer] and type in WebEx in the search bar. [place] [employer], the blue/green circle. The app is free but as with any other app download, your phone may require you to verify saved payment information or  Apple password. You do NOT have to create a WebEx account.  - If Android, go to [employer] and type in [employer] in the search bar. [place] [employer], the blue/green circle. The app is free but as with any other app download, your phone may require you to verify saved payment information or Android password. You do NOT have to create a WebEx account.  It is very helpful to have this downloaded before your visit.    2-3 DAYS BEFORE YOUR APPOINTMENT  You will receive a telephone call from one of our [place] [facility] team members - your caller ID may say "Unknown caller." If this is a video visit, we will confirm that you have been able to download the WebEx app. We will remind you check your blood pressure, heart rate and weight prior to your scheduled appointment. If you have an Apple Watch or Kardia, please upload any pertinent ECG strips the day before or morning of your appointment to [facility]. Our staff will also make sure you have reviewed the consent and agree to move forward with your scheduled tele-health visit.     THE DAY OF YOUR APPOINTMENT  Approximately 15 minutes prior to your scheduled appointment, you will receive a telephone call from one of [facility] team - your caller ID may say "Unknown caller."  Our staff will confirm medications, vital signs for the day and any symptoms you may be experiencing. Please have this information available prior to the time of visit  start. It may also be helpful for you to have a pad of paper and pen handy for any instructions given during your visit. They will also walk you through joining the WebEx smartphone meeting if this is a video visit.    CONSENT FOR TELE-HEALTH VISIT - PLEASE REVIEW  I hereby voluntarily request, consent and authorize [facility] and its employed or contracted physicians, physician assistants, nurse practitioners or other licensed health care professionals (the Practitioner), to provide me with telemedicine  health care services (the "Services") as deemed necessary by the treating Practitioner. I acknowledge and consent to receive the Services by the Practitioner via telemedicine. I understand that the telemedicine visit will involve communicating with the Practitioner through live audiovisual communication technology and the disclosure of certain medical information by electronic transmission. I acknowledge that I have been given the opportunity to request an in-person assessment or other available alternative prior to the telemedicine visit and am voluntarily participating in the telemedicine visit.  I understand that I have the right to withhold or withdraw my consent to the use of telemedicine in the course of my care at any time, without affecting my right to future care or treatment, and that the Practitioner or I may terminate the telemedicine visit at any time. I understand that I have the right to inspect all information obtained and/or recorded in the course of the telemedicine visit and may receive copies of available information for a reasonable fee.  I understand that some of the potential risks of receiving the Services via telemedicine include:  Marland Kitchen Delay or interruption in medical evaluation due to technological equipment failure or disruption; . Information transmitted may not be sufficient (e.g. poor resolution of images) to allow for appropriate medical decision making by the Practitioner; and/or  . In rare instances, security protocols could fail, causing a breach of personal health information.  Furthermore, I acknowledge that it is my responsibility to provide information about my medical history, conditions and care that is complete and accurate to the best of my ability. I acknowledge that Practitioner's advice, recommendations, and/or decision may be based on factors not within their control, such as incomplete or inaccurate data provided by me or distortions of diagnostic images or  specimens that may result from electronic transmissions. I understand that the practice of medicine is not an exact science and that Practitioner makes no warranties or guarantees regarding treatment outcomes. I acknowledge that I will receive a copy of this consent concurrently upon execution via email to the email address I last provided but may also request a printed copy by calling the office of [facility].    I understand that my insurance will be billed for this visit.   I have read or had this consent read to me. . I understand the contents of this consent, which adequately explains the benefits and risks of the Services being provided via telemedicine.  . I have been provided ample opportunity to ask questions regarding this consent and the Services and have had my questions answered to my satisfaction. . I give my informed consent for the services to be provided through the use of telemedicine in my medical care  By participating in this telemedicine visit I agree to the above.

## 2018-04-29 NOTE — Progress Notes (Signed)
Virtual Visit via Video Note The purpose of this virtual visit is to provide medical care while limiting exposure to the novel coronavirus.    Consent was obtained for video visit:  Yes.   Answered questions that patient had about telehealth interaction:  Yes.   I discussed the limitations, risks, security and privacy concerns of performing an evaluation and management service by telemedicine. I also discussed with the patient that there may be a patient responsible charge related to this service. The patient expressed understanding and agreed to proceed.  Pt location: Home Physician Location: office Name of referring provider:  [name], [name], MD I connected with [name] at patients initiation/request on 05/03/2018 at  2:00 PM EDT by video enabled telemedicine application and verified that I am speaking with the correct person using two identifiers. Pt MRN:  [id number] Pt DOB:  [birth year] Video Participants:  [name];     History of Present Illness:  Patient is seen today in follow-up.  She reports she is taking carbidopa/levodopa 25/100, 1 1/4 tablet four times per day.  Started adding the 1/4 tablets because felt more stiff and balance not as good.  Had one fall in Jan but didn't get hurt.  Pt has lightheadedness with, only if she gets up very quickly.  No near syncope.  No hallucinations.  Mood has been good.  Asks about constipation.  Thinks that she has been drinking plenty of water, close to 64 ounces.  Asks about DBS surgery.  Records have been reviewed since last visit.  She has an appointment with cardiology later this afternoon through telehealth for leg swelling.  She is on Lasix and K-Dur, which is new for her.  An echocardiogram is planned when the pandemic is over and some of the restrictions have been lifted.  Allergies  Allergen Reactions  . Doxycycline Nausea And Vomiting  . Sulfonamide Derivatives     REACTION: HIVES   Current Outpatient Medications on  File Prior to Visit  Medication Sig Dispense Refill  . carbidopa-levodopa (SINEMET IR) 25-100 MG tablet 1 tablet 4 x a day.  Patient may take extra tablet if needed. 450 tablet 1  . cholecalciferol (VITAMIN D) 400 units TABS tablet Take 400 Units by mouth daily.    Marland Kitchen diltiazem (CARDIZEM CD) 180 MG 24 hr capsule Take 1 capsule (180 mg total) by mouth daily. 90 capsule 3  . estradiol (ESTRACE VAGINAL) 0.1 MG/GM vaginal cream INSERT 1/4 OF APPLICATORFUL VAGINALLY TWICE PER WEEK 42.5 g 3  . ferrous sulfate 325 (65 FE) MG EC tablet Take 325 mg by mouth 2 (two) times a week.     . furosemide (LASIX) 20 MG tablet Take 1 tablet (20 mg total) by mouth daily. 30 tablet 11  . potassium chloride (K-DUR) 10 MEQ tablet Take 1 tablet (10 mEq total) by mouth daily. 30 tablet 11  . rivaroxaban (XARELTO) 20 MG TABS tablet Take 1 tablet (20 mg total) by mouth daily. 90 tablet 1   No current facility-administered medications on file prior to visit.    Past Medical History:  Diagnosis Date  . Colles' fracture of right radius 03/05/2015  . DEGENERATIVE JOINT DISEASE, RIGHT HIP 02/16/2007  . Dupuytren's contracture   . Osteoarthritis of hip    right  . Osteoarthritis of left knee   . POSTMENOPAUSAL SYNDROME 02/16/2007  . PREMATURE ATRIAL CONTRACTIONS 02/16/2007  . S/P breast biopsy, left    two o'clock position - benign  . 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 Venous insufficiency    Review of Systems  Constitutional: Negative.   HENT: Negative.   Eyes: Negative.   Respiratory: Negative.   Cardiovascular: Positive for leg swelling.  Gastrointestinal: Positive for constipation.  Skin: Negative.      Observations/Objective:   Vitals:   05/03/18 1402  BP: 100/62  Pulse: 95  Resp: 18  Temp: (!) 97.5 F (36.4 C)  SpO2: 97%  Weight: 139 lb (63 kg)   GEN:  The patient appears stated age and is in NAD.  Neurological examination:  Orientation: The patient is alert and oriented x3. Cranial  nerves: There is good facial symmetry. There is facial hypomimia.  The speech is fluent and clear. Soft palate rises symmetrically and there is no tongue deviation. Hearing is intact to conversational tone. Motor: Strength is at least antigravity x 4.   Shoulder shrug is equal and symmetric.  There is no pronator drift.  Movement examination: Tone: unable Abnormal movements: none noted Coordination:  There is decremation with RAM's, with hand opening and closing and finger taps on the left.  I was unable to see the feet well to assess rapid alternating movements there. Gait and Station: The patient pushes off of the chair to arise.  She has decreased arm swing bilaterally.    Assessment and Plan:    1.  Parkinsons disease             -DaTscan was positive in September, 2018             -Increase carbidopa/levodopa 25/100, to 1.5 tablets 4 times per day.  Risks, benefits, side effects and alternative therapies were discussed.  The opportunity to ask questions was given and they were answered to the best of my ability.  The patient expressed understanding and willingness to follow the outlined treatment protocols.  -Patient asked today about DBS surgery.  Discussed risks and benefits.  Discussed when we would consider surgical interventions.  Discussed that this would not help her balance.  She was disappointed about this.  She has several questions and I answered those to the best of my ability.  -exercises (online) given to patient that she can do since programs cx during pandemic              2.  Mild anxiety and depression             -she denies any depression but still has anxiety but feels that is better control.  3.  Constipation  -discussed nature and pathophysiology and association with PD  -discussed importance of hydration.  Pt states that she is really hydrating quite well  -pt is given a copy of the rancho recipe through my chart  -recommended daily colace  -recommended  miralax prn  4.  LE edema  -Has a cardiology appointment later this afternoon.  They are considering echocardiogram once she is able to get into the office post pandemic.  5.  We will have my office call her to schedule a follow-up appointment.    Follow Up Instructions:    -I discussed the assessment and treatment plan with the patient. The patient was provided an opportunity to ask questions and all were answered. The patient agreed with the plan and demonstrated an understanding of the instructions.   The patient was advised to call back or seek an in-person evaluation if the symptoms worsen or if the condition fails to improve as anticipated.    Alonza Bogus,  DO

## 2018-05-03 ENCOUNTER — Telehealth (INDEPENDENT_AMBULATORY_CARE_PROVIDER_SITE_OTHER): Payer: Medicare Other | Admitting: Cardiovascular Disease

## 2018-05-03 ENCOUNTER — Encounter: Payer: Self-pay | Admitting: *Deleted

## 2018-05-03 ENCOUNTER — Encounter: Payer: Self-pay | Admitting: Cardiovascular Disease

## 2018-05-03 ENCOUNTER — Telehealth (INDEPENDENT_AMBULATORY_CARE_PROVIDER_SITE_OTHER): Payer: Medicare Other | Admitting: Neurology

## 2018-05-03 ENCOUNTER — Encounter: Payer: Self-pay | Admitting: Neurology

## 2018-05-03 ENCOUNTER — Other Ambulatory Visit: Payer: Self-pay

## 2018-05-03 ENCOUNTER — Telehealth: Payer: Self-pay | Admitting: Neurology

## 2018-05-03 VITALS — BP 100/62 | HR 95 | Temp 97.6°F | Resp 18 | Ht 68.0 in | Wt 138.6 lb

## 2018-05-03 DIAGNOSIS — I4819 Other persistent atrial fibrillation: Secondary | ICD-10-CM | POA: Diagnosis not present

## 2018-05-03 DIAGNOSIS — R600 Localized edema: Secondary | ICD-10-CM

## 2018-05-03 DIAGNOSIS — G20 Parkinson's disease: Secondary | ICD-10-CM

## 2018-05-03 DIAGNOSIS — K5901 Slow transit constipation: Secondary | ICD-10-CM

## 2018-05-03 DIAGNOSIS — G20A1 Parkinson's disease without dyskinesia, without mention of fluctuations: Secondary | ICD-10-CM

## 2018-05-03 MED ORDER — FUROSEMIDE 20 MG PO TABS: 20.0000 mg | ORAL_TABLET | ORAL | 3 refills | Status: DC

## 2018-05-03 MED ORDER — POTASSIUM CHLORIDE ER 10 MEQ PO TBCR: 10.0000 meq | EXTENDED_RELEASE_TABLET | ORAL | 3 refills | Status: DC

## 2018-05-03 NOTE — Progress Notes (Signed)
Virtual Visit via Video Note   This visit type was conducted due to national recommendations for restrictions regarding the COVID-19 Pandemic (e.g. social distancing) in an effort to limit this patient's exposure and mitigate transmission in our community.  Due to her co-morbid illnesses, this patient is at least at moderate risk for complications without adequate follow up.  This format is felt to be most appropriate for this patient at this time.  All issues noted in this document were discussed and addressed.  A limited physical exam was performed with this format.  Please refer to the patient's chart for her consent to telehealth for [facility].   Evaluation Performed:  Follow-up visit  Date:  05/03/2018   ID:  Stephanie Shaw, DOB [birth year], MRN [id number]  Patient Location: Home  Provider Location: Office  PCP:  [name], [name], MD  Cardiologist:  Mertie Moores, MD  Electrophysiologist:  None   Chief Complaint:  Leg swelling   History of Present Illness:    Stephanie Shaw is a 78 y.o. female who presents via audio/video conferencing for a telehealth visit today.    Stephanie Shaw is a 78 year old female with a history of chronic atrial fibrillation.  She has been taking diltiazem and Xarelto.  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  She is a previous patient of Loralie Champagne    She is been having significant leg swelling for her on Lasix but the leg swelling apparently did not improve.    She showed [place] her legs Has 1-2+ pitting edema   Shuffles some with ambulation Elevates her legs with 1 pillow     The patient does not have symptoms concerning for COVID-19 infection (fever, chills, cough, or new shortness of breath).    Past Medical History:  Diagnosis Date  . Colles' fracture of right radius 03/05/2015  . DEGENERATIVE JOINT DISEASE, RIGHT HIP 02/16/2007  . Dupuytren's contracture   . Osteoarthritis of hip    right  . Osteoarthritis of left knee    . POSTMENOPAUSAL SYNDROME 02/16/2007  . PREMATURE ATRIAL CONTRACTIONS 02/16/2007  . S/P breast biopsy, left    two o'clock position - benign  . 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 Venous insufficiency    Past Surgical History:  Procedure Laterality Date  . BREAST BIOPSY Left   . CATARACT EXTRACTION, BILATERAL       Current Meds  Medication Sig  . carbidopa-levodopa (SINEMET IR) 25-100 MG tablet 1 tablet 4 x a day.  Patient may take extra tablet if needed.  . cholecalciferol (VITAMIN D) 400 units TABS tablet Take 400 Units by mouth daily.  Marland Kitchen diltiazem (CARDIZEM CD) 180 MG 24 hr capsule Take 1 capsule (180 mg total) by mouth daily.  Marland Kitchen estradiol (ESTRACE VAGINAL) 0.1 MG/GM vaginal cream INSERT 1/4 OF APPLICATORFUL VAGINALLY TWICE PER WEEK  . ferrous sulfate 325 (65 FE) MG EC tablet Take 325 mg by mouth 2 (two) times a week.   . furosemide (LASIX) 20 MG tablet Take 1 tablet (20 mg total) by mouth daily.  . potassium chloride (K-DUR) 10 MEQ tablet Take 1 tablet (10 mEq total) by mouth daily.  . rivaroxaban (XARELTO) 20 MG TABS tablet Take 1 tablet (20 mg total) by mouth daily.     Allergies:   Doxycycline and Sulfonamide derivatives   Social History   Tobacco Use  . Smoking status: Former Smoker    Last attempt to quit: 08/27/1971  Years since quitting: 46.7  . Smokeless tobacco: Never Used  Substance Use Topics  . Alcohol use: No    Alcohol/week: 0.0 standard drinks    Comment: hardly ever  . Drug use: No     Family Hx: The patient's family history includes Alcohol abuse in her father; Cancer in her father and mother; Diabetes in her mother; Heart failure in her mother; Osteoarthritis in her sister. There is no history of Breast cancer.  ROS:   Please see the history of present illness.     All other systems reviewed and are negative.   Prior CV studies:   The following studies were reviewed today:    Labs/Other Tests and Data Reviewed:    EKG:  No ECG  reviewed.  Recent Labs: 06/02/2017: Hemoglobin 12.1; Platelets 199 11/03/2017: BUN 17; Creatinine, Ser 0.73; Potassium 4.2; Sodium 140   Recent Lipid Panel Lab Results  Component Value Date/Time   CHOL 199 08/29/2015 07:41 AM   TRIG 42.0 08/29/2015 07:41 AM   TRIG 35 12/25/2005 09:35 AM   HDL 88.20 08/29/2015 07:41 AM   CHOLHDL 2 08/29/2015 07:41 AM   LDLCALC 103 (H) 08/29/2015 07:41 AM   LDLDIRECT 104.2 01/25/2013 09:21 AM    Wt Readings from Last 3 Encounters:  05/03/18 138 lb 9.6 oz (62.9 kg)  05/03/18 139 lb (63 kg)  05/03/18 139 lb (63 kg)     Objective:    Vital Signs:  BP 100/62 (BP Location: Left Arm, Patient Position: Sitting, Cuff Size: Normal)   Pulse 95   Temp 97.6 F (36.4 C)   Resp 18   Ht 5\' 8"  (1.727 m)   Wt 138 lb 9.6 oz (62.9 kg)   SpO2 97%   BMI 21.07 kg/m    General:   Appears healthy,   NAD,  Elderly female,   Parkinsons  HEENT:   No obvious JVD or lymphadenopathy Resp:   Normal work of breathing,   resp rate is normal  CV :   BP and HR are normal ,  No edema Abd:   No abdomina swelling , Ext:   No clubbing, cyanosis,  She has 2 + leg edema bilaterally  Neuro:   Alert and oriented x 3.   No obvious motor deficits, gait was not assessed  Skin : no obvious rashes    ASSESSMENT & PLAN:    1. Leg edema: The patient has 2+ leg edema.   The edema has not been helped by Lasix and potassium therapy.  It has resulted in a slightly decreased blood pressure.  At this point I think that she would benefit more from conservative methods.  We will give her instructions on how to get a lounge doctor leg rest.  We will have her elevate her legs as much as she can.  COVID-19 Education: The signs and symptoms of COVID-19 were discussed with the patient and how to seek care for testing (follow up with PCP or arrange E-visit).  The importance of social distancing was discussed today.  Time:   Today, I have spent 25  minutes with the patient with telehealth  technology discussing the above problems.     Medication Adjustments/Labs and Tests Ordered: Current medicines are reviewed at length with the patient today.  Concerns regarding medicines are outlined above.  Tests Ordered: No orders of the defined types were placed in this encounter.   Medication Changes: No orders of the defined types were placed in this encounter.  Disposition:  Follow up in 6 month(s)  Signed, Mertie Moores, MD  05/03/2018 4:34 PM    [facility]

## 2018-05-03 NOTE — Patient Instructions (Signed)
Medication Instructions:  Your physician has recommended you make the following change in your medication:  DECREASE Lasix (Furosemide) to 20 mg every other day DECREASE Kdur (potassium chloride) to 10 mEq every other day  If you need a refill on your cardiac medications before your next appointment, please call your pharmacy.   Lab work: Your physician recommends that you return for lab work (basic metabolic panel) in: approximately 1-2 months on the same day as your echocardiogram     Testing/Procedures: Your physician has requested that you have an echocardiogram. Echocardiography is a painless test that uses sound waves to create images of your heart. It provides your doctor with information about the size and shape of your heart and how well your heart's chambers and valves are working. This procedure takes approximately one hour. There are no restrictions for this procedure. **Our office will contact you to schedule this     Follow-Up: At [facility], you and your health needs are our priority.  As part of our continuing mission to provide you with exceptional heart care, we have created designated Provider Care Teams.  These Care Teams include your primary Cardiologist (physician) and Advanced Practice Providers (APPs -  Physician Assistants and Nurse Practitioners) who all work together to provide you with the care you need, when you need it. You will need a follow up appointment in:  6 months.  Please call our office 2 months in advance to schedule this appointment.  You may see Mertie Moores, MD or one of the following Advanced Practice Providers on your designated Care Team: Richardson Dopp, PA-C [place], [place] . [name], NP    For your  leg edema you  should do  the following 1. Leg elevation - I recommend the Lounge Dr. Leg rest.  See below for details  2. Salt restriction  -  Use potassium chloride instead of regular salt as a salt substitute. 3. Walk regularly  4. Compression hose - [facility] 5. Weight loss    Available on [email or web address] Or  Go to [email or web address]

## 2018-05-11 ENCOUNTER — Ambulatory Visit: Payer: Medicare Other | Admitting: Neurology

## 2018-05-20 ENCOUNTER — Telehealth: Payer: Self-pay | Admitting: Cardiovascular Disease

## 2018-05-20 NOTE — Telephone Encounter (Signed)
Left detailed message for patient that she may get compression socks or stockings but not the thigh high or anything higher per Dr. Acie Fredrickson. I advised her to start with the least compression (I believe is 16-20 mm) and to call back if she has questions or concerns.

## 2018-05-20 NOTE — Telephone Encounter (Signed)
New Message    Patient calling to talk to a nurse to see if she can get compression socks.

## 2018-05-31 ENCOUNTER — Telehealth: Payer: Self-pay | Admitting: *Deleted

## 2018-05-31 NOTE — Telephone Encounter (Signed)
Copied from [place] [id number]. Topic: General - Inquiry >> May 31, 2018 10:30 AM Rutherford Nail, NT wrote: Reason for CRM: Molly with [facility] calling and states that she worked with Dr Paulla Fore, but as of about 10 days ago he is no longer with [facility]. States that they received a discharge/discontinue treatment for occupational therapy that the patient has been getting through [facility] [employer] care. States that the form needs signature and date and was wondering if Dr [name] would be willing to sign the discontinue orders? Please advise.   CB#:[phone] (can leave a voicemail if no answer)

## 2018-06-01 NOTE — Telephone Encounter (Signed)
Message sent to Dr. Jordan for review and approval. 

## 2018-06-02 NOTE — Telephone Encounter (Signed)
Documents can be sent to the office and I will review and sign forms Friday. Thanks, BJ

## 2018-06-03 NOTE — Telephone Encounter (Signed)
Spoke with Cloyde Reams and informed her that Dr. [name] would sign forms. Cloyde Reams stated that she would fax forms over today so that forms can be signed and faxed to [employer].

## 2018-06-04 NOTE — Telephone Encounter (Signed)
Form placed on providers desk for completion

## 2018-06-07 ENCOUNTER — Ambulatory Visit: Payer: Medicare Other | Admitting: Cardiovascular Disease

## 2018-06-07 NOTE — Telephone Encounter (Signed)
Form faxed to [employer].

## 2018-07-01 ENCOUNTER — Telehealth (HOSPITAL_COMMUNITY): Payer: Self-pay | Admitting: *Deleted

## 2018-07-01 NOTE — Telephone Encounter (Signed)
Left message for Echo appointment, asked that the call office for COVID screening.

## 2018-07-01 NOTE — Telephone Encounter (Signed)

## 2018-07-02 ENCOUNTER — Telehealth: Payer: Self-pay

## 2018-07-02 ENCOUNTER — Telehealth (HOSPITAL_COMMUNITY): Payer: Self-pay

## 2018-07-02 ENCOUNTER — Other Ambulatory Visit: Payer: Self-pay

## 2018-07-02 ENCOUNTER — Ambulatory Visit (HOSPITAL_COMMUNITY): Payer: Medicare Other | Attending: Cardiovascular Disease

## 2018-07-02 DIAGNOSIS — R600 Localized edema: Secondary | ICD-10-CM | POA: Diagnosis present

## 2018-07-02 DIAGNOSIS — I4819 Other persistent atrial fibrillation: Secondary | ICD-10-CM

## 2018-07-02 DIAGNOSIS — I8290 Acute embolism and thrombosis of unspecified vein: Secondary | ICD-10-CM

## 2018-07-02 DIAGNOSIS — Z01812 Encounter for preprocedural laboratory examination: Secondary | ICD-10-CM

## 2018-07-02 NOTE — Telephone Encounter (Signed)
This is orthostatic hypotension. This is very common inpatients with parkinsons and is made worse by the sinemet.  Try to keep hydrated.   She may hold the lasix on occasion to see if this helps

## 2018-07-02 NOTE — Telephone Encounter (Signed)
Dizziness from prone position to sitting to standing. Patient stated she also is having balancing issues, but this could be due to her parkinson's disease. Asked patient about her BP, patient stated she has not taken it but it is usually low. Patient is coming in for an echo this afternoon. Informed patient that a message would be sent to Dr. Acie Fredrickson so he is aware of her dizziness. Encouraged patient to also call her PCP about her symptoms.

## 2018-07-02 NOTE — Telephone Encounter (Signed)
Left message for patient to call back  

## 2018-07-02 NOTE — Telephone Encounter (Signed)
Patient called back. Patient given recommendations by Dr. Acie Fredrickson. Patient verbalized understanding.

## 2018-07-02 NOTE — Telephone Encounter (Signed)
Called and spoke to patient. Reviewed echo results with patient and made her aware that Dr. Acie Fredrickson would like for her to have a CT of the abdomen and pelvis with contrast to look for thrombi. The patient states that she has been taking her Xarelto and will continue to do so. Patient has been scheduled for a STAT BMET Monday morning at 8:00 AM and will be scheduled for her CT at 3:30 PM (spoke with [facility] in [place]). ER precautions reviewed with the patient who verbalized understanding.

## 2018-07-02 NOTE — Telephone Encounter (Signed)
New message    The patient daughter Stephanie Shaw calling from [place] to inquired about mom echo appt and will she be seeing Dr. Acie Fredrickson when she comes in. The patient daughter was advised that she will be only seeing the echo tech for an hour duration.  The daughter stated her mom was going to send a mychart message noticed her mom did not hit the send button regarding her dizziness and unbalance. The Daughter has a concern about her mom's dizziness and wanted  Dr. Acie Fredrickson to be aware.    STAT if patient feels like he/she is going to faint   1) Are you dizzy now?  Per daughter Stephanie Shaw calling  - when she gets up dizzy &  balance is getting worse   2) Do you feel faint or have you passed out?  - Per daughter Stephanie Shaw  - unaware    3) Do you have any other symptoms? - Per daughter Stephanie Shaw -  H/o parkinson disease   4) Have you checked your HR and BP (record if available)? Per daughter Stephanie Shaw  - unaware

## 2018-07-02 NOTE — Telephone Encounter (Signed)
-----   Message from Thayer Headings, MD sent at 07/02/2018  5:15 PM EDT ----- I have discussed this with Dr. Aundra Dubin who read the echo and also Dr. Janeece Fitting ( radiology)  Please order a CT of the abdomen and pelvis with contrast.  Please make a specific note to perform:  Whole abdomen delayed images.   We are looking for thrombi in the abdomen and pelvis. She will need a BMP prior to the study .  I have called her and left a v/m to make sure she is still taking her Xeralto.

## 2018-07-05 ENCOUNTER — Ambulatory Visit (INDEPENDENT_AMBULATORY_CARE_PROVIDER_SITE_OTHER)
Admission: RE | Admit: 2018-07-05 | Discharge: 2018-07-05 | Disposition: A | Discharge status: Home / Self Care | Payer: Medicare Other | Source: Ambulatory Visit | Attending: Cardiovascular Disease | Admitting: Cardiovascular Disease

## 2018-07-05 ENCOUNTER — Other Ambulatory Visit: Payer: Medicare Other | Admitting: *Deleted

## 2018-07-05 ENCOUNTER — Other Ambulatory Visit: Payer: Self-pay

## 2018-07-05 DIAGNOSIS — I8290 Acute embolism and thrombosis of unspecified vein: Secondary | ICD-10-CM

## 2018-07-05 DIAGNOSIS — Z01812 Encounter for preprocedural laboratory examination: Secondary | ICD-10-CM

## 2018-07-05 LAB — BASIC METABOLIC PANEL
BUN/Creatinine Ratio: 26 (ref 12–28)
BUN: 21 mg/dL (ref 8–27)
CO2: 27 mmol/L (ref 20–29)
Calcium: 9.5 mg/dL (ref 8.7–10.3)
Chloride: 104 mmol/L (ref 96–106)
Creatinine, Ser: 0.81 mg/dL (ref 0.57–1.00)
GFR calc Af Amer: 80 mL/min/{1.73_m2} (ref >59)
GFR calc non Af Amer: 70 mL/min/{1.73_m2} (ref >59)
Glucose: 131 mg/dL — ABNORMAL HIGH (ref 65–99)
Potassium: 3.9 mmol/L (ref 3.5–5.2)
Sodium: 141 mmol/L (ref 134–144)

## 2018-07-05 MED ORDER — IOHEXOL 350 MG/ML SOLN
100.0000 mL | Freq: Once | INTRAVENOUS | Status: AC | PRN
  Administered 2018-07-05: 100 mL via INTRAVENOUS

## 2018-07-14 ENCOUNTER — Encounter: Payer: Self-pay | Admitting: Family Medicine

## 2018-07-15 ENCOUNTER — Encounter: Payer: Self-pay | Admitting: Family Medicine

## 2018-07-19 ENCOUNTER — Ambulatory Visit (INDEPENDENT_AMBULATORY_CARE_PROVIDER_SITE_OTHER): Payer: Medicare Other | Admitting: Family Medicine

## 2018-07-19 ENCOUNTER — Other Ambulatory Visit: Payer: Self-pay

## 2018-07-19 ENCOUNTER — Encounter: Payer: Self-pay | Admitting: Family Medicine

## 2018-07-19 ENCOUNTER — Telehealth: Payer: Self-pay | Admitting: Nurse Practitioner

## 2018-07-19 VITALS — BP 102/74 | HR 80 | Temp 97.5°F | Resp 16 | Ht 68.0 in | Wt 134.1 lb

## 2018-07-19 DIAGNOSIS — M62830 Muscle spasm of back: Secondary | ICD-10-CM | POA: Diagnosis not present

## 2018-07-19 DIAGNOSIS — R600 Localized edema: Secondary | ICD-10-CM

## 2018-07-19 DIAGNOSIS — R5383 Other fatigue: Secondary | ICD-10-CM

## 2018-07-19 DIAGNOSIS — K5901 Slow transit constipation: Secondary | ICD-10-CM | POA: Insufficient documentation

## 2018-07-19 DIAGNOSIS — I7090 Unspecified atherosclerosis: Secondary | ICD-10-CM

## 2018-07-19 DIAGNOSIS — K5900 Constipation, unspecified: Secondary | ICD-10-CM

## 2018-07-19 DIAGNOSIS — R200 Anesthesia of skin: Secondary | ICD-10-CM

## 2018-07-19 DIAGNOSIS — N83201 Unspecified ovarian cyst, right side: Secondary | ICD-10-CM

## 2018-07-19 DIAGNOSIS — I708 Atherosclerosis of other arteries: Secondary | ICD-10-CM

## 2018-07-19 HISTORY — DX: Localized edema: R60.0

## 2018-07-19 HISTORY — DX: Constipation, unspecified: K59.00

## 2018-07-19 MED ORDER — ROSUVASTATIN CALCIUM 10 MG PO TABS: 10.0000 mg | ORAL_TABLET | Freq: Every day | ORAL | 3 refills | Status: DC

## 2018-07-19 NOTE — Telephone Encounter (Signed)
Orders placed and Rx sent to patient's pharmacy Lab appointment scheduled for 10/1

## 2018-07-19 NOTE — Assessment & Plan Note (Addendum)
This is a chronic problem. Work-up done in the past has been otherwise negative. We discussed possible etiologies, most likely related to parkinson's disease and/or medications.

## 2018-07-19 NOTE — Progress Notes (Signed)
HPI:  Chief Complaint  Patient presents with  . Bilateral edema    bilateral swelling in feet and legs    [name] is a 78 y.o. female, who is here today with a few concerns.   Several months of lower extremities edema. Alleviated by LE elevations. When she gets up she still notices some edema,worse at the end of the day. She was prescribed Furosemide 20 mg q 2 days by her cardiologist on 05/03/2018. She stopped med because it did not helps with feet swelling,just with ankle edema. Also medication caused urine frequently.  2 months of mild numbness sensation of toes. No focal weakness. Negative for color changes or skin lesions.  She denies orthopnea,PND,decreased urine output,foam in urine,or gross hematuria.  Right ovarian cyst found incidentally on abdominal CT done on 07/06/18.  There is a simple appearing right ovarian cyst measuring 2.8 cm. She denies pelvic pain, vaginal bleeding, or urinary symptoms.   Also concerned about constipation, she is having a bowel movement every 5 to 7 days. Last bowel movement about 5 to 6 days ago. She denies blood in the stool. She has not identified exacerbating or alleviating factors. She denies abdominal pain, nausea, or vomiting. She has not tried OTC medications.  Upper back "stiffness/tension" sensation for several months. It is better in the morning when she gets up and gets worse as the day goes.  Cervical pain since yesterday with limitations of ROM. No Hx of trauma.  Pain exacerbated by movement. Local massage "helped a lot." It is better today.  She is also planning gon visiting her daughter in [place], about 11-12 hours. She wants to know if it is ok for her to do so. She is not driving,her daughter is picking her up.  C/O fatigue, which has been going on for a while, 2018.  Lab Results  Component Value Date   TSH 1.00 08/19/2016   Parkinson disease,following with Dr Carles Collet. Last follow up in  04/2018. Unstable gait,she is not using any assistance. She drives sometimes., today her son brought her to her appt.  Atrial fibrillation,she has not noted palpitations,CP,or dyspnea. She is on Diltiazem CD 180 mg daily and Xarelto 20 mg daily.   Review of Systems  Constitutional: Negative for chills and fever.  HENT: Negative for nosebleeds and sore throat.   Respiratory: Negative for cough, shortness of breath and wheezing.   Gastrointestinal: Negative for blood in stool.  Endocrine: Negative for cold intolerance and heat intolerance.  Genitourinary: Negative for decreased urine volume, dysuria and hematuria.  Musculoskeletal: Positive for gait problem.  Neurological: Positive for tremors. Negative for facial asymmetry and headaches.  Hematological: Does not bruise/bleed easily.  Rest see pertinent positives and negatives per HPI.   Current Outpatient Medications on File Prior to Visit  Medication Sig Dispense Refill  . carbidopa-levodopa (SINEMET IR) 25-100 MG tablet 1 tablet 4 x a day.  Patient may take extra tablet if needed. 450 tablet 1  . cholecalciferol (VITAMIN D) 400 units TABS tablet Take 400 Units by mouth daily.    Marland Kitchen diltiazem (CARDIZEM CD) 180 MG 24 hr capsule Take 1 capsule (180 mg total) by mouth daily. 90 capsule 3  . estradiol (ESTRACE VAGINAL) 0.1 MG/GM vaginal cream INSERT 1/4 OF APPLICATORFUL VAGINALLY TWICE PER WEEK 42.5 g 3  . ferrous sulfate 325 (65 FE) MG EC tablet Take 325 mg by mouth 2 (two) times a week.     . furosemide (  LASIX) 20 MG tablet Take 1 tablet (20 mg total) by mouth every other day. 90 tablet 3  . potassium chloride (K-DUR) 10 MEQ tablet Take 1 tablet (10 mEq total) by mouth every other day. 90 tablet 3  . rivaroxaban (XARELTO) 20 MG TABS tablet Take 1 tablet (20 mg total) by mouth daily. 90 tablet 1   No current facility-administered medications on file prior to visit.      Past Medical History:  Diagnosis Date  . Colles' fracture of  right radius 03/05/2015  . DEGENERATIVE JOINT DISEASE, RIGHT HIP 02/16/2007  . Dupuytren's contracture   . Osteoarthritis of hip    right  . Osteoarthritis of left knee   . POSTMENOPAUSAL SYNDROME 02/16/2007  . PREMATURE ATRIAL CONTRACTIONS 02/16/2007  . S/P breast biopsy, left    two o'clock position - benign  . 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 Venous insufficiency    Allergies  Allergen Reactions  . Doxycycline Nausea And Vomiting  . Sulfonamide Derivatives     REACTION: HIVES    Social History   Socioeconomic History  . Marital status: Divorced    Spouse name: Not on file  . Number of children: Not on file  . Years of education: Not on file  . Highest education level: Not on file  Occupational History  . Occupation: retired    Comment: math, [employer]  Social Needs  . Financial resource strain: Not on file  . Food insecurity    Worry: Not on file    Inability: Not on file  . Transportation needs    Medical: Not on file    Non-medical: Not on file  Tobacco Use  . Smoking status: Former Smoker    Quit date: 08/27/1971    Years since quitting: 46.9  . Smokeless tobacco: Never Used  Substance and Sexual Activity  . Alcohol use: No    Alcohol/week: 0.0 standard drinks    Comment: hardly ever  . Drug use: No  . Sexual activity: Not on file  Lifestyle  . Physical activity    Days per week: Not on file    Minutes per session: Not on file  . Stress: Not on file  Relationships  . Social Herbalist on phone: Not on file    Gets together: Not on file    Attends religious service: Not on file    Active member of club or organization: Not on file    Attends meetings of clubs or organizations: Not on file    Relationship status: Not on file  Other Topics Concern  . Not on file  Social History Narrative  . Not on file    Vitals:   07/19/18 1441  BP: 102/74  Pulse: 80  Resp: 16  Temp: (!) 97.5 F (36.4 C)  SpO2: 96%   Body mass index is 20.39  kg/m.   Physical Exam  Nursing note and vitals reviewed. Constitutional: She is oriented to person, place, and time. She appears well-developed. No distress.  HENT:  Head: Normocephalic and atraumatic.  Mouth/Throat: Oropharynx is clear and moist and mucous membranes are normal.  Eyes: Pupils are equal, round, and reactive to light. Conjunctivae are normal.  Cardiovascular: Normal rate and regular rhythm.  No murmur heard. Pulses:      Dorsalis pedis pulses are 2+ on the right side and 2+ on the left side.  + varicose veins LE, bilateral.  Respiratory: Effort normal and breath sounds normal. No respiratory  distress.  GI: Soft. She exhibits no mass. There is no hepatomegaly. There is no abdominal tenderness.  Musculoskeletal:        General: Edema (1+ pitting LE edema,bilateral.Pedal trace edema,bilateral.) present.  Neurological: She is alert and oriented to person, place, and time. Gait abnormal.  No focal deficit appreciated. Monofilament mildly decreased on 4 feet, bilateral.  Skin: Skin is warm. No rash noted. No erythema.  Psychiatric: She has a normal mood and affect.  Well groomed, good eye contact.    ASSESSMENT AND PLAN:  Ms. Stephanie Shaw was seen today for bilateral edema.  Diagnoses and all orders for this visit:  Numbness of feet ? Neuropathy. Other possible causes discussed. Instructed to discuss it during neurology visit.  Muscle spasm of back Local massage and heat. Topical Icy hot. For now I do not recommend muscle relaxants ,which may increase risk for falls. I do not think imaging is needed,no Hx of trauma.  Right ovarian cyst Possible cause discussed, including the possibility of malignancy. Instructed about warning signs.  -     Ambulatory referral to Gynecology  Fatigue This is a chronic problem. Work-up done in the past has been otherwise negative. We discussed possible etiologies, most likely related to parkinson's disease and/or medications.     Constipation Adequate fiber and fluid intake. Recommend OTC Benefiber 1 teaspoon twice daily. MiraLAX daily as needed. Instructed about warning signs. Follow-up in 3 months.  Bilateral lower extremity edema We discussed possible etiologies. She is not interested in continuing furosemide. Could be related to vein disease. Lower extremity elevation above waist level a few times during the day + compression stockings may help. Adequate skin care.   Return in about 3 months (around 10/19/2018).   -[name] was advised to seek immediate medical attention if sudden worsening symptoms or to follow if they persist or if new concerns arise.      G. [name], MD  [facility]. [place] office.

## 2018-07-19 NOTE — Assessment & Plan Note (Signed)
We discussed possible etiologies. She is not interested in continuing furosemide. Could be related to vein disease. Lower extremity elevation above waist level a few times during the day + compression stockings may help. Adequate skin care.

## 2018-07-19 NOTE — Patient Instructions (Signed)
A few things to remember from today's visit:   Constipation, unspecified constipation type  Bilateral lower extremity edema  Numbness of feet  Muscle spasm of back  Right ovarian cyst  Fall precautions, recommend using a Lilibeth. Recommend gynecology evaluation for right ovarian cyst. Benefiber 1 teaspoon in the morning and at night. MiraLAX daily at night as needed. Adequate fiber and fluid intake. Medication can contribute to leg constipation.  Lower extremity edema will be related to vein disease and/or medications. Lower extremity elevation above waist level and compression stockings may help.  Aspercreme or icy hot on upper back. Local massage and/or local heat.  Good feet care, wider shoes. Continue monitoring normal sensation.   Please be sure medication list is accurate. If a new problem present, please set up appointment sooner than planned today.

## 2018-07-19 NOTE — Telephone Encounter (Signed)
-----   Message from Thayer Headings, MD sent at 07/19/2018  4:12 PM EDT ----- I called patient and left a message.  No evidence of IVC thrombus. Please start Rosuvastatin 10 mg a day  Check labs in 3 months. Follow up office visit in 3 months - sooner if neede.d

## 2018-07-19 NOTE — Assessment & Plan Note (Signed)
Adequate fiber and fluid intake. Recommend OTC Benefiber 1 teaspoon twice daily. MiraLAX daily as needed. Instructed about warning signs. Follow-up in 3 months.

## 2018-07-20 NOTE — Telephone Encounter (Signed)
Called patient no answer left provider message on voice mail

## 2018-07-20 NOTE — Telephone Encounter (Signed)
Daughter sent mychart message asking about if okay for statin.  Doesn't look like daughter is on [place].  Please call patient and tell her from my standpoint, I have no objection to her taking the Crestor and will otherwise defer to her other doctors recommendations.

## 2018-07-21 ENCOUNTER — Encounter: Payer: Self-pay | Admitting: Family Medicine

## 2018-07-27 IMAGING — NM NM DATSCAN
3 series · 18 of 18 positions shown · non-contrast
Comparison: Brain MRI 09/05/2016

CLINICAL DATA: 76-year-old female with increased tremors, poor gait
dysfunction, hand dysfunction of the LEFT arm and hand.

EXAM:
NUCLEAR MEDICINE BRAIN IMAGING WITH SPECT  (DaTscan )
TECHNIQUE: SPECT images of the brain were obtained after intravenous injection
of radiopharmaceutical. 4 hour post injection imaging. Appropriate
positioning. 0.8 ml lugols solution administered in a.m
RADIOPHARMACEUTICALS:  4.7 millicuries I 123 Ioflupane

[Series 1: spect - (id) _(id) · 4.1mm · 4.14mm/px · 6 of 128 frames shown]
[frame 11/128]
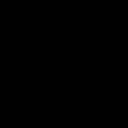
[frame 32/128]
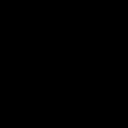
[frame 54/128]
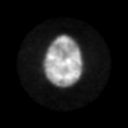
[frame 75/128]
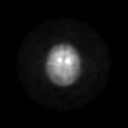
[frame 96/128]
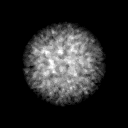
[frame 118/128]
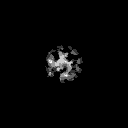

[Series 1: brain spect · 4.14mm/px · 6 of 120 frames shown]
[frame 11/120  full-range]
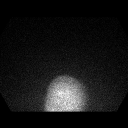
[frame 31/120  full-range]
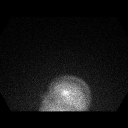
[frame 51/120  full-range]
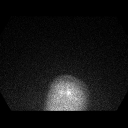
[frame 71/120  full-range]
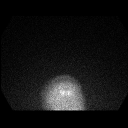
[frame 91/120  full-range]
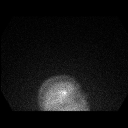
[frame 111/120  full-range]
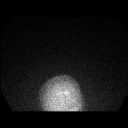

[Series 1: spect - (id) _(id)_cor · 4.1mm · 4.14mm/px · 6 of 128 frames shown]
[frame 11/128]
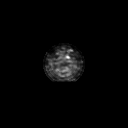
[frame 32/128]
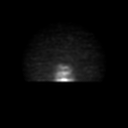
[frame 54/128]
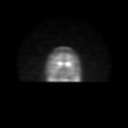
[frame 75/128]
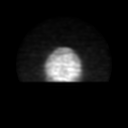
[frame 96/128]
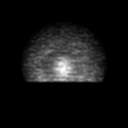
[frame 118/128]
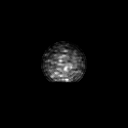

[18 of 18 positions shown; findings below may reference images not displayed]

FINDINGS: Poor uptake of the dopamine transporter specific radiotracer within
the LEFT and RIGHT putamen. Additionally, there is decreased
radiotracer within the head of the RIGHT caudate nucleus compared to
the LEFT caudate nucleus.
IMPRESSION: Poor uptake of dopamine transport specific radiotracer within the
LEFT and RIGHT putamen and decrease relative uptake in the head of
the RIGHT caudate nucleus is in pattern typical of Parkinson's
syndrome.

## 2018-07-28 ENCOUNTER — Telehealth: Payer: Self-pay | Admitting: *Deleted

## 2018-07-28 NOTE — Telephone Encounter (Signed)
Copied from [place] [id number]. Topic: General - Other >> Jul 28, 2018  1:28 PM Mcneil, Ja-Kwan wrote: Reason for CRM: Pt stated she needs a handicapped placard form completed so she can pick it up and take it to the [facility]. Pt requests call back once form has been completed.

## 2018-07-29 NOTE — Telephone Encounter (Signed)
Form placed on provider's desk for approval and signature.

## 2018-07-30 ENCOUNTER — Other Ambulatory Visit: Payer: Self-pay

## 2018-07-30 NOTE — Telephone Encounter (Signed)
Left vm informing patient that form is completed and ready for pick up.

## 2018-08-02 ENCOUNTER — Ambulatory Visit (INDEPENDENT_AMBULATORY_CARE_PROVIDER_SITE_OTHER): Payer: Medicare Other | Admitting: Gynecology

## 2018-08-02 ENCOUNTER — Encounter: Payer: Self-pay | Admitting: Gynecology

## 2018-08-02 ENCOUNTER — Other Ambulatory Visit: Payer: Self-pay

## 2018-08-02 VITALS — BP 110/64 | Ht 68.0 in | Wt 134.0 lb

## 2018-08-02 DIAGNOSIS — N83201 Unspecified ovarian cyst, right side: Secondary | ICD-10-CM

## 2018-08-02 NOTE — Patient Instructions (Signed)
Follow-up for the ultrasound as scheduled. 

## 2018-08-02 NOTE — Progress Notes (Signed)
    Stephanie Shaw 09/29/1940 [id number]        78 y.o.  [id number] new patient referred because of incidental finding of a 2.8 cm simple cyst right ovary, nonenhancing on CT venogram ordered because of foot and ankle swelling and prior IVC thrombus.  She is without pelvic complaints of discomfort, bloating or any bleeding.  She did have a CT scan in 2011 for abdominal bloating which was attributed to constipation.  A 1.6 cm right ovarian cyst was noted at that time.  Past medical history,surgical history, problem list, medications, allergies, family history and social history were all reviewed and documented in the EPIC chart.  Directed ROS with pertinent positives and negatives documented in the history of present illness/assessment and plan.  Exam: Caryn Bee assistant Vitals:   08/02/18 1152  BP: 110/64  Weight: 134 lb (60.8 kg)  Height: 5\' 8"  (1.727 m)   General appearance:  Normal Abdomen soft nontender without masses guarding rebound Pelvic external BUS vagina with atrophic changes.  Cervix difficult to visualize.  Uterus difficult to palpate but no gross masses or tenderness.  Adnexa without masses or tenderness.  Rectal exam is normal.  Assessment/Plan:  78 y.o. [id number] with history of 2.8 cm simple appearing nonenhancing right ovarian cyst on vascular CT.  Review of records did show a 1.6 cm right ovarian cyst almost 10 years ago.  We discussed overwhelmingly this most likely is a benign process.  I did recommend baseline CA 125 today and follow-up pelvic ultrasound just for better definition as far as complexity and vascularity.  Assuming benign in appearance and CA 125 negative then I would recommend expectant management.  We discussed the various possibilities to range from benign to malignant and her questions were answered to her satisfaction.    Anastasio Auerbach MD, 12:29 PM 08/02/2018

## 2018-08-03 NOTE — Telephone Encounter (Signed)
Patient picked up form

## 2018-08-03 NOTE — Telephone Encounter (Signed)
Patient informed that form is available for pick up. Patient stated that she will call when she is outside so that I can bring it out to her.

## 2018-08-04 ENCOUNTER — Encounter: Payer: Self-pay | Admitting: Gynecology

## 2018-08-04 LAB — CA 125: CA 125: 11 U/mL (ref <35)

## 2018-08-06 NOTE — Telephone Encounter (Signed)
error 

## 2018-08-11 NOTE — Progress Notes (Signed)
   Virtual Visit via Telephone Note  I connected with@ on 08/12/18 at 10:00 AM EDT by telephone and verified that I am speaking with the correct person using two identifiers.   I discussed the limitations, risks, security and privacy concerns of performing an evaluation and management service by telephone and the availability of in person appointments. I also discussed with the patient that there may be a patient responsible charge related to this service. The patient expressed understanding and agreed to proceed.  Location patient: home Location provider:  home office Participants present for the call: patient, provider Patient did not have a visit in the prior 7 days to address this/these issue(s).   History of Present Illness: Stephanie Shaw  Presents  With weeks of some urinary frequency and urgency but then got worse over the last week with burning  And dribbling  Like a utis  No fever falnk pain  Gross hematuria  Had UTI last year FEb  E coli and got better with antibiotic   Other on going issues parkin sons  ( can drive)    Uses [facility] .   afib stable  And  Recent eval   gyne dr Phineas Real   Had OV for Cyst right ovary 2.8 cm  7 13 20  with pelvic exam  To do [place]  ROS constipation  rx fiber and miralax as needed  No specific urinary disease Observations/Objective: Patient sounds cheerful and well on the phone. I do not appreciate any SOB. Speech and thought processing are grossly intact. Patient reported vitals: No fever  frineds home  Lab Results  Component Value Date   WBC 6.4 06/02/2017   HGB 12.1 06/02/2017   HCT 38.3 06/02/2017   PLT 199 06/02/2017   GLUCOSE 131 (H) 07/05/2018   CHOL 199 08/29/2015   TRIG 42.0 08/29/2015   HDL 88.20 08/29/2015   LDLDIRECT 104.2 01/25/2013   LDLCALC 103 (H) 08/29/2015   ALT 15 08/19/2016   AST 21 08/19/2016   NA 141 07/05/2018   K 3.9 07/05/2018   CL 104 07/05/2018   CREATININE 0.81 07/05/2018   BUN 21 07/05/2018   CO2 27  07/05/2018   TSH 1.00 08/19/2016    Assessment and Plan:   ICD-10-CM   1. Suspected UTI  R39.89 Urinalysis, Routine w reflex microscopic    Urine Culture  2. Dysuria  R30.0 Urinalysis, Routine w reflex microscopic    Urine Culture     Follow Up Instructions:  Get specimen to [facility]    Begin antibiotic  And  Then go from there  Orders placed  For  ua and cx and sent in macrobid that she tool last year with success for e coli pan sensitive   [id number] 5-10 99442 11-20 9443 21-30 I did not refer this patient for an OV in the next 24 hours for this/these issue(s).  I discussed the assessment and treatment plan with the patient. The patient was provided an opportunity to ask questions and all were answered. The patient agreed with the plan and demonstrated an understanding of the instructions.   The patient was advised to call back or seek an in-person evaluation if the symptoms worsen or if the condition fails to improve as anticipated.  I provided 11 minutes of non-face-to-face time during this encounter.   Shanon Ace, MD

## 2018-08-12 ENCOUNTER — Other Ambulatory Visit (INDEPENDENT_AMBULATORY_CARE_PROVIDER_SITE_OTHER): Payer: Medicare Other

## 2018-08-12 ENCOUNTER — Other Ambulatory Visit: Payer: Self-pay

## 2018-08-12 ENCOUNTER — Ambulatory Visit (INDEPENDENT_AMBULATORY_CARE_PROVIDER_SITE_OTHER): Payer: Medicare Other | Admitting: Internal Medicine

## 2018-08-12 ENCOUNTER — Encounter: Payer: Self-pay | Admitting: Internal Medicine

## 2018-08-12 DIAGNOSIS — R3989 Other symptoms and signs involving the genitourinary system: Secondary | ICD-10-CM

## 2018-08-12 DIAGNOSIS — R300 Dysuria: Secondary | ICD-10-CM | POA: Diagnosis not present

## 2018-08-12 LAB — URINALYSIS, ROUTINE W REFLEX MICROSCOPIC
Bilirubin Urine: NEGATIVE
Nitrite: NEGATIVE
Specific Gravity, Urine: 1.02 (ref 1.000–1.030)
Total Protein, Urine: 30 — AB
Urine Glucose: NEGATIVE
Urobilinogen, UA: 0.2 (ref 0.0–1.0)
pH: 6 (ref 5.0–8.0)

## 2018-08-12 MED ORDER — NITROFURANTOIN MONOHYD MACRO 100 MG PO CAPS: 100.0000 mg | ORAL_CAPSULE | Freq: Two times a day (BID) | ORAL | 0 refills | Status: DC

## 2018-08-14 LAB — URINE CULTURE
MICRO NUMBER:: 697122
SPECIMEN QUALITY:: ADEQUATE

## 2018-08-16 MED ORDER — CEPHALEXIN 500 MG PO CAPS: 500.0000 mg | ORAL_CAPSULE | Freq: Three times a day (TID) | ORAL | 0 refills | Status: DC

## 2018-08-24 ENCOUNTER — Other Ambulatory Visit: Payer: Self-pay

## 2018-08-25 ENCOUNTER — Ambulatory Visit (INDEPENDENT_AMBULATORY_CARE_PROVIDER_SITE_OTHER): Payer: Medicare Other | Admitting: Gynecology

## 2018-08-25 ENCOUNTER — Ambulatory Visit (INDEPENDENT_AMBULATORY_CARE_PROVIDER_SITE_OTHER): Payer: Medicare Other

## 2018-08-25 ENCOUNTER — Encounter: Payer: Self-pay | Admitting: Gynecology

## 2018-08-25 VITALS — BP 114/70

## 2018-08-25 DIAGNOSIS — N83201 Unspecified ovarian cyst, right side: Secondary | ICD-10-CM | POA: Diagnosis not present

## 2018-08-25 NOTE — Patient Instructions (Signed)
Follow-up if any GYN issues.

## 2018-08-25 NOTE — Progress Notes (Signed)
    Stephanie Shaw 1940-10-23 [id number]        78 y.o.  [id number] presents for ultrasound.  History of CT scan done for other reasons showing an incidental 2.8 cm simple right ovarian cyst.  Non-enhancing.  She does have a history of right ovarian cyst 1.6 cm on CT scan done in 2011.  CA 125 recently was 11.  Past medical history,surgical history, problem list, medications, allergies, family history and social history were all reviewed and documented in the EPIC chart.  Directed ROS with pertinent positives and negatives documented in the history of present illness/assessment and plan.  Exam: Vitals:   08/25/18 1443  BP: 114/70   General appearance:  Normal  Ultrasound transvaginal shows uterus normal size and echotexture.  Endometrial echo 1.8 mm.  Right ovary with adjacent thin-walled echo-free avascular cyst 32 x 25 x 33 mm.  Left ovary not visualized transabdominally or transvaginally.  Cul-de-sac negative  Assessment/Plan:  78 y.o. [id number] with small, avascular, simple right ovarian cyst with negative CA 125.  Recommend no further intervention or follow-up needed.  Patient agrees with the plan.    Anastasio Auerbach MD, 3:04 PM 08/25/2018

## 2018-09-11 ENCOUNTER — Other Ambulatory Visit: Payer: Self-pay

## 2018-09-11 ENCOUNTER — Inpatient Hospital Stay (HOSPITAL_COMMUNITY)
Admission: EM | Admit: 2018-09-11 | Discharge: 2018-09-14 | DRG: 690 | Disposition: A | Discharge status: Skilled Nursing Facility | Payer: Medicare Other | Attending: Family Medicine | Admitting: Family Medicine

## 2018-09-11 ENCOUNTER — Encounter (HOSPITAL_COMMUNITY): Payer: Self-pay | Admitting: Emergency Medicine

## 2018-09-11 ENCOUNTER — Emergency Department (HOSPITAL_COMMUNITY): Payer: Medicare Other

## 2018-09-11 DIAGNOSIS — G20 Parkinson's disease: Secondary | ICD-10-CM

## 2018-09-11 DIAGNOSIS — I480 Paroxysmal atrial fibrillation: Secondary | ICD-10-CM | POA: Diagnosis present

## 2018-09-11 DIAGNOSIS — I4891 Unspecified atrial fibrillation: Secondary | ICD-10-CM | POA: Diagnosis present

## 2018-09-11 DIAGNOSIS — Z79899 Other long term (current) drug therapy: Secondary | ICD-10-CM

## 2018-09-11 DIAGNOSIS — M545 Low back pain: Secondary | ICD-10-CM | POA: Diagnosis present

## 2018-09-11 DIAGNOSIS — M720 Palmar fascial fibromatosis [Dupuytren]: Secondary | ICD-10-CM | POA: Diagnosis present

## 2018-09-11 DIAGNOSIS — G20A1 Parkinson's disease without dyskinesia, without mention of fluctuations: Secondary | ICD-10-CM | POA: Diagnosis present

## 2018-09-11 DIAGNOSIS — E860 Dehydration: Secondary | ICD-10-CM | POA: Diagnosis present

## 2018-09-11 DIAGNOSIS — N390 Urinary tract infection, site not specified: Secondary | ICD-10-CM | POA: Diagnosis not present

## 2018-09-11 DIAGNOSIS — R531 Weakness: Secondary | ICD-10-CM

## 2018-09-11 DIAGNOSIS — R64 Cachexia: Secondary | ICD-10-CM | POA: Diagnosis present

## 2018-09-11 DIAGNOSIS — M6282 Rhabdomyolysis: Secondary | ICD-10-CM | POA: Diagnosis present

## 2018-09-11 DIAGNOSIS — R748 Abnormal levels of other serum enzymes: Secondary | ICD-10-CM | POA: Diagnosis present

## 2018-09-11 DIAGNOSIS — Z6820 Body mass index (BMI) 20.0-20.9, adult: Secondary | ICD-10-CM

## 2018-09-11 DIAGNOSIS — Z20828 Contact with and (suspected) exposure to other viral communicable diseases: Secondary | ICD-10-CM | POA: Diagnosis present

## 2018-09-11 DIAGNOSIS — Z87891 Personal history of nicotine dependence: Secondary | ICD-10-CM

## 2018-09-11 DIAGNOSIS — E785 Hyperlipidemia, unspecified: Secondary | ICD-10-CM | POA: Diagnosis present

## 2018-09-11 DIAGNOSIS — Z7901 Long term (current) use of anticoagulants: Secondary | ICD-10-CM

## 2018-09-11 DIAGNOSIS — R071 Chest pain on breathing: Secondary | ICD-10-CM | POA: Diagnosis present

## 2018-09-11 DIAGNOSIS — S52531A Colles' fracture of right radius, initial encounter for closed fracture: Secondary | ICD-10-CM | POA: Diagnosis present

## 2018-09-11 DIAGNOSIS — M542 Cervicalgia: Secondary | ICD-10-CM | POA: Diagnosis present

## 2018-09-11 DIAGNOSIS — I491 Atrial premature depolarization: Secondary | ICD-10-CM | POA: Diagnosis present

## 2018-09-11 DIAGNOSIS — W06XXXA Fall from bed, initial encounter: Secondary | ICD-10-CM | POA: Diagnosis present

## 2018-09-11 DIAGNOSIS — Z882 Allergy status to sulfonamides status: Secondary | ICD-10-CM

## 2018-09-11 DIAGNOSIS — M1611 Unilateral primary osteoarthritis, right hip: Secondary | ICD-10-CM | POA: Diagnosis present

## 2018-09-11 DIAGNOSIS — Z880 Allergy status to penicillin: Secondary | ICD-10-CM

## 2018-09-11 DIAGNOSIS — M1712 Unilateral primary osteoarthritis, left knee: Secondary | ICD-10-CM | POA: Diagnosis present

## 2018-09-11 DIAGNOSIS — G8929 Other chronic pain: Secondary | ICD-10-CM | POA: Diagnosis present

## 2018-09-11 DIAGNOSIS — I872 Venous insufficiency (chronic) (peripheral): Secondary | ICD-10-CM | POA: Diagnosis present

## 2018-09-11 HISTORY — DX: Paroxysmal atrial fibrillation: I48.0

## 2018-09-11 HISTORY — DX: Parkinson's disease without dyskinesia, without mention of fluctuations: G20.A1

## 2018-09-11 HISTORY — DX: Parkinson's disease: G20

## 2018-09-11 LAB — CBC WITH DIFFERENTIAL/PLATELET
Abs Immature Granulocytes: 0.03 10*3/uL (ref 0.00–0.07)
Basophils Absolute: 0 10*3/uL (ref 0.0–0.1)
Basophils Relative: 0 %
Eosinophils Absolute: 0 10*3/uL (ref 0.0–0.5)
Eosinophils Relative: 0 %
HCT: 40.1 % (ref 36.0–46.0)
Hemoglobin: 12.6 g/dL (ref 12.0–15.0)
Immature Granulocytes: 0 %
Lymphocytes Relative: 4 %
Lymphs Abs: 0.5 10*3/uL — ABNORMAL LOW (ref 0.7–4.0)
MCH: 30.3 pg (ref 26.0–34.0)
MCHC: 31.4 g/dL (ref 30.0–36.0)
MCV: 96.4 fL (ref 80.0–100.0)
Monocytes Absolute: 0.7 10*3/uL (ref 0.1–1.0)
Monocytes Relative: 7 %
Neutro Abs: 9.4 10*3/uL — ABNORMAL HIGH (ref 1.7–7.7)
Neutrophils Relative %: 89 %
Platelets: 170 10*3/uL (ref 150–400)
RBC: 4.16 MIL/uL (ref 3.87–5.11)
RDW: 13.1 % (ref 11.5–15.5)
WBC: 10.6 10*3/uL — ABNORMAL HIGH (ref 4.0–10.5)
nRBC: 0 % (ref 0.0–0.2)

## 2018-09-11 LAB — BASIC METABOLIC PANEL
Anion gap: 11 (ref 5–15)
BUN: 29 mg/dL — ABNORMAL HIGH (ref 8–23)
CO2: 22 mmol/L (ref 22–32)
Calcium: 9.9 mg/dL (ref 8.9–10.3)
Chloride: 106 mmol/L (ref 98–111)
Creatinine, Ser: 0.98 mg/dL (ref 0.44–1.00)
GFR calc Af Amer: >60 mL/min (ref >60)
GFR calc non Af Amer: 55 mL/min — ABNORMAL LOW (ref >60)
Glucose, Bld: 99 mg/dL (ref 70–99)
Potassium: 3.7 mmol/L (ref 3.5–5.1)
Sodium: 139 mmol/L (ref 135–145)

## 2018-09-11 LAB — TSH: TSH: 0.408 u[IU]/mL (ref 0.350–4.500)

## 2018-09-11 MED ORDER — CARBIDOPA-LEVODOPA 25-100 MG PO TABS
1.0000 | ORAL_TABLET | Freq: Four times a day (QID) | ORAL | Status: DC
  Filled 2018-09-11: qty 1

## 2018-09-11 MED ORDER — DILTIAZEM HCL ER COATED BEADS 180 MG PO CP24
180.0000 mg | ORAL_CAPSULE | Freq: Once | ORAL | Status: AC
  Administered 2018-09-11: 180 mg via ORAL
  Filled 2018-09-11: qty 1

## 2018-09-11 MED ORDER — CARBIDOPA-LEVODOPA 25-100 MG PO TABS
1.0000 | ORAL_TABLET | Freq: Three times a day (TID) | ORAL | Status: DC
  Administered 2018-09-11: 1 via ORAL
  Filled 2018-09-11: qty 1

## 2018-09-11 MED ORDER — SODIUM CHLORIDE 0.9 % IV BOLUS
1000.0000 mL | Freq: Once | INTRAVENOUS | Status: AC
  Administered 2018-09-11: 1000 mL via INTRAVENOUS

## 2018-09-11 MED ORDER — RIVAROXABAN 20 MG PO TABS
20.0000 mg | ORAL_TABLET | Freq: Once | ORAL | Status: AC
  Administered 2018-09-11: 20 mg via ORAL
  Filled 2018-09-11: qty 1

## 2018-09-11 NOTE — ED Notes (Signed)
Stephanie Shaw (daughter)  [phone]

## 2018-09-11 NOTE — ED Notes (Signed)
Patient transported to CT 

## 2018-09-11 NOTE — ED Triage Notes (Signed)
Pt arrived [facility] from [facility] for c/o generalized weakness and loss of appetite x 2 days and reported deterioration over the last 2 weeks. Hx of Parkinsons and thinks she may need an increase in her medication. Pt has had multiple falls, last fall this morning on to her buttocks. Denies any neck or back pain at this time.  VSS 98/76 HR90 CBG106 96% RA T97.5

## 2018-09-11 NOTE — ED Provider Notes (Signed)
[facility] Provider Note   CSN: [id number] Arrival date & time: 09/11/18  2052     History   Chief Complaint Chief Complaint  Patient presents with  . Weakness    HPI Stephanie Shaw is a 78 y.o. female with history of Parkinson's disease and atrial fibrillation presenting to the ED complaining of worsening generalized weakness over the past 2 weeks.  Patient reports she has been tired lately and forgets to take her medications.  She reports that sometimes she feels more like napping than taking her medications or she will miss a meal and then not take them.  Patient knows the doses of her medications in the time that she is supposed to take them but states she just forgets to take them because she is so tired.  She reports she is currently in the independent living section of her facility and states that "they sent me here because they don't think I can take care of myself."  She denies any recent fever, chills, cough, shortness of breath, abdominal pain, nausea, vomiting, diarrhea, or any other complaints.  Spoke with patient's daughter, Stephanie Shaw, who stated that patient has been having a progressive decline over the past couple of months but states that her decline acutely exacerbated after a fall from bed 2 days ago.  She reports that staff at the facility told her that patient had fallen 2 additional times today and is having difficulty ambulating with her Stephanie Shaw, which she just recently began using.  Daughter reports she was told by staff that the patient seemed too weak to even feed herself today.     The history is provided by the patient.    Past Medical History:  Diagnosis Date  . Colles' fracture of right radius 03/05/2015  . DEGENERATIVE JOINT DISEASE, RIGHT HIP 02/16/2007  . Dupuytren's contracture   . Osteoarthritis of hip    right  . Osteoarthritis of left knee   . POSTMENOPAUSAL SYNDROME 02/16/2007  . PREMATURE ATRIAL CONTRACTIONS  02/16/2007  . S/P breast biopsy, left    two o'clock position - benign  . 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 Venous insufficiency     Patient Active Problem List   Diagnosis Date Noted  . Constipation 07/19/2018  . Bilateral lower extremity edema 07/19/2018  . 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 Unstable gait 02/16/2017  . Parkinson's disease ([place]) 11/26/2016  . Parkinsonism ([place]) 10/17/2016  . Fatigue 10/17/2016  . Long term (current) use of anticoagulants 05/29/2016  . Left wrist pain 01/03/2016  . Left hand pain 01/03/2016  . Vaginal atrophy 10/19/2015  . Tremor, unspecified 10/19/2015  . Osteopenia 10/19/2015  . 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 Closed fracture of part of upper end of humerus 05/01/2015  . Hematuria 02/04/2014  . 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 Cervical pain (neck) 06/20/2010  . Atrial fibrillation ([place]) 05/21/2010  . Menopausal and postmenopausal disorder 02/16/2007  . DEGENERATIVE JOINT DISEASE, RIGHT HIP 02/16/2007    Past Surgical History:  Procedure Laterality Date  . BREAST BIOPSY Left   . CATARACT EXTRACTION, BILATERAL       OB History    Gravida  3   Para  2   Term      Preterm      AB  1   Living  2     SAB  1   TAB      Ectopic  Multiple      Live Births               Home Medications    Prior to Admission medications   Medication Sig Start Date End Date Taking? Authorizing Provider  carbidopa-levodopa (SINEMET IR) 25-100 MG tablet 1 tablet 4 x a day.  Patient may take extra tablet if needed. Patient taking differently: 1 tablet 6 x a day.  Patient may take extra tablet if needed. 04/27/18   [name], [name], DO  cholecalciferol (VITAMIN D) 400 units TABS tablet Take 400 Units by mouth daily.    [provider]  diltiazem (CARDIZEM CD) 180 MG 24 hr capsule Take 1 capsule (180 mg total) by mouth daily. 03/12/18   [name], [name], MD  ferrous sulfate  325 (65 FE) MG EC tablet Take 325 mg by mouth 2 (two) times a week.     [provider]  rivaroxaban (XARELTO) 20 MG TABS tablet Take 1 tablet (20 mg total) by mouth daily. 04/08/18   [name], [name], MD  rosuvastatin (CRESTOR) 10 MG tablet Take 1 tablet (10 mg total) by mouth daily. 07/19/18   [name], [name], MD    Family History Family History  Problem Relation Age of Onset  . Cancer Father        ? type  . Alcohol abuse Father   . Cancer Mother        Pancreatic  . Diabetes Mother   . Heart failure Mother   . Osteoarthritis Sister   . Breast cancer Neg Hx     Social History Social History   Tobacco Use  . Smoking status: Former Smoker    Quit date: 08/27/1971    Years since quitting: 47.0  . Smokeless tobacco: Never Used  Substance Use Topics  . Alcohol use: No    Alcohol/week: 0.0 standard drinks  . Drug use: No     Allergies   Doxycycline and Sulfonamide derivatives   Review of Systems Review of Systems  Constitutional: Positive for fatigue. Negative for chills and fever.  HENT: Negative for ear pain and sore throat.   Eyes: Negative for pain and visual disturbance.  Respiratory: Negative for cough and shortness of breath.   Cardiovascular: Negative for chest pain and palpitations.  Gastrointestinal: Negative for abdominal pain and vomiting.  Genitourinary: Negative for dysuria and hematuria.  Musculoskeletal: Negative for arthralgias and back pain.  Skin: Negative for color change and rash.  Neurological: Positive for weakness. Negative for seizures and syncope.  All other systems reviewed and are negative.    Physical Exam Updated Vital Signs BP 114/81   Pulse 87   Temp 98 F (36.7 C) (Oral)   Resp (!) 21   SpO2 96%   Physical Exam Vitals signs and nursing note reviewed.  Constitutional:      Appearance: Normal appearance.     Comments: Cachectic  HENT:     Head: Normocephalic and atraumatic.     Nose: Nose normal. No congestion or  rhinorrhea.     Mouth/Throat:     Mouth: Mucous membranes are moist.     Pharynx: Oropharynx is clear. No oropharyngeal exudate or posterior oropharyngeal erythema.  Eyes:     Extraocular Movements: Extraocular movements intact.     Conjunctiva/sclera: Conjunctivae normal.     Pupils: Pupils are equal, round, and reactive to light.  Neck:     Musculoskeletal: Normal range of motion and neck supple. No neck rigidity or muscular  tenderness.  Cardiovascular:     Rate and Rhythm: Normal rate and regular rhythm.     Pulses: Normal pulses.     Heart sounds: Normal heart sounds. No murmur. No friction rub. No gallop.   Pulmonary:     Effort: Pulmonary effort is normal. No respiratory distress.     Breath sounds: Normal breath sounds. No stridor. No wheezing, rhonchi or rales.  Abdominal:     General: Abdomen is flat. There is no distension.     Palpations: Abdomen is soft.     Tenderness: There is no abdominal tenderness. There is no guarding or rebound.  Musculoskeletal: Normal range of motion.        General: No swelling, tenderness, deformity or signs of injury.  Skin:    General: Skin is warm and dry.  Neurological:     General: No focal deficit present.     Mental Status: She is alert and oriented to person, place, and time. Mental status is at baseline.     Cranial Nerves: No cranial nerve deficit.     Sensory: No sensory deficit.     Motor: No weakness.  Psychiatric:        Mood and Affect: Mood normal.        Behavior: Behavior normal.      ED Treatments / Results  Labs (all labs ordered are listed, but only abnormal results are displayed) Labs Reviewed  BASIC METABOLIC PANEL - Abnormal; Notable for the following components:      Result Value   BUN 29 (*)    GFR calc non Af Amer 55 (*)    All other components within normal limits  CBC WITH DIFFERENTIAL/PLATELET - Abnormal; Notable for the following components:   WBC 10.6 (*)    Neutro Abs 9.4 (*)    Lymphs Abs 0.5  (*)    All other components within normal limits  TSH  URINALYSIS, ROUTINE W REFLEX MICROSCOPIC    EKG EKG Interpretation  Date/Time:  Saturday September 11 2018 21:04:40 EDT Ventricular Rate:  99 PR Interval:    QRS Duration: 83 QT Interval:  383 QTC Calculation: 472 R Axis:   94 Text Interpretation:  Atrial fibrillation Ventricular premature complex Right axis deviation Low voltage, precordial leads Borderline ST elevation, lateral leads Artifact in lead(s) I III aVR aVL aVF V1 V2 V3 V4 Confirmed by Noemi Chapel [phone]) on 09/11/2018 11:05:22 PM   Radiology No results found.  Procedures Procedures (including critical care time)  Medications Ordered in ED Medications  sodium chloride 0.9 % bolus 1,000 mL (has no administration in time range)  carbidopa-levodopa (SINEMET IR) 25-100 MG per tablet immediate release 1 tablet (has no administration in time range)  diltiazem (CARDIZEM CD) 24 hr capsule 180 mg (180 mg Oral Given 09/11/18 2153)  rivaroxaban (XARELTO) tablet 20 mg (20 mg Oral Given 09/11/18 2153)     Initial Impression / Assessment and Plan / ED Course  I have reviewed the triage vital signs and the nursing notes.  Pertinent labs & imaging results that were available during my care of the patient were reviewed by me and considered in my medical decision making (see chart for details).        Stephanie Shaw is a 78 y.o. female with history of Parkinson's disease and atrial fibrillation presented to the ED complaining of worsening generalized weakness over the past 2 weeks.  On exam, patient has no focal neurologic deficits and has 5 out of 5  strength in all 4 extremities.  She is cachectic appearing.  BMP is unremarkable.  CBC shows very mildly elevated WBC at 10.6.  TSH within normal limits at 0.408.  Given patient's acute decline since fall from bed 2 days ago the setting of Xarelto use, will obtain CT head to rule out intracranial injury.  CT scan shows no  acute intracranial abnormality.  Patient was given her evening doses of Sinemet, Xarelto, and diltiazem.  Patient's weakness is likely secondary to progression of her Parkinson's disease and recent missed doses of her Sinemet.  Spoke with patient's facility, who is not able to offer patient increased resources without her being admitted and having an FL2 form.  Hospitalist was contacted for admission.  Patient will be admitted to the hospitalist service.   Final Clinical Impressions(s) / ED Diagnoses   Final diagnoses:  Generalized weakness  Parkinson disease [facility])    ED Discharge Orders    None       Candie Chroman, MD 09/12/18 [id number]    Noemi Chapel, MD 09/12/18 1455

## 2018-09-12 ENCOUNTER — Observation Stay (HOSPITAL_COMMUNITY): Payer: Medicare Other

## 2018-09-12 ENCOUNTER — Encounter (HOSPITAL_COMMUNITY): Payer: Self-pay | Admitting: Internal Medicine

## 2018-09-12 DIAGNOSIS — E860 Dehydration: Secondary | ICD-10-CM | POA: Diagnosis not present

## 2018-09-12 DIAGNOSIS — G20 Parkinson's disease: Secondary | ICD-10-CM | POA: Diagnosis not present

## 2018-09-12 DIAGNOSIS — I4891 Unspecified atrial fibrillation: Secondary | ICD-10-CM | POA: Diagnosis not present

## 2018-09-12 DIAGNOSIS — R531 Weakness: Secondary | ICD-10-CM

## 2018-09-12 HISTORY — DX: Weakness: R53.1

## 2018-09-12 LAB — CBC
HCT: 35.2 % — ABNORMAL LOW (ref 36.0–46.0)
Hemoglobin: 11.5 g/dL — ABNORMAL LOW (ref 12.0–15.0)
MCH: 30.8 pg (ref 26.0–34.0)
MCHC: 32.7 g/dL (ref 30.0–36.0)
MCV: 94.4 fL (ref 80.0–100.0)
Platelets: 150 10*3/uL (ref 150–400)
RBC: 3.73 MIL/uL — ABNORMAL LOW (ref 3.87–5.11)
RDW: 13 % (ref 11.5–15.5)
WBC: 8 10*3/uL (ref 4.0–10.5)
nRBC: 0 % (ref 0.0–0.2)

## 2018-09-12 LAB — URINALYSIS, ROUTINE W REFLEX MICROSCOPIC
Bilirubin Urine: NEGATIVE
Glucose, UA: NEGATIVE mg/dL
Ketones, ur: 20 mg/dL — AB
Nitrite: NEGATIVE
Protein, ur: >=300 mg/dL — AB
RBC / HPF: >50 RBC/hpf — ABNORMAL HIGH (ref 0–5)
Specific Gravity, Urine: 1.022 (ref 1.005–1.030)
WBC, UA: >50 WBC/hpf — ABNORMAL HIGH (ref 0–5)
pH: 8 (ref 5.0–8.0)

## 2018-09-12 LAB — CK: Total CK: 1900 U/L — ABNORMAL HIGH (ref 38–234)

## 2018-09-12 LAB — COMPREHENSIVE METABOLIC PANEL
ALT: 7 U/L (ref 0–44)
AST: 60 U/L — ABNORMAL HIGH (ref 15–41)
Albumin: 3.7 g/dL (ref 3.5–5.0)
Alkaline Phosphatase: 45 U/L (ref 38–126)
Anion gap: 11 (ref 5–15)
BUN: 27 mg/dL — ABNORMAL HIGH (ref 8–23)
CO2: 21 mmol/L — ABNORMAL LOW (ref 22–32)
Calcium: 9.2 mg/dL (ref 8.9–10.3)
Chloride: 108 mmol/L (ref 98–111)
Creatinine, Ser: 0.88 mg/dL (ref 0.44–1.00)
GFR calc Af Amer: >60 mL/min (ref >60)
GFR calc non Af Amer: >60 mL/min (ref >60)
Glucose, Bld: 89 mg/dL (ref 70–99)
Potassium: 3.6 mmol/L (ref 3.5–5.1)
Sodium: 140 mmol/L (ref 135–145)
Total Bilirubin: 1.1 mg/dL (ref 0.3–1.2)
Total Protein: 6.2 g/dL — ABNORMAL LOW (ref 6.5–8.1)

## 2018-09-12 LAB — SEDIMENTATION RATE: Sed Rate: 14 mm/hr (ref 0–22)

## 2018-09-12 LAB — VITAMIN B12: Vitamin B-12: 1217 pg/mL — ABNORMAL HIGH (ref 180–914)

## 2018-09-12 MED ORDER — ACETAMINOPHEN 650 MG RE SUPP: 650.0000 mg | Freq: Four times a day (QID) | RECTAL | Status: DC | PRN

## 2018-09-12 MED ORDER — ACETAMINOPHEN 500 MG PO TABS
1000.0000 mg | ORAL_TABLET | Freq: Once | ORAL | Status: AC
  Administered 2018-09-12: 1000 mg via ORAL
  Filled 2018-09-12: qty 2

## 2018-09-12 MED ORDER — ENOXAPARIN SODIUM 40 MG/0.4ML ~~LOC~~ SOLN: 40.0000 mg | SUBCUTANEOUS | Status: DC

## 2018-09-12 MED ORDER — ROSUVASTATIN CALCIUM 5 MG PO TABS
10.0000 mg | ORAL_TABLET | Freq: Every day | ORAL | Status: DC
  Administered 2018-09-12: 10 mg via ORAL
  Filled 2018-09-12: qty 2

## 2018-09-12 MED ORDER — SODIUM CHLORIDE 0.9 % IV SOLN
1.0000 g | INTRAVENOUS | Status: DC
  Administered 2018-09-12 – 2018-09-13 (×2): 1 g via INTRAVENOUS
  Filled 2018-09-12 (×2): qty 1
  Filled 2018-09-12: qty 10

## 2018-09-12 MED ORDER — DILTIAZEM HCL ER COATED BEADS 180 MG PO CP24
180.0000 mg | ORAL_CAPSULE | Freq: Every day | ORAL | Status: DC
  Administered 2018-09-13 – 2018-09-14 (×2): 180 mg via ORAL
  Filled 2018-09-12 (×3): qty 1

## 2018-09-12 MED ORDER — SODIUM CHLORIDE 0.9 % IV BOLUS
500.0000 mL | Freq: Once | INTRAVENOUS | Status: AC
  Administered 2018-09-12: 500 mL via INTRAVENOUS

## 2018-09-12 MED ORDER — CARBIDOPA-LEVODOPA 25-100 MG PO TABS
1.5000 | ORAL_TABLET | Freq: Four times a day (QID) | ORAL | Status: DC
  Administered 2018-09-12 – 2018-09-14 (×11): 1.5 via ORAL
  Filled 2018-09-12 (×11): qty 2

## 2018-09-12 MED ORDER — ACETAMINOPHEN 325 MG PO TABS
650.0000 mg | ORAL_TABLET | Freq: Four times a day (QID) | ORAL | Status: DC | PRN
  Administered 2018-09-12 – 2018-09-14 (×2): 650 mg via ORAL
  Filled 2018-09-12 (×2): qty 2

## 2018-09-12 MED ORDER — SODIUM CHLORIDE 0.9 % IV SOLN: INTRAVENOUS | Status: AC

## 2018-09-12 MED ORDER — SODIUM CHLORIDE 0.9 % IV SOLN
INTRAVENOUS | Status: AC
  Administered 2018-09-12: 14:00:00 via INTRAVENOUS

## 2018-09-12 MED ORDER — RIVAROXABAN 20 MG PO TABS
20.0000 mg | ORAL_TABLET | Freq: Every day | ORAL | Status: DC
  Administered 2018-09-12 – 2018-09-13 (×2): 20 mg via ORAL
  Filled 2018-09-12 (×2): qty 1

## 2018-09-12 MED ORDER — SODIUM CHLORIDE 0.9 % IV SOLN
INTRAVENOUS | Status: AC
  Administered 2018-09-12: 03:00:00 via INTRAVENOUS

## 2018-09-12 NOTE — Progress Notes (Addendum)
MEWS Guidelines - (patients age 78 and over)  Red - At High Risk for Deterioration Yellow - At risk for Deterioration  1. Go to room and assess patient 2. Validate data. Is this patient's baseline? If data confirmed: 3. Is this an acute change? 4. Administer prn meds/treatments as ordered. 5. Note Sepsis score 6. Review goals of care 7. Sports coach, RRT nurse and Provider. 8. Ask Provider to come to bedside.  9. Document patient condition/interventions/response. 10. Increase frequency of vital signs and focused assessments to at least q15 minutes x 4, then q30 minutes x2. - If stable, then q1h x3, then q4h x3 and then q8h or dept. routine. - If unstable, contact Provider & RRT nurse. Prepare for possible transfer. 11. Add entry in progress notes using the smart phrase ".MEWS". 1. Go to room and assess patient 2. Validate data. Is this patient's baseline? If data confirmed: 3. Is this an acute change? 4. Administer prn meds/treatments as ordered? 5. Note Sepsis score 6. Review goals of care 7. Sports coach and Provider 8. Call RRT nurse as needed. 9. Document patient condition/interventions/response. 10. Increase frequency of vital signs and focused assessments to at least q2h x2. - If stable, then q4h x2 and then q8h or dept. routine. - If unstable, contact Provider & RRT nurse. Prepare for possible transfer. 11. Add entry in progress notes using the smart phrase ".MEWS".  Green - Likely stable Lavender - Comfort Care Only  1. Continue routine/ordered monitoring.  2. Review goals of care. 1. Continue routine/ordered monitoring. 2. Review goals of care.    1227- Patient's BP 76/51, 1231 BP 80/57, notified Dr. Doristine Bosworth and received order for NS 533mL Bolus. Rapid response also notified. Patient is resting comfortably in bed no complaints at this time. Yellow MEWS protocol implemented. Will continue to monitor.  1335- 530mL bolus complete, BP 86/58. MD notified.

## 2018-09-12 NOTE — Evaluation (Signed)
Physical Therapy Evaluation Patient Details Name: Stephanie Shaw MRN: [id number] DOB: 02-08-1940 Today's Date: 09/12/2018   History of Present Illness  Patient is a 78 y/o female who presents with weakness and loss of appetite. PMH includes PD, paroxsymal A-fib.  Clinical Impression  Patient presents with pain, generalized weakness, impaired balance and impaired mobility s/p above. Pt from [place] and reports she has recently started using a RW for ambulation. Had 3 falls last week. Has been working with PT. Today, pt requires Min-Mod A for bed mobility, transfers and taking a few steps to [facility] and chair with use of RW. Noted to have impaired initiation, sequencing and slower speech/response time. Pt reports stiffness in BLEs and burning with urination. RN present and aware. Would benefit from SNF to maximize independence and mobility prior to return home. Will follow acutely.    Follow Up Recommendations SNF;Supervision for mobility/OOB;Supervision/Assistance - 24 hour    Equipment Recommendations  None recommended by PT    Recommendations for Other Services       Precautions / Restrictions Precautions Precautions: Fall Precaution Comments: multiple falls at home; PD Restrictions Weight Bearing Restrictions: No      Mobility  Bed Mobility Overal bed mobility: Needs Assistance Bed Mobility: Rolling;Sidelying to Sit Rolling: Min assist Sidelying to sit: Mod assist;HOB elevated       General bed mobility comments: Step by step cues; assist with BLEs and to reach for rail, assist with trunk. Very slow, impaired initation.  Transfers Overall transfer level: Needs assistance Equipment used: Rolling Tyna (2 wheeled) Transfers: Sit to/from [facility] Sit to Stand: Mod assist;From elevated surface Stand pivot transfers: Mod assist       General transfer comment: ASsist to power to standing from elevated bed height and from [facility] x2: SPT bed to [facility] with  Mod A of 2. SPT BSC to chair. Difficulty sequencing movements and initiating BLEs.  Ambulation/Gait                Stairs            Wheelchair Mobility    Modified Rankin (Stroke Patients Only)       Balance Overall balance assessment: History of Falls;Needs assistance Sitting-balance support: Feet supported;No upper extremity supported Sitting balance-Leahy Scale: Fair Sitting balance - Comments: Pt with forward flexed posture and kyphosis sitting.   Standing balance support: During functional activity Standing balance-Leahy Scale: Poor Standing balance comment: Reliant on BUEs for support in standing and external support.                             Pertinent Vitals/Pain Pain Assessment: Faces Faces Pain Scale: Hurts little more Pain Location: BLEs Pain Descriptors / Indicators: Cramping Pain Intervention(s): Repositioned;Monitored during session    Home Living Family/patient expects to be discharged to:: Other (Comment) Living Arrangements: Alone Available Help at Discharge: Available PRN/intermittently Type of Home: Independent living facility Home Access: Level entry     Home Layout: One level Home Equipment: Prisc[place] - 2 wheels;Shower seat      Prior Function Level of Independence: Needs assistance   Gait / Transfers Assistance Needed: Started using a RW ~1 month ago. Recent falls. Goes to therapy 3 times/week  ADL's / Homemaking Assistance Needed: Does own ADLs. Makes breakfast; has meals delivered for lunch/dinner.        Hand Dominance        Extremity/Trunk Assessment   Upper Extremity  Assessment Upper Extremity Assessment: Defer to OT evaluation    Lower Extremity Assessment Lower Extremity Assessment: Generalized weakness(tremulous movement BLEs)    Cervical / Trunk Assessment Cervical / Trunk Assessment: Kyphotic  Communication   Communication: No difficulties(slowed speech at times)  Cognition  Arousal/Alertness: Awake/alert Behavior During Therapy: WFL for tasks assessed/performed Overall Cognitive Status: Within Functional Limits for tasks assessed                                        General Comments      Exercises     Assessment/Plan    PT Assessment Patient needs continued PT services  PT Problem List Decreased strength;Decreased mobility;Decreased range of motion;Decreased activity tolerance;Pain;Decreased balance       PT Treatment Interventions Therapeutic activities;Gait training;Therapeutic exercise;Patient/family education;Balance training;Functional mobility training    PT Goals (Current goals can be found in the Care Plan section)  Acute Rehab PT Goals Patient Stated Goal: to get stronger PT Goal Formulation: With patient Time For Goal Achievement: 09/26/18 Potential to Achieve Goals: Fair    Frequency Min 3X/week   Barriers to discharge Decreased caregiver support      Co-evaluation               AM-PAC PT "6 Clicks" Mobility  Outcome Measure Help needed turning from your back to your side while in a flat bed without using bedrails?: A Little Help needed moving from lying on your back to sitting on the side of a flat bed without using bedrails?: A Lot Help needed moving to and from a bed to a chair (including a wheelchair)?: A Lot Help needed standing up from a chair using your arms (e.g., wheelchair or bedside chair)?: A Lot Help needed to walk in hospital room?: A Lot Help needed climbing 3-5 steps with a railing? : Total 6 Click Score: 12    End of Session Equipment Utilized During Treatment: Gait belt Activity Tolerance: Patient tolerated treatment well Patient left: in chair;with call bell/phone within reach;with chair alarm set;with nursing/sitter in room Nurse Communication: Mobility status PT Visit Diagnosis: Unsteadiness on feet (R26.81);Muscle weakness (generalized) (M62.81);Pain Pain - Right/Left:  (bil) Pain - part of body: Leg    Time: [id number] PT Time Calculation (min) (ACUTE ONLY): 50 min   Charges:   PT Evaluation $PT Eval Moderate Complexity: 1 Mod PT Treatments $Therapeutic Activity: 23-37 mins        Wray Kearns, [place], DPT Acute Rehabilitation Services Pager [phone] Office [place] 09/12/2018, 12:23 PM

## 2018-09-12 NOTE — Progress Notes (Signed)
Spoke with patient's daughter Baldo Ash 3 times today to provide updates. She is requesting a call from the doctor. Dr. Doristine Bosworth notified family is requesting a call back.

## 2018-09-12 NOTE — NC FL2 (Signed)
[place] MEDICAID FL2 LEVEL OF CARE SCREENING TOOL     IDENTIFICATION  Patient Name: Stephanie Shaw Birthdate: 03-30-1940 Sex: female Admission Date (Current Location): 09/11/2018  [facility] and [place] Number:  Herbalist and Address:  [facility], [place] [address], [place], [place] [address]      Provider Number: [id number]  Attending Physician Name and Address:  Darliss Cheney, MD  Relative Name and Phone Number:  Jahmya Moxley    Current Level of Care: Hospital Recommended Level of Care: [place] Prior Approval Number:    Date Approved/Denied: 09/12/18 PASRR Number: [id number]  Discharge Plan: SNF    Current Diagnoses: Patient Active Problem List   Diagnosis Date Noted  . Weakness 09/12/2018  . Dehydration 09/12/2018  . Constipation 07/19/2018  . Bilateral lower extremity edema 07/19/2018  . 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 Unstable gait 02/16/2017  . Parkinson's disease ([place]) 11/26/2016  . Parkinsonism ([place]) 10/17/2016  . Fatigue 10/17/2016  . Long term (current) use of anticoagulants 05/29/2016  . Left wrist pain 01/03/2016  . Left hand pain 01/03/2016  . Vaginal atrophy 10/19/2015  . Tremor, unspecified 10/19/2015  . Osteopenia 10/19/2015  . 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 Closed fracture of part of upper end of humerus 05/01/2015  . Hematuria 02/04/2014  . 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 Cervical pain (neck) 06/20/2010  . Atrial fibrillation ([place]) 05/21/2010  . Menopausal and postmenopausal disorder 02/16/2007  . DEGENERATIVE JOINT DISEASE, RIGHT HIP 02/16/2007    Orientation RESPIRATION BLADDER Height & Weight     Self, Time, Situation, Place  Normal Continent, External catheter Weight: 134 lb 0.6 oz (60.8 kg) Height:  5\' 8"  (172.7 cm)  BEHAVIORAL SYMPTOMS/MOOD NEUROLOGICAL BOWEL NUTRITION STATUS      Continent Diet(Regular diet, thin liquids)   AMBULATORY STATUS COMMUNICATION OF NEEDS Skin   Limited Assist   Bruising(bruising on back, buttocks, thigh; has dry skin as well)                       Personal Care Assistance Level of Assistance  Bathing, Feeding, Dressing, Total care Bathing Assistance: Limited assistance Feeding assistance: Independent Dressing Assistance: Limited assistance Total Care Assistance: Limited assistance   Functional Limitations Info  Sight, Hearing, Speech Sight Info: Adequate Hearing Info: Adequate Speech Info: Adequate    SPECIAL CARE FACTORS FREQUENCY  PT (By licensed PT)     PT Frequency: 3x/wk              Contractures Contractures Info: Not present    Additional Factors Info  Code Status, Allergies Code Status Info: Full Code Allergies Info: Doxycycline, Sulfonamide Derivatives           Current Medications (09/12/2018):  This is the current hospital active medication list Current Facility-Administered Medications  Medication Dose Route Frequency Provider Last Rate Last Dose  . 0.9 %  sodium chloride infusion   Intravenous Continuous Darliss Cheney, MD 125 mL/hr at 09/12/18 1001    . acetaminophen (TYLENOL) tablet 650 mg  650 mg Oral Q6H PRN Jani Gravel, MD       Or  . acetaminophen (TYLENOL) suppository 650 mg  650 mg Rectal Q6H PRN Jani Gravel, MD      . carbidopa-levodopa (SINEMET IR) 25-100 MG per tablet immediate release 1.5 tablet  1.5 tablet Oral QID Jani Gravel, MD   1.5 tablet at 09/12/18 1108  . diltiazem (CARDIZEM  CD) 24 hr capsule 180 mg  180 mg Oral Daily Jani Gravel, MD      . rivaroxaban Alveda Reasons) tablet 20 mg  20 mg Oral Q supper Bryk, Veronda P, RPH      . sodium chloride 0.9 % bolus 500 mL  500 mL Intravenous Once Darliss Cheney, MD         Discharge Medications: Please see discharge summary for a list of discharge medications.  Relevant Imaging Results:  Relevant Lab Results:   Additional Information SSN: [id number]  Philippa Chester Amily Depp,  LCSWA

## 2018-09-12 NOTE — Progress Notes (Signed)
Paged Jeannette Corpus regarding pt's IVF order which has expired. Pt's BP currently 92/60. Pulse 65. Ck elevated to 1900 this am . This info relayed to X. Blount. Asking for clarification- does IVF order need to be renewed or d/c'd?

## 2018-09-12 NOTE — Progress Notes (Signed)
ANTICOAGULATION CONSULT NOTE - Initial Consult  Pharmacy Consult for Xarelto Indication: atrial fibrillation  Allergies  Allergen Reactions  . Doxycycline Nausea And Vomiting  . Sulfonamide Derivatives     REACTION: HIVES    Patient Measurements: Height: 5\' 8"  (172.7 cm) Weight: 134 lb 0.6 oz (60.8 kg) IBW/kg (Calculated) : 63.9  Vital Signs: Temp: 98 F (36.7 C) (08/22 2057) Temp Source: Oral (08/22 2057) BP: 103/61 (08/23 0200) Pulse Rate: 79 (08/23 0200)  Labs: Recent Labs    09/11/18 2105  HGB 12.6  HCT 40.1  PLT 170  CREATININE 0.98    Estimated Creatinine Clearance: 45.4 mL/min (by C-G formula based on SCr of 0.98 mg/dL).   Medical History: Past Medical History:  Diagnosis Date  . Colles' fracture of right radius 03/05/2015  . DEGENERATIVE JOINT DISEASE, RIGHT HIP 02/16/2007  . Dupuytren's contracture   . Osteoarthritis of hip    right  . Osteoarthritis of left knee   . Paroxysmal A-fib ([place])   . POSTMENOPAUSAL SYNDROME 02/16/2007  . PREMATURE ATRIAL CONTRACTIONS 02/16/2007  . S/P breast biopsy, left    two o'clock position - benign  . 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 Venous insufficiency     Assessment: 78yo female admitted for further w/u of weakness, to continue Xarelto for Afib; of note SCr is up slightly from last records, putting pt at borderline renal function for typical Xarelto dosing.   Plan:  Will continue Xarelto 20mg  daily for now and consider dose reduction if renal function worsens.  Wynona Neat, PharmD, BCPS  09/12/2018,2:23 AM

## 2018-09-12 NOTE — ED Notes (Addendum)
ED TO INPATIENT HANDOFF REPORT  ED Nurse Name and Phone #:  Patty [phone]  S Name/Age/Gender Stephanie Shaw 78 y.o. female Room/Bed: 019C/019C  Code Status   Code Status: Not on file  Home/SNF/Other [place] Patient oriented to: self, place, time and situation Is this baseline? Yes   Triage Complete: Triage complete  Chief Complaint GEN WEAKNESS  Triage Note Pt arrived [facility] from [facility] for c/o generalized weakness and loss of appetite x 2 days and reported deterioration over the last 2 weeks. Hx of Parkinsons and thinks she may need an increase in her medication. Pt has had multiple falls, last fall this morning on to her buttocks. Denies any neck or back pain at this time.  VSS 98/76 HR90 CBG106 96% RA T97.5   Allergies Allergies  Allergen Reactions  . Doxycycline Nausea And Vomiting  . Sulfonamide Derivatives     REACTION: HIVES    Level of Care/Admitting Diagnosis ED Disposition    ED Disposition Condition Comment   Admit  Hospital Area: [place] [[id number]]  Level of Care: Med-Surg [16]  I expect the patient will be discharged within 24 hours: No (not a candidate for 5C-Observation unit)  Covid Evaluation: Asymptomatic Screening Protocol (No Symptoms)  Diagnosis: Weakness [id number]  Admitting Physician: Jani Gravel [phone]  Attending Physician: Jani Gravel [[id number]]  PT Class (Do Not Modify): Observation [104]  PT Acc Code (Do Not Modify): Observation [10022]       B Medical/Surgery History Past Medical History:  Diagnosis Date  . Colles' fracture of right radius 03/05/2015  . DEGENERATIVE JOINT DISEASE, RIGHT HIP 02/16/2007  . Dupuytren's contracture   . Osteoarthritis of hip    right  . Osteoarthritis of left knee   . POSTMENOPAUSAL SYNDROME 02/16/2007  . PREMATURE ATRIAL CONTRACTIONS 02/16/2007  . S/P breast biopsy, left    two o'clock position - benign  . 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 Venous  insufficiency    Past Surgical History:  Procedure Laterality Date  . BREAST BIOPSY Left   . CATARACT EXTRACTION, BILATERAL       A IV Location/Drains/Wounds Patient Lines/Drains/Airways Status   Active Line/Drains/Airways    Name:   Placement date:   Placement time:   Site:   Days:   Peripheral IV 09/11/18 Left Antecubital   09/11/18    2101    Antecubital   1          Intake/Output Last 24 hours  Intake/Output Summary (Last 24 hours) at 09/12/2018 0120 Last data filed at 09/12/2018 0109 Gross per 24 hour  Intake 1000 ml  Output -  Net 1000 ml    Labs/Imaging Results for orders placed or performed during the hospital encounter of 09/11/18 (from the past 48 hour(s))  Basic metabolic panel     Status: Abnormal   Collection Time: 09/11/18  9:05 PM  Result Value Ref Range   Sodium 139 135 - 145 mmol/L   Potassium 3.7 3.5 - 5.1 mmol/L   Chloride 106 98 - 111 mmol/L   CO2 22 22 - 32 mmol/L   Glucose, Bld 99 70 - 99 mg/dL   BUN 29 (H) 8 - 23 mg/dL   Creatinine, Ser 0.98 0.44 - 1.00 mg/dL   Calcium 9.9 8.9 - 10.3 mg/dL   GFR calc non Af Amer 55 (L) >60 mL/min   GFR calc Af Amer >60 >60 mL/min   Anion gap 11 5 - 15  Comment: Performed at [facility], [place] [address], [place], [place] [address]  CBC with Differential     Status: Abnormal   Collection Time: 09/11/18  9:05 PM  Result Value Ref Range   WBC 10.6 (H) 4.0 - 10.5 K/uL   RBC 4.16 3.87 - 5.11 MIL/uL   Hemoglobin 12.6 12.0 - 15.0 g/dL   HCT 40.1 36.0 - 46.0 %   MCV 96.4 80.0 - 100.0 fL   MCH 30.3 26.0 - 34.0 pg   MCHC 31.4 30.0 - 36.0 g/dL   RDW 13.1 11.5 - 15.5 %   Platelets 170 150 - 400 K/uL   nRBC 0.0 0.0 - 0.2 %   Neutrophils Relative % 89 %   Neutro Abs 9.4 (H) 1.7 - 7.7 K/uL   Lymphocytes Relative 4 %   Lymphs Abs 0.5 (L) 0.7 - 4.0 K/uL   Monocytes Relative 7 %   Monocytes Absolute 0.7 0.1 - 1.0 K/uL   Eosinophils Relative 0 %   Eosinophils Absolute 0.0 0.0 - 0.5 K/uL   Basophils Relative  0 %   Basophils Absolute 0.0 0.0 - 0.1 K/uL   Immature Granulocytes 0 %   Abs Immature Granulocytes 0.03 0.00 - 0.07 K/uL    Comment: Performed at [facility] [address], [place], [place] [address]  TSH     Status: None   Collection Time: 09/11/18  9:05 PM  Result Value Ref Range   TSH 0.408 0.350 - 4.500 uIU/mL    Comment: Performed by a 3rd Generation assay with a functional sensitivity of <=0.01 uIU/mL. Performed at [facility], [place] [address] [place], [place] [address]    Ct Head Wo Contrast  Result Date: 09/11/2018 CLINICAL DATA:  Head trauma, on anticoagulation EXAM: CT HEAD WITHOUT CONTRAST TECHNIQUE: Contiguous axial images were obtained from the base of the skull through the vertex without intravenous contrast. COMPARISON:  04/29/2015 FINDINGS: Brain: No acute intracranial abnormality. Specifically, no hemorrhage, hydrocephalus, mass lesion, acute infarction, or significant intracranial injury. Vascular: No hyperdense vessel or unexpected calcification. Skull: No acute calvarial abnormality. Sinuses/Orbits: Visualized paranasal sinuses and mastoids clear. Orbital soft tissues unremarkable. Other: None IMPRESSION: No acute intracranial abnormality. Electronically Signed   By: Rolm Baptise M.D.   On: 09/11/2018 23:53    Pending Labs Unresulted Labs (From admission, onward)    Start     Ordered   09/12/18 0043  Novel Coronavirus, NAA (send-out to ref lab)  (Asymptomatic/Tier 2 Patients Labs)  Once,   STAT    Question Answer Comment  Is this test for diagnosis or screening Screening   Symptomatic for COVID-19 as defined by CDC No   Hospitalized for COVID-19 No   Admitted to ICU for COVID-19 No   Previously tested for COVID-19 No   Resident in a congregate (group) care setting No   Employed in healthcare setting No   Pregnant No      09/12/18 0042   09/11/18 2124  Urinalysis, Routine w reflex microscopic  ONCE - STAT,   STAT     09/11/18 2128           Vitals/Pain Today's Vitals   09/11/18 2300 09/11/18 2315 09/11/18 2330 09/11/18 2358  BP: 103/62 100/66 99/68   Pulse: (!) 102 (!) 101 96   Resp: 16 17 (!) 21   Temp:      TempSrc:      SpO2: 95% 95% 97%   PainSc:  9     Isolation Precautions No active isolations  Medications Medications  diltiazem (CARDIZEM CD) 24 hr capsule 180 mg (180 mg Oral Given 09/11/18 2153)  rivaroxaban (XARELTO) tablet 20 mg (20 mg Oral Given 09/11/18 2153)  sodium chloride 0.9 % bolus 1,000 mL (0 mLs Intravenous Stopped 09/12/18 0109)  acetaminophen (TYLENOL) tablet 1,000 mg (1,000 mg Oral Given 09/12/18 0019)    Mobility walks with device High fall risk   Focused Assessments    R Recommendations: See Admitting Provider Note  Report given to:   Additional Notes:

## 2018-09-12 NOTE — ED Notes (Signed)
Daughter called- [phone] for an update

## 2018-09-12 NOTE — H&P (Addendum)
TRH H&P    Patient Demographics:    Stephanie Shaw, is a 78 y.o. female  MRN: [id number]  DOB - 14-Jan-1941  Admit Date - 09/11/2018  Referring MD/NP/PA:  Candie Chroman  Outpatient Primary MD for the patient is [name], [name], MD  Patient coming from:  [place] [facility]  Chief complaint-  weakness   HPI:    Stephanie Shaw  is a 78 y.o. female, w Parkinsons disease, Osteoarthritis, Paroxysmal Afib, apparently presents with decrease in oral intake and generalized weakness. Pt is typically able to walk, but is no longer able to walk. Pt is presently in independent living at [facility] and requires FL2 to get into higher level or care.  Pt does note intermittent neck and back pain.  No radiation of pain.  Denies numbness, tingling.   Pt denies fever, chills, cough, cp, palp, sob, n/v, abd pain, diarrhea, brbpr, black stool, dysuria, hematuria.    In ED,  T 98 P 97 R 20 Bp 115/77 pox 96% on RA  CT brain IMPRESSION: No acute intracranial abnormality.  Na 139, K 3.7, Bun 29, Creatinine 0.98 Wbc 10.6 Hgb 12.6, Plt 170 Tsh 0.408  Urinalysis pending  Pt will be admitted for generalized weakness and inability to walk.     Review of systems:    In addition to the HPI above,  No Fever-chills, No Headache, No changes with Vision or hearing, No problems swallowing food or Liquids, No Chest pain, Cough or Shortness of Breath, No Abdominal pain, No Nausea or Vomiting, bowel movements are regular, No Blood in stool or Urine, No dysuria, No new skin rashes or bruises, No new joints pains-aches,   No recent weight gain or loss, No polyuria, polydypsia or polyphagia, No significant Mental Stressors.  All other systems reviewed and are negative.    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  type   Alcohol abuse Father    Cancer Mother        Pancreatic   Diabetes Mother    Heart failure Mother    Osteoarthritis Sister    Breast cancer Neg Hx        Home Medications:   Prior to Admission medications   Medication Sig Start Date End Date Taking? Authorizing Provider  carbidopa-levodopa (SINEMET IR) 25-100 MG tablet 1 tablet 4 x a day.  Patient may take extra tablet if needed. Patient taking differently: 1 tablet 6 x a day.  Patient may take extra tablet if needed. 04/27/18   [name], [name], DO  cholecalciferol (VITAMIN D) 400 units TABS tablet Take 400 Units by mouth daily.    [provider]  diltiazem (CARDIZEM CD) 180 MG 24 hr capsule Take 1 capsule (180 mg total) by mouth daily. 03/12/18   [name], [name], MD  ferrous sulfate 325 (65 FE) MG EC tablet Take 325 mg by mouth 2 (two) times a week.     [provider]  rivaroxaban (XARELTO) 20 MG TABS tablet Take 1 tablet (20 mg total) by mouth daily. 04/08/18   [name], [name], MD  rosuvastatin (CRESTOR) 10 MG tablet Take 1 tablet (10 mg total) by mouth daily. 07/19/18    [name], [name], MD     Allergies:     Allergies  Allergen Reactions   Doxycycline Nausea And Vomiting   Sulfonamide Derivatives     REACTION: HIVES     Physical Exam:   Vitals  Blood pressure 99/68, pulse 96, temperature 98 F (36.7 C), temperature source Oral, resp. rate (!) 21, SpO2 97 %.  1.  General: axoxo3  2. Psychiatric: Euthymic  3. Neurologic: Barely able to move the right leg 5-/5. Pt is unable to move the left leg to any significant extent, can wiggle her toes Pt can move her left arm,  Was not able to see if can move right arm.  + bradykinesia, speech fluent  4. HEENMT:  Anicteric, pupils 1.46m symmetric, direct, consensual, near intact Neck: no jvd  5. Respiratory : CTAB  6. Cardiovascular : Irr, irr, s1, s2  7. Gastrointestinal:  Abd: soft, nt, nd, +bs  8. Skin:  Ext: no c/c/,  Trace pedal edema,  onychomycosis + venous stasis changes 9.Musculoskeletal:  Good ROM    Data Review:    CBC Recent Labs  Lab 09/11/18 2105  WBC 10.6*  HGB 12.6  HCT 40.1  PLT 170  MCV 96.4  MCH 30.3  MCHC 31.4  RDW 13.1  LYMPHSABS 0.5*  MONOABS 0.7  EOSABS 0.0  BASOSABS 0.0   ------------------------------------------------------------------------------------------------------------------  Results for orders placed or performed during the hospital encounter of 09/11/18 (from the past 48 hour(s))  Basic metabolic panel     Status: Abnormal   Collection Time: 09/11/18  9:05 PM  Result Value Ref Range   Sodium 139 135 - 145 mmol/L   Potassium 3.7 3.5 - 5.1 mmol/L   Chloride 106 98 - 111 mmol/L   CO2 22 22 - 32 mmol/L   Glucose, Bld 99 70 - 99 mg/dL   BUN 29 (H) 8 - 23 mg/dL   Creatinine, Ser 0.98 0.44 - 1.00 mg/dL   Calcium 9.9 8.9 - 10.3 mg/dL   GFR calc non Af Amer 55 (L) >60 mL/min   GFR calc Af Amer >60 >60 mL/min   Anion gap 11 5 - 15    Comment: Performed at  [facility], [place] [address], [place], [place] [address]  CBC with  Differential     Status: Abnormal   Collection Time: 09/11/18  9:05 PM  Result Value Ref Range   WBC 10.6 (H) 4.0 - 10.5 K/uL   RBC 4.16 3.87 - 5.11 MIL/uL   Hemoglobin 12.6 12.0 - 15.0 g/dL   HCT 40.1 36.0 - 46.0 %   MCV 96.4 80.0 - 100.0 fL   MCH 30.3 26.0 - 34.0 pg   MCHC 31.4 30.0 - 36.0 g/dL   RDW 13.1 11.5 - 15.5 %   Platelets 170 150 - 400 K/uL   nRBC 0.0 0.0 - 0.2 %   Neutrophils Relative % 89 %   Neutro Abs 9.4 (H) 1.7 - 7.7 K/uL   Lymphocytes Relative 4 %   Lymphs Abs 0.5 (L) 0.7 - 4.0 K/uL   Monocytes Relative 7 %   Monocytes Absolute 0.7 0.1 - 1.0 K/uL   Eosinophils Relative 0 %   Eosinophils Absolute 0.0 0.0 - 0.5 K/uL   Basophils Relative 0 %   Basophils Absolute 0.0 0.0 - 0.1 K/uL   Immature Granulocytes 0 %   Abs Immature Granulocytes 0.03 0.00 - 0.07 K/uL    Comment: Performed at [facility] [address], [place], [place] [address]  TSH     Status: None   Collection Time: 09/11/18  9:05 PM  Result Value Ref Range   TSH 0.408 0.350 - 4.500 uIU/mL    Comment: Performed by a 3rd Generation assay with a functional sensitivity of <=0.01 uIU/mL. Performed at [facility], [place] [address] [place],  [address]     Chemistries  Recent Labs  Lab 09/11/18 2105  NA 139  K 3.7  CL 106  CO2 22  GLUCOSE 99  BUN 29*  CREATININE 0.98  CALCIUM 9.9   ------------------------------------------------------------------------------------------------------------------  ------------------------------------------------------------------------------------------------------------------ GFR: CrCl cannot be calculated (Unknown ideal weight.). Liver Function Tests: No results for input(s): AST, ALT, ALKPHOS, BILITOT, PROT, ALBUMIN in the last 168 hours. No results for input(s): LIPASE, AMYLASE in the last 168 hours. No results for input(s): AMMONIA in the last 168 hours. Coagulation Profile: No results for input(s): INR, PROTIME in the last 168  hours. Cardiac Enzymes: No results for input(s): CKTOTAL, CKMB, CKMBINDEX, TROPONINI in the last 168 hours. BNP (last 3 results) No results for input(s): PROBNP in the last 8760 hours. HbA1C: No results for input(s): HGBA1C in the last 72 hours. CBG: No results for input(s): GLUCAP in the last 168 hours. Lipid Profile: No results for input(s): CHOL, HDL, LDLCALC, TRIG, CHOLHDL, LDLDIRECT in the last 72 hours. Thyroid Function Tests: Recent Labs    09/11/18 2105  TSH 0.408   Anemia Panel: No results for input(s): VITAMINB12, FOLATE, FERRITIN, TIBC, IRON, RETICCTPCT in the last 72 hours.  --------------------------------------------------------------------------------------------------------------- Urine analysis:    Component Value Date/Time   COLORURINE YELLOW 08/12/2018 1100   APPEARANCEUR Cloudy (A) 08/12/2018 1100   LABSPEC 1.020 08/12/2018 1100   PHURINE 6.0 08/12/2018 1100   GLUCOSEU NEGATIVE 08/12/2018 1100   HGBUR MODERATE (A) 08/12/2018 1100   HGBUR negative 02/09/2007 0801   BILIRUBINUR NEGATIVE 08/12/2018 1100   BILIRUBINUR negative 03/19/2017 1559   KETONESUR TRACE (A) 08/12/2018 1100   PROTEINUR negative 03/19/2017 1559   UROBILINOGEN 0.2 08/12/2018 1100   NITRITE NEGATIVE 08/12/2018 1100   LEUKOCYTESUR MODERATE (A) 08/12/2018 1100      Imaging Results:    Ct Head Wo Contrast  Result Date: 09/11/2018 CLINICAL DATA:  Head trauma, on anticoagulation EXAM: CT HEAD WITHOUT CONTRAST TECHNIQUE: Contiguous axial images were obtained from the base of the skull through the vertex without intravenous contrast. COMPARISON:  04/29/2015 FINDINGS: Brain: No acute intracranial abnormality. Specifically, no hemorrhage, hydrocephalus, mass lesion, acute infarction, or significant intracranial injury. Vascular: No hyperdense vessel or unexpected calcification. Skull: No acute calvarial abnormality. Sinuses/Orbits: Visualized paranasal sinuses and mastoids clear. Orbital soft  tissues unremarkable. Other: None IMPRESSION: No acute intracranial abnormality. Electronically Signed   By: Rolm Baptise M.D.   On: 09/11/2018 23:53   Afib at 100, nl axis, no st-t changes c/w ischemia   Assessment & Plan:    Principal Problem:   Weakness Active Problems:   Atrial fibrillation (HCC)   Parkinson's disease (HCC)   Dehydration  Weakness legs Check esr, cpk, b12, acetylcholine ab. (tsh already checked and wnl) Check MRI brain Check MRI L spine Check CXR Urinalysis pending PT/OT to evaluate and tx Social work consult for placement Consider neurology consult if not improving  Neck pain w right arm weakness Check MRI C spine  Dehydration Hydrate with ns iv Check cmp in am  Parkinsons Cont Sinemet  Pafib Cont Cardizem CD 174m po qday Cont Xarelto, pharmacy to dose   Hyperlipidemia Cont Crestor 169mpo qhs   DVT Prophylaxis-   Lovenox - SCDs   AM Labs Ordered, also please review Full Orders  Family Communication: Admission, patients condition and plan of care including tests being ordered have been discussed with the patient  who indicate understanding and agree with the plan and Code Status.  Code Status:  FULL CODE,  [facility]   Admission status: Observation: Based on patients clinical presentation and evaluation of above clinical data, I have made determination that patient meets observation criteria at this time. May require change to inpatient if patient not improving.   Time spent in minutes : 55    JaJani Gravel.D on 09/12/2018 at 1:54 AM

## 2018-09-12 NOTE — Plan of Care (Signed)
  Problem: Safety: Goal: Ability to remain free from injury will improve Outcome: Progressing   

## 2018-09-12 NOTE — ED Notes (Signed)
Pt reports she is too weak to stand and ambulate with the assistance of two health care providers and a Dorotha.

## 2018-09-12 NOTE — Progress Notes (Addendum)
PROGRESS NOTE    Stephanie Shaw  [id number] DOB: 1940-03-20 DOA: 09/11/2018 PCP: [name], [name], MD   Brief Narrative:  Stephanie Shaw  is a 78 y.o. female, w Parkinsons disease, Osteoarthritis, Paroxysmal Afib, presented with decrease in oral intake and generalized weakness. Pt is typically able to walk using [place], but is no longer able to walk. Pt is presently in independent living at [facility] and requires FL2 to get into higher level or care.  No fever, chills, cough, cp, palp, sob, n/v, abd pain, diarrhea, brbpr, black stool, dysuria, hematuria.    She was hemodynamically stable in the emergency department.  CT head unremarkable.  Patient was admitted under hospitalist service for generalized weakness.  She was then found to have elevated CK level making the diagnosis of possible rhabdomyolysis.  She tells me that she also fell on Friday and was on the floor for approximately 30 minutes.  She has chronic hip and low back pain but nothing acute since that fall.  Despite of falling, she went to have her physical therapy that day which she gets typically 3 times a week.]  Assessment & Plan:   Principal Problem:   Weakness Active Problems:   Atrial fibrillation (HCC)   Parkinson's disease ([place])   Dehydration   Fall/generalized weakness: To be evaluated by PT OT.  Mild rhabdomyolysis/nontraumatic: CK elevated up to 2000.  Minimal trauma on Friday.  No bruise on the body exam.  No pain that is acute.  Will increase IV fluids of normal saline at 125 cc/h and repeat CK level in the morning.  Parkinson's disease: Continue home medications.  Neck pain: Did not complain of neck pain to me or any right arm weakness.  Cervical spine MRI negative for any acute pathology.  Paroxysmal atrial fibrillation: Can he needs to be in sinus rhythm.  Continue home dose of Cardizem along with Xarelto.  Hyperlipidemia: We will hold Crestor for some time due to rhabdomyolysis.  Dysuria: Checking  UA.  DVT prophylaxis: Xarelto Code Status: Full code Family Communication:  None present at bedside.  Plan of care discussed with patient in length and he verbalized understanding and agreed with it. Disposition Plan: Potentially discharge tomorrow to either home or SNF  Consultants:   None  Procedures:   None  Antimicrobials:   None   Subjective: Patient seen and examined.  She was sitting on the commode stooping forward.  Complains of burning urination.  Chronic low back pain.  No other complaint.  Upon further questioning, she tells me that she fell on Friday from her bed and laid on the floor for 30 minutes before she called for help.  No trauma that she recalls and no acute pain.  I did explain to her that she may have rhabdomyolysis.  She is very sharp lady.  She wants to do some research and read up on rhabdomyolysis.  Objective: Vitals:   09/12/18 0145 09/12/18 0200 09/12/18 0244 09/12/18 0955  BP: 104/74 103/61 104/76 92/64  Pulse: 94 79 79 81  Resp: 14 15 14    Temp:   97.6 F (36.4 C)   TempSrc:   Oral   SpO2: 96% 97% 98%   Weight:      Height:        Intake/Output Summary (Last 24 hours) at 09/12/2018 1017 Last data filed at 09/12/2018 0956 Gross per 24 hour  Intake 1118 ml  Output 150 ml  Net 968 ml   [employer]  09/12/18 0100  Weight: 60.8 kg    Examination:  General exam: Appears calm and comfortable  Respiratory system: Clear to auscultation. Respiratory effort normal. Cardiovascular system: S1 & S2 heard, RRR. No JVD, murmurs, rubs, gallops or clicks. No pedal edema. Gastrointestinal system: Abdomen is nondistended, soft and nontender. No organomegaly or masses felt. Normal bowel sounds heard. Central nervous system: Alert and oriented. No focal neurological deficits. Extremities: Symmetric 5 x 5 power. Skin: No rashes, lesions or ulcers Psychiatry: Judgement and insight appear normal. Mood & affect appropriate.    Data Reviewed: I have  personally reviewed following labs and imaging studies  CBC: Recent Labs  Lab 09/11/18 2105 09/12/18 0304  WBC 10.6* 8.0  NEUTROABS 9.4*  --   HGB 12.6 11.5*  HCT 40.1 35.2*  MCV 96.4 94.4  PLT 170 [id number]   Basic Metabolic Panel: Recent Labs  Lab 09/11/18 2105 09/12/18 0304  NA 139 140  K 3.7 3.6  CL 106 108  CO2 22 21*  GLUCOSE 99 89  BUN 29* 27*  CREATININE 0.98 0.88  CALCIUM 9.9 9.2   GFR: Estimated Creatinine Clearance: 50.6 mL/min (by C-G formula based on SCr of 0.88 mg/dL). Liver Function Tests: Recent Labs  Lab 09/12/18 0304  AST 60*  ALT 7  ALKPHOS 45  BILITOT 1.1  PROT 6.2*  ALBUMIN 3.7   No results for input(s): LIPASE, AMYLASE in the last 168 hours. No results for input(s): AMMONIA in the last 168 hours. Coagulation Profile: No results for input(s): INR, PROTIME in the last 168 hours. Cardiac Enzymes: Recent Labs  Lab 09/12/18 0304  CKTOTAL 1,900*   BNP (last 3 results) No results for input(s): PROBNP in the last 8760 hours. HbA1C: No results for input(s): HGBA1C in the last 72 hours. CBG: No results for input(s): GLUCAP in the last 168 hours. Lipid Profile: No results for input(s): CHOL, HDL, LDLCALC, TRIG, CHOLHDL, LDLDIRECT in the last 72 hours. Thyroid Function Tests: Recent Labs    09/11/18 2105  TSH 0.408   Anemia Panel: Recent Labs    09/12/18 0304  VITAMINB12 1,217*   Sepsis Labs: No results for input(s): PROCALCITON, LATICACIDVEN in the last 168 hours.  No results found for this or any previous visit (from the past 240 hour(s)).    Radiology Studies: Ct Head Wo Contrast  Result Date: 09/11/2018 CLINICAL DATA:  Head trauma, on anticoagulation EXAM: CT HEAD WITHOUT CONTRAST TECHNIQUE: Contiguous axial images were obtained from the base of the skull through the vertex without intravenous contrast. COMPARISON:  04/29/2015 FINDINGS: Brain: No acute intracranial abnormality. Specifically, no hemorrhage, hydrocephalus, mass  lesion, acute infarction, or significant intracranial injury. Vascular: No hyperdense vessel or unexpected calcification. Skull: No acute calvarial abnormality. Sinuses/Orbits: Visualized paranasal sinuses and mastoids clear. Orbital soft tissues unremarkable. Other: None IMPRESSION: No acute intracranial abnormality. Electronically Signed   By: Rolm Baptise M.D.   On: 09/11/2018 23:53   Mr Cervical Spine Wo Contrast  Result Date: 09/12/2018 CLINICAL DATA:  Neck pain, initial exam Radiculopathy neck pain, weakness in legs, inability to walk. Intermittent neck pain. Personal history of Parkinson's. EXAM: MRI CERVICAL SPINE WITHOUT CONTRAST TECHNIQUE: Multiplanar, multisequence MR imaging of the cervical spine was performed. No intravenous contrast was administered. COMPARISON:  CT of the cervical spine 06/17/2010 FINDINGS: Alignment: Exaggerated cervical lordosis is present. There is slight anterolisthesis at C4-5 and C7-T1. No other significant listhesis is present in the cervical spine. Vertebrae: Mild endplate marrow changes are present at C5-6. Marrow signal  and vertebral body heights are otherwise normal. Skull base is normal. Cord: Normal signal is present in the cervical and upper thoracic spinal cord to the lowest imaged level, T3-4. Posterior Fossa, vertebral arteries, paraspinal tissues: Craniocervical junction is normal. Flow is present in the vertebral arteries bilaterally. Visualized intracranial contents are normal. Disc levels: C2-3: Negative. C3-4: Asymmetric right-sided facet hypertrophy is present. There is no significant stenosis. C4-5: There is mild facet hypertrophy bilaterally without significant stenosis. C5-6: A broad-based disc osteophyte complex is present. Bilateral facet hypertrophy is noted. Central canal is patent. Moderate foraminal narrowing is worse right than left due to uncovertebral disease. C6-7: Minimal left-sided uncovertebral spurring is present without significant  stenosis. C7-T1: Negative. IMPRESSION: 1. No acute or focal lesion to explain inability to walk weakness in legs. 2. Chronic loss of disc height and endplate degenerative change at C5-6 with moderate foraminal narrowing, right greater than left, secondary to uncovertebral and facet disease. 3. No significant central canal stenosis. 4. No other significant foraminal narrowing in the cervical spine. Electronically Signed   By: San Morelle M.D.   On: 09/12/2018 07:32   Mr Lumbar Spine Wo Contrast  Result Date: 09/12/2018 CLINICAL DATA:  Back pain, > 6wks conservative tx, persistent sx back pain, weakness of the legs, inability to walk. EXAM: MRI LUMBAR SPINE WITHOUT CONTRAST TECHNIQUE: Multiplanar, multisequence MR imaging of the lumbar spine was performed. No intravenous contrast was administered. COMPARISON:  None. FINDINGS: Segmentation: 5 non rib-bearing lumbar type vertebral bodies are present. The lowest fully formed vertebral body is L5. Alignment: Normal lumbar lordosis is present. There is no significant listhesis or scoliosis. Vertebrae:  Marrow signal and vertebral body heights are normal. Conus medullaris and cauda equina: Conus extends to the T12 level. Conus and cauda equina appear normal. Paraspinal and other soft tissues: A 9 mm simple cyst is present in the medial aspect of the right kidney. A 3.5 cm right adnexal cyst is present. The urinary bladder is mildly distended. Disc levels: T12-L1: Negative. L1-2: Negative. L2-3: Mild facet hypertrophy is present bilaterally. There is some disc bulging. No significant stenosis is present. L3-4: Moderate facet hypertrophy is present. There is mild disc bulging without significant stenosis. L4-5: Mild facet hypertrophy is present bilaterally. There is no focal disc protrusion or stenosis. L5-S1: Mild facet hypertrophy is present bilaterally. There is no focal disc protrusion or stenosis. IMPRESSION: 1. Mild moderate facet hypertrophy in the lumbar  spine without significant disc disease or focal stenosis to explain the patient's inability to walk or lower extremity weakness. 2. 3.5 cm right adnexal cyst. This lesion. Benign. Recommend follow-up ultrasound at 6-12 months for further evaluation. This recommendation follows ACR consensus guidelines: White Paper of the ACR Incidental Findings Committee II on Adnexal Findings. J Am Coll Radiol [phone]. Electronically Signed   By: San Morelle M.D.   On: 09/12/2018 08:00   Dg Chest Port 1 View  Result Date: 09/12/2018 CLINICAL DATA:  Weakness. EXAM: PORTABLE CHEST 1 VIEW COMPARISON:  09/17/2016 FINDINGS: Stable cardiomegaly. Unchanged mediastinal contours with aortic atherosclerosis. Biapical pleuroparenchymal scarring. No acute airspace disease, pulmonary edema, pleural fluid or pneumothorax. EKG lead overlies the chest. No acute osseous abnormalities are seen. IMPRESSION: Stable cardiomegaly without acute abnormality. Aortic Atherosclerosis (ICD10-I70.0). Electronically Signed   By: Keith Rake M.D.   On: 09/12/2018 03:24    Scheduled Meds:  carbidopa-levodopa  1.5 tablet Oral QID   diltiazem  180 mg Oral Daily   rivaroxaban  20 mg Oral Q supper  rosuvastatin  10 mg Oral Daily   Continuous Infusions:  sodium chloride 125 mL/hr at 09/12/18 1001     LOS: 0 days   Time spent: 32 minutes   Darliss Cheney, MD [facility] Pager [phone]  If 7PM-7AM, please contact night-coverage [email or web address] Password [facility] 09/12/2018, 10:17 AM

## 2018-09-12 NOTE — Progress Notes (Signed)
Patient's daughter called with many questions about patient's treatment. Gave her all the information available to me. She has called multiple times today requesting to speak with physician. Day RN paged MD x3. Pt's daughter awaitng call from physician.

## 2018-09-13 ENCOUNTER — Observation Stay (HOSPITAL_COMMUNITY): Payer: Medicare Other

## 2018-09-13 ENCOUNTER — Telehealth: Payer: Self-pay | Admitting: Neurology

## 2018-09-13 DIAGNOSIS — Z7901 Long term (current) use of anticoagulants: Secondary | ICD-10-CM | POA: Diagnosis not present

## 2018-09-13 DIAGNOSIS — Z20828 Contact with and (suspected) exposure to other viral communicable diseases: Secondary | ICD-10-CM | POA: Diagnosis present

## 2018-09-13 DIAGNOSIS — M6282 Rhabdomyolysis: Secondary | ICD-10-CM | POA: Diagnosis present

## 2018-09-13 DIAGNOSIS — W06XXXA Fall from bed, initial encounter: Secondary | ICD-10-CM | POA: Diagnosis present

## 2018-09-13 DIAGNOSIS — Z87891 Personal history of nicotine dependence: Secondary | ICD-10-CM | POA: Diagnosis not present

## 2018-09-13 DIAGNOSIS — M720 Palmar fascial fibromatosis [Dupuytren]: Secondary | ICD-10-CM | POA: Diagnosis present

## 2018-09-13 DIAGNOSIS — Z880 Allergy status to penicillin: Secondary | ICD-10-CM | POA: Diagnosis not present

## 2018-09-13 DIAGNOSIS — G20 Parkinson's disease: Secondary | ICD-10-CM | POA: Diagnosis present

## 2018-09-13 DIAGNOSIS — I82409 Acute embolism and thrombosis of unspecified deep veins of unspecified lower extremity: Secondary | ICD-10-CM | POA: Diagnosis not present

## 2018-09-13 DIAGNOSIS — M545 Low back pain: Secondary | ICD-10-CM | POA: Diagnosis present

## 2018-09-13 DIAGNOSIS — Z79899 Other long term (current) drug therapy: Secondary | ICD-10-CM | POA: Diagnosis not present

## 2018-09-13 DIAGNOSIS — I491 Atrial premature depolarization: Secondary | ICD-10-CM | POA: Diagnosis present

## 2018-09-13 DIAGNOSIS — I872 Venous insufficiency (chronic) (peripheral): Secondary | ICD-10-CM | POA: Diagnosis present

## 2018-09-13 DIAGNOSIS — M542 Cervicalgia: Secondary | ICD-10-CM | POA: Diagnosis present

## 2018-09-13 DIAGNOSIS — R531 Weakness: Secondary | ICD-10-CM | POA: Diagnosis not present

## 2018-09-13 DIAGNOSIS — S52531A Colles' fracture of right radius, initial encounter for closed fracture: Secondary | ICD-10-CM | POA: Diagnosis present

## 2018-09-13 DIAGNOSIS — M1611 Unilateral primary osteoarthritis, right hip: Secondary | ICD-10-CM | POA: Diagnosis present

## 2018-09-13 DIAGNOSIS — R071 Chest pain on breathing: Secondary | ICD-10-CM | POA: Diagnosis present

## 2018-09-13 DIAGNOSIS — E785 Hyperlipidemia, unspecified: Secondary | ICD-10-CM | POA: Diagnosis present

## 2018-09-13 DIAGNOSIS — R748 Abnormal levels of other serum enzymes: Secondary | ICD-10-CM | POA: Diagnosis present

## 2018-09-13 DIAGNOSIS — Z882 Allergy status to sulfonamides status: Secondary | ICD-10-CM | POA: Diagnosis not present

## 2018-09-13 DIAGNOSIS — I480 Paroxysmal atrial fibrillation: Secondary | ICD-10-CM | POA: Diagnosis present

## 2018-09-13 DIAGNOSIS — M1712 Unilateral primary osteoarthritis, left knee: Secondary | ICD-10-CM | POA: Diagnosis present

## 2018-09-13 DIAGNOSIS — G8929 Other chronic pain: Secondary | ICD-10-CM | POA: Diagnosis present

## 2018-09-13 DIAGNOSIS — N390 Urinary tract infection, site not specified: Secondary | ICD-10-CM | POA: Diagnosis present

## 2018-09-13 DIAGNOSIS — E860 Dehydration: Secondary | ICD-10-CM | POA: Diagnosis present

## 2018-09-13 DIAGNOSIS — R64 Cachexia: Secondary | ICD-10-CM | POA: Diagnosis present

## 2018-09-13 LAB — COMPREHENSIVE METABOLIC PANEL
ALT: 7 U/L (ref 0–44)
AST: 50 U/L — ABNORMAL HIGH (ref 15–41)
Albumin: 2.8 g/dL — ABNORMAL LOW (ref 3.5–5.0)
Alkaline Phosphatase: 36 U/L — ABNORMAL LOW (ref 38–126)
Anion gap: 6 (ref 5–15)
BUN: 24 mg/dL — ABNORMAL HIGH (ref 8–23)
CO2: 22 mmol/L (ref 22–32)
Calcium: 8.4 mg/dL — ABNORMAL LOW (ref 8.9–10.3)
Chloride: 112 mmol/L — ABNORMAL HIGH (ref 98–111)
Creatinine, Ser: 0.75 mg/dL (ref 0.44–1.00)
GFR calc Af Amer: >60 mL/min (ref >60)
GFR calc non Af Amer: >60 mL/min (ref >60)
Glucose, Bld: 95 mg/dL (ref 70–99)
Potassium: 3.8 mmol/L (ref 3.5–5.1)
Sodium: 140 mmol/L (ref 135–145)
Total Bilirubin: 0.6 mg/dL (ref 0.3–1.2)
Total Protein: 4.9 g/dL — ABNORMAL LOW (ref 6.5–8.1)

## 2018-09-13 LAB — CBC WITH DIFFERENTIAL/PLATELET
Abs Immature Granulocytes: 0.04 10*3/uL (ref 0.00–0.07)
Basophils Absolute: 0 10*3/uL (ref 0.0–0.1)
Basophils Relative: 1 %
Eosinophils Absolute: 0.1 10*3/uL (ref 0.0–0.5)
Eosinophils Relative: 1 %
HCT: 32.4 % — ABNORMAL LOW (ref 36.0–46.0)
Hemoglobin: 10.5 g/dL — ABNORMAL LOW (ref 12.0–15.0)
Immature Granulocytes: 1 %
Lymphocytes Relative: 11 %
Lymphs Abs: 1 10*3/uL (ref 0.7–4.0)
MCH: 30.4 pg (ref 26.0–34.0)
MCHC: 32.4 g/dL (ref 30.0–36.0)
MCV: 93.9 fL (ref 80.0–100.0)
Monocytes Absolute: 0.6 10*3/uL (ref 0.1–1.0)
Monocytes Relative: 7 %
Neutro Abs: 7.1 10*3/uL (ref 1.7–7.7)
Neutrophils Relative %: 79 %
Platelets: 125 10*3/uL — ABNORMAL LOW (ref 150–400)
RBC: 3.45 MIL/uL — ABNORMAL LOW (ref 3.87–5.11)
RDW: 13.2 % (ref 11.5–15.5)
WBC: 8.9 10*3/uL (ref 4.0–10.5)
nRBC: 0 % (ref 0.0–0.2)

## 2018-09-13 LAB — D-DIMER, QUANTITATIVE: D-Dimer, Quant: 1.11 ug/mL-FEU — ABNORMAL HIGH (ref 0.00–0.50)

## 2018-09-13 LAB — MAGNESIUM: Magnesium: 2 mg/dL (ref 1.7–2.4)

## 2018-09-13 LAB — NOVEL CORONAVIRUS, NAA (HOSP ORDER, SEND-OUT TO REF LAB; TAT 18-24 HRS): SARS-CoV-2, NAA: NOT DETECTED

## 2018-09-13 LAB — CK: Total CK: 1099 U/L — ABNORMAL HIGH (ref 38–234)

## 2018-09-13 MED ORDER — SODIUM CHLORIDE 0.9 % IV SOLN
INTRAVENOUS | Status: AC
  Administered 2018-09-14: 01:00:00 via INTRAVENOUS

## 2018-09-13 MED ORDER — ENSURE ENLIVE PO LIQD: 237.0000 mL | Freq: Two times a day (BID) | ORAL | Status: DC

## 2018-09-13 MED ORDER — IOHEXOL 300 MG/ML  SOLN
75.0000 mL | Freq: Once | INTRAMUSCULAR | Status: AC | PRN
  Administered 2018-09-13: 75 mL via INTRAVENOUS

## 2018-09-13 NOTE — Telephone Encounter (Signed)
Called spoke with Baldo Ash  is at [facility] admission on Saturday due to falls. Pt will go to Rehab after discharge.

## 2018-09-13 NOTE — TOC Initial Note (Addendum)
Transition of Care [facility]) - Initial/Assessment Note    Patient Details  Name: Stephanie Shaw MRN: [id number] Date of Birth: Apr 27, 1940  Transition of Care [facility]) CM/SW Contact:    Gelene Mink, [place] Phone Number: 09/13/2018, 12:43 PM  Clinical Narrative:                  CSW met with the patient at bedside. The patient is agreeable to returning to [place] acuity unit for rehab. The patient gave the CSW permission to contact her daughter Baldo Ash. The patient did not have any other questions or concerns.   CSW attempted to contact the patient's daughter. CSW was not able to leave a message due to her voicemail box being full.   CSW called and spoke with Raquel Sarna [phone]: [phone]), social worker at [facility]. She stated that they have started insurance authorization. CSW informed Raquel Sarna that the patient would be ready for discharge tomorrow and that they are still awaiting her COVID screen. Raquel Sarna thanked the CSW and told her that she would be in touch on Tuesday.   The patient will DC to room 923 and number for report is [phone]: [phone] or [phone] and ask for nurse. PTAR will need to bring the patient to [facility] building, entrance E and take the patient to the second floor. [facility] will need discharge summary before the patient can arrive.   CSW will continue to follow.   Expected Discharge Plan: Skilled Nursing Facility Barriers to Discharge: Insurance Authorization, Continued Medical Work up, Other (comment)(Awaiting COVID test)   Patient Goals and CMS Choice Patient states their goals for this hospitalization and ongoing recovery are:: Pt is agreeable to return back to [facility] and will be going to the acuity unit CMS Medicare.gov Compare Post Acute Care list provided to:: Patient Choice offered to / list presented to : Patient  Expected Discharge Plan and Services Expected Discharge Plan: [place] In-house Referral: Clinical Social Work Discharge Planning Services: NA Post Acute Care Choice: [place] Living arrangements for the past 2 months: [place]                 DME Arranged: N/A DME Agency: NA       HH Arranged: NA [place] Agency: NA        Prior Living Arrangements/Services Living arrangements for the past 2 months: [place] Lives with:: Self Patient language and need for interpreter reviewed:: No Do you feel safe going back to the place where you live?: Yes      Need for Family Participation in Patient Care: Yes (Comment) Care giver support system in place?: Yes (comment)   Criminal Activity/Legal Involvement Pertinent to Current Situation/Hospitalization: No - Comment as needed  Activities of Daily Living      Permission Sought/Granted Permission sought to share information with : Case Manager Permission granted to share information with : Yes, Verbal Permission Granted  Share Information with NAME: Baldo Ash  Permission granted to share info w AGENCY: [place] granted to share info w Relationship: Daughter     Emotional Assessment Appearance:: Appears stated age Attitude/Demeanor/Rapport: Engaged Affect (typically observed): Calm Orientation: : Oriented to Self, Oriented to Place, Oriented to  Time, Oriented to Situation Alcohol / Substance Use: Not Applicable Psych Involvement: No (comment)  Admission diagnosis:  Parkinson disease ([place]) [G20] Weakness [R53.1] Generalized weakness [R53.1] Patient Active Problem List   Diagnosis Date  Noted  . Weakness 09/12/2018  . Dehydration 09/12/2018  . Constipation 07/19/2018  . Bilateral lower extremity edema 07/19/2018  . 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 Unstable gait 02/16/2017  . Parkinson's disease ([place]) 11/26/2016  . Parkinsonism ([place]) 10/17/2016  . Fatigue 10/17/2016  . Long term (current)  use of anticoagulants 05/29/2016  . Left wrist pain 01/03/2016  . Left hand pain 01/03/2016  . Vaginal atrophy 10/19/2015  . Tremor, unspecified 10/19/2015  . Osteopenia 10/19/2015  . 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 Closed fracture of part of upper end of humerus 05/01/2015  . Hematuria 02/04/2014  . 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 Cervical pain (neck) 06/20/2010  . Atrial fibrillation ([place]) 05/21/2010  . Menopausal and postmenopausal disorder 02/16/2007  . DEGENERATIVE JOINT DISEASE, RIGHT HIP 02/16/2007   PCP:  [name], [name], MD Pharmacy:   [id number]: [phone]: [phone]    Social Determinants of Health (SDOH) Interventions    Readmission Risk Interventions No flowsheet data found.

## 2018-09-13 NOTE — Progress Notes (Signed)
Called and s/w Baldo Ash (daughter) at [phone] and updated her on pt progress and plan of care.  Dr. Doristine Bosworth and I have spoken this am and he is planning on contacting the daughter before noon once he has rounded on the pt.  The daughter understands this and was appreciative of the call for the update on her mom's status.

## 2018-09-13 NOTE — Progress Notes (Signed)
Occupational Therapy Evaluation Patient Details Name: Stephanie Shaw MRN: [id number] DOB: 1940/01/28 Today's Date: 09/13/2018    History of Present Illness Patient is a 78 y/o female who presents with weakness and loss of appetite. PMH includes PD, paroxsymal A-fib.   Clinical Impression   PTA, pt was living at [place] alone, and reports she was independent with ADL/IADL and functional mobility. Pt currently requires modA+2 progressing to modA+1 for stand pivot transfer. Pt requires maxA for LB ADL and minguard-minA for UB ADL. Due to decline in current level of function, pt would benefit from acute OT to address established goals to facilitate safe D/C to venue listed below. At this time, recommend SNF follow-up. Will continue to follow acutely.     Follow Up Recommendations  SNF;Supervision/Assistance - 24 hour    Equipment Recommendations  3 in 1 bedside commode    Recommendations for Other Services       Precautions / Restrictions Precautions Precautions: Fall Precaution Comments: multiple falls at home; PD Restrictions Weight Bearing Restrictions: No      Mobility Bed Mobility Overal bed mobility: Needs Assistance Bed Mobility: Rolling;Sidelying to Sit Rolling: Min assist Sidelying to sit: Mod assist;HOB elevated       General bed mobility comments: Step by step cues; assist with BLEs and to reach for rail, assist with trunk. Very slow, impaired initation.  Transfers Overall transfer level: Needs assistance Equipment used: Rolling [place] (2 wheeled) Transfers: Sit to/from [facility] Sit to Stand: Mod assist Stand pivot transfers: Mod assist       General transfer comment: assist to powerup to standing;initially modA+2 for pivot from EOB to BSC, progressing to modA+1 from [facility] to recliner;max vc for sequencing movement and initiate BLE    Balance Overall balance assessment: History of Falls;Needs assistance Sitting-balance support:  Feet supported;No upper extremity supported Sitting balance-Leahy Scale: Fair     Standing balance support: During functional activity Standing balance-Leahy Scale: Poor Standing balance comment: Reliant on BUEs for support in standing and external support.                           ADL either performed or assessed with clinical judgement   ADL Overall ADL's : Needs assistance/impaired Eating/Feeding: Set up;Sitting   Grooming: Min guard;Sitting   Upper Body Bathing: Min guard;Sitting   Lower Body Bathing: Maximal assistance;Moderate assistance;Sit to/from stand   Upper Body Dressing : Minimal assistance;Sitting   Lower Body Dressing: Maximal assistance;Sit to/from stand   Toilet Transfer: Moderate assistance;+2 for physical assistance;Stand-pivot;RW;BSC Toilet Transfer Details (indicate cue type and reason): transferred from EOB to [place] and Hygiene: Total assistance Toileting - Clothing Manipulation Details (indicate cue type and reason): totalA for pericare;modA to maintain stability while standing     Functional mobility during ADLs: Moderate assistance;Rolling [place] General ADL Comments: heavy modA for transfers;initially modA+2 for stand-pivot from EOB to [facility] progressing to heavy modA+1 for transfer from [facility] to recliner     Vision         Perception     Praxis      Pertinent Vitals/Pain Pain Assessment: Faces Faces Pain Scale: Hurts little more Pain Location: BLEs Pain Descriptors / Indicators: Grimacing Pain Intervention(s): Limited activity within patient's tolerance;Monitored during session     Hand Dominance Right   Extremity/Trunk Assessment Upper Extremity Assessment Upper Extremity Assessment: Generalized weakness   Lower Extremity Assessment Lower Extremity Assessment: Generalized weakness  Cervical / Trunk Assessment Cervical / Trunk Assessment: Kyphotic   Communication  Communication Communication: No difficulties(slowed speech at times)   Cognition Arousal/Alertness: Awake/alert Behavior During Therapy: WFL for tasks assessed/performed Overall Cognitive Status: Within Functional Limits for tasks assessed                                     General Comments  irritation noted on pt's back, RN aware;educated pt on importance of mobility throughout the day;educated pt on use of IS and to do 10x/day;RN present to assist with transfers    Exercises     Shoulder Instructions      Home Living Family/patient expects to be discharged to:: Other (Comment) Living Arrangements: Alone Available Help at Discharge: Available PRN/intermittently Type of Home: Independent living facility Home Access: Level entry     Home Layout: One level     Bathroom Shower/Tub: Occupational psychologist: Handicapped height     Home Equipment: Environmental consultant - 2 wheels;Shower seat          Prior Functioning/Environment Level of Independence: Needs assistance  Gait / Transfers Assistance Needed: Started using a RW ~1 month ago. Recent falls. Goes to therapy 3 times/week ADL's / Homemaking Assistance Needed: Does own ADLs. Makes breakfast; has meals delivered for lunch/dinner.            OT Problem List: Decreased strength;Decreased range of motion;Decreased activity tolerance;Impaired balance (sitting and/or standing);Decreased safety awareness;Decreased knowledge of use of DME or AE;Decreased knowledge of precautions;Pain      OT Treatment/Interventions: Self-care/ADL training;Therapeutic exercise;Energy conservation;DME and/or AE instruction;Therapeutic activities;Patient/family education;Balance training    OT Goals(Current goals can be found in the care plan section) Acute Rehab OT Goals Patient Stated Goal: go to SNF and get stronger OT Goal Formulation: With patient Time For Goal Achievement: 09/27/18 Potential to Achieve Goals: Good ADL  Goals Pt Will Perform Grooming: with modified independence Pt Will Perform Upper Body Dressing: with modified independence Pt Will Perform Lower Body Dressing: with modified independence;with adaptive equipment Pt Will Transfer to Toilet: with modified independence;ambulating Pt Will Perform Toileting - Clothing Manipulation and hygiene: with modified independence;sit to/from stand  OT Frequency: Min 2X/week   Barriers to D/C: Decreased caregiver support  pt lives in ILF       Co-evaluation              AM-PAC OT "6 Clicks" Daily Activity     Outcome Measure Help from another person eating meals?: None Help from another person taking care of personal grooming?: A Little Help from another person toileting, which includes using toliet, bedpan, or urinal?: A Lot Help from another person bathing (including washing, rinsing, drying)?: A Lot Help from another person to put on and taking off regular upper body clothing?: A Little Help from another person to put on and taking off regular lower body clothing?: A Lot 6 Click Score: 16   End of Session Equipment Utilized During Treatment: Gait belt;Rolling [place] Nurse Communication: Mobility status(RN present to assist with transfers)  Activity Tolerance: Patient tolerated treatment well Patient left: in chair;with call bell/phone within reach;with chair alarm set  OT Visit Diagnosis: Unsteadiness on feet (R26.81);Other abnormalities of gait and mobility (R26.89);Muscle weakness (generalized) (M62.81);History of falling (Z91.81);Pain Pain - Right/Left: (bilat) Pain - part of body: Leg;Ankle and joints of foot  Time: [id number] OT Time Calculation (min): 46 min Charges:  OT General Charges $OT Visit: 1 Visit OT Evaluation $OT Eval Moderate Complexity: 1 Mod OT Treatments $Self Care/Home Management : 23-37 mins  Dorinda Hill OTR/L Acute Rehabilitation Services Office: [place] 09/13/2018, 2:47 PM

## 2018-09-13 NOTE — NC FL2 (Signed)
[place] MEDICAID FL2 LEVEL OF CARE SCREENING TOOL     IDENTIFICATION  Patient Name: Stephanie Shaw Birthdate: 26-Oct-1940 Sex: female Admission Date (Current Location): 09/11/2018  [facility] and [place] Number:  Herbalist and Address:  [facility] [place] [address] [place], [place] [address]      Provider Number: [id number]  Attending Physician Name and Address:  Darliss Cheney, MD  Relative Name and Phone Number:  [name]    Current Level of Care: Hospital Recommended Level of Care: [place] Prior Approval Number:    Date Approved/Denied: 09/12/18 PASRR Number: [id number]  Discharge Plan: SNF    Current Diagnoses: Patient Active Problem List   Diagnosis Date Noted  . Weakness 09/12/2018  . Dehydration 09/12/2018  . Constipation 07/19/2018  . Bilateral lower extremity edema 07/19/2018  . 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 Unstable gait 02/16/2017  . Parkinson's disease ([place]) 11/26/2016  . Parkinsonism ([place]) 10/17/2016  . Fatigue 10/17/2016  . Long term (current) use of anticoagulants 05/29/2016  . Left wrist pain 01/03/2016  . Left hand pain 01/03/2016  . Vaginal atrophy 10/19/2015  . Tremor, unspecified 10/19/2015  . Osteopenia 10/19/2015  . 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 Closed fracture of part of upper end of humerus 05/01/2015  . Hematuria 02/04/2014  . 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 Cervical pain (neck) 06/20/2010  . Atrial fibrillation ([place]) 05/21/2010  . Menopausal and postmenopausal disorder 02/16/2007  . DEGENERATIVE JOINT DISEASE, RIGHT HIP 02/16/2007    Orientation RESPIRATION BLADDER Height & Weight     Self, Time, Situation, Place  Normal Continent, External catheter Weight: 60.8 kg Height:  5\' 8"  (172.7 cm)  BEHAVIORAL SYMPTOMS/MOOD NEUROLOGICAL BOWEL NUTRITION STATUS      Continent Diet(Regular diet, thin liquids)  AMBULATORY  STATUS COMMUNICATION OF NEEDS Skin   Limited Assist   Bruising(bruising on back, buttocks, thigh; has dry skin as well)                       Personal Care Assistance Level of Assistance  Bathing, Feeding, Dressing, Total care Bathing Assistance: Limited assistance Feeding assistance: Independent Dressing Assistance: Limited assistance Total Care Assistance: Limited assistance   Functional Limitations Info  Sight, Hearing, Speech Sight Info: Adequate Hearing Info: Adequate Speech Info: Adequate    SPECIAL CARE FACTORS FREQUENCY  PT (By licensed PT), OT (By licensed OT)     PT Frequency: 5 times a week OT Frequency: 5 times a week            Contractures Contractures Info: Not present    Additional Factors Info  Code Status, Allergies Code Status Info: Full Code Allergies Info: Doxycycline, Sulfonamide Derivatives           Current Medications (09/13/2018):  This is the current hospital active medication list Current Facility-Administered Medications  Medication Dose Route Frequency Provider Last Rate Last Dose  . 0.9 %  sodium chloride infusion   Intravenous Continuous Black, Lezlie Octave, NP      . acetaminophen (TYLENOL) tablet 650 mg  650 mg Oral Q6H PRN Jani Gravel, MD   650 mg at 09/12/18 2058   Or  . acetaminophen (TYLENOL) suppository 650 mg  650 mg Rectal Q6H PRN Jani Gravel, MD      . carbidopa-levodopa (SINEMET IR) 25-100 MG per tablet immediate release 1.5 tablet  1.5 tablet Oral QID Jani Gravel, MD   1.5  tablet at 09/13/18 1103  . cefTRIAXone (ROCEPHIN) 1 g in sodium chloride 0.9 % 100 mL IVPB  1 g Intravenous Q24H Pahwani, Einar Grad, MD 200 mL/hr at 09/12/18 1732 1 g at 09/12/18 1732  . diltiazem (CARDIZEM CD) 24 hr capsule 180 mg  180 mg Oral Daily Jani Gravel, MD   180 mg at 09/13/18 1102  . feeding supplement (ENSURE ENLIVE) (ENSURE ENLIVE) liquid 237 mL  237 mL Oral BID BM Pahwani, Ravi, MD      . rivaroxaban (XARELTO) tablet 20 mg  20 mg Oral Q supper Laren Everts, RPH   20 mg at 09/12/18 1743     Discharge Medications: Please see discharge summary for a list of discharge medications.  Relevant Imaging Results:  Relevant Lab Results:   Additional Information SSN: [id number]  Marilu Favre, RN

## 2018-09-13 NOTE — Progress Notes (Addendum)
PROGRESS NOTE    Stephanie PONTHIEUX  [id number] DOB: 17-Jul-1940 DOA: 09/11/2018 PCP: [name], [name], MD   Brief Narrative:  Stephanie Shaw  is a 78 y.o. female, w Parkinsons disease, Osteoarthritis, Paroxysmal Afib, presented with decrease in oral intake and generalized weakness. Pt is typically able to walk using [place], but is no longer able to walk. Pt is presently in independent living at [facility] and requires FL2 to get into higher level or care.  No fever, chills, cough, cp, palp, sob, n/v, abd pain, diarrhea, brbpr, black stool, dysuria, hematuria.    She was hemodynamically stable in the emergency department.  CT head unremarkable.  Patient was admitted under hospitalist service for generalized weakness.  She was then found to have elevated CK level making the diagnosis of possible rhabdomyolysis.  She tells me that she also fell on Friday and was on the floor for approximately 30 minutes.  She has chronic hip and low back pain but nothing acute since that fall.  Despite of falling, she went to have her physical therapy that day which she gets typically 3 times a week.  Patient then started having some chest pain with deep inspiration.  D-dimer was elevated.  CT angiogram of the chest to rule out PE.  She was evaluated by PT OT and they recommended discharging to SNF.  Assessment & Plan:   Principal Problem:   Weakness Active Problems:   Atrial fibrillation (HCC)   Parkinson's disease (HCC)   Dehydration   Fall/generalized weakness: Evaluated by PT OT.  Recommendation for discharging to SNF.  Insurance authorization pending..  Mild rhabdomyolysis/nontraumatic: CK elevated up to 2000 at the time of admission.  Minimal trauma on Friday.  No bruise on the body exam.  No pain that is acute.  Improved 50% today which is what the goal is.  Continue gentle hydration and repeat CK tomorrow.  Parkinson's disease: Continue home medications.  Neck pain: Did not complain of neck pain to me  or any right arm weakness.  Cervical spine MRI negative for any acute pathology.  Paroxysmal atrial fibrillation: Can he needs to be in sinus rhythm.  Continue home dose of Cardizem along with Xarelto.  Hyperlipidemia: We will hold Crestor for some time due to rhabdomyolysis.  UTI: Continue Rocephin.  Chest pain: Elevated d-dimer.  PE ruled out.  Likely musculoskeletal.  DVT prophylaxis: Xarelto Code Status: Full code Family Communication: Called and spoke to patient's daughter Baldo Ash   And updated her.  Answered several questions  She was grateful. .Disposition Plan: Discharge to SNF, insurance authorization pending.  Likely discharge tomorrow.  Consultants:   None  Procedures:   None  Antimicrobials:   IV Rocephin started on 09/12/2018   Subjective: Patient seen and examined.  Overall feels better.  Complains of some chest pain with deep inspiration.  No shortness of breath.  Some low back pain.  No other complaint.  Objective: Vitals:   09/12/18 2026 09/12/18 2257 09/13/18 0300 09/13/18 0639  BP: 92/70 95/67 106/63 118/78  Pulse: 65 89 65 71  Resp:  17 16 15   Temp: 98 F (36.7 C) 98.8 F (37.1 C) 98.5 F (36.9 C) (!) 97.5 F (36.4 C)  TempSrc: Oral Oral Oral Oral  SpO2: 97% 95% 95% 95%  Weight:      Height:        Intake/Output Summary (Last 24 hours) at 09/13/2018 1307 Last data filed at 09/13/2018 0930 Gross per 24 hour  Intake 1522.99 ml  Output 1550 ml  Net -27.01 ml   Filed Weights   09/12/18 0100  Weight: 60.8 kg    Examination: General exam: Appears calm and comfortable  Respiratory system: Clear to auscultation. Respiratory effort normal. Cardiovascular system: S1 & S2 heard, RRR. No JVD, murmurs, rubs, gallops or clicks. No pedal edema. Gastrointestinal system: Abdomen is nondistended, soft and nontender. No organomegaly or masses felt. Normal bowel sounds heard. Central nervous system: Alert and oriented. No focal neurological  deficits. Extremities: Symmetric 5 x 5 power. Skin: No rashes, lesions or ulcers Psychiatry: Judgement and insight appear normal. Mood & affect appropriate.     Data Reviewed: I have personally reviewed following labs and imaging studies  CBC: Recent Labs  Lab 09/11/18 2105 09/12/18 0304 09/13/18 0201  WBC 10.6* 8.0 8.9  NEUTROABS 9.4*  --  7.1  HGB 12.6 11.5* 10.5*  HCT 40.1 35.2* 32.4*  MCV 96.4 94.4 93.9  PLT 170 150 0000000*   Basic Metabolic Panel: Recent Labs  Lab 09/11/18 2105 09/12/18 0304 09/13/18 0201  NA 139 140 140  K 3.7 3.6 3.8  CL 106 108 112*  CO2 22 21* 22  GLUCOSE 99 89 95  BUN 29* 27* 24*  CREATININE 0.98 0.88 0.75  CALCIUM 9.9 9.2 8.4*  MG  --   --  2.0   GFR: Estimated Creatinine Clearance: 55.6 mL/min (by C-G formula based on SCr of 0.75 mg/dL). Liver Function Tests: Recent Labs  Lab 09/12/18 0304 09/13/18 0201  AST 60* 50*  ALT 7 7  ALKPHOS 45 36*  BILITOT 1.1 0.6  PROT 6.2* 4.9*  ALBUMIN 3.7 2.8*   No results for input(s): LIPASE, AMYLASE in the last 168 hours. No results for input(s): AMMONIA in the last 168 hours. Coagulation Profile: No results for input(s): INR, PROTIME in the last 168 hours. Cardiac Enzymes: Recent Labs  Lab 09/12/18 0304 09/13/18 0201  CKTOTAL 1,900* 1,099*   BNP (last 3 results) No results for input(s): PROBNP in the last 8760 hours. HbA1C: No results for input(s): HGBA1C in the last 72 hours. CBG: No results for input(s): GLUCAP in the last 168 hours. Lipid Profile: No results for input(s): CHOL, HDL, LDLCALC, TRIG, CHOLHDL, LDLDIRECT in the last 72 hours. Thyroid Function Tests: Recent Labs    09/11/18 2105  TSH 0.408   Anemia Panel: Recent Labs    09/12/18 0304  VITAMINB12 1,217*   Sepsis Labs: No results for input(s): PROCALCITON, LATICACIDVEN in the last 168 hours.  No results found for this or any previous visit (from the past 240 hour(s)).    Radiology Studies: Ct Head Wo  Contrast  Result Date: 09/11/2018 CLINICAL DATA:  Head trauma, on anticoagulation EXAM: CT HEAD WITHOUT CONTRAST TECHNIQUE: Contiguous axial images were obtained from the base of the skull through the vertex without intravenous contrast. COMPARISON:  04/29/2015 FINDINGS: Brain: No acute intracranial abnormality. Specifically, no hemorrhage, hydrocephalus, mass lesion, acute infarction, or significant intracranial injury. Vascular: No hyperdense vessel or unexpected calcification. Skull: No acute calvarial abnormality. Sinuses/Orbits: Visualized paranasal sinuses and mastoids clear. Orbital soft tissues unremarkable. Other: None IMPRESSION: No acute intracranial abnormality. Electronically Signed   By: Rolm Baptise M.D.   On: 09/11/2018 23:53   Ct Angio Chest Pe W Or Wo Contrast  Result Date: 09/13/2018 CLINICAL DATA:  PE suspected EXAM: CT ANGIOGRAPHY CHEST WITH CONTRAST TECHNIQUE: Multidetector CT imaging of the chest was performed using the standard protocol during bolus administration of intravenous contrast. Multiplanar CT image reconstructions and  MIPs were obtained to evaluate the vascular anatomy. CONTRAST:  69mL OMNIPAQUE IOHEXOL 300 MG/ML  SOLN COMPARISON:  Chest radiograph, 09/12/2018, CT chest, 09/24/2016 FINDINGS: Cardiovascular: Satisfactory opacification of the pulmonary arteries to the segmental level. No evidence of pulmonary embolism. Cardiomegaly. Scattered left coronary artery calcifications. No pericardial effusion. Aortic atherosclerosis. Mediastinum/Nodes: No enlarged mediastinal, hilar, or axillary lymph nodes. There is an exophytic 3.1 cm nodule posteriorly from the right lobe of the thyroid, unchanged from prior examination. Trachea, and esophagus demonstrate no significant findings. Lungs/Pleura: Small bilateral pleural effusions and associated atelectasis or consolidation Upper Abdomen: No acute abnormality. Musculoskeletal: No chest wall abnormality. No acute or significant osseous  findings. Review of the MIP images confirms the above findings. IMPRESSION: 1.  Negative examination for pulmonary embolism. 2. Small bilateral pleural effusions and associated atelectasis or consolidation. 3. Cardiomegaly, coronary artery disease and aortic atherosclerosis. 4. Unchanged 3.1 cm exophytic nodule of the right lobe of the thyroid. Electronically Signed   By: Eddie Candle M.D.   On: 09/13/2018 12:00   Mr Cervical Spine Wo Contrast  Result Date: 09/12/2018 CLINICAL DATA:  Neck pain, initial exam Radiculopathy neck pain, weakness in legs, inability to walk. Intermittent neck pain. Personal history of Parkinson's. EXAM: MRI CERVICAL SPINE WITHOUT CONTRAST TECHNIQUE: Multiplanar, multisequence MR imaging of the cervical spine was performed. No intravenous contrast was administered. COMPARISON:  CT of the cervical spine 06/17/2010 FINDINGS: Alignment: Exaggerated cervical lordosis is present. There is slight anterolisthesis at C4-5 and C7-T1. No other significant listhesis is present in the cervical spine. Vertebrae: Mild endplate marrow changes are present at C5-6. Marrow signal and vertebral body heights are otherwise normal. Skull base is normal. Cord: Normal signal is present in the cervical and upper thoracic spinal cord to the lowest imaged level, T3-4. Posterior Fossa, vertebral arteries, paraspinal tissues: Craniocervical junction is normal. Flow is present in the vertebral arteries bilaterally. Visualized intracranial contents are normal. Disc levels: C2-3: Negative. C3-4: Asymmetric right-sided facet hypertrophy is present. There is no significant stenosis. C4-5: There is mild facet hypertrophy bilaterally without significant stenosis. C5-6: A broad-based disc osteophyte complex is present. Bilateral facet hypertrophy is noted. Central canal is patent. Moderate foraminal narrowing is worse right than left due to uncovertebral disease. C6-7: Minimal left-sided uncovertebral spurring is present  without significant stenosis. C7-T1: Negative. IMPRESSION: 1. No acute or focal lesion to explain inability to walk weakness in legs. 2. Chronic loss of disc height and endplate degenerative change at C5-6 with moderate foraminal narrowing, right greater than left, secondary to uncovertebral and facet disease. 3. No significant central canal stenosis. 4. No other significant foraminal narrowing in the cervical spine. Electronically Signed   By: San Morelle M.D.   On: 09/12/2018 07:32   Mr Lumbar Spine Wo Contrast  Result Date: 09/12/2018 CLINICAL DATA:  Back pain, > 6wks conservative tx, persistent sx back pain, weakness of the legs, inability to walk. EXAM: MRI LUMBAR SPINE WITHOUT CONTRAST TECHNIQUE: Multiplanar, multisequence MR imaging of the lumbar spine was performed. No intravenous contrast was administered. COMPARISON:  None. FINDINGS: Segmentation: 5 non rib-bearing lumbar type vertebral bodies are present. The lowest fully formed vertebral body is L5. Alignment: Normal lumbar lordosis is present. There is no significant listhesis or scoliosis. Vertebrae:  Marrow signal and vertebral body heights are normal. Conus medullaris and cauda equina: Conus extends to the T12 level. Conus and cauda equina appear normal. Paraspinal and other soft tissues: A 9 mm simple cyst is present in the medial aspect  of the right kidney. A 3.5 cm right adnexal cyst is present. The urinary bladder is mildly distended. Disc levels: T12-L1: Negative. L1-2: Negative. L2-3: Mild facet hypertrophy is present bilaterally. There is some disc bulging. No significant stenosis is present. L3-4: Moderate facet hypertrophy is present. There is mild disc bulging without significant stenosis. L4-5: Mild facet hypertrophy is present bilaterally. There is no focal disc protrusion or stenosis. L5-S1: Mild facet hypertrophy is present bilaterally. There is no focal disc protrusion or stenosis. IMPRESSION: 1. Mild moderate facet  hypertrophy in the lumbar spine without significant disc disease or focal stenosis to explain the patient's inability to walk or lower extremity weakness. 2. 3.5 cm right adnexal cyst. This lesion. Benign. Recommend follow-up ultrasound at 6-12 months for further evaluation. This recommendation follows ACR consensus guidelines: White Paper of the ACR Incidental Findings Committee II on Adnexal Findings. J Am Coll Radiol [phone]. Electronically Signed   By: San Morelle M.D.   On: 09/12/2018 08:00   Dg Chest Port 1 View  Result Date: 09/12/2018 CLINICAL DATA:  Weakness. EXAM: PORTABLE CHEST 1 VIEW COMPARISON:  09/17/2016 FINDINGS: Stable cardiomegaly. Unchanged mediastinal contours with aortic atherosclerosis. Biapical pleuroparenchymal scarring. No acute airspace disease, pulmonary edema, pleural fluid or pneumothorax. EKG lead overlies the chest. No acute osseous abnormalities are seen. IMPRESSION: Stable cardiomegaly without acute abnormality. Aortic Atherosclerosis (ICD10-I70.0). Electronically Signed   By: Keith Rake M.D.   On: 09/12/2018 03:24    Scheduled Meds:  carbidopa-levodopa  1.5 tablet Oral QID   diltiazem  180 mg Oral Daily   feeding supplement (ENSURE ENLIVE)  237 mL Oral BID BM   rivaroxaban  20 mg Oral Q supper   Continuous Infusions:  sodium chloride     cefTRIAXone (ROCEPHIN)  IV 1 g (09/12/18 1732)     LOS: 0 days   Time spent: 31 minutes   Darliss Cheney, MD [facility] Pager [phone]  If 7PM-7AM, please contact night-coverage [email or web address] Password [email or web address] 09/13/2018, 1:07 PM

## 2018-09-13 NOTE — Telephone Encounter (Signed)
noted 

## 2018-09-13 NOTE — Care Management Obs Status (Signed)
[facility] NOTIFICATION   Patient Details  Name: JOLLENE RAWL MRN: [id number] Date of Birth: [birth year]   Medicare Observation Status Notification Given:  Yes    Marilu Favre, RN 09/13/2018, 12:04 PM

## 2018-09-13 NOTE — Plan of Care (Signed)
  Problem: Health Behavior/Discharge Planning: Goal: Ability to manage health-related needs will improve Outcome: Progressing   

## 2018-09-13 NOTE — Progress Notes (Signed)
Paged Dr. Doristine Bosworth this am asking him to call patient's daughter/

## 2018-09-13 NOTE — Progress Notes (Signed)
Added ensure as pt states her appetite at friends home had been decreased recently and she had not been eating well.  Assisted pt to order her breakfast and called in.

## 2018-09-13 NOTE — Telephone Encounter (Signed)
Patient left msg with after hours about mother having PD. She has fallen x3 in the last 48 hours (without known injuries) and her RX's have gotten messed up. Nurse at independent living has suggested she get help with RX and fluids. She has declined in health during pandemic and she is basically frozen. Cant stand, walk or take meds. She may have missed doses of sinamet. No food and only 200cc of fluid. BP 108/80 HR 120

## 2018-09-14 ENCOUNTER — Encounter (HOSPITAL_COMMUNITY): Payer: Self-pay | Admitting: General Practice

## 2018-09-14 ENCOUNTER — Inpatient Hospital Stay (HOSPITAL_COMMUNITY): Payer: Medicare Other

## 2018-09-14 DIAGNOSIS — N390 Urinary tract infection, site not specified: Secondary | ICD-10-CM

## 2018-09-14 DIAGNOSIS — I82409 Acute embolism and thrombosis of unspecified deep veins of unspecified lower extremity: Secondary | ICD-10-CM

## 2018-09-14 HISTORY — DX: Urinary tract infection, site not specified: N39.0

## 2018-09-14 LAB — BASIC METABOLIC PANEL
Anion gap: 6 (ref 5–15)
BUN: 15 mg/dL (ref 8–23)
CO2: 23 mmol/L (ref 22–32)
Calcium: 8.5 mg/dL — ABNORMAL LOW (ref 8.9–10.3)
Chloride: 111 mmol/L (ref 98–111)
Creatinine, Ser: 0.68 mg/dL (ref 0.44–1.00)
GFR calc Af Amer: >60 mL/min (ref >60)
GFR calc non Af Amer: >60 mL/min (ref >60)
Glucose, Bld: 109 mg/dL — ABNORMAL HIGH (ref 70–99)
Potassium: 3.9 mmol/L (ref 3.5–5.1)
Sodium: 140 mmol/L (ref 135–145)

## 2018-09-14 LAB — CK: Total CK: 599 U/L — ABNORMAL HIGH (ref 38–234)

## 2018-09-14 MED ORDER — CEPHALEXIN 250 MG PO CAPS: 250.0000 mg | ORAL_CAPSULE | Freq: Three times a day (TID) | ORAL | 0 refills | Status: AC

## 2018-09-14 NOTE — Discharge Summary (Signed)
Physician Discharge Summary  VAIDA BURRUSS [id number] DOB: 12-17-1940 DOA: 09/11/2018  PCP: [name], [name], MD  Admit date: 09/11/2018 Discharge date: 09/14/2018  Admitted From: Home Independent Living at [facility] Disposition:  [facility]/SNF  Recommendations for Outpatient Follow-up:  1. Follow up with PCP in 1-2 weeks 2. Please obtain BMP/CBC in one week 3. Please follow up on the following pending results:  Home Health: None Equipment/Devices: None  Discharge Condition: Stable CODE STATUS: Full code Diet recommendation: Cardiac  Subjective: Seen and examined.  Continues to complain of pain in the back and in the legs.  No other complaint.  Brief/Interim Summary: minday Stephanie Shaw y.o.female,w Parkinsons disease, Osteoarthritis, Paroxysmal Afib, presented with decrease in oral intake and generalized weakness. Pt is typically able to walk using [place], but is no longer able to walk. Pt is presently in independent living at [facility] and requires FL2 to get into higher level or care.  No fever, chills, cough, cp, palp, sob, n/v, abd pain, diarrhea, brbpr, black stool, dysuria, hematuria.   She was hemodynamically stable in the emergency department.  CT head unremarkable.  Patient was admitted under hospitalist service for generalized weakness.  She was then found to have elevated CK level making the diagnosis of possible rhabdomyolysis.  She tells me that she also fell on Friday and was on the floor for approximately 30 minutes.  She has chronic hip and low back pain but nothing acute since that fall.  Despite of falling, she went to have her physical therapy that day which she gets typically 3 times a week.  Patient then started having some chest pain with deep inspiration.  D-dimer was elevated.  CT angiogram of the chest to rule out PE.  She was evaluated by PT OT and they recommended discharging to SNF.  She then complained of bilateral lower extremity pain  today so vascular ultrasound was done and she was ruled out of DVT.  Per PT recommendation, she is going to be discharged to skilled nursing facility in stable condition.  I have discussed in length with her daughter Baldo Ash, the plan of care.  She verbalized understanding.  She is in agreement.  Of note, I have discontinued her Crestor for now due to rhabdomyolysis, she can be reevaluated for starting of any other statins.  Discharge Diagnoses:  Principal Problem:   Weakness Active Problems:   Atrial fibrillation (HCC)   Parkinson's disease ([place])   Dehydration   Rhabdomyolysis   Acute lower UTI    Discharge Instructions  Discharge Instructions    Discharge patient   Complete by: As directed    Discharge disposition: 03-Skilled [place]   Discharge patient date: 09/14/2018     Allergies as of 09/14/2018      Reactions   Doxycycline Nausea And Vomiting   Sulfonamide Derivatives    REACTION: HIVES      Medication List    STOP taking these medications   rosuvastatin 10 MG tablet Commonly known as: CRESTOR     TAKE these medications   calcium carbonate 750 MG chewable tablet Commonly known as: TUMS EX Chew 1 tablet by mouth daily.   carbidopa-levodopa 25-100 MG tablet Commonly known as: SINEMET IR 1 tablet 4 x a day.  Patient may take extra tablet if needed. What changed:   how much to take  how to take this  when to take this  additional instructions   cephALEXin 250 MG capsule Commonly known as: KEFLEX Take 1  capsule (250 mg total) by mouth 3 (three) times daily for 3 days.   cholecalciferol 10 MCG (400 UNIT) Tabs tablet Commonly known as: VITAMIN D3 Take 400 Units by mouth daily.   diltiazem 180 MG 24 hr capsule Commonly known as: CARDIZEM CD Take 1 capsule (180 mg total) by mouth daily.   ferrous sulfate 325 (65 FE) MG EC tablet Take 325 mg by mouth See admin instructions. Takes Monday and Friday   rivaroxaban 20 MG Tabs tablet Commonly  known as: Xarelto Take 1 tablet (20 mg total) by mouth daily.      Follow-up Information    [name], [name], MD Follow up in 1 week(s).   Specialty: Family Medicine Contact information: [place] [address] [phone]        [name], [name], MD .   Specialty: Cardiology Contact information: [address] [place] [address] [phone]          Allergies  Allergen Reactions  . Doxycycline Nausea And Vomiting  . Sulfonamide Derivatives     REACTION: HIVES    Consultations: None   Procedures/Studies: Ct Head Wo Contrast  Result Date: 09/11/2018 CLINICAL DATA:  Head trauma, on anticoagulation EXAM: CT HEAD WITHOUT CONTRAST TECHNIQUE: Contiguous axial images were obtained from the base of the skull through the vertex without intravenous contrast. COMPARISON:  04/29/2015 FINDINGS: Brain: No acute intracranial abnormality. Specifically, no hemorrhage, hydrocephalus, mass lesion, acute infarction, or significant intracranial injury. Vascular: No hyperdense vessel or unexpected calcification. Skull: No acute calvarial abnormality. Sinuses/Orbits: Visualized paranasal sinuses and mastoids clear. Orbital soft tissues unremarkable. Other: None IMPRESSION: No acute intracranial abnormality. Electronically Signed   By: Rolm Baptise M.D.   On: 09/11/2018 23:53   Ct Angio Chest Pe W Or Wo Contrast  Result Date: 09/13/2018 CLINICAL DATA:  PE suspected EXAM: CT ANGIOGRAPHY CHEST WITH CONTRAST TECHNIQUE: Multidetector CT imaging of the chest was performed using the standard protocol during bolus administration of intravenous contrast. Multiplanar CT image reconstructions and MIPs were obtained to evaluate the vascular anatomy. CONTRAST:  31mL OMNIPAQUE IOHEXOL 300 MG/ML  SOLN COMPARISON:  Chest radiograph, 09/12/2018, CT chest, 09/24/2016 FINDINGS: Cardiovascular: Satisfactory opacification of the pulmonary arteries to the segmental level. No  evidence of pulmonary embolism. Cardiomegaly. Scattered left coronary artery calcifications. No pericardial effusion. Aortic atherosclerosis. Mediastinum/Nodes: No enlarged mediastinal, hilar, or axillary lymph nodes. There is an exophytic 3.1 cm nodule posteriorly from the right lobe of the thyroid, unchanged from prior examination. Trachea, and esophagus demonstrate no significant findings. Lungs/Pleura: Small bilateral pleural effusions and associated atelectasis or consolidation Upper Abdomen: No acute abnormality. Musculoskeletal: No chest wall abnormality. No acute or significant osseous findings. Review of the MIP images confirms the above findings. IMPRESSION: 1.  Negative examination for pulmonary embolism. 2. Small bilateral pleural effusions and associated atelectasis or consolidation. 3. Cardiomegaly, coronary artery disease and aortic atherosclerosis. 4. Unchanged 3.1 cm exophytic nodule of the right lobe of the thyroid. Electronically Signed   By: Eddie Candle M.D.   On: 09/13/2018 12:00   Mr Cervical Spine Wo Contrast  Result Date: 09/12/2018 CLINICAL DATA:  Neck pain, initial exam Radiculopathy neck pain, weakness in legs, inability to walk. Intermittent neck pain. Personal history of Parkinson's. EXAM: MRI CERVICAL SPINE WITHOUT CONTRAST TECHNIQUE: Multiplanar, multisequence MR imaging of the cervical spine was performed. No intravenous contrast was administered. COMPARISON:  CT of the cervical spine 06/17/2010 FINDINGS: Alignment: Exaggerated cervical lordosis is present. There is slight  anterolisthesis at C4-5 and C7-T1. No other significant listhesis is present in the cervical spine. Vertebrae: Mild endplate marrow changes are present at C5-6. Marrow signal and vertebral body heights are otherwise normal. Skull base is normal. Cord: Normal signal is present in the cervical and upper thoracic spinal cord to the lowest imaged level, T3-4. Posterior Fossa, vertebral arteries, paraspinal tissues:  Craniocervical junction is normal. Flow is present in the vertebral arteries bilaterally. Visualized intracranial contents are normal. Disc levels: C2-3: Negative. C3-4: Asymmetric right-sided facet hypertrophy is present. There is no significant stenosis. C4-5: There is mild facet hypertrophy bilaterally without significant stenosis. C5-6: A broad-based disc osteophyte complex is present. Bilateral facet hypertrophy is noted. Central canal is patent. Moderate foraminal narrowing is worse right than left due to uncovertebral disease. C6-7: Minimal left-sided uncovertebral spurring is present without significant stenosis. C7-T1: Negative. IMPRESSION: 1. No acute or focal lesion to explain inability to walk weakness in legs. 2. Chronic loss of disc height and endplate degenerative change at C5-6 with moderate foraminal narrowing, right greater than left, secondary to uncovertebral and facet disease. 3. No significant central canal stenosis. 4. No other significant foraminal narrowing in the cervical spine. Electronically Signed   By: San Morelle M.D.   On: 09/12/2018 07:32   Mr Lumbar Spine Wo Contrast  Result Date: 09/12/2018 CLINICAL DATA:  Back pain, > 6wks conservative tx, persistent sx back pain, weakness of the legs, inability to walk. EXAM: MRI LUMBAR SPINE WITHOUT CONTRAST TECHNIQUE: Multiplanar, multisequence MR imaging of the lumbar spine was performed. No intravenous contrast was administered. COMPARISON:  None. FINDINGS: Segmentation: 5 non rib-bearing lumbar type vertebral bodies are present. The lowest fully formed vertebral body is L5. Alignment: Normal lumbar lordosis is present. There is no significant listhesis or scoliosis. Vertebrae:  Marrow signal and vertebral body heights are normal. Conus medullaris and cauda equina: Conus extends to the T12 level. Conus and cauda equina appear normal. Paraspinal and other soft tissues: A 9 mm simple cyst is present in the medial aspect of the  right kidney. A 3.5 cm right adnexal cyst is present. The urinary bladder is mildly distended. Disc levels: T12-L1: Negative. L1-2: Negative. L2-3: Mild facet hypertrophy is present bilaterally. There is some disc bulging. No significant stenosis is present. L3-4: Moderate facet hypertrophy is present. There is mild disc bulging without significant stenosis. L4-5: Mild facet hypertrophy is present bilaterally. There is no focal disc protrusion or stenosis. L5-S1: Mild facet hypertrophy is present bilaterally. There is no focal disc protrusion or stenosis. IMPRESSION: 1. Mild moderate facet hypertrophy in the lumbar spine without significant disc disease or focal stenosis to explain the patient's inability to walk or lower extremity weakness. 2. 3.5 cm right adnexal cyst. This lesion. Benign. Recommend follow-up ultrasound at 6-12 months for further evaluation. This recommendation follows ACR consensus guidelines: White Paper of the ACR Incidental Findings Committee II on Adnexal Findings. J Am Coll Radiol [phone]. Electronically Signed   By: San Morelle M.D.   On: 09/12/2018 08:00   US Pelvis Transvaginal Non-ob (tv Only)  Result Date: 08/31/2018 Ultrasound transvaginal shows uterus normal size and echotexture.  Endometrial echo 1.8 mm.  Right ovary with adjacent thin-walled echo-free avascular cyst 32 x 25 x 33 mm.  Left ovary not visualized transabdominally or transvaginally.  Cul-de-sac negative  Dg Chest Port 1 View  Result Date: 09/12/2018 CLINICAL DATA:  Weakness. EXAM: PORTABLE CHEST 1 VIEW COMPARISON:  09/17/2016 FINDINGS: Stable cardiomegaly. Unchanged mediastinal contours with aortic atherosclerosis.  Biapical pleuroparenchymal scarring. No acute airspace disease, pulmonary edema, pleural fluid or pneumothorax. EKG lead overlies the chest. No acute osseous abnormalities are seen. IMPRESSION: Stable cardiomegaly without acute abnormality. Aortic Atherosclerosis (ICD10-I70.0).  Electronically Signed   By: Keith Rake M.D.   On: 09/12/2018 03:24   Vas [place] Lower Extremity Venous (dvt)  Result Date: 09/14/2018  Lower Venous Study Indications: Elevated Ddimer.  Risk Factors: None identified. Comparison Study: No prior studies. Performing Technologist: Oliver Hum RVT  Examination Guidelines: A complete evaluation includes B-mode imaging, spectral Doppler, color Doppler, and power Doppler as needed of all accessible portions of each vessel. Bilateral testing is considered an integral part of a complete examination. Limited examinations for reoccurring indications may be performed as noted.  +---------+---------------+---------+-----------+----------+--------------+ RIGHT    CompressibilityPhasicitySpontaneityPropertiesThrombus Aging +---------+---------------+---------+-----------+----------+--------------+ CFV      Full           Yes      Yes                                 +---------+---------------+---------+-----------+----------+--------------+ SFJ      Full                                                        +---------+---------------+---------+-----------+----------+--------------+ FV Prox  Full                                                        +---------+---------------+---------+-----------+----------+--------------+ FV Mid   Full                                                        +---------+---------------+---------+-----------+----------+--------------+ FV DistalFull                                                        +---------+---------------+---------+-----------+----------+--------------+ PFV      Full                                                        +---------+---------------+---------+-----------+----------+--------------+ POP      Full           Yes      Yes                                 +---------+---------------+---------+-----------+----------+--------------+ PTV      Full                                                         +---------+---------------+---------+-----------+----------+--------------+  PERO     Full                                                        +---------+---------------+---------+-----------+----------+--------------+   +---------+---------------+---------+-----------+----------+--------------+ LEFT     CompressibilityPhasicitySpontaneityPropertiesThrombus Aging +---------+---------------+---------+-----------+----------+--------------+ CFV      Full           Yes      Yes                                 +---------+---------------+---------+-----------+----------+--------------+ SFJ      Full                                                        +---------+---------------+---------+-----------+----------+--------------+ FV Prox  Full                                                        +---------+---------------+---------+-----------+----------+--------------+ FV Mid   Full                                                        +---------+---------------+---------+-----------+----------+--------------+ FV DistalFull                                                        +---------+---------------+---------+-----------+----------+--------------+ PFV      Full                                                        +---------+---------------+---------+-----------+----------+--------------+ POP      Full           Yes      Yes                                 +---------+---------------+---------+-----------+----------+--------------+ PTV      Full                                                        +---------+---------------+---------+-----------+----------+--------------+ PERO     Full                                                        +---------+---------------+---------+-----------+----------+--------------+  Summary: Right: There is no evidence of deep vein thrombosis in the  lower extremity. No cystic structure found in the popliteal fossa. Left: There is no evidence of deep vein thrombosis in the lower extremity. No cystic structure found in the popliteal fossa.  *See table(s) above for measurements and observations.    Preliminary       Discharge Exam: Vitals:   09/13/18 2032 09/14/18 0539  BP: 99/62 117/76  Pulse: 72 80  Resp: 14 15  Temp: 97.7 F (36.5 C) 98.9 F (37.2 C)  SpO2: 95% 93%   Vitals:   09/13/18 0639 09/13/18 1328 09/13/18 2032 09/14/18 0539  BP: 118/78 104/62 99/62 117/76  Pulse: 71 75 72 80  Resp: 15 18 14 15   Temp: (!) 97.5 F (36.4 C) 98.4 F (36.9 C) 97.7 F (36.5 C) 98.9 F (37.2 C)  TempSrc: Oral Oral Oral Oral  SpO2: 95% 95% 95% 93%  Weight:      Height:        General: Pt is alert, awake, not in acute distress Cardiovascular: RRR, S1/S2 +, no rubs, no gallops Respiratory: CTA bilaterally, no wheezing, no rhonchi Abdominal: Soft, NT, ND, bowel sounds + Extremities: no edema, no cyanosis    The results of significant diagnostics from this hospitalization (including imaging, microbiology, ancillary and laboratory) are listed below for reference.     Microbiology: Recent Results (from the past 240 hour(s))  Novel Coronavirus, NAA (send-out to ref lab)     Status: None   Collection Time: 09/12/18  1:11 AM   Specimen: Nasopharyngeal Swab; Respiratory  Result Value Ref Range Status   SARS-CoV-2, NAA NOT DETECTED NOT DETECTED Final    Comment: (NOTE) This test was developed and its performance characteristics determined by [employer]. This test has not been FDA cleared or approved. This test has been authorized by FDA under an Emergency Use Authorization (EUA). This test is only authorized for the duration of time the declaration that circumstances exist justifying the authorization of the emergency use of in vitro diagnostic tests for detection of SARS-CoV-2 virus and/or diagnosis of COVID-19 infection  under section 564(b)(1) of the Act, 21 U.S.C. KA:123727), unless the authorization is terminated or revoked sooner. When diagnostic testing is negative, the possibility of a false negative result should be considered in the context of a patient's recent exposures and the presence of clinical signs and symptoms consistent with COVID-19. An individual without symptoms of COVID-19 and who is not shedding SARS-CoV-2 virus would expect to have a negative (not detected) result in this assay. Performed  At: [facility] [address] [place], [place] [id number] Rush Farmer MD [id number]    [place]  Final    Comment: Performed at [facility], [place] [address] [place], [place] [address]     Labs: BNP (last 3 results) No results for input(s): BNP in the last 8760 hours. Basic Metabolic Panel: Recent Labs  Lab 09/11/18 2105 09/12/18 0304 09/13/18 0201 09/14/18 0043  NA 139 140 140 140  K 3.7 3.6 3.8 3.9  CL 106 108 112* 111  CO2 22 21* 22 23  GLUCOSE 99 89 95 109*  BUN 29* 27* 24* 15  CREATININE 0.98 0.88 0.75 0.68  CALCIUM 9.9 9.2 8.4* 8.5*  MG  --   --  2.0  --    Liver Function Tests: Recent Labs  Lab 09/12/18 0304 09/13/18 0201  AST 60* 50*  ALT 7 7  ALKPHOS 45 36*  BILITOT 1.1 0.6  PROT 6.2* 4.9*  ALBUMIN 3.7 2.8*   No results for input(s): LIPASE, AMYLASE in the last 168 hours. No results for input(s): AMMONIA in the last 168 hours. CBC: Recent Labs  Lab 09/11/18 2105 09/12/18 0304 09/13/18 0201  WBC 10.6* 8.0 8.9  NEUTROABS 9.4*  --  7.1  HGB 12.6 11.5* 10.5*  HCT 40.1 35.2* 32.4*  MCV 96.4 94.4 93.9  PLT 170 150 125*   Cardiac Enzymes: Recent Labs  Lab 09/12/18 0304 09/13/18 0201 09/14/18 0043  CKTOTAL 1,900* 1,099* 599*   BNP: Invalid input(s): POCBNP CBG: No results for input(s): GLUCAP in the last 168 hours. D-Dimer Recent Labs    09/13/18 0923  DDIMER 1.11*   Hgb A1c No results for  input(s): HGBA1C in the last 72 hours. Lipid Profile No results for input(s): CHOL, HDL, LDLCALC, TRIG, CHOLHDL, LDLDIRECT in the last 72 hours. Thyroid function studies Recent Labs    09/11/18 2105  TSH 0.408   Anemia work up Recent Labs    09/12/18 0304  VITAMINB12 1,217*   Urinalysis    Component Value Date/Time   COLORURINE AMBER (A) 09/12/2018 1235   APPEARANCEUR CLOUDY (A) 09/12/2018 1235   LABSPEC 1.022 09/12/2018 1235   PHURINE 8.0 09/12/2018 1235   GLUCOSEU NEGATIVE 09/12/2018 1235   GLUCOSEU NEGATIVE 08/12/2018 1100   HGBUR MODERATE (A) 09/12/2018 1235   HGBUR negative 02/09/2007 0801   BILIRUBINUR NEGATIVE 09/12/2018 1235   BILIRUBINUR negative 03/19/2017 1559   KETONESUR 20 (A) 09/12/2018 1235   PROTEINUR >=300 (A) 09/12/2018 1235   UROBILINOGEN 0.2 08/12/2018 1100   NITRITE NEGATIVE 09/12/2018 1235   LEUKOCYTESUR MODERATE (A) 09/12/2018 1235   Sepsis Labs Invalid input(s): PROCALCITONIN,  WBC,  LACTICIDVEN Microbiology Recent Results (from the past 240 hour(s))  Novel Coronavirus, NAA (send-out to ref lab)     Status: None   Collection Time: 09/12/18  1:11 AM   Specimen: Nasopharyngeal Swab; Respiratory  Result Value Ref Range Status   SARS-CoV-2, NAA NOT DETECTED NOT DETECTED Final    Comment: (NOTE) This test was developed and its performance characteristics determined by [employer]. This test has not been FDA cleared or approved. This test has been authorized by FDA under an Emergency Use Authorization (EUA). This test is only authorized for the duration of time the declaration that circumstances exist justifying the authorization of the emergency use of in vitro diagnostic tests for detection of SARS-CoV-2 virus and/or diagnosis of COVID-19 infection under section 564(b)(1) of the Act, 21 U.S.C. KA:123727), unless the authorization is terminated or revoked sooner. When diagnostic testing is negative, the possibility of a false  negative result should be considered in the context of a patient's recent exposures and the presence of clinical signs and symptoms consistent with COVID-19. An individual without symptoms of COVID-19 and who is not shedding SARS-CoV-2 virus would expect to have a negative (not detected) result in this assay. Performed  At: [facility] [address] [place], [place] [id number] Rush Farmer MD [id number]    [place]  Final    Comment: Performed at [facility], [place] [address], [place], [place] [address]     Time coordinating discharge: Over 30 minutes  SIGNED:   Darliss Cheney, MD  [facility] 09/14/2018, 11:21 AM Pager [phone]  If 7PM-7AM, please contact night-coverage [email or web address] Password [email or web address]

## 2018-09-14 NOTE — Telephone Encounter (Signed)
called spoke with daughter patient was transported to Rehab today. PT, OT, ST 5 days a week for the next 2 weeks. Pt daughter is wondering if patient can have VV sooner then 09/24/18. I informed her that there are no avaibale slot prior to 09/24/18 to work patient in.  Pt daughter is wondering if Dr. Carles Collet can make recommendation to maximize her therapy possily changing medication to help with her physically.   Pt fell 3 times witihin 24 hours   Pt d/c summary has patient on carbidopa 25/100 1 4 times a day and to take extra if needed  Pt last visit in office she was to take 1.5 tablet 4 times day. Pt daughter wondering if she can go back on the increase dose?  No longer on statin medication

## 2018-09-14 NOTE — Progress Notes (Signed)
Bilateral lower extremity venous duplex has been completed. Preliminary results can be found in CV Proc through chart review.  Results were given to the patient's nurse, Remo Lipps.  09/14/18 9:39 AM Stephanie Shaw RVT

## 2018-09-14 NOTE — Progress Notes (Signed)
Physical Therapy Treatment Patient Details Name: Stephanie Shaw MRN: [id number] DOB: August 01, 1940 Today's Date: 09/14/2018    History of Present Illness Patient is a 78 y/o female who presents with weakness and loss of appetite. PMH includes PD, paroxsymal A-fib.    PT Comments    Continuing work on functional mobility and activity tolerance;  Able to take a few steps today with mod assist to steady and chair push behind to boost confidence and safety; She seemed encouraged at being able to walk in the room; Overall progressing well; Anticipate continuing good progress at post-acute rehabilitation.    Follow Up Recommendations  SNF;Supervision for mobility/OOB;Supervision/Assistance - 24 hour     Equipment Recommendations  None recommended by PT    Recommendations for Other Services       Precautions / Restrictions Precautions Precautions: Fall Precaution Comments: multiple falls at home; PD    Mobility  Bed Mobility Overal bed mobility: Needs Assistance Bed Mobility: Rolling;Sidelying to Sit Rolling: Min assist Sidelying to sit: Mod assist;HOB elevated       General bed mobility comments: Step by step cues; assist with BLEs and to reach for rail, assist with trunk. Very slow, impaired initation.  Transfers Overall transfer level: Needs assistance Equipment used: Rolling [place] (2 wheeled) Transfers: Sit to/from Stand Sit to Stand: Mod assist         General transfer comment: Light mod assist to power up, and mod assist to stabilize while she moved her hands  Ambulation/Gait Ambulation/Gait assistance: Mod assist;+2 safety/equipment Gait Distance (Feet): 6 Feet Assistive device: Rolling [place] (2 wheeled) Gait Pattern/deviations: Decreased step length - right;Decreased step length - left;Trunk flexed     General Gait Details: Short, inefficint steps, but di dnot need physical assist to advance; tremulous at end of short bout, but no overt knee  buckling   Stairs             Wheelchair Mobility    Modified Rankin (Stroke Patients Only)       Balance     Sitting balance-Leahy Scale: Fair       Standing balance-Leahy Scale: Poor                              Cognition Arousal/Alertness: Awake/alert Behavior During Therapy: WFL for tasks assessed/performed Overall Cognitive Status: Within Functional Limits for tasks assessed                                        Exercises      General Comments        Pertinent Vitals/Pain Pain Assessment: No/denies pain    Home Living Family/patient expects to be discharged to:: Private residence Living Arrangements: Alone                  Prior Function            PT Goals (current goals can now be found in the care plan section) Acute Rehab PT Goals Patient Stated Goal: go to SNF and get stronger PT Goal Formulation: With patient Time For Goal Achievement: 09/26/18 Potential to Achieve Goals: Fair Progress towards PT goals: Progressing toward goals(slowly)    Frequency    Min 3X/week      PT Plan Current plan remains appropriate    Co-evaluation  AM-PAC PT "6 Clicks" Mobility   Outcome Measure  Help needed turning from your back to your side while in a flat bed without using bedrails?: A Little Help needed moving from lying on your back to sitting on the side of a flat bed without using bedrails?: A Lot Help needed moving to and from a bed to a chair (including a wheelchair)?: A Lot Help needed standing up from a chair using your arms (e.g., wheelchair or bedside chair)?: A Lot Help needed to walk in hospital room?: A Lot Help needed climbing 3-5 steps with a railing? : Total 6 Click Score: 12    End of Session Equipment Utilized During Treatment: Gait belt Activity Tolerance: Patient tolerated treatment well Patient left: in chair;with call bell/phone within reach;with chair alarm  set Nurse Communication: Mobility status PT Visit Diagnosis: Unsteadiness on feet (R26.81);Muscle weakness (generalized) (M62.81)     Time: [id number] PT Time Calculation (min) (ACUTE ONLY): 16 min  Charges:  $Gait Training: 8-22 mins                     Roney [place], [place]  Acute Rehabilitation Services Pager [phone] Office [phone]    Colletta [place] 09/14/2018, 2:33 PM

## 2018-09-14 NOTE — TOC Progression Note (Addendum)
Transition of Care [facility]) - Progression Note    Patient Details  Name: SARENITY HAITZ MRN: [id number] Date of Birth: 28-Jul-1940  [facility] CM/SW Contact  Jacalyn Lefevre Edson Snowball, RN Phone Number: 09/14/2018, 11:28 AM  Clinical Narrative:     Plan for patient to discharge to [facility] acuity unit , [address] entrance [address] second floor [address].   Spoke with Raquel Sarna at [facility]. Raquel Sarna has covid test results 8/23 not detected. Will need discharge summary sent to her prior to discharge today. Discharge summary completed and sent to [facility] .  Nurse can call report to [phone] ext [phone] if no answer [phone] and ask for the nurse for the acuity unit.   Bedside nurse will call report to [facility]. Patient will be ready for transport at 2 pm. [facility] and requested 2 pm.   Patient's daughter Boyce Medici aware.   Expected Discharge Plan: [place] Barriers to Discharge: Ship broker, Continued Medical Work up, Other (comment)(Awaiting COVID test)  Expected Discharge Plan and Services Expected Discharge Plan: [place] In-house Referral: Clinical Social Work Discharge Planning Services: NA Post Acute Care Choice: [place] Living arrangements for the past 2 months: [place] Expected Discharge Date: 09/14/18               DME Arranged: N/A DME Agency: NA       HH Arranged: NA HH Agency: NA         Social Determinants of Health (SDOH) Interventions    Readmission Risk Interventions No flowsheet data found.

## 2018-09-14 NOTE — Telephone Encounter (Signed)
shes going to rehab (in patient).   she should be taking carbidopa/levodopa 25/100, 1.5 tablets qid as directed at last visit (7am/11am/3pm/7pm).  Her visit with me is in a week.  I'm assuming that she will potentially still be an in patient though so make sure that SNF can accomodate VV if she will still be there.

## 2018-09-14 NOTE — Discharge Instructions (Signed)
Rhabdomyolysis °Rhabdomyolysis is a condition that happens when muscle cells break down and release substances into the blood that can damage the kidneys. This condition happens because of damage to the muscles that move bones (skeletal muscle). When the skeletal muscles are damaged, substances inside the muscle cells go into the blood. One of these substances is a protein called myoglobin. °Large amounts of myoglobin can cause kidney damage or kidney failure. Other substances that are released by muscle cells may upset the balance of the minerals (electrolytes) in your blood. This imbalance causes your blood to have too much acid (acidosis). °What are the causes? °This condition is caused by muscle damage. Muscle damage often happens because of: °· Using your muscles too much. °· An injury that crushes or squeezes a muscle too tightly. °· Using illegal drugs, mainly cocaine. °· Alcohol abuse. °Other possible causes include: °· Prescription medicines, such as those that: °? Lower cholesterol (statins). °? Treat ADHD (attention deficit hyperactivity disorder) or help with weight loss (amphetamines). °? Treat pain (opiates). °· Infections. °· Muscle diseases that are passed down from parent to child (inherited). °· High fever. °· Heatstroke. °· Not having enough fluids in your body (dehydration). °· Seizures. °· Surgery. °What increases the risk? °This condition is more likely to develop in people who: °· Have a family history of muscle disease. °· Take part in extreme sports, such as running in marathons. °· Have diabetes. °· Are older. °· Abuse drugs or alcohol. °What are the signs or symptoms? °Symptoms of this condition vary. Some people have very few symptoms, and other people have many symptoms. The most common symptoms include: °· Muscle pain and swelling. °· Weak muscles. °· Dark urine. °· Feeling weak and tired. °Other symptoms include: °· Nausea and vomiting. °· Fever. °· Pain in the abdomen. °· Pain in the  joints. °Symptoms of complications from this condition include: °· Heart rhythm that is not normal (arrhythmia). °· Seizures. °· Not urinating enough because of kidney failure. °· Very low blood pressure (shock). Signs of shock include dizziness, blurry vision, and clammy skin. °· Bleeding that is hard to stop or control. °How is this diagnosed? °This condition is diagnosed based on your medical history, your symptoms, and a physical exam. Tests may also be done, including: °· Blood tests. °· Urine tests to check for myoglobin. °You may also have other tests to check for causes of muscle damage and to check for complications. °How is this treated? °Treatment for this condition helps to: °· Make sure you have enough fluids in your body. °· Lower the acid levels in your blood to reverse acidosis. °· Protect your kidneys. °Treatment may include: °· Fluids and medicines given through an IV tube that is inserted into one of your veins. °· Medicines to lower acidosis or to bring back the balance of the minerals in your body. °· Hemodialysis. This treatment uses an artificial kidney machine to filter your blood while you recover. You may have this if other treatments are not helping. °Follow these instructions at home: ° °· Take over-the-counter and prescription medicines only as told by your health care provider. °· Rest at home until your health care provider says that you can return to your normal activities. °· Drink enough fluid to keep your urine clear or pale yellow. °· Do not do activities that take a lot of effort (are strenuous). Ask your health care provider what level of exercise is safe for you. °· Do not abuse drugs or   alcohol. If you are having problems with drug or alcohol use, ask your health care provider for help.  Keep all follow-up visits as told by your health care provider. This is important. Contact a health care provider if:  You start having symptoms of this condition after treatment. Get  help right away if:  You have a seizure.  You bleed easily or cannot control bleeding.  You cannot urinate.  You have chest pain.  You have trouble breathing. This information is not intended to replace advice given to you by your health care provider. Make sure you discuss any questions you have with your health care provider. Document Released: 12/20/2003 Document Revised: 12/19/2016 Document Reviewed: 10/19/2015 Elsevier Patient Education  2020 [employer]  

## 2018-09-14 NOTE — Telephone Encounter (Signed)
Patient daughter called and would like to speak to someone about her mothers care after hospital stay please call

## 2018-09-14 NOTE — Progress Notes (Signed)
Patient discharged to [facility] as ordered via [facility] transporter, report was given to nurse Arcelia Jew, pt's daughter is aware.

## 2018-09-15 ENCOUNTER — Non-Acute Institutional Stay (SKILLED_NURSING_FACILITY): Payer: Medicare Other | Admitting: Nurse Practitioner

## 2018-09-15 ENCOUNTER — Encounter: Payer: Self-pay | Admitting: Nurse Practitioner

## 2018-09-15 DIAGNOSIS — N949 Unspecified condition associated with female genital organs and menstrual cycle: Secondary | ICD-10-CM | POA: Diagnosis not present

## 2018-09-15 DIAGNOSIS — I4891 Unspecified atrial fibrillation: Secondary | ICD-10-CM

## 2018-09-15 DIAGNOSIS — M159 Polyosteoarthritis, unspecified: Secondary | ICD-10-CM

## 2018-09-15 DIAGNOSIS — N390 Urinary tract infection, site not specified: Secondary | ICD-10-CM

## 2018-09-15 DIAGNOSIS — G20 Parkinson's disease: Secondary | ICD-10-CM | POA: Diagnosis not present

## 2018-09-15 NOTE — Assessment & Plan Note (Signed)
Complete 3 day cours of Keflex 250mg  tid, will f/u urine culture when available.

## 2018-09-15 NOTE — Assessment & Plan Note (Signed)
No apparent tremor, continue f/u neurology, Sinemet 25/100mg  11/2 tab qid.

## 2018-09-15 NOTE — Telephone Encounter (Signed)
Called spoke with [place] at [facility] [phone] she was informed about dosing to patient medication. They will make sure she is getting her correct dose of 1.5 tablet QID of carbidopa-levodopa   We also discuss her VV on 09/24/18 she will have Rose,DOA call office to set up contact information for VV.  Pt daughter informed of this as well

## 2018-09-15 NOTE — Assessment & Plan Note (Signed)
Multiple sites, will have Tylenol 1000mg  tid prn available to her. Observe.

## 2018-09-15 NOTE — Assessment & Plan Note (Signed)
Heart rate is in control, continue Cardizem 180mg  qd, Xarelto 20mg  qd.

## 2018-09-15 NOTE — Progress Notes (Signed)
Location:  [facility] Room Number: [place] of Service:  SNF (31) Provider: Zakee Deerman, ManXie  NP  Shaw, Stephanie G, MD  Patient Care Team: Shaw, Stephanie G, MD as PCP - General (Family Medicine) [name], [name], MD as PCP - Cardiology (Cardiology) [name], [name], DO as Consulting Physician (Neurology) Enedina Pair X, NP as Nurse Practitioner (Internal Medicine)  Extended Emergency Contact Information Primary Emergency Contact: [facility] Address: [address]          [name],  [address] [name] Phone: [phone] Mobile Phone: [phone] Relation: Son Secondary Emergency Contact: Babby, [place] Mobile Phone: [phone] Relation: Daughter Interpreter needed? No  Code Status:  Full Code  Goals of care: Advanced Directive information Advanced Directives 09/14/2018  Does Patient Have a Medical Advance Directive? -  Type of Advance Directive -  Copy of [place] in Chart? -  Would patient like information on creating a medical advance directive? No - Patient declined     Chief Complaint  Patient presents with   Acute Visit    Medication    HPI:  Pt is a 78 y.o. female seen today for an acute visit for aches all over since the fall. She desires Tylenol 1000mg  tid prn. Hx of AFib, heart rate is in control. CBC/BMP 1 wk pending. On Xarelto 20mg  qd. Cardizem 180mg  qd.   Hx of Parkinson's disease, taking Sinemet 25/100mg  11/2 tab qid, next neurology appointment 09/24/18  The patient was hospitalized 09/11/18-09/14/18 for generalized weakness, s/p fall x 2, she was found elevated CK, possible rhabdomyolysis, trended down from 1900s to 500s. She had chest pain with elevated D dimer but CT angiogram r/o PE, off Statin in hospital. She is taking Keflex 250mg  tid x 3 days, last dose 09/17/18 for UTI, UA 09/12/18 showed many bacteria, wbc >50, culture is not available.    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 Administer 650mg  every four hours PRN for minor pain/headache x 48 hours. Notify MD of continued pain/headache. Do not exceed 3,000 mg in 24 hours. Document area of discomfort and effectiveness of medication.   calcium carbonate (TUMS EX) 750 MG chewable tablet Chew 1 tablet by mouth daily.   carbidopa-levodopa (SINEMET IR) 25-100 MG tablet 1 tablet 4 x a day.  Patient may take extra tablet if needed. (Patient taking differently: Take 1.5 tablets by mouth 4 (four) times daily. Taken at 0800,1100, 1400, 1700)   cephALEXin (KEFLEX) 250 MG capsule Take 1 capsule (250 mg total) by mouth  3 (three) times daily for 3 days.   diltiazem (CARDIZEM CD) 180 MG 24 hr capsule Take 1 capsule (180 mg total) by mouth daily.   rivaroxaban (XARELTO) 20 MG TABS tablet Take 1 tablet (20 mg total) by mouth daily.   [DISCONTINUED] cholecalciferol (VITAMIN D) 400 units TABS tablet Take 400 Units by mouth daily.   [DISCONTINUED] ferrous sulfate 325 (65 FE) MG EC tablet Take 325 mg by mouth See admin instructions. Takes Monday and Friday   No facility-administered  encounter medications on file as of 09/15/2018.     Review of Systems  Constitutional: Negative for activity change, appetite change, chills, diaphoresis, fatigue, fever and unexpected weight change.  HENT: Positive for hearing loss. Negative for congestion and voice change.   Respiratory: Negative for cough, shortness of breath and wheezing.   Cardiovascular: Negative for chest pain, palpitations and leg swelling.  Gastrointestinal: Negative for abdominal distention, abdominal pain, constipation, diarrhea, nausea and vomiting.  Genitourinary: Negative for difficulty urinating, dysuria and urgency.  Musculoskeletal: Positive for arthralgias, gait problem and myalgias. Negative for neck pain and neck stiffness.  Neurological: Negative for dizziness, tremors, facial asymmetry, speech difficulty, weakness, numbness and headaches.  Psychiatric/Behavioral: Positive for sleep disturbance. Negative for agitation, behavioral problems and hallucinations. The patient is not nervous/anxious.        Aches pain at night, not sleeping well, tylenol helps.     Immunization History  Administered Date(s) Administered   Influenza Split 10/20/2012   Influenza Whole 10/21/2006   Influenza, High Dose Seasonal PF 10/17/2016   Influenza-Unspecified 10/20/2013, 10/22/2017   Pneumococcal Conjugate-13 01/31/2013   Pneumococcal Polysaccharide-23 01/20/2005, 11/24/2011   Td 01/21/2004   Tdap 03/16/2014   Zoster 04/17/2009   Pertinent  Health Maintenance Due  Topic Date Due   INFLUENZA VACCINE  08/21/2018   COLONOSCOPY  06/12/2019   DEXA SCAN  Completed   PNA vac Low Risk Adult  Completed   Fall Risk  05/03/2018 11/03/2017 08/04/2017 03/06/2017 11/26/2016  Falls in the past year? 1 No No Yes Yes  Number falls in past yr: 0 - - 2 or more 2 or more  Injury with Fall? 0 - - No No  Risk Factor Category  - - - High Fall Risk High Fall Risk  Risk for fall due to : Other (Comment) - - - -  Follow up  Falls evaluation completed;Education provided;Falls prevention discussed - - Falls evaluation completed Falls evaluation completed   Functional Status Survey:    Vitals:   09/15/18 1102  BP: 122/80  Pulse: 80  Resp: (!) 22  Temp: 98.4 F (36.9 C)  SpO2: 93%  Weight: 140 lb (63.5 kg)   Body mass index is 21.29 kg/m. Physical Exam Vitals signs and nursing note reviewed.  Constitutional:      General: She is not in acute distress.    Appearance: Normal appearance. She is normal weight. She is not ill-appearing, toxic-appearing or diaphoretic.  HENT:     Head: Normocephalic and atraumatic.     Nose: Nose normal.     Mouth/Throat:     Mouth: Mucous membranes are moist.  Eyes:     Extraocular Movements: Extraocular movements intact.     Conjunctiva/sclera: Conjunctivae normal.     Pupils: Pupils are equal, round, and reactive to light.  Neck:     Musculoskeletal: Normal range of motion and neck supple.  Cardiovascular:     Rate and Rhythm: Normal rate. Rhythm irregular.     Heart sounds:  No murmur.  Pulmonary:     Breath sounds: No wheezing, rhonchi or rales.  Abdominal:     General: There is no distension.     Palpations: Abdomen is soft.     Tenderness: There is no abdominal tenderness. There is no right CVA tenderness, left CVA tenderness, guarding or rebound.  Musculoskeletal:     Right lower leg: No edema.     Left lower leg: No edema.     Comments: Ambulates with [place].   Skin:    General: Skin is warm and dry.  Neurological:     General: No focal deficit present.     Mental Status: She is alert and oriented to person, place, and time. Mental status is at baseline.     Cranial Nerves: No cranial nerve deficit.     Motor: No weakness.     Coordination: Coordination normal.     Gait: Gait abnormal.  Psychiatric:        Mood and Affect: Mood normal.        Behavior: Behavior normal.        Thought Content: Thought content normal.        Judgment: Judgment  normal.     Labs reviewed: Recent Labs    09/12/18 0304 09/13/18 0201 09/14/18 0043  NA 140 140 140  K 3.6 3.8 3.9  CL 108 112* 111  CO2 21* 22 23  GLUCOSE 89 95 109*  BUN 27* 24* 15  CREATININE 0.88 0.75 0.68  CALCIUM 9.2 8.4* 8.5*  MG  --  2.0  --    Recent Labs    09/12/18 0304 09/13/18 0201  AST 60* 50*  ALT 7 7  ALKPHOS 45 36*  BILITOT 1.1 0.6  PROT 6.2* 4.9*  ALBUMIN 3.7 2.8*   Recent Labs    09/11/18 2105 09/12/18 0304 09/13/18 0201  WBC 10.6* 8.0 8.9  NEUTROABS 9.4*  --  7.1  HGB 12.6 11.5* 10.5*  HCT 40.1 35.2* 32.4*  MCV 96.4 94.4 93.9  PLT 170 150 125*   Lab Results  Component Value Date   TSH 0.408 09/11/2018   No results found for: HGBA1C Lab Results  Component Value Date   CHOL 199 08/29/2015   HDL 88.20 08/29/2015   LDLCALC 103 (H) 08/29/2015   LDLDIRECT 104.2 01/25/2013   TRIG 42.0 08/29/2015   CHOLHDL 2 08/29/2015    Significant Diagnostic Results in last 30 days:  Ct Head Wo Contrast  Result Date: 09/11/2018 CLINICAL DATA:  Head trauma, on anticoagulation EXAM: CT HEAD WITHOUT CONTRAST TECHNIQUE: Contiguous axial images were obtained from the base of the skull through the vertex without intravenous contrast. COMPARISON:  04/29/2015 FINDINGS: Brain: No acute intracranial abnormality. Specifically, no hemorrhage, hydrocephalus, mass lesion, acute infarction, or significant intracranial injury. Vascular: No hyperdense vessel or unexpected calcification. Skull: No acute calvarial abnormality. Sinuses/Orbits: Visualized paranasal sinuses and mastoids clear. Orbital soft tissues unremarkable. Other: None IMPRESSION: No acute intracranial abnormality. Electronically Signed   By: Rolm Baptise M.D.   On: 09/11/2018 23:53   Ct Angio Chest Pe W Or Wo Contrast  Result Date: 09/13/2018 CLINICAL DATA:  PE suspected EXAM: CT ANGIOGRAPHY CHEST WITH CONTRAST TECHNIQUE: Multidetector CT imaging of the chest was performed using the standard protocol  during bolus administration of intravenous contrast. Multiplanar CT image reconstructions and MIPs were obtained to evaluate the vascular anatomy. CONTRAST:  66mL OMNIPAQUE IOHEXOL 300 MG/ML  SOLN COMPARISON:  Chest radiograph, 09/12/2018, CT chest, 09/24/2016 FINDINGS: Cardiovascular:  Satisfactory opacification of the pulmonary arteries to the segmental level. No evidence of pulmonary embolism. Cardiomegaly. Scattered left coronary artery calcifications. No pericardial effusion. Aortic atherosclerosis. Mediastinum/Nodes: No enlarged mediastinal, hilar, or axillary lymph nodes. There is an exophytic 3.1 cm nodule posteriorly from the right lobe of the thyroid, unchanged from prior examination. Trachea, and esophagus demonstrate no significant findings. Lungs/Pleura: Small bilateral pleural effusions and associated atelectasis or consolidation Upper Abdomen: No acute abnormality. Musculoskeletal: No chest wall abnormality. No acute or significant osseous findings. Review of the MIP images confirms the above findings. IMPRESSION: 1.  Negative examination for pulmonary embolism. 2. Small bilateral pleural effusions and associated atelectasis or consolidation. 3. Cardiomegaly, coronary artery disease and aortic atherosclerosis. 4. Unchanged 3.1 cm exophytic nodule of the right lobe of the thyroid. Electronically Signed   By: Eddie Candle M.D.   On: 09/13/2018 12:00   Mr Cervical Spine Wo Contrast  Result Date: 09/12/2018 CLINICAL DATA:  Neck pain, initial exam Radiculopathy neck pain, weakness in legs, inability to walk. Intermittent neck pain. Personal history of Parkinson's. EXAM: MRI CERVICAL SPINE WITHOUT CONTRAST TECHNIQUE: Multiplanar, multisequence MR imaging of the cervical spine was performed. No intravenous contrast was administered. COMPARISON:  CT of the cervical spine 06/17/2010 FINDINGS: Alignment: Exaggerated cervical lordosis is present. There is slight anterolisthesis at C4-5 and C7-T1. No other  significant listhesis is present in the cervical spine. Vertebrae: Mild endplate marrow changes are present at C5-6. Marrow signal and vertebral body heights are otherwise normal. Skull base is normal. Cord: Normal signal is present in the cervical and upper thoracic spinal cord to the lowest imaged level, T3-4. Posterior Fossa, vertebral arteries, paraspinal tissues: Craniocervical junction is normal. Flow is present in the vertebral arteries bilaterally. Visualized intracranial contents are normal. Disc levels: C2-3: Negative. C3-4: Asymmetric right-sided facet hypertrophy is present. There is no significant stenosis. C4-5: There is mild facet hypertrophy bilaterally without significant stenosis. C5-6: A broad-based disc osteophyte complex is present. Bilateral facet hypertrophy is noted. Central canal is patent. Moderate foraminal narrowing is worse right than left due to uncovertebral disease. C6-7: Minimal left-sided uncovertebral spurring is present without significant stenosis. C7-T1: Negative. IMPRESSION: 1. No acute or focal lesion to explain inability to walk weakness in legs. 2. Chronic loss of disc height and endplate degenerative change at C5-6 with moderate foraminal narrowing, right greater than left, secondary to uncovertebral and facet disease. 3. No significant central canal stenosis. 4. No other significant foraminal narrowing in the cervical spine. Electronically Signed   By: San Morelle M.D.   On: 09/12/2018 07:32   Mr Lumbar Spine Wo Contrast  Result Date: 09/12/2018 CLINICAL DATA:  Back pain, > 6wks conservative tx, persistent sx back pain, weakness of the legs, inability to walk. EXAM: MRI LUMBAR SPINE WITHOUT CONTRAST TECHNIQUE: Multiplanar, multisequence MR imaging of the lumbar spine was performed. No intravenous contrast was administered. COMPARISON:  None. FINDINGS: Segmentation: 5 non rib-bearing lumbar type vertebral bodies are present. The lowest fully formed vertebral  body is L5. Alignment: Normal lumbar lordosis is present. There is no significant listhesis or scoliosis. Vertebrae:  Marrow signal and vertebral body heights are normal. Conus medullaris and cauda equina: Conus extends to the T12 level. Conus and cauda equina appear normal. Paraspinal and other soft tissues: A 9 mm simple cyst is present in the medial aspect of the right kidney. A 3.5 cm right adnexal cyst is present. The urinary bladder is mildly distended. Disc levels: T12-L1: Negative. L1-2: Negative. L2-3: Mild facet  hypertrophy is present bilaterally. There is some disc bulging. No significant stenosis is present. L3-4: Moderate facet hypertrophy is present. There is mild disc bulging without significant stenosis. L4-5: Mild facet hypertrophy is present bilaterally. There is no focal disc protrusion or stenosis. L5-S1: Mild facet hypertrophy is present bilaterally. There is no focal disc protrusion or stenosis. IMPRESSION: 1. Mild moderate facet hypertrophy in the lumbar spine without significant disc disease or focal stenosis to explain the patient's inability to walk or lower extremity weakness. 2. 3.5 cm right adnexal cyst. This lesion. Benign. Recommend follow-up ultrasound at 6-12 months for further evaluation. This recommendation follows ACR consensus guidelines: White Paper of the ACR Incidental Findings Committee II on Adnexal Findings. J Am Coll Radiol [phone]. Electronically Signed   By: San Morelle M.D.   On: 09/12/2018 08:00   US Pelvis Transvaginal Non-ob (tv Only)  Result Date: 08/31/2018 Ultrasound transvaginal shows uterus normal size and echotexture.  Endometrial echo 1.8 mm.  Right ovary with adjacent thin-walled echo-free avascular cyst 32 x 25 x 33 mm.  Left ovary not visualized transabdominally or transvaginally.  Cul-de-sac negative  Dg Chest Port 1 View  Result Date: 09/12/2018 CLINICAL DATA:  Weakness. EXAM: PORTABLE CHEST 1 VIEW COMPARISON:  09/17/2016  FINDINGS: Stable cardiomegaly. Unchanged mediastinal contours with aortic atherosclerosis. Biapical pleuroparenchymal scarring. No acute airspace disease, pulmonary edema, pleural fluid or pneumothorax. EKG lead overlies the chest. No acute osseous abnormalities are seen. IMPRESSION: Stable cardiomegaly without acute abnormality. Aortic Atherosclerosis (ICD10-I70.0). Electronically Signed   By: Keith Rake M.D.   On: 09/12/2018 03:24   Vas [place] Lower Extremity Venous (dvt)  Result Date: 09/14/2018  Lower Venous Study Indications: Elevated Ddimer.  Risk Factors: None identified. Comparison Study: No prior studies. Performing Technologist: Oliver Hum RVT  Examination Guidelines: A complete evaluation includes B-mode imaging, spectral Doppler, color Doppler, and power Doppler as needed of all accessible portions of each vessel. Bilateral testing is considered an integral part of a complete examination. Limited examinations for reoccurring indications may be performed as noted.  +---------+---------------+---------+-----------+----------+--------------+  RIGHT     Compressibility Phasicity Spontaneity Properties Thrombus Aging  +---------+---------------+---------+-----------+----------+--------------+  CFV       Full            Yes       Yes                                    +---------+---------------+---------+-----------+----------+--------------+  SFJ       Full                                                             +---------+---------------+---------+-----------+----------+--------------+  FV Prox   Full                                                             +---------+---------------+---------+-----------+----------+--------------+  FV Mid    Full                                                             +---------+---------------+---------+-----------+----------+--------------+  FV Distal Full                                                              +---------+---------------+---------+-----------+----------+--------------+  PFV       Full                                                             +---------+---------------+---------+-----------+----------+--------------+  POP       Full            Yes       Yes                                    +---------+---------------+---------+-----------+----------+--------------+  PTV       Full                                                             +---------+---------------+---------+-----------+----------+--------------+  PERO      Full                                                             +---------+---------------+---------+-----------+----------+--------------+   +---------+---------------+---------+-----------+----------+--------------+  LEFT      Compressibility Phasicity Spontaneity Properties Thrombus Aging  +---------+---------------+---------+-----------+----------+--------------+  CFV       Full            Yes       Yes                                    +---------+---------------+---------+-----------+----------+--------------+  SFJ       Full                                                             +---------+---------------+---------+-----------+----------+--------------+  FV Prox   Full                                                             +---------+---------------+---------+-----------+----------+--------------+  FV Mid    Full                                                             +---------+---------------+---------+-----------+----------+--------------+  FV Distal Full                                                             +---------+---------------+---------+-----------+----------+--------------+  PFV       Full                                                             +---------+---------------+---------+-----------+----------+--------------+  POP       Full            Yes       Yes                                     +---------+---------------+---------+-----------+----------+--------------+  PTV       Full                                                             +---------+---------------+---------+-----------+----------+--------------+  PERO      Full                                                             +---------+---------------+---------+-----------+----------+--------------+     Summary: Right: There is no evidence of deep vein thrombosis in the lower extremity. No cystic structure found in the popliteal fossa. Left: There is no evidence of deep vein thrombosis in the lower extremity. No cystic structure found in the popliteal fossa.  *See table(s) above for measurements and observations. Electronically signed by Deitra Mayo MD on 09/14/2018 at 1:24:07 PM.    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 Observe.   Atrial fibrillation (HCC) Heart rate is in control, continue Cardizem 180mg  qd, Xarelto 20mg  qd.   Parkinson's disease ([place]) No apparent tremor, continue f/u neurology, Sinemet 25/100mg  11/2 tab qid.   Acute lower UTI Complete 3 day cours of Keflex 250mg  tid, will f/u urine culture when available.      Family/ staff Communication: plan of care reviewed with the patient and charge nurse   Labs/tests ordered:  Pending CBC/BMP   Time spend 35 minutes.

## 2018-09-15 NOTE — Assessment & Plan Note (Signed)
R, incidental finding on MR L spine 09/12/18, 3.5cm, f/u US 6-12 months

## 2018-09-16 ENCOUNTER — Non-Acute Institutional Stay (SKILLED_NURSING_FACILITY): Payer: Medicare Other | Admitting: Internal Medicine

## 2018-09-16 ENCOUNTER — Encounter: Payer: Self-pay | Admitting: Internal Medicine

## 2018-09-16 DIAGNOSIS — I4891 Unspecified atrial fibrillation: Secondary | ICD-10-CM

## 2018-09-16 DIAGNOSIS — G20 Parkinson's disease: Secondary | ICD-10-CM | POA: Diagnosis not present

## 2018-09-16 DIAGNOSIS — N390 Urinary tract infection, site not specified: Secondary | ICD-10-CM | POA: Diagnosis not present

## 2018-09-16 DIAGNOSIS — M6282 Rhabdomyolysis: Secondary | ICD-10-CM

## 2018-09-16 DIAGNOSIS — R600 Localized edema: Secondary | ICD-10-CM

## 2018-09-16 DIAGNOSIS — R2681 Unsteadiness on feet: Secondary | ICD-10-CM

## 2018-09-16 LAB — ACETYLCHOLINE RECEPTOR AB, ALL
Acety choline binding ab: <0.03 nmol/L (ref 0.00–0.24)
Acetylchol Block Ab: 11 % (ref 0–25)
Acetylcholine Modulat Ab: <12 % (ref 0–20)

## 2018-09-16 NOTE — Progress Notes (Signed)
Provider:  Veleta Miners  MD Location:  [facility] Room Number: [id number] [facility] Place of Service:  ALF [phone]  PCP: [name], [name], MD Patient Care Team: [name], [name], MD as PCP - General (Family Medicine) [name], [name], MD as PCP - Cardiology (Cardiology) [name], [name], DO as Consulting Physician (Neurology) Mast, Man X, NP as Nurse Practitioner (Internal Medicine)  Extended Emergency Contact Information Primary Emergency Contact: [facility] Address: [address]          [place], [place] [address] [name] of [place] Phone: [phone] Mobile Phone: [phone] Relation: Son Secondary Emergency Contact: Jakera, Maga Mobile Phone: [phone] Relation: Daughter Interpreter needed? No  Code Status: Full Code Goals of Care: Advanced Directive information Advanced Directives 09/14/2018  Does Patient Have a Medical Advance Directive? -  Type of Advance Directive -  Copy of [place] in Chart? -  Would patient like information on creating a medical advance directive? No - Patient declined      Chief Complaint  Patient presents with   New Admit To SNF    HPI: Patient is a 78 y.o. female seen today for admission to SNF for therapy She was in the hospital from 08/22-08/25 for Recurrent Fall, UTI and Rhabdomyolysis  Patient has a history of Parkinson disease diagnosed 2 years ago on Sinemet and follows with Dr. Carles Collet, paroxysmal A. fib on Xarelto, hyperlipidemia, H/o Ovarian Cyst, Lower extremity edema,2 D echo with N EF Biatrial enlargement  Patient lives by herself in [place].  She states that she had 2 back-to-back falls.  With every fall she stayed on the floor for some time before calling for help as she felt that she could get up by herself.  Both her fall seems more like she lost her balance.  She denied any dizziness with these falls.  After her second fall in nursing the facility decided to send her to ED.  She was  admitted and was found to have elevated CK level. She had also had ultrasound of the lower extremity which was negative for DVT.  She had CT of her head and MRI of her spine.  It showed chronic degenerative changes with no acute process to explain her weakness in lower extremities. Her Crestor was discontinued due to elevated CK. She also was treated for UTI  In the patient is now in SNF for therapy.  She continues to be very weak in her proximal muscle and unable to ambulate by herself.  She also had some pain in her groin area which was relieved with Tylenol.  She continues to have urinary frequency and is very concerned about having UTI again  Before this fall patient was very independent and was driving also.  Her daughter who is actively involved in her care lives in [place].    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 She has never used smokeless tobacco. She reports that she does not drink alcohol or use drugs. Social History   Socioeconomic History   Marital status: Divorced    Spouse name: Not on file   Number of children: Not on file   Years of education: Not on file   Highest education level: Not on file  Occupational History   Occupation: retired    Comment: math, Hydrologist  Social Designer, fashion/clothing strain: Not on file   Food insecurity     Worry: Not on file    Inability: Not on file   Transportation needs    Medical: Not on file    Non-medical: Not on file  Tobacco Use   Smoking status: Former Smoker    Quit date: 08/27/1971    Years since quitting: 47.0   Smokeless tobacco: Never Used  Substance and Sexual Activity   Alcohol use: No    Alcohol/week: 0.0 standard drinks   Drug use: No   Sexual activity: Not Currently    Comment: 1st intercourse 78 yo-Fewer than 5 partners  Lifestyle   Physical activity    Days per week: Not on file    Minutes per session: Not on file   Stress: Not on file  Relationships   Social connections    Talks on phone: Not on file    Gets together: Not on file    Attends religious service: Not on file    Active member of club or organization: Not on file    Attends meetings of clubs or organizations: Not on file    Relationship status: Not on file   Intimate partner violence    Fear of current or ex partner: Not on file    Emotionally abused: Not on file    Physically abused: Not on file    Forced sexual activity: Not on file  Other Topics Concern   Not on file  Social History Narrative   Not on file    Functional Status Survey:    Family History  Problem Relation Age of Onset   Cancer Father        ? type   Alcohol abuse Father    Cancer Mother        Pancreatic   Diabetes Mother    Heart failure Mother    Osteoarthritis Sister    Breast cancer Neg Hx     Health Maintenance  Topic Date Due   INFLUENZA VACCINE  08/21/2018   COLONOSCOPY  06/12/2019   TETANUS/TDAP  03/16/2024   DEXA SCAN  Completed   PNA vac Low Risk Adult  Completed    Allergies  Allergen Reactions   Doxycycline Nausea And Vomiting   Sulfonamide Derivatives     REACTION: HIVES    Outpatient Encounter Medications as of 09/16/2018  Medication Sig   acetaminophen (TYLENOL) 325 MG tablet Take 650 mg by mouth every 8 (eight) hours as needed.    calcium carbonate (TUMS  EX) 750 MG chewable tablet Chew 1 tablet by mouth daily.   carbidopa-levodopa (SINEMET IR) 25-100 MG tablet 1 tablet 4 x a day.  Patient may take extra tablet if needed. (Patient taking differently: Take 1.5 tablets by mouth 4 (four) times daily. Taken at 0800,1100, 1400, 1700)   cephALEXin (KEFLEX) 250 MG capsule Take 1 capsule (250 mg total) by mouth 3 (three) times daily for 3  days.   Cholecalciferol (VITAMIN D3) 10 MCG (400 UNIT) CAPS Take 1 capsule by mouth daily.   diltiazem (CARDIZEM CD) 180 MG 24 hr capsule Take 1 capsule (180 mg total) by mouth daily.   rivaroxaban (XARELTO) 20 MG TABS tablet Take 1 tablet (20 mg total) by mouth daily.   No facility-administered encounter medications on file as of 09/16/2018.     Review of Systems  Constitutional: Positive for activity change.  HENT: Negative.   Respiratory: Negative.   Cardiovascular: Positive for leg swelling.  Gastrointestinal: Negative.   Genitourinary: Negative.   Musculoskeletal: Positive for myalgias.  Skin: Negative.   Neurological: Positive for weakness.  Psychiatric/Behavioral: Negative.     Vitals:   09/16/18 1055  BP: 116/62  Pulse: 97  Resp: 19  Temp: 98.4 F (36.9 C)  SpO2: 93%  Weight: 140 lb (63.5 kg)   Body mass index is 21.29 kg/m. Physical Exam Vitals signs reviewed.  Constitutional:      Appearance: Normal appearance.  HENT:     Head: Normocephalic.     Nose: Nose normal.     Mouth/Throat:     Mouth: Mucous membranes are moist.     Pharynx: Oropharynx is clear.  Eyes:     Pupils: Pupils are equal, round, and reactive to light.  Neck:     Musculoskeletal: Neck supple.  Cardiovascular:     Rate and Rhythm: Normal rate. Rhythm irregular.     Pulses: Normal pulses.  Pulmonary:     Effort: Pulmonary effort is normal. No respiratory distress.     Breath sounds: Normal breath sounds.  Abdominal:     General: Abdomen is flat. Bowel sounds are normal.     Palpations: Abdomen is soft.    Musculoskeletal:     Comments: Moderate Swelling Bilateral  Skin:    General: Skin is warm and dry.  Neurological:     Mental Status: She is alert.     Comments: Patietn has Masked face. Mild tremor in both Hands Very slow Gait. Took lot of time to get up from her Chair     Labs reviewed: Basic Metabolic Panel: Recent Labs    09/12/18 0304 09/13/18 0201 09/14/18 0043  NA 140 140 140  K 3.6 3.8 3.9  CL 108 112* 111  CO2 21* 22 23  GLUCOSE 89 95 109*  BUN 27* 24* 15  CREATININE 0.88 0.75 0.68  CALCIUM 9.2 8.4* 8.5*  MG  --  2.0  --    Liver Function Tests: Recent Labs    09/12/18 0304 09/13/18 0201  AST 60* 50*  ALT 7 7  ALKPHOS 45 36*  BILITOT 1.1 0.6  PROT 6.2* 4.9*  ALBUMIN 3.7 2.8*   No results for input(s): LIPASE, AMYLASE in the last 8760 hours. No results for input(s): AMMONIA in the last 8760 hours. CBC: Recent Labs    09/11/18 2105 09/12/18 0304 09/13/18 0201  WBC 10.6* 8.0 8.9  NEUTROABS 9.4*  --  7.1  HGB 12.6 11.5* 10.5*  HCT 40.1 35.2* 32.4*  MCV 96.4 94.4 93.9  PLT 170 150 125*   Cardiac Enzymes: Recent Labs    09/12/18 0304 09/13/18 0201 09/14/18 0043  CKTOTAL 1,900* 1,099* 599*   BNP: Invalid input(s): POCBNP No results found for: HGBA1C Lab Results  Component Value Date   TSH 0.408 09/11/2018   Lab Results  Component Value Date   VITAMINB12 1,217 (H) 09/12/2018   No results found for: FOLATE No results found for:  IRON, TIBC, FERRITIN  Imaging and Procedures obtained prior to SNF admission: Ct Head Wo Contrast  Result Date: 09/11/2018 CLINICAL DATA:  Head trauma, on anticoagulation EXAM: CT HEAD WITHOUT CONTRAST TECHNIQUE: Contiguous axial images were obtained from the base of the skull through the vertex without intravenous contrast. COMPARISON:  04/29/2015 FINDINGS: Brain: No acute intracranial abnormality. Specifically, no hemorrhage, hydrocephalus, mass lesion, acute infarction, or significant intracranial injury.  Vascular: No hyperdense vessel or unexpected calcification. Skull: No acute calvarial abnormality. Sinuses/Orbits: Visualized paranasal sinuses and mastoids clear. Orbital soft tissues unremarkable. Other: None IMPRESSION: No acute intracranial abnormality. Electronically Signed   By: Rolm Baptise M.D.   On: 09/11/2018 23:53   Mr Cervical Spine Wo Contrast  Result Date: 09/12/2018 CLINICAL DATA:  Neck pain, initial exam Radiculopathy neck pain, weakness in legs, inability to walk. Intermittent neck pain. Personal history of Parkinson's. EXAM: MRI CERVICAL SPINE WITHOUT CONTRAST TECHNIQUE: Multiplanar, multisequence MR imaging of the cervical spine was performed. No intravenous contrast was administered. COMPARISON:  CT of the cervical spine 06/17/2010 FINDINGS: Alignment: Exaggerated cervical lordosis is present. There is slight anterolisthesis at C4-5 and C7-T1. No other significant listhesis is present in the cervical spine. Vertebrae: Mild endplate marrow changes are present at C5-6. Marrow signal and vertebral body heights are otherwise normal. Skull base is normal. Cord: Normal signal is present in the cervical and upper thoracic spinal cord to the lowest imaged level, T3-4. Posterior Fossa, vertebral arteries, paraspinal tissues: Craniocervical junction is normal. Flow is present in the vertebral arteries bilaterally. Visualized intracranial contents are normal. Disc levels: C2-3: Negative. C3-4: Asymmetric right-sided facet hypertrophy is present. There is no significant stenosis. C4-5: There is mild facet hypertrophy bilaterally without significant stenosis. C5-6: A broad-based disc osteophyte complex is present. Bilateral facet hypertrophy is noted. Central canal is patent. Moderate foraminal narrowing is worse right than left due to uncovertebral disease. C6-7: Minimal left-sided uncovertebral spurring is present without significant stenosis. C7-T1: Negative. IMPRESSION: 1. No acute or focal lesion to  explain inability to walk weakness in legs. 2. Chronic loss of disc height and endplate degenerative change at C5-6 with moderate foraminal narrowing, right greater than left, secondary to uncovertebral and facet disease. 3. No significant central canal stenosis. 4. No other significant foraminal narrowing in the cervical spine. Electronically Signed   By: San Morelle M.D.   On: 09/12/2018 07:32   Mr Lumbar Spine Wo Contrast  Result Date: 09/12/2018 CLINICAL DATA:  Back pain, > 6wks conservative tx, persistent sx back pain, weakness of the legs, inability to walk. EXAM: MRI LUMBAR SPINE WITHOUT CONTRAST TECHNIQUE: Multiplanar, multisequence MR imaging of the lumbar spine was performed. No intravenous contrast was administered. COMPARISON:  None. FINDINGS: Segmentation: 5 non rib-bearing lumbar type vertebral bodies are present. The lowest fully formed vertebral body is L5. Alignment: Normal lumbar lordosis is present. There is no significant listhesis or scoliosis. Vertebrae:  Marrow signal and vertebral body heights are normal. Conus medullaris and cauda equina: Conus extends to the T12 level. Conus and cauda equina appear normal. Paraspinal and other soft tissues: A 9 mm simple cyst is present in the medial aspect of the right kidney. A 3.5 cm right adnexal cyst is present. The urinary bladder is mildly distended. Disc levels: T12-L1: Negative. L1-2: Negative. L2-3: Mild facet hypertrophy is present bilaterally. There is some disc bulging. No significant stenosis is present. L3-4: Moderate facet hypertrophy is present. There is mild disc bulging without significant stenosis. L4-5: Mild facet hypertrophy is present  bilaterally. There is no focal disc protrusion or stenosis. L5-S1: Mild facet hypertrophy is present bilaterally. There is no focal disc protrusion or stenosis. IMPRESSION: 1. Mild moderate facet hypertrophy in the lumbar spine without significant disc disease or focal stenosis to explain  the patient's inability to walk or lower extremity weakness. 2. 3.5 cm right adnexal cyst. This lesion. Benign. Recommend follow-up ultrasound at 6-12 months for further evaluation. This recommendation follows ACR consensus guidelines: White Paper of the ACR Incidental Findings Committee II on Adnexal Findings. J Am Coll Radiol [phone]. Electronically Signed   By: San Morelle M.D.   On: 09/12/2018 08:00   Dg Chest Port 1 View  Result Date: 09/12/2018 CLINICAL DATA:  Weakness. EXAM: PORTABLE CHEST 1 VIEW COMPARISON:  09/17/2016 FINDINGS: Stable cardiomegaly. Unchanged mediastinal contours with aortic atherosclerosis. Biapical pleuroparenchymal scarring. No acute airspace disease, pulmonary edema, pleural fluid or pneumothorax. EKG lead overlies the chest. No acute osseous abnormalities are seen. IMPRESSION: Stable cardiomegaly without acute abnormality. Aortic Atherosclerosis (ICD10-I70.0). Electronically Signed   By: Keith Rake M.D.   On: 09/12/2018 03:24    Assessment/Plan Atrial fibrillation, unspecified type ([place]) On Xarelto Rate Control on Cardizem  Parkinson's disease ([place]) On Sinemet Working with therapy. Very weak in Proximal muscle and slow gait Her Fall seems related to Parkinson. D/W the daughter she will need Higher level of care most likely. But reval in 2 weeks  Non-traumatic rhabdomyolysis Thought to be due to Falls Crestor was discontinued for now Daughter wants to Talk to Cardiologist before restarting it Acute lower UTI Was treated with Cipro and then Keflex in the hospital  D/W the daughter if her symptoms persists will repeat UA and possible referral to Urology  Unstable gait with Falls Working with therapy  Bilateral lower extremity edema Patient was on lasix before. Does not want to try it right now     Family/ staff Communication:   Labs/tests ordered: Repeat CBC and BMP  Total time spent in this patient care encounter was 45 _   minutes; greater than 50% of the visit spent counseling patient and staff, reviewing records , Labs and coordinating care for problems addressed at this encounter.

## 2018-09-17 ENCOUNTER — Encounter: Payer: Self-pay | Admitting: Internal Medicine

## 2018-09-17 ENCOUNTER — Non-Acute Institutional Stay (SKILLED_NURSING_FACILITY): Payer: Medicare Other | Admitting: Internal Medicine

## 2018-09-17 DIAGNOSIS — I4891 Unspecified atrial fibrillation: Secondary | ICD-10-CM | POA: Diagnosis not present

## 2018-09-17 DIAGNOSIS — I959 Hypotension, unspecified: Secondary | ICD-10-CM | POA: Diagnosis not present

## 2018-09-17 DIAGNOSIS — R600 Localized edema: Secondary | ICD-10-CM | POA: Diagnosis not present

## 2018-09-17 DIAGNOSIS — R2681 Unsteadiness on feet: Secondary | ICD-10-CM

## 2018-09-17 DIAGNOSIS — G20 Parkinson's disease: Secondary | ICD-10-CM

## 2018-09-17 NOTE — Progress Notes (Signed)
Location:  [facility] Room Number: [place] of Service:  SNF [phone]) Provider:  Veleta Miners  MD  [name], MD  Patient Care Team: [name], MD as PCP - General (Internal Medicine) [name], [name], MD as PCP - Cardiology (Cardiology) [name], [name], DO as Consulting Physician (Neurology) Mast, Man X, NP as Nurse Practitioner (Internal Medicine)  Extended Emergency Contact Information Primary Emergency Contact: [facility] Address: [address]          [place], [place] [address] [name] of [place] Phone: [phone] Mobile Phone: [phone] Relation: Son Secondary Emergency Contact: Kestra, [place] Mobile Phone: [phone] Relation: Daughter Interpreter needed? No  Code Status:  Full Code Goals of care: Advanced Directive information Advanced Directives 09/14/2018  Does Patient Have a Medical Advance Directive? -  Type of Advance Directive -  Copy of [place] in Chart? -  Would patient like information on creating a medical advance directive? No - Patient declined     Chief Complaint  Patient presents with   Acute Visit    C/o - low BP    HPI:  Pt is a 78 y.o. female seen today for an acute visit for her BP running Low She was in the hospital from 08/22-08/25 for Recurrent Fall, UTI and Rhabdomyolysis  Patient has a history of Parkinson disease diagnosed 2 years ago on Sinemet and follows with Dr. Carles Collet, paroxysmal A. fib on Xarelto, hyperlipidemia, H/o Ovarian Cyst, Lower extremity edema,2 D echo with N EF Biatrial enlargement  She is working with the therapy Nurses have noticed that her BP is running low and they are holding her Cardizem Patient herself denies Dizziness.  Her only complain is LE edema especially since she is working with Therapy. 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   calcium carbonate (TUMS EX) 750 MG chewable tablet Chew 1 tablet by mouth daily.   carbidopa-levodopa (SINEMET IR) 25-100 MG tablet Take 1.5 tablets by mouth 4 (four) times daily.   cephALEXin (KEFLEX) 250 MG capsule Take 1 capsule (250 mg total) by mouth 3 (three) times daily for 3 days.   Cholecalciferol (VITAMIN D3) 10 MCG (400 UNIT) CAPS Take 1 capsule by mouth daily.   diltiazem (CARDIZEM CD) 180 MG 24 hr capsule Take 1 capsule (180 mg total) by mouth daily.   ferrous sulfate 325 (65 FE) MG tablet Take 325 mg by mouth. Once A Day on Mon, Fri   magnesium hydroxide (MILK OF MAGNESIA)  400 MG/5ML suspension Take 30 mLs by mouth daily as needed for mild constipation.   rivaroxaban (XARELTO) 20 MG TABS tablet Take 1 tablet (20 mg total) by mouth daily. (Patient taking differently: Take 20 mg by mouth at bedtime. )   [DISCONTINUED] acetaminophen (TYLENOL) 325 MG tablet Take 650 mg by mouth every 8 (eight) hours as needed.    [DISCONTINUED] carbidopa-levodopa (SINEMET IR) 25-100 MG tablet 1 tablet 4 x a day.  Patient may take extra tablet if needed. (Patient taking differently: Take 1.5 tablets by mouth 4 (four)  times daily. Taken at 0800,1100, 1400, 1700)   No facility-administered encounter medications on file as of 09/17/2018.     Review of Systems  Constitutional: Negative.   HENT: Negative.   Respiratory: Negative.   Cardiovascular: Positive for leg swelling.  Genitourinary: Negative.   Musculoskeletal: Negative.   Skin: Negative.   Neurological: Positive for tremors and weakness.  Psychiatric/Behavioral: Negative.     Immunization History  Administered Date(s) Administered   Influenza Split 10/20/2012   Influenza Whole 10/21/2006   Influenza, High Dose Seasonal PF 10/17/2016   Influenza-Unspecified 10/20/2013, 10/22/2017   Pneumococcal Conjugate-13 01/31/2013   Pneumococcal Polysaccharide-23 01/20/2005, 11/24/2011   Td 01/21/2004   Tdap 03/16/2014   Zoster 04/17/2009   Pertinent  Health Maintenance Due  Topic Date Due   INFLUENZA VACCINE  08/21/2018   COLONOSCOPY  06/12/2019   DEXA SCAN  Completed   PNA vac Low Risk Adult  Completed   Fall Risk  05/03/2018 11/03/2017 08/04/2017 03/06/2017 11/26/2016  Falls in the past year? 1 No No Yes Yes  Number falls in past yr: 0 - - 2 or more 2 or more  Injury with Fall? 0 - - No No  Risk Factor Category  - - - High Fall Risk High Fall Risk  Risk for fall due to : Other (Comment) - - - -  Follow up Falls evaluation completed;Education provided;Falls prevention discussed - - Falls evaluation completed Falls evaluation completed   Functional Status Survey:    Vitals:   09/17/18 1205  BP: 90/60  Pulse: 94  Resp: 18  Temp: 97.7 F (36.5 C)  SpO2: 94%  Weight: 140 lb (63.5 kg)   Body mass index is 21.29 kg/m. Physical Exam Vitals signs reviewed.  HENT:     Head: Normocephalic.     Nose: Nose normal.     Mouth/Throat:     Mouth: Mucous membranes are moist.     Pharynx: Oropharynx is clear.  Eyes:     Pupils: Pupils are equal, round, and reactive to light.  Neck:     Musculoskeletal: Neck supple.    Cardiovascular:     Rate and Rhythm: Normal rate.     Pulses: Normal pulses.  Pulmonary:     Effort: Pulmonary effort is normal. No respiratory distress.     Breath sounds: Normal breath sounds. No wheezing.  Abdominal:     General: Abdomen is flat. Bowel sounds are normal.  Musculoskeletal:     Comments: Moderate Swelling Bilateral  Skin:    General: Skin is warm and dry.  Neurological:     General: No focal deficit present.     Mental Status: She is alert and oriented to person, place, and time.     Comments:  Patietn has Masked face. Mild tremor in both Hands Very slow Gait. Took lot of time to get up from her Chair      Labs reviewed: Recent Labs  09/12/18 0304 09/13/18 0201 09/14/18 0043  NA 140 140 140  K 3.6 3.8 3.9  CL 108 112* 111  CO2 21* 22 23  GLUCOSE 89 95 109*  BUN 27* 24* 15  CREATININE 0.88 0.75 0.68  CALCIUM 9.2 8.4* 8.5*  MG  --  2.0  --    Recent Labs    09/12/18 0304 09/13/18 0201  AST 60* 50*  ALT 7 7  ALKPHOS 45 36*  BILITOT 1.1 0.6  PROT 6.2* 4.9*  ALBUMIN 3.7 2.8*   Recent Labs    09/11/18 2105 09/12/18 0304 09/13/18 0201  WBC 10.6* 8.0 8.9  NEUTROABS 9.4*  --  7.1  HGB 12.6 11.5* 10.5*  HCT 40.1 35.2* 32.4*  MCV 96.4 94.4 93.9  PLT 170 150 125*   Lab Results  Component Value Date   TSH 0.408 09/11/2018   No results found for: HGBA1C Lab Results  Component Value Date   CHOL 199 08/29/2015   HDL 88.20 08/29/2015   LDLCALC 103 (H) 08/29/2015   LDLDIRECT 104.2 01/25/2013   TRIG 42.0 08/29/2015   CHOLHDL 2 08/29/2015    Significant Diagnostic Results in last 30 days:  Ct Head Wo Contrast  Result Date: 09/11/2018 CLINICAL DATA:  Head trauma, on anticoagulation EXAM: CT HEAD WITHOUT CONTRAST TECHNIQUE: Contiguous axial images were obtained from the base of the skull through the vertex without intravenous contrast. COMPARISON:  04/29/2015 FINDINGS: Brain: No acute intracranial abnormality. Specifically, no hemorrhage,  hydrocephalus, mass lesion, acute infarction, or significant intracranial injury. Vascular: No hyperdense vessel or unexpected calcification. Skull: No acute calvarial abnormality. Sinuses/Orbits: Visualized paranasal sinuses and mastoids clear. Orbital soft tissues unremarkable. Other: None IMPRESSION: No acute intracranial abnormality. Electronically Signed   By: Rolm Baptise M.D.   On: 09/11/2018 23:53   Ct Angio Chest Pe W Or Wo Contrast  Result Date: 09/13/2018 CLINICAL DATA:  PE suspected EXAM: CT ANGIOGRAPHY CHEST WITH CONTRAST TECHNIQUE: Multidetector CT imaging of the chest was performed using the standard protocol during bolus administration of intravenous contrast. Multiplanar CT image reconstructions and MIPs were obtained to evaluate the vascular anatomy. CONTRAST:  33mL OMNIPAQUE IOHEXOL 300 MG/ML  SOLN COMPARISON:  Chest radiograph, 09/12/2018, CT chest, 09/24/2016 FINDINGS: Cardiovascular: Satisfactory opacification of the pulmonary arteries to the segmental level. No evidence of pulmonary embolism. Cardiomegaly. Scattered left coronary artery calcifications. No pericardial effusion. Aortic atherosclerosis. Mediastinum/Nodes: No enlarged mediastinal, hilar, or axillary lymph nodes. There is an exophytic 3.1 cm nodule posteriorly from the right lobe of the thyroid, unchanged from prior examination. Trachea, and esophagus demonstrate no significant findings. Lungs/Pleura: Small bilateral pleural effusions and associated atelectasis or consolidation Upper Abdomen: No acute abnormality. Musculoskeletal: No chest wall abnormality. No acute or significant osseous findings. Review of the MIP images confirms the above findings. IMPRESSION: 1.  Negative examination for pulmonary embolism. 2. Small bilateral pleural effusions and associated atelectasis or consolidation. 3. Cardiomegaly, coronary artery disease and aortic atherosclerosis. 4. Unchanged 3.1 cm exophytic nodule of the right lobe of the  thyroid. Electronically Signed   By: Eddie Candle M.D.   On: 09/13/2018 12:00   Mr Cervical Spine Wo Contrast  Result Date: 09/12/2018 CLINICAL DATA:  Neck pain, initial exam Radiculopathy neck pain, weakness in legs, inability to walk. Intermittent neck pain. Personal history of Parkinson's. EXAM: MRI CERVICAL SPINE WITHOUT CONTRAST TECHNIQUE: Multiplanar, multisequence MR imaging of the cervical spine was performed. No intravenous contrast was administered. COMPARISON:  CT of the cervical spine 06/17/2010 FINDINGS: Alignment: Exaggerated  cervical lordosis is present. There is slight anterolisthesis at C4-5 and C7-T1. No other significant listhesis is present in the cervical spine. Vertebrae: Mild endplate marrow changes are present at C5-6. Marrow signal and vertebral body heights are otherwise normal. Skull base is normal. Cord: Normal signal is present in the cervical and upper thoracic spinal cord to the lowest imaged level, T3-4. Posterior Fossa, vertebral arteries, paraspinal tissues: Craniocervical junction is normal. Flow is present in the vertebral arteries bilaterally. Visualized intracranial contents are normal. Disc levels: C2-3: Negative. C3-4: Asymmetric right-sided facet hypertrophy is present. There is no significant stenosis. C4-5: There is mild facet hypertrophy bilaterally without significant stenosis. C5-6: A broad-based disc osteophyte complex is present. Bilateral facet hypertrophy is noted. Central canal is patent. Moderate foraminal narrowing is worse right than left due to uncovertebral disease. C6-7: Minimal left-sided uncovertebral spurring is present without significant stenosis. C7-T1: Negative. IMPRESSION: 1. No acute or focal lesion to explain inability to walk weakness in legs. 2. Chronic loss of disc height and endplate degenerative change at C5-6 with moderate foraminal narrowing, right greater than left, secondary to uncovertebral and facet disease. 3. No significant central  canal stenosis. 4. No other significant foraminal narrowing in the cervical spine. Electronically Signed   By: San Morelle M.D.   On: 09/12/2018 07:32   Mr Lumbar Spine Wo Contrast  Result Date: 09/12/2018 CLINICAL DATA:  Back pain, > 6wks conservative tx, persistent sx back pain, weakness of the legs, inability to walk. EXAM: MRI LUMBAR SPINE WITHOUT CONTRAST TECHNIQUE: Multiplanar, multisequence MR imaging of the lumbar spine was performed. No intravenous contrast was administered. COMPARISON:  None. FINDINGS: Segmentation: 5 non rib-bearing lumbar type vertebral bodies are present. The lowest fully formed vertebral body is L5. Alignment: Normal lumbar lordosis is present. There is no significant listhesis or scoliosis. Vertebrae:  Marrow signal and vertebral body heights are normal. Conus medullaris and cauda equina: Conus extends to the T12 level. Conus and cauda equina appear normal. Paraspinal and other soft tissues: A 9 mm simple cyst is present in the medial aspect of the right kidney. A 3.5 cm right adnexal cyst is present. The urinary bladder is mildly distended. Disc levels: T12-L1: Negative. L1-2: Negative. L2-3: Mild facet hypertrophy is present bilaterally. There is some disc bulging. No significant stenosis is present. L3-4: Moderate facet hypertrophy is present. There is mild disc bulging without significant stenosis. L4-5: Mild facet hypertrophy is present bilaterally. There is no focal disc protrusion or stenosis. L5-S1: Mild facet hypertrophy is present bilaterally. There is no focal disc protrusion or stenosis. IMPRESSION: 1. Mild moderate facet hypertrophy in the lumbar spine without significant disc disease or focal stenosis to explain the patient's inability to walk or lower extremity weakness. 2. 3.5 cm right adnexal cyst. This lesion. Benign. Recommend follow-up ultrasound at 6-12 months for further evaluation. This recommendation follows ACR consensus guidelines: White Paper of  the ACR Incidental Findings Committee II on Adnexal Findings. J Am Coll Radiol [phone]. Electronically Signed   By: San Morelle M.D.   On: 09/12/2018 08:00   US Pelvis Transvaginal Non-ob (tv Only)  Result Date: 08/31/2018 Ultrasound transvaginal shows uterus normal size and echotexture.  Endometrial echo 1.8 mm.  Right ovary with adjacent thin-walled echo-free avascular cyst 32 x 25 x 33 mm.  Left ovary not visualized transabdominally or transvaginally.  Cul-de-sac negative  Dg Chest Port 1 View  Result Date: 09/12/2018 CLINICAL DATA:  Weakness. EXAM: PORTABLE CHEST 1 VIEW COMPARISON:  09/17/2016 FINDINGS: Stable  cardiomegaly. Unchanged mediastinal contours with aortic atherosclerosis. Biapical pleuroparenchymal scarring. No acute airspace disease, pulmonary edema, pleural fluid or pneumothorax. EKG lead overlies the chest. No acute osseous abnormalities are seen. IMPRESSION: Stable cardiomegaly without acute abnormality. Aortic Atherosclerosis (ICD10-I70.0). Electronically Signed   By: Keith Rake M.D.   On: 09/12/2018 03:24   Vas [place] Lower Extremity Venous (dvt)  Result Date: 09/14/2018  Lower Venous Study Indications: Elevated Ddimer.  Risk Factors: None identified. Comparison Study: No prior studies. Performing Technologist: Oliver Hum RVT  Examination Guidelines: A complete evaluation includes B-mode imaging, spectral Doppler, color Doppler, and power Doppler as needed of all accessible portions of each vessel. Bilateral testing is considered an integral part of a complete examination. Limited examinations for reoccurring indications may be performed as noted.  +---------+---------------+---------+-----------+----------+--------------+  RIGHT     Compressibility Phasicity Spontaneity Properties Thrombus Aging  +---------+---------------+---------+-----------+----------+--------------+  CFV       Full            Yes       Yes                                     +---------+---------------+---------+-----------+----------+--------------+  SFJ       Full                                                             +---------+---------------+---------+-----------+----------+--------------+  FV Prox   Full                                                             +---------+---------------+---------+-----------+----------+--------------+  FV Mid    Full                                                             +---------+---------------+---------+-----------+----------+--------------+  FV Distal Full                                                             +---------+---------------+---------+-----------+----------+--------------+  PFV       Full                                                             +---------+---------------+---------+-----------+----------+--------------+  POP       Full            Yes       Yes                                    +---------+---------------+---------+-----------+----------+--------------+  PTV       Full                                                             +---------+---------------+---------+-----------+----------+--------------+  PERO      Full                                                             +---------+---------------+---------+-----------+----------+--------------+   +---------+---------------+---------+-----------+----------+--------------+  LEFT      Compressibility Phasicity Spontaneity Properties Thrombus Aging  +---------+---------------+---------+-----------+----------+--------------+  CFV       Full            Yes       Yes                                    +---------+---------------+---------+-----------+----------+--------------+  SFJ       Full                                                             +---------+---------------+---------+-----------+----------+--------------+  FV Prox   Full                                                              +---------+---------------+---------+-----------+----------+--------------+  FV Mid    Full                                                             +---------+---------------+---------+-----------+----------+--------------+  FV Distal Full                                                             +---------+---------------+---------+-----------+----------+--------------+  PFV       Full                                                             +---------+---------------+---------+-----------+----------+--------------+  POP       Full            Yes       Yes                                    +---------+---------------+---------+-----------+----------+--------------+  PTV       Full                                                             +---------+---------------+---------+-----------+----------+--------------+  PERO      Full                                                             +---------+---------------+---------+-----------+----------+--------------+     Summary: Right: There is no evidence of deep vein thrombosis in the lower extremity. No cystic structure found in the popliteal fossa. Left: There is no evidence of deep vein thrombosis in the lower extremity. No cystic structure found in the popliteal fossa.  *See table(s) above for measurements and observations. Electronically signed by Deitra Mayo MD on 09/14/2018 at 1:24:07 PM.    Final     Assessment/Plan Hypotension Will decrease her Cardizem to 120 mg  Patient agreed Continue to monitor her vitals  LE edema D/W patient about starting Lasix  She says it makes her dizzy Will try Ted hoses and elevating her Legs for now Other Issues Atrial fibrillation, unspecified type ([place]) On Xarelto Rate Control on Cardizem  Parkinson's disease ([place]) On Sinemet Working with therapy. Very weak in Proximal muscle and slow gait Her Fall seems related to Parkinson. D/W the daughter she will need Higher level of care most  likely. But reval in 2 weeks  Non-traumatic rhabdomyolysis Thought to be due to Falls Crestor was discontinued for now Daughter wants to Talk to Cardiologist before restarting it Acute lower UTI Was treated with Cipro and then Keflex in the hospital  D/W the daughter if her symptoms persists will repeat UA and possible referral to Urology  Unstable gait with Falls Working with therapy Labs/tests ordered:   Total time spent in this patient care encounter was  25_  minutes; greater than 50% of the visit spent counseling patient and staff, reviewing records , Labs and coordinating care for problems addressed at this encounter.

## 2018-09-18 DIAGNOSIS — I959 Hypotension, unspecified: Secondary | ICD-10-CM

## 2018-09-18 HISTORY — DX: Hypotension, unspecified: I95.9

## 2018-09-20 ENCOUNTER — Non-Acute Institutional Stay (SKILLED_NURSING_FACILITY): Payer: Medicare Other | Admitting: Nurse Practitioner

## 2018-09-20 ENCOUNTER — Encounter: Payer: Self-pay | Admitting: Nurse Practitioner

## 2018-09-20 DIAGNOSIS — R300 Dysuria: Secondary | ICD-10-CM

## 2018-09-20 DIAGNOSIS — I4891 Unspecified atrial fibrillation: Secondary | ICD-10-CM | POA: Diagnosis not present

## 2018-09-20 HISTORY — DX: Dysuria: R30.0

## 2018-09-20 NOTE — Assessment & Plan Note (Signed)
heart rate is controlled, continue  Diltiazem 120mg  qd, Xarelto 20mg  qd for thromboembolic risk reduction.

## 2018-09-20 NOTE — Progress Notes (Signed)
Location:  [facility] Room Number: [id number] Place of Service:  SNF (31) Provider:  Marlana Latus  NP  [name], MD  Patient Care Team: [name], MD as PCP - General (Internal Medicine) [name], [name], MD as PCP - Cardiology (Cardiology) [name], [name], DO as Consulting Physician (Neurology) Jace Dowe X, NP as Nurse Practitioner (Internal Medicine)  Extended Emergency Contact Information Primary Emergency Contact: [facility] Address: [address]          [place], [place] [address] [name] of [place] Phone: [phone] Mobile Phone: [phone] Relation: Son Secondary Emergency Contact: Kashika, Kross Mobile Phone: [phone] Relation: Daughter Interpreter needed? No  Code Status:  Full Code Goals of care: Advanced Directive information Advanced Directives 09/14/2018  Does Patient Have a Medical Advance Directive? -  Type of Advance Directive -  Copy of [place] in Chart? -  Would patient like information on creating a medical advance directive? No - Patient declined     Chief Complaint  Patient presents with   Acute Visit    C/o - frequent urination    HPI:  Pt is a 78 y.o. female seen today for an acute visit for 09/20/18 c/o got up several times last night to go urinate, burning on urination, lower abd /back discomfort, but urinary frequency, leakage are not new. She is afebrile. Hx of Afib, heart rate is controlled on Diltiazem 120mg  qd, Xarelto 20mg  qd for thromboembolic risk reduction.     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   calcium carbonate (TUMS EX) 750 MG chewable tablet Chew 1 tablet by mouth daily.   carbidopa-levodopa (SINEMET IR) 25-100 MG tablet Take 1.5 tablets by mouth 4 (four) times daily.   Cholecalciferol (VITAMIN D3) 10 MCG (400 UNIT) CAPS Take 1 capsule by mouth daily.   diltiazem (CARDIZEM CD) 120 MG 24 hr capsule Take 120 mg by mouth daily. HOLD IF SBP < 100   ferrous sulfate 325 (65 FE) MG tablet Take 325 mg by mouth. Once A Day on Mon, Fri   phenazopyridine (PYRIDIUM) 100 MG tablet Take 100 mg by mouth 3 (three) times daily with meals.   rivaroxaban (XARELTO) 20 MG TABS tablet Take 1 tablet (20 mg total) by mouth daily. (Patient taking differently: Take 20 mg by mouth at bedtime. )   [DISCONTINUED] diltiazem (CARDIZEM CD) 180 MG 24 hr capsule Take 1 capsule (180 mg total) by mouth daily.   No facility-administered encounter medications on file as of 09/20/2018.  Review of Systems  Constitutional: Negative for activity change, appetite change, chills, diaphoresis, fatigue and fever.  HENT: Positive for hearing loss. Negative for congestion and voice change.   Eyes: Negative for visual disturbance.  Respiratory: Negative for cough, shortness of breath and wheezing.   Cardiovascular: Positive for leg swelling. Negative for chest pain and palpitations.  Gastrointestinal: Positive for abdominal pain. Negative for abdominal distention, constipation, diarrhea, nausea and vomiting.       Lower abd  discomfort.   Genitourinary: Positive for dysuria, frequency and urgency. Negative for difficulty urinating and hematuria.  Musculoskeletal: Positive for gait problem.  Skin: Negative for color change and pallor.  Neurological: Negative for dizziness, speech difficulty and headaches.  Psychiatric/Behavioral: Negative for agitation, behavioral problems, hallucinations and sleep disturbance. The patient is not nervous/anxious.     Immunization History  Administered Date(s) Administered   Influenza Split 10/20/2012   Influenza Whole 10/21/2006   Influenza, High Dose Seasonal PF 10/17/2016   Influenza-Unspecified 10/20/2013, 10/22/2017   Pneumococcal Conjugate-13 01/31/2013   Pneumococcal Polysaccharide-23 01/20/2005, 11/24/2011   Td 01/21/2004   Tdap 03/16/2014   Zoster 04/17/2009   Pertinent  Health Maintenance Due  Topic Date Due   INFLUENZA VACCINE  08/21/2018   COLONOSCOPY  06/12/2019   DEXA SCAN  Completed   PNA vac Low Risk Adult  Completed   Fall Risk  05/03/2018 11/03/2017 08/04/2017 03/06/2017 11/26/2016  Falls in the past year? 1 No No Yes Yes  Number falls in past yr: 0 - - 2 or more 2 or more  Injury with Fall? 0 - - No No  Risk Factor Category  - - - High Fall Risk High Fall Risk  Risk for fall due to : Other (Comment) - - - -  Follow up Falls evaluation completed;Education provided;Falls prevention discussed - - Falls evaluation completed Falls evaluation completed   Functional Status Survey:    Vitals:   09/20/18 1537  BP: (!) 90/58  Pulse: 83  Resp: (!) 22  Temp: 97.7 F (36.5 C)  SpO2: 97%  Weight: 140 lb (63.5 kg)   Body mass index is 21.29 kg/m. Physical Exam Vitals signs and nursing note reviewed.  Constitutional:      General: She is not in acute distress.    Appearance: Normal appearance. She is not ill-appearing, toxic-appearing or diaphoretic.  HENT:     Head: Normocephalic and atraumatic.     Nose: Nose normal.     Mouth/Throat:      Mouth: Mucous membranes are moist.  Eyes:     Extraocular Movements: Extraocular movements intact.     Conjunctiva/sclera: Conjunctivae normal.     Pupils: Pupils are equal, round, and reactive to light.  Neck:     Musculoskeletal: Normal range of motion and neck supple.  Cardiovascular:     Rate and Rhythm: Normal rate. Rhythm irregular.     Heart sounds: No murmur.  Pulmonary:     Breath sounds: No wheezing, rhonchi or rales.  Chest:     Chest wall: No tenderness.  Abdominal:     Palpations: Abdomen is soft.     Tenderness: There is no abdominal tenderness. There is no right CVA tenderness, left CVA tenderness, guarding or rebound.  Musculoskeletal:     Right lower leg: Edema present.     Left lower leg: Edema present.     Comments: Trace edema BLE. Ambulates with Amelita.   Skin:    General: Skin is warm and dry.  Neurological:  General: No focal deficit present.     Mental Status: She is alert and oriented to person, place, and time. Mental status is at baseline.     Cranial Nerves: No cranial nerve deficit.     Motor: No weakness.     Coordination: Coordination abnormal.     Gait: Gait abnormal.     Comments: Slow getting up, walking, or sitting down.   Psychiatric:        Mood and Affect: Mood normal.        Behavior: Behavior normal.        Thought Content: Thought content normal.        Judgment: Judgment normal.     Labs reviewed: Recent Labs    09/12/18 0304 09/13/18 0201 09/14/18 0043  NA 140 140 140  K 3.6 3.8 3.9  CL 108 112* 111  CO2 21* 22 23  GLUCOSE 89 95 109*  BUN 27* 24* 15  CREATININE 0.88 0.75 0.68  CALCIUM 9.2 8.4* 8.5*  MG  --  2.0  --    Recent Labs    09/12/18 0304 09/13/18 0201  AST 60* 50*  ALT 7 7  ALKPHOS 45 36*  BILITOT 1.1 0.6  PROT 6.2* 4.9*  ALBUMIN 3.7 2.8*   Recent Labs    09/11/18 2105 09/12/18 0304 09/13/18 0201  WBC 10.6* 8.0 8.9  NEUTROABS 9.4*  --  7.1  HGB 12.6 11.5* 10.5*  HCT 40.1 35.2* 32.4*    MCV 96.4 94.4 93.9  PLT 170 150 125*   Lab Results  Component Value Date   TSH 0.408 09/11/2018   No results found for: HGBA1C Lab Results  Component Value Date   CHOL 199 08/29/2015   HDL 88.20 08/29/2015   LDLCALC 103 (H) 08/29/2015   LDLDIRECT 104.2 01/25/2013   TRIG 42.0 08/29/2015   CHOLHDL 2 08/29/2015    Significant Diagnostic Results in last 30 days:  Ct Head Wo Contrast  Result Date: 09/11/2018 CLINICAL DATA:  Head trauma, on anticoagulation EXAM: CT HEAD WITHOUT CONTRAST TECHNIQUE: Contiguous axial images were obtained from the base of the skull through the vertex without intravenous contrast. COMPARISON:  04/29/2015 FINDINGS: Brain: No acute intracranial abnormality. Specifically, no hemorrhage, hydrocephalus, mass lesion, acute infarction, or significant intracranial injury. Vascular: No hyperdense vessel or unexpected calcification. Skull: No acute calvarial abnormality. Sinuses/Orbits: Visualized paranasal sinuses and mastoids clear. Orbital soft tissues unremarkable. Other: None IMPRESSION: No acute intracranial abnormality. Electronically Signed   By: Rolm Baptise M.D.   On: 09/11/2018 23:53   Ct Angio Chest Pe W Or Wo Contrast  Result Date: 09/13/2018 CLINICAL DATA:  PE suspected EXAM: CT ANGIOGRAPHY CHEST WITH CONTRAST TECHNIQUE: Multidetector CT imaging of the chest was performed using the standard protocol during bolus administration of intravenous contrast. Multiplanar CT image reconstructions and MIPs were obtained to evaluate the vascular anatomy. CONTRAST:  77mL OMNIPAQUE IOHEXOL 300 MG/ML  SOLN COMPARISON:  Chest radiograph, 09/12/2018, CT chest, 09/24/2016 FINDINGS: Cardiovascular: Satisfactory opacification of the pulmonary arteries to the segmental level. No evidence of pulmonary embolism. Cardiomegaly. Scattered left coronary artery calcifications. No pericardial effusion. Aortic atherosclerosis. Mediastinum/Nodes: No enlarged mediastinal, hilar, or axillary  lymph nodes. There is an exophytic 3.1 cm nodule posteriorly from the right lobe of the thyroid, unchanged from prior examination. Trachea, and esophagus demonstrate no significant findings. Lungs/Pleura: Small bilateral pleural effusions and associated atelectasis or consolidation Upper Abdomen: No acute abnormality. Musculoskeletal: No chest wall abnormality. No acute or significant osseous findings. Review  of the MIP images confirms the above findings. IMPRESSION: 1.  Negative examination for pulmonary embolism. 2. Small bilateral pleural effusions and associated atelectasis or consolidation. 3. Cardiomegaly, coronary artery disease and aortic atherosclerosis. 4. Unchanged 3.1 cm exophytic nodule of the right lobe of the thyroid. Electronically Signed   By: Eddie Candle M.D.   On: 09/13/2018 12:00   Mr Cervical Spine Wo Contrast  Result Date: 09/12/2018 CLINICAL DATA:  Neck pain, initial exam Radiculopathy neck pain, weakness in legs, inability to walk. Intermittent neck pain. Personal history of Parkinson's. EXAM: MRI CERVICAL SPINE WITHOUT CONTRAST TECHNIQUE: Multiplanar, multisequence MR imaging of the cervical spine was performed. No intravenous contrast was administered. COMPARISON:  CT of the cervical spine 06/17/2010 FINDINGS: Alignment: Exaggerated cervical lordosis is present. There is slight anterolisthesis at C4-5 and C7-T1. No other significant listhesis is present in the cervical spine. Vertebrae: Mild endplate marrow changes are present at C5-6. Marrow signal and vertebral body heights are otherwise normal. Skull base is normal. Cord: Normal signal is present in the cervical and upper thoracic spinal cord to the lowest imaged level, T3-4. Posterior Fossa, vertebral arteries, paraspinal tissues: Craniocervical junction is normal. Flow is present in the vertebral arteries bilaterally. Visualized intracranial contents are normal. Disc levels: C2-3: Negative. C3-4: Asymmetric right-sided facet  hypertrophy is present. There is no significant stenosis. C4-5: There is mild facet hypertrophy bilaterally without significant stenosis. C5-6: A broad-based disc osteophyte complex is present. Bilateral facet hypertrophy is noted. Central canal is patent. Moderate foraminal narrowing is worse right than left due to uncovertebral disease. C6-7: Minimal left-sided uncovertebral spurring is present without significant stenosis. C7-T1: Negative. IMPRESSION: 1. No acute or focal lesion to explain inability to walk weakness in legs. 2. Chronic loss of disc height and endplate degenerative change at C5-6 with moderate foraminal narrowing, right greater than left, secondary to uncovertebral and facet disease. 3. No significant central canal stenosis. 4. No other significant foraminal narrowing in the cervical spine. Electronically Signed   By: San Morelle M.D.   On: 09/12/2018 07:32   Mr Lumbar Spine Wo Contrast  Result Date: 09/12/2018 CLINICAL DATA:  Back pain, > 6wks conservative tx, persistent sx back pain, weakness of the legs, inability to walk. EXAM: MRI LUMBAR SPINE WITHOUT CONTRAST TECHNIQUE: Multiplanar, multisequence MR imaging of the lumbar spine was performed. No intravenous contrast was administered. COMPARISON:  None. FINDINGS: Segmentation: 5 non rib-bearing lumbar type vertebral bodies are present. The lowest fully formed vertebral body is L5. Alignment: Normal lumbar lordosis is present. There is no significant listhesis or scoliosis. Vertebrae:  Marrow signal and vertebral body heights are normal. Conus medullaris and cauda equina: Conus extends to the T12 level. Conus and cauda equina appear normal. Paraspinal and other soft tissues: A 9 mm simple cyst is present in the medial aspect of the right kidney. A 3.5 cm right adnexal cyst is present. The urinary bladder is mildly distended. Disc levels: T12-L1: Negative. L1-2: Negative. L2-3: Mild facet hypertrophy is present bilaterally. There is  some disc bulging. No significant stenosis is present. L3-4: Moderate facet hypertrophy is present. There is mild disc bulging without significant stenosis. L4-5: Mild facet hypertrophy is present bilaterally. There is no focal disc protrusion or stenosis. L5-S1: Mild facet hypertrophy is present bilaterally. There is no focal disc protrusion or stenosis. IMPRESSION: 1. Mild moderate facet hypertrophy in the lumbar spine without significant disc disease or focal stenosis to explain the patient's inability to walk or lower extremity weakness. 2. 3.5  cm right adnexal cyst. This lesion. Benign. Recommend follow-up ultrasound at 6-12 months for further evaluation. This recommendation follows ACR consensus guidelines: White Paper of the ACR Incidental Findings Committee II on Adnexal Findings. J Am Coll Radiol [phone]. Electronically Signed   By: San Morelle M.D.   On: 09/12/2018 08:00   US Pelvis Transvaginal Non-ob (tv Only)  Result Date: 08/31/2018 Ultrasound transvaginal shows uterus normal size and echotexture.  Endometrial echo 1.8 mm.  Right ovary with adjacent thin-walled echo-free avascular cyst 32 x 25 x 33 mm.  Left ovary not visualized transabdominally or transvaginally.  Cul-de-sac negative  Dg Chest Port 1 View  Result Date: 09/12/2018 CLINICAL DATA:  Weakness. EXAM: PORTABLE CHEST 1 VIEW COMPARISON:  09/17/2016 FINDINGS: Stable cardiomegaly. Unchanged mediastinal contours with aortic atherosclerosis. Biapical pleuroparenchymal scarring. No acute airspace disease, pulmonary edema, pleural fluid or pneumothorax. EKG lead overlies the chest. No acute osseous abnormalities are seen. IMPRESSION: Stable cardiomegaly without acute abnormality. Aortic Atherosclerosis (ICD10-I70.0). Electronically Signed   By: Keith Rake M.D.   On: 09/12/2018 03:24   Vas [place] Lower Extremity Venous (dvt)  Result Date: 09/14/2018  Lower Venous Study Indications: Elevated Ddimer.  Risk Factors: None  identified. Comparison Study: No prior studies. Performing Technologist: Oliver Hum RVT  Examination Guidelines: A complete evaluation includes B-mode imaging, spectral Doppler, color Doppler, and power Doppler as needed of all accessible portions of each vessel. Bilateral testing is considered an integral part of a complete examination. Limited examinations for reoccurring indications may be performed as noted.  +---------+---------------+---------+-----------+----------+--------------+  RIGHT     Compressibility Phasicity Spontaneity Properties Thrombus Aging  +---------+---------------+---------+-----------+----------+--------------+  CFV       Full            Yes       Yes                                    +---------+---------------+---------+-----------+----------+--------------+  SFJ       Full                                                             +---------+---------------+---------+-----------+----------+--------------+  FV Prox   Full                                                             +---------+---------------+---------+-----------+----------+--------------+  FV Mid    Full                                                             +---------+---------------+---------+-----------+----------+--------------+  FV Distal Full                                                             +---------+---------------+---------+-----------+----------+--------------+  PFV       Full                                                             +---------+---------------+---------+-----------+----------+--------------+  POP       Full            Yes       Yes                                    +---------+---------------+---------+-----------+----------+--------------+  PTV       Full                                                             +---------+---------------+---------+-----------+----------+--------------+  PERO      Full                                                              +---------+---------------+---------+-----------+----------+--------------+   +---------+---------------+---------+-----------+----------+--------------+  LEFT      Compressibility Phasicity Spontaneity Properties Thrombus Aging  +---------+---------------+---------+-----------+----------+--------------+  CFV       Full            Yes       Yes                                    +---------+---------------+---------+-----------+----------+--------------+  SFJ       Full                                                             +---------+---------------+---------+-----------+----------+--------------+  FV Prox   Full                                                             +---------+---------------+---------+-----------+----------+--------------+  FV Mid    Full                                                             +---------+---------------+---------+-----------+----------+--------------+  FV Distal Full                                                             +---------+---------------+---------+-----------+----------+--------------+  PFV       Full                                                             +---------+---------------+---------+-----------+----------+--------------+  POP       Full            Yes       Yes                                    +---------+---------------+---------+-----------+----------+--------------+  PTV       Full                                                             +---------+---------------+---------+-----------+----------+--------------+  PERO      Full                                                             +---------+---------------+---------+-----------+----------+--------------+     Summary: Right: There is no evidence of deep vein thrombosis in the lower extremity. No cystic structure found in the popliteal fossa. Left: There is no evidence of deep vein thrombosis in the lower extremity. No cystic structure found in the popliteal fossa.  *See  table(s) above for measurements and observations. Electronically signed by Deitra Mayo MD on 09/14/2018 at 1:24:07 PM.    Final     Assessment/Plan Dysuria 09/20/18 c/o got up several times last night to go urinate, burning on urination, lower abd /back discomfort, but urinary frequency, leakage are not new. UA C/S, Pyridium 100mg  tid x 2 days.    Atrial fibrillation (HCC) heart rate is controlled, continue  Diltiazem 120mg  qd, Xarelto 20mg  qd for thromboembolic risk reduction.      Family/ staff Communication: plan of care reviewed with the patient and charge nurse.   Labs/tests ordered:  UA C/S  Time spend 25 minutes.

## 2018-09-20 NOTE — Assessment & Plan Note (Signed)
09/20/18 c/o got up several times last night to go urinate, burning on urination, lower abd /back discomfort, but urinary frequency, leakage are not new. UA C/S, Pyridium 100mg  tid x 2 days.

## 2018-09-21 LAB — BASIC METABOLIC PANEL
BUN: 19 (ref 4–21)
Creatinine: 0.8 (ref 0.5–1.1)
Glucose: 84
Potassium: 4.1 (ref 3.4–5.3)
Sodium: 142 (ref 137–147)

## 2018-09-21 LAB — HEPATIC FUNCTION PANEL
ALT: 20 (ref 7–35)
AST: 20 (ref 13–35)
Alkaline Phosphatase: 42 (ref 25–125)
Bilirubin, Total: 0.5

## 2018-09-21 LAB — CBC AND DIFFERENTIAL
HCT: 37 (ref 36–46)
Hemoglobin: 11.8 — AB (ref 12.0–16.0)
Platelets: 210 (ref 150–399)
WBC: 6.1

## 2018-09-23 NOTE — Progress Notes (Signed)
Virtual Visit via Video Note The purpose of this virtual visit is to provide medical care while limiting exposure to the novel coronavirus.    Consent was obtained for video visit:  Yes.   Answered questions that patient had about telehealth interaction:  Yes.   I discussed the limitations, risks, security and privacy concerns of performing an evaluation and management service by telemedicine. I also discussed with the patient that there may be a patient responsible charge related to this service. The patient expressed understanding and agreed to proceed.  Pt location: Home (nursing facility) Physician Location: home Name of referring provider:  [name], [name], MD I connected with Stephanie Shaw at patients initiation/request on 09/24/2018 at  1:00 PM EDT by video enabled telemedicine application and verified that I am speaking with the correct person using two identifiers. Pt MRN:  [id number] Pt DOB:  10-Jul-1940 Video Participants:  Stephanie Shaw;     History of Present Illness:  Patient is seen today in follow-up for PD.  The records that were made available to me were reviewed.  Much has happened since our last visit.  At our last visit, I increased her carbidopa/levodopa 25/100, so that she was taking 1.5 tablets 4 times per day.  Her daughter had emailed me stating that her cardiologist had started her on Crestor, asking if I had any objection to that, which I did not.  Patient went to the hospital on September 11, 2018 after multiple falls, generalized weakness, poor fluid intake and potentially not taking her medication properly.  She did have some rhabdomyolysis, presumably from the fall, but her Crestor was discontinued because of that.  She was treated for urinary tract infection.  She went to subacute nursing rehab following her inpatient hospitalization stay, but upon discharge, she was only given carbidopa/levodopa 25/100, 1 tablet 4 times per day.  We did correct the dosing with  nursing facility after her daughter called [place].  she is getting this at 8am/11am/2pm/5pm. Since discharge, the patient has continued to have some issues with dysuria and has followed with the physician as well as nurse practitioner at friend's home [place].  Pt reports that she is still weak.  She is walking with the [place].  She is working with dressing herself (still requiring some assist with the pants).  She is getting assist with the showers. No hallucinations.  Biggest c/o is bladder incontinence esp at night.  Had some prior to this but wasn't as bad prior to hospitalization.  Allergies  Allergen Reactions  . Doxycycline Nausea And Vomiting  . Sulfonamide Derivatives     REACTION: HIVES   Current Outpatient Medications on File Prior to Visit  Medication Sig Dispense Refill  . Acetaminophen 500 MG coapsule Take 2 capsules by mouth every 8 (eight) hours as needed for fever.    . calcium carbonate (TUMS EX) 750 MG chewable tablet Chew 1 tablet by mouth daily.    . carbidopa-levodopa (SINEMET IR) 25-100 MG tablet Take 1.5 tablets by mouth 4 (four) times daily.    . Cholecalciferol (VITAMIN D3) 10 MCG (400 UNIT) CAPS Take 1 capsule by mouth daily.    Marland Kitchen diltiazem (CARDIZEM CD) 120 MG 24 hr capsule Take 120 mg by mouth daily. HOLD IF SBP < 100    . ferrous sulfate 325 (65 FE) MG tablet Take 325 mg by mouth. Once A Day on Mon, Fri    . rivaroxaban (XARELTO) 20 MG TABS tablet Take 1 tablet (20  mg total) by mouth daily. (Patient taking differently: Take 20 mg by mouth at bedtime. ) 90 tablet 1   No current facility-administered medications on file prior to visit.    Past Medical History:  Diagnosis Date  . Colles' fracture of right radius 03/05/2015  . DEGENERATIVE JOINT DISEASE, RIGHT HIP 02/16/2007  . Dupuytren's contracture   . Osteoarthritis of hip    right  . Osteoarthritis of left knee   . Parkinson disease ([place])   . Paroxysmal A-fib ([place])   . POSTMENOPAUSAL SYNDROME 02/16/2007  .  PREMATURE ATRIAL CONTRACTIONS 02/16/2007  . S/P breast biopsy, left    two o'clock position - benign  . 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 Venous insufficiency    ROS   Observations/Objective:   Vitals:   09/24/18 0803  Weight: 134 lb (60.8 kg)  Height: 5\' 8"  (1.727 m)   GEN:  The patient appears stated age and is in NAD.  Neurological examination:  Orientation: The patient is alert and oriented x3 (able to provide her detailed hx without any trouble and was in the room alone during this portion of the visit) Cranial nerves: There is good facial symmetry. There is very mild facial hypomimia.  The speech is fluent and clear. Soft palate rises symmetrically and there is no tongue deviation. Hearing is intact to conversational tone. Motor: Strength is at least antigravity x 4.   Shoulder shrug is equal and symmetric.  There is no pronator drift.  Movement examination: Tone: unable Abnormal movements: none noted Coordination:  There is no decremation, with any form of RAMS, including alternating supination and pronation of the forearm, hand opening and closing, finger taps,  toe taps bilaterally Gait and Station: I asked the patient to call a nursing aid so that someone was with her for ambulation.  The patient pushes off of the chair to arise.  She is using the Trenell.  She is stooped.  She uses the Kaysie well but has trouble in the turns (slow and turns en bloc).    Assessment and Plan:    1.  Parkinsons disease             -DaTscan was positive in September, 2018              -continue carbidopa/levodopa 25/100, 1.5 tablets qid.  Told the patient that I really did not want to change her medication right now.  She is actually much better than she was when she went to the hospital, and I did not change her medications then.  In addition, at hospital discharge, she was mistakenly only given 1 tablet 4 times per day and we just recently corrected this with the subacute nursing  facility.  -Patient is having some cramping in her feet and legs at night, but she thinks that putting pillows under her legs really helps.  I told her if that stops helping, to let me know and we could add an extended release carbidopa/levodopa at bedtime.  -suspect that she ended up in the hospital less because of PD and more because of the UTI that led to weakness and ultimately made symptoms of parkinsons looks worse.  She looks pretty good today.  The biggest help for the patient is really going to be in continued physical therapy.  -Discussed with the patient that her daughter is not on her DPR.  Her daughter has emailed multiple times, and generally we will call the patient back with the answer.  Patient  very much wants her daughter involved with the care, and states that "it must have been a mistake."  I told her that next time she is in my office or in her primary care office, to go ahead and sign a DPR for her daughter to get information.              2.  Mild anxiety and depression             -she denies any depression but still has anxiety but feels that is better control.  3.  Urinary incontinence  -Patient is very much frustrated with this, especially given the fact that I told her she needs to increase her water intake.  She is going to follow-up with her primary care physician.  If she does not get help there, she and I discussed perhaps referring to urology.  Many times, the primary care physicians can take care of this.    Follow Up Instructions:  4-6 month f/u.  I did asked the patient to email me or call me in the next month and let me know if she ended up going back to independent living, or if she ended up with higher level of care (which is suggested in her notes).  She stated that she would do that.   -I discussed the assessment and treatment plan with the patient. The patient was provided an opportunity to ask questions and all were answered. The patient agreed with the  plan and demonstrated an understanding of the instructions.   The patient was advised to call back or seek an in-person evaluation if the symptoms worsen or if the condition fails to improve as anticipated.  Much greater than 50% of this visit was spent in counseling and coordinating care.  Total time:  25 min     , DO

## 2018-09-24 ENCOUNTER — Telehealth (INDEPENDENT_AMBULATORY_CARE_PROVIDER_SITE_OTHER): Payer: Medicare Other | Admitting: Neurology

## 2018-09-24 ENCOUNTER — Other Ambulatory Visit: Payer: Self-pay

## 2018-09-24 VITALS — Ht 68.0 in | Wt 134.0 lb

## 2018-09-24 DIAGNOSIS — G20 Parkinson's disease: Secondary | ICD-10-CM

## 2018-09-24 DIAGNOSIS — R32 Unspecified urinary incontinence: Secondary | ICD-10-CM

## 2018-09-28 ENCOUNTER — Encounter: Payer: Self-pay | Admitting: Family Medicine

## 2018-09-29 ENCOUNTER — Other Ambulatory Visit: Payer: Self-pay | Admitting: Cardiovascular Disease

## 2018-09-29 NOTE — Telephone Encounter (Signed)
Pt last saw Dr Acie Fredrickson 05/03/18 telemedicine visit Covid-19, last labs 09/14/18 Creat 0.68, age 78, weight 60.8kg, CrCl 65.44, based on CrCl pt is on appropriate dosage of Xarelto 20mg  QD.  Will refill rx.

## 2018-10-01 ENCOUNTER — Ambulatory Visit (INDEPENDENT_AMBULATORY_CARE_PROVIDER_SITE_OTHER): Payer: Medicare Other | Admitting: Cardiology

## 2018-10-01 ENCOUNTER — Other Ambulatory Visit: Payer: Self-pay

## 2018-10-01 ENCOUNTER — Encounter: Payer: Self-pay | Admitting: Cardiology

## 2018-10-01 VITALS — BP 112/50 | HR 90 | Ht 68.0 in | Wt 135.8 lb

## 2018-10-01 DIAGNOSIS — I482 Chronic atrial fibrillation, unspecified: Secondary | ICD-10-CM | POA: Diagnosis not present

## 2018-10-01 DIAGNOSIS — R600 Localized edema: Secondary | ICD-10-CM

## 2018-10-01 DIAGNOSIS — R531 Weakness: Secondary | ICD-10-CM

## 2018-10-01 NOTE — Progress Notes (Signed)
Cardiology Office Note:    Date:  10/01/2018   ID:  Stephanie Shaw, DOB 1940/04/30, MRN [id number]  PCP:  [name], MD  Cardiologist:  Mertie Moores, MD  Referring MD: [name], [name], MD   Chief Complaint  Patient presents with   Hospitalization Follow-up    Weakness/fall   Atrial Fibrillation    History of Present Illness:    Stephanie Shaw is a 78 y.o. female with a past medical history significant for Addison's disease, osteoarthritis, chronic atrial fibrillation on Xarelto.  She was living independently at friend's home.  She has had leg swelling for quite some time.  She was tried on Lasix which did not help and caused her dizziness so this was discontinued.  She was recently hospitalized on 09/10/2020 09/14/2018 with a fall and generalized weakness.  Her CK level was mildly elevated and she was treated for possible rhabdomyolysis.  D-dimer was elevated but CT angiogram ruled out PE.  She was seen by PT/OT and ended up discharging to SNF for rehab.  Her Crestor was discontinued due to rhabdomyolysis.  The patient comes in today for hospital follow-up and appears frail, using a rolling [place]. Is at rehab for PT. Hopes to go to assisted living next week then back to her indep living apt.   Still feeling weak, making slow progress. Incontinence is getting a little better. No more falls so far.  She denies any chest pain or shortness of breath.  She does still have lower leg edema.  She has a IT sales professional but she says it does not help.  She has been putting pillows underneath her knees at night.  I advised her to move the pillow down closer to her ankle.  She is not having any orthopnea.  Regarding her incident she says that she fell Friday morning getting out of bed, too close to the edge of the bed. Saturday, again fell when getting out of the bed, reached for her Abigale and it tipped over, causing her to fall.  When on the floor she struggled to get up for about 30 minutes.  Later that afternoon she was very weak and went to the ED. She did not fall due to dizziness.   Past Medical History:  Diagnosis Date   Colles' fracture of right radius 03/05/2015   DEGENERATIVE JOINT DISEASE, RIGHT HIP 02/16/2007   Dupuytren's contracture    Osteoarthritis of hip    right   Osteoarthritis of left knee    Parkinson disease ([facility])    Paroxysmal A-fib ([place])    POSTMENOPAUSAL SYNDROME 02/16/2007   PREMATURE ATRIAL CONTRACTIONS 02/16/2007   S/P breast biopsy, left    two o'clock position - benign   Toxic effect of venom(989.5) 07/27/2007   Venous insufficiency     Past Surgical History:  Procedure Laterality Date   BREAST BIOPSY Left    CATARACT EXTRACTION, BILATERAL      Current Medications: Current Meds  Medication Sig   Acetaminophen 500 MG coapsule Take 2 capsules by mouth every 8 (eight) hours as needed for fever.   calcium carbonate (TUMS EX) 750 MG chewable tablet Chew 1 tablet by mouth daily.   carbidopa-levodopa (SINEMET IR) 25-100 MG tablet Take 1.5 tablets by mouth 4 (four) times daily.   Cholecalciferol (VITAMIN D3) 10 MCG (400 UNIT) CAPS Take 1 capsule by mouth daily.   diltiazem (CARDIZEM CD) 120 MG 24 hr capsule Take 120 mg by mouth daily. HOLD IF SBP < 100  ferrous sulfate 325 (65 FE) MG tablet Take 325 mg by mouth. Once A Day on Mon, Fri   XARELTO 20 MG TABS tablet TAKE 1 TABLET BY MOUTH EVERY DAY     Allergies:   Doxycycline and Sulfonamide derivatives   Social History   Socioeconomic History   Marital status: Divorced    Spouse name: Not on file   Number of children: 2   Years of education: Not on file   Highest education level: Master's degree (e.g., MA, MS, MEng, MEd, MSW, MBA)  Occupational History   Occupation: retired    Comment: math, Engineering geologist strain: Not on file   Food insecurity    Worry: Not on file    Inability: Not on file   Transportation needs    Medical: Not on  file    Non-medical: Not on file  Tobacco Use   Smoking status: Former Smoker    Quit date: 08/27/1971    Years since quitting: 47.1   Smokeless tobacco: Never Used  Substance and Sexual Activity   Alcohol use: No    Alcohol/week: 0.0 standard drinks   Drug use: No   Sexual activity: Not Currently    Comment: 1st intercourse 78 yo-Fewer than 5 partners  Lifestyle   Physical activity    Days per week: Not on file    Minutes per session: Not on file   Stress: Not on file  Relationships   Social connections    Talks on phone: Not on file    Gets together: Not on file    Attends religious service: Not on file    Active member of club or organization: Not on file    Attends meetings of clubs or organizations: Not on file    Relationship status: Not on file  Other Topics Concern   Not on file  Social History Narrative   Not on file     Family History: The patient's family history includes Alcohol abuse in her father; Cancer in her father and mother; Diabetes in her mother; Healthy in her child and sister; Heart failure in her mother; Osteoarthritis in her sister. There is no history of Breast cancer. ROS:   Please see the history of present illness.     All other systems reviewed and are negative.  EKGs/Labs/Other Studies Reviewed:    The following studies were reviewed today:  Echocardiogram 07/02/2018  1. The left ventricle has normal systolic function, with an ejection fraction of 55-60%. The cavity size was normal. Left ventricular diastolic Doppler parameters are indeterminate. No evidence of left ventricular regional wall motion abnormalities.  2. The right ventricle has normal systolic function. The cavity was normal. There is no increase in right ventricular wall thickness.  3. Left atrial size was moderately dilated.  4. Right atrial size was moderately dilated.  5. No evidence of mitral valve stenosis. Trivial mitral regurgitation.  6. The aortic valve is  tricuspid. Mild calcification of the aortic valve. No stenosis of the aortic valve.  7. The aortic root is normal in size and structure.  8. The inferior vena cava was normal in size with <50% respiratory variability. 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 PA systolic pressure 30 mmHg.  9. The patient was in atrial fibrillation.  CT venogram 07/06/2018: IMPRESSION: No evidence of IVC thrombus, with unremarkable CT peer Ince of the IVC, iliac veins, and proximal femoral veins.   EKG:  EKG is not ordered  today.    Recent Labs: 09/11/2018: TSH 0.408 09/13/2018: ALT 7; Hemoglobin 10.5; Magnesium 2.0; Platelets 125 09/14/2018: BUN 15; Creatinine, Ser 0.68; Potassium 3.9; Sodium 140   Recent Lipid Panel    Component Value Date/Time   CHOL 199 08/29/2015 0741   TRIG 42.0 08/29/2015 0741   TRIG 35 12/25/2005 0935   HDL 88.20 08/29/2015 0741   CHOLHDL 2 08/29/2015 0741   VLDL 8.4 08/29/2015 0741   LDLCALC 103 (H) 08/29/2015 0741   LDLDIRECT 104.2 01/25/2013 0921    Physical Exam:    VS:  BP (!) 112/50    Pulse 90    Ht 5\' 8"  (1.727 m)    Wt 135 lb 12.8 oz (61.6 kg)    SpO2 96%    BMI 20.65 kg/m     Wt Readings from Last 3 Encounters:  10/01/18 135 lb 12.8 oz (61.6 kg)  09/24/18 134 lb (60.8 kg)  09/20/18 140 lb (63.5 kg)     Physical Exam  Constitutional: She is oriented to person, place, and time. No distress.  Frail-appearing elderly female, using a [place]  HENT:  Head: Normocephalic and atraumatic.  Neck: Normal range of motion. Neck supple. No JVD present.  Cardiovascular: Normal rate, normal heart sounds and intact distal pulses. An irregularly irregular rhythm present. Exam reveals no gallop and no friction rub.  No murmur heard. Pulmonary/Chest: Effort normal and breath sounds normal. No respiratory distress. She has no wheezes. She has no rales.  Musculoskeletal: Normal range of motion.        General: Edema present.     Comments: 1+ lower extremity  Neurological:  She is alert and oriented to person, place, and time.  Skin: Skin is warm and dry.  Psychiatric: She has a normal mood and affect. Her behavior is normal. Judgment and thought content normal.  Vitals reviewed.   ASSESSMENT:    1. Chronic atrial fibrillation   2. Weakness   3. Bilateral leg edema    PLAN:    In order of problems listed above:  Chronic atrial fibrillation -First noted in 2012.  Has been persistent. -Rate controlled with diltiazem. -On Xarelto for stroke risk reduction.  No unusual bleeding.  I do not see any reason to discontinue her Xarelto due to her recent fall.  Weakness -Patient was recently hospitalized with weakness and a fall.  The fall was mechanical, not due to dizziness.  She is currently at rehab and hopes to get stronger and go back to her independent living.  She denies frequent falls.  This episode was felt to be related to dehydration.  Lower extremity edema -This has been an issue for some time.  Previously tried on Lasix with no improvement and also it made her dizzy.  She has a IT sales professional but she says it does not help.  She is using a pillow under her knees.  I advised her to move the pillow down towards the ankle.  She limits her salt intake. -Continuing with conservative management   Medication Adjustments/Labs and Tests Ordered: Current medicines are reviewed at length with the patient today.  Concerns regarding medicines are outlined above. Labs and tests ordered and medication changes are outlined in the patient instructions below:  Patient Instructions  Medication Instructions:  Your physician recommends that you continue on your current medications as directed. Please refer to the Current Medication list given to you today.  If you need a refill on your cardiac medications before your next appointment, please  call your pharmacy.   Lab work: None   If you have labs (blood work) drawn today and your tests are completely normal, you  will receive your results only by:  [place] (if you have MyChart) OR  A paper copy in the mail If you have any lab test that is abnormal or we need to change your treatment, we will call you to review the results.  Testing/Procedures: None   Follow-Up: At [facility], you and your health needs are our priority.  As part of our continuing mission to provide you with exceptional heart care, we have created designated Provider Care Teams.  These Care Teams include your primary Cardiologist (physician) and Advanced Practice Providers (APPs -  Physician Assistants and Nurse Practitioners) who all work together to provide you with the care you need, when you need it. You will need a follow up appointment in:  3 months.  Please call our office 2 months in advance to schedule this appointment.  You may see Mertie Moores, MD or one of the following Advanced Practice Providers on your designated Care Team: Richardson Dopp, PA-C [place], [place]  [name], NP  Any Other Special Instructions Will Be Listed Below (If Applicable).   Edema  Edema is when you have too much fluid in your body or under your skin. Edema may make your legs, feet, and ankles swell up. Swelling is also common in looser tissues, like around your eyes. This is a common condition. It gets more common as you get older. There are many possible causes of edema. Eating too much salt (sodium) and being on your feet or sitting for a long time can cause edema in your legs, feet, and ankles. Hot weather may make edema worse. Edema is usually painless. Your skin may look swollen or shiny. Follow these instructions at home:  Keep the swollen body part raised (elevated) above the level of your heart when you are sitting or lying down.  Do not sit still or stand for a long time.  Do not wear tight clothes. Do not wear garters on your upper legs.  Exercise your legs. This can help the swelling go down.  Wear elastic  bandages or support stockings as told by your doctor.  Eat a low-salt (low-sodium) diet to reduce fluid as told by your doctor.  Depending on the cause of your swelling, you may need to limit how much fluid you drink (fluid restriction).  Take over-the-counter and prescription medicines only as told by your doctor. Contact a doctor if:  Treatment is not working.  You have heart, liver, or kidney disease and have symptoms of edema.  You have sudden and unexplained weight gain. Get help right away if:  You have shortness of breath or chest pain.  You cannot breathe when you lie down.  You have pain, redness, or warmth in the swollen areas.  You have heart, liver, or kidney disease and get edema all of a sudden.  You have a fever and your symptoms get worse all of a sudden. Summary  Edema is when you have too much fluid in your body or under your skin.  Edema may make your legs, feet, and ankles swell up. Swelling is also common in looser tissues, like around your eyes.  Raise (elevate) the swollen body part above the level of your heart when you are sitting or lying down.  Follow your doctor's instructions about diet and how much fluid you can drink (fluid  restriction). This information is not intended to replace advice given to you by your health care provider. Make sure you discuss any questions you have with your health care provider. Document Released: 06/25/2007 Document Revised: 01/09/2017 Document Reviewed: 01/25/2016 Elsevier Patient Education  2020 [place], [name], NP  10/01/2018 6:16 PM    [facility]

## 2018-10-01 NOTE — Patient Instructions (Signed)
Medication Instructions:  Your physician recommends that you continue on your current medications as directed. Please refer to the Current Medication list given to you today.  If you need a refill on your cardiac medications before your next appointment, please call your pharmacy.   Lab work: None   If you have labs (blood work) drawn today and your tests are completely normal, you will receive your results only by: Marland Kitchen MyChart Message (if you have MyChart) OR . A paper copy in the mail If you have any lab test that is abnormal or we need to change your treatment, we will call you to review the results.  Testing/Procedures: None   Follow-Up: At [facility], you and your health needs are our priority.  As part of our continuing mission to provide you with exceptional heart care, we have created designated Provider Care Teams.  These Care Teams include your primary Cardiologist (physician) and Advanced Practice Providers (APPs -  Physician Assistants and Nurse Practitioners) who all work together to provide you with the care you need, when you need it. You will need a follow up appointment in:  3 months.  Please call our office 2 months in advance to schedule this appointment.  You may see Mertie Moores, MD or one of the following Advanced Practice Providers on your designated Care Team: Richardson Dopp, PA-C [place], [place] . [name], NP  Any Other Special Instructions Will Be Listed Below (If Applicable).   Edema  Edema is when you have too much fluid in your body or under your skin. Edema may make your legs, feet, and ankles swell up. Swelling is also common in looser tissues, like around your eyes. This is a common condition. It gets more common as you get older. There are many possible causes of edema. Eating too much salt (sodium) and being on your feet or sitting for a long time can cause edema in your legs, feet, and ankles. Hot weather may make edema worse. Edema is usually  painless. Your skin may look swollen or shiny. Follow these instructions at home:  Keep the swollen body part raised (elevated) above the level of your heart when you are sitting or lying down.  Do not sit still or stand for a long time.  Do not wear tight clothes. Do not wear garters on your upper legs.  Exercise your legs. This can help the swelling go down.  Wear elastic bandages or support stockings as told by your doctor.  Eat a low-salt (low-sodium) diet to reduce fluid as told by your doctor.  Depending on the cause of your swelling, you may need to limit how much fluid you drink (fluid restriction).  Take over-the-counter and prescription medicines only as told by your doctor. Contact a doctor if:  Treatment is not working.  You have heart, liver, or kidney disease and have symptoms of edema.  You have sudden and unexplained weight gain. Get help right away if:  You have shortness of breath or chest pain.  You cannot breathe when you lie down.  You have pain, redness, or warmth in the swollen areas.  You have heart, liver, or kidney disease and get edema all of a sudden.  You have a fever and your symptoms get worse all of a sudden. Summary  Edema is when you have too much fluid in your body or under your skin.  Edema may make your legs, feet, and ankles swell up. Swelling is also common in looser  tissues, like around your eyes.  Raise (elevate) the swollen body part above the level of your heart when you are sitting or lying down.  Follow your doctor's instructions about diet and how much fluid you can drink (fluid restriction). This information is not intended to replace advice given to you by your health care provider. Make sure you discuss any questions you have with your health care provider. Document Released: 06/25/2007 Document Revised: 01/09/2017 Document Reviewed: 01/25/2016 Elsevier Patient Education  2020 [employer].

## 2018-10-04 ENCOUNTER — Telehealth: Payer: Self-pay | Admitting: Internal Medicine

## 2018-10-04 NOTE — Telephone Encounter (Signed)
[name] is no longer listed as PCP, provider at facility is currently listed as PCP. Please advise.

## 2018-10-04 NOTE — Telephone Encounter (Signed)
Patient is calling because she requesting a referral to urology from Dr. [name]. Patient states that she is at [facility]. Advised her to request the referrral from the PCP Dr. Lyndel Safe at [facility]. Please advise.

## 2018-10-04 NOTE — Telephone Encounter (Signed)
Patient calling asking for referral to urology due to excessive UTI's, please advise.

## 2018-10-05 ENCOUNTER — Encounter: Payer: Self-pay | Admitting: *Deleted

## 2018-10-05 ENCOUNTER — Non-Acute Institutional Stay (SKILLED_NURSING_FACILITY): Payer: Medicare Other | Admitting: Internal Medicine

## 2018-10-05 ENCOUNTER — Encounter: Payer: Self-pay | Admitting: Internal Medicine

## 2018-10-05 DIAGNOSIS — I4891 Unspecified atrial fibrillation: Secondary | ICD-10-CM | POA: Diagnosis not present

## 2018-10-05 DIAGNOSIS — M6282 Rhabdomyolysis: Secondary | ICD-10-CM

## 2018-10-05 DIAGNOSIS — G20 Parkinson's disease: Secondary | ICD-10-CM | POA: Diagnosis not present

## 2018-10-05 DIAGNOSIS — R600 Localized edema: Secondary | ICD-10-CM

## 2018-10-05 DIAGNOSIS — N3941 Urge incontinence: Secondary | ICD-10-CM

## 2018-10-05 LAB — CHLORIDE
Albumin: 3.9
Calcium: 9.5
Carbon Dioxide, Total: 28
Chloride: 107
EGFR (Non-African Amer.): 73
Globulin: 2.5
Total Protein: 6.4 g/dL

## 2018-10-05 NOTE — Progress Notes (Signed)
Location:  [place] Room Number: [place] of Service:  SNF [phone]) Provider: Veleta Miners  MD  [name], MD  Patient Care Team: [name], MD as PCP - General (Internal Medicine) [name], [name], MD as PCP - Cardiology (Cardiology) [name], [name], DO as Consulting Physician (Neurology) Mast, Man X, NP as Nurse Practitioner (Internal Medicine)  Extended Emergency Contact Information Primary Emergency Contact: [facility] Address: [address]          [place], [place] [address] [name] of [place] Phone: [phone] Mobile Phone: [phone] Relation: Son Secondary Emergency Contact: Christin, Hedding Mobile Phone: [phone] Relation: Daughter Interpreter needed? No  Code Status:  DNR Goals of care: Advanced Directive information Advanced Directives 10/05/2018  Does Patient Have a Medical Advance Directive? Yes  Type of Advance Directive Out of facility DNR (pink MOST or yellow form)  Does patient want to make changes to medical advance directive? No - Patient declined  Copy of [place] in Chart? -  Would patient like information on creating a medical advance directive? No - Patient declined  Pre-existing out of facility DNR order (yellow form or pink MOST form) Yellow form placed in chart (order not valid for inpatient use)     Chief Complaint  Patient presents with   Acute Visit    C/o - UTI symtoms    HPI:  Pt is a 78 y.o. female seen today for an acute visit for Urinary Symptoms  Patient has a history of Parkinson disease diagnosed 2 years ago on Sinemet and follows with Dr. Enrigue Catena A. fib on Xarelto, hyperlipidemia, H/o Ovarian Cyst, Lower extremity edema,2 D echo with N EF Biatrial enlargement She was in the hospital from 08/22-08/25 for Recurrent Fall, UTI and Rhabdomyolysis  Patient wanted to see me today as she continues to have urinary symptoms.  Her repeat urine culture  was negative with no growth.  But she states she keeps going to the bathroom every few hours.  Especially bothers her at night. She also has complain of these muscle spasms she gets especially at night.  She said that Tylenol is helping. Her plan is to transition to AL considering going to her independent apartments and to continue therapy.  Patient is worried that she is not making the progress as fast as she wants to be.  She is still needing help with her dressing.  And it takes her own a lot of time to get to the bathroom.    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   calcium carbonate (TUMS EX) 750 MG chewable tablet Chew 1 tablet by mouth daily.   carbidopa-levodopa (SINEMET IR) 25-100 MG tablet Take 1.5 tablets by mouth 4 (four) times daily.   Cholecalciferol (VITAMIN D3) 10 MCG (400 UNIT) CAPS Take 1 capsule by mouth daily.   diltiazem (CARDIZEM CD) 120 MG 24 hr capsule Take 120 mg by mouth daily. HOLD IF SBP < 100   ferrous sulfate 325 (65 FE) MG tablet Take 325 mg by mouth. Once A Day on Mon, Fri   XARELTO 20 MG TABS tablet TAKE  1 TABLET BY MOUTH EVERY DAY   No facility-administered encounter medications on file as of 10/05/2018.     Review of Systems  Constitutional: Positive for activity change.  HENT: Negative.   Cardiovascular: Positive for leg swelling.  Gastrointestinal: Negative.   Genitourinary: Positive for frequency.  Musculoskeletal: Positive for gait problem.  Skin: Negative.   Psychiatric/Behavioral: Negative.   All other systems reviewed and are negative.   Immunization History  Administered Date(s) Administered   Influenza Split 10/20/2012   Influenza Whole 10/21/2006   Influenza, High Dose Seasonal PF 10/17/2016   Influenza-Unspecified 10/20/2013, 10/22/2017   Pneumococcal Conjugate-13 01/31/2013   Pneumococcal Polysaccharide-23 01/20/2005, 11/24/2011   Td 01/21/2004   Tdap 03/16/2014   Zoster 04/17/2009   Pertinent  Health Maintenance Due  Topic Date Due   INFLUENZA VACCINE  08/21/2018   COLONOSCOPY  06/12/2019   DEXA SCAN  Completed   PNA vac Low Risk Adult  Completed   Fall Risk  05/03/2018 11/03/2017 08/04/2017 03/06/2017 11/26/2016  Falls in the past year? 1 No No Yes Yes  Number falls in past yr: 0 - - 2 or more 2 or more  Injury with Fall? 0 - - No No  Risk Factor Category  - - - High Fall Risk High Fall Risk  Risk for fall due to : Other (Comment) - - - -  Follow up Falls evaluation completed;Education provided;Falls prevention discussed - - Falls evaluation completed Falls evaluation completed   Functional Status Survey:    Vitals:   10/05/18 1052  BP: 110/68  Pulse: 90  Resp: 20  Temp: 98.2 F (36.8 C)  SpO2: 95%  Weight: 132 lb 6.4 oz (60.1 kg)  Height: 5\' 8"  (1.727 m)   Body mass index is 20.13 kg/m. Physical Exam Vitals signs reviewed.  HENT:     Head: Normocephalic.     Nose: Nose normal.     Mouth/Throat:     Mouth: Mucous membranes are moist.  Neck:     Musculoskeletal: Neck supple.  Cardiovascular:     Rate and Rhythm: Normal rate.  Rhythm irregular.     Heart sounds: Murmur present.  Pulmonary:     Effort: Pulmonary effort is normal.     Breath sounds: Normal breath sounds.  Abdominal:     General: Bowel sounds are normal.     Palpations: Abdomen is soft.  Musculoskeletal:        General: Swelling present.  Skin:    General: Skin is warm.  Neurological:     General: No focal deficit present.     Mental Status: She is alert and oriented to person, place, and time.     Comments: Better now with her standing from sitting position. Gait still slow but more steady  Psychiatric:        Mood and Affect: Mood normal.        Thought  Content: Thought content normal.        Judgment: Judgment normal.     Labs reviewed: Recent Labs    09/12/18 0304 09/13/18 0201 09/14/18 0043 09/21/18  NA 140 140 140 142  K 3.6 3.8 3.9 4.1  CL 108 112* 111 107  CO2 21* 22 23 28   GLUCOSE 89 95 109*  --   BUN 27* 24* 15 19  CREATININE 0.88 0.75 0.68 0.8  CALCIUM 9.2 8.4* 8.5* 9.5  MG  --  2.0  --   --    Recent Labs    09/12/18 0304 09/13/18 0201 09/21/18  AST 60* 50* 20  ALT 7 7 20   ALKPHOS 45 36* 42  BILITOT 1.1 0.6  --   PROT 6.2* 4.9* 6.4  ALBUMIN 3.7 2.8* 3.9   Recent Labs    09/11/18 2105 09/12/18 0304 09/13/18 0201 09/21/18  WBC 10.6* 8.0 8.9 6.1  NEUTROABS 9.4*  --  7.1  --   HGB 12.6 11.5* 10.5* 11.8*  HCT 40.1 35.2* 32.4* 37  MCV 96.4 94.4 93.9  --   PLT 170 150 125* 210   Lab Results  Component Value Date   TSH 0.408 09/11/2018   No results found for: HGBA1C Lab Results  Component Value Date   CHOL 199 08/29/2015   HDL 88.20 08/29/2015   LDLCALC 103 (H) 08/29/2015   LDLDIRECT 104.2 01/25/2013   TRIG 42.0 08/29/2015   CHOLHDL 2 08/29/2015    Significant Diagnostic Results in last 30 days:  Ct Head Wo Contrast  Result Date: 09/11/2018 CLINICAL DATA:  Head trauma, on anticoagulation EXAM: CT HEAD WITHOUT CONTRAST TECHNIQUE: Contiguous axial images were obtained from the base of the skull  through the vertex without intravenous contrast. COMPARISON:  04/29/2015 FINDINGS: Brain: No acute intracranial abnormality. Specifically, no hemorrhage, hydrocephalus, mass lesion, acute infarction, or significant intracranial injury. Vascular: No hyperdense vessel or unexpected calcification. Skull: No acute calvarial abnormality. Sinuses/Orbits: Visualized paranasal sinuses and mastoids clear. Orbital soft tissues unremarkable. Other: None IMPRESSION: No acute intracranial abnormality. Electronically Signed   By: Rolm Baptise M.D.   On: 09/11/2018 23:53   Ct Angio Chest Pe W Or Wo Contrast  Result Date: 09/13/2018 CLINICAL DATA:  PE suspected EXAM: CT ANGIOGRAPHY CHEST WITH CONTRAST TECHNIQUE: Multidetector CT imaging of the chest was performed using the standard protocol during bolus administration of intravenous contrast. Multiplanar CT image reconstructions and MIPs were obtained to evaluate the vascular anatomy. CONTRAST:  60mL OMNIPAQUE IOHEXOL 300 MG/ML  SOLN COMPARISON:  Chest radiograph, 09/12/2018, CT chest, 09/24/2016 FINDINGS: Cardiovascular: Satisfactory opacification of the pulmonary arteries to the segmental level. No evidence of pulmonary embolism. Cardiomegaly. Scattered left coronary artery calcifications. No pericardial effusion. Aortic atherosclerosis. Mediastinum/Nodes: No enlarged mediastinal, hilar, or axillary lymph nodes. There is an exophytic 3.1 cm nodule posteriorly from the right lobe of the thyroid, unchanged from prior examination. Trachea, and esophagus demonstrate no significant findings. Lungs/Pleura: Small bilateral pleural effusions and associated atelectasis or consolidation Upper Abdomen: No acute abnormality. Musculoskeletal: No chest wall abnormality. No acute or significant osseous findings. Review of the MIP images confirms the above findings. IMPRESSION: 1.  Negative examination for pulmonary embolism. 2. Small bilateral pleural effusions and associated atelectasis or  consolidation. 3. Cardiomegaly, coronary artery disease and aortic atherosclerosis. 4. Unchanged 3.1 cm exophytic nodule of the right lobe of the thyroid. Electronically Signed   By: Eddie Candle M.D.   On: 09/13/2018 12:00   Mr Cervical Spine Wo Contrast  Result Date: 09/12/2018 CLINICAL DATA:  Neck pain, initial exam Radiculopathy neck pain, weakness in legs, inability to walk. Intermittent neck pain. Personal history of Parkinson's. EXAM: MRI CERVICAL SPINE WITHOUT CONTRAST TECHNIQUE: Multiplanar, multisequence MR imaging of the cervical spine was performed. No intravenous contrast was administered. COMPARISON:  CT of the cervical spine 06/17/2010 FINDINGS: Alignment: Exaggerated cervical lordosis is present. There is slight anterolisthesis at C4-5 and C7-T1. No other significant listhesis is present in the cervical spine. Vertebrae: Mild endplate marrow changes are present at C5-6. Marrow signal and vertebral body heights are otherwise normal. Skull base is normal. Cord: Normal signal is present in the cervical and upper thoracic spinal cord to the lowest imaged level, T3-4. Posterior Fossa, vertebral arteries, paraspinal tissues: Craniocervical junction is normal. Flow is present in the vertebral arteries bilaterally. Visualized intracranial contents are normal. Disc levels: C2-3: Negative. C3-4: Asymmetric right-sided facet hypertrophy is present. There is no significant stenosis. C4-5: There is mild facet hypertrophy bilaterally without significant stenosis. C5-6: A broad-based disc osteophyte complex is present. Bilateral facet hypertrophy is noted. Central canal is patent. Moderate foraminal narrowing is worse right than left due to uncovertebral disease. C6-7: Minimal left-sided uncovertebral spurring is present without significant stenosis. C7-T1: Negative. IMPRESSION: 1. No acute or focal lesion to explain inability to walk weakness in legs. 2. Chronic loss of disc height and endplate degenerative  change at C5-6 with moderate foraminal narrowing, right greater than left, secondary to uncovertebral and facet disease. 3. No significant central canal stenosis. 4. No other significant foraminal narrowing in the cervical spine. Electronically Signed   By: San Morelle M.D.   On: 09/12/2018 07:32   Mr Lumbar Spine Wo Contrast  Result Date: 09/12/2018 CLINICAL DATA:  Back pain, > 6wks conservative tx, persistent sx back pain, weakness of the legs, inability to walk. EXAM: MRI LUMBAR SPINE WITHOUT CONTRAST TECHNIQUE: Multiplanar, multisequence MR imaging of the lumbar spine was performed. No intravenous contrast was administered. COMPARISON:  None. FINDINGS: Segmentation: 5 non rib-bearing lumbar type vertebral bodies are present. The lowest fully formed vertebral body is L5. Alignment: Normal lumbar lordosis is present. There is no significant listhesis or scoliosis. Vertebrae:  Marrow signal and vertebral body heights are normal. Conus medullaris and cauda equina: Conus extends to the T12 level. Conus and cauda equina appear normal. Paraspinal and other soft tissues: A 9 mm simple cyst is present in the medial aspect of the right kidney. A 3.5 cm right adnexal cyst is present. The urinary bladder is mildly distended. Disc levels: T12-L1: Negative. L1-2: Negative. L2-3: Mild facet hypertrophy is present bilaterally. There is some disc bulging. No significant stenosis is present. L3-4: Moderate facet hypertrophy is present. There is mild disc bulging without significant stenosis. L4-5: Mild facet hypertrophy is present bilaterally. There is no focal disc protrusion or stenosis. L5-S1: Mild facet hypertrophy is present bilaterally. There is no focal disc protrusion or stenosis. IMPRESSION: 1. Mild moderate facet hypertrophy in the lumbar spine without significant disc disease or focal stenosis to explain the patient's inability to walk or lower extremity weakness. 2. 3.5 cm right adnexal cyst. This lesion.  Benign. Recommend follow-up ultrasound at 6-12 months for further evaluation. This recommendation follows ACR consensus guidelines: White Paper of the ACR Incidental Findings Committee II on Adnexal Findings. J Am Coll Radiol [phone]. Electronically Signed   By: San Morelle M.D.   On: 09/12/2018 08:00   Dg Chest Port 1 View  Result Date: 09/12/2018 CLINICAL DATA:  Weakness. EXAM:  PORTABLE CHEST 1 VIEW COMPARISON:  09/17/2016 FINDINGS: Stable cardiomegaly. Unchanged mediastinal contours with aortic atherosclerosis. Biapical pleuroparenchymal scarring. No acute airspace disease, pulmonary edema, pleural fluid or pneumothorax. EKG lead overlies the chest. No acute osseous abnormalities are seen. IMPRESSION: Stable cardiomegaly without acute abnormality. Aortic Atherosclerosis (ICD10-I70.0). Electronically Signed   By: Keith Rake M.D.   On: 09/12/2018 03:24   Vas [place] Lower Extremity Venous (dvt)  Result Date: 09/14/2018  Lower Venous Study Indications: Elevated Ddimer.  Risk Factors: None identified. Comparison Study: No prior studies. Performing Technologist: Oliver Hum RVT  Examination Guidelines: A complete evaluation includes B-mode imaging, spectral Doppler, color Doppler, and power Doppler as needed of all accessible portions of each vessel. Bilateral testing is considered an integral part of a complete examination. Limited examinations for reoccurring indications may be performed as noted.  +---------+---------------+---------+-----------+----------+--------------+  RIGHT     Compressibility Phasicity Spontaneity Properties Thrombus Aging  +---------+---------------+---------+-----------+----------+--------------+  CFV       Full            Yes       Yes                                    +---------+---------------+---------+-----------+----------+--------------+  SFJ       Full                                                              +---------+---------------+---------+-----------+----------+--------------+  FV Prox   Full                                                             +---------+---------------+---------+-----------+----------+--------------+  FV Mid    Full                                                             +---------+---------------+---------+-----------+----------+--------------+  FV Distal Full                                                             +---------+---------------+---------+-----------+----------+--------------+  PFV       Full                                                             +---------+---------------+---------+-----------+----------+--------------+  POP       Full            Yes       Yes                                    +---------+---------------+---------+-----------+----------+--------------+  PTV       Full                                                             +---------+---------------+---------+-----------+----------+--------------+  PERO      Full                                                             +---------+---------------+---------+-----------+----------+--------------+   +---------+---------------+---------+-----------+----------+--------------+  LEFT      Compressibility Phasicity Spontaneity Properties Thrombus Aging  +---------+---------------+---------+-----------+----------+--------------+  CFV       Full            Yes       Yes                                    +---------+---------------+---------+-----------+----------+--------------+  SFJ       Full                                                             +---------+---------------+---------+-----------+----------+--------------+  FV Prox   Full                                                             +---------+---------------+---------+-----------+----------+--------------+  FV Mid    Full                                                              +---------+---------------+---------+-----------+----------+--------------+  FV Distal Full                                                             +---------+---------------+---------+-----------+----------+--------------+  PFV       Full                                                             +---------+---------------+---------+-----------+----------+--------------+  POP       Full            Yes       Yes                                    +---------+---------------+---------+-----------+----------+--------------+  PTV       Full                                                             +---------+---------------+---------+-----------+----------+--------------+  PERO      Full                                                             +---------+---------------+---------+-----------+----------+--------------+     Summary: Right: There is no evidence of deep vein thrombosis in the lower extremity. No cystic structure found in the popliteal fossa. Left: There is no evidence of deep vein thrombosis in the lower extremity. No cystic structure found in the popliteal fossa.  *See table(s) above for measurements and observations. Electronically signed by Deitra Mayo MD on 09/14/2018 at 1:24:07 PM.    Final     Assessment/Plan Urge incontinence of urine Start her on Myrbetiq Referal to Urology  Atrial fibrillation, unspecified type ([place]) Stable on Cardizem Dose refduced due to Low BP On Xarelto Parkinson's disease ([place]) Follows with Neurology On Sinemet  Bilateral lower extremity edema Does not tolerate Lasix due to Dizziness Continue Ted hoses  Non-traumatic rhabdomyolysis Patient concerned about her CK level Will recheck Nightly Muscle spasm Dr [name] thought can be due to [place] Will start her on Tylenol QHS Possible increasing dose of Sinemet at night  Discharge Planning Possible discharge to [place] by end of this week with transition to [place] later Family/ staff  Communication:   Labs/tests ordered:

## 2018-10-05 NOTE — Telephone Encounter (Signed)
Please advise. It looks like a message asking for this referral was also sent to her PCP.

## 2018-10-06 ENCOUNTER — Non-Acute Institutional Stay (SKILLED_NURSING_FACILITY): Payer: Medicare Other | Admitting: Nurse Practitioner

## 2018-10-06 ENCOUNTER — Other Ambulatory Visit: Payer: Self-pay | Admitting: Neurology

## 2018-10-06 ENCOUNTER — Encounter: Payer: Self-pay | Admitting: Nurse Practitioner

## 2018-10-06 DIAGNOSIS — G20 Parkinson's disease: Secondary | ICD-10-CM | POA: Diagnosis not present

## 2018-10-06 DIAGNOSIS — N3281 Overactive bladder: Secondary | ICD-10-CM

## 2018-10-06 DIAGNOSIS — I4891 Unspecified atrial fibrillation: Secondary | ICD-10-CM

## 2018-10-06 DIAGNOSIS — R600 Localized edema: Secondary | ICD-10-CM

## 2018-10-06 HISTORY — DX: Overactive bladder: N32.81

## 2018-10-06 NOTE — Assessment & Plan Note (Signed)
Chronic, no open wounds, no pain noted, continue to observe.

## 2018-10-06 NOTE — Telephone Encounter (Signed)
Requested Prescriptions   Pending Prescriptions Disp Refills  . carbidopa-levodopa (SINEMET IR) 25-100 MG tablet [Pharmacy Med Name: CARBIDOPA-LEVODOPA 25-100 TAB] 450 tablet 1    Sig: TAKE 1 TABLET BY MOUTH 4 TIMES A DAY **MAY TAKE EXTRA TABLET IF NEEDED   Rx last filled:  Pt last seen: 09/24/18  Assessment and Plan:    1. Parkinsons disease -DaTscan was positive in September, 2018  -continue carbidopa/levodopa 25/100, 1.5 tablets qid.  Told the patient that I really did not want to change her medication right now.  She is actually much better than she was when she went to the hospital, and I did not change her medications then.  In addition, at hospital discharge, she was mistakenly only given 1 tablet 4 times per day and we just recently corrected this with the subacute nursing facility.  Follow up appt scheduled:03/15/2019

## 2018-10-06 NOTE — Assessment & Plan Note (Signed)
Slow movement, f/u Neurology, continue Sinemet 25/100mg  1.5 tab qid.

## 2018-10-06 NOTE — Assessment & Plan Note (Signed)
Heart rate is in control, 90s at rest, continue Xarelto 20mg  qd, Diltiazem 120mg  qd.

## 2018-10-06 NOTE — Assessment & Plan Note (Signed)
Just started Myrbetriq, too soon to eval, pending Urology evaluation. Observe.

## 2018-10-06 NOTE — Progress Notes (Signed)
Location:  [facility] Room Number: [place] of Service:  SNF [phone])  Provider: Marlana Latus  NP  PCP: [name], MD Patient Care Team: [name], MD as PCP - General (Internal Medicine) [name], [name], MD as PCP - Cardiology (Cardiology) [name], [name], DO as Consulting Physician (Neurology) Verbena Boeding X, NP as Nurse Practitioner (Internal Medicine)  Extended Emergency Contact Information Primary Emergency Contact: [facility] Address: [address]          [place], [place] [address] [name] of [place] Phone: [phone] Mobile Phone: [phone] Relation: Son Secondary Emergency Contact: Nevah, Lichter Mobile Phone: [phone] Relation: Daughter Interpreter needed? No  Code Status: DNR Goals of care:  Advanced Directive information Advanced Directives 10/05/2018  Does Patient Have a Medical Advance Directive? Yes  Type of Advance Directive Out of facility DNR (pink MOST or yellow form)  Does patient want to make changes to medical advance directive? No - Patient declined  Copy of [place] in Chart? -  Would patient like information on creating a medical advance directive? No - Patient declined  Pre-existing out of facility DNR order (yellow form or pink MOST form) Yellow form placed in chart (order not valid for inpatient use)     Allergies  Allergen Reactions   Doxycycline Nausea And Vomiting   Sulfonamide Derivatives     REACTION: HIVES    Chief Complaint  Patient presents with   Discharge Note    HPI:  78 y.o. female with PMH of AFib, heart rate is in control, on Xarelto 20mg  qd, Diltiazem 120mg  qd. OAB, started Myrbetriq, pending Urology evaluation. Parkinson's disease, slow movement, on Sinemet 25/100mg  1.5 tab qid. Chronic edema BLE, not new.     Past Medical History:  Diagnosis Date   Colles' fracture of right radius 03/05/2015   DEGENERATIVE JOINT DISEASE, RIGHT HIP  02/16/2007   Dupuytren's contracture    Osteoarthritis of hip    right   Osteoarthritis of left knee    Parkinson disease ([place])    Paroxysmal A-fib ([place])    POSTMENOPAUSAL SYNDROME 02/16/2007   PREMATURE ATRIAL CONTRACTIONS 02/16/2007   S/P breast biopsy, left    two o'clock position - benign   Toxic effect of venom(989.5) 07/27/2007   Venous insufficiency     Past Surgical History:  Procedure Laterality Date   BREAST BIOPSY Left    CATARACT EXTRACTION, BILATERAL        reports that she quit smoking about 47 years ago. She has never used smokeless tobacco. She reports that she does not drink alcohol or use drugs. Social History   Socioeconomic History   Marital status: Divorced    Spouse name: Not on file   Number of children: 2   Years of education: Not on file   Highest education level: Master's degree (e.g., MA, MS, MEng, MEd, MSW, MBA)  Occupational History   Occupation: retired    Comment: math, Engineering geologist strain: Not on file   Food insecurity    Worry: Not on file    Inability: Not on Lexicographer needs    Medical: Not on file    Non-medical: Not on file  Tobacco Use   Smoking status: Former Smoker    Quit date: 08/27/1971    Years since quitting: 47.1   Smokeless tobacco: Never Used  Substance and Sexual Activity   Alcohol use: No  Alcohol/week: 0.0 standard drinks   Drug use: No   Sexual activity: Not Currently    Comment: 1st intercourse 78 yo-Fewer than 5 partners  Lifestyle   Physical activity    Days per week: Not on file    Minutes per session: Not on file   Stress: Not on file  Relationships   Social connections    Talks on phone: Not on file    Gets together: Not on file    Attends religious service: Not on file    Active member of club or organization: Not on file    Attends meetings of clubs or organizations: Not on file    Relationship status: Not on file   Intimate partner  violence    Fear of current or ex partner: Not on file    Emotionally abused: Not on file    Physically abused: Not on file    Forced sexual activity: Not on file  Other Topics Concern   Not on file  Social History Narrative   Not on file   Functional Status Survey:    Allergies  Allergen Reactions   Doxycycline Nausea And Vomiting   Sulfonamide Derivatives     REACTION: HIVES    Pertinent  Health Maintenance Due  Topic Date Due   INFLUENZA VACCINE  08/21/2018   COLONOSCOPY  06/12/2019   DEXA SCAN  Completed   PNA vac Low Risk Adult  Completed    Medications: Outpatient Encounter Medications as of 10/06/2018  Medication Sig   Acetaminophen 500 MG coapsule Take 2 capsules by mouth every 8 (eight) hours as needed. Take 2 tablets at bedtime.   calcium carbonate (TUMS EX) 750 MG chewable tablet Chew 1 tablet by mouth daily.   carbidopa-levodopa (SINEMET IR) 25-100 MG tablet Take 1.5 tablets by mouth 4 times a day   Cholecalciferol (VITAMIN D3) 10 MCG (400 UNIT) CAPS Take 1 capsule by mouth daily.   diltiazem (CARDIZEM CD) 120 MG 24 hr capsule Take 120 mg by mouth daily. HOLD IF SBP < 100   ferrous sulfate 325 (65 FE) MG tablet Take 325 mg by mouth. Once A Day on Mon, Fri   mirabegron ER (MYRBETRIQ) 25 MG TB24 tablet Take 25 mg by mouth daily.   XARELTO 20 MG TABS tablet TAKE 1 TABLET BY MOUTH EVERY DAY   No facility-administered encounter medications on file as of 10/06/2018.     Review of Systems  Constitutional: Negative for activity change, appetite change, chills, diaphoresis, fatigue and fever.  HENT: Positive for hearing loss. Negative for congestion and voice change.   Respiratory: Negative for cough, shortness of breath and wheezing.   Cardiovascular: Positive for leg swelling. Negative for chest pain and palpitations.  Gastrointestinal: Negative for abdominal distention, abdominal pain, constipation, diarrhea, nausea and vomiting.  Genitourinary:  Positive for frequency. Negative for difficulty urinating, dysuria and urgency.  Musculoskeletal: Positive for gait problem.  Skin: Negative for color change and pallor.  Neurological: Negative for dizziness, speech difficulty, weakness and headaches.       Moves slow  Psychiatric/Behavioral: Negative for agitation, behavioral problems, hallucinations and sleep disturbance. The patient is not nervous/anxious.     Vitals:   10/06/18 1051  BP: 90/60  Pulse: 96  Resp: 20  Temp: 97.8 F (36.6 C)  SpO2: 96%  Weight: 132 lb 6.4 oz (60.1 kg)  Height: 5\' 8"  (1.727 m)   Body mass index is 20.13 kg/m. Physical Exam Vitals signs and nursing note reviewed.  Constitutional:      General: She is not in acute distress.    Appearance: Normal appearance. She is not ill-appearing, toxic-appearing or diaphoretic.  HENT:     Head: Normocephalic and atraumatic.     Nose: Nose normal.     Mouth/Throat:     Mouth: Mucous membranes are moist.  Eyes:     Extraocular Movements: Extraocular movements intact.     Conjunctiva/sclera: Conjunctivae normal.  Neck:     Musculoskeletal: Normal range of motion and neck supple.  Cardiovascular:     Rate and Rhythm: Normal rate. Rhythm irregular.     Heart sounds: Murmur present.  Pulmonary:     Breath sounds: No wheezing, rhonchi or rales.  Abdominal:     General: Bowel sounds are normal.     Palpations: Abdomen is soft.     Tenderness: There is no abdominal tenderness. There is no right CVA tenderness, left CVA tenderness, guarding or rebound.     Hernia: No hernia is present.  Musculoskeletal:     Right lower leg: Edema present.     Left lower leg: Edema present.     Comments: 1-2+ edema BLE, ambulates with [place], moves slow.   Skin:    General: Skin is warm and dry.  Neurological:     General: No focal deficit present.     Mental Status: She is alert and oriented to person, place, and time. Mental status is at baseline.  Psychiatric:         Mood and Affect: Mood normal.        Behavior: Behavior normal.        Thought Content: Thought content normal.        Judgment: Judgment normal.     Labs reviewed: Basic Metabolic Panel: Recent Labs    09/12/18 0304 09/13/18 0201 09/14/18 0043 09/21/18  NA 140 140 140 142  K 3.6 3.8 3.9 4.1  CL 108 112* 111 107  CO2 21* 22 23 28   GLUCOSE 89 95 109*  --   BUN 27* 24* 15 19  CREATININE 0.88 0.75 0.68 0.8  CALCIUM 9.2 8.4* 8.5* 9.5  MG  --  2.0  --   --    Liver Function Tests: Recent Labs    09/12/18 0304 09/13/18 0201 09/21/18  AST 60* 50* 20  ALT 7 7 20   ALKPHOS 45 36* 42  BILITOT 1.1 0.6  --   PROT 6.2* 4.9* 6.4  ALBUMIN 3.7 2.8* 3.9   No results for input(s): LIPASE, AMYLASE in the last 8760 hours. No results for input(s): AMMONIA in the last 8760 hours. CBC: Recent Labs    09/11/18 2105 09/12/18 0304 09/13/18 0201 09/21/18  WBC 10.6* 8.0 8.9 6.1  NEUTROABS 9.4*  --  7.1  --   HGB 12.6 11.5* 10.5* 11.8*  HCT 40.1 35.2* 32.4* 37  MCV 96.4 94.4 93.9  --   PLT 170 150 125* 210   Cardiac Enzymes: Recent Labs    09/12/18 0304 09/13/18 0201 09/14/18 0043  CKTOTAL 1,900* 1,099* 599*   BNP: Invalid input(s): POCBNP CBG: No results for input(s): GLUCAP in the last 8760 hours.  Procedures and Imaging Studies During Stay: Ct Head Wo Contrast  Result Date: 09/11/2018 CLINICAL DATA:  Head trauma, on anticoagulation EXAM: CT HEAD WITHOUT CONTRAST TECHNIQUE: Contiguous axial images were obtained from the base of the skull through the vertex without intravenous contrast. COMPARISON:  04/29/2015 FINDINGS: Brain: No acute intracranial abnormality. Specifically, no hemorrhage, hydrocephalus,  mass lesion, acute infarction, or significant intracranial injury. Vascular: No hyperdense vessel or unexpected calcification. Skull: No acute calvarial abnormality. Sinuses/Orbits: Visualized paranasal sinuses and mastoids clear. Orbital soft tissues unremarkable. Other: None  IMPRESSION: No acute intracranial abnormality. Electronically Signed   By: Rolm Baptise M.D.   On: 09/11/2018 23:53   Ct Angio Chest Pe W Or Wo Contrast  Result Date: 09/13/2018 CLINICAL DATA:  PE suspected EXAM: CT ANGIOGRAPHY CHEST WITH CONTRAST TECHNIQUE: Multidetector CT imaging of the chest was performed using the standard protocol during bolus administration of intravenous contrast. Multiplanar CT image reconstructions and MIPs were obtained to evaluate the vascular anatomy. CONTRAST:  73mL OMNIPAQUE IOHEXOL 300 MG/ML  SOLN COMPARISON:  Chest radiograph, 09/12/2018, CT chest, 09/24/2016 FINDINGS: Cardiovascular: Satisfactory opacification of the pulmonary arteries to the segmental level. No evidence of pulmonary embolism. Cardiomegaly. Scattered left coronary artery calcifications. No pericardial effusion. Aortic atherosclerosis. Mediastinum/Nodes: No enlarged mediastinal, hilar, or axillary lymph nodes. There is an exophytic 3.1 cm nodule posteriorly from the right lobe of the thyroid, unchanged from prior examination. Trachea, and esophagus demonstrate no significant findings. Lungs/Pleura: Small bilateral pleural effusions and associated atelectasis or consolidation Upper Abdomen: No acute abnormality. Musculoskeletal: No chest wall abnormality. No acute or significant osseous findings. Review of the MIP images confirms the above findings. IMPRESSION: 1.  Negative examination for pulmonary embolism. 2. Small bilateral pleural effusions and associated atelectasis or consolidation. 3. Cardiomegaly, coronary artery disease and aortic atherosclerosis. 4. Unchanged 3.1 cm exophytic nodule of the right lobe of the thyroid. Electronically Signed   By: Eddie Candle M.D.   On: 09/13/2018 12:00   Mr Cervical Spine Wo Contrast  Result Date: 09/12/2018 CLINICAL DATA:  Neck pain, initial exam Radiculopathy neck pain, weakness in legs, inability to walk. Intermittent neck pain. Personal history of Parkinson's.  EXAM: MRI CERVICAL SPINE WITHOUT CONTRAST TECHNIQUE: Multiplanar, multisequence MR imaging of the cervical spine was performed. No intravenous contrast was administered. COMPARISON:  CT of the cervical spine 06/17/2010 FINDINGS: Alignment: Exaggerated cervical lordosis is present. There is slight anterolisthesis at C4-5 and C7-T1. No other significant listhesis is present in the cervical spine. Vertebrae: Mild endplate marrow changes are present at C5-6. Marrow signal and vertebral body heights are otherwise normal. Skull base is normal. Cord: Normal signal is present in the cervical and upper thoracic spinal cord to the lowest imaged level, T3-4. Posterior Fossa, vertebral arteries, paraspinal tissues: Craniocervical junction is normal. Flow is present in the vertebral arteries bilaterally. Visualized intracranial contents are normal. Disc levels: C2-3: Negative. C3-4: Asymmetric right-sided facet hypertrophy is present. There is no significant stenosis. C4-5: There is mild facet hypertrophy bilaterally without significant stenosis. C5-6: A broad-based disc osteophyte complex is present. Bilateral facet hypertrophy is noted. Central canal is patent. Moderate foraminal narrowing is worse right than left due to uncovertebral disease. C6-7: Minimal left-sided uncovertebral spurring is present without significant stenosis. C7-T1: Negative. IMPRESSION: 1. No acute or focal lesion to explain inability to walk weakness in legs. 2. Chronic loss of disc height and endplate degenerative change at C5-6 with moderate foraminal narrowing, right greater than left, secondary to uncovertebral and facet disease. 3. No significant central canal stenosis. 4. No other significant foraminal narrowing in the cervical spine. Electronically Signed   By: San Morelle M.D.   On: 09/12/2018 07:32   Mr Lumbar Spine Wo Contrast  Result Date: 09/12/2018 CLINICAL DATA:  Back pain, > 6wks conservative tx, persistent sx back pain,  weakness of the legs, inability  to walk. EXAM: MRI LUMBAR SPINE WITHOUT CONTRAST TECHNIQUE: Multiplanar, multisequence MR imaging of the lumbar spine was performed. No intravenous contrast was administered. COMPARISON:  None. FINDINGS: Segmentation: 5 non rib-bearing lumbar type vertebral bodies are present. The lowest fully formed vertebral body is L5. Alignment: Normal lumbar lordosis is present. There is no significant listhesis or scoliosis. Vertebrae:  Marrow signal and vertebral body heights are normal. Conus medullaris and cauda equina: Conus extends to the T12 level. Conus and cauda equina appear normal. Paraspinal and other soft tissues: A 9 mm simple cyst is present in the medial aspect of the right kidney. A 3.5 cm right adnexal cyst is present. The urinary bladder is mildly distended. Disc levels: T12-L1: Negative. L1-2: Negative. L2-3: Mild facet hypertrophy is present bilaterally. There is some disc bulging. No significant stenosis is present. L3-4: Moderate facet hypertrophy is present. There is mild disc bulging without significant stenosis. L4-5: Mild facet hypertrophy is present bilaterally. There is no focal disc protrusion or stenosis. L5-S1: Mild facet hypertrophy is present bilaterally. There is no focal disc protrusion or stenosis. IMPRESSION: 1. Mild moderate facet hypertrophy in the lumbar spine without significant disc disease or focal stenosis to explain the patient's inability to walk or lower extremity weakness. 2. 3.5 cm right adnexal cyst. This lesion. Benign. Recommend follow-up ultrasound at 6-12 months for further evaluation. This recommendation follows ACR consensus guidelines: White Paper of the ACR Incidental Findings Committee II on Adnexal Findings. J Am Coll Radiol [phone]. Electronically Signed   By: San Morelle M.D.   On: 09/12/2018 08:00   Dg Chest Port 1 View  Result Date: 09/12/2018 CLINICAL DATA:  Weakness. EXAM: PORTABLE CHEST 1 VIEW COMPARISON:   09/17/2016 FINDINGS: Stable cardiomegaly. Unchanged mediastinal contours with aortic atherosclerosis. Biapical pleuroparenchymal scarring. No acute airspace disease, pulmonary edema, pleural fluid or pneumothorax. EKG lead overlies the chest. No acute osseous abnormalities are seen. IMPRESSION: Stable cardiomegaly without acute abnormality. Aortic Atherosclerosis (ICD10-I70.0). Electronically Signed   By: Keith Rake M.D.   On: 09/12/2018 03:24   Vas [place] Lower Extremity Venous (dvt)  Result Date: 09/14/2018  Lower Venous Study Indications: Elevated Ddimer.  Risk Factors: None identified. Comparison Study: No prior studies. Performing Technologist: Oliver Hum RVT  Examination Guidelines: A complete evaluation includes B-mode imaging, spectral Doppler, color Doppler, and power Doppler as needed of all accessible portions of each vessel. Bilateral testing is considered an integral part of a complete examination. Limited examinations for reoccurring indications may be performed as noted.  +---------+---------------+---------+-----------+----------+--------------+  RIGHT     Compressibility Phasicity Spontaneity Properties Thrombus Aging  +---------+---------------+---------+-----------+----------+--------------+  CFV       Full            Yes       Yes                                    +---------+---------------+---------+-----------+----------+--------------+  SFJ       Full                                                             +---------+---------------+---------+-----------+----------+--------------+  FV Prox   Full                                                             +---------+---------------+---------+-----------+----------+--------------+  FV Mid    Full                                                             +---------+---------------+---------+-----------+----------+--------------+  FV Distal Full                                                              +---------+---------------+---------+-----------+----------+--------------+  PFV       Full                                                             +---------+---------------+---------+-----------+----------+--------------+  POP       Full            Yes       Yes                                    +---------+---------------+---------+-----------+----------+--------------+  PTV       Full                                                             +---------+---------------+---------+-----------+----------+--------------+  PERO      Full                                                             +---------+---------------+---------+-----------+----------+--------------+   +---------+---------------+---------+-----------+----------+--------------+  LEFT      Compressibility Phasicity Spontaneity Properties Thrombus Aging  +---------+---------------+---------+-----------+----------+--------------+  CFV       Full            Yes       Yes                                    +---------+---------------+---------+-----------+----------+--------------+  SFJ       Full                                                             +---------+---------------+---------+-----------+----------+--------------+  FV Prox   Full                                                             +---------+---------------+---------+-----------+----------+--------------+  FV Mid    Full                                                             +---------+---------------+---------+-----------+----------+--------------+  FV Distal Full                                                             +---------+---------------+---------+-----------+----------+--------------+  PFV       Full                                                             +---------+---------------+---------+-----------+----------+--------------+  POP       Full            Yes       Yes                                     +---------+---------------+---------+-----------+----------+--------------+  PTV       Full                                                             +---------+---------------+---------+-----------+----------+--------------+  PERO      Full                                                             +---------+---------------+---------+-----------+----------+--------------+     Summary: Right: There is no evidence of deep vein thrombosis in the lower extremity. No cystic structure found in the popliteal fossa. Left: There is no evidence of deep vein thrombosis in the lower extremity. No cystic structure found in the popliteal fossa.  *See table(s) above for measurements and observations. Electronically signed by Deitra Mayo MD on 09/14/2018 at 1:24:07 PM.    Final     Assessment/Plan:   There are no diagnoses linked to this encounter.   Patient is being discharged with the following home health services:    Patient is being discharged with the following durable medical equipment:    Patient has been advised to f/u with their PCP in 1-2 weeks to for a transitions of care visit.  Social services at their facility was responsible for arranging this appointment.  Pt was provided with adequate prescriptions of noncontrolled medications to reach the scheduled appointment .  For controlled substances, a limited supply was provided as appropriate for the individual patient.  If the pt normally receives these medications from a pain clinic or has a contract with another physician,  these medications should be received from that clinic or physician only).    Future labs/tests needed:  none

## 2018-10-07 LAB — CBC AND DIFFERENTIAL
HCT: 33 — AB (ref 36–46)
Hemoglobin: 10.9 — AB (ref 12.0–16.0)
Platelets: 172 (ref 150–399)
WBC: 7.1

## 2018-10-07 LAB — BASIC METABOLIC PANEL
BUN: 20 (ref 4–21)
Creatinine: 0.8 (ref 0.5–1.1)
Glucose: 91
Potassium: 4 (ref 3.4–5.3)
Sodium: 143 (ref 137–147)

## 2018-10-11 ENCOUNTER — Encounter: Payer: Self-pay | Admitting: *Deleted

## 2018-10-11 ENCOUNTER — Encounter: Payer: Self-pay | Admitting: Internal Medicine

## 2018-10-11 ENCOUNTER — Non-Acute Institutional Stay: Payer: Medicare Other | Admitting: Internal Medicine

## 2018-10-11 DIAGNOSIS — N3941 Urge incontinence: Secondary | ICD-10-CM | POA: Diagnosis not present

## 2018-10-11 DIAGNOSIS — I4891 Unspecified atrial fibrillation: Secondary | ICD-10-CM

## 2018-10-11 DIAGNOSIS — G20 Parkinson's disease: Secondary | ICD-10-CM

## 2018-10-11 DIAGNOSIS — R600 Localized edema: Secondary | ICD-10-CM | POA: Diagnosis not present

## 2018-10-11 DIAGNOSIS — R2681 Unsteadiness on feet: Secondary | ICD-10-CM

## 2018-10-11 LAB — CHLORIDE
Calcium: 9.2
Carbon Dioxide, Total: 28
Chloride: 108
Creatine Kinase Total: 238
EGFR (Non-African Amer.): 72

## 2018-10-11 NOTE — Progress Notes (Deleted)
Location: [facility] Room Number: [id number] Place of Service:  ALF (13)  Provider:   Code Status:  Goals of Care:  Advanced Directives 10/05/2018  Does Patient Have a Medical Advance Directive? Yes  Type of Advance Directive Out of facility DNR (pink MOST or yellow form)  Does patient want to make changes to medical advance directive? No - Patient declined  Copy of [place] in Chart? -  Would patient like information on creating a medical advance directive? No - Patient declined  Pre-existing out of facility DNR order (yellow form or pink MOST form) Yellow form placed in chart (order not valid for inpatient use)     Chief Complaint  Patient presents with   Acute Visit    C/o - stiffness (worsening) and Bruise    HPI: Patient is a 78 y.o. female seen today for an acute visit for Worsening Stiffness and Bruise in her Left Lower leg Patient has a history of Parkinson disease diagnosed 2 years ago on Sinemet and follows with Dr. Enrigue Catena A. fib on Xarelto, hyperlipidemia, H/o Ovarian Cyst, Lower extremity edema,2 D echo with N EF Biatrial enlargement She was in the hospital from 08/22-08/25 for Recurrent Fall, UTI and Rhabdomyolysis  Per Nurses patient was not doing well over the weekend. They were concerned that patient was getting confused about her Sinemet and was not taking it properly.  They noticed that patient is needing increased help with her ADLs and dressing.  She was also having problems with ambulation and she was freezing. Patient also was complaining of increased pain in her legs and groin area.  The the Tylenol was helping her. Today patient seems like she is back to her baseline.  She is her gait is better she is able to get up.  She denies getting confused about her Sinemet. Her groin pain is better also. Patient was also noticed to have a big bruise on her right lower extremity. Denies any history of falls or any other  type trauma.  Patient is on Xarelto  Patient states her urinary incontinence has improved significantly since been on Mybetriq  Past Medical History:  Diagnosis Date   Colles' fracture of right radius 03/05/2015   DEGENERATIVE JOINT DISEASE, RIGHT HIP 02/16/2007   Dupuytren's contracture    Osteoarthritis of hip    right   Osteoarthritis of left knee    Parkinson disease (HCC)    Paroxysmal A-fib ([place])    POSTMENOPAUSAL SYNDROME 02/16/2007   PREMATURE ATRIAL CONTRACTIONS 02/16/2007   S/P breast biopsy, left    two o'clock position - benign   Toxic effect of venom(989.5) 07/27/2007   Venous insufficiency     Past Surgical History:  Procedure Laterality Date   BREAST BIOPSY Left    CATARACT EXTRACTION, BILATERAL      Allergies  Allergen Reactions   Doxycycline Nausea And Vomiting   Sulfonamide Derivatives     REACTION: HIVES    Outpatient Encounter Medications as of 10/11/2018  Medication Sig   Acetaminophen 500 MG coapsule Take 2 capsules by mouth every 8 (eight) hours as needed. Take 2 tablets at bedtime.   calcium carbonate (TUMS EX) 750 MG chewable tablet Chew 1 tablet by mouth daily.   carbidopa-levodopa (SINEMET IR) 25-100 MG tablet Take 1.5 tablets by mouth 4 times a day   Cholecalciferol (VITAMIN D3) 10 MCG (400 UNIT) CAPS Take 1 capsule by mouth daily.   diltiazem (CARDIZEM CD) 120 MG 24 hr  capsule Take 120 mg by mouth daily. HOLD IF SBP < 100   ferrous sulfate 325 (65 FE) MG tablet Take 325 mg by mouth. Once A Day on Mon, Fri   mirabegron ER (MYRBETRIQ) 25 MG TB24 tablet Take 25 mg by mouth daily.   rivaroxaban (XARELTO) 20 MG TABS tablet Take 20 mg by mouth at bedtime.   [DISCONTINUED] XARELTO 20 MG TABS tablet TAKE 1 TABLET BY MOUTH EVERY DAY   No facility-administered encounter medications on file as of 10/11/2018.     Review of Systems:  Review of Systems  Constitutional: Positive for diaphoresis.  Genitourinary: Positive for  frequency.  Musculoskeletal: Positive for gait problem and myalgias.  Skin:       Positive for Bruise  Neurological: Positive for weakness.  All other systems reviewed and are negative.   Health Maintenance  Topic Date Due   INFLUENZA VACCINE  08/21/2018   COLONOSCOPY  06/12/2019   TETANUS/TDAP  03/16/2024   DEXA SCAN  Completed   PNA vac Low Risk Adult  Completed    Physical Exam: Vitals:   10/11/18 1550  BP: 114/70  Pulse: 88  Resp: 18  Temp: 98.5 F (36.9 C)  SpO2: 96%  Weight: 132 lb (59.9 kg)  Height: 5\' 8"  (1.727 m)   Body mass index is 20.07 kg/m. Physical Exam  Labs reviewed: Basic Metabolic Panel: Recent Labs    09/11/18 2105 09/12/18 0304 09/13/18 0201 09/14/18 0043 09/21/18 10/07/18  NA 139 140 140 140 142 143  K 3.7 3.6 3.8 3.9 4.1 4.0  CL 106 108 112* 111 107 108  CO2 22 21* 22 23 28 28   GLUCOSE 99 89 95 109*  --   --   BUN 29* 27* 24* 15 19 20   CREATININE 0.98 0.88 0.75 0.68 0.8 0.8  CALCIUM 9.9 9.2 8.4* 8.5* 9.5 9.2  MG  --   --  2.0  --   --   --   TSH 0.408  --   --   --   --   --    Liver Function Tests: Recent Labs    09/12/18 0304 09/13/18 0201 09/21/18  AST 60* 50* 20  ALT 7 7 20   ALKPHOS 45 36* 42  BILITOT 1.1 0.6  --   PROT 6.2* 4.9* 6.4  ALBUMIN 3.7 2.8* 3.9   No results for input(s): LIPASE, AMYLASE in the last 8760 hours. No results for input(s): AMMONIA in the last 8760 hours. CBC: Recent Labs    09/11/18 2105 09/12/18 0304 09/13/18 0201 09/21/18 10/07/18  WBC 10.6* 8.0 8.9 6.1 7.1  NEUTROABS 9.4*  --  7.1  --   --   HGB 12.6 11.5* 10.5* 11.8* 10.9*  HCT 40.1 35.2* 32.4* 37 33*  MCV 96.4 94.4 93.9  --   --   PLT 170 150 125* 210 172   Lipid Panel: No results for input(s): CHOL, HDL, LDLCALC, TRIG, CHOLHDL, LDLDIRECT in the last 8760 hours. No results found for: HGBA1C  Procedures since last visit: Ct Head Wo Contrast  Result Date: 09/11/2018 CLINICAL DATA:  Head trauma, on anticoagulation EXAM: CT  HEAD WITHOUT CONTRAST TECHNIQUE: Contiguous axial images were obtained from the base of the skull through the vertex without intravenous contrast. COMPARISON:  04/29/2015 FINDINGS: Brain: No acute intracranial abnormality. Specifically, no hemorrhage, hydrocephalus, mass lesion, acute infarction, or significant intracranial injury. Vascular: No hyperdense vessel or unexpected calcification. Skull: No acute calvarial abnormality. Sinuses/Orbits: Visualized paranasal sinuses and mastoids clear.  Orbital soft tissues unremarkable. Other: None IMPRESSION: No acute intracranial abnormality. Electronically Signed   By: Rolm Baptise M.D.   On: 09/11/2018 23:53   Ct Angio Chest Pe W Or Wo Contrast  Result Date: 09/13/2018 CLINICAL DATA:  PE suspected EXAM: CT ANGIOGRAPHY CHEST WITH CONTRAST TECHNIQUE: Multidetector CT imaging of the chest was performed using the standard protocol during bolus administration of intravenous contrast. Multiplanar CT image reconstructions and MIPs were obtained to evaluate the vascular anatomy. CONTRAST:  73mL OMNIPAQUE IOHEXOL 300 MG/ML  SOLN COMPARISON:  Chest radiograph, 09/12/2018, CT chest, 09/24/2016 FINDINGS: Cardiovascular: Satisfactory opacification of the pulmonary arteries to the segmental level. No evidence of pulmonary embolism. Cardiomegaly. Scattered left coronary artery calcifications. No pericardial effusion. Aortic atherosclerosis. Mediastinum/Nodes: No enlarged mediastinal, hilar, or axillary lymph nodes. There is an exophytic 3.1 cm nodule posteriorly from the right lobe of the thyroid, unchanged from prior examination. Trachea, and esophagus demonstrate no significant findings. Lungs/Pleura: Small bilateral pleural effusions and associated atelectasis or consolidation Upper Abdomen: No acute abnormality. Musculoskeletal: No chest wall abnormality. No acute or significant osseous findings. Review of the MIP images confirms the above findings. IMPRESSION: 1.  Negative  examination for pulmonary embolism. 2. Small bilateral pleural effusions and associated atelectasis or consolidation. 3. Cardiomegaly, coronary artery disease and aortic atherosclerosis. 4. Unchanged 3.1 cm exophytic nodule of the right lobe of the thyroid. Electronically Signed   By: Eddie Candle M.D.   On: 09/13/2018 12:00   Mr Cervical Spine Wo Contrast  Result Date: 09/12/2018 CLINICAL DATA:  Neck pain, initial exam Radiculopathy neck pain, weakness in legs, inability to walk. Intermittent neck pain. Personal history of Parkinson's. EXAM: MRI CERVICAL SPINE WITHOUT CONTRAST TECHNIQUE: Multiplanar, multisequence MR imaging of the cervical spine was performed. No intravenous contrast was administered. COMPARISON:  CT of the cervical spine 06/17/2010 FINDINGS: Alignment: Exaggerated cervical lordosis is present. There is slight anterolisthesis at C4-5 and C7-T1. No other significant listhesis is present in the cervical spine. Vertebrae: Mild endplate marrow changes are present at C5-6. Marrow signal and vertebral body heights are otherwise normal. Skull base is normal. Cord: Normal signal is present in the cervical and upper thoracic spinal cord to the lowest imaged level, T3-4. Posterior Fossa, vertebral arteries, paraspinal tissues: Craniocervical junction is normal. Flow is present in the vertebral arteries bilaterally. Visualized intracranial contents are normal. Disc levels: C2-3: Negative. C3-4: Asymmetric right-sided facet hypertrophy is present. There is no significant stenosis. C4-5: There is mild facet hypertrophy bilaterally without significant stenosis. C5-6: A broad-based disc osteophyte complex is present. Bilateral facet hypertrophy is noted. Central canal is patent. Moderate foraminal narrowing is worse right than left due to uncovertebral disease. C6-7: Minimal left-sided uncovertebral spurring is present without significant stenosis. C7-T1: Negative. IMPRESSION: 1. No acute or focal lesion to  explain inability to walk weakness in legs. 2. Chronic loss of disc height and endplate degenerative change at C5-6 with moderate foraminal narrowing, right greater than left, secondary to uncovertebral and facet disease. 3. No significant central canal stenosis. 4. No other significant foraminal narrowing in the cervical spine. Electronically Signed   By: San Morelle M.D.   On: 09/12/2018 07:32   Mr Lumbar Spine Wo Contrast  Result Date: 09/12/2018 CLINICAL DATA:  Back pain, > 6wks conservative tx, persistent sx back pain, weakness of the legs, inability to walk. EXAM: MRI LUMBAR SPINE WITHOUT CONTRAST TECHNIQUE: Multiplanar, multisequence MR imaging of the lumbar spine was performed. No intravenous contrast was administered. COMPARISON:  None. FINDINGS:  Segmentation: 5 non rib-bearing lumbar type vertebral bodies are present. The lowest fully formed vertebral body is L5. Alignment: Normal lumbar lordosis is present. There is no significant listhesis or scoliosis. Vertebrae:  Marrow signal and vertebral body heights are normal. Conus medullaris and cauda equina: Conus extends to the T12 level. Conus and cauda equina appear normal. Paraspinal and other soft tissues: A 9 mm simple cyst is present in the medial aspect of the right kidney. A 3.5 cm right adnexal cyst is present. The urinary bladder is mildly distended. Disc levels: T12-L1: Negative. L1-2: Negative. L2-3: Mild facet hypertrophy is present bilaterally. There is some disc bulging. No significant stenosis is present. L3-4: Moderate facet hypertrophy is present. There is mild disc bulging without significant stenosis. L4-5: Mild facet hypertrophy is present bilaterally. There is no focal disc protrusion or stenosis. L5-S1: Mild facet hypertrophy is present bilaterally. There is no focal disc protrusion or stenosis. IMPRESSION: 1. Mild moderate facet hypertrophy in the lumbar spine without significant disc disease or focal stenosis to explain  the patient's inability to walk or lower extremity weakness. 2. 3.5 cm right adnexal cyst. This lesion. Benign. Recommend follow-up ultrasound at 6-12 months for further evaluation. This recommendation follows ACR consensus guidelines: White Paper of the ACR Incidental Findings Committee II on Adnexal Findings. J Am Coll Radiol [phone]. Electronically Signed   By: San Morelle M.D.   On: 09/12/2018 08:00   Dg Chest Port 1 View  Result Date: 09/12/2018 CLINICAL DATA:  Weakness. EXAM: PORTABLE CHEST 1 VIEW COMPARISON:  09/17/2016 FINDINGS: Stable cardiomegaly. Unchanged mediastinal contours with aortic atherosclerosis. Biapical pleuroparenchymal scarring. No acute airspace disease, pulmonary edema, pleural fluid or pneumothorax. EKG lead overlies the chest. No acute osseous abnormalities are seen. IMPRESSION: Stable cardiomegaly without acute abnormality. Aortic Atherosclerosis (ICD10-I70.0). Electronically Signed   By: Keith Rake M.D.   On: 09/12/2018 03:24   Vas [place] Lower Extremity Venous (dvt)  Result Date: 09/14/2018  Lower Venous Study Indications: Elevated Ddimer.  Risk Factors: None identified. Comparison Study: No prior studies. Performing Technologist: Oliver Hum RVT  Examination Guidelines: A complete evaluation includes B-mode imaging, spectral Doppler, color Doppler, and power Doppler as needed of all accessible portions of each vessel. Bilateral testing is considered an integral part of a complete examination. Limited examinations for reoccurring indications may be performed as noted.  +---------+---------------+---------+-----------+----------+--------------+  RIGHT     Compressibility Phasicity Spontaneity Properties Thrombus Aging  +---------+---------------+---------+-----------+----------+--------------+  CFV       Full            Yes       Yes                                    +---------+---------------+---------+-----------+----------+--------------+  SFJ        Full                                                             +---------+---------------+---------+-----------+----------+--------------+  FV Prox   Full                                                             +---------+---------------+---------+-----------+----------+--------------+  FV Mid    Full                                                             +---------+---------------+---------+-----------+----------+--------------+  FV Distal Full                                                             +---------+---------------+---------+-----------+----------+--------------+  PFV       Full                                                             +---------+---------------+---------+-----------+----------+--------------+  POP       Full            Yes       Yes                                    +---------+---------------+---------+-----------+----------+--------------+  PTV       Full                                                             +---------+---------------+---------+-----------+----------+--------------+  PERO      Full                                                             +---------+---------------+---------+-----------+----------+--------------+   +---------+---------------+---------+-----------+----------+--------------+  LEFT      Compressibility Phasicity Spontaneity Properties Thrombus Aging  +---------+---------------+---------+-----------+----------+--------------+  CFV       Full            Yes       Yes                                    +---------+---------------+---------+-----------+----------+--------------+  SFJ       Full                                                             +---------+---------------+---------+-----------+----------+--------------+  FV Prox   Full                                                             +---------+---------------+---------+-----------+----------+--------------+  FV Mid    Full                                                              +---------+---------------+---------+-----------+----------+--------------+  FV Distal Full                                                             +---------+---------------+---------+-----------+----------+--------------+  PFV       Full                                                             +---------+---------------+---------+-----------+----------+--------------+  POP       Full            Yes       Yes                                    +---------+---------------+---------+-----------+----------+--------------+  PTV       Full                                                             +---------+---------------+---------+-----------+----------+--------------+  PERO      Full                                                             +---------+---------------+---------+-----------+----------+--------------+     Summary: Right: There is no evidence of deep vein thrombosis in the lower extremity. No cystic structure found in the popliteal fossa. Left: There is no evidence of deep vein thrombosis in the lower extremity. No cystic structure found in the popliteal fossa.  *See table(s) above for measurements and observations. Electronically signed by Deitra Mayo MD on 09/14/2018 at 1:24:07 PM.    Final     Assessment/Plan Parkinson's disease ([place]) Over the weekend there was some worry that patient is getting confused about her Sinemet. We are letting patient manage her on medicine so that she can eventually go to her independent apartment. She seems back to her baseline today. We will continue to monitor. Bruise On Xarelto Continue to monitor Will repeat CBC in 1 week  Atrial fibrillation, unspecified type ([place] On Cardizem and XALERTO  Bilateral lower extremity edema sHE is having hard time clearing her TED hoses She does not want to try any diuretic  Urge incontinence of urine   Admitted with Teague Unstable gait Continues to work with  therapy  Non-traumatic rhabdomyolysis Her repeat CK was 280  Labs/tests ordered:  * No order type specified * Next appt:  Visit date not found

## 2018-10-11 NOTE — Progress Notes (Signed)
Location:  [facility] Room Number: [id number] Place of Service:  ALF [phone]) Provider:  Veleta Miners  MD  [name], MD  Patient Care Team: [name], MD as PCP - General (Internal Medicine) [name], [name], MD as PCP - Cardiology (Cardiology) [name], [name], DO as Consulting Physician (Neurology) Mast, Man X, NP as Nurse Practitioner (Internal Medicine)  Extended Emergency Contact Information Primary Emergency Contact: [facility] Address: [address]          [place], [place] [address] [name] of [place] Phone: [phone] Mobile Phone: [phone] Relation: Son Secondary Emergency Contact: Kija, Sheldon Mobile Phone: [phone] Relation: Daughter Interpreter needed? No  Code Status:  DNR Goals of care: Advanced Directive information Advanced Directives 10/05/2018  Does Patient Have a Medical Advance Directive? Yes  Type of Advance Directive Out of facility DNR (pink MOST or yellow form)  Does patient want to make changes to medical advance directive? No - Patient declined  Copy of [place] in Chart? -  Would patient like information on creating a medical advance directive? No - Patient declined  Pre-existing out of facility DNR order (yellow form or pink MOST form) Yellow form placed in chart (order not valid for inpatient use)     Chief Complaint  Patient presents with   Acute Visit    C/o - stiffness (worsening)    HPI:  Pt is a 78 y.o. female seen today for an acute visit for  Patient is a 78 y.o. female seen today for an acute visit for Worsening Stiffness and Bruise in her Left Lower leg Patient has a history of Parkinson disease diagnosed 2 years ago on Sinemet and follows with Dr. Enrigue Catena A. fib on Xarelto, hyperlipidemia, H/o Ovarian Cyst, Lower extremity edema,2 D echo with N EF Biatrial enlargement She was in the hospital from 08/22-08/25 for Recurrent Fall, UTI and  Rhabdomyolysis  Per Nurses patient was not doing well over the weekend. They were concerned that patient was getting confused about her Sinemet and was not taking it properly.  They noticed that patient is needing increased help with her ADLs and dressing.  She was also having problems with ambulation and she was freezing. Patient also was complaining of increased pain in her legs and groin area.  The the Tylenol was helping her. Today patient seems like she is back to her baseline.  She is her gait is better she is able to get up.  She denies getting confused about her Sinemet. Her groin pain is better also. Patient was also noticed to have a big bruise on her right lower extremity. Denies any history of falls or any other type trauma.  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 Take 2 tablets at bedtime.   calcium carbonate (TUMS EX) 750 MG chewable tablet Chew 1 tablet by mouth daily.   carbidopa-levodopa (SINEMET IR) 25-100 MG tablet  Take 1.5 tablets by mouth 4 times a day   Cholecalciferol (VITAMIN D3) 10 MCG (400 UNIT) CAPS Take 1 capsule by mouth daily.   diltiazem (CARDIZEM CD) 120 MG 24 hr capsule Take 120 mg by mouth daily. HOLD IF SBP < 100   ferrous sulfate 325 (65 FE) MG tablet Take 325 mg by mouth. Once A Day on Mon, Fri   mirabegron ER (MYRBETRIQ) 25 MG TB24 tablet Take 25 mg by mouth daily.   rivaroxaban (XARELTO) 20 MG TABS tablet Take 20 mg by mouth at bedtime.   [DISCONTINUED] XARELTO 20 MG TABS tablet TAKE 1 TABLET BY MOUTH EVERY DAY   No facility-administered encounter medications on file as of 10/11/2018.     Review of Systems   Constitutional: Positive for diaphoresis.  Genitourinary: Positive for frequency.  Musculoskeletal: Positive for gait problem and myalgias.  Skin:       Positive for Bruise  Neurological: Positive for weakness.  All other systems reviewed and are negative   Immunization History  Administered Date(s) Administered   Influenza Split 10/20/2012   Influenza Whole 10/21/2006   Influenza, High Dose Seasonal PF 10/17/2016   Influenza-Unspecified 10/20/2013, 10/22/2017   Pneumococcal Conjugate-13 01/31/2013   Pneumococcal Polysaccharide-23 01/20/2005, 11/24/2011   Td 01/21/2004   Tdap 03/16/2014   Zoster 04/17/2009   Pertinent  Health Maintenance Due  Topic Date Due   INFLUENZA VACCINE  08/21/2018   COLONOSCOPY  06/12/2019   DEXA SCAN  Completed   PNA vac Low Risk Adult  Completed   Fall Risk  05/03/2018 11/03/2017 08/04/2017 03/06/2017 11/26/2016  Falls in the past year? 1 No No Yes Yes  Number falls in past yr: 0 - - 2 or more 2 or more  Injury with Fall? 0 - - No No  Risk Factor Category  - - - High Fall Risk High Fall Risk  Risk for fall due to : Other (Comment) - - - -  Follow up Falls evaluation completed;Education provided;Falls prevention discussed - - Falls evaluation completed Falls evaluation completed   Functional Status Survey:     Vitals:   10/11/18 1550  BP: 114/70  Pulse: 88  Resp: 18  Temp: 98.5 F (36.9 C)  SpO2: 96%  Weight: 132 lb (59.9 kg)  Height: 5\' 8"  (1.727 m)   Body mass index is 20.07 kg/m. Physical Exam Vitals signs reviewed.  Constitutional:      Appearance: Normal appearance.  HENT:     Head: Normocephalic.     Nose: Nose normal.     Mouth/Throat:     Mouth: Mucous membranes are moist.     Pharynx: Oropharynx is clear.  Eyes:     Pupils: Pupils are equal, round, and reactive to light.  Neck:     Musculoskeletal: Neck supple.  Cardiovascular:     Rate and Rhythm: Normal rate and regular rhythm.     Pulses: Normal pulses.  Pulmonary:     Effort: Pulmonary effort is normal. No respiratory distress.     Breath sounds:  Normal breath sounds. No wheezing.  Abdominal:     General: Abdomen is flat. Bowel sounds are normal.     Palpations: Abdomen is soft.  Musculoskeletal:     Comments: Moderate Swelling Bilateral Bruise in back of her Right leg  Skin:    General: Skin is warm.  Neurological:     General: No focal deficit present.     Mental Status: She is alert and oriented to person, place, and time.     Comments: Was able to stand and Walk with her [place]  Psychiatric:        Mood and Affect: Mood normal.        Thought Content: Thought content normal.     Labs reviewed: Recent Labs    09/12/18 0304 09/13/18 0201 09/14/18 0043 09/21/18 10/07/18  NA 140 140 140 142 143  K 3.6 3.8 3.9 4.1 4.0  CL 108 112* 111 107 108  CO2 21* 22 23 28 28   GLUCOSE 89 95 109*  --   --   BUN 27* 24* 15 19 20   CREATININE 0.88 0.75 0.68 0.8 0.8  CALCIUM 9.2 8.4* 8.5* 9.5 9.2  MG  --  2.0  --   --   --    Recent Labs    09/12/18 0304 09/13/18 0201 09/21/18  AST 60* 50* 20  ALT 7 7 20   ALKPHOS 45 36* 42  BILITOT 1.1 0.6  --   PROT 6.2* 4.9* 6.4  ALBUMIN 3.7 2.8* 3.9   Recent Labs    09/11/18 2105 09/12/18 0304 09/13/18 0201 09/21/18 10/07/18  WBC 10.6* 8.0 8.9 6.1 7.1   NEUTROABS 9.4*  --  7.1  --   --   HGB 12.6 11.5* 10.5* 11.8* 10.9*  HCT 40.1 35.2* 32.4* 37 33*  MCV 96.4 94.4 93.9  --   --   PLT 170 150 125* 210 172   Lab Results  Component Value Date   TSH 0.408 09/11/2018   No results found for: HGBA1C Lab Results  Component Value Date   CHOL 199 08/29/2015   HDL 88.20 08/29/2015   LDLCALC 103 (H) 08/29/2015   LDLDIRECT 104.2 01/25/2013   TRIG 42.0 08/29/2015   CHOLHDL 2 08/29/2015    Significant Diagnostic Results in last 30 days:  Ct Head Wo Contrast  Result Date: 09/11/2018 CLINICAL DATA:  Head trauma, on anticoagulation EXAM: CT HEAD WITHOUT CONTRAST TECHNIQUE: Contiguous axial images were obtained from the base of the skull through the vertex without intravenous contrast. COMPARISON:  04/29/2015 FINDINGS: Brain: No acute intracranial abnormality. Specifically, no hemorrhage, hydrocephalus, mass lesion, acute infarction, or significant intracranial injury. Vascular: No hyperdense vessel or unexpected calcification. Skull: No acute calvarial abnormality. Sinuses/Orbits: Visualized paranasal sinuses and mastoids clear. Orbital soft tissues unremarkable. Other: None IMPRESSION: No acute intracranial abnormality. Electronically Signed   By: Rolm Baptise M.D.   On: 09/11/2018 23:53   Ct Angio Chest Pe W Or Wo Contrast  Result Date: 09/13/2018 CLINICAL DATA:  PE suspected EXAM: CT ANGIOGRAPHY CHEST WITH CONTRAST TECHNIQUE: Multidetector CT imaging of the chest was performed using the standard protocol during bolus administration of intravenous contrast. Multiplanar CT image reconstructions and MIPs were obtained to evaluate the vascular anatomy. CONTRAST:  33mL OMNIPAQUE IOHEXOL 300 MG/ML  SOLN COMPARISON:  Chest radiograph, 09/12/2018, CT chest, 09/24/2016 FINDINGS: Cardiovascular: Satisfactory opacification of the pulmonary arteries to the segmental level. No evidence of pulmonary embolism. Cardiomegaly. Scattered left coronary artery  calcifications. No pericardial effusion. Aortic atherosclerosis.  Mediastinum/Nodes: No enlarged mediastinal, hilar, or axillary lymph nodes. There is an exophytic 3.1 cm nodule posteriorly from the right lobe of the thyroid, unchanged from prior examination. Trachea, and esophagus demonstrate no significant findings. Lungs/Pleura: Small bilateral pleural effusions and associated atelectasis or consolidation Upper Abdomen: No acute abnormality. Musculoskeletal: No chest wall abnormality. No acute or significant osseous findings. Review of the MIP images confirms the above findings. IMPRESSION: 1.  Negative examination for pulmonary embolism. 2. Small bilateral pleural effusions and associated atelectasis or consolidation. 3. Cardiomegaly, coronary artery disease and aortic atherosclerosis. 4. Unchanged 3.1 cm exophytic nodule of the right lobe of the thyroid. Electronically Signed   By: Eddie Candle M.D.   On: 09/13/2018 12:00   Mr Cervical Spine Wo Contrast  Result Date: 09/12/2018 CLINICAL DATA:  Neck pain, initial exam Radiculopathy neck pain, weakness in legs, inability to walk. Intermittent neck pain. Personal history of Parkinson's. EXAM: MRI CERVICAL SPINE WITHOUT CONTRAST TECHNIQUE: Multiplanar, multisequence MR imaging of the cervical spine was performed. No intravenous contrast was administered. COMPARISON:  CT of the cervical spine 06/17/2010 FINDINGS: Alignment: Exaggerated cervical lordosis is present. There is slight anterolisthesis at C4-5 and C7-T1. No other significant listhesis is present in the cervical spine. Vertebrae: Mild endplate marrow changes are present at C5-6. Marrow signal and vertebral body heights are otherwise normal. Skull base is normal. Cord: Normal signal is present in the cervical and upper thoracic spinal cord to the lowest imaged level, T3-4. Posterior Fossa, vertebral arteries, paraspinal tissues: Craniocervical junction is normal. Flow is present in the vertebral arteries  bilaterally. Visualized intracranial contents are normal. Disc levels: C2-3: Negative. C3-4: Asymmetric right-sided facet hypertrophy is present. There is no significant stenosis. C4-5: There is mild facet hypertrophy bilaterally without significant stenosis. C5-6: A broad-based disc osteophyte complex is present. Bilateral facet hypertrophy is noted. Central canal is patent. Moderate foraminal narrowing is worse right than left due to uncovertebral disease. C6-7: Minimal left-sided uncovertebral spurring is present without significant stenosis. C7-T1: Negative. IMPRESSION: 1. No acute or focal lesion to explain inability to walk weakness in legs. 2. Chronic loss of disc height and endplate degenerative change at C5-6 with moderate foraminal narrowing, right greater than left, secondary to uncovertebral and facet disease. 3. No significant central canal stenosis. 4. No other significant foraminal narrowing in the cervical spine. Electronically Signed   By: San Morelle M.D.   On: 09/12/2018 07:32   Mr Lumbar Spine Wo Contrast  Result Date: 09/12/2018 CLINICAL DATA:  Back pain, > 6wks conservative tx, persistent sx back pain, weakness of the legs, inability to walk. EXAM: MRI LUMBAR SPINE WITHOUT CONTRAST TECHNIQUE: Multiplanar, multisequence MR imaging of the lumbar spine was performed. No intravenous contrast was administered. COMPARISON:  None. FINDINGS: Segmentation: 5 non rib-bearing lumbar type vertebral bodies are present. The lowest fully formed vertebral body is L5. Alignment: Normal lumbar lordosis is present. There is no significant listhesis or scoliosis. Vertebrae:  Marrow signal and vertebral body heights are normal. Conus medullaris and cauda equina: Conus extends to the T12 level. Conus and cauda equina appear normal. Paraspinal and other soft tissues: A 9 mm simple cyst is present in the medial aspect of the right kidney. A 3.5 cm right adnexal cyst is present. The urinary bladder is  mildly distended. Disc levels: T12-L1: Negative. L1-2: Negative. L2-3: Mild facet hypertrophy is present bilaterally. There is some disc bulging. No significant stenosis is present. L3-4: Moderate facet hypertrophy is present. There is mild disc bulging without  significant stenosis. L4-5: Mild facet hypertrophy is present bilaterally. There is no focal disc protrusion or stenosis. L5-S1: Mild facet hypertrophy is present bilaterally. There is no focal disc protrusion or stenosis. IMPRESSION: 1. Mild moderate facet hypertrophy in the lumbar spine without significant disc disease or focal stenosis to explain the patient's inability to walk or lower extremity weakness. 2. 3.5 cm right adnexal cyst. This lesion. Benign. Recommend follow-up ultrasound at 6-12 months for further evaluation. This recommendation follows ACR consensus guidelines: White Paper of the ACR Incidental Findings Committee II on Adnexal Findings. J Am Coll Radiol [phone]. Electronically Signed   By: San Morelle M.D.   On: 09/12/2018 08:00   Dg Chest Port 1 View  Result Date: 09/12/2018 CLINICAL DATA:  Weakness. EXAM: PORTABLE CHEST 1 VIEW COMPARISON:  09/17/2016 FINDINGS: Stable cardiomegaly. Unchanged mediastinal contours with aortic atherosclerosis. Biapical pleuroparenchymal scarring. No acute airspace disease, pulmonary edema, pleural fluid or pneumothorax. EKG lead overlies the chest. No acute osseous abnormalities are seen. IMPRESSION: Stable cardiomegaly without acute abnormality. Aortic Atherosclerosis (ICD10-I70.0). Electronically Signed   By: Keith Rake M.D.   On: 09/12/2018 03:24   Vas [place] Lower Extremity Venous (dvt)  Result Date: 09/14/2018  Lower Venous Study Indications: Elevated Ddimer.  Risk Factors: None identified. Comparison Study: No prior studies. Performing Technologist: Oliver Hum RVT  Examination Guidelines: A complete evaluation includes B-mode imaging, spectral Doppler, color Doppler, and  power Doppler as needed of all accessible portions of each vessel. Bilateral testing is considered an integral part of a complete examination. Limited examinations for reoccurring indications may be performed as noted.  +---------+---------------+---------+-----------+----------+--------------+  RIGHT     Compressibility Phasicity Spontaneity Properties Thrombus Aging  +---------+---------------+---------+-----------+----------+--------------+  CFV       Full            Yes       Yes                                    +---------+---------------+---------+-----------+----------+--------------+  SFJ       Full                                                             +---------+---------------+---------+-----------+----------+--------------+  FV Prox   Full                                                             +---------+---------------+---------+-----------+----------+--------------+  FV Mid    Full                                                             +---------+---------------+---------+-----------+----------+--------------+  FV Distal Full                                                             +---------+---------------+---------+-----------+----------+--------------+  PFV       Full                                                             +---------+---------------+---------+-----------+----------+--------------+  POP       Full            Yes       Yes                                    +---------+---------------+---------+-----------+----------+--------------+  PTV       Full                                                             +---------+---------------+---------+-----------+----------+--------------+  PERO      Full                                                             +---------+---------------+---------+-----------+----------+--------------+   +---------+---------------+---------+-----------+----------+--------------+  LEFT       Compressibility Phasicity Spontaneity Properties Thrombus Aging  +---------+---------------+---------+-----------+----------+--------------+  CFV       Full            Yes       Yes                                    +---------+---------------+---------+-----------+----------+--------------+  SFJ       Full                                                             +---------+---------------+---------+-----------+----------+--------------+  FV Prox   Full                                                             +---------+---------------+---------+-----------+----------+--------------+  FV Mid    Full                                                             +---------+---------------+---------+-----------+----------+--------------+  FV Distal Full                                                             +---------+---------------+---------+-----------+----------+--------------+  PFV       Full                                                             +---------+---------------+---------+-----------+----------+--------------+  POP       Full            Yes       Yes                                    +---------+---------------+---------+-----------+----------+--------------+  PTV       Full                                                             +---------+---------------+---------+-----------+----------+--------------+  PERO      Full                                                             +---------+---------------+---------+-----------+----------+--------------+     Summary: Right: There is no evidence of deep vein thrombosis in the lower extremity. No cystic structure found in the popliteal fossa. Left: There is no evidence of deep vein thrombosis in the lower extremity. No cystic structure found in the popliteal fossa.  *See table(s) above for measurements and observations. Electronically signed by Deitra Mayo MD on 09/14/2018 at 1:24:07 PM.    Final      Assessment/Plan  Parkinson's disease ([place]) Over the weekend there was some worry that patient is getting confused about her Sinemet. We are letting patient manage her on medicine so that she can eventually go to her independent apartment. She seems back to her baseline today. We will continue to monitor. Bruise On Xarelto Continue to monitor Will repeat CBC in 1 week  Atrial fibrillation, unspecified type ([place] On Cardizem and XALERTO  Bilateral lower extremity edema She  is having hard time Wearing her TED hoses She does not want to try any diuretic  Urge incontinence  Improved on Myrbetiq Does not want me to increase the dose yet  Unstable gait Continues to work with therapy  Non-traumatic rhabdomyolysis Her repeat CK was 280   Family/ staff Communication:   Labs/tests ordered:  CBC  Total time spent in this patient care encounter was  25_  minutes; greater than 50% of the visit spent counseling patient and staff, reviewing records , Labs and coordinating care for problems addressed at this encounter.

## 2018-10-14 LAB — CBC AND DIFFERENTIAL
HCT: 31 — AB (ref 36–46)
Hemoglobin: 10.1 — AB (ref 12.0–16.0)
Platelets: 184 (ref 150–399)
WBC: 5.4

## 2018-10-15 ENCOUNTER — Encounter: Payer: Self-pay | Admitting: Nurse Practitioner

## 2018-10-15 DIAGNOSIS — D649 Anemia, unspecified: Secondary | ICD-10-CM

## 2018-10-15 HISTORY — DX: Anemia, unspecified: D64.9

## 2018-10-21 ENCOUNTER — Other Ambulatory Visit: Payer: Medicare Other

## 2018-10-21 ENCOUNTER — Encounter: Payer: Self-pay | Admitting: Internal Medicine

## 2018-10-21 ENCOUNTER — Non-Acute Institutional Stay: Payer: Medicare Other | Admitting: Internal Medicine

## 2018-10-21 DIAGNOSIS — R600 Localized edema: Secondary | ICD-10-CM | POA: Diagnosis not present

## 2018-10-21 DIAGNOSIS — N3281 Overactive bladder: Secondary | ICD-10-CM

## 2018-10-21 DIAGNOSIS — G20 Parkinson's disease: Secondary | ICD-10-CM | POA: Diagnosis not present

## 2018-10-21 DIAGNOSIS — G20A1 Parkinson's disease without dyskinesia, without mention of fluctuations: Secondary | ICD-10-CM

## 2018-10-21 DIAGNOSIS — I959 Hypotension, unspecified: Secondary | ICD-10-CM

## 2018-10-21 DIAGNOSIS — E785 Hyperlipidemia, unspecified: Secondary | ICD-10-CM

## 2018-10-21 DIAGNOSIS — I4891 Unspecified atrial fibrillation: Secondary | ICD-10-CM | POA: Diagnosis not present

## 2018-10-21 NOTE — Progress Notes (Signed)
Location:    Nursing Home Room Number: [id number] Place of Service:  ALF [phone]) Provider:  Veleta Miners MD  [name], MD  Patient Care Team: [name], MD as PCP - General (Internal Medicine) [name], [name], MD as PCP - Cardiology (Cardiology) [name], [name], DO as Consulting Physician (Neurology) Mast, Man X, NP as Nurse Practitioner (Internal Medicine)  Extended Emergency Contact Information Primary Emergency Contact: [facility] Address: [address]          [place], [place] [address] [name] of [place] Phone: [phone] Mobile Phone: [phone] Relation: Son Secondary Emergency Contact: Kortnie, [place] Mobile Phone: [phone] Relation: Daughter Interpreter needed? No  Code Status:  DNR Goals of care: Advanced Directive information Advanced Directives 10/21/2018  Does Patient Have a Medical Advance Directive? Yes  Type of Advance Directive Out of facility DNR (pink MOST or yellow form)  Does patient want to make changes to medical advance directive? No - Patient declined  Copy of [place] in Chart? -  Would patient like information on creating a medical advance directive? -  Pre-existing out of facility DNR order (yellow form or pink MOST form) Yellow form placed in chart (order not valid for inpatient use)     Chief Complaint  Patient presents with  . Acute Visit    LE Swelling    HPI:  Pt is a 78 y.o. female seen today for an acute visit for lower extremity swelling Patient has a history of Parkinson disease diagnosed 2 years ago on Sinemet and follows with Dr. Enrigue Catena A. fib on Xarelto, hyperlipidemia, H/o Ovarian Cyst, Lower extremity edema,2 D echo with N EF Biatrial enlargement She was in the hospital from 08/22-08/25 for Recurrent Fall, UTI and Rhabdomyolysis  Patient is in [place] and friends home.  She has been having increasing swelling in her legs.  That is making her making difficult for her to ambulate  and do her basic ADLs.  She has resisted taking Lasix before because of dizziness.  Patient does run low blood pressure and we had reduced her Cardizem also.  She is more agreeable to take Lasix now. She denies any orthopnea or PND.  She sleeps with her legs elevated on [place] [address]  Patient is doing better with self administering her medication and has not had any problems with her Parkinson and ambulation No recent falls Her urinary incontinence is significantly better since been on Mybetriq  Past Medical History:  Diagnosis Date  . Colles' fracture of right radius 03/05/2015  . DEGENERATIVE JOINT DISEASE, RIGHT HIP 02/16/2007  . Dupuytren's contracture   . Osteoarthritis of hip    right  . Osteoarthritis of left knee   . Parkinson disease ([employer])   . Paroxysmal A-fib ([place])   . POSTMENOPAUSAL SYNDROME 02/16/2007  . PREMATURE ATRIAL CONTRACTIONS 02/16/2007  . S/P breast biopsy, left    two o'clock position - benign  . 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 Venous insufficiency    Past Surgical History:  Procedure Laterality Date  . BREAST BIOPSY Left   . CATARACT EXTRACTION, BILATERAL      Allergies  Allergen Reactions  . Doxycycline Nausea And Vomiting  . Sulfonamide Derivatives     REACTION: HIVES    Allergies as of 10/21/2018      Reactions   Doxycycline Nausea And Vomiting   Sulfonamide Derivatives    REACTION: HIVES      Medication List  Accurate as of October 21, 2018  8:56 AM. If you have any questions, ask your nurse or doctor.        Acetaminophen 500 MG coapsule Take 2 capsules by mouth. Take 2 tablets at bedtime. Every 8 Hours - PRN   calcium carbonate 750 MG chewable tablet Commonly known as: TUMS EX Chew 1 tablet by mouth daily.   carbidopa-levodopa 25-100 MG tablet Commonly known as: SINEMET IR Take 1 tablet by mouth. Four Times A Day What changed: Another medication with the same name was removed. Continue taking this medication, and follow  the directions you see here. Changed by: [name], MD   diltiazem 120 MG 24 hr capsule Commonly known as: CARDIZEM CD Take 120 mg by mouth daily. HOLD IF SBP < 100   ferrous sulfate 325 (65 FE) MG tablet Take 325 mg by mouth. Once A Day on Mon, Fri   Myrbetriq 25 MG Tb24 tablet Generic drug: mirabegron ER Take 25 mg by mouth daily.   rivaroxaban 20 MG Tabs tablet Commonly known as: XARELTO Take 20 mg by mouth at bedtime.   Vitamin D3 10 MCG (400 UNIT) Caps Take 1 capsule by mouth daily.       Review of Systems  Constitutional: Negative.   HENT: Negative.   Cardiovascular: Positive for leg swelling.  Genitourinary: Positive for frequency.  Musculoskeletal: Positive for arthralgias and gait problem.  Neurological: Positive for weakness.  Psychiatric/Behavioral: Negative.   All other systems reviewed and are negative.   Immunization History  Administered Date(s) Administered  . Influenza Split 10/20/2012  . Influenza Whole 10/21/2006  . Influenza, High Dose Seasonal PF 10/17/2016  . Influenza-Unspecified 10/20/2013, 10/22/2017  . Pneumococcal Conjugate-13 01/31/2013  . Pneumococcal Polysaccharide-23 01/20/2005, 11/24/2011  . Td 01/21/2004  . Tdap 03/16/2014  . Zoster 04/17/2009   Pertinent  Health Maintenance Due  Topic Date Due  . INFLUENZA VACCINE  08/21/2018  . COLONOSCOPY  06/12/2019  . DEXA SCAN  Completed  . PNA vac Low Risk Adult  Completed   Fall Risk  05/03/2018 11/03/2017 08/04/2017 03/06/2017 11/26/2016  Falls in the past year? 1 No No Yes Yes  Number falls in past yr: 0 - - 2 or more 2 or more  Injury with Fall? 0 - - No No  Risk Factor Category  - - - High Fall Risk High Fall Risk  Risk for fall due to : Other (Comment) - - - -  Follow up Falls evaluation completed;Education provided;Falls prevention discussed - - Falls evaluation completed Falls evaluation completed   Functional Status Survey:    Vitals:   10/21/18 0849  BP: 102/60   Pulse: 64  Resp: 18  Temp: 98.2 F (36.8 C)  SpO2: 95%  Weight: 134 lb 9.6 oz (61.1 kg)   Body mass index is 20.47 kg/m. Physical Exam Vitals signs reviewed.  Constitutional:      Appearance: Normal appearance.  HENT:     Head: Normocephalic.     Nose: Nose normal.     Mouth/Throat:     Mouth: Mucous membranes are moist.     Pharynx: Oropharynx is clear.  Eyes:     Pupils: Pupils are equal, round, and reactive to light.  Neck:     Musculoskeletal: Neck supple.  Cardiovascular:     Rate and Rhythm: Normal rate. Rhythm irregular.     Pulses: Normal pulses.     Heart sounds: Murmur present.  Pulmonary:     Effort: Pulmonary effort  is normal. No respiratory distress.     Breath sounds: Normal breath sounds. No wheezing or rales.  Abdominal:     General: Abdomen is flat. Bowel sounds are normal.     Palpations: Abdomen is soft.  Musculoskeletal:     Comments: Moderate Swelling Bilateral Very Stiff and Heavy legs due to Fluid  Skin:    General: Skin is warm.  Neurological:     General: No focal deficit present.     Mental Status: She is alert and oriented to person, place, and time.     Comments: Was able to stand and Walk with her [place]   Psychiatric:        Mood and Affect: Mood normal.        Thought Content: Thought content normal.        Judgment: Judgment normal.     Labs reviewed: Recent Labs    09/12/18 0304 09/13/18 0201 09/14/18 0043 09/21/18 10/07/18  NA 140 140 140 142 143  K 3.6 3.8 3.9 4.1 4.0  CL 108 112* 111 107 108  CO2 21* 22 23 28 28   GLUCOSE 89 95 109*  --   --   BUN 27* 24* 15 19 20   CREATININE 0.88 0.75 0.68 0.8 0.8  CALCIUM 9.2 8.4* 8.5* 9.5 9.2  MG  --  2.0  --   --   --    Recent Labs    09/12/18 0304 09/13/18 0201 09/21/18  AST 60* 50* 20  ALT 7 7 20   ALKPHOS 45 36* 42  BILITOT 1.1 0.6  --   PROT 6.2* 4.9* 6.4  ALBUMIN 3.7 2.8* 3.9   Recent Labs    09/11/18 2105 09/12/18 0304 09/13/18 0201 09/21/18 10/07/18 10/14/18   WBC 10.6* 8.0 8.9 6.1 7.1 5.4  NEUTROABS 9.4*  --  7.1  --   --   --   HGB 12.6 11.5* 10.5* 11.8* 10.9* 10.1*  HCT 40.1 35.2* 32.4* 37 33* 31*  MCV 96.4 94.4 93.9  --   --   --   PLT 170 150 125* 210 172 184   Lab Results  Component Value Date   TSH 0.408 09/11/2018   No results found for: HGBA1C Lab Results  Component Value Date   CHOL 199 08/29/2015   HDL 88.20 08/29/2015   LDLCALC 103 (H) 08/29/2015   LDLDIRECT 104.2 01/25/2013   TRIG 42.0 08/29/2015   CHOLHDL 2 08/29/2015    Significant Diagnostic Results in last 30 days:  No results found.  Assessment/Plan Bilateral lower extremity edema Patient had problems with low blood pressure and dizziness with Lasix I am going to try low-dose of metolazone 2.5 mg Monday Wednesday Friday Hold the metolazone if SBP less than 100 We will repeat the BMP in a week Patient is going to let me know if she feels dizzy or weak She is unable to even use the TED hoses due to swelling  Parkinson's disease ([place]) Doing better with  management of medicine Symptoms seems stable today on Sinemet Atrial fibrillation, unspecified type ([facility]) On Cardizem and Xarelto  Overactive bladder Doing better with Myrbetriq  Hyperlipidemia,  Statin was discontinued at request of family due to rhabdomyolysis in the hospital    Family/ staff Communication:   Labs/tests ordered:  Bmp in 1 week  Total time spent in this patient care encounter was  25_  minutes; greater than 50% of the visit spent counseling patient and staff, reviewing records , Labs and coordinating care  for problems addressed at this encounter.

## 2018-10-28 ENCOUNTER — Encounter: Payer: Self-pay | Admitting: Gynecology

## 2018-10-28 LAB — BASIC METABOLIC PANEL
BUN: 19 (ref 4–21)
Creatinine: 0.8 (ref 0.5–1.1)
Glucose: 86
Potassium: 4 (ref 3.4–5.3)
Sodium: 142 (ref 137–147)

## 2018-11-02 ENCOUNTER — Non-Acute Institutional Stay: Payer: Medicare Other | Admitting: Internal Medicine

## 2018-11-02 ENCOUNTER — Encounter: Payer: Self-pay | Admitting: Internal Medicine

## 2018-11-02 DIAGNOSIS — R600 Localized edema: Secondary | ICD-10-CM | POA: Diagnosis not present

## 2018-11-02 DIAGNOSIS — N3281 Overactive bladder: Secondary | ICD-10-CM

## 2018-11-02 DIAGNOSIS — I4891 Unspecified atrial fibrillation: Secondary | ICD-10-CM | POA: Diagnosis not present

## 2018-11-02 DIAGNOSIS — G20 Parkinson's disease: Secondary | ICD-10-CM | POA: Diagnosis not present

## 2018-11-02 LAB — CHLORIDE
Calcium: 9.9
Carbon Dioxide, Total: 28
Chloride: 104

## 2018-11-02 NOTE — Progress Notes (Signed)
Location:    Nursing Home Room Number: [id number] Place of Service:  ALF [phone]) Provider:  [name], MD  [name], MD  Patient Care Team: [name], MD as PCP - General (Internal Medicine) [name], [name], MD as PCP - Cardiology (Cardiology) [name], [name], DO as Consulting Physician (Neurology) Mast, Man X, NP as Nurse Practitioner (Internal Medicine)  Extended Emergency Contact Information Primary Emergency Contact: [facility] Address: [address]          [place], [place] [address] [name] of [place] Phone: [phone] Mobile Phone: [phone] Relation: Son Secondary Emergency Contact: Jamariana, [place] Mobile Phone: [phone] Relation: Daughter Interpreter needed? No  Code Status:  DNR Goals of care: Advanced Directive information Advanced Directives 10/21/2018  Does Patient Have a Medical Advance Directive? Yes  Type of Advance Directive Out of facility DNR (pink MOST or yellow form)  Does patient want to make changes to medical advance directive? No - Patient declined  Copy of [place] in Chart? -  Would patient like information on creating a medical advance directive? -  Pre-existing out of facility DNR order (yellow form or pink MOST form) Yellow form placed in chart (order not valid for inpatient use)     Chief Complaint  Patient presents with  . Acute Visit    LE edema    HPI:  Pt is a 78 y.o. female seen today for an acute visit for follow-up of lower extremities edema  Patient has a history of Parkinson disease diagnosed 2 years ago on Sinemet and follows with Dr. Enrigue Catena A. fib on Xarelto, hyperlipidemia, H/o Ovarian Cyst, Lower extremity edema,2 D echo with N EF Biatrial enlargement urinary incontinence She was in the hospital from 08/22-08/25 for Recurrent Fall, UTI and Rhabdomyolysis  Patient has decided to now permanently live in [place].  She was having increased swelling in her legs.  Initially  she was resistant to start taking Lasix because of dizziness.  I had started her on metolazone.  Patient has done very well with that.  Her swelling is much better.  She has lost almost 4 pounds.  She states that now she can wear her TED hoses.  She denies any dizziness Her BUN and creatinine are normal. Her only complaint today was that it still takes a lot of time to do her ADLs due to worsening Parkinson symptoms.  She also is stressed out because she has to clean up her apartment. She was seen by Urology  for incontinence and they went up on her dose of Mirabetriq Past Medical History:  Diagnosis Date  . Colles' fracture of right radius 03/05/2015  . DEGENERATIVE JOINT DISEASE, RIGHT HIP 02/16/2007  . Dupuytren's contracture   . Osteoarthritis of hip    right  . Osteoarthritis of left knee   . Parkinson disease ([place])   . Paroxysmal A-fib ([place])   . POSTMENOPAUSAL SYNDROME 02/16/2007  . PREMATURE ATRIAL CONTRACTIONS 02/16/2007  . S/P breast biopsy, left    two o'clock position - benign  . 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 Venous insufficiency    Past Surgical History:  Procedure Laterality Date  . BREAST BIOPSY Left   . CATARACT EXTRACTION, BILATERAL      Allergies  Allergen Reactions  . Doxycycline Nausea And Vomiting  . Sulfonamide Derivatives     REACTION: HIVES    Allergies as of 11/02/2018      Reactions   Doxycycline Nausea And Vomiting  Sulfonamide Derivatives    REACTION: HIVES      Medication List       Accurate as of November 02, 2018 11:12 AM. If you have any questions, ask your nurse or doctor.        Acetaminophen 500 MG coapsule Take 2 capsules by mouth. Take 2 tablets at bedtime. Every 8 Hours - PRN   calcium carbonate 750 MG chewable tablet Commonly known as: TUMS EX Chew 1 tablet by mouth daily.   carbidopa-levodopa 25-100 MG tablet Commonly known as: SINEMET IR Take 1 tablet by mouth. Four Times A Day   diltiazem 120 MG 24 hr capsule  Commonly known as: CARDIZEM CD Take 120 mg by mouth daily. HOLD IF SBP < 100   ferrous sulfate 325 (65 FE) MG tablet Take 325 mg by mouth. Once A Day on Mon, Fri   metolazone 2.5 MG tablet Commonly known as: ZAROXOLYN Take 2.5 mg by mouth. Once A Day on Mon, Wed, Fri. Hold if systolic blood pressure less than 100   Myrbetriq 25 MG Tb24 tablet Generic drug: mirabegron ER Take 50 mg by mouth daily.   rivaroxaban 20 MG Tabs tablet Commonly known as: XARELTO Take 20 mg by mouth at bedtime.   Vitamin D3 10 MCG (400 UNIT) Caps Take 1 capsule by mouth daily.       Review of Systems  Constitutional: Negative.   Respiratory: Negative.   Cardiovascular: Positive for leg swelling.  Genitourinary: Positive for frequency.  Musculoskeletal: Positive for gait problem.  Neurological: Positive for weakness.  All other systems reviewed and are negative.   Immunization History  Administered Date(s) Administered  . Influenza Split 10/20/2012  . Influenza Whole 10/21/2006  . Influenza, High Dose Seasonal PF 10/17/2016  . Influenza-Unspecified 10/20/2013, 10/22/2017  . Pneumococcal Conjugate-13 01/31/2013  . Pneumococcal Polysaccharide-23 01/20/2005, 11/24/2011  . Td 01/21/2004  . Tdap 03/16/2014  . Zoster 04/17/2009   Pertinent  Health Maintenance Due  Topic Date Due  . INFLUENZA VACCINE  08/21/2018  . COLONOSCOPY  06/12/2019  . DEXA SCAN  Completed  . PNA vac Low Risk Adult  Completed   Fall Risk  05/03/2018 11/03/2017 08/04/2017 03/06/2017 11/26/2016  Falls in the past year? 1 No No Yes Yes  Number falls in past yr: 0 - - 2 or more 2 or more  Injury with Fall? 0 - - No No  Risk Factor Category  - - - High Fall Risk High Fall Risk  Risk for fall due to : Other (Comment) - - - -  Follow up Falls evaluation completed;Education provided;Falls prevention discussed - - Falls evaluation completed Falls evaluation completed   Functional Status Survey:    Vitals:   11/02/18 1107   BP: 120/71  Pulse: 72  Resp: 18  Temp: 98.6 F (37 C)  SpO2: 95%  Weight: 132 lb 6.4 oz (60.1 kg)  Height: 5\' 8"  (1.727 m)   Body mass index is 20.13 kg/m. Physical Exam Vitals signs reviewed.  Constitutional:      Appearance: Normal appearance.  HENT:     Head: Normocephalic.     Nose: Nose normal.     Mouth/Throat:     Mouth: Mucous membranes are moist.     Pharynx: Oropharynx is clear.  Eyes:     Pupils: Pupils are equal, round, and reactive to light.  Neck:     Musculoskeletal: Neck supple.  Cardiovascular:     Rate and Rhythm: Normal rate. Rhythm irregular.  Pulses: Normal pulses.  Pulmonary:     Effort: Pulmonary effort is normal.     Breath sounds: Normal breath sounds.  Abdominal:     General: Abdomen is flat. Bowel sounds are normal.     Palpations: Abdomen is soft.  Musculoskeletal:     Comments: Bilateral swelling is much improved  Skin:    General: Skin is warm.  Neurological:     Mental Status: She is alert and oriented to person, place, and time.  Psychiatric:        Mood and Affect: Mood normal.        Thought Content: Thought content normal.     Labs reviewed: Recent Labs    09/12/18 0304 09/13/18 0201 09/14/18 0043 09/21/18 10/07/18 10/28/18  NA 140 140 140 142 143 142  K 3.6 3.8 3.9 4.1 4.0 4.0  CL 108 112* 111 107 108 104  CO2 21* 22 23 28 28 28   GLUCOSE 89 95 109*  --   --   --   BUN 27* 24* 15 19 20 19   CREATININE 0.88 0.75 0.68 0.8 0.8 0.8  CALCIUM 9.2 8.4* 8.5* 9.5 9.2 9.9  MG  --  2.0  --   --   --   --    Recent Labs    09/12/18 0304 09/13/18 0201 09/21/18  AST 60* 50* 20  ALT 7 7 20   ALKPHOS 45 36* 42  BILITOT 1.1 0.6  --   PROT 6.2* 4.9* 6.4  ALBUMIN 3.7 2.8* 3.9   Recent Labs    09/11/18 2105 09/12/18 0304 09/13/18 0201 09/21/18 10/07/18 10/14/18  WBC 10.6* 8.0 8.9 6.1 7.1 5.4  NEUTROABS 9.4*  --  7.1  --   --   --   HGB 12.6 11.5* 10.5* 11.8* 10.9* 10.1*  HCT 40.1 35.2* 32.4* 37 33* 31*  MCV 96.4 94.4  93.9  --   --   --   PLT 170 150 125* 210 172 184   Lab Results  Component Value Date   TSH 0.408 09/11/2018   No results found for: HGBA1C Lab Results  Component Value Date   CHOL 199 08/29/2015   HDL 88.20 08/29/2015   LDLCALC 103 (H) 08/29/2015   LDLDIRECT 104.2 01/25/2013   TRIG 42.0 08/29/2015   CHOLHDL 2 08/29/2015    Significant Diagnostic Results in last 30 days:  No results found.  Assessment/Plan .Bilateral lower extremity edema Continue on metolazone Her BUN/creatinine and potassium are normal Repeat BMP in 2 weeks Parkinson's disease ([place]) Patient she still continues to be very unsteady and slow per therapy also Will consider to ,ake her Appointment with Neurology when she stabilizes Atrial fibrillation, unspecified type ([place]) On Cardizem and Xarelto  Overactive bladder Doing better with Myrbetriq Dose was increased by urology She has follow-up appointment with them in 2 weeks  Hyperlipidemia,  Statin was discontinued at request of family due to rhabdomyolysis in the hospital    Family/ staff Communication:   Labs/tests ordered:  Total time spent in this patient care encounter was  25_  minutes; greater than 50% of the visit spent counseling patient and staff, reviewing records , Labs and coordinating care for problems addressed at this encounter.

## 2018-11-05 ENCOUNTER — Encounter: Payer: Self-pay | Admitting: Internal Medicine

## 2018-11-08 NOTE — Telephone Encounter (Signed)
Message routed to [name], MD to review and reply back to patients daughter.

## 2018-11-09 ENCOUNTER — Non-Acute Institutional Stay: Payer: Medicare Other | Admitting: Internal Medicine

## 2018-11-09 ENCOUNTER — Encounter: Payer: Medicare Other | Admitting: Family Medicine

## 2018-11-09 ENCOUNTER — Encounter: Payer: Self-pay | Admitting: Internal Medicine

## 2018-11-09 ENCOUNTER — Ambulatory Visit: Payer: Medicare Other

## 2018-11-09 DIAGNOSIS — R531 Weakness: Secondary | ICD-10-CM

## 2018-11-09 DIAGNOSIS — R600 Localized edema: Secondary | ICD-10-CM | POA: Diagnosis not present

## 2018-11-09 DIAGNOSIS — I4891 Unspecified atrial fibrillation: Secondary | ICD-10-CM

## 2018-11-09 DIAGNOSIS — G20 Parkinson's disease: Secondary | ICD-10-CM

## 2018-11-09 DIAGNOSIS — R2681 Unsteadiness on feet: Secondary | ICD-10-CM | POA: Diagnosis not present

## 2018-11-09 DIAGNOSIS — N3941 Urge incontinence: Secondary | ICD-10-CM

## 2018-11-09 NOTE — Telephone Encounter (Signed)
Reply from patient forwarded to [name], MD

## 2018-11-09 NOTE — Progress Notes (Signed)
Location:    [facility] Home Room Number: [id number] Place of Service:  ALF [phone]) Provider:  [name], MD  [name], MD  Patient Care Team: [name], MD as PCP - General (Internal Medicine) [name], [name], MD as PCP - Cardiology (Cardiology) [name], [name], DO as Consulting Physician (Neurology) Mast, Man X, NP as Nurse Practitioner (Internal Medicine)  Extended Emergency Contact Information Primary Emergency Contact: [facility] Address: [address]          [place], [place] [address] [name] of [place] Phone: [phone] Mobile Phone: [phone] Relation: Son Secondary Emergency Contact: Tiela, Flum Mobile Phone: [phone] Relation: Daughter Interpreter needed? No  Code Status:  DNR Goals of care: Advanced Directive information Advanced Directives 10/21/2018  Does Patient Have a Medical Advance Directive? Yes  Type of Advance Directive Out of facility DNR (pink MOST or yellow form)  Does patient want to make changes to medical advance directive? No - Patient declined  Copy of [place] in Chart? -  Would patient like information on creating a medical advance directive? -  Pre-existing out of facility DNR order (yellow form or pink MOST form) Yellow form placed in chart (order not valid for inpatient use)     Chief Complaint  Patient presents with  . Acute Visit    Follow Up    HPI:  Pt is a 78 y.o. female seen today for an acute visit for Follow up of LE weakness and Edema. Patient has a history of Parkinson disease diagnosed 2 years ago on Sinemet and follows with Dr. Enrigue Catena A. fib on Xarelto, hyperlipidemia, H/o Ovarian Cyst, Lower extremity edema,2 D echo with N EF Biatrial enlargement and Urinary Incontinence.  Patient was seen today as she continues to have weakness in her lower extremity and Unsteady and slow gait. Patient was in better spirits today.  I talked to the therapy and they said that she  does have some good days and she has some days that she is not able to perform her ADLs.  That seems to be concerns by nurses too. Patient is able to do her transfers and walk short distance but it takes her a lot of  time and she gets exhausted at the end of it. Therapy had suggested Wheelchair for her for better mobility. Patient both seem to be very motivated to keep her independence. Her cognition seemed to be good.  Swelling in her legs have improved. Urinary incontinence symptoms are improved.    Past Medical History:  Diagnosis Date  . Colles' fracture of right radius 03/05/2015  . DEGENERATIVE JOINT DISEASE, RIGHT HIP 02/16/2007  . Dupuytren's contracture   . Osteoarthritis of hip    right  . Osteoarthritis of left knee   . Parkinson disease ([place])   . Paroxysmal A-fib ([place])   . POSTMENOPAUSAL SYNDROME 02/16/2007  . PREMATURE ATRIAL CONTRACTIONS 02/16/2007  . S/P breast biopsy, left    two o'clock position - benign  . 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 Venous insufficiency    Past Surgical History:  Procedure Laterality Date  . BREAST BIOPSY Left   . CATARACT EXTRACTION, BILATERAL      Allergies  Allergen Reactions  . Doxycycline Nausea And Vomiting  . Sulfonamide Derivatives     REACTION: HIVES    Allergies as of 11/09/2018      Reactions   Doxycycline Nausea And Vomiting   Sulfonamide Derivatives    REACTION: HIVES  Medication List       Accurate as of November 09, 2018 10:20 AM. If you have any questions, ask your nurse or doctor.        Acetaminophen 500 MG coapsule Take 2 capsules by mouth. Take 2 tablets at bedtime. Every 8 Hours - PRN   calcium carbonate 750 MG chewable tablet Commonly known as: TUMS EX Chew 1 tablet by mouth daily.   carbidopa-levodopa 25-100 MG tablet Commonly known as: SINEMET IR Take 1 tablet by mouth. Four Times A Day   diltiazem 120 MG 24 hr capsule Commonly known as: CARDIZEM CD Take 120 mg by mouth daily.  HOLD IF SBP < 100   ferrous sulfate 325 (65 FE) MG tablet Take 325 mg by mouth. Once A Day on Mon, Fri   metolazone 2.5 MG tablet Commonly known as: ZAROXOLYN Take 2.5 mg by mouth. Once A Day on Mon, Wed, Fri. Hold if systolic blood pressure less than 100   Myrbetriq 25 MG Tb24 tablet Generic drug: mirabegron ER Take 50 mg by mouth daily.   rivaroxaban 20 MG Tabs tablet Commonly known as: XARELTO Take 20 mg by mouth at bedtime.   Tubersol 5 UNIT/0.1ML injection Generic drug: tuberculin Inject into the skin once. Start taking on: November 11, 2018   Vitamin D3 10 MCG (400 UNIT) Caps Take 1 capsule by mouth daily.       Review of Systems  Constitutional: Positive for activity change.  HENT: Negative.   Respiratory: Negative.   Cardiovascular: Positive for leg swelling.  Gastrointestinal: Negative.   Genitourinary: Positive for frequency.  Musculoskeletal: Positive for gait problem.  Skin: Negative.   Neurological: Positive for weakness.  Psychiatric/Behavioral: Negative.     Immunization History  Administered Date(s) Administered  . Influenza Split 10/20/2012  . Influenza Whole 10/21/2006  . Influenza, High Dose Seasonal PF 10/17/2016  . Influenza-Unspecified 10/20/2013, 10/22/2017  . Pneumococcal Conjugate-13 01/31/2013  . Pneumococcal Polysaccharide-23 01/20/2005, 11/24/2011  . Td 01/21/2004  . Tdap 03/16/2014  . Zoster 04/17/2009   Pertinent  Health Maintenance Due  Topic Date Due  . INFLUENZA VACCINE  08/21/2018  . COLONOSCOPY  06/12/2019  . DEXA SCAN  Completed  . PNA vac Low Risk Adult  Completed   Fall Risk  05/03/2018 11/03/2017 08/04/2017 03/06/2017 11/26/2016  Falls in the past year? 1 No No Yes Yes  Number falls in past yr: 0 - - 2 or more 2 or more  Injury with Fall? 0 - - No No  Risk Factor Category  - - - High Fall Risk High Fall Risk  Risk for fall due to : Other (Comment) - - - -  Follow up Falls evaluation completed;Education provided;Falls  prevention discussed - - Falls evaluation completed Falls evaluation completed   Functional Status Survey:    Vitals:   11/09/18 1014  BP: 110/62  Pulse: 60  Resp: 18  Temp: 97.8 F (36.6 C)  SpO2: 96%  Weight: 127 lb 12.8 oz (58 kg)  Height: 5\' 8"  (1.727 m)   Body mass index is 19.43 kg/m. Physical Exam Vitals signs reviewed.  Constitutional:      Appearance: Normal appearance.  HENT:     Head: Normocephalic.     Nose: Nose normal.     Mouth/Throat:     Mouth: Mucous membranes are moist.     Pharynx: Oropharynx is clear.  Eyes:     Pupils: Pupils are equal, round, and reactive to light.  Neck:  Musculoskeletal: Neck supple.  Cardiovascular:     Rate and Rhythm: Normal rate and regular rhythm.     Pulses: Normal pulses.  Pulmonary:     Effort: Pulmonary effort is normal.     Breath sounds: Normal breath sounds.  Abdominal:     General: Abdomen is flat. Bowel sounds are normal.     Palpations: Abdomen is soft.  Musculoskeletal:     Comments: Has Mild swelling Bilateral  Skin:    General: Skin is warm.  Neurological:     Mental Status: She is alert and oriented to person, place, and time.     Comments: Gait was steady with [place] but very slow  Psychiatric:        Mood and Affect: Mood normal.        Thought Content: Thought content normal.     Labs reviewed: Recent Labs    09/12/18 0304 09/13/18 0201 09/14/18 0043 09/21/18 10/07/18 10/28/18  NA 140 140 140 142 143 142  K 3.6 3.8 3.9 4.1 4.0 4.0  CL 108 112* 111 107 108 104  CO2 21* 22 23 28 28 28   GLUCOSE 89 95 109*  --   --   --   BUN 27* 24* 15 19 20 19   CREATININE 0.88 0.75 0.68 0.8 0.8 0.8  CALCIUM 9.2 8.4* 8.5* 9.5 9.2 9.9  MG  --  2.0  --   --   --   --    Recent Labs    09/12/18 0304 09/13/18 0201 09/21/18  AST 60* 50* 20  ALT 7 7 20   ALKPHOS 45 36* 42  BILITOT 1.1 0.6  --   PROT 6.2* 4.9* 6.4  ALBUMIN 3.7 2.8* 3.9   Recent Labs    09/11/18 2105 09/12/18 0304 09/13/18 0201  09/21/18 10/07/18 10/14/18  WBC 10.6* 8.0 8.9 6.1 7.1 5.4  NEUTROABS 9.4*  --  7.1  --   --   --   HGB 12.6 11.5* 10.5* 11.8* 10.9* 10.1*  HCT 40.1 35.2* 32.4* 37 33* 31*  MCV 96.4 94.4 93.9  --   --   --   PLT 170 150 125* 210 172 184   Lab Results  Component Value Date   TSH 0.408 09/11/2018   No results found for: HGBA1C Lab Results  Component Value Date   CHOL 199 08/29/2015   HDL 88.20 08/29/2015   LDLCALC 103 (H) 08/29/2015   LDLDIRECT 104.2 01/25/2013   TRIG 42.0 08/29/2015   CHOLHDL 2 08/29/2015    Significant Diagnostic Results in last 30 days:  No results found.  Assessment/Plan  Unstable gait with Weakness Continue to work with Therapy Possibly will need Wheelchair and Higher level of care  Parkinson's disease [facility]) Appointment with Neurology to see if there is anything more can be done for her Gait and slowness  Bilateral lower extremity edema Has improved on Low dose of Metolazone Will now use it as needed as they had to hold last few doses due to Hypotension Repeat BMP pending Last BMP was stable  Atrial fibrillation, unspecified type ([place]) Doing well on Cardizem and Xarelto  Urge incontinence of urine Doing well on Myrbetriq She wants to wait to see Urology for now    Family/ staff Communication:   Labs/tests ordered:    Total time spent in this patient care encounter was  40_  minutes; greater than 50% of the visit spent counseling patient and staff, reviewing records , Labs and coordinating care for problems  addressed at this encounter.

## 2018-11-10 ENCOUNTER — Telehealth: Payer: Self-pay | Admitting: Neurology

## 2018-11-10 DIAGNOSIS — N3941 Urge incontinence: Secondary | ICD-10-CM | POA: Insufficient documentation

## 2018-11-10 NOTE — Telephone Encounter (Signed)
Please advise to move up sooner appt

## 2018-11-10 NOTE — Telephone Encounter (Signed)
I personally spoke with her doctor.  He said no need to work her in for an in person appt but a telemed visit would be fine.  He specifically noted that nothing acute but that she was slowly getting worse and daughter has been concerned.  He also noted that it was not urgent.  Christy, please remind me when I am back in the office to look for an open appt to see if we can get her in.  Thanks!

## 2018-11-10 NOTE — Telephone Encounter (Signed)
Tywan from [place] called to request an appointment for this patient. She said the patient has increased freezing and needs increased assistance with daily living activities and transitions. They want her to see Dr. Carles Collet as soon as possible.

## 2018-11-11 NOTE — Telephone Encounter (Signed)
Will remind Dr. Carles Collet on Friday the 11/12/2018

## 2018-11-15 ENCOUNTER — Encounter: Payer: Self-pay | Admitting: Internal Medicine

## 2018-11-18 ENCOUNTER — Non-Acute Institutional Stay: Payer: Medicare Other | Admitting: Internal Medicine

## 2018-11-18 DIAGNOSIS — R1030 Lower abdominal pain, unspecified: Secondary | ICD-10-CM

## 2018-11-18 DIAGNOSIS — N3281 Overactive bladder: Secondary | ICD-10-CM

## 2018-11-18 DIAGNOSIS — M25552 Pain in left hip: Secondary | ICD-10-CM

## 2018-11-18 DIAGNOSIS — I4891 Unspecified atrial fibrillation: Secondary | ICD-10-CM

## 2018-11-18 DIAGNOSIS — R600 Localized edema: Secondary | ICD-10-CM | POA: Diagnosis not present

## 2018-11-18 DIAGNOSIS — G20 Parkinson's disease: Secondary | ICD-10-CM

## 2018-11-18 DIAGNOSIS — R2681 Unsteadiness on feet: Secondary | ICD-10-CM

## 2018-11-18 DIAGNOSIS — R300 Dysuria: Secondary | ICD-10-CM

## 2018-11-18 LAB — BASIC METABOLIC PANEL
BUN: 21 (ref 4–21)
CO2: 27 — AB (ref 13–22)
Chloride: 110 — AB (ref 99–108)
Creatinine: 0.7 (ref 0.5–1.1)
Glucose: 83
Potassium: 4.1 (ref 3.4–5.3)
Sodium: 144 (ref 137–147)

## 2018-11-18 LAB — COMPREHENSIVE METABOLIC PANEL: Calcium: 9.3 (ref 8.7–10.7)

## 2018-11-18 NOTE — Progress Notes (Signed)
Location: [place] of Service:  ALF (13)  Provider:   Code Status:  Goals of Care:  Advanced Directives 11/09/2018  Does Patient Have a Medical Advance Directive? Yes  Type of Advance Directive Out of facility DNR (pink MOST or yellow form)  Does patient want to make changes to medical advance directive? No - Patient declined  Copy of [place] in Chart? -  Would patient like information on creating a medical advance directive? -  Pre-existing out of facility DNR order (yellow form or pink MOST form) -     Chief Complaint  Patient presents with  . Acute Visit    HPI: Patient is a 78 y.o. female seen today for an acute visit for Change of functional status, LE edema, Urinary complains and Possible transfer to Higher level of care  Patient has a history of Parkinson disease diagnosed 2 years ago on Sinemet and follows with Dr. Enrigue Catena A. fib on Xarelto, hyperlipidemia, H/o Ovarian Cyst, Lower extremity edema,2 D echo with N EF Biatrial enlargement and Urinary Incontinence.  Patient was seen at request of Nurses as they think she needs higher level of care She is in AL and unable to perform her ADLS Needing increased help with her Transfers. Patient also has increased LE edema as she was taken off metolazone. Has gained almost 5lbs. No Cough or SOB Also c/o Increased Frequency and Dysuria Also having increased fatigue with unable to do Basic ADLS Also c/o Pain in her Groin area and Left hip   Past Medical History:  Diagnosis Date  . Colles' fracture of right radius 03/05/2015  . DEGENERATIVE JOINT DISEASE, RIGHT HIP 02/16/2007  . Dupuytren's contracture   . Osteoarthritis of hip    right  . Osteoarthritis of left knee   . Parkinson disease ([place])   . Paroxysmal A-fib ([place])   . POSTMENOPAUSAL SYNDROME 02/16/2007  . PREMATURE ATRIAL CONTRACTIONS 02/16/2007  . S/P breast biopsy, left    two o'clock position - benign  . 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 Venous insufficiency     Past Surgical History:  Procedure Laterality Date  . BREAST BIOPSY Left   . CATARACT EXTRACTION, BILATERAL      Allergies  Allergen Reactions  . Doxycycline Nausea And Vomiting  . Sulfonamide Derivatives     REACTION: HIVES    Outpatient Encounter Medications as of 11/18/2018  Medication Sig  . Acetaminophen 500 MG coapsule Take 2 capsules by mouth. Take 2 tablets at bedtime. Every 8 Hours - PRN  . calcium carbonate (TUMS EX) 750 MG chewable tablet Chew 1 tablet by mouth daily.  . carbidopa-levodopa (SINEMET IR) 25-100 MG tablet Take 1 tablet by mouth. Four Times A Day  . Cholecalciferol (VITAMIN D3) 10 MCG (400 UNIT) CAPS Take 1 capsule by mouth daily.  Marland Kitchen diltiazem (CARDIZEM CD) 120 MG 24 hr capsule Take 120 mg by mouth daily. HOLD IF SBP < 100  . ferrous sulfate 325 (65 FE) MG tablet Take 325 mg by mouth. Once A Day on Mon, Fri  . metolazone (ZAROXOLYN) 2.5 MG tablet Take 2.5 mg by mouth. Once A Day on Mon, and Thurs. Hold if systolic blood pressure less than 100  . mirabegron ER (MYRBETRIQ) 25 MG TB24 tablet Take 50 mg by mouth daily.   . rivaroxaban (XARELTO) 20 MG TABS tablet Take 20 mg by mouth at bedtime.   No facility-administered encounter medications on file as of 11/18/2018.  Review of Systems:  Review of Systems  Constitutional: Positive for activity change.  HENT: Negative.   Respiratory: Negative.   Cardiovascular: Positive for leg swelling.  Gastrointestinal: Negative.   Genitourinary: Positive for dysuria, flank pain, frequency and urgency.  Musculoskeletal: Positive for arthralgias and gait problem.  Skin: Negative.   Neurological: Positive for weakness.  Psychiatric/Behavioral: Negative.     Health Maintenance  Topic Date Due  . INFLUENZA VACCINE  08/21/2018  . COLONOSCOPY  06/12/2019  . TETANUS/TDAP  03/16/2024  . DEXA SCAN  Completed  . PNA vac Low Risk Adult  Completed     Physical Exam: Vitals:   11/20/18 2137  BP: 110/80  Pulse: 64  Resp: 18  Temp: 98.2 F (36.8 C)  SpO2: 97%   There is no height or weight on file to calculate BMI. Physical Exam Vitals signs reviewed.  Constitutional:      Appearance: Normal appearance.  HENT:     Head: Normocephalic.     Nose: Nose normal.     Mouth/Throat:     Mouth: Mucous membranes are moist.     Pharynx: Oropharynx is clear.  Eyes:     Pupils: Pupils are equal, round, and reactive to light.  Neck:     Musculoskeletal: Neck supple.  Cardiovascular:     Rate and Rhythm: Normal rate. Rhythm irregular.     Heart sounds: Murmur present.  Pulmonary:     Effort: Pulmonary effort is normal.     Breath sounds: Normal breath sounds.  Abdominal:     General: Abdomen is flat. Bowel sounds are normal.     Palpations: Abdomen is soft.  Musculoskeletal:     Comments: Moderate Edema Bilateral  Skin:    General: Skin is warm and dry.  Neurological:     General: No focal deficit present.     Mental Status: She is alert and oriented to person, place, and time.     Comments: Gait Shuffle with very difficult for her to do her transfers  Psychiatric:        Mood and Affect: Mood normal.        Thought Content: Thought content normal.     Labs reviewed: Basic Metabolic Panel: Recent Labs    09/11/18 2105 09/12/18 0304 09/13/18 0201 09/14/18 0043 09/21/18 10/07/18 10/28/18  NA 139 140 140 140 142 143 142  K 3.7 3.6 3.8 3.9 4.1 4.0 4.0  CL 106 108 112* 111 107 108 104  CO2 22 21* 22 23 28 28 28   GLUCOSE 99 89 95 109*  --   --   --   BUN 29* 27* 24* 15 19 20 19   CREATININE 0.98 0.88 0.75 0.68 0.8 0.8 0.8  CALCIUM 9.9 9.2 8.4* 8.5* 9.5 9.2 9.9  MG  --   --  2.0  --   --   --   --   TSH 0.408  --   --   --   --   --   --    Liver Function Tests: Recent Labs    09/12/18 0304 09/13/18 0201 09/21/18  AST 60* 50* 20  ALT 7 7 20   ALKPHOS 45 36* 42  BILITOT 1.1 0.6  --   PROT 6.2* 4.9* 6.4  ALBUMIN  3.7 2.8* 3.9   No results for input(s): LIPASE, AMYLASE in the last 8760 hours. No results for input(s): AMMONIA in the last 8760 hours. CBC: Recent Labs    09/11/18 2105 09/12/18 0304 09/13/18  0201 09/21/18 10/07/18 10/14/18  WBC 10.6* 8.0 8.9 6.1 7.1 5.4  NEUTROABS 9.4*  --  7.1  --   --   --   HGB 12.6 11.5* 10.5* 11.8* 10.9* 10.1*  HCT 40.1 35.2* 32.4* 37 33* 31*  MCV 96.4 94.4 93.9  --   --   --   PLT 170 150 125* 210 172 184   Lipid Panel: No results for input(s): CHOL, HDL, LDLCALC, TRIG, CHOLHDL, LDLDIRECT in the last 8760 hours. No results found for: HGBA1C  Procedures since last visit: No results found.  Assessment/Plan Inguinal pain, with Left hip pain Xray of Pelvis and Left hip Using Tylenol PRN for pain Overactive bladder with Dysuria Will check UA and Culture Also decreased the dose of Myrbetriq  Bilateral lower extremity edema Restart metolazone 2/week Repeat Labs in 2 weeks Her BUN and creat has been stable  Unstable gait Working with therapy Parkinson disease Continue to decline functionally Inspite  of Therapy On Sinemet Plan for transfer to SNF Has follow up with Neurlogy Atrial Fibrillation On Cardizem and Xarelto    Labs/tests ordered:  * No order type specified * Next appt:  Visit date not found  Total time spent in this patient care encounter was  40_  minutes; greater than 50% of the visit spent counseling patient and staff, reviewing records , Labs and coordinating care for problems addressed at this encounter.

## 2018-11-20 ENCOUNTER — Encounter: Payer: Self-pay | Admitting: Internal Medicine

## 2018-11-22 ENCOUNTER — Non-Acute Institutional Stay: Payer: Medicare Other | Admitting: Nurse Practitioner

## 2018-11-22 ENCOUNTER — Encounter: Payer: Self-pay | Admitting: Nurse Practitioner

## 2018-11-22 ENCOUNTER — Other Ambulatory Visit: Payer: Self-pay | Admitting: Internal Medicine

## 2018-11-22 DIAGNOSIS — G20 Parkinson's disease: Secondary | ICD-10-CM

## 2018-11-22 DIAGNOSIS — N3000 Acute cystitis without hematuria: Secondary | ICD-10-CM | POA: Diagnosis not present

## 2018-11-22 DIAGNOSIS — N390 Urinary tract infection, site not specified: Secondary | ICD-10-CM | POA: Insufficient documentation

## 2018-11-22 DIAGNOSIS — I4819 Other persistent atrial fibrillation: Secondary | ICD-10-CM

## 2018-11-22 DIAGNOSIS — M159 Polyosteoarthritis, unspecified: Secondary | ICD-10-CM

## 2018-11-22 DIAGNOSIS — R600 Localized edema: Secondary | ICD-10-CM

## 2018-11-22 DIAGNOSIS — N3281 Overactive bladder: Secondary | ICD-10-CM | POA: Diagnosis not present

## 2018-11-22 DIAGNOSIS — Z87440 Personal history of urinary (tract) infections: Secondary | ICD-10-CM | POA: Insufficient documentation

## 2018-11-22 NOTE — Assessment & Plan Note (Signed)
Chronic, continue Metolazone 2.5mg 2x/wk 

## 2018-11-22 NOTE — Telephone Encounter (Signed)
Message routed to Horatio 3 days ago. I will reroute today.   If no action required please close encounter

## 2018-11-22 NOTE — Assessment & Plan Note (Signed)
Heart rate is in control, continue Diltiazem 120mg  qd, Xarelto 20mg  qd.

## 2018-11-22 NOTE — Assessment & Plan Note (Signed)
11/22/18 urine culture P. Mirabilis Cipro 500mg  bid x 7 days.

## 2018-11-22 NOTE — Progress Notes (Addendum)
Location:   [facility] Room Number: [id number] Place of Service:  [facility] (13)[facility] Provider: [facility] Aveleen Nevers NP  [name], MD  Patient Care Team: [name], MD as PCP - General (Internal Medicine) [name], [name], MD as PCP - Cardiology (Cardiology) [name], [name], DO as Consulting Physician (Neurology) Dalynn Jhaveri X, NP as Nurse Practitioner (Internal Medicine)  Extended Emergency Contact Information Primary Emergency Contact: [facility] Address: [address]          [place], [place] [address] [name] of [place] Phone: [phone] Mobile Phone: [phone] Relation: Son Secondary Emergency Contact: Ha, Reinking Mobile Phone: [phone] Relation: Daughter Interpreter needed? No  Code Status: DNR Goals of care: Advanced Directive information Advanced Directives 11/09/2018  Does Patient Have a Medical Advance Directive? Yes  Type of Advance Directive Out of facility DNR (pink MOST or yellow form)  Does patient want to make changes to medical advance directive? No - Patient declined  Copy of [place] in Chart? -  Would patient like information on creating a medical advance directive? -  Pre-existing out of facility DNR order (yellow form or pink MOST form) -     Chief Complaint  Patient presents with   Acute Visit    UTI    HPI:  Pt is a 78 y.o. female seen today for an acute visit for UTI, urine culture showed P. MIrabilis >100,000c/ml, susceptible to Cipro.   Hx of UTI. Hx of OAB-taking Mirabegron 25mg  qd. AFib, heart rate is in control, on Diltiazem 120mg  qd, Xarelto 20mg  qd. Chronic edema BLE, on Metolazone 2.5mg  2x/wk. Parkinson's, maintained on Sinemet 25/100mg  qid. OA pain, stable, on Tylenol 1000mg  qhs.    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 If you have any questions, ask your nurse or doctor.        Acetaminophen 500 MG coapsule Take 2 capsules by mouth. Take 2 tablets at bedtime. Every 8 Hours - PRN   calcium carbonate 750 MG chewable tablet Commonly known as: TUMS EX Chew 1 tablet by mouth daily.   carbidopa-levodopa 25-100 MG tablet Commonly known as: SINEMET IR Take 1 tablet by mouth. Four Times A Day   diltiazem 120 MG 24 hr capsule Commonly known as: CARDIZEM CD Take 120 mg by mouth daily. HOLD IF SBP < 100   ferrous sulfate 325 (65 FE) MG tablet Take 325 mg by mouth. Once A Day  on Mon, Fri   metolazone 2.5 MG tablet Commonly known as: ZAROXOLYN Take 2.5 mg by mouth. Once A Day on Mon, and Thurs. Hold if systolic blood pressure less than 100   Myrbetriq 25 MG Tb24 tablet Generic drug: mirabegron ER Take 50 mg by mouth daily.   rivaroxaban 20 MG Tabs tablet Commonly known as: XARELTO Take 20 mg by mouth at bedtime.   Vitamin D3 10 MCG (400 UNIT) Caps Take 1 capsule by mouth daily.       Review of Systems  Constitutional: Negative for activity change, appetite change, chills, diaphoresis, fatigue and fever.  HENT: Positive for hearing loss. Negative for congestion and voice  change.   Respiratory: Negative for cough, shortness of breath and wheezing.   Cardiovascular: Positive for leg swelling. Negative for chest pain and palpitations.  Gastrointestinal: Negative for abdominal distention, abdominal pain, constipation, diarrhea, nausea and vomiting.  Genitourinary: Positive for dysuria, frequency and urgency. Negative for difficulty urinating.  Musculoskeletal: Positive for arthralgias and gait problem.       Left hip/inguinal pain, pending X-ray pelvis/left hip. Right groin pain comes and goes, it transferred to the left groin when lying on the right side, nature of pain is spastic pain.   Skin: Negative for color change and pallor.  Neurological: Negative for dizziness, speech difficulty, weakness and headaches.  Psychiatric/Behavioral: Negative for agitation, behavioral problems, hallucinations and sleep disturbance. The patient is not nervous/anxious.     Immunization History  Administered Date(s) Administered   Influenza Split 10/20/2012   Influenza Whole 10/21/2006   Influenza, High Dose Seasonal PF 10/17/2016   Influenza-Unspecified 10/20/2013, 10/22/2017   Pneumococcal Conjugate-13 01/31/2013   Pneumococcal Polysaccharide-23 01/20/2005, 11/24/2011   Td 01/21/2004   Tdap 03/16/2014   Zoster 04/17/2009   Pertinent  Health Maintenance Due  Topic Date Due   INFLUENZA VACCINE  08/21/2018   COLONOSCOPY  06/12/2019   DEXA SCAN  Completed   PNA vac Low Risk Adult  Completed   Fall Risk  05/03/2018 11/03/2017 08/04/2017 03/06/2017 11/26/2016  Falls in the past year? 1 No No Yes Yes  Number falls in past yr: 0 - - 2 or more 2 or more  Injury with Fall? 0 - - No No  Risk Factor Category  - - - High Fall Risk High Fall Risk  Risk for fall due to : Other (Comment) - - - -  Follow up Falls evaluation completed;Education provided;Falls prevention discussed - - Falls evaluation completed Falls evaluation completed   Functional Status Survey:     Vitals:   11/22/18 1214  BP: 110/80  Pulse: 64  Resp: 18  Temp: 98.3 F (36.8 C)  SpO2: 97%   There is no height or weight on file to calculate BMI. Physical Exam Vitals signs and nursing note reviewed.  Constitutional:      General: She is not in acute distress.    Appearance: Normal appearance. She is not ill-appearing, toxic-appearing or diaphoretic.  HENT:     Head: Normocephalic and atraumatic.     Nose: Nose normal.     Mouth/Throat:     Mouth: Mucous membranes are moist.  Eyes:     Extraocular Movements: Extraocular movements intact.     Conjunctiva/sclera: Conjunctivae normal.     Pupils: Pupils are equal, round, and reactive to light.  Neck:     Musculoskeletal: Normal range of motion and neck supple.  Cardiovascular:     Rate and Rhythm: Normal rate. Rhythm irregular.  Heart sounds: Murmur present.  Pulmonary:     Breath sounds: No wheezing, rhonchi or rales.  Abdominal:     General: Bowel sounds are normal. There is no distension.     Palpations: Abdomen is soft.     Tenderness: There is no abdominal tenderness. There is no right CVA tenderness, left CVA tenderness, guarding or rebound.  Musculoskeletal:     Right lower leg: Edema present.     Left lower leg: Edema present.     Comments: 1-2+  edema BLE, slow movement, unsteady gait with [place].   Skin:    General: Skin is warm and dry.  Neurological:     General: No focal deficit present.     Mental Status: She is alert and oriented to person, place, and time. Mental status is at baseline.     Cranial Nerves: No cranial nerve deficit.     Motor: No weakness.     Coordination: Coordination abnormal.     Gait: Gait abnormal.     Comments: Stiffness allover  Psychiatric:        Mood and Affect: Mood normal.        Thought Content: Thought content normal.        Judgment: Judgment normal.     Labs reviewed: Recent Labs    09/12/18 0304 09/13/18 0201 09/14/18 0043 09/21/18 10/07/18 10/28/18   NA 140 140 140 142 143 142  K 3.6 3.8 3.9 4.1 4.0 4.0  CL 108 112* 111 107 108 104  CO2 21* 22 23 28 28 28   GLUCOSE 89 95 109*  --   --   --   BUN 27* 24* 15 19 20 19   CREATININE 0.88 0.75 0.68 0.8 0.8 0.8  CALCIUM 9.2 8.4* 8.5* 9.5 9.2 9.9  MG  --  2.0  --   --   --   --    Recent Labs    09/12/18 0304 09/13/18 0201 09/21/18  AST 60* 50* 20  ALT 7 7 20   ALKPHOS 45 36* 42  BILITOT 1.1 0.6  --   PROT 6.2* 4.9* 6.4  ALBUMIN 3.7 2.8* 3.9   Recent Labs    09/11/18 2105 09/12/18 0304 09/13/18 0201 09/21/18 10/07/18 10/14/18  WBC 10.6* 8.0 8.9 6.1 7.1 5.4  NEUTROABS 9.4*  --  7.1  --   --   --   HGB 12.6 11.5* 10.5* 11.8* 10.9* 10.1*  HCT 40.1 35.2* 32.4* 37 33* 31*  MCV 96.4 94.4 93.9  --   --   --   PLT 170 150 125* 210 172 184   Lab Results  Component Value Date   TSH 0.408 09/11/2018   No results found for: HGBA1C Lab Results  Component Value Date   CHOL 199 08/29/2015   HDL 88.20 08/29/2015   LDLCALC 103 (H) 08/29/2015   LDLDIRECT 104.2 01/25/2013   TRIG 42.0 08/29/2015   CHOLHDL 2 08/29/2015    Significant Diagnostic Results in last 30 days:  No results found.  Assessment/Plan: UTI (urinary tract infection) 11/22/18 urine culture P. Mirabilis Cipro 500mg  bid x 7 days.   Overactive bladder Non urinary retention, continue Mirbetriq 25mg  qd.   Atrial fibrillation (HCC) Heart rate is in control, continue Diltiazem 120mg  qd, Xarelto 20mg  qd.   Parkinson's disease ([place]) F/u neurology, continue Sinemet 25/100mg  qid.   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   Bilateral lower extremity edema Chronic, continue Metolazone  2.5mg  2x/wk.     Family/ staff Communication: plan of care reviewed with the patient and charge nurse.   Labs/tests ordered:  None  Time spend 40 minutes.

## 2018-11-22 NOTE — Assessment & Plan Note (Signed)
Non urinary retention, continue Mirbetriq 25mg  qd.

## 2018-11-22 NOTE — Assessment & Plan Note (Signed)
Pain is controlled, continue Tylenol 1000mg  qd.

## 2018-11-22 NOTE — Assessment & Plan Note (Signed)
F/u neurology, continue Sinemet 25/100mg  qid.

## 2018-11-25 ENCOUNTER — Other Ambulatory Visit: Payer: Self-pay

## 2018-11-25 ENCOUNTER — Ambulatory Visit: Payer: Medicare Other | Admitting: Neurology

## 2018-11-30 ENCOUNTER — Encounter: Payer: Self-pay | Admitting: Internal Medicine

## 2018-11-30 ENCOUNTER — Non-Acute Institutional Stay (SKILLED_NURSING_FACILITY): Payer: Medicare Other | Admitting: Internal Medicine

## 2018-11-30 DIAGNOSIS — N3281 Overactive bladder: Secondary | ICD-10-CM | POA: Diagnosis not present

## 2018-11-30 DIAGNOSIS — G20 Parkinson's disease: Secondary | ICD-10-CM

## 2018-11-30 DIAGNOSIS — E785 Hyperlipidemia, unspecified: Secondary | ICD-10-CM

## 2018-11-30 DIAGNOSIS — R600 Localized edema: Secondary | ICD-10-CM

## 2018-11-30 DIAGNOSIS — I4819 Other persistent atrial fibrillation: Secondary | ICD-10-CM

## 2018-11-30 DIAGNOSIS — N3000 Acute cystitis without hematuria: Secondary | ICD-10-CM

## 2018-11-30 NOTE — Progress Notes (Signed)
Provider:  [name], MD Location:    [facility] Room Number: 13 Place of Service:  SNF [phone])  PCP: [name], MD Patient Care Team: [name], MD as PCP - General (Internal Medicine) [name], [name], MD as PCP - Cardiology (Cardiology) [name], [name], DO as Consulting Physician (Neurology) Mast, Man X, NP as Nurse Practitioner (Internal Medicine)  Extended Emergency Contact Information Primary Emergency Contact: [facility] Address: [address]          [place], [place] [address] [name] of [place] Phone: [phone] Mobile Phone: [phone] Relation: Son Secondary Emergency Contact: Aimie, Masin Mobile Phone: [phone] Relation: Daughter Interpreter needed? No  Code Status: DNR Goals of Care: Advanced Directive information Advanced Directives 11/09/2018  Does Patient Have a Medical Advance Directive? Yes  Type of Advance Directive Out of facility DNR (pink MOST or yellow form)  Does patient want to make changes to medical advance directive? No - Patient declined  Copy of [place] in Chart? -  Would patient like information on creating a medical advance directive? -  Pre-existing out of facility DNR order (yellow form or pink MOST form) -      Chief Complaint  Patient presents with   New Admit To SNF    Readmission    HPI: Patient is a 78 y.o. female seen today for Readmission to SNF level of care from [place]  Patient has a history of Parkinson disease diagnosed 2 years ago on Sinemet and follows with Dr. Enrigue Catena A. fib on Xarelto, hyperlipidemia, H/o Ovarian Cyst, Lower extremity edema,2 D echo with N EF Biatrial enlargement and Urinary Incontinence.  Was initially admitted in the hospital from her [place] from 08/22-08/25 for Recurrent Fall, UTI and Rhabdomyolysis She received therapy.  And was transferred to [place].  But in AL patient continued to need increased support with her ADLs.  Continues to be very  slow and weak. Unable to keep up with her ADLS  in spite of getting intensive therapy.  Finally the family agreed for her to be transferred to SNF for long-term care. During this time patient was diagnosed with UTI again. She was treated with Cipro Today she continues to complain of some bladder spasm. Is already on Mybetriq but does have urinary incontinence. Lower extremity edema Got better on metolazone Parkinson disease Continues to be very slow and needing support for her ADLs Unexplained muscular spasms at night By Dr. Carles Collet she was thinking it can be due to Parkinson.  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 She has never used smokeless tobacco. She reports that she does not drink alcohol or use drugs. Social History   Socioeconomic History   Marital status: Divorced    Spouse name: Not on file   Number of  children: 2   Years of education: Not on file   Highest education level: Master's degree (e.g., MA, MS, MEng, MEd, MSW, MBA)  Occupational History   Occupation: retired    Comment: math, Hydrologist  Social Designer, fashion/clothing strain: Not on file   Food insecurity    Worry: Not on file    Inability: Not on file   Transportation needs    Medical: Not on file    Non-medical: Not on file  Tobacco Use   Smoking status: Former Smoker    Quit date:  08/27/1971    Years since quitting: 47.2   Smokeless tobacco: Never Used  Substance and Sexual Activity   Alcohol use: No    Alcohol/week: 0.0 standard drinks   Drug use: No   Sexual activity: Not Currently    Comment: 1st intercourse 78 yo-Fewer than 5 partners  Lifestyle   Physical activity    Days per week: Not on file    Minutes per session: Not on file   Stress: Not on file  Relationships   Social connections    Talks on phone: Not on file    Gets together: Not on file    Attends religious service: Not on file    Active member of club or organization: Not on file    Attends meetings of clubs or organizations: Not on file    Relationship status: Not on file   Intimate partner violence    Fear of current or ex partner: Not on file    Emotionally abused: Not on file    Physically abused: Not on file    Forced sexual activity: Not on file  Other Topics Concern   Not on file  Social History Narrative   Not on file    Functional Status Survey:    Family History  Problem Relation Age of Onset   Cancer Father        ? type   Alcohol abuse Father    Cancer Mother        Pancreatic   Diabetes Mother    Heart failure Mother    Osteoarthritis Sister    Healthy Sister    Healthy Child    Breast cancer Neg Hx     Health Maintenance  Topic Date Due   INFLUENZA VACCINE  08/21/2018   COLONOSCOPY  06/12/2019   TETANUS/TDAP  03/16/2024   DEXA SCAN  Completed   PNA vac Low Risk Adult  Completed    Allergies  Allergen Reactions   Doxycycline Nausea And Vomiting   Sulfonamide Derivatives     REACTION: HIVES    Allergies as of 11/30/2018      Reactions   Doxycycline Nausea And Vomiting   Sulfonamide Derivatives    REACTION: HIVES      Medication List       Accurate as of November 30, 2018  1:54 PM. If you have any questions, ask your nurse or doctor.        Acetaminophen 500 MG coapsule Take 2 capsules by mouth. Take 2 tablets at  bedtime. Every 8 Hours - PRN   calcium carbonate 750 MG chewable tablet Commonly known as: TUMS EX Chew 1 tablet by mouth daily.   carbidopa-levodopa 25-100 MG tablet Commonly known as: SINEMET IR Take 1 tablet by mouth. Four Times A Day   diltiazem 120 MG 24 hr capsule Commonly known as: CARDIZEM CD Take 120 mg by mouth daily. HOLD IF SBP < 100  ferrous sulfate 325 (65 FE) MG tablet Take 325 mg by mouth. Once A Day on Mon, Fri   metolazone 2.5 MG tablet Commonly known as: ZAROXOLYN Take 2.5 mg by mouth. Once A Day on Mon, and Thurs. Hold if systolic blood pressure less than 100   Myrbetriq 25 MG Tb24 tablet Generic drug: mirabegron ER Take 25 mg by mouth daily.   rivaroxaban 20 MG Tabs tablet Commonly known as: XARELTO Take 20 mg by mouth at bedtime.   Vitamin D3 10 MCG (400 UNIT) Caps Take 1 capsule by mouth daily.   zinc oxide 20 % ointment Apply 1 application topically as needed for irritation. To buttocks after every incontinent episode and as needed for redness. May keep at bedside.       Review of Systems  Constitutional: Positive for activity change.  HENT: Negative.   Respiratory: Negative.   Cardiovascular: Positive for leg swelling.  Gastrointestinal: Negative.   Genitourinary: Positive for pelvic pain and urgency.  Musculoskeletal: Positive for gait problem.  Skin: Negative.   Neurological: Positive for weakness.  Psychiatric/Behavioral: Negative.     Vitals:   11/30/18 1350  BP: 126/74  Pulse: 88  Resp: 18  Temp: 97.8 F (36.6 C)  SpO2: 96%  Weight: 130 lb 1.6 oz (59 kg)  Height: 5\' 8"  (1.727 m)   Body mass index is 19.78 kg/m. Physical Exam Vitals signs reviewed.  Constitutional:      Appearance: Normal appearance.  HENT:     Head: Normocephalic.     Nose: Nose normal.     Mouth/Throat:     Mouth: Mucous membranes are moist.     Pharynx: Oropharynx is clear.  Eyes:     Pupils: Pupils are equal, round, and reactive to light.    Neck:     Musculoskeletal: Neck supple.  Cardiovascular:     Rate and Rhythm: Normal rate and regular rhythm.     Pulses: Normal pulses.  Pulmonary:     Effort: Pulmonary effort is normal.     Breath sounds: Normal breath sounds.  Abdominal:     General: Abdomen is flat. Bowel sounds are normal.     Palpations: Abdomen is soft.  Musculoskeletal:     Comments: Mild swelling bilateral  Skin:    General: Skin is warm.  Neurological:     General: No focal deficit present.     Mental Status: She is alert and oriented to person, place, and time.     Comments: Gait very slow and shuffled. Some assist with transfers Need Yvaine to walk Rigidity in All extremities  Psychiatric:        Mood and Affect: Mood normal.        Thought Content: Thought content normal.     Labs reviewed: Basic Metabolic Panel: Recent Labs    09/12/18 0304 09/13/18 0201 09/14/18 0043  10/07/18 10/28/18 11/18/18  NA 140 140 140   < > 143 142 144  K 3.6 3.8 3.9   < > 4.0 4.0 4.1  CL 108 112* 111   < > 108 104 110*  CO2 21* 22 23   < > 28 28 27*  GLUCOSE 89 95 109*  --   --   --   --   BUN 27* 24* 15   < > 20 19 21   CREATININE 0.88 0.75 0.68   < > 0.8 0.8 0.7  CALCIUM 9.2 8.4* 8.5*   < > 9.2 9.9 9.3  MG  --  2.0  --   --   --   --   --    < > = values in this interval not displayed.   Liver Function Tests: Recent Labs    09/12/18 0304 09/13/18 0201 09/21/18  AST 60* 50* 20  ALT 7 7 20   ALKPHOS 45 36* 42  BILITOT 1.1 0.6  --   PROT 6.2* 4.9* 6.4  ALBUMIN 3.7 2.8* 3.9   No results for input(s): LIPASE, AMYLASE in the last 8760 hours. No results for input(s): AMMONIA in the last 8760 hours. CBC: Recent Labs    09/11/18 2105 09/12/18 0304 09/13/18 0201 09/21/18 10/07/18 10/14/18  WBC 10.6* 8.0 8.9 6.1 7.1 5.4  NEUTROABS 9.4*  --  7.1  --   --   --   HGB 12.6 11.5* 10.5* 11.8* 10.9* 10.1*  HCT 40.1 35.2* 32.4* 37 33* 31*  MCV 96.4 94.4 93.9  --   --   --   PLT 170 150 125* 210 172 184    Cardiac Enzymes: Recent Labs    09/13/18 0201 09/14/18 0043 10/07/18  CKTOTAL 1,099* 599* 238   BNP: Invalid input(s): POCBNP No results found for: HGBA1C Lab Results  Component Value Date   TSH 0.408 09/11/2018   Lab Results  Component Value Date   VITAMINB12 1,217 (H) 09/12/2018   No results found for: FOLATE No results found for: IRON, TIBC, FERRITIN  Imaging and Procedures obtained prior to SNF admission: Ct Head Wo Contrast  Result Date: 09/11/2018 CLINICAL DATA:  Head trauma, on anticoagulation EXAM: CT HEAD WITHOUT CONTRAST TECHNIQUE: Contiguous axial images were obtained from the base of the skull through the vertex without intravenous contrast. COMPARISON:  04/29/2015 FINDINGS: Brain: No acute intracranial abnormality. Specifically, no hemorrhage, hydrocephalus, mass lesion, acute infarction, or significant intracranial injury. Vascular: No hyperdense vessel or unexpected calcification. Skull: No acute calvarial abnormality. Sinuses/Orbits: Visualized paranasal sinuses and mastoids clear. Orbital soft tissues unremarkable. Other: None IMPRESSION: No acute intracranial abnormality. Electronically Signed   By: Rolm Baptise M.D.   On: 09/11/2018 23:53   Mr Cervical Spine Wo Contrast  Result Date: 09/12/2018 CLINICAL DATA:  Neck pain, initial exam Radiculopathy neck pain, weakness in legs, inability to walk. Intermittent neck pain. Personal history of Parkinson's. EXAM: MRI CERVICAL SPINE WITHOUT CONTRAST TECHNIQUE: Multiplanar, multisequence MR imaging of the cervical spine was performed. No intravenous contrast was administered. COMPARISON:  CT of the cervical spine 06/17/2010 FINDINGS: Alignment: Exaggerated cervical lordosis is present. There is slight anterolisthesis at C4-5 and C7-T1. No other significant listhesis is present in the cervical spine. Vertebrae: Mild endplate marrow changes are present at C5-6. Marrow signal and vertebral body heights are otherwise normal.  Skull base is normal. Cord: Normal signal is present in the cervical and upper thoracic spinal cord to the lowest imaged level, T3-4. Posterior Fossa, vertebral arteries, paraspinal tissues: Craniocervical junction is normal. Flow is present in the vertebral arteries bilaterally. Visualized intracranial contents are normal. Disc levels: C2-3: Negative. C3-4: Asymmetric right-sided facet hypertrophy is present. There is no significant stenosis. C4-5: There is mild facet hypertrophy bilaterally without significant stenosis. C5-6: A broad-based disc osteophyte complex is present. Bilateral facet hypertrophy is noted. Central canal is patent. Moderate foraminal narrowing is worse right than left due to uncovertebral disease. C6-7: Minimal left-sided uncovertebral spurring is present without significant stenosis. C7-T1: Negative. IMPRESSION: 1. No acute or focal lesion to explain inability to walk weakness in legs. 2. Chronic loss of disc height and endplate  degenerative change at C5-6 with moderate foraminal narrowing, right greater than left, secondary to uncovertebral and facet disease. 3. No significant central canal stenosis. 4. No other significant foraminal narrowing in the cervical spine. Electronically Signed   By: San Morelle M.D.   On: 09/12/2018 07:32   Mr Lumbar Spine Wo Contrast  Result Date: 09/12/2018 CLINICAL DATA:  Back pain, > 6wks conservative tx, persistent sx back pain, weakness of the legs, inability to walk. EXAM: MRI LUMBAR SPINE WITHOUT CONTRAST TECHNIQUE: Multiplanar, multisequence MR imaging of the lumbar spine was performed. No intravenous contrast was administered. COMPARISON:  None. FINDINGS: Segmentation: 5 non rib-bearing lumbar type vertebral bodies are present. The lowest fully formed vertebral body is L5. Alignment: Normal lumbar lordosis is present. There is no significant listhesis or scoliosis. Vertebrae:  Marrow signal and vertebral body heights are normal. Conus  medullaris and cauda equina: Conus extends to the T12 level. Conus and cauda equina appear normal. Paraspinal and other soft tissues: A 9 mm simple cyst is present in the medial aspect of the right kidney. A 3.5 cm right adnexal cyst is present. The urinary bladder is mildly distended. Disc levels: T12-L1: Negative. L1-2: Negative. L2-3: Mild facet hypertrophy is present bilaterally. There is some disc bulging. No significant stenosis is present. L3-4: Moderate facet hypertrophy is present. There is mild disc bulging without significant stenosis. L4-5: Mild facet hypertrophy is present bilaterally. There is no focal disc protrusion or stenosis. L5-S1: Mild facet hypertrophy is present bilaterally. There is no focal disc protrusion or stenosis. IMPRESSION: 1. Mild moderate facet hypertrophy in the lumbar spine without significant disc disease or focal stenosis to explain the patient's inability to walk or lower extremity weakness. 2. 3.5 cm right adnexal cyst. This lesion. Benign. Recommend follow-up ultrasound at 6-12 months for further evaluation. This recommendation follows ACR consensus guidelines: White Paper of the ACR Incidental Findings Committee II on Adnexal Findings. J Am Coll Radiol [phone]. Electronically Signed   By: San Morelle M.D.   On: 09/12/2018 08:00   Dg Chest Port 1 View  Result Date: 09/12/2018 CLINICAL DATA:  Weakness. EXAM: PORTABLE CHEST 1 VIEW COMPARISON:  09/17/2016 FINDINGS: Stable cardiomegaly. Unchanged mediastinal contours with aortic atherosclerosis. Biapical pleuroparenchymal scarring. No acute airspace disease, pulmonary edema, pleural fluid or pneumothorax. EKG lead overlies the chest. No acute osseous abnormalities are seen. IMPRESSION: Stable cardiomegaly without acute abnormality. Aortic Atherosclerosis (ICD10-I70.0). Electronically Signed   By: Keith Rake M.D.   On: 09/12/2018 03:24    Assessment/Plan Parkinson's disease ([place]) On Sinemet  QID With these spasm at night will add one more dose at 9 pm to see if it helps her ? Neurontin or Baclofen if this doesn't help Unfortunately she does not have appointment to see Neurology till Dec 16  Acute cystitis without hematuria Treated with Cipro Will start Pyridium to help with her Spasms Overactive bladder On Mybetriq Will wait before rescheduling her urology appointment  Persistent atrial fibrillation ([place]) Rate control on Cardizem Also on Xarelto Bilateral lower extremity edema Has responded to metolazone No dizziness BUN and creat stable Hyperlipidemia, Was taken off creator in the hospital due to high CK Will consider starting it in next few months  Unstable gait Working with therapy Can ambulate by herself in the room D/w DON     Family/ staff Communication:   Labs/tests ordered:  Total time spent in this patient care encounter was 45 _  minutes; greater than 50% of the visit spent counseling patient and  staff, reviewing records , Labs and coordinating care for problems addressed at this encounter.

## 2018-12-07 ENCOUNTER — Encounter: Payer: Self-pay | Admitting: Internal Medicine

## 2018-12-07 ENCOUNTER — Non-Acute Institutional Stay (SKILLED_NURSING_FACILITY): Payer: Medicare Other | Admitting: Internal Medicine

## 2018-12-07 DIAGNOSIS — R600 Localized edema: Secondary | ICD-10-CM

## 2018-12-07 DIAGNOSIS — M159 Polyosteoarthritis, unspecified: Secondary | ICD-10-CM | POA: Diagnosis not present

## 2018-12-07 DIAGNOSIS — G20 Parkinson's disease: Secondary | ICD-10-CM

## 2018-12-07 DIAGNOSIS — N3941 Urge incontinence: Secondary | ICD-10-CM

## 2018-12-07 DIAGNOSIS — I4819 Other persistent atrial fibrillation: Secondary | ICD-10-CM | POA: Diagnosis not present

## 2018-12-07 NOTE — Progress Notes (Signed)
Location:    [facility] Room Number: 13 Place of Service:  SNF (31) Provider:  [name], MD  [name], MD  Patient Care Team: [name], MD as PCP - General (Internal Medicine) [name], [name], MD as PCP - Cardiology (Cardiology) [name], [name], DO as Consulting Physician (Neurology) Mast, Man X, NP as Nurse Practitioner (Internal Medicine)  Extended Emergency Contact Information Primary Emergency Contact: [facility] Address: [address]          [place], [place] [address] [name] of [place] Phone: [phone] Mobile Phone: [phone] Relation: Son Secondary Emergency Contact: Tyjai, [place] Mobile Phone: [phone] Relation: Daughter Interpreter needed? No  Code Status:  DNR Goals of care: Advanced Directive information Advanced Directives 11/09/2018  Does Patient Have a Medical Advance Directive? Yes  Type of Advance Directive Out of facility DNR (pink MOST or yellow form)  Does patient want to make changes to medical advance directive? No - Patient declined  Copy of [place] in Chart? -  Would patient like information on creating a medical advance directive? -  Pre-existing out of facility DNR order (yellow form or pink MOST form) -     Chief Complaint  Patient presents with   Acute Visit    Follow up on cramps    HPI:  Pt is a 78 y.o. female seen today for an acute visit for on her Cramps, Urinary Incontinence and Shoulder Pain  Patient has a history of Parkinson disease diagnosed 2 years ago on Sinemet and follows with Dr. Enrigue Catena A. fib on Xarelto, hyperlipidemia, H/o Ovarian Cyst, Lower extremity edema,2 D echo with N EF Biatrial enlargementand Urinary Incontinence.  Was initially [place] the [place]22-08/25 for Recurrent Fall, UTI and Rhabdomyolysis Parkinson Disease Patient was started on extra sinemet at night to help with her Cramps. She is working with  therapy Her cramps did get better but then they came back last night She does not want me to change anything yet LE edema  Much better on Metolazone Urinary Incontinence C/O Increased Frequency at night C/O Shoulder Pain Right due to Wheelchair use   Past Medical History:  Diagnosis Date   Colles' fracture of right radius 03/05/2015   DEGENERATIVE JOINT DISEASE, RIGHT HIP 02/16/2007   Dupuytren's contracture    Osteoarthritis of hip    right   Osteoarthritis of left knee    Parkinson disease (HCC)    Paroxysmal A-fib ([place])    POSTMENOPAUSAL SYNDROME 02/16/2007   PREMATURE ATRIAL CONTRACTIONS 02/16/2007   S/P breast biopsy, left    two o'clock position - benign   Toxic effect of venom(989.5) 07/27/2007   Venous insufficiency    Past Surgical History:  Procedure Laterality Date   BREAST BIOPSY Left    CATARACT EXTRACTION, BILATERAL      Allergies  Allergen Reactions   Doxycycline Nausea And Vomiting   Sulfonamide Derivatives     REACTION: HIVES    Allergies as of 12/07/2018      Reactions   Doxycycline Nausea And Vomiting   Sulfonamide Derivatives    REACTION: HIVES      Medication List       Accurate as of December 07, 2018 11:20 AM. If you have any questions, ask your nurse or doctor.        Acetaminophen 500 MG coapsule Take 2 capsules by mouth. Take 2 tablets at bedtime. Every 8 Hours - PRN   calcium  carbonate 750 MG chewable tablet Commonly known as: TUMS EX Chew 1 tablet by mouth daily.   carbidopa-levodopa 25-100 MG tablet Commonly known as: SINEMET IR Take by mouth. Four Times A Day - 1.5 tab.  Once a day   diltiazem 120 MG 24 hr capsule Commonly known as: CARDIZEM CD Take 120 mg by mouth daily. HOLD IF SBP < 100   ferrous sulfate 325 (65 FE) MG tablet Take 325 mg by mouth. Once A Day on Mon, Fri   metolazone 2.5 MG tablet Commonly known as: ZAROXOLYN Take 2.5 mg by mouth. Once A Day on Mon, and Thurs. Hold if systolic blood  pressure less than 100   Myrbetriq 25 MG Tb24 tablet Generic drug: mirabegron ER Take 25 mg by mouth daily.   phenazopyridine 100 MG tablet Commonly known as: PYRIDIUM Take 100 mg by mouth 3 (three) times daily as needed for pain.   rivaroxaban 20 MG Tabs tablet Commonly known as: XARELTO Take 20 mg by mouth at bedtime.   Vitamin D3 10 MCG (400 UNIT) Caps Take 1 capsule by mouth daily.   zinc oxide 20 % ointment Apply 1 application topically as needed for irritation. To buttocks after every incontinent episode and as needed for redness. May keep at bedside.       Review of Systems  Constitutional: Negative.   HENT: Negative.   Respiratory: Negative.   Cardiovascular: Negative.   Gastrointestinal: Negative.   Genitourinary: Positive for frequency.  Musculoskeletal: Positive for myalgias and neck stiffness.  Neurological: Positive for weakness.  Psychiatric/Behavioral: Negative.     Immunization History  Administered Date(s) Administered   Influenza Split 10/20/2012   Influenza Whole 10/21/2006   Influenza, High Dose Seasonal PF 10/17/2016   Influenza-Unspecified 10/20/2013, 10/22/2017   Pneumococcal Conjugate-13 01/31/2013   Pneumococcal Polysaccharide-23 01/20/2005, 11/24/2011   Td 01/21/2004   Tdap 03/16/2014   Zoster 04/17/2009   Pertinent  Health Maintenance Due  Topic Date Due   INFLUENZA VACCINE  08/21/2018   COLONOSCOPY  06/12/2019   DEXA SCAN  Completed   PNA vac Low Risk Adult  Completed   Fall Risk  05/03/2018 11/03/2017 08/04/2017 03/06/2017 11/26/2016  Falls in the past year? 1 No No Yes Yes  Number falls in past yr: 0 - - 2 or more 2 or more  Injury with Fall? 0 - - No No  Risk Factor Category  - - - High Fall Risk High Fall Risk  Risk for fall due to : Other (Comment) - - - -  Follow up Falls evaluation completed;Education provided;Falls prevention discussed - - Falls evaluation completed Falls evaluation completed   Functional  Status Survey:    Vitals:   12/07/18 1108  BP: 108/72  Pulse: 83  Resp: 18  Temp: 98.6 F (37 C)  SpO2: 93%  Weight: 134 lb 1.6 oz (60.8 kg)  Height: 5\' 8"  (1.727 m)   Body mass index is 20.39 kg/m. Physical Exam Vitals signs reviewed.  Constitutional:      Appearance: Normal appearance.  HENT:     Head: Normocephalic.     Nose: Nose normal.     Mouth/Throat:     Mouth: Mucous membranes are moist.     Pharynx: Oropharynx is clear.  Eyes:     Pupils: Pupils are equal, round, and reactive to light.  Neck:     Musculoskeletal: Neck supple.  Cardiovascular:     Rate and Rhythm: Normal rate. Rhythm irregular.     Pulses: Normal  pulses.  Pulmonary:     Effort: Pulmonary effort is normal. No respiratory distress.     Breath sounds: Normal breath sounds. No wheezing.  Abdominal:     General: Abdomen is flat. Bowel sounds are normal.     Palpations: Abdomen is soft.  Musculoskeletal:        General: Swelling present.     Comments: Mild swelling Bilateral Has pain in right Shoulder with Movements  Skin:    General: Skin is warm and dry.  Neurological:     General: No focal deficit present.     Mental Status: She is alert and oriented to person, place, and time.     Comments: Mild stiffness inLE  Psychiatric:        Mood and Affect: Mood normal.        Thought Content: Thought content normal.        Judgment: Judgment normal.     Labs reviewed: Recent Labs    09/12/18 0304 09/13/18 0201 09/14/18 0043  10/07/18 10/28/18 11/18/18  NA 140 140 140   < > 143 142 144  K 3.6 3.8 3.9   < > 4.0 4.0 4.1  CL 108 112* 111   < > 108 104 110*  CO2 21* 22 23   < > 28 28 27*  GLUCOSE 89 95 109*  --   --   --   --   BUN 27* 24* 15   < > 20 19 21   CREATININE 0.88 0.75 0.68   < > 0.8 0.8 0.7  CALCIUM 9.2 8.4* 8.5*   < > 9.2 9.9 9.3  MG  --  2.0  --   --   --   --   --    < > = values in this interval not displayed.   Recent Labs    09/12/18 0304 09/13/18 0201 09/21/18    AST 60* 50* 20  ALT 7 7 20   ALKPHOS 45 36* 42  BILITOT 1.1 0.6  --   PROT 6.2* 4.9* 6.4  ALBUMIN 3.7 2.8* 3.9   Recent Labs    09/11/18 2105 09/12/18 0304 09/13/18 0201 09/21/18 10/07/18 10/14/18  WBC 10.6* 8.0 8.9 6.1 7.1 5.4  NEUTROABS 9.4*  --  7.1  --   --   --   HGB 12.6 11.5* 10.5* 11.8* 10.9* 10.1*  HCT 40.1 35.2* 32.4* 37 33* 31*  MCV 96.4 94.4 93.9  --   --   --   PLT 170 150 125* 210 172 184   Lab Results  Component Value Date   TSH 0.408 09/11/2018   No results found for: HGBA1C Lab Results  Component Value Date   CHOL 199 08/29/2015   HDL 88.20 08/29/2015   LDLCALC 103 (H) 08/29/2015   LDLDIRECT 104.2 01/25/2013   TRIG 42.0 08/29/2015   CHOLHDL 2 08/29/2015    Significant Diagnostic Results in last 30 days:  No results found.  Assessment/Plan Parkinson's disease ([place]) Will continue Same dose of Sinemet with extra dose at 9 om She has follow up with Neurology in Dec She does not want anything for her Cramps at this time  Osteoarthritis with Right Shoulder Pain Will continue Tylenol Refusing anything more for it Ortho referral as she wants Steroid Shots  Bilateral lower extremity edema Doing well on Metolazone Repeat BMP  Persistent atrial fibrillation (HCC) Rate controlled on Cardizem Also oN xarelto  Urge incontinence of urine On Myrbetriq Will try to give Diuretic early in  the morning to see if it helps with frequency at night Hyperlipidemia Check Lipid Panel Again Was taken off creator in the hospital due to high CK Unstable gait Working with therapy Can ambulate by herself in the room  Family/ staff Communication:   Labs/tests ordered:  CMP,Lipid Panel, CBC  Total time spent in this patient care encounter was  25_  minutes; greater than 50% of the visit spent counseling patient and staff, reviewing records , Labs and coordinating care for problems addressed at this encounter.

## 2018-12-09 LAB — HEPATIC FUNCTION PANEL
ALT: 10 (ref 7–35)
AST: 16 (ref 13–35)
Alkaline Phosphatase: 47 (ref 25–125)
Bilirubin, Total: 0.5

## 2018-12-09 LAB — CBC AND DIFFERENTIAL
HCT: 37 (ref 36–46)
Hemoglobin: 11.7 — AB (ref 12.0–16.0)
Neutrophils Absolute: 3718
Platelets: 189 (ref 150–399)
WBC: 5.5

## 2018-12-09 LAB — BASIC METABOLIC PANEL
BUN: 24 — AB (ref 4–21)
CO2: 29 — AB (ref 13–22)
Chloride: 103 (ref 99–108)
Creatinine: 0.7 (ref 0.5–1.1)
Glucose: 88
Potassium: 3.9 (ref 3.4–5.3)
Sodium: 140 (ref 137–147)

## 2018-12-09 LAB — COMPREHENSIVE METABOLIC PANEL
Albumin: 3.8 (ref 3.5–5.0)
Calcium: 9.4 (ref 8.7–10.7)
Globulin: 2.5

## 2018-12-09 LAB — LIPID PANEL
Cholesterol: 185 (ref 0–200)
HDL: 78 — AB (ref 35–70)
LDL Cholesterol: 94
LDl/HDL Ratio: 2.4
Triglycerides: 45 (ref 40–160)

## 2018-12-09 LAB — CBC: RBC: 3.87 (ref 3.87–5.11)

## 2018-12-13 ENCOUNTER — Encounter: Payer: Self-pay | Admitting: *Deleted

## 2018-12-19 ENCOUNTER — Encounter: Payer: Self-pay | Admitting: Internal Medicine

## 2018-12-20 ENCOUNTER — Non-Acute Institutional Stay (SKILLED_NURSING_FACILITY): Payer: Medicare Other | Admitting: Nurse Practitioner

## 2018-12-20 ENCOUNTER — Encounter: Payer: Self-pay | Admitting: Nurse Practitioner

## 2018-12-20 DIAGNOSIS — R300 Dysuria: Secondary | ICD-10-CM | POA: Diagnosis not present

## 2018-12-20 DIAGNOSIS — N3281 Overactive bladder: Secondary | ICD-10-CM | POA: Diagnosis not present

## 2018-12-20 DIAGNOSIS — M159 Polyosteoarthritis, unspecified: Secondary | ICD-10-CM

## 2018-12-20 DIAGNOSIS — I4819 Other persistent atrial fibrillation: Secondary | ICD-10-CM

## 2018-12-20 NOTE — Telephone Encounter (Signed)
Message routed to Gupta, Anjali L, MD  °

## 2018-12-20 NOTE — Assessment & Plan Note (Signed)
heart rate is in control, continue Xarelto 20mg  qd, Diltiazem 120mg  qd.

## 2018-12-20 NOTE — Assessment & Plan Note (Addendum)
Continue Myrbetriq 25mg  qd for urinary frequency/incontinence.

## 2018-12-20 NOTE — Assessment & Plan Note (Addendum)
Stable, continue Tylenol 1000mg  qhs, c/o lower back pain at night, not sure if its associated with ? Onset of UTI, better since Pyridium.

## 2018-12-20 NOTE — Progress Notes (Signed)
Location:   SNF [place] Room Number: 13 Place of Service:  SNF (31) Provider: [facility] Jamielee Mchale NP  [name], MD  Patient Care Team: [name], MD as PCP - General (Internal Medicine) [name], [name], MD as PCP - Cardiology (Cardiology) [name], [name], DO as Consulting Physician (Neurology) Roise Emert X, NP as Nurse Practitioner (Internal Medicine)  Extended Emergency Contact Information Primary Emergency Contact: [facility] Address: [address]          [place], [place] [address] [name] of [place] Phone: [phone] Mobile Phone: [phone] Relation: Son Secondary Emergency Contact: [place], [place] Mobile Phone: [phone] Relation: Daughter Interpreter needed? No  Code Status: DNR Goals of care: Advanced Directive information Advanced Directives 11/09/2018  Does Patient Have a Medical Advance Directive? Yes  Type of Advance Directive Out of facility DNR (pink MOST or yellow form)  Does patient want to make changes to medical advance directive? No - Patient declined  Copy of [place] in Chart? -  Would patient like information on creating a medical advance directive? -  Pre-existing out of facility DNR order (yellow form or pink MOST form) -     Chief Complaint  Patient presents with  . Acute Visit    dysuria, urinary frequency, lower abd/back pain at night, urinary incontience.     HPI:  Pt is a 78 y.o. female seen today for an acute visit for c/o dysuria, better since Pyridium 100mg  tid x 5 days started 12/18/18, pending UA C/S. The patient stated her lower abd/back at night, urinary frequency, urinary incontinence all present, but not sure of onset, duration, or intensity. She takes Myrbetriq 25mg  qd for urinary frequency. Hx of Parkinson's disease, ambulates with Bambi, moves slow, on Sinemet. Afib, heart rate is in control, on Xarelto 20mg  qd, Diltiazem 120mg  qd. Chronic multiple site osteoarthritis, stable on  Tylenol 1000mg  qhs    Past Medical History:  Diagnosis Date  . Colles' fracture of right radius 03/05/2015  . DEGENERATIVE JOINT DISEASE, RIGHT HIP 02/16/2007  . Dupuytren's contracture   . Osteoarthritis of hip    right  . Osteoarthritis of left knee   . Parkinson disease ([place])   . Paroxysmal A-fib ([place])   . POSTMENOPAUSAL SYNDROME 02/16/2007  . PREMATURE ATRIAL CONTRACTIONS 02/16/2007  . S/P breast biopsy, left    two o'clock position - benign  . 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 Venous insufficiency    Past Surgical History:  Procedure Laterality Date  . BREAST BIOPSY Left   . CATARACT EXTRACTION, BILATERAL      Allergies  Allergen Reactions  . Doxycycline Nausea And Vomiting  . Sulfonamide Derivatives     REACTION: HIVES    Allergies as of 12/20/2018      Reactions   Doxycycline Nausea And Vomiting   Sulfonamide Derivatives    REACTION: HIVES      Medication List       Accurate as of December 20, 2018  3:52 PM. If you have any questions, ask your nurse or doctor.        Acetaminophen 500 MG coapsule Take 2 capsules by mouth. Take 2 tablets at bedtime. Every 8 Hours - PRN   calcium carbonate 750 MG chewable tablet Commonly known as: TUMS EX Chew 1 tablet by mouth daily.   carbidopa-levodopa 25-100 MG tablet Commonly known as: SINEMET IR Take by mouth. Four Times A Day - 1.5 tab.  Once a day  diltiazem 120 MG 24 hr capsule Commonly known as: CARDIZEM CD Take 120 mg by mouth daily. HOLD IF SBP < 100   ferrous sulfate 325 (65 FE) MG tablet Take 325 mg by mouth. Once A Day on Mon, Fri   metolazone 2.5 MG tablet Commonly known as: ZAROXOLYN Take 2.5 mg by mouth. Once A Day on Mon, and Thurs. Hold if systolic blood pressure less than 100   Myrbetriq 25 MG Tb24 tablet Generic drug: mirabegron ER Take 25 mg by mouth daily.   phenazopyridine 100 MG tablet Commonly known as: PYRIDIUM Take 100 mg by mouth 3 (three) times daily as needed for  pain.   rivaroxaban 20 MG Tabs tablet Commonly known as: XARELTO Take 20 mg by mouth at bedtime.   Vitamin D3 10 MCG (400 UNIT) Caps Take 1 capsule by mouth daily.   zinc oxide 20 % ointment Apply 1 application topically as needed for irritation. To buttocks after every incontinent episode and as needed for redness. May keep at bedside.      ROS was provided with assistance of staff.  Review of Systems  Constitutional: Negative for activity change, appetite change, chills, diaphoresis and fever.  HENT: Positive for hearing loss. Negative for congestion and voice change.   Respiratory: Negative for cough, shortness of breath and wheezing.   Cardiovascular: Positive for leg swelling. Negative for chest pain and palpitations.  Gastrointestinal: Positive for abdominal pain. Negative for abdominal distention, constipation, diarrhea, nausea and vomiting.       Lower abd pain at night.   Genitourinary: Positive for dysuria and frequency. Negative for difficulty urinating, hematuria and urgency.       Incontinent of urine  Musculoskeletal: Positive for arthralgias, back pain, gait problem, myalgias, neck pain and neck stiffness.       Neck, lower back  Skin: Negative for color change.  Neurological: Negative for dizziness, speech difficulty, weakness and headaches.       Memory lapses. Moves slow  Psychiatric/Behavioral: Negative for agitation, behavioral problems and hallucinations. The patient is not nervous/anxious.     Immunization History  Administered Date(s) Administered  . Influenza Split 10/20/2012  . Influenza Whole 10/21/2006  . Influenza, High Dose Seasonal PF 10/17/2016  . Influenza-Unspecified 10/20/2013, 10/22/2017  . Pneumococcal Conjugate-13 01/31/2013  . Pneumococcal Polysaccharide-23 01/20/2005, 11/24/2011  . Td 01/21/2004  . Tdap 03/16/2014  . Zoster 04/17/2009   Pertinent  Health Maintenance Due  Topic Date Due  . INFLUENZA VACCINE  08/21/2018  .  COLONOSCOPY  06/12/2019  . DEXA SCAN  Completed  . PNA vac Low Risk Adult  Completed   Fall Risk  05/03/2018 11/03/2017 08/04/2017 03/06/2017 11/26/2016  Falls in the past year? 1 No No Yes Yes  Number falls in past yr: 0 - - 2 or more 2 or more  Injury with Fall? 0 - - No No  Risk Factor Category  - - - High Fall Risk High Fall Risk  Risk for fall due to : Other (Comment) - - - -  Follow up Falls evaluation completed;Education provided;Falls prevention discussed - - Falls evaluation completed Falls evaluation completed   Functional Status Survey:    Vitals:   12/20/18 1528  BP: 128/70  Pulse: 78  Resp: 18  Temp: (!) 97.5 F (36.4 C)  SpO2: 95%   There is no height or weight on file to calculate BMI. Physical Exam Vitals signs and nursing note reviewed.  Constitutional:      General: She is  not in acute distress.    Appearance: Normal appearance. She is not ill-appearing, toxic-appearing or diaphoretic.  HENT:     Head: Normocephalic and atraumatic.     Nose: Nose normal.     Mouth/Throat:     Mouth: Mucous membranes are moist.  Eyes:     Extraocular Movements: Extraocular movements intact.     Conjunctiva/sclera: Conjunctivae normal.     Pupils: Pupils are equal, round, and reactive to light.  Neck:     Musculoskeletal: Normal range of motion and neck supple. No neck rigidity or muscular tenderness.  Cardiovascular:     Rate and Rhythm: Normal rate. Rhythm irregular.     Heart sounds: No murmur.  Pulmonary:     Breath sounds: No wheezing or rhonchi.  Abdominal:     General: Bowel sounds are normal. There is no distension.     Palpations: Abdomen is soft.     Tenderness: There is no abdominal tenderness. There is no right CVA tenderness, left CVA tenderness, guarding or rebound.  Musculoskeletal:     Right lower leg: Edema present.     Left lower leg: Edema present.     Comments: Trace edema BLE  Skin:    General: Skin is warm and dry.  Neurological:     General:  No focal deficit present.     Mental Status: She is alert and oriented to person, place, and time. Mental status is at baseline.     Cranial Nerves: No cranial nerve deficit.     Motor: No weakness.     Coordination: Coordination abnormal.     Gait: Gait abnormal.     Comments: Moves slow  Psychiatric:        Mood and Affect: Mood normal.        Behavior: Behavior normal.        Thought Content: Thought content normal.     Labs reviewed: Recent Labs    09/12/18 0304 09/13/18 0201 09/14/18 0043  10/07/18 10/28/18 11/18/18  NA 140 140 140   < > 143 142 144  K 3.6 3.8 3.9   < > 4.0 4.0 4.1  CL 108 112* 111   < > 108 104 110*  CO2 21* 22 23   < > 28 28 27*  GLUCOSE 89 95 109*  --   --   --   --   BUN 27* 24* 15   < > 20 19 21   CREATININE 0.88 0.75 0.68   < > 0.8 0.8 0.7  CALCIUM 9.2 8.4* 8.5*   < > 9.2 9.9 9.3  MG  --  2.0  --   --   --   --   --    < > = values in this interval not displayed.   Recent Labs    09/12/18 0304 09/13/18 0201 09/21/18  AST 60* 50* 20  ALT 7 7 20   ALKPHOS 45 36* 42  BILITOT 1.1 0.6  --   PROT 6.2* 4.9* 6.4  ALBUMIN 3.7 2.8* 3.9   Recent Labs    09/11/18 2105 09/12/18 0304 09/13/18 0201 09/21/18 10/07/18 10/14/18  WBC 10.6* 8.0 8.9 6.1 7.1 5.4  NEUTROABS 9.4*  --  7.1  --   --   --   HGB 12.6 11.5* 10.5* 11.8* 10.9* 10.1*  HCT 40.1 35.2* 32.4* 37 33* 31*  MCV 96.4 94.4 93.9  --   --   --   PLT 170 150 125* 210 172 184  Lab Results  Component Value Date   TSH 0.408 09/11/2018   No results found for: HGBA1C Lab Results  Component Value Date   CHOL 199 08/29/2015   HDL 88.20 08/29/2015   LDLCALC 103 (H) 08/29/2015   LDLDIRECT 104.2 01/25/2013   TRIG 42.0 08/29/2015   CHOLHDL 2 08/29/2015    Significant Diagnostic Results in last 30 days:  No results found.  Assessment/Plan: Dysuria c/o dysuria in setting of lower abd/back pain,  better since Pyridium 100mg  tid x 5 days started 12/18/18, pending UA C/S.  Overactive  bladder Continue Myrbetriq 25mg  qd for urinary frequency/incontinence.   Atrial fibrillation (HCC) heart rate is in control, continue Xarelto 20mg  qd, Diltiazem 120mg  qd.   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 Onset of UTI, better since Pyridium.    Plan of care reviewed with the patient and charge nurse.   Labs/tests ordered:  Pending UA C/S  Time spend 25 minutes.

## 2018-12-20 NOTE — Assessment & Plan Note (Addendum)
c/o dysuria in setting of lower abd/back pain,  better since Pyridium 100mg  tid x 5 days started 12/18/18, pending UA C/S.

## 2018-12-21 NOTE — Progress Notes (Signed)
Virtual Visit via Video Note The purpose of this virtual visit is to provide medical care while limiting exposure to the novel coronavirus.    Consent was obtained for video visit:  Yes.   Answered questions that patient had about telehealth interaction:  Yes.   I discussed the limitations, risks, security and privacy concerns of performing an evaluation and management service by telemedicine. I also discussed with the patient that there may be a patient responsible charge related to this service. The patient expressed understanding and agreed to proceed.  Pt location: Home (nursing facility) Physician Location: office Name of referring provider:  [name], MD I connected with [name] at patients initiation/request on 12/23/2018 at  8:15 AM EST by video enabled telemedicine application and verified that I am speaking with the correct person using two identifiers. Pt MRN:  [id number] Pt DOB:  1940-03-10 Video Participants:  [name];     History of Present Illness:  Patient is seen today in follow-up for PD.   I have spoken with her primary care provider at the facility about her case since our last visit.  Daughter has also sent several emails that I have read and addressed.  Patient was on carbidopa/levodopa 25/100, 1.5 tablets 4 times per day (but facility not starting administration of meds until 9 am) when I last saw her but her PCP added an additional bedtime dose in mid November for nighttime "muscle spasm."  I have reviewed those records.  Pt states that she is still having cramping of the legs in the night.  It is often toward the AM.  Daughter is concerned about patient's mobility and the fact that she is now moving as well.  She is now in skilled nursing.  Pt denies falls.  Uses Keva to ambulate. Pt denies lightheadedness, near syncope.  No hallucinations but she does occasionally have nightmares.  Mood has been fair - somewhat depressed.  Last visit, the patient  was having lots of trouble with incontinence and was frustrated.  Her primary care physician did start her on Myrbetriq.  "its giving me a fit."  She states that she had a cath after urinating and she had 450 cc out.  She did see urology one time.  Since last visit, daughter requested that pt be seen a [facility].  She has an appt on 12/16.  Allergies  Allergen Reactions   Doxycycline Nausea And Vomiting   Sulfonamide Derivatives     REACTION: HIVES   Current Outpatient Medications on File Prior to Visit  Medication Sig Dispense Refill   Acetaminophen 500 MG coapsule Take 2 capsules by mouth. Take 2 tablets at bedtime. Every 8 Hours - PRN     calcium carbonate (TUMS EX) 750 MG chewable tablet Chew 1 tablet by mouth daily.     carbidopa-levodopa (SINEMET IR) 25-100 MG tablet Take by mouth. Four Times A Day - 1.5 tab.  Once a day     Cholecalciferol (VITAMIN D3) 10 MCG (400 UNIT) CAPS Take 1 capsule by mouth daily.     diltiazem (CARDIZEM CD) 120 MG 24 hr capsule Take 120 mg by mouth daily. HOLD IF SBP < 100     ferrous sulfate 325 (65 FE) MG tablet Take 325 mg by mouth. Once A Day on Mon, Fri     metolazone (ZAROXOLYN) 2.5 MG tablet Take 2.5 mg by mouth. Once A Day on Mon, and Thurs. Hold if systolic blood pressure less than 100  mirabegron ER (MYRBETRIQ) 25 MG TB24 tablet Take 25 mg by mouth daily.      phenazopyridine (PYRIDIUM) 100 MG tablet Take 100 mg by mouth 3 (three) times daily as needed for pain.     rivaroxaban (XARELTO) 20 MG TABS tablet Take 20 mg by mouth at bedtime.     zinc oxide 20 % ointment Apply 1 application topically as needed for irritation. To buttocks after every incontinent episode and as needed for redness. May keep at bedside.     No current facility-administered medications on file prior to visit.    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   HENT: Negative.   Eyes: Negative.   Cardiovascular: Negative.   Genitourinary: Positive for frequency and urgency.  Musculoskeletal: Positive for joint pain (R knee and shoulder (thinks that shoulder related to propelling WC)).  Skin: Negative.   Neurological: Positive for weakness.  Endo/Heme/Allergies: Negative.      Observations/Objective:   Vitals:   12/22/18 1314  BP: 122/60  Weight: 130 lb (59 kg)  Height: 5\' 8"  (1.727 m)   GEN:  The patient appears stated age and is in NAD.  Neurological examination:  Orientation: The patient is alert and oriented x3 (able to provide her detailed hx without any trouble, including giving me her meds and timing) Cranial nerves: There is good facial symmetry. There is very mild facial hypomimia.  The speech is fluent and clear. Soft palate rises symmetrically and there is no tongue deviation. Hearing is intact to conversational tone. Motor: Strength is at least antigravity x 4.   Shoulder shrug is equal and symmetric.  There is no pronator drift.  Movement examination: Tone: unable Abnormal movements: none noted Coordination:  There is no decremation, with any form of RAMS, including alternating supination and pronation of the forearm, hand opening and closing, finger taps,  toe taps bilaterally Gait and Station: she pushes off of her bed and arises without assistance.  She uses her Lavoris.  She is stooped.  She is slow and short stepped  Assessment and Plan:    1.  Parkinsons disease             -DaTscan was positive in September, 2018               -increase carbidopa/levodopa 25/100, 2 po qid (7am/11am/3pm/7pm)  -d/c bedtime carbidopa/levodopa 25/100 IR  -start carbidopa/levodopa 50/200 at bed (9pm)             -above orders sent to [facility]  2.  Mild anxiety and depression             -she probably needs some tx but changing other meds today.  Told her to discuss with PCP.  She agreed that wise not to change 2 different meds and will ask PCP in a few weeks.  No SI/HI  3.  Urinary incontinence  -she asks me about that today but she has  a urologist and told her to follow up with urology  -on myrbetriq    Follow Up Instructions:  Pt has a NP appt at [facility] and will be following up there for continued care.  She and I discussed that today.  Pt agreeable to above and tx plan   -I discussed the assessment and treatment plan with the patient. The patient was provided an opportunity to ask questions and all were answered. The patient agreed with the plan and demonstrated an understanding of the instructions.   The patient was advised to call back or seek an in-person evaluation if the symptoms worsen or if the condition fails to improve as anticipated.  Much greater than 50% of this visit was spent in counseling and coordinating care.  Total time:  25 min    Jakub Debold, DO

## 2018-12-22 ENCOUNTER — Encounter: Payer: Self-pay | Admitting: Neurology

## 2018-12-23 ENCOUNTER — Other Ambulatory Visit: Payer: Self-pay

## 2018-12-23 ENCOUNTER — Telehealth (INDEPENDENT_AMBULATORY_CARE_PROVIDER_SITE_OTHER): Payer: Medicare Other | Admitting: Neurology

## 2018-12-23 ENCOUNTER — Encounter: Payer: Self-pay | Admitting: Internal Medicine

## 2018-12-23 ENCOUNTER — Telehealth: Payer: Self-pay

## 2018-12-23 VITALS — BP 122/60 | Ht 68.0 in | Wt 130.0 lb

## 2018-12-23 DIAGNOSIS — G20 Parkinson's disease: Secondary | ICD-10-CM

## 2018-12-23 DIAGNOSIS — F330 Major depressive disorder, recurrent, mild: Secondary | ICD-10-CM | POA: Diagnosis not present

## 2018-12-23 DIAGNOSIS — R32 Unspecified urinary incontinence: Secondary | ICD-10-CM | POA: Diagnosis not present

## 2018-12-23 NOTE — Telephone Encounter (Signed)
Faxed to [phone]. Per receptionist it will get to the patients nurse and she is in the "[facility]" branch of the [facility] buildings.

## 2018-12-23 NOTE — Telephone Encounter (Signed)
-----   Message from Vilas, DO sent at 12/23/2018  8:28 AM EST ----- Please send the following orders to [facility] for the patient:  1.  increase carbidopa/levodopa 25/100, 2 po qid (7am/11am/3pm/7pm) 2.  d/c bedtime carbidopa/levodopa 25/100 IR 3  start carbidopa/levodopa CR, 1 tablet 50/200 at bed (9pm)

## 2018-12-28 ENCOUNTER — Non-Acute Institutional Stay (SKILLED_NURSING_FACILITY): Payer: Medicare Other | Admitting: Internal Medicine

## 2018-12-28 ENCOUNTER — Encounter: Payer: Self-pay | Admitting: Internal Medicine

## 2018-12-28 DIAGNOSIS — G20 Parkinson's disease: Secondary | ICD-10-CM

## 2018-12-28 DIAGNOSIS — R600 Localized edema: Secondary | ICD-10-CM

## 2018-12-28 DIAGNOSIS — E785 Hyperlipidemia, unspecified: Secondary | ICD-10-CM

## 2018-12-28 DIAGNOSIS — N3281 Overactive bladder: Secondary | ICD-10-CM | POA: Diagnosis not present

## 2018-12-28 DIAGNOSIS — I4819 Other persistent atrial fibrillation: Secondary | ICD-10-CM

## 2018-12-28 DIAGNOSIS — G20A1 Parkinson's disease without dyskinesia, without mention of fluctuations: Secondary | ICD-10-CM

## 2018-12-28 NOTE — Progress Notes (Signed)
Location:   [place] Room Number: 13 Place of Service:  SNF [phone]) Provider:  [name], MD  [name], MD  Patient Care Team: [name], MD as PCP - General (Internal Medicine) [name], [name], MD as PCP - Cardiology (Cardiology) [name], [name], DO as Consulting Physician (Neurology) Mast, Man X, NP as Nurse Practitioner (Internal Medicine)  Extended Emergency Contact Information Primary Emergency Contact: [facility] Address: [address]          [place], [place] [address] [name] of [place] Phone: [phone] Mobile Phone: [phone] Relation: Son Secondary Emergency Contact: Aubryn, [place] Mobile Phone: [phone] Relation: Daughter Interpreter needed? No  Code Status:  DNR Goals of care: Advanced Directive information Advanced Directives 11/09/2018  Does Patient Have a Medical Advance Directive? Yes  Type of Advance Directive Out of facility DNR (pink MOST or yellow form)  Does patient want to make changes to medical advance directive? No - Patient declined  Copy of [place] in Chart? -  Would patient like information on creating a medical advance directive? -  Pre-existing out of facility DNR order (yellow form or pink MOST form) -     Chief Complaint  Patient presents with  . Acute Visit    Follow Up    HPI:  Pt is a 78 y.o. female seen today for an acute visit for Follow up of her Multiple Issues  Patient has a history of Parkinson disease diagnosed 2 years ago on Sinemet and follows with Dr. Enrigue Catena A. fib on Xarelto, hyperlipidemia, H/o Ovarian Cyst, Lower extremity edema,2 D echo with N EF Biatrial enlargementand Urinary Incontinence.  [place] for Recurrent Fall, UTI and Rhabdomyolysis Parkinson Disease Was seen by Dr. Carles Collet recently who increased her Sinemet to 2 tablets 4 times a day and added extended  release at night to help with the cramps Patient said that her only problem right now is working with the nurses to get it at the right time. Otherwise she is doing better with her transfers and is working with therapy Lower extremity edema Continues to do well on metolazone Urinary incontinence She is on Myrbetriq.  She goes  to the bathroom number of times and Not  able to empty her bladder.  She does have follow-up appointment with urologist Right shoulder pain hip pain and knee pain Have made a referral to her orthopedics  Past Medical History:  Diagnosis Date  . Colles' fracture of right radius 03/05/2015  . DEGENERATIVE JOINT DISEASE, RIGHT HIP 02/16/2007  . Dupuytren's contracture   . Osteoarthritis of hip    right  . Osteoarthritis of left knee   . Parkinson disease ([place])   . Paroxysmal A-fib ([place])   . POSTMENOPAUSAL SYNDROME 02/16/2007  . PREMATURE ATRIAL CONTRACTIONS 02/16/2007  . S/P breast biopsy, left    two o'clock position - benign  . 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 Venous insufficiency    Past Surgical History:  Procedure Laterality Date  . BREAST BIOPSY Left   . CATARACT EXTRACTION, BILATERAL      Allergies  Allergen Reactions  . Doxycycline Nausea And Vomiting  . Sulfonamide Derivatives     REACTION: HIVES    Allergies as of 12/28/2018      Reactions   Doxycycline Nausea And Vomiting   Sulfonamide Derivatives    REACTION: HIVES      Medication List  Accurate as of December 28, 2018  9:49 AM. If you have any questions, ask your nurse or doctor.        STOP taking these medications   phenazopyridine 100 MG tablet Commonly known as: PYRIDIUM Stopped by: [name], MD     TAKE these medications   Acetaminophen 500 MG coapsule Take 2 capsules by mouth. Take 2 tablets at bedtime. Every 8 Hours - PRN   calcium carbonate 750 MG chewable tablet Commonly known as: TUMS EX Chew 1 tablet by mouth daily.   carbidopa-levodopa 25-100  MG tablet Commonly known as: SINEMET IR Take 1 tablet by mouth. At Bedtime   carbidopa-levodopa 25-100 MG tablet Commonly known as: SINEMET IR Take 1 tablet by mouth 4 (four) times daily.   Clinpro 5000 1.1 % Pste Generic drug: Sodium Fluoride Place onto teeth at bedtime.   diltiazem 120 MG 24 hr capsule Commonly known as: CARDIZEM CD Take 120 mg by mouth daily. HOLD IF SBP < 100   ferrous sulfate 325 (65 FE) MG tablet Take 325 mg by mouth. Once A Day on Mon, Fri   metolazone 2.5 MG tablet Commonly known as: ZAROXOLYN Take 2.5 mg by mouth. Once A Day on Mon, and Thurs. Hold if systolic blood pressure less than 100   Myrbetriq 25 MG Tb24 tablet Generic drug: mirabegron ER Take 25 mg by mouth daily.   rivaroxaban 20 MG Tabs tablet Commonly known as: XARELTO Take 20 mg by mouth at bedtime.   Vitamin D3 10 MCG (400 UNIT) Caps Take 1 capsule by mouth daily.   zinc oxide 20 % ointment Apply 1 application topically as needed for irritation. To buttocks after every incontinent episode and as needed for redness. May keep at bedside.       Review of Systems  ROS negative except as per Presenting Complain  Immunization History  Administered Date(s) Administered  . Influenza Split 10/20/2012  . Influenza Whole 10/21/2006  . Influenza, High Dose Seasonal PF 10/17/2016  . Influenza-Unspecified 10/20/2013, 10/22/2017  . Pneumococcal Conjugate-13 01/31/2013  . Pneumococcal Polysaccharide-23 01/20/2005, 11/24/2011  . Td 01/21/2004  . Tdap 03/16/2014  . Zoster 04/17/2009   Pertinent  Health Maintenance Due  Topic Date Due  . INFLUENZA VACCINE  08/21/2018  . COLONOSCOPY  06/12/2019  . DEXA SCAN  Completed  . PNA vac Low Risk Adult  Completed   Fall Risk  05/03/2018 11/03/2017 08/04/2017 03/06/2017 11/26/2016  Falls in the past year? 1 No No Yes Yes  Number falls in past yr: 0 - - 2 or more 2 or more  Injury with Fall? 0 - - No No  Risk Factor Category  - - - High Fall Risk  High Fall Risk  Risk for fall due to : Other (Comment) - - - -  Follow up Falls evaluation completed;Education provided;Falls prevention discussed - - Falls evaluation completed Falls evaluation completed   Functional Status Survey:    Vitals:   12/28/18 0940  BP: 114/70  Pulse: 92  Resp: 16  Temp: (!) 97.1 F (36.2 C)  SpO2: 92%  Weight: 127 lb 6.4 oz (57.8 kg)  Height: 5\' 8"  (1.727 m)   Body mass index is 19.37 kg/m. Physical Exam  Constitutional: Oriented to person, place, and time. Well-developed and well-nourished.  HENT:  Head: Normocephalic.  Mouth/Throat: Oropharynx is clear and moist.  Eyes: Pupils are equal, round, and reactive to light.  Neck: Neck supple.  Cardiovascular: Normal rate and normal heart sounds.  No murmur heard. Pulmonary/Chest: Effort normal and breath sounds normal. No respiratory distress. No wheezes. She has no rales.  Abdominal: Soft. Bowel sounds are normal. No distension. There is no tenderness. There is no rebound.  Musculoskeletal: Mild swelling Bilateral Lymphadenopathy: none Neurological: Alert and oriented to person, place, and time. Continues to have Mild Stiffness in LE Skin: Skin is warm and dry.  Psychiatric: Normal mood and affect. Behavior is normal. Thought content normal.    Labs reviewed: Recent Labs    09/12/18 0304 09/13/18 0201 09/14/18 0043  10/28/18 11/18/18 12/09/18  NA 140 140 140   < > 142 144 140  K 3.6 3.8 3.9   < > 4.0 4.1 3.9  CL 108 112* 111   < > 104 110* 103  CO2 21* 22 23   < > 28 27* 29*  GLUCOSE 89 95 109*  --   --   --   --   BUN 27* 24* 15   < > 19 21 24*  CREATININE 0.88 0.75 0.68   < > 0.8 0.7 0.7  CALCIUM 9.2 8.4* 8.5*   < > 9.9 9.3 9.4  MG  --  2.0  --   --   --   --   --    < > = values in this interval not displayed.   Recent Labs    09/12/18 0304 09/13/18 0201 09/21/18 12/09/18  AST 60* 50* 20 16  ALT 7 7 20 10   ALKPHOS 45 36* 42 47  BILITOT 1.1 0.6  --   --   PROT 6.2* 4.9* 6.4   --   ALBUMIN 3.7 2.8* 3.9 3.8   Recent Labs    09/11/18 2105 09/12/18 0304 09/13/18 0201  10/07/18 10/14/18 12/09/18  WBC 10.6* 8.0 8.9   < > 7.1 5.4 5.5  NEUTROABS 9.4*  --  7.1  --   --   --  3,718  HGB 12.6 11.5* 10.5*   < > 10.9* 10.1* 11.7*  HCT 40.1 35.2* 32.4*   < > 33* 31* 37  MCV 96.4 94.4 93.9  --   --   --   --   PLT 170 150 125*   < > 172 184 189   < > = values in this interval not displayed.   Lab Results  Component Value Date   TSH 0.408 09/11/2018   No results found for: HGBA1C Lab Results  Component Value Date   CHOL 185 12/09/2018   HDL 78 (A) 12/09/2018   LDLCALC 94 12/09/2018   LDLDIRECT 104.2 01/25/2013   TRIG 45 12/09/2018   CHOLHDL 2 08/29/2015    Significant Diagnostic Results in last 30 days:  No results found.  Assessment/Plan  Parkinson's disease ([place]) Dr. Carles Collet increased her Sinemet to continue.  4 times daily and added extended release at night Patient is going to follow-up with Dr. Rexene Alberts for another opinion  Osteoarthritis with Right Shoulder Pain On Tylenol as needed Referral made to her Ortho for possible steroid shots  Bilateral lower extremity edema Doing well on Metolazone BUN and creatinine are stable  Persistent atrial fibrillation ([place]) Rate controlled on Cardizem Also oN xarelto  Urge incontinence of urine On Myrbetriq Referral made to urology Hyperlipidemia LDL 94 Off her statin right now due to [place] with her falls  Reconsider later Unstable gait Working with therapy Can ambulate by herself in the room and Do her transfers Anemia On Iron Hgb stable Colonoscopy done in 2016  Family/ staff Communication:    Labs/tests ordered:

## 2018-12-29 ENCOUNTER — Non-Acute Institutional Stay (SKILLED_NURSING_FACILITY): Payer: Medicare Other | Admitting: Nurse Practitioner

## 2018-12-29 ENCOUNTER — Encounter: Payer: Self-pay | Admitting: Nurse Practitioner

## 2018-12-29 DIAGNOSIS — N3281 Overactive bladder: Secondary | ICD-10-CM

## 2018-12-29 DIAGNOSIS — I959 Hypotension, unspecified: Secondary | ICD-10-CM

## 2018-12-29 DIAGNOSIS — I4819 Other persistent atrial fibrillation: Secondary | ICD-10-CM

## 2018-12-29 DIAGNOSIS — D649 Anemia, unspecified: Secondary | ICD-10-CM

## 2018-12-29 DIAGNOSIS — I8393 Asymptomatic varicose veins of bilateral lower extremities: Secondary | ICD-10-CM

## 2018-12-29 DIAGNOSIS — G20 Parkinson's disease: Secondary | ICD-10-CM

## 2018-12-29 DIAGNOSIS — M159 Polyosteoarthritis, unspecified: Secondary | ICD-10-CM

## 2018-12-29 NOTE — Assessment & Plan Note (Signed)
on Fe, Hgb 11.7 12/09/18

## 2018-12-29 NOTE — Assessment & Plan Note (Signed)
Moves slow, resides in [facility] for safety, care assistance, ambulates with Ellisha, continue Sinemet

## 2018-12-29 NOTE — Progress Notes (Signed)
Location:   SNF [place] Room Number: 13 Place of Service:  SNF (31) Provider:  Jayziah Bankhead NP  [name], MD  Patient Care Team: [name], MD as PCP - General (Internal Medicine) [name], [name], MD as PCP - Cardiology (Cardiology) [name], [name], DO as Consulting Physician (Neurology) Luqman Perrelli X, NP as Nurse Practitioner (Internal Medicine)  Extended Emergency Contact Information Primary Emergency Contact: [facility] Address: [address]          [place], [place] [address] [name] of [place] Phone: [phone] Mobile Phone: [phone] Relation: Son Secondary Emergency Contact: Laikyn, Mcphaul Mobile Phone: [phone] Relation: Daughter Interpreter needed? No  Code Status:  DNR Goals of care: Advanced Directive information Advanced Directives 11/09/2018  Does Patient Have a Medical Advance Directive? Yes  Type of Advance Directive Out of facility DNR (pink MOST or yellow form)  Does patient want to make changes to medical advance directive? No - Patient declined  Copy of [place] in Chart? -  Would patient like information on creating a medical advance directive? -  Pre-existing out of facility DNR order (yellow form or pink MOST form) -     Chief Complaint  Patient presents with  . Medical Management of Chronic Issues    HPI:  Pt is a 78 y.o. female seen today for medical management of chronic diseases.    The patient resides in SNF [facility] for safety,care assistance, ambulates with Eliya, slow movement, Hx of Parkinson's disease, taking Sinemet 25/100mg  I qhs, II qid. Generalized OA, taking Tylenol 1000mg  qhs. Urinary frequency, nocturnal urination 1-2/night, on Myrbetriq 15mg  qd. Afib, heart rate is in control, on Xarelto 20mg  qd, Diltiazem 120mg  qd. BLE edema, on Metolazone 2.5mg  2x/wk, wt fluctuating #127-134Ibs. HTN, blood pressure is controlled. Anemia, on Fe, Hgb 11.7 12/09/18   Past Medical History:   Diagnosis Date  . Colles' fracture of right radius 03/05/2015  . DEGENERATIVE JOINT DISEASE, RIGHT HIP 02/16/2007  . Dupuytren's contracture   . Osteoarthritis of hip    right  . Osteoarthritis of left knee   . Parkinson disease ([place])   . Paroxysmal A-fib ([place])   . POSTMENOPAUSAL SYNDROME 02/16/2007  . PREMATURE ATRIAL CONTRACTIONS 02/16/2007  . S/P breast biopsy, left    two o'clock position - benign  . 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 Venous insufficiency    Past Surgical History:  Procedure Laterality Date  . BREAST BIOPSY Left   . CATARACT EXTRACTION, BILATERAL      Allergies  Allergen Reactions  . Doxycycline Nausea And Vomiting  . Sulfonamide Derivatives     REACTION: HIVES    Allergies as of 12/29/2018      Reactions   Doxycycline Nausea And Vomiting   Sulfonamide Derivatives    REACTION: HIVES      Medication List       Accurate as of December 29, 2018  2:43 PM. If you have any questions, ask your nurse or doctor.        Acetaminophen 500 MG coapsule Take 2 capsules by mouth. Take 2 tablets at bedtime. Every 8 Hours - PRN   calcium carbonate 750 MG chewable tablet Commonly known as: TUMS EX Chew 1 tablet by mouth daily.   carbidopa-levodopa 25-100 MG tablet Commonly known as: SINEMET IR Take 1 tablet by mouth. At Bedtime   carbidopa-levodopa 25-100 MG tablet Commonly known as: SINEMET IR Take 2 tablets by mouth 4 (four) times daily.  Clinpro 5000 1.1 % Pste Generic drug: Sodium Fluoride Place onto teeth at bedtime.   diltiazem 120 MG 24 hr capsule Commonly known as: CARDIZEM CD Take 120 mg by mouth daily. HOLD IF SBP < 100   ferrous sulfate 325 (65 FE) MG tablet Take 325 mg by mouth. Once A Day on Mon, Fri   metolazone 2.5 MG tablet Commonly known as: ZAROXOLYN Take 2.5 mg by mouth. Once A Day on Mon, and Thurs. Hold if systolic blood pressure less than 100   Myrbetriq 25 MG Tb24 tablet Generic drug: mirabegron ER Take 25 mg by  mouth daily.   rivaroxaban 20 MG Tabs tablet Commonly known as: XARELTO Take 20 mg by mouth at bedtime.   Vitamin D3 10 MCG (400 UNIT) Caps Take 1 capsule by mouth daily.   zinc oxide 20 % ointment Apply 1 application topically as needed for irritation. To buttocks after every incontinent episode and as needed for redness. May keep at bedside.       Review of Systems  Constitutional: Negative for activity change, appetite change, chills, diaphoresis, fatigue and fever.  HENT: Negative for congestion and voice change.   Respiratory: Negative for cough, shortness of breath and wheezing.   Cardiovascular: Positive for leg swelling. Negative for chest pain and palpitations.  Gastrointestinal: Negative for abdominal distention, abdominal pain, constipation, diarrhea, nausea and vomiting.  Genitourinary: Negative for difficulty urinating, dysuria and urgency.  Musculoskeletal: Positive for arthralgias, back pain and gait problem.  Skin: Negative for color change and pallor.  Neurological: Positive for tremors. Negative for dizziness, speech difficulty, weakness and headaches.  Psychiatric/Behavioral: Negative for agitation, behavioral problems, hallucinations and sleep disturbance. The patient is not nervous/anxious.     Immunization History  Administered Date(s) Administered  . Influenza Split 10/20/2012  . Influenza Whole 10/21/2006  . Influenza, High Dose Seasonal PF 10/17/2016  . Influenza-Unspecified 10/20/2013, 10/22/2017  . Pneumococcal Conjugate-13 01/31/2013  . Pneumococcal Polysaccharide-23 01/20/2005, 11/24/2011  . Td 01/21/2004  . Tdap 03/16/2014  . Zoster 04/17/2009   Pertinent  Health Maintenance Due  Topic Date Due  . COLONOSCOPY  06/12/2019  . INFLUENZA VACCINE  Completed  . DEXA SCAN  Completed  . PNA vac Low Risk Adult  Completed   Fall Risk  05/03/2018 11/03/2017 08/04/2017 03/06/2017 11/26/2016  Falls in the past year? 1 No No Yes Yes  Number falls in past  yr: 0 - - 2 or more 2 or more  Injury with Fall? 0 - - No No  Risk Factor Category  - - - High Fall Risk High Fall Risk  Risk for fall due to : Other (Comment) - - - -  Follow up Falls evaluation completed;Education provided;Falls prevention discussed - - Falls evaluation completed Falls evaluation completed   Functional Status Survey:    Vitals:   12/29/18 0840  BP: 118/64  Pulse: 81  Resp: 16  Temp: 97.7 F (36.5 C)  SpO2: 98%  Weight: 127 lb 6.4 oz (57.8 kg)  Height: 5\' 8"  (1.727 m)   Body mass index is 19.37 kg/m. Physical Exam Vitals signs and nursing note reviewed.  Constitutional:      General: She is not in acute distress.    Appearance: Normal appearance. She is not ill-appearing or toxic-appearing.  HENT:     Head: Normocephalic and atraumatic.     Nose: Nose normal.     Mouth/Throat:     Mouth: Mucous membranes are moist.  Eyes:     Extraocular  Movements: Extraocular movements intact.     Conjunctiva/sclera: Conjunctivae normal.     Pupils: Pupils are equal, round, and reactive to light.  Neck:     Musculoskeletal: Normal range of motion and neck supple.  Cardiovascular:     Rate and Rhythm: Normal rate. Rhythm irregular.     Heart sounds: No murmur.  Pulmonary:     Breath sounds: No wheezing or rales.  Abdominal:     General: Bowel sounds are normal. There is no distension.     Palpations: Abdomen is soft.     Tenderness: There is no abdominal tenderness. There is no right CVA tenderness, left CVA tenderness, guarding or rebound.  Musculoskeletal:     Right lower leg: Edema present.     Left lower leg: Edema present.     Comments: 1+ edema BLE  Skin:    General: Skin is warm and dry.  Neurological:     General: No focal deficit present.     Mental Status: She is alert and oriented to person, place, and time. Mental status is at baseline.     Motor: No weakness.     Coordination: Coordination abnormal.     Gait: Gait abnormal.     Comments: Moves  slow, ambulates with Yaneli.   Psychiatric:        Mood and Affect: Mood normal.        Behavior: Behavior normal.        Thought Content: Thought content normal.        Judgment: Judgment normal.     Labs reviewed: Recent Labs    09/12/18 0304 09/13/18 0201 09/14/18 0043  10/28/18 11/18/18 12/09/18  NA 140 140 140   < > 142 144 140  K 3.6 3.8 3.9   < > 4.0 4.1 3.9  CL 108 112* 111   < > 104 110* 103  CO2 21* 22 23   < > 28 27* 29*  GLUCOSE 89 95 109*  --   --   --   --   BUN 27* 24* 15   < > 19 21 24*  CREATININE 0.88 0.75 0.68   < > 0.8 0.7 0.7  CALCIUM 9.2 8.4* 8.5*   < > 9.9 9.3 9.4  MG  --  2.0  --   --   --   --   --    < > = values in this interval not displayed.   Recent Labs    09/12/18 0304 09/13/18 0201 09/21/18 12/09/18  AST 60* 50* 20 16  ALT 7 7 20 10   ALKPHOS 45 36* 42 47  BILITOT 1.1 0.6  --   --   PROT 6.2* 4.9* 6.4  --   ALBUMIN 3.7 2.8* 3.9 3.8   Recent Labs    09/11/18 2105 09/12/18 0304 09/13/18 0201  10/07/18 10/14/18 12/09/18  WBC 10.6* 8.0 8.9   < > 7.1 5.4 5.5  NEUTROABS 9.4*  --  7.1  --   --   --  3,718  HGB 12.6 11.5* 10.5*   < > 10.9* 10.1* 11.7*  HCT 40.1 35.2* 32.4*   < > 33* 31* 37  MCV 96.4 94.4 93.9  --   --   --   --   PLT 170 150 125*   < > 172 184 189   < > = values in this interval not displayed.   Lab Results  Component Value Date   TSH 0.408 09/11/2018  No results found for: HGBA1C Lab Results  Component Value Date   CHOL 185 12/09/2018   HDL 78 (A) 12/09/2018   LDLCALC 94 12/09/2018   LDLDIRECT 104.2 01/25/2013   TRIG 45 12/09/2018   CHOLHDL 2 08/29/2015    Significant Diagnostic Results in last 30 days:  No results found.  Assessment/Plan Varicose veins of both lower extremities Persisted, wt varies, continue Metolazone 2.5mg  qd 2x/wk.   Hypotension Blood pressure is controlled, continue Diltiazem.   Atrial fibrillation (HCC) Heart rate is in control, continue Diltiazem, Xarelto  Parkinsonism  (HCC) Moves slow, resides in SN[facility] for safety, care assistance, ambulates with C[place], continue Sinemet  Osteoarthritis involving multiple joints on both sides of body Pain is controlled, continue Tylenol nightly.   Overactive bladder Stable, 1-2x/night bathroom trips, continue Myrbetriq  Anemia on Fe, Hgb 11.7 12/09/18      Family/ staff Communication: plan of care reviewed with the patient and charge nurse.   Labs/tests ordered:  none  Time spend 25 minutes.

## 2018-12-29 NOTE — Assessment & Plan Note (Signed)
Pain is controlled, continue Tylenol nightly.

## 2018-12-29 NOTE — Assessment & Plan Note (Signed)
Persisted, wt varies, continue Metolazone 2.5mg  qd 2x/wk.

## 2018-12-29 NOTE — Assessment & Plan Note (Signed)
Blood pressure is controlled, continue Diltiazem.  

## 2018-12-29 NOTE — Assessment & Plan Note (Signed)
Heart rate is in control, continue Diltiazem, Xarelto

## 2018-12-29 NOTE — Assessment & Plan Note (Signed)
Stable, 1-2x/night bathroom trips, continue Myrbetriq

## 2019-01-04 ENCOUNTER — Telehealth: Payer: Self-pay

## 2019-01-04 NOTE — Telephone Encounter (Signed)
I called pt, spoke to pt's daughter Baldo Ash, per [place]. I advised pt's daughter that Dr. Rexene Alberts will be out of the office tomorrow. She asked for the soonest available appt. I will put her on the waitlist for now and call her when I have something open. Pt's daughter verbalized understanding.

## 2019-01-05 ENCOUNTER — Ambulatory Visit: Payer: Medicare Other | Admitting: Neurology

## 2019-01-05 ENCOUNTER — Encounter

## 2019-01-05 NOTE — Telephone Encounter (Signed)
I called pt's daughter, Stephanie Shaw, to offer pt an appt on 01/12/19 at 1:00pm with Dr. Rexene Alberts. If pt's back, please discuss this appt with her.

## 2019-01-06 NOTE — Telephone Encounter (Signed)
I called pt, spoke with pt's daughter, r/s pt's appt for 01/12/19 at 10:30am, check in at 10:00am. Pt's daughter verbalized understanding of new appt date and time.

## 2019-01-07 ENCOUNTER — Ambulatory Visit: Payer: Medicare Other | Admitting: Cardiovascular Disease

## 2019-01-12 ENCOUNTER — Other Ambulatory Visit: Payer: Self-pay

## 2019-01-12 ENCOUNTER — Encounter: Payer: Self-pay | Admitting: Neurology

## 2019-01-12 ENCOUNTER — Ambulatory Visit: Payer: Medicare Other | Admitting: Neurology

## 2019-01-12 VITALS — BP 98/59 | HR 80 | Temp 97.2°F

## 2019-01-12 DIAGNOSIS — K5909 Other constipation: Secondary | ICD-10-CM

## 2019-01-12 DIAGNOSIS — I959 Hypotension, unspecified: Secondary | ICD-10-CM | POA: Diagnosis not present

## 2019-01-12 DIAGNOSIS — R32 Unspecified urinary incontinence: Secondary | ICD-10-CM

## 2019-01-12 DIAGNOSIS — G20 Parkinson's disease: Secondary | ICD-10-CM | POA: Diagnosis not present

## 2019-01-12 NOTE — Patient Instructions (Signed)
You have advanced Parkinson's disease with a left-sided more than right-sided findings.  Unfortunately, this disease does progress over time as you know.  You are at fall risk.  Please use your [place] at all times and continue physical therapy.  I do believe that your current medication regimen as suggested most recently by Dr. Carles Collet is adequate.  Please continue with your Sinemet 2 pills 4 times a day at 7, 11, 3 PM and 7 PM, also with your long-acting Sinemet 50-200 mg strength at bedtime around 9 PM.  If you have sleep difficulties, you can talk to your primary care physician about adding melatonin which is an over-the-counter sleep enhancing supplements.  I would recommend starting with 1 mg and up to 5 mg if needed, I will recommend this to Dr. Lyndel Safe as well.  Please also continue to be very proactive about constipation issues, try not to go more than 2 days, ideally no more than 1 day in between having bowel movements.  Dr. Lyndel Safe may need to add an as needed laxative such as MiraLAX.  Please try to hydrate well with water.  Please follow-up routinely to see one of our nurse practitioners in 3 to 4 months.

## 2019-01-12 NOTE — Progress Notes (Signed)
Subjective:    Patient ID: Stephanie Shaw is a 78 y.o. female.  HPI    Star Age, MD, PhD [facility] [address], [address] [place], [place] [address]  Dear Dr. Lyndel Safe,   I saw your patient, Stephanie Shaw, upon your kind request in the neurologic clinic today for initial consultation of her parkinsonism.  The patient is accompanied by a caretaker from her SNF today.  As you know, Stephanie Shaw is a 87 year old right-handed woman with an underlying medical history of paroxysmal A. fib, osteoarthritis, chronic venous insufficiency and Dupuytren's contracture, who was diagnosed with Parkinson's disease in 2018.  She believes she has had symptoms for several years, does recall that she had left-sided symptoms in the beginning.  Over time, she has had more stiffness, decline in mobility, constipation, low blood pressure issues, and has fallen.  [facility] at [facility] nursing.  She fell in the summer and feels that she had a more steeper decline since the summer. She has been followed by Dr. Carles Collet at [facility] neurology, had a visit on 09/24/2018.  She was on Sinemet 1.5 pills 4 times a day, 25-100 mg strength.  She was hospitalized after several falls in August 2020.  She was diagnosed with rhabdomyolysis.  She went to rehab after that.  She had an abnormal DaTscan on 09/25/2016 and I reviewed the results: IMPRESSION: Poor uptake of dopamine transport specific radiotracer within the LEFT and RIGHT putamen and decrease relative uptake in the head of the RIGHT caudate nucleus is in pattern typical of Parkinson's syndrome.  She also had a telemedicine visit with Dr. Carles Collet on 12/23/2018 which I reviewed: Her Sinemet was changed to 2 pills 4 times a day and she was advised to start Sinemet CR 50-200 mg strength at bedtime.  She was advised to follow-up with her primary care physician regarding anxiety and depression symptoms and to follow-up with urology regarding  her urinary incontinence.   She reports that she has an appointment with urology.  She reports constipation and indicates that she has a bowel movement typically every other day, sometimes every third day.  In reviewing her MAR, I do not see that she has any as needed medication for stool softener or laxative.  She denies any hallucinations, she does have difficulty maintaining sleep.  She indicates nocturia about once or twice per average night which is better since the summer.  She also was treated for a UTI in the summer.  She reports no significant difficulty falling asleep but she does wake up in the middle of the night and has trouble going back to sleep.  She feels that the increase in the Sinemet and the addition of the long-acting Sinemet has been helpful.  She has physical therapy at her skilled nursing facility.  She usually is able to walk with her Lillien, she indicates that she has a 2 wheeled Alyss which she did not bring today, she is in a wheelchair.  She denies a family history of Parkinson's disease with the exception of one paternal cousin that had Parkinson's disease.  Her Past Medical History Is Significant For: Past Medical History:  Diagnosis Date  . Colles' fracture of right radius 03/05/2015  . DEGENERATIVE JOINT DISEASE, RIGHT HIP 02/16/2007  . Dupuytren's contracture   . Osteoarthritis of hip    right  . Osteoarthritis of left knee   . Parkinson disease ([place])   . Paroxysmal A-fib ([place])   .  POSTMENOPAUSAL SYNDROME 02/16/2007  . PREMATURE ATRIAL CONTRACTIONS 02/16/2007  . S/P breast biopsy, left    two o'clock position - benign  . 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 Venous insufficiency     Her Past Surgical History Is Significant For: Past Surgical History:  Procedure Laterality Date  . BREAST BIOPSY Left   . CATARACT EXTRACTION, BILATERAL      Her Family History Is Significant For: Family History  Problem Relation Age of Onset  . Cancer Father        ?  type  . Alcohol abuse Father   . Cancer Mother        Pancreatic  . Diabetes Mother   . Heart failure Mother   . Osteoarthritis Sister   . Healthy Sister   . Healthy Child   . Breast cancer Neg Hx     Her Social History Is Significant For: Social History   Socioeconomic History  . Marital status: Divorced    Spouse name: Not on file  . Number of children: 2  . Years of education: Not on file  . Highest education level: Master's degree (e.g., MA, MS, MEng, MEd, MSW, MBA)  Occupational History  . Occupation: retired    Comment: math, [employer]  Tobacco Use  . Smoking status: Former Smoker    Quit date: 08/27/1971    Years since quitting: 47.4  . Smokeless tobacco: Never Used  Substance and Sexual Activity  . Alcohol use: No    Alcohol/week: 0.0 standard drinks  . Drug use: No  . Sexual activity: Not Currently    Comment: 1st intercourse 78 yo-Fewer than 5 partners  Other Topics Concern  . Not on file  Social History Narrative  . Not on file   Social Determinants of Health   Financial Resource Strain:   . Difficulty of Paying Living Expenses: Not on file  Food Insecurity:   . Worried About Charity fundraiser in the Last Year: Not on file  . Ran Out of Food in the Last Year: Not on file  Transportation Needs:   . Lack of Transportation (Medical): Not on file  . Lack of Transportation (Non-Medical): Not on file  Physical Activity:   . Days of Exercise per Week: Not on file  . Minutes of Exercise per Session: Not on file  Stress:   . Feeling of Stress : Not on file  Social Connections:   . Frequency of Communication with Shaw and Family: Not on file  . Frequency of Social Gatherings with Shaw and Family: Not on file  . Attends Religious Services: Not on file  . Active Member of Clubs or Organizations: Not on file  . Attends Archivist Meetings: Not on file  . Marital Status: Not on file    Her Allergies Are:  Allergies  Allergen Reactions  .  Doxycycline Nausea And Vomiting  . Sulfonamide Derivatives     REACTION: HIVES  :   Her Current Medications Are:  Outpatient Encounter Medications as of 01/12/2019  Medication Sig  . Acetaminophen 500 MG coapsule Take 2 capsules by mouth. Take 2 tablets at bedtime. Every 8 Hours - PRN  . calcium carbonate (TUMS EX) 750 MG chewable tablet Chew 1 tablet by mouth daily.  . carbidopa-levodopa (SINEMET CR) 50-200 MG tablet Take 1 tablet by mouth at bedtime.  . carbidopa-levodopa (SINEMET IR) 25-100 MG tablet Take 2 tablets by mouth 4 (four) times daily.   . Cholecalciferol (VITAMIN D3) 10  MCG (400 UNIT) CAPS Take 1 capsule by mouth daily.  Marland Kitchen diltiazem (CARDIZEM CD) 120 MG 24 hr capsule Take 120 mg by mouth daily. HOLD IF SBP < 100  . ferrous sulfate 325 (65 FE) MG tablet Take 325 mg by mouth. Once A Day on Mon, Fri  . metolazone (ZAROXOLYN) 2.5 MG tablet Take 2.5 mg by mouth. Once A Day on Mon, and Thurs. Hold if systolic blood pressure less than 100  . mirabegron ER (MYRBETRIQ) 25 MG TB24 tablet Take 25 mg by mouth daily.   . rivaroxaban (XARELTO) 20 MG TABS tablet Take 20 mg by mouth at bedtime.  . Sodium Fluoride (CLINPRO 5000) 1.1 % PSTE Place onto teeth at bedtime.  Marland Kitchen zinc oxide 20 % ointment Apply 1 application topically as needed for irritation. To buttocks after every incontinent episode and as needed for redness. May keep at bedside.  . [DISCONTINUED] carbidopa-levodopa (SINEMET IR) 25-100 MG tablet Take 1 tablet by mouth. At Bedtime   No facility-administered encounter medications on file as of 01/12/2019.  :   Review of Systems:  Out of a complete 14 point review of systems, all are reviewed and negative with the exception of these symptoms as listed below:  Review of Systems  Neurological:       Pt sts she is here for parkinson's evaluation. Pt has trouble standing in wheelchair today. Latest fall was in the Summer. Lives at [facility].    Objective:  Neurological  Exam  Physical Exam Physical Examination:   Vitals:   01/12/19 1008  BP: (!) 98/59  Pulse: 80  Temp: (!) 97.2 F (36.2 C)    General Examination: The patient is a very pleasant 78 y.o. female in no acute distress. She is situated in her wheelchair. She did not bring her Carletha. She is accompanied by her caretaker.  HEENT: Normocephalic, atraumatic, pupils are equal, round and reactive to light, Extraocular tracking shows saccadic breakdown and mild decrease in upgaze, she has moderate facial masking, mild excess moisture in the mouth but no frank drooling. She has significant hypophonia, slight dysarthria, no obvious lip, neck or jaw tremor, moderate to severe nuchal rigidity.   Chest: is clear to auscultation without wheezing, rhonchi or crackles noted.  Heart: sounds are irregularly irregular.   Abdomen: is soft, non-tender and non-distended with normal bowel sounds appreciated on auscultation.  Extremities: There is 2-3+ pitting edema in the distal lower extremities with chronic changes noted in the skin. She is not wearing any compression socks or stockings.  Skin: is warm and dry with no trophic changes noted. Age-related changes are noted on the skin.   Musculoskeletal: exam reveals Arthritic changes in both hands. She also reports right hip pain.  Neurologically:  Mental status: The patient is awake and alert, paying good  attention. She is Able to provide her history and relate her symptoms well but has trouble with chronological events and details of her history and symptomatology. Her memory appears to be at least mildly impaired. She has no obvious mood related issues, does not appear to be overly anxious. She has no obvious hallucinations. Some degree of bradyphrenia.  Cranial nerves are as described above under HEENT exam. Shoulder and upper body position is stooped. On 01/12/2019: On Archimedes spiral drawing, she has insecurity with both the right and the left hand,  left hand a little worse which is her nondominant hand. She has no obvious resting tremor, no obvious action or postural tremor, no  intention tremor today. She has on fine motor skills testing a moderate degree of difficulty with hand movements, finger taps and rapid alternating padding on the right, perhaps even moderate to severe, more noticeable on the left, overall lateralization to the left. Tone is increased in the extremities and the mild degree, moderate in the left upper extremity with cogwheeling noted. On foot agility and foot taps, she has moderate difficulty with the right. She has moderate to severe difficulty with the left. Strength is overall 4 out of 5 but she does have pain in the right hip And pain related decrease in right hip flexor strength. As she did not bring her Dominika today and no gait belt, I did not feel it was safe for her to stand or walk for me. Cerebellar testing shows no dysmetria or intention tremor.   Sensory exam is intact to light touch in the upper and lower extremities.   Assessment and Plan:   In summary, JYA BOGACKI is a very pleasant 78 y.o.-year old female with an underlying medical history of paroxysmal A. fib, osteoarthritis, chronic venous insufficiency and Dupuytren's contracture, who Presents for evaluation of her Parkinson's disease.  Her history and examination are in keeping with left-sided predominant Parkinson's disease.  She has some advanced findings.  She has a fairly progressive clinical decline from what I can see.  She has had some associated memory loss, some associated mood related changes including depression and anxiety as well as associated constipation.  Currently she is on Sinemet 2 pills 4 times daily and this was increased recently earlier this month by Dr. Carles Collet at the last visit.She was also started on Sinemet CR generic 50-200 mg strength at bedtime.  I believe this regimen is good and adequate.  We have to be mindful of potential side  effects of this medication including low blood pressure.  She has already lower blood pressure values.  She has fallen.  She is at fall risk.  Gait and posture were not safe to evaluate today as she did not have a Thomasene or gait belt.She has a bowel movement every other day.  She is Reminded to be proactive about constipation issues.  Perhaps you could consider the addition of a stool softener or fiber supplement or laxative as needed such as MiraLAX.  She has had some trouble maintaining sleep.  While I would not recommend a prescription sleep aid, you could potentially consider melatonin starting as low as 1 mg and up to maybe 5 mg at night, although it is typically more helpful for sleep initiation difficulties which she currently does not have. She had a DaTscan in 2018 which was supportive of her diagnosis.  She is advised to follow-up with one of our nurse practitioners in 3 to 4 months and continue with the current medication regimen.  We talked about the importance of fall prevention.  She is advised to use her Tabathia at all times and continue with her physical therapy at her skilled nursing facility. I answered all her questions today and the patient was in agreement with the plan. Thank you very much for allowing me to participate in the care of this nice patient. If I can be of any further assistance to you please do not hesitate to call me at [phone].  Sincerely,   Star Age, MD, PhD

## 2019-01-13 ENCOUNTER — Non-Acute Institutional Stay (SKILLED_NURSING_FACILITY): Payer: Medicare Other | Admitting: Internal Medicine

## 2019-01-13 ENCOUNTER — Encounter: Payer: Self-pay | Admitting: Internal Medicine

## 2019-01-13 DIAGNOSIS — I959 Hypotension, unspecified: Secondary | ICD-10-CM | POA: Diagnosis not present

## 2019-01-13 DIAGNOSIS — R600 Localized edema: Secondary | ICD-10-CM

## 2019-01-13 DIAGNOSIS — G20 Parkinson's disease: Secondary | ICD-10-CM

## 2019-01-13 DIAGNOSIS — G20C Parkinsonism, unspecified: Secondary | ICD-10-CM

## 2019-01-13 DIAGNOSIS — I4819 Other persistent atrial fibrillation: Secondary | ICD-10-CM | POA: Diagnosis not present

## 2019-01-13 DIAGNOSIS — N3281 Overactive bladder: Secondary | ICD-10-CM

## 2019-01-13 NOTE — Progress Notes (Signed)
Location:    [place] Room Number: 13 Place of Service:  SNF [phone]) Provider: [name], MD  [name], MD  Patient Care Team: [name], MD as PCP - General (Internal Medicine) [name], [name], MD as PCP - Cardiology (Cardiology) [name], [name], DO as Consulting Physician (Neurology) Mast, Man X, NP as Nurse Practitioner (Internal Medicine)  Extended Emergency Contact Information Primary Emergency Contact: [facility] Address: [address]          [place], [place] [address] [name] of [place] Phone: [phone] Mobile Phone: [phone] Relation: Son Secondary Emergency Contact: Adana, Pedro Mobile Phone: [phone] Relation: Daughter Interpreter needed? No  Code Status:  DNR Goals of care: Advanced Directive information Advanced Directives 11/09/2018  Does Patient Have a Medical Advance Directive? Yes  Type of Advance Directive Out of facility DNR (pink MOST or yellow form)  Does patient want to make changes to medical advance directive? No - Patient declined  Copy of [place] in Chart? -  Would patient like information on creating a medical advance directive? -  Pre-existing out of facility DNR order (yellow form or pink MOST form) -     Chief Complaint  Patient presents with  . Acute Visit    Follow Up    HPI:  Pt is a 78 y.o. female seen today for an acute visit for Follow up   Patient has a history of Parkinson disease diagnosed 2 years ago on Sinemet and follows with Neurology, paroxysmal A. fib on Xarelto, hyperlipidemia, H/o Ovarian Cyst, Lower extremity edema,2 D echo with N EF Biatrial enlargementand Urinary Incontinence.  Parkinson disease Her Sinemet was recently increased and ER was added at night to help her Cramps It is helping her to sleep better at night LE edema Slightly more  Does tolerate Metolazone Urinary Incontinence Continues  to be problem especially  in early morning Atrial Fibrillation Nurses seems to be holding her Cardizem due to some low BP readings Constipation Worsening recently  Past Medical History:  Diagnosis Date  . Colles' fracture of right radius 03/05/2015  . DEGENERATIVE JOINT DISEASE, RIGHT HIP 02/16/2007  . Dupuytren's contracture   . Osteoarthritis of hip    right  . Osteoarthritis of left knee   . Parkinson disease ([place])   . Paroxysmal A-fib ([place])   . POSTMENOPAUSAL SYNDROME 02/16/2007  . PREMATURE ATRIAL CONTRACTIONS 02/16/2007  . S/P breast biopsy, left    two o'clock position - benign  . 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 Venous insufficiency    Past Surgical History:  Procedure Laterality Date  . BREAST BIOPSY Left   . CATARACT EXTRACTION, BILATERAL      Allergies  Allergen Reactions  . Doxycycline Nausea And Vomiting  . Sulfonamide Derivatives     REACTION: HIVES    Allergies as of 01/13/2019      Reactions   Doxycycline Nausea And Vomiting   Sulfonamide Derivatives    REACTION: HIVES      Medication List       Accurate as of January 13, 2019 10:13 AM. If you have any questions, ask your nurse or doctor.        Acetaminophen 500 MG coapsule Take 2 capsules by mouth. Take 2 tablets at bedtime. Every 8 Hours - PRN   calcium carbonate 750 MG chewable tablet Commonly known as: TUMS EX Chew 1 tablet by mouth daily.   carbidopa-levodopa 25-100 MG tablet  Commonly known as: SINEMET IR Take 2 tablets by mouth 4 (four) times daily.   carbidopa-levodopa 50-200 MG tablet Commonly known as: SINEMET CR Take 1 tablet by mouth at bedtime.   Clinpro 5000 1.1 % Pste Generic drug: Sodium Fluoride Place onto teeth at bedtime.   diltiazem 120 MG 24 hr capsule Commonly known as: CARDIZEM CD Take 120 mg by mouth daily. HOLD IF SBP < 100   ferrous sulfate 325 (65 FE) MG tablet Take 325 mg by mouth. Once A Day on Mon, Fri   metolazone 2.5 MG tablet Commonly known as: ZAROXOLYN Take  2.5 mg by mouth. Once A Day on Mon, and Thurs. Hold if systolic blood pressure less than 100   Myrbetriq 25 MG Tb24 tablet Generic drug: mirabegron ER Take 25 mg by mouth daily.   rivaroxaban 20 MG Tabs tablet Commonly known as: XARELTO Take 20 mg by mouth at bedtime.   Vitamin D3 10 MCG (400 UNIT) Caps Take 1 capsule by mouth daily.   zinc oxide 20 % ointment Apply 1 application topically as needed for irritation. To buttocks after every incontinent episode and as needed for redness. May keep at bedside.       Review of Systems  Constitutional: Negative.   HENT: Negative.   Respiratory: Negative.   Cardiovascular: Positive for leg swelling.  Gastrointestinal: Positive for constipation.  Genitourinary: Positive for frequency and urgency.  Musculoskeletal: Positive for arthralgias.  Neurological: Positive for weakness.  Psychiatric/Behavioral: Positive for sleep disturbance.  All other systems reviewed and are negative.   Immunization History  Administered Date(s) Administered  . Influenza Split 10/20/2012  . Influenza Whole 10/21/2006  . Influenza, High Dose Seasonal PF 10/17/2016  . Influenza-Unspecified 10/20/2013, 10/22/2017  . Pneumococcal Conjugate-13 01/31/2013  . Pneumococcal Polysaccharide-23 01/20/2005, 11/24/2011  . Td 01/21/2004  . Tdap 03/16/2014  . Zoster 04/17/2009   Pertinent  Health Maintenance Due  Topic Date Due  . COLONOSCOPY  06/12/2019  . INFLUENZA VACCINE  Completed  . DEXA SCAN  Completed  . PNA vac Low Risk Adult  Completed   Fall Risk  05/03/2018 11/03/2017 08/04/2017 03/06/2017 11/26/2016  Falls in the past year? 1 No No Yes Yes  Number falls in past yr: 0 - - 2 or more 2 or more  Injury with Fall? 0 - - No No  Risk Factor Category  - - - High Fall Risk High Fall Risk  Risk for fall due to : Other (Comment) - - - -  Follow up Falls evaluation completed;Education provided;Falls prevention discussed - - Falls evaluation completed Falls  evaluation completed   Functional Status Survey:    Vitals:   01/13/19 1009  BP: 122/62  Pulse: 80  Resp: 16  Temp: (!) 97.1 F (36.2 C)  SpO2: 92%  Weight: 135 lb 8 oz (61.5 kg)  Height: 5\' 8"  (1.727 m)   Body mass index is 20.6 kg/m. Physical Exam  Constitutional: Oriented to person, place, and time. Well-developed and well-nourished.  HENT:  Head: Normocephalic.  Mouth/Throat: Oropharynx is clear and moist.  Eyes: Pupils are equal, round, and reactive to light.  Neck: Neck supple.  Cardiovascular: Normal rate and normal heart sounds.  No murmur heard. Pulmonary/Chest: Effort normal and breath sounds normal. No respiratory distress. No wheezes. She has no rales.  Abdominal: Soft. Bowel sounds are normal. No distension. There is no tenderness. There is no rebound.  Musculoskeletal: Moderate Edema Bilateral Lymphadenopathy: none Neurological: Alert and oriented to person, place, and  time.  Skin: Skin is warm and dry.  Psychiatric: Normal mood and affect. Behavior is normal. Thought content normal.    Labs reviewed: Recent Labs    09/12/18 0304 09/13/18 0201 09/13/18 0201 09/14/18 0043 10/28/18 0000 11/18/18 0000 12/09/18 0000  NA 140 140   < > 140 142 144 140  K 3.6 3.8  --  3.9 4.0 4.1 3.9  CL 108 112*   < > 111 104 110* 103  CO2 21* 22   < > 23 28 27* 29*  GLUCOSE 89 95  --  109*  --   --   --   BUN 27* 24*   < > 15 19 21  24*  CREATININE 0.88 0.75   < > 0.68 0.8 0.7 0.7  CALCIUM 9.2 8.4*   < > 8.5* 9.9 9.3 9.4  MG  --  2.0  --   --   --   --   --    < > = values in this interval not displayed.   Recent Labs    09/12/18 0304 09/13/18 0201 09/21/18 0000 12/09/18 0000  AST 60* 50* 20 16  ALT 7 7 20 10   ALKPHOS 45 36* 42 47  BILITOT 1.1 0.6  --   --   PROT 6.2* 4.9* 6.4  --   ALBUMIN 3.7 2.8* 3.9 3.8   Recent Labs    09/11/18 2105 09/12/18 0304 09/12/18 0304 09/13/18 0201 10/07/18 0000 10/14/18 0000 12/09/18 0000  WBC 10.6* 8.0   < > 8.9  7.1 5.4 5.5  NEUTROABS 9.4*  --   --  7.1  --   --  3,718  HGB 12.6 11.5*  --  10.5* 10.9* 10.1* 11.7*  HCT 40.1 35.2*  --  32.4* 33* 31* 37  MCV 96.4 94.4  --  93.9  --   --   --   PLT 170 150  --  125* 172 184 189   < > = values in this interval not displayed.   Lab Results  Component Value Date   TSH 0.408 09/11/2018   No results found for: HGBA1C Lab Results  Component Value Date   CHOL 185 12/09/2018   HDL 78 (A) 12/09/2018   LDLCALC 94 12/09/2018   LDLDIRECT 104.2 01/25/2013   TRIG 45 12/09/2018   CHOLHDL 2 08/29/2015    Significant Diagnostic Results in last 30 days:  No results found.  Assessment/Plan  Parkinson's disease ([place]) NO Change in therapy per Dr Rexene Alberts Continue on Sinemet Working with therapy  Osteoarthritiswith Right Shoulder Pain Has appointment with Ortho for possible Shots Bilateral lower extremity edema Has Gained weight with Worsening Edema Little hesitant to increase the dose of Metolazone due to her incontinence and low BP Will reval in few weeks  Persistent atrial fibrillation ([place]) Rate Control on Cardizem but it seems nurses are holding due to Low BP The Parameters were changed  Continue on Xarelto Urge incontinence of urine On Myrbetriq Has Appointment with Urology for follow up Constipation Started on Senna Plus Hyperlipidemia LDL 94 Off her statin right now due to Rhabdo with her falls  Reconsider later Unstable gait Working with therapy Can ambulate by herself in the room and Do her transfers Anemia On Iron Hgb stable Colonoscopy done in 2016  Family/ staff Communication:   Labs/tests ordered:   Total time spent in this patient care encounter was  25_  minutes; greater than 50% of the visit spent counseling patient and staff, reviewing records ,  Labs and coordinating care for problems addressed at this encounter.

## 2019-01-17 ENCOUNTER — Encounter: Payer: Self-pay | Admitting: *Deleted

## 2019-01-19 ENCOUNTER — Encounter: Payer: Self-pay | Admitting: Nurse Practitioner

## 2019-01-19 ENCOUNTER — Non-Acute Institutional Stay (SKILLED_NURSING_FACILITY): Payer: Medicare Other | Admitting: Nurse Practitioner

## 2019-01-19 DIAGNOSIS — I4819 Other persistent atrial fibrillation: Secondary | ICD-10-CM | POA: Diagnosis not present

## 2019-01-19 DIAGNOSIS — M159 Polyosteoarthritis, unspecified: Secondary | ICD-10-CM

## 2019-01-19 DIAGNOSIS — G20 Parkinson's disease: Secondary | ICD-10-CM

## 2019-01-19 DIAGNOSIS — I8393 Asymptomatic varicose veins of bilateral lower extremities: Secondary | ICD-10-CM

## 2019-01-19 DIAGNOSIS — R600 Localized edema: Secondary | ICD-10-CM

## 2019-01-19 DIAGNOSIS — N3281 Overactive bladder: Secondary | ICD-10-CM

## 2019-01-19 DIAGNOSIS — K5900 Constipation, unspecified: Secondary | ICD-10-CM

## 2019-01-19 NOTE — Assessment & Plan Note (Signed)
Stable, continue Senokot S

## 2019-01-19 NOTE — Assessment & Plan Note (Signed)
Chronic, continue Metolazone.

## 2019-01-19 NOTE — Assessment & Plan Note (Addendum)
C/o Right should pain, pending Ortho. Continue Tylenol.

## 2019-01-19 NOTE — Progress Notes (Signed)
Location:   [facility] Room Number: 13 Place of Service:  SNF (31) Provider:  Burlene Montecalvo NP  [name], MD  Patient Care Team: [name], MD as PCP - General (Internal Medicine) [name], [name], MD as PCP - Cardiology (Cardiology) [name], [name], DO as Consulting Physician (Neurology) Yerachmiel Spinney X, NP as Nurse Practitioner (Internal Medicine)  Extended Emergency Contact Information Primary Emergency Contact: [facility] Address: [address]          [place], [place] [address] [name] of [place] Phone: [phone] Mobile Phone: [phone] Relation: Son Secondary Emergency Contact: Bitania, [place] Mobile Phone: [phone] Relation: Daughter Interpreter needed? No  Code Status:  DNR Goals of care: Advanced Directive information Advanced Directives 11/09/2018  Does Patient Have a Medical Advance Directive? Yes  Type of Advance Directive Out of facility DNR (pink MOST or yellow form)  Does patient want to make changes to medical advance directive? No - Patient declined  Copy of [place] in Chart? -  Would patient like information on creating a medical advance directive? -  Pre-existing out of facility DNR order (yellow form or pink MOST form) -     Chief Complaint  Patient presents with  . Medical Management of Chronic Issues    HPI:  Pt is a 78 y.o. female seen today for medical management of chronic diseases.    The patient resides in [facility] for safety, care assistance, ambulate with Jaiyanna, w/c to go further. Urinary frequency is chronic. Edema BLE, on Metolazone,  normal EF on echo. Hx of Parkinson's disease, diagnosed about 2 years ago, f/u Neurology, on Sinemet. Afib, heart rate is in control, on Diltiazem, Xarelto. Constipation, better since Senokot S. OA, c/o right shoulder pain, pending Ortho, taking Tylenol.    Past Medical History:  Diagnosis Date  . Acute lower UTI 09/14/2018  . Anemia  10/15/2018   2016 colonoscopy 10/14/18 wbc 5.4, Hgb 10.1, plt 184 12/01/18 wbc 4.5, Hgb 11.1, plt 188, neutrophils 63,  Na 139, K 3.7, Bun 23, creat 0.69, eGFR 83 on Fe, Hgb 11.7 12/09/18   . Atrial fibrillation ([place]) 05/21/2010   10/07/18 Na 143, K 4.0, Bun 20, creat 0.79, eGFR 72, wbc 7.1, Hgb 10.9, plt 172 10/28/18 Na 142, K 4.0, Bun 19, creat 0.77, eGFR 74 11/18/18 Na 144, K 4.1, Bun 21, creat 0.69, eGFR 83  . Bilateral lower extremity edema 07/19/2018   11/02/18 BMP 2 weeks.   . Cervical pain (neck) 06/20/2010  . Closed fracture of part of upper end of humerus 05/01/2015  . Colles' fracture of right radius 03/05/2015  . Constipation 07/19/2018  . DEGENERATIVE JOINT DISEASE, RIGHT HIP 02/16/2007  . Dupuytren's contracture   . Dysuria 09/20/2018   09/20/18 c/o got up several times last night to go urinate, burning on urination, lower abd /back discomfort, but urinary frequency, leakage are not new. UA C/S, Pyridium 129m tid x 2 days.  09/21/18 wbc 6.1, Hgb 11.8, plt 210, neutrophil 69.4, Na 142, K 4.1, Bun 19, creat 0.78, TP 6.4, albumin 3.9  . Hematuria 02/04/2014  . Hypotension 09/18/2018  . Long term (current) use of anticoagulants 05/29/2016  . Osteoarthritis of hip    right  . Osteoarthritis of left knee   . Overactive bladder 10/06/2018  . Parkinson disease ([place]   . Paroxysmal A-fib ([place]   . POSTMENOPAUSAL SYNDROME 02/16/2007  . PREMATURE ATRIAL CONTRACTIONS 02/16/2007  . Primary osteoarthritis of right  shoulder 04/27/2017  . S/P breast biopsy, left    two o'clock position - benign  . 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 Tremor, unspecified 10/19/2015  . Unstable gait 02/16/2017  . Vaginal atrophy 10/19/2015  . Venous insufficiency   . Weakness 09/12/2018   Past Surgical History:  Procedure Laterality Date  . BREAST BIOPSY Left   . CATARACT EXTRACTION, BILATERAL      Allergies  Allergen Reactions  . Doxycycline Nausea And Vomiting  . Sulfonamide Derivatives     REACTION: HIVES     Allergies as of 01/19/2019      Reactions   Doxycycline Nausea And Vomiting   Sulfonamide Derivatives    REACTION: HIVES      Medication List       Accurate as of January 19, 2019  3:36 PM. If you have any questions, ask your nurse or doctor.        Acetaminophen 500 MG coapsule Take 2 capsules by mouth. Take 2 tablets at bedtime. Every 8 Hours - PRN   calcium carbonate 750 MG chewable tablet Commonly known as: TUMS EX Chew 1 tablet by mouth daily.   carbidopa-levodopa 25-100 MG tablet Commonly known as: SINEMET IR Take 2 tablets by mouth 4 (four) times daily.   carbidopa-levodopa 50-200 MG tablet Commonly known as: SINEMET CR Take 1 tablet by mouth at bedtime.   Clinpro 5000 1.1 % Pste Generic drug: Sodium Fluoride Place onto teeth at bedtime.   diltiazem 120 MG 24 hr capsule Commonly known as: CARDIZEM CD Take 120 mg by mouth daily. HOLD IF SBP < 100   ferrous sulfate 325 (65 FE) MG tablet Take 325 mg by mouth. Once A Day on Mon, Fri   metolazone 2.5 MG tablet Commonly known as: ZAROXOLYN Take 2.5 mg by mouth. Once A Day on Mon, and Thurs. Hold if systolic blood pressure less than 100   Myrbetriq 25 MG Tb24 tablet Generic drug: mirabegron ER Take 25 mg by mouth daily.   rivaroxaban 20 MG Tabs tablet Commonly known as: XARELTO Take 20 mg by mouth at bedtime.   sennosides-docusate sodium 8.6-50 MG tablet Commonly known as: SENOKOT-S Take 1 tablet by mouth at bedtime.   Vitamin D3 10 MCG (400 UNIT) Caps Take 1 capsule by mouth daily.   zinc oxide 20 % ointment Apply 1 application topically as needed for irritation. To buttocks after every incontinent episode and as needed for redness. May keep at bedside.       Review of Systems  Constitutional: Negative for activity change, appetite change, chills, diaphoresis, fatigue, fever and unexpected weight change.  HENT: Positive for hearing loss. Negative for congestion and voice change.   Eyes: Negative  for visual disturbance.  Respiratory: Negative for cough, shortness of breath and wheezing.   Cardiovascular: Positive for leg swelling.  Gastrointestinal: Negative for abdominal distention, abdominal pain, constipation, diarrhea, nausea and vomiting.  Genitourinary: Positive for frequency and urgency. Negative for difficulty urinating and dysuria.  Musculoskeletal: Positive for arthralgias and gait problem.  Neurological: Negative for dizziness, facial asymmetry, speech difficulty and weakness.       Moves slow.   Psychiatric/Behavioral: Positive for sleep disturbance. Negative for agitation, behavioral problems and hallucinations. The patient is not nervous/anxious.     Immunization History  Administered Date(s) Administered  . Influenza Split 10/20/2012  . Influenza Whole 10/21/2006  . Influenza, High Dose Seasonal PF 10/17/2016  . Influenza-Unspecified 10/20/2013, 10/22/2017  . Pneumococcal Conjugate-13 01/31/2013  . Pneumococcal Polysaccharide-23  01/20/2005, 11/24/2011  . Td 01/21/2004  . Tdap 03/16/2014  . Zoster 04/17/2009   Pertinent  Health Maintenance Due  Topic Date Due  . COLONOSCOPY  06/12/2019  . INFLUENZA VACCINE  Completed  . DEXA SCAN  Completed  . PNA vac Low Risk Adult  Completed   Fall Risk  05/03/2018 11/03/2017 08/04/2017 03/06/2017 11/26/2016  Falls in the past year? 1 No No Yes Yes  Number falls in past yr: 0 - - 2 or more 2 or more  Injury with Fall? 0 - - No No  Risk Factor Category  - - - High Fall Risk High Fall Risk  Risk for fall due to : Other (Comment) - - - -  Follow up Falls evaluation completed;Education provided;Falls prevention discussed - - Falls evaluation completed Falls evaluation completed   Functional Status Survey:    Vitals:   01/19/19 0915  BP: (!) 90/50  Pulse: 83  Resp: 16  Temp: (!) 97.5 F (36.4 C)  SpO2: 93%  Weight: 133 lb 1.6 oz (60.4 kg)  Height: '5\' 8"'  (1.727 m)   Body mass index is 20.24 kg/m. Physical Exam  Vitals and nursing note reviewed.  Constitutional:      Appearance: Normal appearance.  HENT:     Head: Normocephalic and atraumatic.     Nose: Nose normal.     Mouth/Throat:     Mouth: Mucous membranes are moist.  Eyes:     Extraocular Movements: Extraocular movements intact.     Conjunctiva/sclera: Conjunctivae normal.     Pupils: Pupils are equal, round, and reactive to light.  Cardiovascular:     Rate and Rhythm: Normal rate. Rhythm irregular.     Heart sounds: No murmur.  Pulmonary:     Breath sounds: No wheezing, rhonchi or rales.  Abdominal:     General: Bowel sounds are normal. There is no distension.     Palpations: Abdomen is soft.     Tenderness: There is no abdominal tenderness. There is no right CVA tenderness, left CVA tenderness, guarding or rebound.  Musculoskeletal:     Cervical back: Normal range of motion and neck supple.     Right lower leg: Edema present.     Left lower leg: Edema present.     Comments: 1+ edema BLE  Skin:    General: Skin is warm and dry.  Neurological:     General: No focal deficit present.     Mental Status: She is alert and oriented to person, place, and time. Mental status is at baseline.     Motor: No weakness.     Coordination: Coordination normal.     Gait: Gait abnormal.  Psychiatric:        Mood and Affect: Mood normal.        Behavior: Behavior normal.        Thought Content: Thought content normal.        Judgment: Judgment normal.     Labs reviewed: Recent Labs    09/12/18 0304 09/13/18 0201 09/13/18 0201 09/14/18 0043 10/28/18 0000 11/18/18 0000 12/09/18 0000  NA 140 140   < > 140 142 144 140  K 3.6 3.8  --  3.9 4.0 4.1 3.9  CL 108 112*   < > 111 104 110* 103  CO2 21* 22   < > 23 28 27* 29*  GLUCOSE 89 95  --  109*  --   --   --   BUN 27* 24*   < >  '15 19 21 ' 24*  CREATININE 0.88 0.75   < > 0.68 0.8 0.7 0.7  CALCIUM 9.2 8.4*   < > 8.5* 9.9 9.3 9.4  MG  --  2.0  --   --   --   --   --    < > = values in  this interval not displayed.   Recent Labs    09/12/18 0304 09/13/18 0201 09/21/18 0000 12/09/18 0000  AST 60* 50* 20 16  ALT '7 7 20 10  ' ALKPHOS 45 36* 42 47  BILITOT 1.1 0.6  --   --   PROT 6.2* 4.9* 6.4  --   ALBUMIN 3.7 2.8* 3.9 3.8   Recent Labs    09/11/18 2105 09/12/18 0304 09/12/18 0304 09/13/18 0201 10/07/18 0000 10/14/18 0000 12/09/18 0000  WBC 10.6* 8.0   < > 8.9 7.1 5.4 5.5  NEUTROABS 9.4*  --   --  7.1  --   --  3,718  HGB 12.6 11.5*  --  10.5* 10.9* 10.1* 11.7*  HCT 40.1 35.2*  --  32.4* 33* 31* 37  MCV 96.4 94.4  --  93.9  --   --   --   PLT 170 150  --  125* 172 184 189   < > = values in this interval not displayed.   Lab Results  Component Value Date   TSH 0.408 09/11/2018   No results found for: HGBA1C Lab Results  Component Value Date   CHOL 185 12/09/2018   HDL 78 (A) 12/09/2018   LDLCALC 94 12/09/2018   LDLDIRECT 104.2 01/25/2013   TRIG 45 12/09/2018   CHOLHDL 2 08/29/2015    Significant Diagnostic Results in last 30 days:  No results found.  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   Parkinson's disease ([place]) Continue Sinemet, f/u neurology.   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 Continue Tylenol.   Overactive bladder Chronic, persisted urinary frequency, incontinency.   Bilateral lower extremity edema Chronic, observe, continue Metolazone.   Constipation Stable, continue Senokot S     Family/ staff Communication: plan of care reviewed with the patient and charge nurse.   Labs/tests ordered:  none  Time spend 25 minutes.

## 2019-01-19 NOTE — Assessment & Plan Note (Signed)
Heart rate is in control, continue Diltiazem, Xarelto

## 2019-01-19 NOTE — Assessment & Plan Note (Signed)
Continue Sinemet, f/u neurology.

## 2019-01-19 NOTE — Assessment & Plan Note (Signed)
Chronic, persisted urinary frequency, incontinency.

## 2019-01-19 NOTE — Assessment & Plan Note (Signed)
Chronic, observe, continue Metolazone.

## 2019-01-24 ENCOUNTER — Encounter: Payer: Self-pay | Admitting: Internal Medicine

## 2019-01-24 DIAGNOSIS — Z03818 Encounter for observation for suspected exposure to other biological agents ruled out: Secondary | ICD-10-CM | POA: Diagnosis not present

## 2019-01-26 ENCOUNTER — Ambulatory Visit: Payer: Medicare Other | Admitting: Physician Assistant

## 2019-01-26 NOTE — Telephone Encounter (Signed)
Message routed to [name], MD for I am not sure who schedules appointments/referral for skilled patients at [facility]

## 2019-01-27 ENCOUNTER — Non-Acute Institutional Stay (SKILLED_NURSING_FACILITY): Payer: Medicare PPO | Admitting: Internal Medicine

## 2019-01-27 ENCOUNTER — Encounter: Payer: Self-pay | Admitting: Internal Medicine

## 2019-01-27 DIAGNOSIS — R600 Localized edema: Secondary | ICD-10-CM

## 2019-01-27 DIAGNOSIS — I4819 Other persistent atrial fibrillation: Secondary | ICD-10-CM

## 2019-01-27 DIAGNOSIS — G20 Parkinson's disease: Secondary | ICD-10-CM

## 2019-01-27 DIAGNOSIS — M25551 Pain in right hip: Secondary | ICD-10-CM

## 2019-01-27 DIAGNOSIS — G20A1 Parkinson's disease without dyskinesia, without mention of fluctuations: Secondary | ICD-10-CM

## 2019-01-27 NOTE — Progress Notes (Signed)
Location:    [facility] Room Number: [place] of Service:  SNF [phone]) Provider:  [name], MD  [name], MD  Patient Care Team: [name], MD as PCP - General (Internal Medicine) [name], [name], MD as PCP - Cardiology (Cardiology) [name], [name], DO as Consulting Physician (Neurology) Mast, Man X, NP as Nurse Practitioner (Internal Medicine)  Extended Emergency Contact Information Primary Emergency Contact: [facility] Address: [address]          [place], [place] [address] [name] of [place] Phone: [phone] Mobile Phone: [phone] Relation: Son Secondary Emergency Contact: Latorya, Baut[place] Mobile Phone: [phone] Relation: Daughter Interpreter needed? No  Code Status:  DNR  Goals of care: Advanced Directive information Advanced Directives 11/09/2018  Does Patient Have a Medical Advance Directive? Yes  Type of Advance Directive Out of facility DNR (pink MOST or yellow form)  Does patient want to make changes to medical advance directive? No - Patient declined  Copy of [place] in Chart? -  Would patient like information on creating a medical advance directive? -  Pre-existing out of facility DNR order (yellow form or pink MOST form) -     Chief Complaint  Patient presents with  . Acute Visit    Hip pain    HPI:  Pt is a 79 y.o. female seen today for an acute visit for Right hip Pain  Patient has a history of Parkinson disease diagnosed 2 years ago on Sinemet and follows with Neurology, paroxysmal A. fib on Xarelto, hyperlipidemia, H/o Ovarian Cyst, Lower extremity edema,2 D echo with N EF Biatrial enlargementand Urinary Incontinence.  Seen today for Right hip Pain She says Pain is at night early Morning. She points mostly in Groin Area. Is having difficult time to Walk and also do her transfers due to pain. She thinks it is her Muscle spasms. Is walking with the [place]. No  Falls or Any other injuries  Past Medical History:  Diagnosis Date  . Acute lower UTI 09/14/2018  . Anemia 10/15/2018   2016 colonoscopy 10/14/18 wbc 5.4, Hgb 10.1, plt 184 12/01/18 wbc 4.5, Hgb 11.1, plt 188, neutrophils 63,  Na 139, K 3.7, Bun 23, creat 0.69, eGFR 83 on Fe, Hgb 11.7 12/09/18   . Atrial fibrillation ([place]) 05/21/2010   10/07/18 Na 143, K 4.0, Bun 20, creat 0.79, eGFR 72, wbc 7.1, Hgb 10.9, plt 172 10/28/18 Na 142, K 4.0, Bun 19, creat 0.77, eGFR 74 11/18/18 Na 144, K 4.1, Bun 21, creat 0.69, eGFR 83  . Bilateral lower extremity edema 07/19/2018   11/02/18 BMP 2 weeks.   . Cervical pain (neck) 06/20/2010  . Closed fracture of part of upper end of humerus 05/01/2015  . Colles' fracture of right radius 03/05/2015  . Constipation 07/19/2018  . DEGENERATIVE JOINT DISEASE, RIGHT HIP 02/16/2007  . Dupuytren's contracture   . Dysuria 09/20/2018   09/20/18 c/o got up several times last night to go urinate, burning on urination, lower abd /back discomfort, but urinary frequency, leakage are not new. UA C/S, Pyridium 172m tid x 2 days.  09/21/18 wbc 6.1, Hgb 11.8, plt 210, neutrophil 69.4, Na 142, K 4.1, Bun 19, creat 0.78, TP 6.4, albumin 3.9  . Hematuria 02/04/2014  . Hypotension 09/18/2018  . Long term (current) use of anticoagulants 05/29/2016  . Osteoarthritis of hip    right  . Osteoarthritis of left knee   . Overactive bladder  10/06/2018  . Parkinson disease ([place])   . Paroxysmal A-fib ([place])   . POSTMENOPAUSAL SYNDROME 02/16/2007  . PREMATURE ATRIAL CONTRACTIONS 02/16/2007  . 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 S/P breast biopsy, left    two o'clock position - benign  . 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 Tremor, unspecified 10/19/2015  . Unstable gait 02/16/2017  . Vaginal atrophy 10/19/2015  . Venous insufficiency   . Weakness 09/12/2018   Past Surgical History:  Procedure Laterality Date  . BREAST BIOPSY Left   . CATARACT EXTRACTION, BILATERAL      Allergies    Allergen Reactions  . Doxycycline Nausea And Vomiting  . Sulfonamide Derivatives     REACTION: HIVES    Allergies as of 01/27/2019      Reactions   Doxycycline Nausea And Vomiting   Sulfonamide Derivatives    REACTION: HIVES      Medication List       Accurate as of January 27, 2019  9:54 AM. If you have any questions, ask your nurse or doctor.        Acetaminophen 500 MG coapsule Take 2 capsules by mouth. Take 2 tablets at bedtime. Every 8 Hours - PRN   calcium carbonate 750 MG chewable tablet Commonly known as: TUMS EX Chew 1 tablet by mouth daily.   carbidopa-levodopa 25-100 MG tablet Commonly known as: SINEMET IR Take 2 tablets by mouth 4 (four) times daily.   carbidopa-levodopa 50-200 MG tablet Commonly known as: SINEMET CR Take 1 tablet by mouth at bedtime.   Clinpro 5000 1.1 % Pste Generic drug: Sodium Fluoride Place onto teeth at bedtime.   diltiazem 120 MG 24 hr capsule Commonly known as: CARDIZEM CD Take 120 mg by mouth daily. HOLD IF SBP < 100   ferrous sulfate 325 (65 FE) MG tablet Take 325 mg by mouth. Once A Day on Mon, Fri   metolazone 2.5 MG tablet Commonly known as: ZAROXOLYN Take 2.5 mg by mouth. Once A Day on Mon, and Thurs. Hold if systolic blood pressure less than 100   Myrbetriq 25 MG Tb24 tablet Generic drug: mirabegron ER Take 25 mg by mouth daily.   rivaroxaban 20 MG Tabs tablet Commonly known as: XARELTO Take 20 mg by mouth at bedtime.   sennosides-docusate sodium 8.6-50 MG tablet Commonly known as: SENOKOT-S Take 1 tablet by mouth at bedtime.   Vitamin D3 10 MCG (400 UNIT) Caps Take 1 capsule by mouth daily.   zinc oxide 20 % ointment Apply 1 application topically as needed for irritation. To buttocks after every incontinent episode and as needed for redness. May keep at bedside.       Review of Systems  Constitutional: Positive for activity change.  HENT: Negative.   Respiratory: Negative.   Cardiovascular: Positive  for leg swelling.  Gastrointestinal: Negative.   Genitourinary: Positive for frequency and urgency.  Musculoskeletal: Positive for arthralgias.  Neurological: Positive for weakness.  Psychiatric/Behavioral: Negative.     Immunization History  Administered Date(s) Administered  . Influenza Split 10/20/2012  . Influenza Whole 10/21/2006  . Influenza, High Dose Seasonal PF 10/17/2016  . Influenza-Unspecified 10/20/2013, 10/22/2017  . Pneumococcal Conjugate-13 01/31/2013  . Pneumococcal Polysaccharide-23 01/20/2005, 11/24/2011  . Td 01/21/2004  . Tdap 03/16/2014  . Zoster 04/17/2009   Pertinent  Health Maintenance Due  Topic Date Due  . COLONOSCOPY  06/12/2019  . INFLUENZA VACCINE  Completed  . DEXA SCAN  Completed  . PNA vac Low Risk Adult  Completed   Fall Risk  05/03/2018 11/03/2017 08/04/2017 03/06/2017 11/26/2016  Falls in the past year? 1 No No Yes Yes  Number falls in past yr: 0 - - 2 or more 2 or more  Injury with Fall? 0 - - No No  Risk Factor Category  - - - High Fall Risk High Fall Risk  Risk for fall due to : Other (Comment) - - - -  Follow up Falls evaluation completed;Education provided;Falls prevention discussed - - Falls evaluation completed Falls evaluation completed   Functional Status Survey:    Vitals:   01/27/19 0948  BP: 96/64  Pulse: 86  Resp: 16  Temp: (!) 96.6 F (35.9 C)  SpO2: 95%  Weight: 132 lb 1.6 oz (59.9 kg)  Height: '5\' 8"'  (1.727 m)   Body mass index is 20.09 kg/m. Physical Exam Vitals reviewed.  Constitutional:      Appearance: Normal appearance.  HENT:     Head: Normocephalic.     Nose: Nose normal.     Mouth/Throat:     Mouth: Mucous membranes are moist.     Pharynx: Oropharynx is clear.  Eyes:     Pupils: Pupils are equal, round, and reactive to light.  Cardiovascular:     Rate and Rhythm: Normal rate and regular rhythm.  Pulmonary:     Effort: Pulmonary effort is normal. No respiratory distress.     Breath sounds: Normal  breath sounds. No wheezing.  Abdominal:     General: Abdomen is flat. Bowel sounds are normal.     Palpations: Abdomen is soft.  Musculoskeletal:        General: Swelling present.     Cervical back: Neck supple.     Comments: Her Hip Exam was completly Normal with no Pain or Restriction of motion  Skin:    General: Skin is warm and dry.  Neurological:     General: No focal deficit present.     Mental Status: She is alert and oriented to person, place, and time.  Psychiatric:        Mood and Affect: Mood normal.        Thought Content: Thought content normal.     Labs reviewed: Recent Labs    09/12/18 0304 09/13/18 0201 09/13/18 0201 09/14/18 0043 10/28/18 0000 11/18/18 0000 12/09/18 0000  NA 140 140   < > 140 142 144 140  K 3.6 3.8  --  3.9 4.0 4.1 3.9  CL 108 112*   < > 111 104 110* 103  CO2 21* 22   < > 23 28 27* 29*  GLUCOSE 89 95  --  109*  --   --   --   BUN 27* 24*   < > '15 19 21 ' 24*  CREATININE 0.88 0.75   < > 0.68 0.8 0.7 0.7  CALCIUM 9.2 8.4*   < > 8.5* 9.9 9.3 9.4  MG  --  2.0  --   --   --   --   --    < > = values in this interval not displayed.   Recent Labs    09/12/18 0304 09/13/18 0201 09/21/18 0000 12/09/18 0000  AST 60* 50* 20 16  ALT '7 7 20 10  ' ALKPHOS 45 36* 42 47  BILITOT 1.1 0.6  --   --   PROT 6.2* 4.9* 6.4  --   ALBUMIN 3.7 2.8* 3.9 3.8   Recent Labs    09/11/18 2105 09/12/18 0304 09/12/18  0304 09/13/18 0201 10/07/18 0000 10/14/18 0000 12/09/18 0000  WBC 10.6* 8.0   < > 8.9 7.1 5.4 5.5  NEUTROABS 9.4*  --   --  7.1  --   --  3,718  HGB 12.6 11.5*  --  10.5* 10.9* 10.1* 11.7*  HCT 40.1 35.2*  --  32.4* 33* 31* 37  MCV 96.4 94.4  --  93.9  --   --   --   PLT 170 150  --  125* 172 184 189   < > = values in this interval not displayed.   Lab Results  Component Value Date   TSH 0.408 09/11/2018   No results found for: HGBA1C Lab Results  Component Value Date   CHOL 185 12/09/2018   HDL 78 (A) 12/09/2018   LDLCALC 94  12/09/2018   LDLDIRECT 104.2 01/25/2013   TRIG 45 12/09/2018   CHOLHDL 2 08/29/2015    Significant Diagnostic Results in last 30 days:  No results found.  Assessment/Plan Right hip pain Her Xray done in 10/20 did not show anything acute Will not repeat Xray for now Start on Meloxicam 7.5 mg Qd for 2 weeks Robaxin 250 mg QHS Prn for 2 weeks Ortho Consult pending  Persistent atrial fibrillation (HCC) On Xarelto Rate controlled on Cardizem  Bilateral lower extremity edema Doing well on Metolazone Bun and Creat are stable  Parkinson's disease ([place]) On Sinemet Follows with Neurology Urinary Incontinence Has Follow up with Urology tomorrow    Family/ staff Communication:   Labs/tests ordered:   Total time spent in this patient care encounter was  25_  minutes; greater than 50% of the visit spent counseling patient and staff, reviewing records , Labs and coordinating care for problems addressed at this encounter.

## 2019-01-28 ENCOUNTER — Ambulatory Visit: Payer: Medicare Other | Admitting: Physician Assistant

## 2019-01-28 DIAGNOSIS — N3946 Mixed incontinence: Secondary | ICD-10-CM | POA: Diagnosis not present

## 2019-01-28 DIAGNOSIS — R350 Frequency of micturition: Secondary | ICD-10-CM | POA: Diagnosis not present

## 2019-01-31 DIAGNOSIS — Z20828 Contact with and (suspected) exposure to other viral communicable diseases: Secondary | ICD-10-CM | POA: Diagnosis not present

## 2019-02-03 ENCOUNTER — Other Ambulatory Visit: Payer: Self-pay

## 2019-02-03 ENCOUNTER — Ambulatory Visit (INDEPENDENT_AMBULATORY_CARE_PROVIDER_SITE_OTHER): Payer: Medicare PPO | Admitting: Orthopaedic Surgery

## 2019-02-03 ENCOUNTER — Encounter: Payer: Self-pay | Admitting: Orthopaedic Surgery

## 2019-02-03 ENCOUNTER — Ambulatory Visit: Payer: Self-pay

## 2019-02-03 DIAGNOSIS — M25551 Pain in right hip: Secondary | ICD-10-CM | POA: Diagnosis not present

## 2019-02-03 NOTE — Progress Notes (Signed)
Subjective: Patient is here for ultrasound-guided intra-articular right hip injection.   Having significant groin pain and stiffness.  Objective:  Limited ROM due to pain.  Procedure: Ultrasound-guided right hip injection: After sterile prep with Betadine, injected 8 cc 1% lidocaine without epinephrine and 40 mg methylprednisolone using a 22-gauge spinal needle, passing the needle through the iliofemoral ligament into the femoral head/neck junction.  Injectate seen filling joint capsule.  Moderate immediate relief.

## 2019-02-03 NOTE — Progress Notes (Signed)
Office Visit Note   Patient: Stephanie Shaw           Date of Birth: [birth year]           MRN: [id number] Visit Date: 02/03/2019              Requested by: [name], MD [place],  [place] [id number] PCP: [name], MD   Assessment & Plan: Visit Diagnoses:  1. Pain in right hip     Plan: Impression is right hip osteoarthritis.  Based on discussion patient would like to try cortisone injection with Dr. Junius Roads today.  Patient instructed to follow-up in a a month if she does not receive any relief from this injection.  Follow-Up Instructions: Return if symptoms worsen or fail to improve.   Orders:  Orders Placed This Encounter  Procedures  . XR HIP UNILAT W OR W/O PELVIS 2-3 VIEWS RIGHT  . [facility] [place] - NO CHARGES   No orders of the defined types were placed in this encounter.     Procedures: No procedures performed   Clinical Data: No additional findings.   Subjective: Chief Complaint  Patient presents with  . Right Hip - Pain    Stephanie Shaw is a 79 year old female who lives permanently at friend's home due to Parkinson's who comes in for evaluation of right hip and groin pain for about a year that has gotten worse.  She takes Tylenol for the pain.  She has been referred here by her PCP.  Denies any radicular symptoms.  The pain does radiate from the groin down into the thigh and the knee.  Denies any injuries.   Review of Systems  Constitutional: Negative.   HENT: Negative.   Eyes: Negative.   Respiratory: Negative.   Cardiovascular: Negative.   Endocrine: Negative.   Musculoskeletal: Negative.   Neurological: Negative.   Hematological: Negative.   Psychiatric/Behavioral: Negative.   All other systems reviewed and are negative.    Objective: Vital Signs: There were no vitals taken for this visit.  Physical Exam Vitals and nursing note reviewed.  Constitutional:      Appearance: She is well-developed.  HENT:     Head: Normocephalic  and atraumatic.  Pulmonary:     Effort: Pulmonary effort is normal.  Abdominal:     Palpations: Abdomen is soft.  Musculoskeletal:     Cervical back: Neck supple.  Skin:    General: Skin is warm.     Capillary Refill: Capillary refill takes less than 2 seconds.  Neurological:     Mental Status: She is alert and oriented to person, place, and time.  Psychiatric:        Behavior: Behavior normal.        Thought Content: Thought content normal.        Judgment: Judgment normal.     Ortho Exam Right hip exam shows minimal tenderness to the greater trochanter.  Positive FADIR.  No sciatic tension signs. Specialty Comments:  No specialty comments available.  Imaging: MS[facility] [place] - NO CHARGES  Result Date: 02/03/2019 Please see Notes tab for imaging impression.  XR HIP UNILAT W OR W/O PELVIS 2-3 VIEWS RIGHT  Result Date: 02/03/2019 Mild right hip osteoarthritis without significant degenerative changes.    PMFS History: Patient Active Problem List   Diagnosis Date Noted  . Urge incontinence of urine 11/10/2018  . Anemia 10/15/2018  . Overactive bladder 10/06/2018  . Hypotension 09/18/2018  . Adnexal cyst 09/15/2018  .  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 Rhabdomyolysis 09/13/2018  . Weakness 09/12/2018  . Constipation 07/19/2018  . Bilateral lower extremity edema 07/19/2018  . 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 Unstable gait 02/16/2017  . Parkinsonism ([place]) 10/17/2016  . Left wrist pain 01/03/2016  . Vaginal atrophy 10/19/2015  . Tremor, unspecified 10/19/2015  . Osteopenia 10/19/2015  . 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 Closed fracture of part of upper end of humerus 05/01/2015  . Hematuria 02/04/2014  . 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 Cervical pain (neck) 06/20/2010  . Atrial fibrillation ([place]) 05/21/2010  . DEGENERATIVE JOINT DISEASE, RIGHT HIP 02/16/2007   Past Medical  History:  Diagnosis Date  . Acute lower UTI 09/14/2018  . Anemia 10/15/2018   2016 colonoscopy 10/14/18 wbc 5.4, Hgb 10.1, plt 184 12/01/18 wbc 4.5, Hgb 11.1, plt 188, neutrophils 63,  Na 139, K 3.7, Bun 23, creat 0.69, eGFR 83 on Fe, Hgb 11.7 12/09/18   . Atrial fibrillation ([place]) 05/21/2010   10/07/18 Na 143, K 4.0, Bun 20, creat 0.79, eGFR 72, wbc 7.1, Hgb 10.9, plt 172 10/28/18 Na 142, K 4.0, Bun 19, creat 0.77, eGFR 74 11/18/18 Na 144, K 4.1, Bun 21, creat 0.69, eGFR 83  . Bilateral lower extremity edema 07/19/2018   11/02/18 BMP 2 weeks.   . Cervical pain (neck) 06/20/2010  . Closed fracture of part of upper end of humerus 05/01/2015  . Colles' fracture of right radius 03/05/2015  . Constipation 07/19/2018  . DEGENERATIVE JOINT DISEASE, RIGHT HIP 02/16/2007  . Dupuytren's contracture   . Dysuria 09/20/2018   09/20/18 c/o got up several times last night to go urinate, burning on urination, lower abd /back discomfort, but urinary frequency, leakage are not new. UA C/S, Pyridium 129m tid x 2 days.  09/21/18 wbc 6.1, Hgb 11.8, plt 210, neutrophil 69.4, Na 142, K 4.1, Bun 19, creat 0.78, TP 6.4, albumin 3.9  . Hematuria 02/04/2014  . Hypotension 09/18/2018  . Long term (current) use of anticoagulants 05/29/2016  . Osteoarthritis of hip    right  . Osteoarthritis of left knee   . Overactive bladder 10/06/2018  . [place] disease ([place]   . Parox[place]mal [place]-fib ([place]   . POSTMENOPAUSAL SYNDROME 02/16/2007  . PREMATURE ATRIAL CONTRACTIONS 02/16/2007  . 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 S/P breast biopsy, left    two o'clock position - benign  . 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 Tremor, unspecified 10/19/2015  . Unstable gait 02/16/2017  . Vaginal atrophy 10/19/2015  . Venous insufficiency   . Weakness 09/12/2018    Family History  Problem Relation Age of Onset  . Cancer Father        ? type  . Alcohol abuse Father   . Cancer Mother        Pancreatic  . Diabetes Mother   .  Heart failure Mother   . Osteoarthritis Sister   . Healthy Sister   . Healthy Child   . Breast cancer Neg Hx     Past Surgical History:  Procedure Laterality Date  . BREAST BIOPSY Left   . CATARACT EXTRACTION, BILATERAL     Social History   Occupational History  . Occupation: retired    Comment: math, [employer]  Tobacco Use  . Smoking status: Former Smoker    Quit date: 08/27/1971    Years since quitting: 47.4  . Smokeless tobacco: Never Used  Substance  and Sexual Activity  . Alcohol use: No    Alcohol/week: 0.0 standard drinks  . Drug use: No  . Sexual activity: Not Currently    Comment: 1st intercourse 79 yo-Fewer than 5 partners

## 2019-02-07 DIAGNOSIS — Z20828 Contact with and (suspected) exposure to other viral communicable diseases: Secondary | ICD-10-CM | POA: Diagnosis not present

## 2019-02-08 NOTE — Progress Notes (Signed)
Cardiology Office Note  Date: 02/09/2019   ID: Stephanie Shaw, DOB 03-17-1940, MRN [id number]  PCP:  [name], MD  Cardiologist:  Mertie Moores, MD Electrophysiologist:  None   Chief Complaint  Patient presents with  . Follow-up    Chronic atrial fibrillation, weakness, lower extremity edema    History of Present Illness: Stephanie Shaw is a 79 y.o. female last encounter with Stephanie Perch, NP October 01, 2018 for follow-up related to chronic atrial fibrillation, weakness and bilateral lower extremity edema.  She has a history of Parkinson's and had been previously hospitalized for weakness and a fall.  She follows with neurology for Parkinson's.  Past medical history of chronic atrial fibrillation on Xarelto, osteoarthritis, Addison's disease, history of falls, Parkinson's disease, lower extremity edema.  She was previously started on Lasix with no improvement in leg edema and also complained of it making her dizzy.  Patient had a CT of the chest and lower extremity venous Doppler in August to rule out DVT/PE.  Both were negative for DVT and PE.  However CT showed cardiomegaly, coronary artery disease,and aortic atherosclerosis. Also showed 3.1 cm exophytic nodule of the right lobe of the thyroid.   Patient has had a long history of lower extremity edema.  She was advised to wear low-grade compression stockings previously.  Patient was watching her sodium intake to help decrease fluid accumulation.  Patient presents today with out any complaints.  Denies any dyspnea, weakness, dizziness, falls.  She resides in a skilled nursing facility.  She walks with a Stephanie Shaw.  Today she arrives in a wheelchair.  She is not very active on a daily basis.  She denies any anginal or exertional symptoms or sensation of palpitations or arrhythmias/racing heart.  Blood pressure low on arrival 80/54.  She states she is not symptomatic with this blood pressure.  Recheck in right arm 96/60.    She denies any episodes of bleeding.  No syncopal or near syncopal episodes.    She recently had steroid injection in her left hip for significant pain secondary to arthritis.  States that symptoms have been relieved to some degree but not totally alleviated.  She receives these injections periodically.    Past Medical History:  Diagnosis Date  . Acute lower UTI 09/14/2018  . Anemia 10/15/2018   2016 colonoscopy 10/14/18 wbc 5.4, Hgb 10.1, plt 184 12/01/18 wbc 4.5, Hgb 11.1, plt 188, neutrophils 63,  Na 139, K 3.7, Bun 23, creat 0.69, eGFR 83 on Fe, Hgb 11.7 12/09/18   . Atrial fibrillation ([place]) 05/21/2010   10/07/18 Na 143, K 4.0, Bun 20, creat 0.79, eGFR 72, wbc 7.1, Hgb 10.9, plt 172 10/28/18 Na 142, K 4.0, Bun 19, creat 0.77, eGFR 74 11/18/18 Na 144, K 4.1, Bun 21, creat 0.69, eGFR 83  . Bilateral lower extremity edema 07/19/2018   11/02/18 BMP 2 weeks.   . Cervical pain (neck) 06/20/2010  . Closed fracture of part of upper end of humerus 05/01/2015  . Colles' fracture of right radius 03/05/2015  . Constipation 07/19/2018  . DEGENERATIVE JOINT DISEASE, RIGHT HIP 02/16/2007  . Dupuytren's contracture   . Dysuria 09/20/2018   09/20/18 c/o got up several times last night to go urinate, burning on urination, lower abd /back discomfort, but urinary frequency, leakage are not new. UA C/S, Pyridium 16m tid x 2 days.  09/21/18 wbc 6.1, Hgb 11.8, plt 210, neutrophil 69.4, Na 142, K 4.1, Bun 19, creat 0.78, TP 6.4,  albumin 3.9  . Hematuria 02/04/2014  . Hypotension 09/18/2018  . Long term (current) use of anticoagulants 05/29/2016  . Osteoarthritis of hip    right  . Osteoarthritis of left knee   . Overactive bladder 10/06/2018  . Parkinson disease ([place])   . Paroxysmal A-fib ([place])   . POSTMENOPAUSAL SYNDROME 02/16/2007  . PREMATURE ATRIAL CONTRACTIONS 02/16/2007  . 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 S/P breast biopsy, left    two o'clock position - benign  . 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 Tremor, unspecified 10/19/2015  . Unstable gait 02/16/2017  . Vaginal atrophy 10/19/2015  . Venous insufficiency   . Weakness 09/12/2018    Past Surgical History:  Procedure Laterality Date  . BREAST BIOPSY Left   . CATARACT EXTRACTION, BILATERAL      Current Outpatient Medications  Medication Sig Dispense Refill  . Acetaminophen 500 MG coapsule Take 2 capsules by mouth. Take 2 tablets at bedtime. Every 8 Hours - PRN    . calcium carbonate (TUMS EX) 750 MG chewable tablet Chew 1 tablet by mouth daily.    . carbidopa-levodopa (SINEMET CR) 50-200 MG tablet Take 1 tablet by mouth at bedtime.    . carbidopa-levodopa (SINEMET IR) 25-100 MG tablet Take 2 tablets by mouth 4 (four) times daily.     . Cholecalciferol (VITAMIN D3) 10 MCG (400 UNIT) CAPS Take 1 capsule by mouth daily.    Marland Kitchen diltiazem (CARDIZEM CD) 120 MG 24 hr capsule Take 120 mg by mouth daily. HOLD IF SBP < 100    . ferrous sulfate 325 (65 FE) MG tablet Take 325 mg by mouth. Once A Day on Mon, Fri    . meloxicam (MOBIC) 7.5 MG tablet Take 7.5 mg by mouth daily as needed for pain (muscle spasm).    . methocarbamol (ROBAXIN) 750 MG tablet Take 750 mg by mouth at bedtime as needed for muscle spasms.    . metolazone (ZAROXOLYN) 2.5 MG tablet Take 2.5 mg by mouth. Once A Day on Mon, and Thurs. Hold if systolic blood pressure less than 100    . mirabegron ER (MYRBETRIQ) 25 MG TB24 tablet Take 25 mg by mouth daily.     . rivaroxaban (XARELTO) 20 MG TABS tablet Take 20 mg by mouth at bedtime.    . sennosides-docusate sodium (SENOKOT-S) 8.6-50 MG tablet Take 1 tablet by mouth at bedtime.    . Sodium Fluoride (CLINPRO 5000) 1.1 % PSTE Place onto teeth at bedtime.    Marland Kitchen zinc oxide 20 % ointment Apply 1 application topically as needed for irritation. To buttocks after every incontinent episode and as needed for redness. May keep at bedside.     No current facility-administered medications for this visit.   Allergies:   Doxycycline and Sulfonamide derivatives   Social History: The patient  reports that she quit smoking about 47 years ago. She has never used smokeless tobacco. She reports that she does not drink alcohol or use drugs.   Family History: The patient's family history includes Alcohol abuse in her father; Cancer in her father and mother; Diabetes in her mother; Healthy in her child and sister; Heart failure in her mother; Osteoarthritis in her sister.   ROS:  Please see the history of present illness. Otherwise, complete review of systems is positive for none.  All other systems are reviewed and negative.   Physical Exam: VS:  BP (!) 80/54   Pulse 84   Ht '5\' 8"'  (1.727  m)   Wt 131 lb (59.4 kg) Comment: report from pt taken this morning  SpO2 98%   BMI 19.92 kg/m , BMI Body mass index is 19.92 kg/m.  Wt Readings from Last 3 Encounters:  02/09/19 131 lb (59.4 kg)  01/27/19 132 lb 1.6 oz (59.9 kg)  01/19/19 133 lb 1.6 oz (60.4 kg)    General: Patient appears comfortable at rest.  Sitting in a wheelchair Neck: Supple, no elevated JVP or carotid bruits, no thyromegaly. Lungs: Clear to auscultation, nonlabored breathing at rest. Cardiac: Irregularly irregular rate and rhythm, no S3 or significant systolic murmur, no pericardial rub. Extremities: Mild nonpitting edema, distal pulses 2+. Skin: Warm and dry. Neuropsychiatric: Alert and oriented x3, affect grossly appropriate.  ECG:  An ECG dated February 09, 2019 was personally reviewed today and demonstrated:  Atrial fibrillation with a rate of 80 5-6 Recent Labwork: 09/11/2018: TSH 0.408 09/13/2018: Magnesium 2.0 12/09/2018: ALT 10; AST 16; BUN 24; Creatinine 0.7; Hemoglobin 11.7; Platelets 189; Potassium 3.9; Sodium 140     Component Value Date/Time   CHOL 185 12/09/2018 0000   TRIG 45 12/09/2018 0000   TRIG 35 12/25/2005 0935   HDL 78 (A) 12/09/2018 0000   CHOLHDL 2 08/29/2015 0741   VLDL 8.4 08/29/2015 0741   LDLCALC 94 12/09/2018  0000   LDLDIRECT 104.2 01/25/2013 0921    Other Studies Reviewed Today:  Echocardiogram July 02, 2018 IMPRESSIONS   1. The left ventricle has normal systolic function, with an ejection fraction of 55-60%. The cavity size was normal. Left ventricular diastolic Doppler parameters are indeterminate. No evidence of left ventricular regional wall motion abnormalities.  2. The right ventricle has normal systolic function. The cavity was normal. There is no increase in right ventricular wall thickness.  3. Left atrial size was moderately dilated.  4. Right atrial size was moderately dilated.  5. No evidence of mitral valve stenosis. Trivial mitral regurgitation.  6. The aortic valve is tricuspid. Mild calcification of the aortic valve. No stenosis of the aortic valve.  7. The aortic root is normal in size and structure.  8. The inferior vena cava was normal in size with <50% respiratory variability. 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 PA systolic pressure 30 mmHg.  9. The patient was in atrial fibrillation.  Low venous Doppler study of September 14, 2018 for suspected DVT/PE  Right: There is no evidence of deep vein thrombosis in the lower extremity. No cystic structure found in the popliteal fossa. Left: There is no evidence of deep vein thrombosis in the lower extremity. No cystic structure found in the  Chest CT September 13, 2018 for suspected PE IMPRESSION: 1.  Negative examination for pulmonary embolism. 2. Small bilateral pleural effusions and associated atelectasis or consolidation. 3. Cardiomegaly, coronary artery disease and aortic atherosclerosis. 4. Unchanged 3.1 cm exophytic nodule of the right lobe of the Thyroid.   Assessment and Plan:  1. Chronic atrial fibrillation ([place])   2. Weakness   3. Bilateral leg edema    1. Chronic atrial fibrillation ([facility]) EKG today shows atrial fibrillation with a rate of 80.  Continue diltiazem CD 120 mg daily.  Continue Xarelto 20  mg daily.  Hold Cardizem dosage if systolic blood pressure less than 100.  Patient states she is sure her Cardizem is being withheld for systolic blood pressures lower than 100 but not sure about them metolazone under similar directions for holding the medication.  I wrote a note on the discharge  physician orders for nursing home to reinforce holding metolazone as well as Cardizem for systolic blood pressure less than 100.  2. Weakness Patient states her weakness has improved.  She denies any recent falls.  She ambulates with a [place].  Today she arrives in a wheelchair.  She is not very active on a daily basis.  She is residing at a skilled nursing facility currently.  3. Bilateral leg edema She continues with mild lower extremity edema.  She states the edema has improved.  She has compression stockings to help with edema.  Continue metolazone 2.5 mg on Mondays and Thursdays.  Hold metolazone for systolic blood pressure less than 100.  Medication Adjustments/Labs and Tests Ordered: Current medicines are reviewed at length with the patient today.  Concerns regarding medicines are outlined above.    Patient Instructions  Medication Instructions:   Your physician recommends that you continue on your current medications as directed. Please refer to the Current Medication list given to you today.  *If you need a refill on your cardiac medications before your next appointment, please call your pharmacy*  Lab Work:  None ordered today  If you have labs (blood work) drawn today and your tests are completely normal, you will receive your results only by: Marland Kitchen MyChart Message (if you have MyChart) OR . A paper copy in the mail If you have any lab test that is abnormal or we need to change your treatment, we will call you to review the results.  Testing/Procedures:  None ordered today  Follow-Up: At [facility], you and your health needs are our priority.  As part of our continuing mission to  provide you with exceptional heart care, we have created designated Provider Care Teams.  These Care Teams include your primary Cardiologist (physician) and Advanced Practice Providers (APPs -  Physician Assistants and Nurse Practitioners) who all work together to provide you with the care you need, when you need it.  Your next appointment:   6 month(s)  The format for your next appointment:   In Person  Provider:   You may see Mertie Moores, MD or one of the following Advanced Practice Providers on your designated Care Team:    Richardson Dopp, PA-C  [place], PA-C  Stephanie Shaw, [place]            Signed, Levell July, NP 02/09/2019 3:58 PM    [facility], [place], [place] [address] Phone: [phone]; Fax: [phone]

## 2019-02-09 ENCOUNTER — Encounter: Payer: Self-pay | Admitting: Family Medicine

## 2019-02-09 ENCOUNTER — Ambulatory Visit: Payer: Medicare PPO | Admitting: Family Medicine

## 2019-02-09 ENCOUNTER — Other Ambulatory Visit: Payer: Self-pay

## 2019-02-09 VITALS — BP 80/54 | HR 84 | Ht 68.0 in | Wt 131.0 lb

## 2019-02-09 DIAGNOSIS — R531 Weakness: Secondary | ICD-10-CM | POA: Diagnosis not present

## 2019-02-09 DIAGNOSIS — R600 Localized edema: Secondary | ICD-10-CM

## 2019-02-09 DIAGNOSIS — I482 Chronic atrial fibrillation, unspecified: Secondary | ICD-10-CM | POA: Diagnosis not present

## 2019-02-09 NOTE — Patient Instructions (Signed)
Medication Instructions:   Your physician recommends that you continue on your current medications as directed. Please refer to the Current Medication list given to you today.  *If you need a refill on your cardiac medications before your next appointment, please call your pharmacy*  Lab Work:  None ordered today  If you have labs (blood work) drawn today and your tests are completely normal, you will receive your results only by: . MyChart Message (if you have MyChart) OR . A paper copy in the mail If you have any lab test that is abnormal or we need to change your treatment, we will call you to review the results.  Testing/Procedures:  None ordered today  Follow-Up: At [facility], you and your health needs are our priority.  As part of our continuing mission to provide you with exceptional heart care, we have created designated Provider Care Teams.  These Care Teams include your primary Cardiologist (physician) and Advanced Practice Providers (APPs -  Physician Assistants and Nurse Practitioners) who all work together to provide you with the care you need, when you need it.  Your next appointment:   6 month(s)  The format for your next appointment:   In Person  Provider:   You may see Philip [name], MD or one of the following Advanced Practice Providers on your designated Care Team:    Scott Weaver, PA-C  Vin Bhagat, PA-C  Janine Hammond, NP    

## 2019-02-10 ENCOUNTER — Non-Acute Institutional Stay (SKILLED_NURSING_FACILITY): Payer: Medicare PPO | Admitting: Internal Medicine

## 2019-02-10 ENCOUNTER — Encounter: Payer: Self-pay | Admitting: Internal Medicine

## 2019-02-10 DIAGNOSIS — I4819 Other persistent atrial fibrillation: Secondary | ICD-10-CM | POA: Diagnosis not present

## 2019-02-10 DIAGNOSIS — G20 Parkinson's disease: Secondary | ICD-10-CM

## 2019-02-10 DIAGNOSIS — E785 Hyperlipidemia, unspecified: Secondary | ICD-10-CM | POA: Diagnosis not present

## 2019-02-10 DIAGNOSIS — D649 Anemia, unspecified: Secondary | ICD-10-CM | POA: Diagnosis not present

## 2019-02-10 DIAGNOSIS — N3281 Overactive bladder: Secondary | ICD-10-CM

## 2019-02-10 DIAGNOSIS — M25551 Pain in right hip: Secondary | ICD-10-CM

## 2019-02-10 DIAGNOSIS — R600 Localized edema: Secondary | ICD-10-CM

## 2019-02-10 IMAGING — US US THYROID
1 series · 12 of 25 positions shown · non-contrast
Comparison: CT chest 09/24/2016, ultrasound 06/26/2010

CLINICAL DATA: 76-year-old female with a history of thyroid goiter
on chest CT

EXAM:
THYROID ULTRASOUND
TECHNIQUE: Ultrasound examination of the thyroid gland and adjacent soft
tissues was performed.

[Series 1: us thyroid · 0.04mm/px · 12 of 64 slices shown]
[im 3/64]
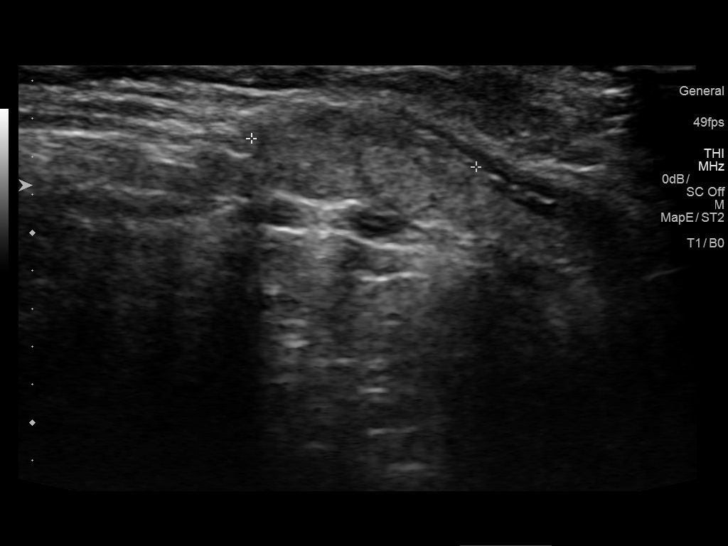
[im 8/64]
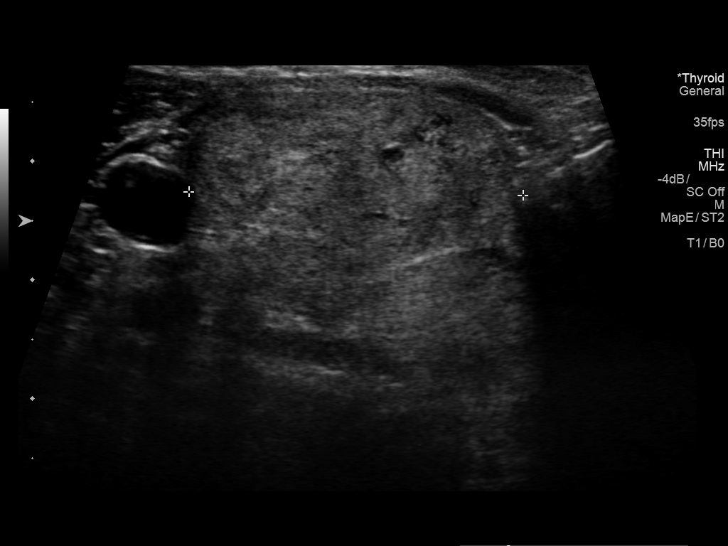
[im 14/64]
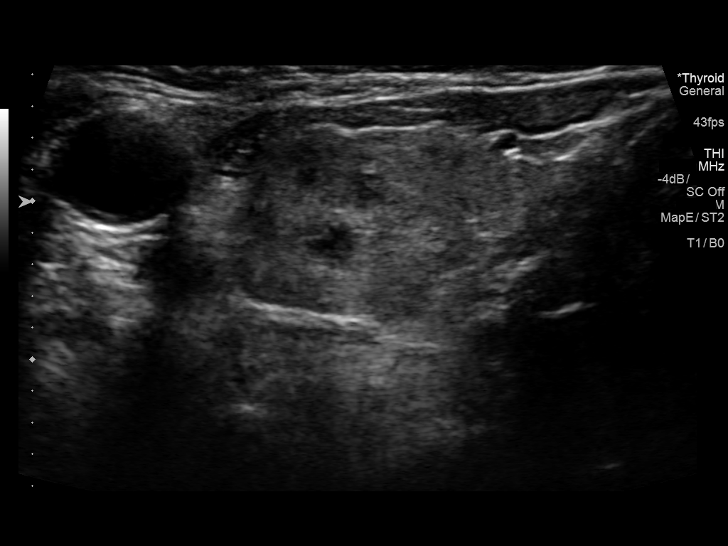
[im 19/64]
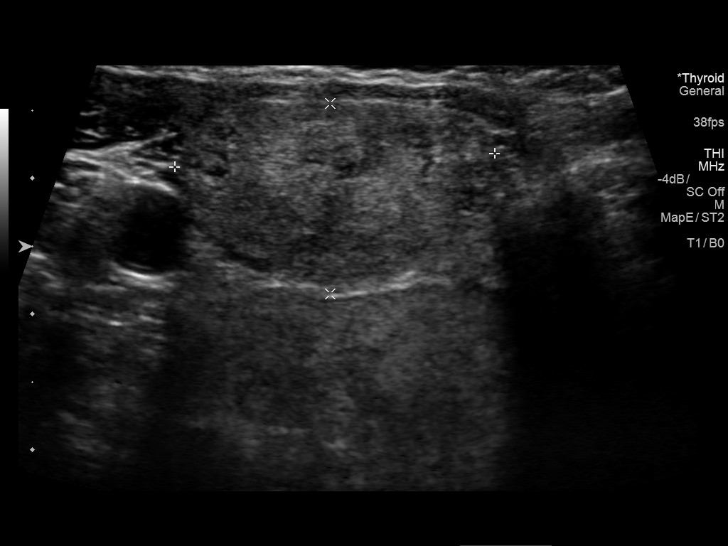
[im 24/64]
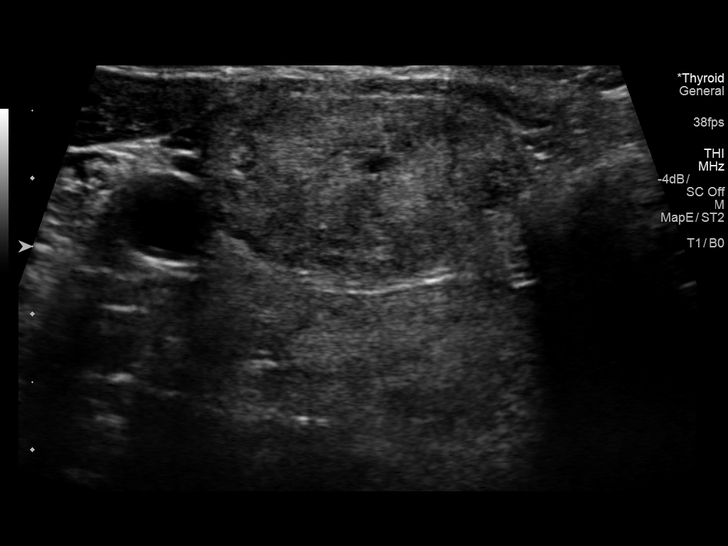
[im 29/64]
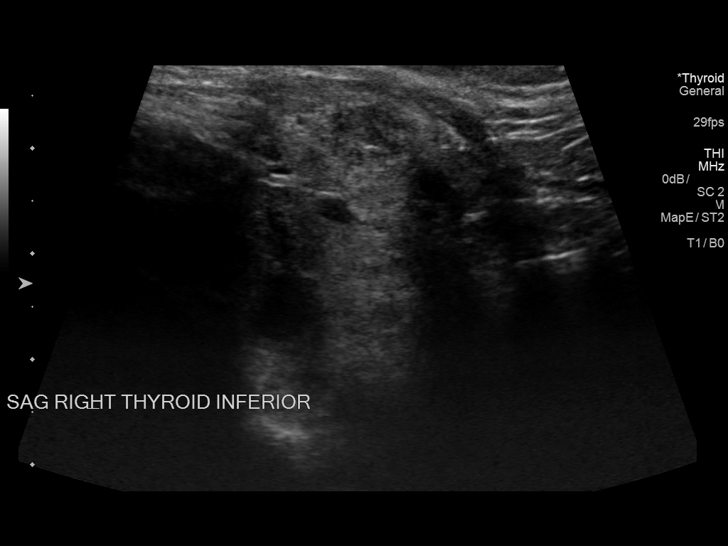
[im 35/64]
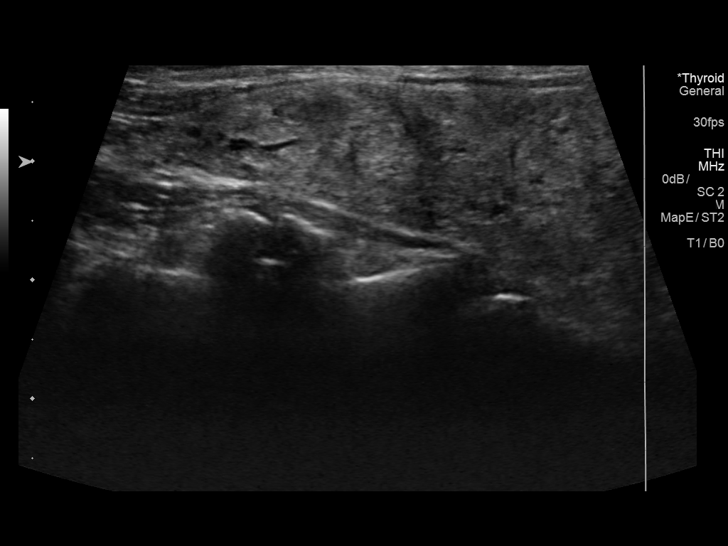
[im 40/64]
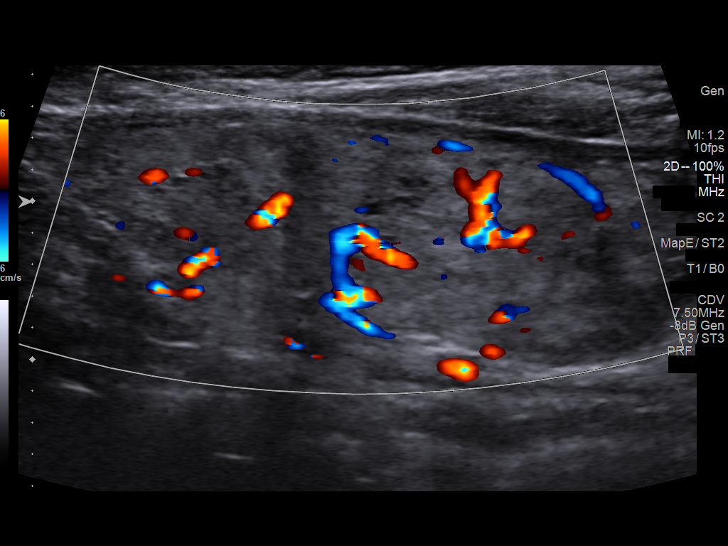
[im 45/64]
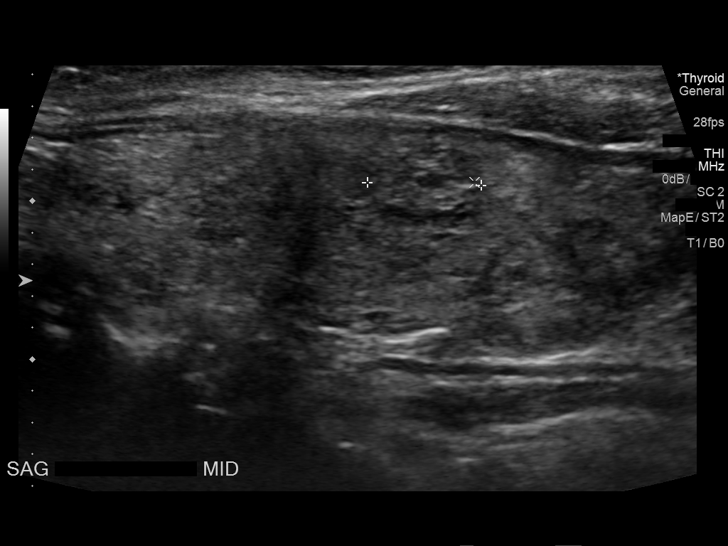
[im 50/64]
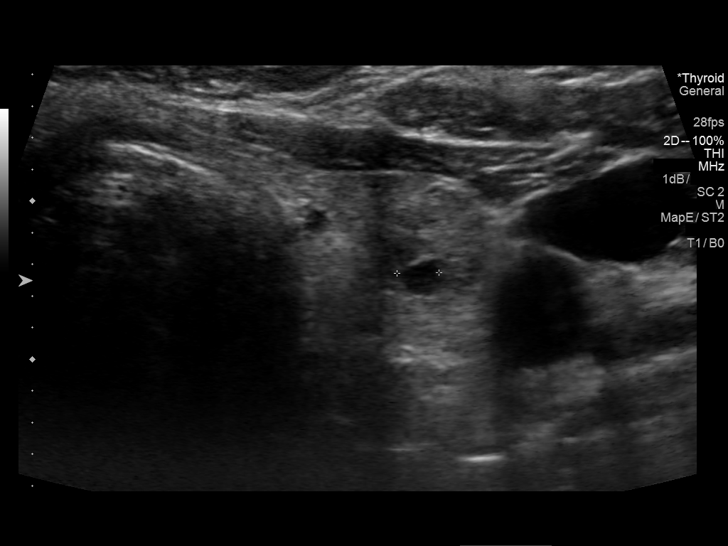
[im 56/64]
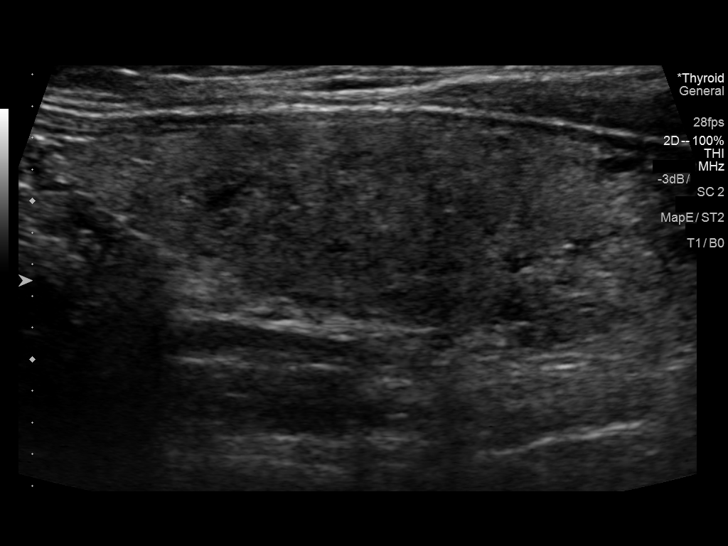
[im 61/64]
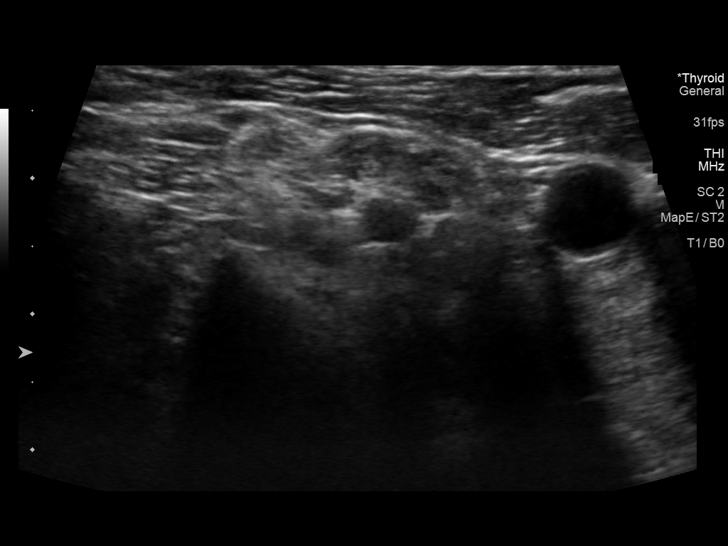

[12 of 25 positions shown; findings below may reference images not displayed]

FINDINGS: Parenchymal Echotexture: Moderately heterogenous

Isthmus: 0.4 cm

Right lobe: 6.8 cm x 2.7 cm x 2.8 cm. Inferior aspects of the right
thyroid extend be low the margin of the examination

Left lobe: 4.4 cm x 1.5 cm x 1.5 cm

_________________________________________________________

Estimated total number of nodules >/= 1 cm: 4

Number of spongiform nodules >/=  2 cm not described below (TR1): 0

Number of mixed cystic and solid nodules >/= 1.5 cm not described
below (TR2): 0

_________________________________________________________

Nodule # 1:

Location: Isthmus; Mid

Maximum size: 1.2 cm; Other 2 dimensions: 1.1 cm x 0.5 cm

Composition: solid/almost completely solid (2)

Echogenicity: isoechoic (1)

Shape: not taller-than-wide (0)

Margins: ill-defined (0)

Echogenic foci: none (0)

ACR TI-RADS total points: 3.

ACR TI-RADS risk category: TR3 (3 points).

ACR TI-RADS recommendations:

Nodule does not meet criteria for surveillance or biopsy

_________________________________________________________

Nodule # 2:

Location: Right; Superior

Maximum size: 1.2 cm; Other 2 dimensions: 1.2 cm x 0.7 cm

Composition: spongiform (0)

ACR TI-RADS recommendations:

Spongiform nodule does not meet criteria for surveillance or biopsy

_________________________________________________________

Nodule # 3:

Location: Right; Mid

Maximum size: 2.9 cm; Other 2 dimensions: 2.3 cm x 1.4 cm. Nodule
has decreased in size from the comparison ultrasound previously
measuring 3.0 cm

Composition: solid/almost completely solid (2)

Echogenicity: isoechoic (1)

Shape: not taller-than-wide (0)

Margins: ill-defined (0)

Echogenic foci: none (0)

ACR TI-RADS total points: 3.

ACR TI-RADS risk category: TR3 (3 points).

ACR TI-RADS recommendations:

Nodule meets criteria for biopsy, although has decreased in size
from the comparison ultrasound study.

_________________________________________________________

Nodule # 4:

Location: Right; Inferior

Maximum size: 2.6 cm; Other 2 dimensions: 2.3 cm x 2.0 cm. Nodule is
decreased in size from the comparison, previously 3.7 cm

Composition: solid/almost completely solid (2)

Echogenicity: isoechoic (1)

Shape: taller-than-wide (3)

Margins: ill-defined (0)

Echogenic foci: none (0)

ACR TI-RADS total points: 6.

ACR TI-RADS risk category: TR4 (4-6 points).

ACR TI-RADS recommendations:

Nodule meets criteria for biopsy, although has decreased in size
from prior.

_________________________________________________________

Nodule # 5:

Location: Left; Mid

Maximum size: 0.7 cm; Other 2 dimensions: 0.6 cm x 0.5 cm

Composition: spongiform (0)

ACR TI-RADS recommendations:

Spongiform nodule does not meet criteria for surveillance or biopsy

_________________________________________________________

No adenopathy
IMPRESSION: Multinodular, enlarged thyroid.

Two right-sided nodules, (labeled 3 and 4) favored to be benign
related to thyroid goiter, do meet criteria for ultrasound-guided
biopsy, however, have decreased size from the comparison ultrasound
studies. Either continued surveillance or biopsy may be reasonable.

Remainder of nodules do not meet criteria for surveillance or
biopsy.

Recommendations follow those established by the new ACR TI-RADS
criteria ([HOSPITAL] 6008;[DATE]).

## 2019-02-10 NOTE — Progress Notes (Signed)
Location:  [place] Room Number: 13 Place of Service:  SNF [phone]) Provider:  [name], MD  Patient Care Team: [name], MD as PCP - General (Internal Medicine) [name], [name], MD as PCP - Cardiology (Cardiology) [name], [name], DO as Consulting Physician (Neurology) Mast, Man X, NP as Nurse Practitioner (Internal Medicine)  Extended Emergency Contact Information Primary Emergency Contact: [facility] Address: [address]          [place], [place] [address] [name] Phone: [phone] Mobile Phone: [phone] Relation: Son Secondary Emergency Contact: Aveline, Daus Mobile Phone: [phone] Relation: Daughter Interpreter needed? No  Code Status:  DNR Goals of care: Advanced Directive information Advanced Directives 11/09/2018  Does Patient Have a Medical Advance Directive? Yes  Type of Advance Directive Out of facility DNR (pink MOST or yellow form)  Does patient want to make changes to medical advance directive? No - Patient declined  Copy of [place] in Chart? -  Would patient like information on creating a medical advance directive? -  Pre-existing out of facility DNR order (yellow form or pink MOST form) -     Chief Complaint  Patient presents with  . Medical Management of Chronic Issues    Routine [facility] visit    HPI:  Pt is a 79 y.o. female seen today for medical management of chronic diseases.    Patient has a history of Parkinson disease diagnosed 2 years ago on Sinemet and follows with Neurology, paroxysmal A. fib on Xarelto, hyperlipidemia, H/o Ovarian Cyst, Lower extremity edema,2 D echo with N EF Biatrial enlargementand Urinary Incontinence.  Her Active Problems are  Parkinson disease Her Sinemet was recently increased in the ER was added at night She is doing well. Does work with therapy twice a week Is able to do her transfers without any  help Lower extremity edema Continues on low-dose of metolazone and is doing really well with that Urinary incontinence Was recently seen by urologist and her Myrbetriq was increased She states her symptoms are not better Atrial fibrillation Was seen by cardiologist Her blood pressure was low at that time and they have suggested to hold her Cardizem if her systolic blood pressures less than 100 Constipation Per patient she is doing well now on senna Right hip pain Had right hip injected by Ortho. She states her pain got better but it has come back somewhat. But it is bearable she has a follow-up appointment with them Past Medical History:  Diagnosis Date  . Acute lower UTI 09/14/2018  . Anemia 10/15/2018   2016 colonoscopy 10/14/18 wbc 5.4, Hgb 10.1, plt 184 12/01/18 wbc 4.5, Hgb 11.1, plt 188, neutrophils 63,  Na 139, K 3.7, Bun 23, creat 0.69, eGFR 83 on Fe, Hgb 11.7 12/09/18   . Atrial fibrillation ([place]) 05/21/2010   10/07/18 Na 143, K 4.0, Bun 20, creat 0.79, eGFR 72, wbc 7.1, Hgb 10.9, plt 172 10/28/18 Na 142, K 4.0, Bun 19, creat 0.77, eGFR 74 11/18/18 Na 144, K 4.1, Bun 21, creat 0.69, eGFR 83  . Bilateral lower extremity edema 07/19/2018   11/02/18 BMP 2 weeks.   . Cervical pain (neck) 06/20/2010  . Closed fracture of part of upper end of humerus 05/01/2015  . Colles' fracture of right radius 03/05/2015  . Constipation 07/19/2018  . DEGENERATIVE JOINT DISEASE, RIGHT HIP 02/16/2007  . Dupuytren's contracture   . Dysuria 09/20/2018   09/20/18 c/o got up  several times last night to go urinate, burning on urination, lower abd /back discomfort, but urinary frequency, leakage are not new. UA C/S, Pyridium 148m tid x 2 days.  09/21/18 wbc 6.1, Hgb 11.8, plt 210, neutrophil 69.4, Na 142, K 4.1, Bun 19, creat 0.78, TP 6.4, albumin 3.9  . Hematuria 02/04/2014  . Hypotension 09/18/2018  . Long term (current) use of anticoagulants 05/29/2016  . Osteoarthritis of hip    right  . Osteoarthritis of left  knee   . Overactive bladder 10/06/2018  . Parkinson disease ([place]   . Paroxysmal A-fib ([place]   . POSTMENOPAUSAL SYNDROME 02/16/2007  . PREMATURE ATRIAL CONTRACTIONS 02/16/2007  . 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 S/P breast biopsy, left    two o'clock position - benign  . 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 Tremor, unspecified 10/19/2015  . Unstable gait 02/16/2017  . Vaginal atrophy 10/19/2015  . Venous insufficiency   . Weakness 09/12/2018   Past Surgical History:  Procedure Laterality Date  . BREAST BIOPSY Left   . CATARACT EXTRACTION, BILATERAL      Allergies  Allergen Reactions  . Doxycycline Nausea And Vomiting  . Sulfonamide Derivatives     REACTION: HIVES    Outpatient Encounter Medications as of 02/10/2019  Medication Sig  . Acetaminophen 500 MG coapsule Take 2 capsules by mouth See admin instructions. Take 2 tablets to = 1000 mg q8h prn and 1000 mg QHS scheduled.  . calcium carbonate (TUMS EX) 750 MG chewable tablet Chew 1 tablet by mouth daily.  . carbidopa-levodopa (SINEMET CR) 50-200 MG tablet Take 1 tablet by mouth at bedtime.  . carbidopa-levodopa (SINEMET IR) 25-100 MG tablet Take 2 tablets by mouth 4 (four) times daily.   . Cholecalciferol (VITAMIN D3) 10 MCG (400 UNIT) CAPS Take 1 capsule by mouth daily.  .Marland Kitchendiltiazem (CARDIZEM CD) 120 MG 24 hr capsule Take 120 mg by mouth daily. HOLD IF SBP < 100  . ferrous sulfate 325 (65 FE) MG tablet Take 325 mg by mouth. Once a day on Mon, Fri  . meloxicam (MOBIC) 7.5 MG tablet Take 7.5 mg by mouth daily as needed for pain (muscle spasm).  . methocarbamol (ROBAXIN) 500 MG tablet Take 250 mg by mouth at bedtime as needed for muscle spasms. One-half tablet to = 250 mg  . metolazone (ZAROXOLYN) 2.5 MG tablet Take 2.5 mg by mouth. Once A Day on Mon, and Thurs. Hold if systolic blood pressure less than 100  . mirabegron ER (MYRBETRIQ) 50 MG TB24 tablet Take 50 mg by mouth daily.  . rivaroxaban (XARELTO) 20 MG  TABS tablet Take 20 mg by mouth at bedtime.  . sennosides-docusate sodium (SENOKOT-S) 8.6-50 MG tablet Take 1 tablet by mouth at bedtime.  . Sodium Fluoride (CLINPRO 5000) 1.1 % PSTE Place onto teeth at bedtime.  .Marland Kitchenzinc oxide 20 % ointment Apply 1 application topically as needed for irritation. To buttocks after every incontinent episode and as needed for redness. May keep at bedside.  . [DISCONTINUED] methocarbamol (ROBAXIN) 750 MG tablet Take 750 mg by mouth at bedtime as needed for muscle spasms.  . [DISCONTINUED] mirabegron ER (MYRBETRIQ) 25 MG TB24 tablet Take 25 mg by mouth daily.    No facility-administered encounter medications on file as of 02/10/2019.    Review of Systems  Review of Systems  Constitutional: Negative for activity change, appetite change, chills, diaphoresis, fatigue and fever.  HENT: Negative for mouth sores, postnasal drip, rhinorrhea, sinus pain  and sore throat.   Respiratory: Negative for apnea, cough, chest tightness, shortness of breath and wheezing.   Cardiovascular: Negative for chest pain, palpitations and leg swelling.  Gastrointestinal: Negative for abdominal distention, abdominal pain, constipation, diarrhea, nausea and vomiting.  Genitourinary: Negative for dysuria and frequency.  Musculoskeletal: Negative for arthralgias, joint swelling and myalgias.  Skin: Negative for rash.  Neurological: Negative for dizziness, syncope, weakness, light-headedness and numbness.  Psychiatric/Behavioral: Negative for behavioral problems, confusion and sleep disturbance.     Immunization History  Administered Date(s) Administered  . Influenza Split 10/20/2012  . Influenza Whole 10/21/2006  . Influenza, High Dose Seasonal PF 10/17/2016  . Influenza-Unspecified 10/20/2013, 10/22/2017  . Moderna SARS-COVID-2 Vaccination 01/22/2019  . Pneumococcal Conjugate-13 01/31/2013  . Pneumococcal Polysaccharide-23 01/20/2005, 11/24/2011  . Td 01/21/2004  . Tdap 03/16/2014  .  Zoster 04/17/2009   Pertinent  Health Maintenance Due  Topic Date Due  . COLONOSCOPY  06/12/2019  . INFLUENZA VACCINE  Completed  . DEXA SCAN  Completed  . PNA vac Low Risk Adult  Completed   Fall Risk  05/03/2018 11/03/2017 08/04/2017 03/06/2017 11/26/2016  Falls in the past year? 1 No No Yes Yes  Number falls in past yr: 0 - - 2 or more 2 or more  Injury with Fall? 0 - - No No  Risk Factor Category  - - - High Fall Risk High Fall Risk  Risk for fall due to : Other (Comment) - - - -  Follow up Falls evaluation completed;Education provided;Falls prevention discussed - - Falls evaluation completed Falls evaluation completed   Functional Status Survey:    Vitals:   02/10/19 1429  BP: 138/90  Pulse: 90  Resp: 16  Temp: (!) 97.4 F (36.3 C)  TempSrc: Oral  SpO2: 94%  Weight: 134 lb 9.6 oz (61.1 kg)  Height: '5\' 8"'  (1.727 m)   Body mass index is 20.47 kg/m. Physical Exam Constitutional: Oriented to person, place, and time. Well-developed and well-nourished.  HENT:  Head: Normocephalic.  Mouth/Throat: Oropharynx is clear and moist.  Eyes: Pupils are equal, round, and reactive to light.  Neck: Neck supple.  Cardiovascular: Normal rate and normal heart sounds.  No murmur heard. Pulmonary/Chest: Effort normal and breath sounds normal. No respiratory distress. No wheezes. She has no rales.  Abdominal: Soft. Bowel sounds are normal. No distension. There is no tenderness. There is no rebound.  Musculoskeletal: Mild edema Bilateral Lymphadenopathy: none Neurological: Alert and oriented to person, place, and time.  Skin: Skin is warm and dry.  Psychiatric: Normal mood and affect. Behavior is normal. Thought content normal.   Labs reviewed: Recent Labs    09/12/18 0304 09/12/18 0304 09/13/18 0201 09/13/18 0201 09/14/18 0043 09/21/18 0000 10/28/18 0000 11/18/18 0000 12/09/18 0000  NA 140   < > 140   < > 140   < > 142 144 140  K 3.6   < > 3.8   < > 3.9   < > 4.0 4.1 3.9  CL  108   < > 112*   < > 111   < > 104 110* 103  CO2 21*   < > 22   < > 23   < > 28 27* 29*  GLUCOSE 89  --  95  --  109*  --   --   --   --   BUN 27*   < > 24*   < > 15   < > 19 21 24*  CREATININE 0.88   < >  0.75   < > 0.68   < > 0.8 0.7 0.7  CALCIUM 9.2   < > 8.4*   < > 8.5*   < > 9.9 9.3 9.4  MG  --   --  2.0  --   --   --   --   --   --    < > = values in this interval not displayed.   Recent Labs    09/12/18 0304 09/12/18 0304 09/13/18 0201 09/21/18 0000 12/09/18 0000  AST 60*   < > 50* 20 16  ALT 7   < > '7 20 10  ' ALKPHOS 45   < > 36* 42 47  BILITOT 1.1  --  0.6  --   --   PROT 6.2*  --  4.9* 6.4  --   ALBUMIN 3.7   < > 2.8* 3.9 3.8   < > = values in this interval not displayed.   Recent Labs    09/11/18 2105 09/11/18 2105 09/12/18 0304 09/12/18 0304 09/13/18 0201 09/21/18 0000 10/07/18 0000 10/14/18 0000 12/09/18 0000  WBC 10.6*   < > 8.0   < > 8.9   < > 7.1 5.4 5.5  NEUTROABS 9.4*  --   --   --  7.1  --   --   --  3,718  HGB 12.6   < > 11.5*   < > 10.5*   < > 10.9* 10.1* 11.7*  HCT 40.1   < > 35.2*   < > 32.4*   < > 33* 31* 37  MCV 96.4  --  94.4  --  93.9  --   --   --   --   PLT 170   < > 150   < > 125*   < > 172 184 189   < > = values in this interval not displayed.   Lab Results  Component Value Date   TSH 0.408 09/11/2018   No results found for: HGBA1C Lab Results  Component Value Date   CHOL 185 12/09/2018   HDL 78 (A) 12/09/2018   LDLCALC 94 12/09/2018   LDLDIRECT 104.2 01/25/2013   TRIG 45 12/09/2018   CHOLHDL 2 08/29/2015    Significant Diagnostic Results in last 30 days:  MSK [place] - NO CHARGES  Result Date: 02/03/2019 Please see Notes tab for imaging impression.  XR HIP UNILAT W OR W/O PELVIS 2-3 VIEWS RIGHT  Result Date: 02/03/2019 Mild right hip osteoarthritis without significant degenerative changes.   Assessment/Plan Bilateral lower extremity edema Stable on Metolazone 2/week Creat stable  Parkinson's disease ([place]) Doing well  with her Symptoms.  Continue on Sinemet  Persistent atrial fibrillation (HCC) On Cardizem and Xarelto  Right hip pain S/P Injection Pain seems more controlled Also on Mobic and Robaxin PRN  Overactive bladder Myrbetriq Dose increased  Per Urology Has follow up with them  Anemia, unspecified type Hgb Stable On iron 2/week Hyperlipidemia, unspecified hyperlipidemia type Continue on Statin Constipation Better per patient on Senna   Family/ staff Communication:   Labs/tests ordered:    Total time spent in this patient care encounter was  25_  minutes; greater than 50% of the visit spent counseling patient and staff, reviewing records , Labs and coordinating care for problems addressed at this encounter.

## 2019-02-14 DIAGNOSIS — Z20828 Contact with and (suspected) exposure to other viral communicable diseases: Secondary | ICD-10-CM | POA: Diagnosis not present

## 2019-02-21 DIAGNOSIS — Z20828 Contact with and (suspected) exposure to other viral communicable diseases: Secondary | ICD-10-CM | POA: Diagnosis not present

## 2019-02-23 ENCOUNTER — Non-Acute Institutional Stay (SKILLED_NURSING_FACILITY): Payer: Medicare PPO | Admitting: Nurse Practitioner

## 2019-02-23 ENCOUNTER — Encounter: Payer: Self-pay | Admitting: Nurse Practitioner

## 2019-02-23 DIAGNOSIS — R600 Localized edema: Secondary | ICD-10-CM | POA: Diagnosis not present

## 2019-02-23 DIAGNOSIS — G20C Parkinsonism, unspecified: Secondary | ICD-10-CM

## 2019-02-23 DIAGNOSIS — N3281 Overactive bladder: Secondary | ICD-10-CM

## 2019-02-23 DIAGNOSIS — G20 Parkinson's disease: Secondary | ICD-10-CM | POA: Diagnosis not present

## 2019-02-23 DIAGNOSIS — K5900 Constipation, unspecified: Secondary | ICD-10-CM | POA: Diagnosis not present

## 2019-02-23 DIAGNOSIS — D649 Anemia, unspecified: Secondary | ICD-10-CM

## 2019-02-23 DIAGNOSIS — I4819 Other persistent atrial fibrillation: Secondary | ICD-10-CM

## 2019-02-23 NOTE — Progress Notes (Signed)
Location:   [facility] Room Number: 13 Place of Service:  SNF (31) Provider:  Mast, Man NP  [name], MD  Patient Care Team: [name], MD as PCP - General (Internal Medicine) [name], [name], MD as PCP - Cardiology (Cardiology) [name], [name], DO as Consulting Physician (Neurology) Mast, Man X, NP as Nurse Practitioner (Internal Medicine)  Extended Emergency Contact Information Primary Emergency Contact: [facility] Address: [address]          [place], [place] [address] [name] of [place] Phone: [phone] Mobile Phone: [phone] Relation: Son Secondary Emergency Contact: Debroah, Shuttleworth Mobile Phone: [phone] Relation: Daughter Interpreter needed? No  Code Status:  DNR Goals of care: Advanced Directive information Advanced Directives 02/24/2019  Does Patient Have a Medical Advance Directive? Yes  Type of Paramedic of [place];Out of facility DNR (pink MOST or yellow form)  Does patient want to make changes to medical advance directive? No - Patient declined  Copy of [place] in Chart? Yes - validated most recent copy scanned in chart (See row information)  Would patient like information on creating a medical advance directive? -  Pre-existing out of facility DNR order (yellow form or pink MOST form) Yellow form placed in chart (order not valid for inpatient use)     Chief Complaint  Patient presents with  . Medical Management of Chronic Issues    HPI:  Pt is a 79 y.o. female seen today for medical management of chronic diseases.    The patient resides in [facility] for safety, care assistance,  Chronic right lower back/hip/knee pain, positional, usually worse in morning or after sitting in w/c too long, taking Tylenol 1052m tid, Mobic 143mqd, prn Methocarbamol 25053mid, wants to address the issue further  in her upcoming appointment with Ortho 03/08/19. Urinary frequency,  improved, nocturnal urination about 1-2x/night, on Myrbetriq 44m69m. Parkinson's disease, moves slow, on Sinemet. Afib, heart rate is in control, on Diltiazem, Xarelto. Anemia, stable, on Fe. Chronic edema BLE, Metolazone 2.5mg 41mwk. Constipation, stable, taking Senokot S I qd    Past Medical History:  Diagnosis Date  . Acute lower UTI 09/14/2018  . Anemia 10/15/2018   2016 colonoscopy 10/14/18 wbc 5.4, Hgb 10.1, plt 184 12/01/18 wbc 4.5, Hgb 11.1, plt 188, neutrophils 63,  Na 139, K 3.7, Bun 23, creat 0.69, eGFR 83 on Fe, Hgb 11.7 12/09/18   . Atrial fibrillation (HCC) [place]/2012   10/07/18 Na 143, K 4.0, Bun 20, creat 0.79, eGFR 72, wbc 7.1, Hgb 10.9, plt 172 10/28/18 Na 142, K 4.0, Bun 19, creat 0.77, eGFR 74 11/18/18 Na 144, K 4.1, Bun 21, creat 0.69, eGFR 83  . Bilateral lower extremity edema 07/19/2018   11/02/18 BMP 2 weeks.   . Cervical pain (neck) 06/20/2010  . Closed fracture of part of upper end of humerus 05/01/2015  . Colles' fracture of right radius 03/05/2015  . Constipation 07/19/2018  . DEGENERATIVE JOINT DISEASE, RIGHT HIP 02/16/2007  . Dupuytren's contracture   . Dysuria 09/20/2018   09/20/18 c/o got up several times last night to go urinate, burning on urination, lower abd /back discomfort, but urinary frequency, leakage are not new. UA C/S, Pyridium 100mg 61mx 2 days.  09/21/18 wbc 6.1, Hgb 11.8, plt 210, neutrophil 69.4, Na 142, K 4.1, Bun 19, creat 0.78, TP 6.4, albumin 3.9  . Hematuria 02/04/2014  . Hypotension 09/18/2018  . Long term (current)  use of anticoagulants 05/29/2016  . Osteoarthritis of hip    right  . Osteoarthritis of left knee   . Overactive bladder 10/06/2018  . Parkinson disease ([place])   . Paroxysmal A-fib ([place])   . POSTMENOPAUSAL SYNDROME 02/16/2007  . PREMATURE ATRIAL CONTRACTIONS 02/16/2007  . 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 S/P breast biopsy, left    two o'clock position - benign  . 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 Tremor,  unspecified 10/19/2015  . Unstable gait 02/16/2017  . Vaginal atrophy 10/19/2015  . Venous insufficiency   . Weakness 09/12/2018   Past Surgical History:  Procedure Laterality Date  . BREAST BIOPSY Left   . CATARACT EXTRACTION, BILATERAL      Allergies  Allergen Reactions  . Doxycycline Nausea And Vomiting  . Sulfonamide Derivatives     REACTION: HIVES    Allergies as of 02/23/2019      Reactions   Doxycycline Nausea And Vomiting   Sulfonamide Derivatives    REACTION: HIVES      Medication List       Accurate as of February 23, 2019 11:59 PM. If you have any questions, ask your nurse or doctor.        STOP taking these medications   meloxicam 7.5 MG tablet Commonly known as: MOBIC Stopped by: Man X Mast, NP   methocarbamol 500 MG tablet Commonly known as: ROBAXIN Stopped by: Man X Mast, NP     TAKE these medications   acetaminophen 500 MG tablet Commonly known as: TYLENOL Take 1,000 mg by mouth at bedtime as needed. Scheduled   Acetaminophen 500 MG coapsule Take 2 capsules by mouth See admin instructions. Take 2 tablets to = 1000 mg q8h prn   calcium carbonate 750 MG chewable tablet Commonly known as: TUMS EX Chew 1 tablet by mouth daily.   carbidopa-levodopa 25-100 MG tablet Commonly known as: SINEMET IR Take 2 tablets by mouth 4 (four) times daily.   carbidopa-levodopa 50-200 MG tablet Commonly known as: SINEMET CR Take 1 tablet by mouth at bedtime.   Clinpro 5000 1.1 % Pste Generic drug: Sodium Fluoride Place onto teeth at bedtime.   diltiazem 120 MG 24 hr capsule Commonly known as: CARDIZEM CD Take 120 mg by mouth daily. HOLD IF SBP < 100   ferrous sulfate 325 (65 FE) MG tablet Take 325 mg by mouth. Once a day on Mon, Fri   metolazone 2.5 MG tablet Commonly known as: ZAROXOLYN Take 2.5 mg by mouth. Once A Day on Mon, and Thurs. Hold if systolic blood pressure less than 100   Myrbetriq 50 MG Tb24 tablet Generic drug: mirabegron ER Take 50 mg  by mouth daily.   rivaroxaban 20 MG Tabs tablet Commonly known as: XARELTO Take 20 mg by mouth at bedtime.   sennosides-docusate sodium 8.6-50 MG tablet Commonly known as: SENOKOT-S Take 1 tablet by mouth at bedtime.   Vitamin D3 10 MCG (400 UNIT) Caps Take 1 capsule by mouth daily.   zinc oxide 20 % ointment Apply 1 application topically as needed for irritation. To buttocks after every incontinent episode and as needed for redness. May keep at bedside.       Review of Systems  Constitutional: Negative for activity change, appetite change, chills, diaphoresis, fatigue, fever and unexpected weight change.  HENT: Negative for congestion and voice change.   Eyes: Negative for visual disturbance.  Respiratory: Negative for cough, shortness of breath and wheezing.   Cardiovascular: Positive for  leg swelling. Negative for chest pain and palpitations.  Gastrointestinal: Negative for abdominal distention, abdominal pain, constipation, diarrhea, nausea and vomiting.  Genitourinary: Negative for frequency and urgency.  Musculoskeletal: Positive for arthralgias, back pain and gait problem.  Skin: Negative for color change and pallor.  Neurological: Negative for dizziness, speech difficulty, weakness and headaches.  Psychiatric/Behavioral: Negative for agitation, behavioral problems, hallucinations and sleep disturbance. The patient is not nervous/anxious.     Immunization History  Administered Date(s) Administered  . Influenza Split 10/20/2012  . Influenza Whole 10/21/2006  . Influenza, High Dose Seasonal PF 10/17/2016  . Influenza-Unspecified 10/20/2013, 10/22/2017  . Moderna SARS-COVID-2 Vaccination 01/22/2019, 02/19/2019  . Pneumococcal Conjugate-13 01/31/2013  . Pneumococcal Polysaccharide-23 01/20/2005, 11/24/2011  . Td 01/21/2004  . Tdap 03/16/2014  . Zoster 04/17/2009   Pertinent  Health Maintenance Due  Topic Date Due  . COLONOSCOPY  06/12/2019  . INFLUENZA VACCINE   Completed  . DEXA SCAN  Completed  . PNA vac Low Risk Adult  Completed   Fall Risk  05/03/2018 11/03/2017 08/04/2017 03/06/2017 11/26/2016  Falls in the past year? 1 No No Yes Yes  Number falls in past yr: 0 - - 2 or more 2 or more  Injury with Fall? 0 - - No No  Risk Factor Category  - - - High Fall Risk High Fall Risk  Risk for fall due to : Other (Comment) - - - -  Follow up Falls evaluation completed;Education provided;Falls prevention discussed - - Falls evaluation completed Falls evaluation completed   Functional Status Survey:    Vitals:   02/23/19 1006  BP: (!) 80/52  Pulse: 84  Resp: 18  Temp: (!) 97.3 F (36.3 C)  SpO2: 97%  Weight: 134 lb 14.4 oz (61.2 kg)  Height: 5' 8" (1.727 m)   Body mass index is 20.51 kg/m. Physical Exam Vitals and nursing note reviewed.  Constitutional:      Appearance: Normal appearance.  HENT:     Head: Normocephalic and atraumatic.     Nose: Nose normal.     Mouth/Throat:     Mouth: Mucous membranes are moist.  Eyes:     Extraocular Movements: Extraocular movements intact.     Conjunctiva/sclera: Conjunctivae normal.     Pupils: Pupils are equal, round, and reactive to light.  Cardiovascular:     Rate and Rhythm: Normal rate. Rhythm irregular.     Heart sounds: No murmur.  Pulmonary:     Breath sounds: No wheezing, rhonchi or rales.  Abdominal:     General: Bowel sounds are normal. There is no distension.     Palpations: Abdomen is soft.     Tenderness: There is no abdominal tenderness. There is no right CVA tenderness, left CVA tenderness, guarding or rebound.  Musculoskeletal:     Cervical back: Normal range of motion and neck supple.     Right lower leg: Edema present.     Left lower leg: Edema present.     Comments: Trace to 1+ edema BLE  Skin:    General: Skin is warm and dry.  Neurological:     General: No focal deficit present.     Mental Status: She is alert and oriented to person, place, and time. Mental status is  at baseline.     Motor: No weakness.     Coordination: Coordination abnormal.     Gait: Gait abnormal.     Comments: Moves slow  Psychiatric:        Mood  and Affect: Mood normal.        Behavior: Behavior normal.        Thought Content: Thought content normal.        Judgment: Judgment normal.     Labs reviewed: Recent Labs    09/12/18 0304 09/12/18 0304 09/13/18 0201 09/13/18 0201 09/14/18 0043 09/21/18 0000 10/28/18 0000 11/18/18 0000 12/09/18 0000  NA 140   < > 140   < > 140   < > 142 144 140  K 3.6   < > 3.8   < > 3.9   < > 4.0 4.1 3.9  CL 108   < > 112*   < > 111   < > 104 110* 103  CO2 21*   < > 22   < > 23   < > 28 27* 29*  GLUCOSE 89  --  95  --  109*  --   --   --   --   BUN 27*   < > 24*   < > 15   < > 19 21 24*  CREATININE 0.88   < > 0.75   < > 0.68   < > 0.8 0.7 0.7  CALCIUM 9.2   < > 8.4*   < > 8.5*   < > 9.9 9.3 9.4  MG  --   --  2.0  --   --   --   --   --   --    < > = values in this interval not displayed.   Recent Labs    09/12/18 0304 09/12/18 0304 09/13/18 0201 09/21/18 0000 12/09/18 0000  AST 60*   < > 50* 20 16  ALT 7   < > _0 ALKPHOS 45   < > 36* 42 47  BILITOT 1.1  --  0.6  --   --   PROT 6.2*  --  4.9* 6.4  --   ALBUMIN 3.7   < > 2.8* 3.9 3.8   < > = values in this interval not displayed.   Recent Labs    09/11/18 2105 09/11/18 2105 09/12/18 0304 09/12/18 0304 09/13/18 0201 09/21/18 0000 10/07/18 0000 10/14/18 0000 12/09/18 0000  WBC 10.6*   < > 8.0   < > 8.9   < > 7.1 5.4 5.5  NEUTROABS 9.4*  --   --   --  7.1  --   --   --  3,718  HGB 12.6   < > 11.5*   < > 10.5*   < > 10.9* 10.1* 11.7*  HCT 40.1   < > 35.2*   < > 32.4*   < > 33* 31* 37  MCV 96.4  --  94.4  --  93.9  --   --   --   --   PLT 170   < > 150   < > 125*   < > 172 184 189   < > = values in this interval not displayed.   Lab Results  Component Value Date   TSH 0.408 09/11/2018   No results found for: HGBA1C Lab Results  Component Value Date   CHOL  185 12/09/2018   HDL 78 (A) 12/09/2018   LDLCALC 94 12/09/2018   LDLDIRECT 104.2 01/25/2013   TRIG 45 12/09/2018   CHOLHDL 2 08/29/2015    Significant Diagnostic Results in last 30 days:  MSK [place] - NO CHARGES  Result Date: 02/03/2019 Please see  Notes tab for imaging impression.  XR HIP UNILAT W OR W/O PELVIS 2-3 VIEWS RIGHT  Result Date: 02/03/2019 Mild right hip osteoarthritis without significant degenerative changes.   Assessment/Plan Atrial fibrillation (HCC) Heart rate is in control, continue Diltiazem, Xarelto.   Parkinsonism ([place]) Moves slow, w/c for mobility, continue Sinemet  DEGENERATIVE JOINT DISEASE, RIGHT HIP Continue Tylenol, Mobic, Methocarbamol prn, see Ortho 03/08/19  Overactive bladder Urinary frequency is improved, 1-2x/night bathroom trips, continue Myrbetriq 27m since 1/821 per Urology.   Anemia Stable, Hgb 11s, continue Fe  Bilateral lower extremity edema Chronic, not tolerating diuretics well, continue Metolazone 2.522m2x/wk.   Constipation Stable, continue daily Senokot S     Family/ staff Communication: plan of care reviewed with the patient and charge nurse.   Labs/tests ordered:  none  Time spend 25 minutes.

## 2019-02-24 ENCOUNTER — Non-Acute Institutional Stay (SKILLED_NURSING_FACILITY): Payer: Medicare PPO | Admitting: Internal Medicine

## 2019-02-24 ENCOUNTER — Encounter: Payer: Self-pay | Admitting: Internal Medicine

## 2019-02-24 DIAGNOSIS — G20 Parkinson's disease: Secondary | ICD-10-CM | POA: Diagnosis not present

## 2019-02-24 DIAGNOSIS — R600 Localized edema: Secondary | ICD-10-CM | POA: Diagnosis not present

## 2019-02-24 DIAGNOSIS — I4819 Other persistent atrial fibrillation: Secondary | ICD-10-CM | POA: Diagnosis not present

## 2019-02-24 DIAGNOSIS — M25551 Pain in right hip: Secondary | ICD-10-CM | POA: Diagnosis not present

## 2019-02-24 NOTE — Progress Notes (Signed)
Location:  [facility] Room Number: 13A Place of Service:  SNF (31) Provider: Meredith Staggers L. Lyndel Safe, MD   [name], MD  Patient Care Team: [name], MD as PCP - General (Internal Medicine) [name], [name], MD as PCP - Cardiology (Cardiology) [name], [name], DO as Consulting Physician (Neurology) Mast, Man X, NP as Nurse Practitioner (Internal Medicine)  Extended Emergency Contact Information Primary Emergency Contact: [facility] Address: [address]          [place], [place] [address] [name] of [place] Phone: [phone] Mobile Phone: [phone] Relation: Son Secondary Emergency Contact: [place], [place] Mobile Phone: [phone] Relation: Daughter Interpreter needed? No  Code Status: DNR Goals of care: Advanced Directive information Advanced Directives 02/24/2019  Does Patient Have a Medical Advance Directive? Yes  Type of Paramedic of [place];Out of facility DNR (pink MOST or yellow form)  Does patient want to make changes to medical advance directive? No - Patient declined  Copy of [place] in Chart? Yes - validated most recent copy scanned in chart (See row information)  Would patient like information on creating a medical advance directive? -  Pre-existing out of facility DNR order (yellow form or pink MOST form) Yellow form placed in chart (order not valid for inpatient use)     Chief Complaint  Patient presents with  . Acute Visit    Right Hip Pain     HPI:  Pt is a 79 y.o. female seen today for an acute visit for Right Hip Pain  Patient has a history of Parkinson disease diagnosed 2 years ago on Sinemet and follows withNeurology,paroxysmal A. fib on Xarelto, hyperlipidemia, H/o Ovarian Cyst, Lower extremity edema,2 D echo with N EF Biatrial enlargementand Urinary Incontinence  She has history of right hip pain. Was recently seen by Dr Erlinda Hong Had an x-ray showed  mild right hip osteoarthritis.  And he has been for follow-up ultrasound-guided intra-articular right hip injection Patient felt good for a couple of days.  But for last few days she has been having severe pain in her right hip radiating down to her groin area.  Going little bit to the inner thigh but not below her knee. ECG she states that she has been trying to use her Andilynn more . She denies any history of falls.  Hip pain is preventing her from getting up from the bed and walking. Past Medical History:  Diagnosis Date  . Acute lower UTI 09/14/2018  . Anemia 10/15/2018   2016 colonoscopy 10/14/18 wbc 5.4, Hgb 10.1, plt 184 12/01/18 wbc 4.5, Hgb 11.1, plt 188, neutrophils 63,  Na 139, K 3.7, Bun 23, creat 0.69, eGFR 83 on Fe, Hgb 11.7 12/09/18   . Atrial fibrillation ([place]) 05/21/2010   10/07/18 Na 143, K 4.0, Bun 20, creat 0.79, eGFR 72, wbc 7.1, Hgb 10.9, plt 172 10/28/18 Na 142, K 4.0, Bun 19, creat 0.77, eGFR 74 11/18/18 Na 144, K 4.1, Bun 21, creat 0.69, eGFR 83  . Bilateral lower extremity edema 07/19/2018   11/02/18 BMP 2 weeks.   . Cervical pain (neck) 06/20/2010  . Closed fracture of part of upper end of humerus 05/01/2015  . Colles' fracture of right radius 03/05/2015  . Constipation 07/19/2018  . DEGENERATIVE JOINT DISEASE, RIGHT HIP 02/16/2007  . Dupuytren's contracture   . Dysuria 09/20/2018   09/20/18 c/o got up several times last night to go urinate, burning on urination, lower abd /back  discomfort, but urinary frequency, leakage are not new. UA C/S, Pyridium 120m tid x 2 days.  09/21/18 wbc 6.1, Hgb 11.8, plt 210, neutrophil 69.4, Na 142, K 4.1, Bun 19, creat 0.78, TP 6.4, albumin 3.9  . Hematuria 02/04/2014  . Hypotension 09/18/2018  . Long term (current) use of anticoagulants 05/29/2016  . Osteoarthritis of hip    right  . Osteoarthritis of left knee   . Overactive bladder 10/06/2018  . Parkinson disease ([place]   . Paroxysmal A-fib ([place]   . POSTMENOPAUSAL SYNDROME 02/16/2007  . 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 S/P breast biopsy, left    two o'clock position - benign  . 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 Tremor, unspecified 10/19/2015  . Unstable gait 02/16/2017  . Vaginal atrophy 10/19/2015  . Venous insufficiency   . Weakness 09/12/2018   Past Surgical History:  Procedure Laterality Date  . BREAST BIOPSY Left   . CATARACT EXTRACTION, BILATERAL      Allergies  Allergen Reactions  . Doxycycline Nausea And Vomiting  . Sulfonamide Derivatives     REACTION: HIVES    Outpatient Encounter Medications as of 02/24/2019  Medication Sig  . acetaminophen (TYLENOL) 500 MG tablet Take 1,000 mg by mouth 3 (three) times daily.  . calcium carbonate (TUMS EX) 750 MG chewable tablet Chew 1 tablet by mouth daily.  . carbidopa-levodopa (SINEMET CR) 50-200 MG tablet Take 1 tablet by mouth at bedtime.  . carbidopa-levodopa (SINEMET IR) 25-100 MG tablet Take 2 tablets by mouth 4 (four) times daily.   . Cholecalciferol (VITAMIN D3) 10 MCG (400 UNIT) CAPS Take 1 capsule by mouth daily.  .Marland Kitchendiltiazem (CARDIZEM CD) 120 MG 24 hr capsule Take 120 mg by mouth daily. HOLD IF SBP < 100  . ferrous sulfate 325 (65 FE) MG tablet Take 325 mg by mouth. Once a day on Mon, Fri  . meloxicam (MOBIC) 15 MG tablet Take 15 mg by mouth daily.  . methocarbamol (ROBAXIN) 500 MG tablet Take 250 mg by mouth 2 (two) times daily as needed for muscle spasms.  . metolazone (ZAROXOLYN) 2.5 MG tablet Take 2.5 mg by mouth. Once A Day on Mon, and Thurs. Hold if systolic blood pressure less than 100  . mirabegron ER (MYRBETRIQ) 50 MG TB24 tablet Take 50 mg by mouth daily.  . NON FORMULARY Incontinent Care: apply Vaseline to buttocks after each incontinent episode Every Shift  . NON FORMULARY NAS Special Instructions: Regular/NAS diet, thin liquids.  . rivaroxaban (XARELTO) 20 MG TABS tablet Take 20 mg by mouth at bedtime.  . sennosides-docusate sodium  (SENOKOT-S) 8.6-50 MG tablet Take 1 tablet by mouth at bedtime.  . Sodium Fluoride (CLINPRO 5000) 1.1 % PSTE Place onto teeth at bedtime.  .Marland Kitchenzinc oxide 20 % ointment Apply 1 application topically as needed for irritation. To buttocks after every incontinent episode and as needed for redness. May keep at bedside.  . [DISCONTINUED] acetaminophen (TYLENOL) 500 MG tablet Take 1,000 mg by mouth at bedtime as needed. Scheduled  . [DISCONTINUED] Acetaminophen 500 MG coapsule Take 2 capsules by mouth See admin instructions. Take 2 tablets to = 1000 mg q8h prn   No facility-administered encounter medications on file as of 02/24/2019.    Review of Systems  Constitutional: Positive for activity change.  HENT: Negative.   Respiratory: Negative.   Cardiovascular: Positive for leg swelling.  Gastrointestinal: Negative.   Genitourinary: Negative.   Musculoskeletal: Positive  for arthralgias, gait problem and myalgias.  Neurological: Positive for weakness.  Psychiatric/Behavioral: Negative.     Immunization History  Administered Date(s) Administered  . Influenza Split 10/20/2012  . Influenza Whole 10/21/2006  . Influenza, High Dose Seasonal PF 10/17/2016  . Influenza-Unspecified 10/20/2013, 10/22/2017  . Moderna SARS-COVID-2 Vaccination 01/22/2019, 02/19/2019  . Pneumococcal Conjugate-13 01/31/2013  . Pneumococcal Polysaccharide-23 01/20/2005, 11/24/2011  . Td 01/21/2004  . Tdap 03/16/2014  . Zoster 04/17/2009   Pertinent  Health Maintenance Due  Topic Date Due  . COLONOSCOPY  06/12/2019  . INFLUENZA VACCINE  Completed  . DEXA SCAN  Completed  . PNA vac Low Risk Adult  Completed   Fall Risk  05/03/2018 11/03/2017 08/04/2017 03/06/2017 11/26/2016  Falls in the past year? 1 No No Yes Yes  Number falls in past yr: 0 - - 2 or more 2 or more  Injury with Fall? 0 - - No No  Risk Factor Category  - - - High Fall Risk High Fall Risk  Risk for fall due to : Other (Comment) - - - -  Follow up Falls  evaluation completed;Education provided;Falls prevention discussed - - Falls evaluation completed Falls evaluation completed   Functional Status Survey:    Vitals:   02/24/19 1546  BP: 120/82  Pulse: 84  Resp: 18  Temp: 98.4 F (36.9 C)  TempSrc: Oral  SpO2: 97%  Weight: 134 lb 14.4 oz (61.2 kg)  Height: '5\' 8"'  (1.727 m)   Body mass index is 20.51 kg/m. Physical Exam Vitals reviewed.  Constitutional:      Appearance: Normal appearance.  HENT:     Head: Normocephalic.     Nose: Nose normal.     Mouth/Throat:     Mouth: Mucous membranes are moist.  Cardiovascular:     Rate and Rhythm: Normal rate. Rhythm irregular.     Pulses: Normal pulses.  Pulmonary:     Effort: Pulmonary effort is normal. No respiratory distress.     Breath sounds: Normal breath sounds. No wheezing.  Abdominal:     General: Abdomen is flat. Bowel sounds are normal.     Palpations: Abdomen is soft.  Musculoskeletal:        General: Swelling present.     Cervical back: Neck supple.     Comments: Patient seems very Uncomfortable due to her Pain. Not Letting me Do proper Examine on her Hip. C/O Pain with Any transfer or Any Movements  Skin:    General: Skin is warm and dry.  Neurological:     General: No focal deficit present.     Mental Status: She is alert and oriented to person, place, and time.  Psychiatric:        Mood and Affect: Mood normal.     Labs reviewed: Recent Labs    09/12/18 0304 09/12/18 0304 09/13/18 0201 09/13/18 0201 09/14/18 0043 09/21/18 0000 10/28/18 0000 11/18/18 0000 12/09/18 0000  NA 140   < > 140   < > 140   < > 142 144 140  K 3.6   < > 3.8   < > 3.9   < > 4.0 4.1 3.9  CL 108   < > 112*   < > 111   < > 104 110* 103  CO2 21*   < > 22   < > 23   < > 28 27* 29*  GLUCOSE 89  --  95  --  109*  --   --   --   --  BUN 27*   < > 24*   < > 15   < > 19 21 24*  CREATININE 0.88   < > 0.75   < > 0.68   < > 0.8 0.7 0.7  CALCIUM 9.2   < > 8.4*   < > 8.5*   < > 9.9 9.3  9.4  MG  --   --  2.0  --   --   --   --   --   --    < > = values in this interval not displayed.   Recent Labs    09/12/18 0304 09/12/18 0304 09/13/18 0201 09/21/18 0000 12/09/18 0000  AST 60*   < > 50* 20 16  ALT 7   < > '7 20 10  ' ALKPHOS 45   < > 36* 42 47  BILITOT 1.1  --  0.6  --   --   PROT 6.2*  --  4.9* 6.4  --   ALBUMIN 3.7   < > 2.8* 3.9 3.8   < > = values in this interval not displayed.   Recent Labs    09/11/18 2105 09/11/18 2105 09/12/18 0304 09/12/18 0304 09/13/18 0201 09/21/18 0000 10/07/18 0000 10/14/18 0000 12/09/18 0000  WBC 10.6*   < > 8.0   < > 8.9   < > 7.1 5.4 5.5  NEUTROABS 9.4*  --   --   --  7.1  --   --   --  3,718  HGB 12.6   < > 11.5*   < > 10.5*   < > 10.9* 10.1* 11.7*  HCT 40.1   < > 35.2*   < > 32.4*   < > 33* 31* 37  MCV 96.4  --  94.4  --  93.9  --   --   --   --   PLT 170   < > 150   < > 125*   < > 172 184 189   < > = values in this interval not displayed.   Lab Results  Component Value Date   TSH 0.408 09/11/2018   No results found for: HGBA1C Lab Results  Component Value Date   CHOL 185 12/09/2018   HDL 78 (A) 12/09/2018   LDLCALC 94 12/09/2018   LDLDIRECT 104.2 01/25/2013   TRIG 45 12/09/2018   CHOLHDL 2 08/29/2015    Significant Diagnostic Results in last 30 days:  MSK [place] - NO CHARGES  Result Date: 02/03/2019 Please see Notes tab for imaging impression.  XR HIP UNILAT W OR W/O PELVIS 2-3 VIEWS RIGHT  Result Date: 02/03/2019 Mild right hip osteoarthritis without significant degenerative changes.   Assessment/Plan   Worsening Chronic Hip Pain Will get Xray again of the Hip as it is worse No H/o Falls Start on Mobic 15 mg QD Tylenol 1000 mg TID Robaxin 250 mg BID for 1 week Nurse will Call Ortho Office to Get her Faster Appointment  Family/ staff Communication:   Labs/tests ordered:

## 2019-02-25 ENCOUNTER — Encounter: Payer: Self-pay | Admitting: Nurse Practitioner

## 2019-02-25 ENCOUNTER — Telehealth: Payer: Self-pay

## 2019-02-25 DIAGNOSIS — R100 Acute abdomen: Secondary | ICD-10-CM | POA: Diagnosis not present

## 2019-02-25 DIAGNOSIS — M25552 Pain in left hip: Secondary | ICD-10-CM | POA: Diagnosis not present

## 2019-02-25 DIAGNOSIS — M25551 Pain in right hip: Secondary | ICD-10-CM | POA: Diagnosis not present

## 2019-02-25 NOTE — Assessment & Plan Note (Signed)
Continue Tylenol, Mobic, Methocarbamol prn, see Ortho 03/08/19

## 2019-02-25 NOTE — Telephone Encounter (Signed)
Appt made sooner

## 2019-02-25 NOTE — Telephone Encounter (Signed)
[place] at [facility] would like a call back to discuss if patient can be seen earlier than 03/08/2019.  Stated that patient is having right groin pain and the Tylenol is not helping.  CB# [phone].  Please advise.  Thank you.

## 2019-02-25 NOTE — Assessment & Plan Note (Signed)
Stable, Hgb 11s, continue Fe 

## 2019-02-25 NOTE — Assessment & Plan Note (Signed)
Stable, continue daily Senokot S

## 2019-02-25 NOTE — Assessment & Plan Note (Signed)
Heart rate is in control, continue Diltiazem, Xarelto.

## 2019-02-25 NOTE — Telephone Encounter (Signed)
Ok to do

## 2019-02-25 NOTE — Assessment & Plan Note (Signed)
Moves slow, w/c for mobility, continue Sinemet

## 2019-02-25 NOTE — Assessment & Plan Note (Signed)
Urinary frequency is improved, 1-2x/night bathroom trips, continue Myrbetriq 50mg  since 1/821 per Urology.

## 2019-02-25 NOTE — Assessment & Plan Note (Signed)
Chronic, not tolerating diuretics well, continue Metolazone 2.5mg  2x/wk.

## 2019-02-28 DIAGNOSIS — Z20828 Contact with and (suspected) exposure to other viral communicable diseases: Secondary | ICD-10-CM | POA: Diagnosis not present

## 2019-03-01 ENCOUNTER — Other Ambulatory Visit: Payer: Self-pay

## 2019-03-01 ENCOUNTER — Ambulatory Visit (INDEPENDENT_AMBULATORY_CARE_PROVIDER_SITE_OTHER): Payer: Medicare PPO | Admitting: Orthopaedic Surgery

## 2019-03-01 DIAGNOSIS — M25551 Pain in right hip: Secondary | ICD-10-CM

## 2019-03-01 NOTE — Progress Notes (Signed)
Office Visit Note   Patient: Stephanie Shaw           Date of Birth: October 16, 1940           MRN: [id number] Visit Date: 03/01/2019              Requested by: [name], MD [place],  [place] [id number] PCP: [name], MD   Assessment & Plan: Visit Diagnoses:  1. Pain in right hip     Plan: Impression is chronic right hip pain with temporary relief from cortisone injection.  At this point we will obtain MRI of the right hip to rule out structural abnormalities.  Follow-up after the MRI.  Follow-Up Instructions: Return in about 2 weeks (around 03/15/2019).   Orders:  No orders of the defined types were placed in this encounter.  No orders of the defined types were placed in this encounter.     Procedures: No procedures performed   Clinical Data: No additional findings.   Subjective: Chief Complaint  Patient presents with  . Right Hip - Follow-up    Patient returns today for follow-up of right hip pain.  Previous cortisone injection helped temporarily.  She states that she is still having a lot of pain.   Review of Systems  Constitutional: Negative.   HENT: Negative.   Eyes: Negative.   Respiratory: Negative.   Cardiovascular: Negative.   Endocrine: Negative.   Musculoskeletal: Negative.   Neurological: Negative.   Hematological: Negative.   Psychiatric/Behavioral: Negative.   All other systems reviewed and are negative.    Objective: Vital Signs: There were no vitals taken for this visit.  Physical Exam Vitals and nursing note reviewed.  Constitutional:      Appearance: She is well-developed.  Pulmonary:     Effort: Pulmonary effort is normal.  Skin:    General: Skin is warm.     Capillary Refill: Capillary refill takes less than 2 seconds.  Neurological:     Mental Status: She is alert and oriented to person, place, and time.  Psychiatric:        Behavior: Behavior normal.        Thought Content: Thought content  normal.        Judgment: Judgment normal.     Ortho Exam Right hip exam is unchanged. Specialty Comments:  No specialty comments available.  Imaging: No results found.   PMFS History: Patient Active Problem List   Diagnosis Date Noted  . Urge incontinence of urine 11/10/2018  . Anemia 10/15/2018  . Overactive bladder 10/06/2018  . Hypotension 09/18/2018  . Adnexal cyst 09/15/2018  . 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 Rhabdomyolysis 09/13/2018  . Weakness 09/12/2018  . Constipation 07/19/2018  . Bilateral lower extremity edema 07/19/2018  . 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 Unstable gait 02/16/2017  . Parkinsonism ([place]) 10/17/2016  . Left wrist pain 01/03/2016  . Vaginal atrophy 10/19/2015  . Tremor, unspecified 10/19/2015  . Osteopenia 10/19/2015  . 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 Closed fracture of part of upper end of humerus 05/01/2015  . Hematuria 02/04/2014  . 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 Cervical pain (neck) 06/20/2010  . Atrial fibrillation ([place]) 05/21/2010  . DEGENERATIVE JOINT DISEASE, RIGHT HIP 02/16/2007   Past Medical History:  Diagnosis Date  . Acute lower UTI 09/14/2018  . Anemia 10/15/2018   2016 colonoscopy 10/14/18 wbc 5.4, Hgb 10.1,  plt 184 12/01/18 wbc 4.5, Hgb 11.1, plt 188, neutrophils 63,  Na 139, K 3.7, Bun 23, creat 0.69, eGFR 83 on Fe, Hgb 11.7 12/09/18   . Atrial fibrillation ([place]) 05/21/2010   10/07/18 Na 143, K 4.0, Bun 20, creat 0.79, eGFR 72, wbc 7.1, Hgb 10.9, plt 172 10/28/18 Na 142, K 4.0, Bun 19, creat 0.77, eGFR 74 11/18/18 Na 144, K 4.1, Bun 21, creat 0.69, eGFR 83  . Bilateral lower extremity edema 07/19/2018   11/02/18 BMP 2 weeks.   . Cervical pain (neck) 06/20/2010  . Closed fracture of part of upper end of humerus 05/01/2015  . Colles' fracture of right radius 03/05/2015  . Constipation 07/19/2018  . DEGENERATIVE JOINT DISEASE, RIGHT  HIP 02/16/2007  . Dupuytren's contracture   . Dysuria 09/20/2018   09/20/18 c/o got up several times last night to go urinate, burning on urination, lower abd /back discomfort, but urinary frequency, leakage are not new. UA C/S, Pyridium 191m tid x 2 days.  09/21/18 wbc 6.1, Hgb 11.8, plt 210, neutrophil 69.4, Na 142, K 4.1, Bun 19, creat 0.78, TP 6.4, albumin 3.9  . Hematuria 02/04/2014  . Hypotension 09/18/2018  . Long term (current) use of anticoagulants 05/29/2016  . Osteoarthritis of hip    right  . Osteoarthritis of left knee   . Overactive bladder 10/06/2018  . Parkinson disease ([place]   . Paroxysmal A-fib ([place]   . POSTMENOPAUSAL SYNDROME 02/16/2007  . PREMATURE ATRIAL CONTRACTIONS 02/16/2007  . 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 S/P breast biopsy, left    two o'clock position - benign  . 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 Tremor, unspecified 10/19/2015  . Unstable gait 02/16/2017  . Vaginal atrophy 10/19/2015  . Venous insufficiency   . Weakness 09/12/2018    Family History  Problem Relation Age of Onset  . Cancer Father        ? type  . Alcohol abuse Father   . Cancer Mother        Pancreatic  . Diabetes Mother   . Heart failure Mother   . Osteoarthritis Sister   . Healthy Sister   . Healthy Child   . Breast cancer Neg Hx     Past Surgical History:  Procedure Laterality Date  . BREAST BIOPSY Left   . CATARACT EXTRACTION, BILATERAL     Social History   Occupational History  . Occupation: retired    Comment: math, [employer]  Tobacco Use  . Smoking status: Former Smoker    Quit date: 08/27/1971    Years since quitting: 47.5  . Smokeless tobacco: Never Used  Substance and Sexual Activity  . Alcohol use: No    Alcohol/week: 0.0 standard drinks  . Drug use: No  . Sexual activity: Not Currently    Comment: 1st intercourse 79 yo-Fewer than 5 partners

## 2019-03-02 ENCOUNTER — Encounter: Payer: Self-pay | Admitting: Nurse Practitioner

## 2019-03-02 ENCOUNTER — Non-Acute Institutional Stay (SKILLED_NURSING_FACILITY): Payer: Medicare PPO | Admitting: Nurse Practitioner

## 2019-03-02 ENCOUNTER — Other Ambulatory Visit: Payer: Self-pay | Admitting: Nurse Practitioner

## 2019-03-02 DIAGNOSIS — G20C Parkinsonism, unspecified: Secondary | ICD-10-CM

## 2019-03-02 DIAGNOSIS — N3281 Overactive bladder: Secondary | ICD-10-CM | POA: Diagnosis not present

## 2019-03-02 DIAGNOSIS — M25551 Pain in right hip: Secondary | ICD-10-CM

## 2019-03-02 DIAGNOSIS — I8393 Asymptomatic varicose veins of bilateral lower extremities: Secondary | ICD-10-CM

## 2019-03-02 DIAGNOSIS — G20 Parkinson's disease: Secondary | ICD-10-CM | POA: Diagnosis not present

## 2019-03-02 DIAGNOSIS — I4819 Other persistent atrial fibrillation: Secondary | ICD-10-CM

## 2019-03-02 MED ORDER — HYDROCODONE-ACETAMINOPHEN 5-325 MG PO TABS: 0.5000 | ORAL_TABLET | ORAL | 0 refills | Status: DC | PRN

## 2019-03-02 NOTE — Addendum Note (Signed)
Addended by: Precious Bard on: 03/02/2019 11:17 AM   Modules accepted: Orders

## 2019-03-02 NOTE — Assessment & Plan Note (Addendum)
03/02/19 right hip pain, travels to the right groin, right inner thigh, to the right knee, positional, on Tylenol, Mobic, saw Ortho yesterday, has X-ray, pending MRI , adding Norco 5/325mg  1/2 q4hr prn x 5 days. Start MiraLax daily to prevent Narcotic related constipation

## 2019-03-02 NOTE — Assessment & Plan Note (Signed)
Moves slow, w/c for mobility, continue Sinemet.

## 2019-03-02 NOTE — Progress Notes (Signed)
Location:   [facility] Room Number: 13 Place of Service:  SNF (31) Provider:  Mast, Man, NP [name], MD  Patient Care Team: [name], MD as PCP - General (Internal Medicine) [name], [name], MD as PCP - Cardiology (Cardiology) [name], [name], DO as Consulting Physician (Neurology) Mast, Man X, NP as Nurse Practitioner (Internal Medicine)  Extended Emergency Contact Information Primary Emergency Contact: [facility] Address: [address]          [place], [place] [address] [name] of [place] Phone: [phone] Mobile Phone: [phone] Relation: Son Secondary Emergency Contact: Haroldine, [place] Mobile Phone: [phone] Relation: Daughter Interpreter needed? No  Code Status:  DNR Goals of care: Advanced Directive information Advanced Directives 03/02/2019  Does Patient Have a Medical Advance Directive? Yes  Type of Paramedic of [place];Out of facility DNR (pink MOST or yellow form)  Does patient want to make changes to medical advance directive? No - Patient declined  Copy of [place] in Chart? Yes - validated most recent copy scanned in chart (See row information)  Would patient like information on creating a medical advance directive? -  Pre-existing out of facility DNR order (yellow form or pink MOST form) -     Chief Complaint  Patient presents with  . Acute Visit    Right Hip Pain    HPI:  Pt is a 79 y.o. female seen today for an acute visit for worsened the right hip pain, travels to the right groin, inner thigh, to the medial right knee, positional, on Tylenol, Mobic, saw Ortho yesterday, has X-ray, pending MRI. Hx of Parkinson's, moves slow, w/c for mobility, on Sinemet. BLE edema, Metolazone 2.30m 2x/wk. Afib, heart rate is in control, on Xarelto 274mqd, Diltiazem 12041md.    Past Medical History:  Diagnosis Date  . Acute lower UTI 09/14/2018  . Anemia 10/15/2018    2016 colonoscopy 10/14/18 wbc 5.4, Hgb 10.1, plt 184 12/01/18 wbc 4.5, Hgb 11.1, plt 188, neutrophils 63,  Na 139, K 3.7, Bun 23, creat 0.69, eGFR 83 on Fe, Hgb 11.7 12/09/18   . Atrial fibrillation ([place]/01/2010   10/07/18 Na 143, K 4.0, Bun 20, creat 0.79, eGFR 72, wbc 7.1, Hgb 10.9, plt 172 10/28/18 Na 142, K 4.0, Bun 19, creat 0.77, eGFR 74 11/18/18 Na 144, K 4.1, Bun 21, creat 0.69, eGFR 83  . Bilateral lower extremity edema 07/19/2018   11/02/18 BMP 2 weeks.   . Cervical pain (neck) 06/20/2010  . Closed fracture of part of upper end of humerus 05/01/2015  . Colles' fracture of right radius 03/05/2015  . Constipation 07/19/2018  . DEGENERATIVE JOINT DISEASE, RIGHT HIP 02/16/2007  . Dupuytren's contracture   . Dysuria 09/20/2018   09/20/18 c/o got up several times last night to go urinate, burning on urination, lower abd /back discomfort, but urinary frequency, leakage are not new. UA C/S, Pyridium 100m13md x 2 days.  09/21/18 wbc 6.1, Hgb 11.8, plt 210, neutrophil 69.4, Na 142, K 4.1, Bun 19, creat 0.78, TP 6.4, albumin 3.9  . Hematuria 02/04/2014  . Hypotension 09/18/2018  . Long term (current) use of anticoagulants 05/29/2016  . Osteoarthritis of hip    right  . Osteoarthritis of left knee   . Overactive bladder 10/06/2018  . Parkinson disease (HCC)[place]. Paroxysmal A-fib (HCC)[place]. POSTMENOPAUSAL SYNDROME 02/16/2007  . PREMATURE ATRIAL CONTRACTIONS 02/16/2007  . 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 S/P breast biopsy, left    two o'clock position - benign  . 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 Tremor, unspecified 10/19/2015  . Unstable gait 02/16/2017  . Vaginal atrophy 10/19/2015  . Venous insufficiency   . Weakness 09/12/2018   Past Surgical History:  Procedure Laterality Date  . BREAST BIOPSY Left   . CATARACT EXTRACTION, BILATERAL      Allergies  Allergen Reactions  . Doxycycline Nausea And Vomiting  . Sulfonamide Derivatives     REACTION: HIVES    Allergies  as of 03/02/2019      Reactions   Doxycycline Nausea And Vomiting   Sulfonamide Derivatives    REACTION: HIVES      Medication List       Accurate as of March 02, 2019  3:28 PM. If you have any questions, ask your nurse or doctor.        STOP taking these medications   methocarbamol 500 MG tablet Commonly known as: ROBAXIN Stopped by: Man X Mast, NP   NON FORMULARY Stopped by: Man X Mast, NP   NON FORMULARY Stopped by: Man X Mast, NP     TAKE these medications   acetaminophen 500 MG tablet Commonly known as: TYLENOL Take 500 mg by mouth 3 (three) times daily. DO NOT EXCEED 1,081m. Take at 5am,12pm, and 8pm.   calcium carbonate 750 MG chewable tablet Commonly known as: TUMS EX Chew 1 tablet by mouth daily. Between the hours of 7am-10am.   carbidopa-levodopa 25-100 MG tablet Commonly known as: SINEMET IR Take 2 tablets by mouth in the morning, at noon, in the evening, and at bedtime. Morning: 7am, MidDay 11am, Afternoon 3pm, Evening 7pm.   carbidopa-levodopa 50-200 MG tablet Commonly known as: SINEMET CR Take 1 tablet by mouth at bedtime. 9pm   Clinpro 5000 1.1 % Pste Generic drug: Sodium Fluoride Place onto teeth at bedtime. Between the hours of 7pm-10pm.   diltiazem 120 MG 24 hr capsule Commonly known as: CARDIZEM CD Take 120 mg by mouth daily. Between the hours of 7am-10am. HOLD IF SBP < 100   ferrous sulfate 325 (65 FE) MG tablet Take 325 mg by mouth. Once a day on Mon, Fri   meloxicam 15 MG tablet Commonly known as: MOBIC Take 15 mg by mouth daily. Between the hours of 7am-10am.   metolazone 2.5 MG tablet Commonly known as: ZAROXOLYN Take 2.5 mg by mouth. Once A Day on Mon, and Thurs at 7:30am. . Hold if systolic blood pressure less than 100   Myrbetriq 50 MG Tb24 tablet Generic drug: mirabegron ER Take 50 mg by mouth daily. Between the hours of 7am-10am.   rivaroxaban 20 MG Tabs tablet Commonly known as: XARELTO Take 20 mg by mouth at  bedtime. Between the hours of 5pm-6pm.   sennosides-docusate sodium 8.6-50 MG tablet Commonly known as: SENOKOT-S Take 1 tablet by mouth at bedtime. Between the hours of 7pm-10pm.   Vitamin D3 10 MCG (400 UNIT) Caps Take 1 capsule by mouth daily. Between the hours of 7am-10am.   zinc oxide 20 % ointment Apply 1 application topically as needed for irritation. To buttocks after every incontinent episode and as needed for redness. May keep at bedside.       Review of Systems  Constitutional: Negative for activity change, appetite change, chills, diaphoresis, fatigue and fever.  HENT: Positive for hearing loss. Negative for congestion and voice change.   Eyes: Negative for visual disturbance.  Respiratory: Negative for cough, shortness of breath and wheezing.   Cardiovascular: Positive for leg swelling. Negative for chest pain and palpitations.  Gastrointestinal: Negative for abdominal distention, abdominal pain, constipation, diarrhea, nausea and vomiting.  Genitourinary: Negative for difficulty urinating, dysuria and urgency.  Musculoskeletal: Positive for arthralgias, gait problem and myalgias.       Right hip pain, radiating to the right groin/inner thigh/medial knee.   Skin: Negative for color change and pallor.  Neurological: Negative for dizziness, facial asymmetry, speech difficulty, numbness and headaches.       Moves slow.   Psychiatric/Behavioral: Negative for agitation, behavioral problems, hallucinations and sleep disturbance. The patient is not nervous/anxious.     Immunization History  Administered Date(s) Administered  . Influenza Split 10/20/2012  . Influenza Whole 10/21/2006  . Influenza, High Dose Seasonal PF 10/17/2016  . Influenza-Unspecified 10/20/2013, 10/22/2017  . Moderna SARS-COVID-2 Vaccination 01/22/2019, 02/19/2019  . Pneumococcal Conjugate-13 01/31/2013  . Pneumococcal Polysaccharide-23 01/20/2005, 11/24/2011  . Td 01/21/2004  . Tdap 03/16/2014  .  Zoster 04/17/2009   Pertinent  Health Maintenance Due  Topic Date Due  . COLONOSCOPY  06/12/2019  . INFLUENZA VACCINE  Completed  . DEXA SCAN  Completed  . PNA vac Low Risk Adult  Completed   Fall Risk  05/03/2018 11/03/2017 08/04/2017 03/06/2017 11/26/2016  Falls in the past year? 1 No No Yes Yes  Number falls in past yr: 0 - - 2 or more 2 or more  Injury with Fall? 0 - - No No  Risk Factor Category  - - - High Fall Risk High Fall Risk  Risk for fall due to : Other (Comment) - - - -  Follow up Falls evaluation completed;Education provided;Falls prevention discussed - - Falls evaluation completed Falls evaluation completed   Functional Status Survey:    Vitals:   03/02/19 1231  BP: 122/60  Pulse: 88  Resp: 18  Temp: 97.9 F (36.6 C)  SpO2: 97%  Weight: 134 lb 3.2 oz (60.9 kg)  Height: _0  (1.727 m)   Body mass index is 20.41 kg/m. Physical Exam Vitals and nursing note reviewed.  Constitutional:      General: She is not in acute distress.    Appearance: Normal appearance. She is not ill-appearing, toxic-appearing or diaphoretic.  HENT:     Head: Normocephalic and atraumatic.     Nose: Nose normal.     Mouth/Throat:     Mouth: Mucous membranes are moist.  Eyes:     Extraocular Movements: Extraocular movements intact.     Conjunctiva/sclera: Conjunctivae normal.     Pupils: Pupils are equal, round, and reactive to light.  Cardiovascular:     Rate and Rhythm: Normal rate. Rhythm irregular.     Heart sounds: No murmur.  Pulmonary:     Breath sounds: No wheezing, rhonchi or rales.  Abdominal:     General: Bowel sounds are normal. There is no distension.     Palpations: Abdomen is soft.     Tenderness: There is no abdominal tenderness. There is no right CVA tenderness, left CVA tenderness, guarding or rebound.  Musculoskeletal:     Cervical back: Normal range of motion and neck supple.     Right lower leg: Edema present.     Left lower leg: Edema present.      Comments: 1+ edema BLE. Right hip pain, positional, palpated. Stated her pain travels to her right groin, R inner thigh, and R medial knee with any movement.  Skin:    General: Skin is warm and dry.  Neurological:     General: No focal deficit present.     Mental Status: She is alert and oriented to person, place, and time. Mental status is at baseline.     Motor: No weakness.     Coordination: Coordination abnormal.     Gait: Gait abnormal.     Comments: Moves slow, w/c for positioning and mobility.   Psychiatric:        Mood and Affect: Mood normal.        Behavior: Behavior normal.        Thought Content: Thought content normal.     Labs reviewed: Recent Labs    09/12/18 0304 09/12/18 0304 09/13/18 0201 09/13/18 0201 09/14/18 0043 09/21/18 0000 10/28/18 0000 11/18/18 0000 12/09/18 0000  NA 140   < > 140   < > 140   < > 142 144 140  K 3.6   < > 3.8   < > 3.9   < > 4.0 4.1 3.9  CL 108   < > 112*   < > 111   < > 104 110* 103  CO2 21*   < > 22   < > 23   < > 28 27* 29*  GLUCOSE 89  --  95  --  109*  --   --   --   --   BUN 27*   < > 24*   < > 15   < > 19 21 24*  CREATININE 0.88   < > 0.75   < > 0.68   < > 0.8 0.7 0.7  CALCIUM 9.2   < > 8.4*   < > 8.5*   < > 9.9 9.3 9.4  MG  --   --  2.0  --   --   --   --   --   --    < > = values in this interval not displayed.   Recent Labs    09/12/18 0304 09/12/18 0304 09/13/18 0201 09/21/18 0000 12/09/18 0000  AST 60*   < > 50* 20 16  ALT 7   < > _0 ALKPHOS 45   < > 36* 42 47  BILITOT 1.1  --  0.6  --   --   PROT 6.2*  --  4.9* 6.4  --   ALBUMIN 3.7   < > 2.8* 3.9 3.8   < > = values in this interval not displayed.   Recent Labs    09/11/18 2105 09/11/18 2105 09/12/18 0304 09/12/18 0304 09/13/18 0201 09/21/18 0000 10/07/18 0000 10/14/18 0000 12/09/18 0000  WBC 10.6*   < > 8.0   < > 8.9   < > 7.1 5.4 5.5  NEUTROABS 9.4*  --   --   --  7.1  --   --   --  3,718  HGB 12.6   < > 11.5*   < > 10.5*   < > 10.9*  10.1* 11.7*  HCT 40.1   < > 35.2*   < > 32.4*   < > 33* 31* 37  MCV 96.4  --  94.4  --  93.9  --   --   --   --   PLT 170   < > 150   < > 125*   < > 172 184 189   < > = values in this interval not displayed.   Lab  Results  Component Value Date   TSH 0.408 09/11/2018   No results found for: HGBA1C Lab Results  Component Value Date   CHOL 185 12/09/2018   HDL 78 (A) 12/09/2018   LDLCALC 94 12/09/2018   LDLDIRECT 104.2 01/25/2013   TRIG 45 12/09/2018   CHOLHDL 2 08/29/2015    Significant Diagnostic Results in last 30 days:  MSK [place] - NO CHARGES  Result Date: 02/03/2019 Please see Notes tab for imaging impression.  XR HIP UNILAT W OR W/O PELVIS 2-3 VIEWS RIGHT  Result Date: 02/03/2019 Mild right hip osteoarthritis without significant degenerative changes.   Assessment/Plan Right hip pain 03/02/19 right hip pain, travels to the right groin, right inner thigh, to the right knee, positional, on Tylenol, Mobic, saw Ortho yesterday, has X-ray, pending MRI , adding Norco 5/334m 1/2 q4hr prn x 5 days. Start MiraLax daily to prevent Narcotic related constipation    Overactive bladder Better, continue Myrbetriq  Atrial fibrillation (HCC) Heart rate is in control, continue Diltiazem, Xarelto  Varicose veins of both lower extremities BLE edema, chronic, Echo EF normal in the past, continue Metolazone 2x/wk  Parkinsonism (HCC) Moves slow, w/c for mobility, continue Sinemet.     Family/ staff Communication: plan of care reviewed with the patient and charge nurse.   Labs/tests ordered:  none  Time spend 25 minutes.

## 2019-03-02 NOTE — Assessment & Plan Note (Signed)
BLE edema, chronic, Echo EF normal in the past, continue Metolazone 2x/wk

## 2019-03-02 NOTE — Assessment & Plan Note (Signed)
Heart rate is in control, continue Diltiazem, Xarelto

## 2019-03-02 NOTE — Assessment & Plan Note (Signed)
Better, continue Myrbetriq

## 2019-03-03 ENCOUNTER — Telehealth: Payer: Self-pay | Admitting: *Deleted

## 2019-03-03 NOTE — Telephone Encounter (Signed)
Daughter Baldo Ash called left vm checking status of referral for MRI, I tried calling back vm is full.

## 2019-03-07 ENCOUNTER — Ambulatory Visit
Admission: RE | Admit: 2019-03-07 | Discharge: 2019-03-07 | Disposition: A | Discharge status: Home / Self Care | Payer: Medicare PPO | Source: Ambulatory Visit | Attending: Orthopaedic Surgery | Admitting: Orthopaedic Surgery

## 2019-03-07 ENCOUNTER — Other Ambulatory Visit: Payer: Self-pay

## 2019-03-07 DIAGNOSIS — M25551 Pain in right hip: Secondary | ICD-10-CM

## 2019-03-07 DIAGNOSIS — Z20828 Contact with and (suspected) exposure to other viral communicable diseases: Secondary | ICD-10-CM | POA: Diagnosis not present

## 2019-03-08 ENCOUNTER — Ambulatory Visit: Payer: Medicare PPO | Admitting: Orthopaedic Surgery

## 2019-03-11 DIAGNOSIS — R350 Frequency of micturition: Secondary | ICD-10-CM | POA: Diagnosis not present

## 2019-03-11 DIAGNOSIS — N3946 Mixed incontinence: Secondary | ICD-10-CM | POA: Diagnosis not present

## 2019-03-15 ENCOUNTER — Ambulatory Visit: Payer: Medicare PPO | Admitting: Orthopaedic Surgery

## 2019-03-15 ENCOUNTER — Ambulatory Visit: Payer: Self-pay

## 2019-03-15 ENCOUNTER — Ambulatory Visit: Payer: Medicare Other | Admitting: Neurology

## 2019-03-15 ENCOUNTER — Other Ambulatory Visit: Payer: Self-pay

## 2019-03-15 ENCOUNTER — Encounter: Payer: Self-pay | Admitting: Orthopaedic Surgery

## 2019-03-15 VITALS — Ht 68.0 in | Wt 134.0 lb

## 2019-03-15 DIAGNOSIS — M25551 Pain in right hip: Secondary | ICD-10-CM | POA: Insufficient documentation

## 2019-03-15 NOTE — Addendum Note (Signed)
Addended by: Marlyne Beards on: 03/15/2019 11:27 AM   Modules accepted: Orders

## 2019-03-15 NOTE — Progress Notes (Signed)
Office Visit Note   Patient: Stephanie Shaw           Date of Birth: [birth year]           MRN: [id number] Visit Date: 03/15/2019              Requested by: [name], MD [address] [place],  [place] [id number] PCP: [name], MD   Assessment & Plan: Visit Diagnoses:  1. 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  These findings were reviewed with the patient in detail and based on our discussion we will try an iliopsoas injection today with Dr. Junius Roads.  Patient instructed to follow-up in about 2 to 4 weeks if she does not notice any improvement.  She lives at friend's home permanently but she states that she has not been able to ambulate with a Peggy for the last month due to the right hip pain.  She has a son that lives here in town.  Follow-Up Instructions: Return in about 4 weeks (around 04/12/2019).   Orders:  No orders of the defined types were placed in this encounter.  No orders of the defined types were placed in this encounter.     Procedures: No procedures performed   Clinical Data: No additional findings.   Subjective: Chief Complaint  Patient presents with  . Right Hip - Follow-up    MRI results    Patient returns today for MRI review on the right hip.  She continues to have right hip pain that is limiting her ability to walk.  Previous cortisone injection of the hip joint provided her with 5 days of relief during which time she was able to ambulate with a Naya.  Unfortunately the pain has returned.   Review of Systems  Constitutional: Negative.   HENT: Negative.   Eyes: Negative.   Respiratory: Negative.   Cardiovascular: Negative.   Endocrine: Negative.   Musculoskeletal: Negative.   Neurological: Negative.   Hematological: Negative.   Psychiatric/Behavioral: Negative.   All other systems reviewed and are  negative.    Objective: Vital Signs: Ht '5\' 8"'  (1.727 m)   Wt 134 lb (60.8 kg)   BMI 20.37 kg/m   Physical Exam Vitals and nursing note reviewed.  Constitutional:      Appearance: She is well-developed.  Pulmonary:     Effort: Pulmonary effort is normal.  Skin:    General: Skin is warm.     Capillary Refill: Capillary refill takes less than 2 seconds.  Neurological:     Mental Status: She is alert and oriented to person, place, and time.  Psychiatric:        Behavior: Behavior normal.        Thought Content: Thought content normal.        Judgment: Judgment normal.     Ortho Exam Right hip exam is unchanged.  She has pain with hip flexion and she has significant weakness secondary to pain Specialty Comments:  No specialty comments available.  Imaging: No results found.   PMFS History: Patient Active Problem List   Diagnosis Date Noted  . Pain in right hip 03/15/2019  . Right hip pain 03/02/2019  . Urge incontinence of urine 11/10/2018  . Anemia 10/15/2018  . Overactive bladder 10/06/2018  . Hypotension 09/18/2018  . Adnexal cyst 09/15/2018  . 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 Rhabdomyolysis 09/13/2018  . Weakness 09/12/2018  . Constipation 07/19/2018  . Bilateral lower extremity edema 07/19/2018  . 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 Unstable gait 02/16/2017  . Parkinsonism ([place]) 10/17/2016  . Left wrist pain 01/03/2016  . Vaginal atrophy 10/19/2015  . Tremor, unspecified 10/19/2015  . Osteopenia 10/19/2015  . 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 Closed fracture of part of upper end of humerus 05/01/2015  . Hematuria 02/04/2014  . 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 Cervical pain (neck) 06/20/2010  . Atrial fibrillation ([place]) 05/21/2010  . DEGENERATIVE JOINT DISEASE, RIGHT HIP 02/16/2007   Past Medical History:  Diagnosis Date  . Acute lower UTI 09/14/2018  .  Anemia 10/15/2018   2016 colonoscopy 10/14/18 wbc 5.4, Hgb 10.1, plt 184 12/01/18 wbc 4.5, Hgb 11.1, plt 188, neutrophils 63,  Na 139, K 3.7, Bun 23, creat 0.69, eGFR 83 on Fe, Hgb 11.7 12/09/18   . Atrial fibrillation ([place]) 05/21/2010   10/07/18 Na 143, K 4.0, Bun 20, creat 0.79, eGFR 72, wbc 7.1, Hgb 10.9, plt 172 10/28/18 Na 142, K 4.0, Bun 19, creat 0.77, eGFR 74 11/18/18 Na 144, K 4.1, Bun 21, creat 0.69, eGFR 83  . Bilateral lower extremity edema 07/19/2018   11/02/18 BMP 2 weeks.   . Cervical pain (neck) 06/20/2010  . Closed fracture of part of upper end of humerus 05/01/2015  . Colles' fracture of right radius 03/05/2015  . Constipation 07/19/2018  . DEGENERATIVE JOINT DISEASE, RIGHT HIP 02/16/2007  . Dupuytren's contracture   . Dysuria 09/20/2018   09/20/18 c/o got up several times last night to go urinate, burning on urination, lower abd /back discomfort, but urinary frequency, leakage are not new. UA C/S, Pyridium 157m tid x 2 days.  09/21/18 wbc 6.1, Hgb 11.8, plt 210, neutrophil 69.4, Na 142, K 4.1, Bun 19, creat 0.78, TP 6.4, albumin 3.9  . Hematuria 02/04/2014  . Hypotension 09/18/2018  . Long term (current) use of anticoagulants 05/29/2016  . Osteoarthritis of hip    right  . Osteoarthritis of left knee   . Overactive bladder 10/06/2018  . Parkinson disease ([place]   . Paroxysmal A-fib ([place]   . POSTMENOPAUSAL SYNDROME 02/16/2007  . PREMATURE ATRIAL CONTRACTIONS 02/16/2007  . 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 S/P breast biopsy, left    two o'clock position - benign  . 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 Tremor, unspecified 10/19/2015  . Unstable gait 02/16/2017  . Vaginal atrophy 10/19/2015  . Venous insufficiency   . Weakness 09/12/2018    Family History  Problem Relation Age of Onset  . Cancer Father        ? type  . Alcohol abuse Father   . Cancer Mother        Pancreatic  . Diabetes Mother   . Heart failure Mother   . Osteoarthritis Sister   . Healthy  Sister   . Healthy Child   . Breast cancer Neg Hx     Past Surgical History:  Procedure Laterality Date  . BREAST BIOPSY Left   . CATARACT EXTRACTION, BILATERAL     Social History   Occupational History  . Occupation: retired    Comment: math, [employer]  Tobacco Use  . Smoking status: Former Smoker    Quit date: 08/27/1971    Years since quitting: 47.5  . Smokeless tobacco: Never Used  Substance and Sexual Activity  .  Alcohol use: No    Alcohol/week: 0.0 standard drinks  . Drug use: No  . Sexual activity: Not Currently    Comment: 1st intercourse 79 yo-Fewer than 5 partners

## 2019-03-17 ENCOUNTER — Other Ambulatory Visit: Payer: Medicare PPO

## 2019-03-22 ENCOUNTER — Encounter: Payer: Self-pay | Admitting: Internal Medicine

## 2019-03-22 ENCOUNTER — Encounter: Payer: Self-pay | Admitting: Orthopaedic Surgery

## 2019-03-22 ENCOUNTER — Encounter: Payer: Self-pay | Admitting: Neurology

## 2019-03-22 ENCOUNTER — Non-Acute Institutional Stay (SKILLED_NURSING_FACILITY): Payer: Medicare PPO | Admitting: Internal Medicine

## 2019-03-22 DIAGNOSIS — G20 Parkinson's disease: Secondary | ICD-10-CM

## 2019-03-22 DIAGNOSIS — I4819 Other persistent atrial fibrillation: Secondary | ICD-10-CM

## 2019-03-22 DIAGNOSIS — M25551 Pain in right hip: Secondary | ICD-10-CM | POA: Diagnosis not present

## 2019-03-22 DIAGNOSIS — R600 Localized edema: Secondary | ICD-10-CM

## 2019-03-22 NOTE — Progress Notes (Signed)
Location: [facility] Room Number: 13-A Place of Service:  SNF (31)  Provider:   Code Status:  Goals of Care:  Advanced Directives 03/22/2019  Does Patient Have a Medical Advance Directive? Yes  Type of Advance Directive [place]  Does patient want to make changes to medical advance directive? No - Patient declined  Copy of [place] in Chart? Yes - validated most recent copy scanned in chart (See row information)  Would patient like information on creating a medical advance directive? -  Pre-existing out of facility DNR order (yellow form or pink MOST form) -     Chief Complaint  Patient presents with   Acute Visit    Patient is seen for hip pain.    HPI: Patient is a 79 y.o. female seen today for an acute visit for Right Hip pain and therapy Patient has a history of Parkinson disease diagnosed 2 years ago on Sinemet and follows withNeurology,paroxysmal A. fib on Xarelto, hyperlipidemia, H/o Ovarian Cyst, Lower extremity edema,2 D echo with N EF Biatrial enlargementand Urinary Incontinence  Has Chronic Right Hip Pain. 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 Underwent Steroid injection  In Iliopsoas. Her Pain got better but then now it is back and patient is worried that she will need Right Hip Replacement Patient says her pain is back but controlled on Mobic and Tylenol Not on Norco and Robaxin anymore She is doing well other wise. Did not have any other Acute issues  Past Medical History:  Diagnosis Date   Acute lower UTI 09/14/2018   Anemia 10/15/2018   2016 colonoscopy 10/14/18 wbc 5.4, Hgb 10.1, plt 184 12/01/18 wbc 4.5, Hgb 11.1, plt 188, neutrophils 63,  Na 139, K 3.7, Bun 23, creat 0.69, eGFR 83 on Fe, Hgb 11.7 12/09/18    Atrial fibrillation ([facility]) 05/21/2010   10/07/18 Na 143, K 4.0, Bun 20, creat 0.79, eGFR 72, wbc 7.1, Hgb 10.9, plt 172 10/28/18 Na  142, K 4.0, Bun 19, creat 0.77, eGFR 74 11/18/18 Na 144, K 4.1, Bun 21, creat 0.69, eGFR 83   Bilateral lower extremity edema 07/19/2018   11/02/18 BMP 2 weeks.    Cervical pain (neck) 06/20/2010   Closed fracture of part of upper end of humerus 05/01/2015   Colles' fracture of right radius 03/05/2015   Constipation 07/19/2018   DEGENERATIVE JOINT DISEASE, RIGHT HIP 02/16/2007   Dupuytren's contracture    Dysuria 09/20/2018   09/20/18 c/o got up several times last night to go urinate, burning on urination, lower abd /back discomfort, but urinary frequency, leakage are not new. UA C/S, Pyridium 160m tid x 2 days.  09/21/18 wbc 6.1, Hgb 11.8, plt 210, neutrophil 69.4, Na 142, K 4.1, Bun 19, creat 0.78, TP 6.4, albumin 3.9   Hematuria 02/04/2014   Hypotension 09/18/2018   Long term (current) use of anticoagulants 05/29/2016   Osteoarthritis of hip    right   Osteoarthritis of left knee    Overactive bladder 10/06/2018   Parkinson disease ([place]    Paroxysmal A-fib ([place]    POSTMENOPAUSAL SYNDROME 02/16/2007   PREMATURE ATRIAL CONTRACTIONS 02/16/2007   Primary osteoarthritis of right shoulder 04/27/2017   S/P breast biopsy, left    two o'clock position - benign   Toxic effect of venom(989.5) 07/27/2007   Tremor, unspecified 10/19/2015   Unstable gait 02/16/2017   Vaginal atrophy 10/19/2015   Venous insufficiency  Weakness 09/12/2018    Past Surgical History:  Procedure Laterality Date   BREAST BIOPSY Left    CATARACT EXTRACTION, BILATERAL      Allergies  Allergen Reactions   Doxycycline Nausea And Vomiting   Sulfonamide Derivatives     REACTION: HIVES    Outpatient Encounter Medications as of 03/22/2019  Medication Sig   acetaminophen (TYLENOL) 500 MG tablet Take 500 mg by mouth See admin instructions. DO NOT EXCEED 3,052m. Take at 5am,12pm, and 8pm scheduled.  May take 1000 mg q8h prn.   calcium carbonate (TUMS EX) 750 MG chewable tablet Chew 1 tablet by mouth  daily. Between the hours of 7am-10am.   carbidopa-levodopa (SINEMET CR) 50-200 MG tablet Take 1 tablet by mouth at bedtime. 9pm   carbidopa-levodopa (SINEMET IR) 25-100 MG tablet Take 2 tablets by mouth in the morning, at noon, in the evening, and at bedtime. Morning: 7am, MidDay 11am, Afternoon 3pm, Evening 7pm.   Cholecalciferol (VITAMIN D3) 10 MCG (400 UNIT) CAPS Take 1 capsule by mouth daily. Between the hours of 7am-10am.   diltiazem (CARDIZEM CD) 120 MG 24 hr capsule Take 120 mg by mouth daily. Between the hours of 7am-10am. HOLD IF SBP < 100   ferrous sulfate 325 (65 FE) MG tablet Take 325 mg by mouth. Once a day on Mon, Fri   meloxicam (MOBIC) 15 MG tablet Take 15 mg by mouth daily. Between the hours of 7am-10am.   metolazone (ZAROXOLYN) 2.5 MG tablet Take 2.5 mg by mouth. Once A Day on Mon, and Thurs at 7:30am. . Hold if systolic blood pressure less than 100   mirabegron ER (MYRBETRIQ) 50 MG TB24 tablet Take 50 mg by mouth daily. Between the hours of 7am-10am.   polyethylene glycol (MIRALAX / GLYCOLAX) 17 g packet Take 17 g by mouth daily.   rivaroxaban (XARELTO) 20 MG TABS tablet Take 20 mg by mouth every evening. Between the hours of 5pm-6pm.   sennosides-docusate sodium (SENOKOT-S) 8.6-50 MG tablet Take 1 tablet by mouth at bedtime. Between the hours of 7pm-10pm.   Sodium Fluoride (CLINPRO 5000) 1.1 % PSTE Place onto teeth at bedtime. Between the hours of 7pm-10pm.   zinc oxide 20 % ointment Apply 1 application topically as needed for irritation. To buttocks after every incontinent episode and as needed for redness. May keep at bedside.   No facility-administered encounter medications on file as of 03/22/2019.    Review of Systems:  Review of Systems  Constitutional: Positive for activity change.  HENT: Negative.   Respiratory: Negative.   Cardiovascular: Positive for leg swelling.  Gastrointestinal: Negative.   Genitourinary: Negative.   Musculoskeletal: Positive  for arthralgias, gait problem and myalgias.  Neurological: Positive for weakness. Negative for dizziness.  Psychiatric/Behavioral: Negative.     Health Maintenance  Topic Date Due   COLONOSCOPY  06/12/2019   TETANUS/TDAP  03/16/2024   INFLUENZA VACCINE  Completed   DEXA SCAN  Completed   PNA vac Low Risk Adult  Completed    Physical Exam: Vitals:   03/22/19 1631  BP: (!) 80/60  Pulse: 80  Resp: 18  Temp: (!) 97.3 F (36.3 C)  TempSrc: Oral  SpO2: 98%  Weight: 137 lb 6.4 oz (62.3 kg)  Height: '5\' 8"'  (1.727 m)   Body mass index is 20.89 kg/m. Physical Exam Vitals reviewed.  Constitutional:      Appearance: Normal appearance.  HENT:     Head: Normocephalic.     Nose: Nose normal.  Mouth/Throat:     Mouth: Mucous membranes are moist.     Pharynx: Oropharynx is clear.  Eyes:     Pupils: Pupils are equal, round, and reactive to light.  Cardiovascular:     Rate and Rhythm: Normal rate. Rhythm irregular.     Pulses: Normal pulses.  Pulmonary:     Effort: Pulmonary effort is normal.     Breath sounds: Normal breath sounds.  Abdominal:     General: Abdomen is flat. Bowel sounds are normal.     Palpations: Abdomen is soft.  Musculoskeletal:        General: Swelling present.     Cervical back: Neck supple.  Skin:    General: Skin is warm.  Neurological:     Mental Status: She is alert and oriented to person, place, and time.  Psychiatric:        Mood and Affect: Mood normal.     Labs reviewed: Basic Metabolic Panel: Recent Labs    09/11/18 2105 09/11/18 2105 09/12/18 0304 09/12/18 0304 09/13/18 0201 09/13/18 0201 09/14/18 0043 09/21/18 0000 10/28/18 0000 11/18/18 0000 12/09/18 0000  NA 139   < > 140   < > 140   < > 140   < > 142 144 140  K 3.7   < > 3.6   < > 3.8   < > 3.9   < > 4.0 4.1 3.9  CL 106   < > 108   < > 112*   < > 111   < > 104 110* 103  CO2 22   < > 21*   < > 22   < > 23   < > 28 27* 29*  GLUCOSE 99   < > 89  --  95  --  109*  --    --   --   --   BUN 29*   < > 27*   < > 24*   < > 15   < > 19 21 24*  CREATININE 0.98   < > 0.88   < > 0.75   < > 0.68   < > 0.8 0.7 0.7  CALCIUM 9.9   < > 9.2   < > 8.4*   < > 8.5*   < > 9.9 9.3 9.4  MG  --   --   --   --  2.0  --   --   --   --   --   --   TSH 0.408  --   --   --   --   --   --   --   --   --   --    < > = values in this interval not displayed.   Liver Function Tests: Recent Labs    09/12/18 0304 09/12/18 0304 09/13/18 0201 09/21/18 0000 12/09/18 0000  AST 60*   < > 50* 20 16  ALT 7   < > '7 20 10  ' ALKPHOS 45   < > 36* 42 47  BILITOT 1.1  --  0.6  --   --   PROT 6.2*  --  4.9* 6.4  --   ALBUMIN 3.7   < > 2.8* 3.9 3.8   < > = values in this interval not displayed.   No results for input(s): LIPASE, AMYLASE in the last 8760 hours. No results for input(s): AMMONIA in the last 8760 hours. CBC: Recent Labs    09/11/18 2105 09/11/18  2105 09/12/18 0304 09/12/18 0304 09/13/18 0201 09/21/18 0000 10/07/18 0000 10/14/18 0000 12/09/18 0000  WBC 10.6*   < > 8.0   < > 8.9   < > 7.1 5.4 5.5  NEUTROABS 9.4*  --   --   --  7.1  --   --   --  3,718  HGB 12.6   < > 11.5*   < > 10.5*   < > 10.9* 10.1* 11.7*  HCT 40.1   < > 35.2*   < > 32.4*   < > 33* 31* 37  MCV 96.4  --  94.4  --  93.9  --   --   --   --   PLT 170   < > 150   < > 125*   < > 172 184 189   < > = values in this interval not displayed.   Lipid Panel: Recent Labs    12/09/18 0000  CHOL 185  HDL 78*  LDLCALC 94  TRIG 45   No results found for: HGBA1C  Procedures since last visit: MR Hip Right w/o contrast  Result Date: 03/07/2019 CLINICAL DATA:  Chronic right hip pain. EXAM: MR OF THE RIGHT HIP WITHOUT CONTRAST TECHNIQUE: Multiplanar, multisequence MR imaging was performed. No intravenous contrast was administered. COMPARISON:  None. FINDINGS: Both hips are normally located. Moderate degenerative changes bilaterally but no stress fracture or AVN. No hip joint effusion. No periarticular fluid  collections to suggest a paralabral cyst. Mild bilateral peritendinosis most notably involving the right gluteus medius tendon. No findings for trochanteric bursitis. The pubic symphysis and SI joints are intact. No pelvic fractures or bone lesions. High-grade partial-thickness tearing of the iliopsoas tendon distally. No complete tear/rupture but possibly at risk for such. Mild edema like signal changes in the gluteus minimus and medius tendons suggesting mild muscle strain or partial tear. The hamstring tendons are intact. No significant intrapelvic abnormalities are identified. Small benign-appearing cystic structure superior to the bladder. IMPRESSION: 1. Moderate bilateral hip joint degenerative changes but no stress fracture or AVN. 2. No significant intrapelvic abnormalities. 3. High-grade partial-thickness tearing of the iliopsoas tendon distally. No complete tear/rupture but possibly at risk for such. 4. Mild bilateral peritendinosis most notably involving the right gluteus medius tendon. Electronically Signed   By: Marijo Sanes M.D.   On: 03/07/2019 15:03   US Guided Needle Placement - No Linked Charges  Result Date: 03/15/2019 Please see Notes tab for imaging impression.   Assessment/Plan  Right hip pain D/W the Patient  Her Pain is controlled with Tylenol and Mobic right now She has Appointment with Dr Erlinda Hong in few weeks Will not do any changes to her meds   Parkinson's disease ([place]) Symptoms seems to be controlled on Sinemet  Persistent atrial fibrillation ([place]) Doing well on Cardizem and Xarelto Bilateral lower extremity edema Continue Metalazone Repeat BMP Anemia On Iron and B12  Repeat CBC Labs/tests ordered:  * No order type specified * Next appt:  Visit date not found

## 2019-03-23 ENCOUNTER — Telehealth: Payer: Self-pay | Admitting: Neurology

## 2019-03-23 NOTE — Telephone Encounter (Signed)
I have reached out to the pt via my chart advising of this message.

## 2019-03-23 NOTE — Telephone Encounter (Signed)
Please call patient's daughter back: Having Parkinson's disease is not a contraindication for surgery. However, patients with Parkinson's disease can have a longer recovery time and some setback with their Parkinson's symptoms, typically linked to irregularities to their medication timing and also due to having to take pain medication and secondary to temporarily being more immobile. Again, from my end of things, there is no absolute contraindication for her to pursue necessary hip surgery.

## 2019-03-23 NOTE — Telephone Encounter (Signed)
Xu, I am not sure how to exactly how to answer all of these questions in regards to illiopsoas

## 2019-03-24 ENCOUNTER — Telehealth: Payer: Self-pay | Admitting: Neurology

## 2019-03-24 DIAGNOSIS — I10 Essential (primary) hypertension: Secondary | ICD-10-CM | POA: Diagnosis not present

## 2019-03-24 DIAGNOSIS — E785 Hyperlipidemia, unspecified: Secondary | ICD-10-CM | POA: Diagnosis not present

## 2019-03-24 DIAGNOSIS — D649 Anemia, unspecified: Secondary | ICD-10-CM | POA: Diagnosis not present

## 2019-03-24 DIAGNOSIS — E876 Hypokalemia: Secondary | ICD-10-CM | POA: Diagnosis not present

## 2019-03-24 LAB — LIPID PANEL
Cholesterol: 185 (ref 0–200)
HDL: 79 — AB (ref 35–70)
LDL Cholesterol: 94
LDl/HDL Ratio: 2.3
Triglycerides: 41 (ref 40–160)

## 2019-03-24 LAB — BASIC METABOLIC PANEL
BUN: 26 — AB (ref 4–21)
CO2: 28 — AB (ref 13–22)
Chloride: 107 (ref 99–108)
Creatinine: 0.7 (ref 0.5–1.1)
Glucose: 90
Potassium: 4.4 (ref 3.4–5.3)
Sodium: 141 (ref 137–147)

## 2019-03-24 LAB — HEPATIC FUNCTION PANEL
ALT: 5 — AB (ref 7–35)
AST: 11 — AB (ref 13–35)
Alkaline Phosphatase: 50 (ref 25–125)
Bilirubin, Total: 0.5

## 2019-03-24 LAB — COMPREHENSIVE METABOLIC PANEL
Albumin: 4 (ref 3.5–5.0)
Calcium: 9.5 (ref 8.7–10.7)
Globulin: 2.4

## 2019-03-24 LAB — CBC AND DIFFERENTIAL
HCT: 34 — AB (ref 36–46)
Hemoglobin: 10.9 — AB (ref 12.0–16.0)
Neutrophils Absolute: 6602
Platelets: 190 (ref 150–399)
WBC: 8.4

## 2019-03-24 LAB — CBC: RBC: 3.49 — AB (ref 3.87–5.11)

## 2019-03-24 NOTE — Telephone Encounter (Signed)
Please call patient's daughter re: FU questions about pt's surgery (see my chart message for reference), anesthesia and perioperative care. These questions are best handled by her surgical team and the anesthesiologist and I will have to defer to the expertise of her specialists.

## 2019-03-24 NOTE — Telephone Encounter (Signed)
I have sent a my chart advising of this message.

## 2019-03-28 ENCOUNTER — Encounter: Payer: Self-pay | Admitting: Internal Medicine

## 2019-03-28 ENCOUNTER — Non-Acute Institutional Stay (SKILLED_NURSING_FACILITY): Payer: Medicare PPO | Admitting: Nurse Practitioner

## 2019-03-28 ENCOUNTER — Encounter: Payer: Self-pay | Admitting: Nurse Practitioner

## 2019-03-28 DIAGNOSIS — M62838 Other muscle spasm: Secondary | ICD-10-CM

## 2019-03-28 DIAGNOSIS — I4819 Other persistent atrial fibrillation: Secondary | ICD-10-CM | POA: Diagnosis not present

## 2019-03-28 DIAGNOSIS — M25551 Pain in right hip: Secondary | ICD-10-CM | POA: Diagnosis not present

## 2019-03-28 DIAGNOSIS — G20 Parkinson's disease: Secondary | ICD-10-CM

## 2019-03-28 NOTE — Progress Notes (Signed)
Location:   [facility] Room Number: [place] of Service:  SNF (31) Provider:  Kimiyah Blick NP  [name], MD  Patient Care Team: [name], MD as PCP - General (Internal Medicine) [name], [name], MD as PCP - Cardiology (Cardiology) [name], [name], DO as Consulting Physician (Neurology) Jadaya Sommerfield X, NP as Nurse Practitioner (Internal Medicine)  Extended Emergency Contact Information Primary Emergency Contact: Alexiana, Laverdure Mobile Phone: [phone] Relation: Daughter Interpreter needed? No Secondary Emergency Contact: [facility] Address: [address]          [place], [place] [address] [name] of [place] Phone: [phone] Mobile Phone: [phone] Relation: Son  Code Status:  DNR Goals of care: Advanced Directive information Advanced Directives 03/28/2019  Does Patient Have a Medical Advance Directive? Yes  Type of Paramedic of [place];Out of facility DNR (pink MOST or yellow form)  Does patient want to make changes to medical advance directive? No - Patient declined  Copy of [place] in Chart? Yes - validated most recent copy scanned in chart (See row information)  Would patient like information on creating a medical advance directive? -  Pre-existing out of facility DNR order (yellow form or pink MOST form) -     Chief Complaint  Patient presents with   Acute Visit    Leg Spasms and Hip Pain    HPI:  Pt is a 79 y.o. female seen today for an acute visit for persisted right hip pain, muscle spasm in the right leg, mostly positional, Tylenol 1080m tid, Meloxicam 164mqd are not effective. Hx of Parkinson's on Sinemet. Afib, heart rates is in control, on Diltiazem 12066md, Xarelto 59m70m.   03/07/19 MRI R hip 1. Moderate bilateral hip joint degenerative changes but no stress fracture or AVN. 2. No significant intrapelvic abnormalities. 3. High-grade partial-thickness  tearing of the iliopsoas tendon distally. No complete tear/rupture but possibly at risk for such. 4. Mild bilateral peritendinosis most notably involving the right gluteus medius tendon.   Past Medical History:  Diagnosis Date   Acute lower UTI 09/14/2018   Anemia 10/15/2018   2016 colonoscopy 10/14/18 wbc 5.4, Hgb 10.1, plt 184 12/01/18 wbc 4.5, Hgb 11.1, plt 188, neutrophils 63,  Na 139, K 3.7, Bun 23, creat 0.69, eGFR 83 on Fe, Hgb 11.7 12/09/18    Atrial fibrillation (HCC)[place]1/2012   10/07/18 Na 143, K 4.0, Bun 20, creat 0.79, eGFR 72, wbc 7.1, Hgb 10.9, plt 172 10/28/18 Na 142, K 4.0, Bun 19, creat 0.77, eGFR 74 11/18/18 Na 144, K 4.1, Bun 21, creat 0.69, eGFR 83   Bilateral lower extremity edema 07/19/2018   11/02/18 BMP 2 weeks.    Cervical pain (neck) 06/20/2010   Closed fracture of part of upper end of humerus 05/01/2015   Colles' fracture of right radius 03/05/2015   Constipation 07/19/2018   DEGENERATIVE JOINT DISEASE, RIGHT HIP 02/16/2007   Dupuytren's contracture    Dysuria 09/20/2018   09/20/18 c/o got up several times last night to go urinate, burning on urination, lower abd /back discomfort, but urinary frequency, leakage are not new. UA C/S, Pyridium 100mg29m x 2 days.  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   S/P breast biopsy, left    two o'clock position - benign   Toxic effect of venom(989.5) 07/27/2007   Tremor, unspecified 10/19/2015   Unstable gait 02/16/2017   Vaginal atrophy 10/19/2015   Venous  insufficiency    Weakness 09/12/2018   Past Surgical History:  Procedure Laterality Date   BREAST BIOPSY Left    CATARACT EXTRACTION, BILATERAL      Allergies  Allergen Reactions   Doxycycline Nausea And Vomiting   Sulfonamide Derivatives     REACTION: HIVES    Allergies as of 03/28/2019      Reactions   Doxycycline Nausea And Vomiting   Sulfonamide Derivatives    REACTION: HIVES      Medication List       Accurate as of March 28, 2019 11:59 PM. If you have any questions, ask your nurse or doctor.        acetaminophen 500 MG tablet Commonly known as: TYLENOL Take 1,000 mg by mouth 3 (three) times daily. Not to exceed 3000 mg in 24 ( scheduled + Prn doses   acetaminophen 500 MG tablet Commonly known as: TYLENOL Take 500 mg by mouth every 8 (eight) hours as needed. Not to exceed 3 gm in 24 hours between scheduled and prn doses   calcium carbonate 750 MG chewable tablet Commonly known as: TUMS EX Chew 1 tablet by mouth daily. Between the hours of 7am-10am.   carbidopa-levodopa 25-100 MG tablet Commonly known as: SINEMET IR Take 2 tablets by mouth in the morning, at noon, in the evening, and at bedtime. Morning: 7am, MidDay 11am, Afternoon 3pm, Evening 7pm.   carbidopa-levodopa 50-200 MG tablet Commonly known as: SINEMET CR Take 1 tablet by mouth at bedtime. 9pm   Clinpro 5000 1.1 % Pste Generic drug: Sodium Fluoride Place onto teeth at bedtime. Between the hours of 7pm-10pm.   diltiazem 120 MG 24 hr capsule Commonly known as: CARDIZEM CD Take 120 mg by mouth daily. Between the hours of 7am-10am. HOLD IF SBP < 100   ferrous sulfate 325 (65 FE) MG tablet Take 325 mg by mouth. Once a day on Mon, Fri   HYDROcodone-acetaminophen 5-325 MG tablet Commonly known as: NORCO/VICODIN Give 1/2 tablet by mouth every 4 hours prn for right hip pain X 14 days   meloxicam 15 MG tablet Commonly known as: MOBIC Take 15 mg by mouth daily. Between the hours of 7am-10am.     methocarbamol 500 MG tablet Commonly known as: ROBAXIN Take 500 mg by mouth every 4 (four) hours as needed.   metolazone 2.5 MG tablet Commonly known as: ZAROXOLYN Take 2.5 mg by mouth. Once A Day on Mon, and Thurs at 7:30am. . Hold if systolic blood pressure less than 100   Myrbetriq 50 MG Tb24 tablet Generic drug: mirabegron ER Take 50 mg by mouth daily. Between the hours of 7am-10am.   polyethylene glycol 17 g packet Commonly known as: MIRALAX / GLYCOLAX Take 17 g by mouth daily.   rivaroxaban 20 MG Tabs tablet Commonly known as: XARELTO Take 20 mg by mouth every evening. Between the hours of 5pm-6pm.   sennosides-docusate sodium 8.6-50 MG tablet Commonly known as: SENOKOT-S Take 1 tablet by mouth at bedtime. Between the hours  of 7pm-10pm.   Vitamin D3 10 MCG (400 UNIT) Caps Take 1 capsule by mouth daily. Between the hours of 7am-10am.   zinc oxide 20 % ointment Apply 1 application topically as needed for irritation. To buttocks after every incontinent episode and as needed for redness. May keep at bedside.       Review of Systems  Constitutional: Negative for activity change, appetite change, fatigue and fever.  HENT: Positive for hearing loss. Negative for congestion and voice change.   Eyes: Negative for visual disturbance.  Respiratory: Negative for cough, shortness of breath and wheezing.   Cardiovascular: Positive for leg swelling. Negative for chest pain and palpitations.  Gastrointestinal: Negative for abdominal distention, abdominal pain, constipation, nausea and vomiting.  Genitourinary: Negative for difficulty urinating, dysuria and urgency.  Musculoskeletal: Positive for arthralgias, gait problem and myalgias.       Right hip pain, radiating to the right groin/inner thigh/medial knee.   Skin: Negative for color change.  Neurological: Negative for dizziness, speech difficulty, numbness and headaches.       Moves slow.   Psychiatric/Behavioral: Negative  for agitation, behavioral problems and sleep disturbance. The patient is not nervous/anxious.     Immunization History  Administered Date(s) Administered   Influenza Split 10/20/2012   Influenza Whole 10/21/2006   Influenza, High Dose Seasonal PF 10/17/2016   Influenza-Unspecified 10/20/2013, 10/22/2017   Moderna SARS-COVID-2 Vaccination 01/22/2019, 02/19/2019   Pneumococcal Conjugate-13 01/31/2013   Pneumococcal Polysaccharide-23 01/20/2005, 11/24/2011   Td 01/21/2004   Tdap 03/16/2014   Zoster 04/17/2009   Pertinent  Health Maintenance Due  Topic Date Due   COLONOSCOPY  06/12/2019   INFLUENZA VACCINE  Completed   DEXA SCAN  Completed   PNA vac Low Risk Adult  Completed   Fall Risk  05/03/2018 11/03/2017 08/04/2017 03/06/2017 11/26/2016  Falls in the past year? 1 No No Yes Yes  Number falls in past yr: 0 - - 2 or more 2 or more  Injury with Fall? 0 - - No No  Risk Factor Category  - - - High Fall Risk High Fall Risk  Risk for fall due to : Other (Comment) - - - -  Follow up Falls evaluation completed;Education provided;Falls prevention discussed - - Falls evaluation completed Falls evaluation completed   Functional Status Survey:    Vitals:   03/28/19 1444  BP: 118/72  Pulse: 80  Resp: 18  Temp: (!) 97.1 F (36.2 C)  SpO2: 97%  Weight: 135 lb 3.2 oz (61.3 kg)  Height: '5\' 8"'  (1.727 m)   Body mass index is 20.56 kg/m. Physical Exam Vitals and nursing note reviewed.  Constitutional:      General: She is not in acute distress.    Appearance: Normal appearance. She is not ill-appearing, toxic-appearing or diaphoretic.  HENT:     Head: Normocephalic and atraumatic.     Nose: Nose normal.     Mouth/Throat:     Mouth: Mucous membranes are moist.  Eyes:     Extraocular Movements: Extraocular movements intact.     Conjunctiva/sclera: Conjunctivae normal.     Pupils: Pupils are equal, round, and reactive to light.  Cardiovascular:     Rate and Rhythm:  Normal rate. Rhythm irregular.     Heart sounds: No murmur.  Pulmonary:     Breath sounds: No wheezing, rhonchi or rales.  Abdominal:     General: Bowel sounds are normal. There is no distension.     Palpations: Abdomen is soft.  Tenderness: There is no abdominal tenderness. There is no right CVA tenderness, left CVA tenderness, guarding or rebound.  Musculoskeletal:     Cervical back: Normal range of motion and neck supple.     Right lower leg: Edema present.     Left lower leg: Edema present.     Comments: 1+ edema BLE. Right hip pain, positional, palpated. Stated her pain travels to her right groin, R inner thigh, and R medial knee with any movement. Right leg muscle spasm.   Skin:    General: Skin is warm and dry.  Neurological:     General: No focal deficit present.     Mental Status: She is alert and oriented to person, place, and time. Mental status is at baseline.     Motor: No weakness.     Coordination: Coordination abnormal.     Gait: Gait abnormal.     Comments: Moves slow, w/c for positioning and mobility.   Psychiatric:        Mood and Affect: Mood normal.        Behavior: Behavior normal.        Thought Content: Thought content normal.     Labs reviewed: Recent Labs    09/12/18 0304 09/12/18 0304 09/13/18 0201 09/13/18 0201 09/14/18 0043 09/21/18 0000 10/28/18 0000 11/18/18 0000 12/09/18 0000  NA 140   < > 140   < > 140   < > 142 144 140  K 3.6   < > 3.8   < > 3.9   < > 4.0 4.1 3.9  CL 108   < > 112*   < > 111   < > 104 110* 103  CO2 21*   < > 22   < > 23   < > 28 27* 29*  GLUCOSE 89  --  95  --  109*  --   --   --   --   BUN 27*   < > 24*   < > 15   < > 19 21 24*  CREATININE 0.88   < > 0.75   < > 0.68   < > 0.8 0.7 0.7  CALCIUM 9.2   < > 8.4*   < > 8.5*   < > 9.9 9.3 9.4  MG  --   --  2.0  --   --   --   --   --   --    < > = values in this interval not displayed.   Recent Labs    09/12/18 0304 09/12/18 0304 09/13/18 0201 09/21/18 0000  12/09/18 0000  AST 60*   < > 50* 20 16  ALT 7   < > '7 20 10  ' ALKPHOS 45   < > 36* 42 47  BILITOT 1.1  --  0.6  --   --   PROT 6.2*  --  4.9* 6.4  --   ALBUMIN 3.7   < > 2.8* 3.9 3.8   < > = values in this interval not displayed.   Recent Labs    09/11/18 2105 09/11/18 2105 09/12/18 0304 09/12/18 0304 09/13/18 0201 09/21/18 0000 10/07/18 0000 10/14/18 0000 12/09/18 0000  WBC 10.6*   < > 8.0   < > 8.9   < > 7.1 5.4 5.5  NEUTROABS 9.4*  --   --   --  7.1  --   --   --  3,718  HGB 12.6   < > 11.5*   < >  10.5*   < > 10.9* 10.1* 11.7*  HCT 40.1   < > 35.2*   < > 32.4*   < > 33* 31* 37  MCV 96.4  --  94.4  --  93.9  --   --   --   --   PLT 170   < > 150   < > 125*   < > 172 184 189   < > = values in this interval not displayed.   Lab Results  Component Value Date   TSH 0.408 09/11/2018   No results found for: HGBA1C Lab Results  Component Value Date   CHOL 185 12/09/2018   HDL 78 (A) 12/09/2018   LDLCALC 94 12/09/2018   LDLDIRECT 104.2 01/25/2013   TRIG 45 12/09/2018   CHOLHDL 2 08/29/2015    Significant Diagnostic Results in last 30 days:  MR Hip Right w/o contrast  Result Date: 03/07/2019 CLINICAL DATA:  Chronic right hip pain. EXAM: MR OF THE RIGHT HIP WITHOUT CONTRAST TECHNIQUE: Multiplanar, multisequence MR imaging was performed. No intravenous contrast was administered. COMPARISON:  None. FINDINGS: Both hips are normally located. Moderate degenerative changes bilaterally but no stress fracture or AVN. No hip joint effusion. No periarticular fluid collections to suggest a paralabral cyst. Mild bilateral peritendinosis most notably involving the right gluteus medius tendon. No findings for trochanteric bursitis. The pubic symphysis and SI joints are intact. No pelvic fractures or bone lesions. High-grade partial-thickness tearing of the iliopsoas tendon distally. No complete tear/rupture but possibly at risk for such. Mild edema like signal changes in the gluteus minimus  and medius tendons suggesting mild muscle strain or partial tear. The hamstring tendons are intact. No significant intrapelvic abnormalities are identified. Small benign-appearing cystic structure superior to the bladder. IMPRESSION: 1. Moderate bilateral hip joint degenerative changes but no stress fracture or AVN. 2. No significant intrapelvic abnormalities. 3. High-grade partial-thickness tearing of the iliopsoas tendon distally. No complete tear/rupture but possibly at risk for such. 4. Mild bilateral peritendinosis most notably involving the right gluteus medius tendon. Electronically Signed   By: Marijo Sanes M.D.   On: 03/07/2019 15:03   US Guided Needle Placement - No Linked Charges  Result Date: 03/15/2019 Please see Notes tab for imaging impression.   Assessment/Plan Pain in right hip 03/07/19 MRI R hip 1. Moderate bilateral hip joint degenerative changes but no stress fracture or AVN. 2. No significant intrapelvic abnormalities. 3. High-grade partial-thickness tearing of the iliopsoas tendon distally. No complete tear/rupture but possibly at risk for such. 4. Mild bilateral peritendinosis most notably involving the right gluteus medius tendon. Continue Tylenol, Meloxicam, will have Norco prn available to her. Observe.   Atrial fibrillation (HCC) Heart rate is in controled, continue Diltiazem, Xarelto.   Parkinsonism ([place]) Less walking in setting of right hip pain, continue Sinemet.   Muscle spasm Muscle spasm right leg, will have Methocarbamol available to her. Observe.      Family/ staff Communication: plan of care reviewed with the patient and charge nurse.   Labs/tests ordered:  none  Time spend 25 minutes.

## 2019-03-29 ENCOUNTER — Encounter: Payer: Self-pay | Admitting: Nurse Practitioner

## 2019-03-29 DIAGNOSIS — M62838 Other muscle spasm: Secondary | ICD-10-CM | POA: Insufficient documentation

## 2019-03-29 NOTE — Assessment & Plan Note (Signed)
Muscle spasm right leg, will have Methocarbamol available to her. Observe.

## 2019-03-29 NOTE — Assessment & Plan Note (Signed)
Heart rate is in controled, continue Diltiazem, Xarelto.

## 2019-03-29 NOTE — Assessment & Plan Note (Signed)
Less walking in setting of right hip pain, continue Sinemet.

## 2019-03-29 NOTE — Assessment & Plan Note (Signed)
03/07/19 MRI R hip 1. Moderate bilateral hip joint degenerative changes but no stress fracture or AVN. 2. No significant intrapelvic abnormalities. 3. High-grade partial-thickness tearing of the iliopsoas tendon distally. No complete tear/rupture but possibly at risk for such. 4. Mild bilateral peritendinosis most notably involving the right gluteus medius tendon. Continue Tylenol, Meloxicam, will have Norco prn available to her. Observe.

## 2019-03-30 DIAGNOSIS — Z20828 Contact with and (suspected) exposure to other viral communicable diseases: Secondary | ICD-10-CM | POA: Diagnosis not present

## 2019-03-31 ENCOUNTER — Encounter: Payer: Self-pay | Admitting: Nurse Practitioner

## 2019-03-31 ENCOUNTER — Non-Acute Institutional Stay (SKILLED_NURSING_FACILITY): Payer: Medicare PPO | Admitting: Internal Medicine

## 2019-03-31 ENCOUNTER — Encounter: Payer: Self-pay | Admitting: Internal Medicine

## 2019-03-31 DIAGNOSIS — D649 Anemia, unspecified: Secondary | ICD-10-CM

## 2019-03-31 DIAGNOSIS — R600 Localized edema: Secondary | ICD-10-CM | POA: Diagnosis not present

## 2019-03-31 DIAGNOSIS — M25551 Pain in right hip: Secondary | ICD-10-CM

## 2019-03-31 DIAGNOSIS — G20 Parkinson's disease: Secondary | ICD-10-CM | POA: Diagnosis not present

## 2019-03-31 DIAGNOSIS — N3281 Overactive bladder: Secondary | ICD-10-CM | POA: Diagnosis not present

## 2019-03-31 DIAGNOSIS — K625 Hemorrhage of anus and rectum: Secondary | ICD-10-CM | POA: Diagnosis not present

## 2019-03-31 DIAGNOSIS — D6869 Other thrombophilia: Secondary | ICD-10-CM | POA: Diagnosis not present

## 2019-03-31 DIAGNOSIS — K649 Unspecified hemorrhoids: Secondary | ICD-10-CM

## 2019-03-31 DIAGNOSIS — R262 Difficulty in walking, not elsewhere classified: Secondary | ICD-10-CM | POA: Diagnosis not present

## 2019-03-31 NOTE — Progress Notes (Signed)
Location:   [facility] Room Number: [place] of Service:  SNF [phone]) Provider: [name], MD  [name], MD  Patient Care Team: [name], MD as PCP - General (Internal Medicine) [name], [name], MD as PCP - Cardiology (Cardiology) [name], [name], DO as Consulting Physician (Neurology) Mast, Man X, NP as Nurse Practitioner (Internal Medicine)  Extended Emergency Contact Information Primary Emergency Contact: Jazmarie, Bie[place] Mobile Phone: [phone] Relation: Daughter Interpreter needed? No Secondary Emergency Contact: [facility] Address: [address]          [place], [place] [address] [name] of [place] Phone: [phone] Mobile Phone: [phone] Relation: Son  Code Status:  DNR Goals of care: Advanced Directive information Advanced Directives 03/31/2019  Does Patient Have a Medical Advance Directive? Yes  Type of Advance Directive Out of facility DNR (pink MOST or yellow form);Healthcare Power of Attorney  Does patient want to make changes to medical advance directive? No - Patient declined  Copy of [place] in Chart? -  Would patient like information on creating a medical advance directive? -  Pre-existing out of facility DNR order (yellow form or pink MOST form) -     Chief Complaint  Patient presents with  . Acute Visit    Rectal Bleed    HPI:  Pt is a 79 y.o. female seen today for an acute visit for Rectal Bleed   Patient has a history of Parkinson disease diagnosed 2 years ago on Sinemet and follows withNeurology,paroxysmal A. fib on Xarelto, hyperlipidemia, H/o Ovarian Cyst, Lower extremity edema,2 D echo with N EF Biatrial enlargementand Urinary Incontinence  Patient is seen today she had 2 episodes in which nurses noticed some red blood on her diaper and then they wiped her.  It seems like it was very  small amount of Red  blood.  Patient did get 2 or 3 Norco for her pain  control and has been constipated  last few days.  She does have a history of hemorrhoids in the past.  Patient is also on Xarelto. No abdominal pain no  melena. Patient is following with Ortho for her right hip pain.  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  She has failed steroid injections. Was started on baclofen recently by Ortho  Past Medical History:  Diagnosis Date  . Acute lower UTI 09/14/2018  . Anemia 10/15/2018   2016 colonoscopy 10/14/18 wbc 5.4, Hgb 10.1, plt 184 12/01/18 wbc 4.5, Hgb 11.1, plt 188, neutrophils 63,  Na 139, K 3.7, Bun 23, creat 0.69, eGFR 83 on Fe, Hgb 11.7 12/09/18   . Atrial fibrillation ([place]) 05/21/2010   10/07/18 Na 143, K 4.0, Bun 20, creat 0.79, eGFR 72, wbc 7.1, Hgb 10.9, plt 172 10/28/18 Na 142, K 4.0, Bun 19, creat 0.77, eGFR 74 11/18/18 Na 144, K 4.1, Bun 21, creat 0.69, eGFR 83  . Bilateral lower extremity edema 07/19/2018   11/02/18 BMP 2 weeks.   . Cervical pain (neck) 06/20/2010  . Closed fracture of part of upper end of humerus 05/01/2015  . Colles' fracture of right radius 03/05/2015  . Constipation 07/19/2018  . DEGENERATIVE JOINT DISEASE, RIGHT HIP 02/16/2007  . Dupuytren's contracture   . Dysuria 09/20/2018   09/20/18 c/o got up several times last night to go urinate, burning on urination, lower abd /back discomfort, but urinary frequency, leakage are not new. UA C/S, Pyridium 170m  tid x 2 days.  09/21/18 wbc 6.1, Hgb 11.8, plt 210, neutrophil 69.4, Na 142, K 4.1, Bun 19, creat 0.78, TP 6.4, albumin 3.9  . Hematuria 02/04/2014  . Hypotension 09/18/2018  . Long term (current) use of anticoagulants 05/29/2016  . Osteoarthritis of hip    right  . Osteoarthritis of left knee   . Overactive bladder 10/06/2018  . Parkinson disease ([place])   . Paroxysmal A-fib ([place])   . POSTMENOPAUSAL SYNDROME 02/16/2007  . PREMATURE ATRIAL CONTRACTIONS 02/16/2007  . 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 S/P breast  biopsy, left    two o'clock position - benign  . 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 Tremor, unspecified 10/19/2015  . Unstable gait 02/16/2017  . Vaginal atrophy 10/19/2015  . Venous insufficiency   . Weakness 09/12/2018   Past Surgical History:  Procedure Laterality Date  . BREAST BIOPSY Left   . CATARACT EXTRACTION, BILATERAL      Allergies  Allergen Reactions  . Doxycycline Nausea And Vomiting  . Sulfonamide Derivatives     REACTION: HIVES    Allergies as of 03/31/2019      Reactions   Doxycycline Nausea And Vomiting   Sulfonamide Derivatives    REACTION: HIVES      Medication List       Accurate as of March 31, 2019 12:57 PM. If you have any questions, ask your nurse or doctor.        STOP taking these medications   methocarbamol 500 MG tablet Commonly known as: ROBAXIN Stopped by: Man X Mast, NP     TAKE these medications   acetaminophen 500 MG tablet Commonly known as: TYLENOL Take 1,000 mg by mouth 3 (three) times daily. Not to exceed 3000 mg in 24 ( scheduled + Prn doses   acetaminophen 500 MG tablet Commonly known as: TYLENOL Take 500 mg by mouth every 8 (eight) hours as needed. Not to exceed 3 gm in 24 hours between scheduled and prn doses   baclofen 10 MG tablet Commonly known as: LIORESAL Take 10 mg by mouth 2 (two) times daily.   calcium carbonate 750 MG chewable tablet Commonly known as: TUMS EX Chew 1 tablet by mouth daily. Between the hours of 7am-10am.   carbidopa-levodopa 25-100 MG tablet Commonly known as: SINEMET IR Take 2 tablets by mouth in the morning, at noon, in the evening, and at bedtime. Morning: 7am, MidDay 11am, Afternoon 3pm, Evening 7pm.   carbidopa-levodopa 50-200 MG tablet Commonly known as: SINEMET CR Take 1 tablet by mouth at bedtime. 9pm   Clinpro 5000 1.1 % Pste Generic drug: Sodium Fluoride Place onto teeth at bedtime. Between the hours of 7pm-10pm.   diltiazem 120 MG 24 hr capsule Commonly known as:  CARDIZEM CD Take 120 mg by mouth daily. Between the hours of 7am-10am. HOLD IF SBP < 100   ferrous sulfate 325 (65 FE) MG tablet Take 325 mg by mouth. Once a day on Mon, Fri   HYDROcodone-acetaminophen 5-325 MG tablet Commonly known as: NORCO/VICODIN Give 1/2 tablet by mouth every 4 hours prn for right hip pain X 14 days   meloxicam 15 MG tablet Commonly known as: MOBIC Take 15 mg by mouth daily. Between the hours of 7am-10am.   metolazone 2.5 MG tablet Commonly known as: ZAROXOLYN Take 2.5 mg by mouth. Once A Day on Mon, and Thurs at 7:30am. . Hold if systolic blood pressure less than 100   Myrbetriq 50 MG Tb24 tablet Generic  drug: mirabegron ER Take 50 mg by mouth daily. Between the hours of 7am-10am.   polyethylene glycol 17 g packet Commonly known as: MIRALAX / GLYCOLAX Take 17 g by mouth daily.   rivaroxaban 20 MG Tabs tablet Commonly known as: XARELTO Take 20 mg by mouth every evening. Between the hours of 5pm-6pm.   sennosides-docusate sodium 8.6-50 MG tablet Commonly known as: SENOKOT-S Take 1 tablet by mouth at bedtime. Between the hours of 7pm-10pm.   Vitamin D3 10 MCG (400 UNIT) Caps Take 1 capsule by mouth daily. Between the hours of 7am-10am.   zinc oxide 20 % ointment Apply 1 application topically as needed for irritation. To buttocks after every incontinent episode and as needed for redness. May keep at bedside.       Review of Systems  Constitutional: Positive for activity change.  HENT: Negative.   Respiratory: Negative.   Cardiovascular: Positive for leg swelling.  Gastrointestinal: Positive for blood in stool and constipation.  Genitourinary: Positive for urgency.  Musculoskeletal: Positive for arthralgias, gait problem and myalgias.  Neurological: Positive for weakness.  Psychiatric/Behavioral: Negative.     Immunization History  Administered Date(s) Administered  . Influenza Split 10/20/2012  . Influenza Whole 10/21/2006  . Influenza,  High Dose Seasonal PF 10/17/2016  . Influenza-Unspecified 10/20/2013, 10/22/2017  . Moderna SARS-COVID-2 Vaccination 01/22/2019, 02/19/2019  . Pneumococcal Conjugate-13 01/31/2013  . Pneumococcal Polysaccharide-23 01/20/2005, 11/24/2011  . Td 01/21/2004  . Tdap 03/16/2014  . Zoster 04/17/2009   Pertinent  Health Maintenance Due  Topic Date Due  . COLONOSCOPY  06/12/2019  . INFLUENZA VACCINE  Completed  . DEXA SCAN  Completed  . PNA vac Low Risk Adult  Completed   Fall Risk  05/03/2018 11/03/2017 08/04/2017 03/06/2017 11/26/2016  Falls in the past year? 1 No No Yes Yes  Number falls in past yr: 0 - - 2 or more 2 or more  Injury with Fall? 0 - - No No  Risk Factor Category  - - - High Fall Risk High Fall Risk  Risk for fall due to : Other (Comment) - - - -  Follow up Falls evaluation completed;Education provided;Falls prevention discussed - - Falls evaluation completed Falls evaluation completed   Functional Status Survey:    Vitals:   03/31/19 1256  BP: (!) 108/58  Pulse: 72  Resp: 18  Temp: (!) 97.1 F (36.2 C)  Weight: 136 lb (61.7 kg)  Height: '5\' 8"'  (1.727 m)   Body mass index is 20.68 kg/m. Physical Exam Vitals reviewed.  Constitutional:      Appearance: Normal appearance.  HENT:     Head: Normocephalic.     Nose: Nose normal.     Mouth/Throat:     Mouth: Mucous membranes are moist.     Pharynx: Oropharynx is clear.  Eyes:     Pupils: Pupils are equal, round, and reactive to light.  Cardiovascular:     Rate and Rhythm: Normal rate. Rhythm irregular.     Pulses: Normal pulses.  Pulmonary:     Effort: Pulmonary effort is normal.     Breath sounds: Normal breath sounds.  Abdominal:     General: Abdomen is flat. Bowel sounds are normal.     Palpations: Abdomen is soft.  Genitourinary:    Comments: Has External hemorrhoids. One of them was inflamed Musculoskeletal:        General: Swelling present.     Cervical back: Neck supple.  Skin:    General: Skin is  warm.  Neurological:     General: No focal deficit present.     Mental Status: She is alert and oriented to person, place, and time.  Psychiatric:        Mood and Affect: Mood normal.     Labs reviewed: Recent Labs    09/12/18 0304 09/12/18 0304 09/13/18 0201 09/13/18 0201 09/14/18 0043 09/21/18 0000 11/18/18 0000 12/09/18 0000 03/24/19 0000  NA 140   < > 140   < > 140   < > 144 140 141  K 3.6   < > 3.8   < > 3.9   < > 4.1 3.9 4.4  CL 108   < > 112*   < > 111   < > 110* 103 107  CO2 21*   < > 22   < > 23   < > 27* 29* 28*  GLUCOSE 89  --  95  --  109*  --   --   --   --   BUN 27*   < > 24*   < > 15   < > 21 24* 26*  CREATININE 0.88   < > 0.75   < > 0.68   < > 0.7 0.7 0.7  CALCIUM 9.2   < > 8.4*   < > 8.5*   < > 9.3 9.4 9.5  MG  --   --  2.0  --   --   --   --   --   --    < > = values in this interval not displayed.   Recent Labs    09/12/18 0304 09/12/18 0304 09/13/18 0201 09/13/18 0201 09/21/18 0000 12/09/18 0000 03/24/19 0000  AST 60*   < > 50*   < > 20 16 11*  ALT 7   < > 7   < > 20 10 5*  ALKPHOS 45   < > 36*   < > 42 47 50  BILITOT 1.1  --  0.6  --   --   --   --   PROT 6.2*  --  4.9*  --  6.4  --   --   ALBUMIN 3.7   < > 2.8*  --  3.9 3.8 4.0   < > = values in this interval not displayed.   Recent Labs    09/11/18 2105 09/11/18 2105 09/12/18 0304 09/12/18 0304 09/13/18 0201 09/21/18 0000 10/14/18 0000 12/09/18 0000 03/24/19 0000  WBC 10.6*   < > 8.0   < > 8.9   < > 5.4 5.5 8.4  NEUTROABS 9.4*   < >  --   --  7.1  --   --  3,718 6,602  HGB 12.6   < > 11.5*   < > 10.5*   < > 10.1* 11.7* 10.9*  HCT 40.1   < > 35.2*   < > 32.4*   < > 31* 37 34*  MCV 96.4  --  94.4  --  93.9  --   --   --   --   PLT 170   < > 150   < > 125*   < > 184 189 190   < > = values in this interval not displayed.   Lab Results  Component Value Date   TSH 0.408 09/11/2018   No results found for: HGBA1C Lab Results  Component Value Date   CHOL 185 03/24/2019   HDL 79  (A) 03/24/2019   LDLCALC 94  03/24/2019   LDLDIRECT 104.2 01/25/2013   TRIG 41 03/24/2019   CHOLHDL 2 08/29/2015    Significant Diagnostic Results in last 30 days:  MR Hip Right w/o contrast  Result Date: 03/07/2019 CLINICAL DATA:  Chronic right hip pain. EXAM: MR OF THE RIGHT HIP WITHOUT CONTRAST TECHNIQUE: Multiplanar, multisequence MR imaging was performed. No intravenous contrast was administered. COMPARISON:  None. FINDINGS: Both hips are normally located. Moderate degenerative changes bilaterally but no stress fracture or AVN. No hip joint effusion. No periarticular fluid collections to suggest a paralabral cyst. Mild bilateral peritendinosis most notably involving the right gluteus medius tendon. No findings for trochanteric bursitis. The pubic symphysis and SI joints are intact. No pelvic fractures or bone lesions. High-grade partial-thickness tearing of the iliopsoas tendon distally. No complete tear/rupture but possibly at risk for such. Mild edema like signal changes in the gluteus minimus and medius tendons suggesting mild muscle strain or partial tear. The hamstring tendons are intact. No significant intrapelvic abnormalities are identified. Small benign-appearing cystic structure superior to the bladder. IMPRESSION: 1. Moderate bilateral hip joint degenerative changes but no stress fracture or AVN. 2. No significant intrapelvic abnormalities. 3. High-grade partial-thickness tearing of the iliopsoas tendon distally. No complete tear/rupture but possibly at risk for such. 4. Mild bilateral peritendinosis most notably involving the right gluteus medius tendon. Electronically Signed   By: Marijo Sanes M.D.   On: 03/07/2019 15:03   US Guided Needle Placement - No Linked Charges  Result Date: 03/15/2019 Please see Notes tab for imaging impression.   Assessment/Plan  Blood per rectum No Episodes for last few days Will Treat Constipation with Incraseing Senna Also start on Anusol for few  days She is due for Colonoscopy this year  Pain in right hip Seems to be her major problem Has follow up with Dr Nelva Bush today On Tylenol,Baclofen and Meloxicam. Also on Norco PRn  Anemia, unspecified type On Iron Repeat CBC ion 2 weeks  Bilateral lower extremity edema Doing well on Metalazone  Overactive bladder Doing well on Myrbetriq Parkinsonism,  Continue Sinemet  PAF On Cardizem and Xarelto  Family/ staff Communication:   Labs/tests ordered:  CBC in 2 weeks

## 2019-03-31 NOTE — Progress Notes (Signed)
This encounter was created in error - please disregard.

## 2019-04-01 ENCOUNTER — Encounter: Payer: Self-pay | Admitting: Nurse Practitioner

## 2019-04-01 ENCOUNTER — Non-Acute Institutional Stay (SKILLED_NURSING_FACILITY): Payer: Medicare PPO | Admitting: Nurse Practitioner

## 2019-04-01 DIAGNOSIS — M25551 Pain in right hip: Secondary | ICD-10-CM

## 2019-04-01 DIAGNOSIS — I4819 Other persistent atrial fibrillation: Secondary | ICD-10-CM

## 2019-04-01 DIAGNOSIS — R531 Weakness: Secondary | ICD-10-CM | POA: Diagnosis not present

## 2019-04-01 DIAGNOSIS — R1310 Dysphagia, unspecified: Secondary | ICD-10-CM | POA: Insufficient documentation

## 2019-04-01 DIAGNOSIS — G20 Parkinson's disease: Secondary | ICD-10-CM | POA: Diagnosis not present

## 2019-04-01 DIAGNOSIS — I10 Essential (primary) hypertension: Secondary | ICD-10-CM | POA: Diagnosis not present

## 2019-04-01 LAB — CBC AND DIFFERENTIAL
HCT: 28 — AB (ref 36–46)
Hemoglobin: 8.9 — AB (ref 12.0–16.0)
Platelets: 170 (ref 150–399)
WBC: 8.2

## 2019-04-01 LAB — BASIC METABOLIC PANEL
BUN: 33 — AB (ref 4–21)
CO2: 31 — AB (ref 13–22)
Chloride: 104 (ref 99–108)
Creatinine: 1 (ref 0.5–1.1)
Glucose: 120
Potassium: 4.8 (ref 3.4–5.3)
Sodium: 140 (ref 137–147)

## 2019-04-01 LAB — COMPREHENSIVE METABOLIC PANEL
Albumin: 3.8 (ref 3.5–5.0)
Calcium: 9.3 (ref 8.7–10.7)
GFR calc Af Amer: 63
GFR calc non Af Amer: 54
Globulin: 2.1

## 2019-04-01 LAB — CBC: RBC: 2.86 — AB (ref 3.87–5.11)

## 2019-04-01 LAB — HEPATIC FUNCTION PANEL
AST: 12 — AB (ref 13–35)
Alkaline Phosphatase: 48 (ref 25–125)
Bilirubin, Total: 0.6

## 2019-04-01 NOTE — Progress Notes (Signed)
Location:   [facility] [place] Room Number: 13 Place of Service:  SNF (31) Provider: [facility] Sidonie Dexheimer NP  [name], MD  Patient Care Team: [name], MD as PCP - General (Internal Medicine) [name], [name], MD as PCP - Cardiology (Cardiology) [name], [name], DO as Consulting Physician (Neurology) Adrinne Sze X, NP as Nurse Practitioner (Internal Medicine)  Extended Emergency Contact Information Primary Emergency Contact: Becca, Bayne Mobile Phone: [phone] Relation: Daughter Interpreter needed? No Secondary Emergency Contact: [facility] Address: [address]          [place], [place] [address] [name] of [place] Phone: [phone] Mobile Phone: [phone] Relation: Son  Code Status: DNR Goals of care: Advanced Directive information Advanced Directives 04/01/2019  Does Patient Have a Medical Advance Directive? Yes  Type of Advance Directive [place]  Does patient want to make changes to medical advance directive? No - Patient declined  Copy of [place] in Chart? Yes - validated most recent copy scanned in chart (See row information)  Would patient like information on creating a medical advance directive? -  Pre-existing out of facility DNR order (yellow form or pink MOST form) -     Chief Complaint  Patient presents with  . Acute Visit    Trouble swallowing     HPI:  Pt is a 79 y.o. female seen today for an acute visit for  reported the patient stated she could not move, felt weak, difficulty swallowing, daughter and the patient think Baclofen is contributory, no noted focal weakness, she denied headache, change of vision, chest pain/pressure, palpitation, or SOB. Hx of right hip pain, on Tylenol 1044m tid, Baclofen 126mbid, Meloxicam 1536md. Parkinson's disease, tremors in fingers are more pronounce today, on Sinemet. AFib, heart rate is in control, on Diltiazem 120m44m, Xarelto 20mg60m      Past Medical History:  Diagnosis Date  . Acute lower UTI 09/14/2018  . Anemia 10/15/2018   2016 colonoscopy 10/14/18 wbc 5.4, Hgb 10.1, plt 184 12/01/18 wbc 4.5, Hgb 11.1, plt 188, neutrophils 63,  Na 139, K 3.7, Bun 23, creat 0.69, eGFR 83 on Fe, Hgb 11.7 12/09/18   . Atrial fibrillation (HCC) [place]/2012   10/07/18 Na 143, K 4.0, Bun 20, creat 0.79, eGFR 72, wbc 7.1, Hgb 10.9, plt 172 10/28/18 Na 142, K 4.0, Bun 19, creat 0.77, eGFR 74 11/18/18 Na 144, K 4.1, Bun 21, creat 0.69, eGFR 83  . Bilateral lower extremity edema 07/19/2018   11/02/18 BMP 2 weeks.   . Cervical pain (neck) 06/20/2010  . Closed fracture of part of upper end of humerus 05/01/2015  . Colles' fracture of right radius 03/05/2015  . Constipation 07/19/2018  . DEGENERATIVE JOINT DISEASE, RIGHT HIP 02/16/2007  . Dupuytren's contracture   . Dysuria 09/20/2018   09/20/18 c/o got up several times last night to go urinate, burning on urination, lower abd /back discomfort, but urinary frequency, leakage are not new. UA C/S, Pyridium 100mg 48mx 2 days.  09/21/18 wbc 6.1, Hgb 11.8, plt 210, neutrophil 69.4, Na 142, K 4.1, Bun 19, creat 0.78, TP 6.4, albumin 3.9  . Hematuria 02/04/2014  . Hypotension 09/18/2018  . Long term (current) use of anticoagulants 05/29/2016  . Osteoarthritis of hip    right  . Osteoarthritis of left knee   . Overactive bladder 10/06/2018  . Parkinson disease (HCC)  [place]  [place] SY[place] 02/16/2007  .  PREMATURE ATRIAL CONTRACTIONS 02/16/2007  . 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 S/P breast biopsy, left    two o'clock position - benign  . 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 Tremor, unspecified 10/19/2015  . Unstable gait 02/16/2017  . Vaginal atrophy 10/19/2015  . Venous insufficiency   . Weakness 09/12/2018   Past Surgical History:  Procedure Laterality Date  . BREAST BIOPSY Left   . CATARACT EXTRACTION, BILATERAL      Allergies  Allergen Reactions  .  Doxycycline Nausea And Vomiting  . Sulfonamide Derivatives     REACTION: HIVES    Allergies as of 04/01/2019      Reactions   Doxycycline Nausea And Vomiting   Sulfonamide Derivatives    REACTION: HIVES      Medication List       Accurate as of April 01, 2019  2:34 PM. If you have any questions, ask your nurse or doctor.        acetaminophen 500 MG tablet Commonly known as: TYLENOL Take 1,000 mg by mouth 3 (three) times daily. Not to exceed 3000 mg in 24 ( scheduled + Prn doses   acetaminophen 500 MG tablet Commonly known as: TYLENOL Take 500 mg by mouth every 8 (eight) hours as needed. Not to exceed 3 gm in 24 hours between scheduled and prn doses   baclofen 10 MG tablet Commonly known as: LIORESAL Take 10 mg by mouth 2 (two) times daily.   calcium carbonate 750 MG chewable tablet Commonly known as: TUMS EX Chew 1 tablet by mouth daily. Between the hours of 7am-10am.   carbidopa-levodopa 25-100 MG tablet Commonly known as: SINEMET IR Take 2 tablets by mouth in the morning, at noon, in the evening, and at bedtime. Morning: 7am, MidDay 11am, Afternoon 3pm, Evening 7pm.   carbidopa-levodopa 50-200 MG tablet Commonly known as: SINEMET CR Take 1 tablet by mouth at bedtime. 9pm   Clinpro 5000 1.1 % Pste Generic drug: Sodium Fluoride Place onto teeth at bedtime. Between the hours of 7pm-10pm.   diltiazem 120 MG 24 hr capsule Commonly known as: CARDIZEM CD Take 120 mg by mouth daily. Between the hours of 7am-10am. HOLD IF SBP < 100   ferrous sulfate 325 (65 FE) MG tablet Take 325 mg by mouth. Once a day on Mon, Fri   HYDROcodone-acetaminophen 5-325 MG tablet Commonly known as: NORCO/VICODIN Give 1/2 tablet by mouth every 4 hours prn for right hip pain X 14 days   hydrocortisone 25 MG suppository Commonly known as: ANUSOL-HC Place 25 mg rectally at bedtime as needed for hemorrhoids or anal itching.   meloxicam 15 MG tablet Commonly known as: MOBIC Take 15 mg by  mouth daily. Between the hours of 7am-10am.   metolazone 2.5 MG tablet Commonly known as: ZAROXOLYN Take 2.5 mg by mouth. Once A Day on Mon, and Thurs at 7:30am. . Hold if systolic blood pressure less than 100   Myrbetriq 50 MG Tb24 tablet Generic drug: mirabegron ER Take 50 mg by mouth daily. Between the hours of 7am-10am.   polyethylene glycol 17 g packet Commonly known as: MIRALAX / GLYCOLAX Take 17 g by mouth daily.   rivaroxaban 20 MG Tabs tablet Commonly known as: XARELTO Take 20 mg by mouth every evening. Between the hours of 5pm-6pm.   sennosides-docusate sodium 8.6-50 MG tablet Commonly known as: SENOKOT-S Take 1 tablet by mouth at bedtime. Between the hours of 7pm-10pm.   Vitamin D3 10 MCG (400 UNIT) Caps  Take 1 capsule by mouth daily. Between the hours of 7am-10am.   zinc oxide 20 % ointment Apply 1 application topically as needed for irritation. To buttocks after every incontinent episode and as needed for redness. May keep at bedside.       Review of Systems  Constitutional: Positive for activity change and fatigue. Negative for appetite change, chills, diaphoresis and fever.  HENT: Positive for hearing loss and trouble swallowing. Negative for congestion and voice change.   Eyes: Negative for visual disturbance.  Respiratory: Negative for cough, shortness of breath and wheezing.   Cardiovascular: Positive for leg swelling. Negative for chest pain and palpitations.  Gastrointestinal: Negative for abdominal distention, abdominal pain, anal bleeding, blood in stool, constipation, nausea and vomiting.  Genitourinary: Negative for difficulty urinating, dysuria and urgency.  Musculoskeletal: Positive for arthralgias, gait problem and myalgias.       Right hip pain  Skin: Negative for color change.  Neurological: Negative for dizziness, speech difficulty, numbness and headaches.       Moves slow.   Psychiatric/Behavioral: Negative for agitation, behavioral problems  and sleep disturbance. The patient is not nervous/anxious.     Immunization History  Administered Date(s) Administered  . Influenza Split 10/20/2012  . Influenza Whole 10/21/2006  . Influenza, High Dose Seasonal PF 10/17/2016  . Influenza-Unspecified 10/20/2013, 10/22/2017  . Moderna SARS-COVID-2 Vaccination 01/22/2019, 02/19/2019  . Pneumococcal Conjugate-13 01/31/2013  . Pneumococcal Polysaccharide-23 01/20/2005, 11/24/2011  . Td 01/21/2004  . Tdap 03/16/2014  . Zoster 04/17/2009   Pertinent  Health Maintenance Due  Topic Date Due  . COLONOSCOPY  06/12/2019  . INFLUENZA VACCINE  Completed  . DEXA SCAN  Completed  . PNA vac Low Risk Adult  Completed   Fall Risk  05/03/2018 11/03/2017 08/04/2017 03/06/2017 11/26/2016  Falls in the past year? 1 No No Yes Yes  Number falls in past yr: 0 - - 2 or more 2 or more  Injury with Fall? 0 - - No No  Risk Factor Category  - - - High Fall Risk High Fall Risk  Risk for fall due to : Other (Comment) - - - -  Follow up Falls evaluation completed;Education provided;Falls prevention discussed - - Falls evaluation completed Falls evaluation completed   Functional Status Survey:    Vitals:   04/01/19 1151  BP: 112/60  Pulse: 85  Resp: 18  Temp: 98 F (36.7 C)  SpO2: 97%  Weight: 136 lb 11.2 oz (62 kg)  Height: '5\' 8"'  (1.727 m)   Body mass index is 20.79 kg/m. Physical Exam Vitals and nursing note reviewed.  Constitutional:      General: She is not in acute distress.    Appearance: Normal appearance. She is not ill-appearing, toxic-appearing or diaphoretic.  HENT:     Head: Normocephalic and atraumatic.     Nose: Nose normal.     Mouth/Throat:     Mouth: Mucous membranes are moist.  Eyes:     Extraocular Movements: Extraocular movements intact.     Conjunctiva/sclera: Conjunctivae normal.     Pupils: Pupils are equal, round, and reactive to light.  Cardiovascular:     Rate and Rhythm: Normal rate. Rhythm irregular.     Heart  sounds: No murmur.  Pulmonary:     Breath sounds: No wheezing, rhonchi or rales.  Abdominal:     General: Bowel sounds are normal. There is no distension.     Palpations: Abdomen is soft.     Tenderness: There is no  abdominal tenderness. There is no right CVA tenderness, left CVA tenderness, guarding or rebound.  Musculoskeletal:     Cervical back: Normal range of motion and neck supple.     Right lower leg: Edema present.     Left lower leg: Edema present.     Comments: 1+ edema BLE.   Skin:    General: Skin is warm and dry.  Neurological:     General: No focal deficit present.     Mental Status: She is alert and oriented to person, place, and time. Mental status is at baseline.     Motor: No weakness.     Coordination: Coordination abnormal.     Gait: Gait abnormal.     Comments: Moves slow, more tremor in hands  Psychiatric:        Mood and Affect: Mood normal.        Behavior: Behavior normal.        Thought Content: Thought content normal.     Labs reviewed: Recent Labs    09/12/18 0304 09/12/18 0304 09/13/18 0201 09/13/18 0201 09/14/18 0043 09/21/18 0000 11/18/18 0000 12/09/18 0000 03/24/19 0000  NA 140   < > 140   < > 140   < > 144 140 141  K 3.6   < > 3.8   < > 3.9   < > 4.1 3.9 4.4  CL 108   < > 112*   < > 111   < > 110* 103 107  CO2 21*   < > 22   < > 23   < > 27* 29* 28*  GLUCOSE 89  --  95  --  109*  --   --   --   --   BUN 27*   < > 24*   < > 15   < > 21 24* 26*  CREATININE 0.88   < > 0.75   < > 0.68   < > 0.7 0.7 0.7  CALCIUM 9.2   < > 8.4*   < > 8.5*   < > 9.3 9.4 9.5  MG  --   --  2.0  --   --   --   --   --   --    < > = values in this interval not displayed.   Recent Labs    09/12/18 0304 09/12/18 0304 09/13/18 0201 09/13/18 0201 09/21/18 0000 12/09/18 0000 03/24/19 0000  AST 60*   < > 50*   < > 20 16 11*  ALT 7   < > 7   < > 20 10 5*  ALKPHOS 45   < > 36*   < > 42 47 50  BILITOT 1.1  --  0.6  --   --   --   --   PROT 6.2*  --  4.9*   --  6.4  --   --   ALBUMIN 3.7   < > 2.8*  --  3.9 3.8 4.0   < > = values in this interval not displayed.   Recent Labs    09/11/18 2105 09/11/18 2105 09/12/18 0304 09/12/18 0304 09/13/18 0201 09/21/18 0000 10/14/18 0000 12/09/18 0000 03/24/19 0000  WBC 10.6*   < > 8.0   < > 8.9   < > 5.4 5.5 8.4  NEUTROABS 9.4*   < >  --   --  7.1  --   --  3,718 6,602  HGB 12.6   < > 11.5*   < >  10.5*   < > 10.1* 11.7* 10.9*  HCT 40.1   < > 35.2*   < > 32.4*   < > 31* 37 34*  MCV 96.4  --  94.4  --  93.9  --   --   --   --   PLT 170   < > 150   < > 125*   < > 184 189 190   < > = values in this interval not displayed.   Lab Results  Component Value Date   TSH 0.408 09/11/2018   No results found for: HGBA1C Lab Results  Component Value Date   CHOL 185 03/24/2019   HDL 79 (A) 03/24/2019   LDLCALC 94 03/24/2019   LDLDIRECT 104.2 01/25/2013   TRIG 41 03/24/2019   CHOLHDL 2 08/29/2015    Significant Diagnostic Results in last 30 days:  MR Hip Right w/o contrast  Result Date: 03/07/2019 CLINICAL DATA:  Chronic right hip pain. EXAM: MR OF THE RIGHT HIP WITHOUT CONTRAST TECHNIQUE: Multiplanar, multisequence MR imaging was performed. No intravenous contrast was administered. COMPARISON:  None. FINDINGS: Both hips are normally located. Moderate degenerative changes bilaterally but no stress fracture or AVN. No hip joint effusion. No periarticular fluid collections to suggest a paralabral cyst. Mild bilateral peritendinosis most notably involving the right gluteus medius tendon. No findings for trochanteric bursitis. The pubic symphysis and SI joints are intact. No pelvic fractures or bone lesions. High-grade partial-thickness tearing of the iliopsoas tendon distally. No complete tear/rupture but possibly at risk for such. Mild edema like signal changes in the gluteus minimus and medius tendons suggesting mild muscle strain or partial tear. The hamstring tendons are intact. No significant intrapelvic  abnormalities are identified. Small benign-appearing cystic structure superior to the bladder. IMPRESSION: 1. Moderate bilateral hip joint degenerative changes but no stress fracture or AVN. 2. No significant intrapelvic abnormalities. 3. High-grade partial-thickness tearing of the iliopsoas tendon distally. No complete tear/rupture but possibly at risk for such. 4. Mild bilateral peritendinosis most notably involving the right gluteus medius tendon. Electronically Signed   By: Marijo Sanes M.D.   On: 03/07/2019 15:03   US Guided Needle Placement - No Linked Charges  Result Date: 03/15/2019 Please see Notes tab for imaging impression.   Assessment/Plan: Dysphagia 04/01/19 reported the patient stated she could not move, felt weak, difficulty swallowing, daughter and the patient think Baclofen is contributory, will down grade diet to mechanical soft, ST to eval/treat/recommendation. Hold Baclofen until generalized weakness improves then prn.   Atrial fibrillation (HCC) Heart rate is in control, continue Diltiazem, Xarelto.   Parkinsonism ([place]) More tremor in hands noted, continue Sinemet.   Generalized weakness  reported the patient stated she could not move, felt weak, difficulty swallowing, daughter and the patient think Baclofen is contributory, will down grade diet to mechanical soft, ST to eval/treat/recommendation. Hold Baclofen until generalized weakness improves then prn. Update CBC, BMP   Pain in right hip Under went Ortho eval, better controlled, continue Tylenol, Meloxicam, may consider change Baclofen to prn due to generalized weakness/trouble swallowing.     Family/ staff Communication: plan of care reviewed with the patient and charge nurse.   Labs/tests ordered: CBC, BMP  Time spend 25 minutes.

## 2019-04-01 NOTE — Assessment & Plan Note (Addendum)
Under went Ortho eval, better controlled, continue Tylenol, Meloxicam, may consider change Baclofen to prn due to generalized weakness/trouble swallowing.

## 2019-04-01 NOTE — Assessment & Plan Note (Addendum)
reported the patient stated she could not move, felt weak, difficulty swallowing, daughter and the patient think Baclofen is contributory, will down grade diet to mechanical soft, ST to eval/treat/recommendation. Hold Baclofen until generalized weakness improves then prn. Update CBC, BMP

## 2019-04-01 NOTE — Telephone Encounter (Signed)
Patient message routed to provider.

## 2019-04-01 NOTE — Assessment & Plan Note (Addendum)
04/01/19 reported the patient stated she could not move, felt weak, difficulty swallowing, daughter and the patient think Baclofen is contributory, will down grade diet to mechanical soft, ST to eval/treat/recommendation. Hold Baclofen until generalized weakness improves then prn.

## 2019-04-01 NOTE — Assessment & Plan Note (Signed)
Heart rate is in control, continue Diltiazem, Xarelto.

## 2019-04-01 NOTE — Assessment & Plan Note (Signed)
More tremor in hands noted, continue Sinemet.

## 2019-04-04 ENCOUNTER — Encounter: Payer: Self-pay | Admitting: Nurse Practitioner

## 2019-04-04 DIAGNOSIS — I4819 Other persistent atrial fibrillation: Secondary | ICD-10-CM

## 2019-04-04 DIAGNOSIS — G20 Parkinson's disease: Secondary | ICD-10-CM

## 2019-04-04 DIAGNOSIS — K219 Gastro-esophageal reflux disease without esophagitis: Secondary | ICD-10-CM | POA: Insufficient documentation

## 2019-04-04 DIAGNOSIS — M25551 Pain in right hip: Secondary | ICD-10-CM

## 2019-04-04 DIAGNOSIS — D649 Anemia, unspecified: Secondary | ICD-10-CM

## 2019-04-04 NOTE — Progress Notes (Signed)
Location:   SNF [place] Room Number: 13 Place of Service:  SNF (31) Provider: [facility] Mast NP  [name], MD  Patient Care Team: [name], MD as PCP - General (Internal Medicine) [name], [name], MD as PCP - Cardiology (Cardiology) [name], [name], DO as Consulting Physician (Neurology) Mast, Man X, NP as Nurse Practitioner (Internal Medicine)  Extended Emergency Contact Information Primary Emergency Contact: Bryar, Rennie Mobile Phone: [phone] Relation: Daughter Interpreter needed? No Secondary Emergency Contact: [facility] Address: [address]          [place], [place] [address] [name] of [place] Phone: [phone] Mobile Phone: [phone] Relation: Son  Code Status: DNR Goals of care: Advanced Directive information Advanced Directives 04/05/2019  Does Patient Have a Medical Advance Directive? Yes  Type of Paramedic of [place];Out of facility DNR (pink MOST or yellow form)  Does patient want to make changes to medical advance directive? No - Patient declined  Copy of [place] in Chart? Yes - validated most recent copy scanned in chart (See row information)  Would patient like information on creating a medical advance directive? -  Pre-existing out of facility DNR order (yellow form or pink MOST form) -     Chief Complaint  Patient presents with   Acute Visit    Decreased Hemoglobin    HPI:  Pt is a 79 y.o. female seen today for an acute visit for Hgb dropped from 10 to 8, upset stomach x 1 week, denied nausea, vomiting, blood in stool. Hx of Hgb, last Hgb 10s, on Fe. Hx of right hip pain, on Mobic-the patient refused, Baclofen on hold since 04/01/19 due to generalized weakness/difficulty swallowing-resolved to almost her baseline today. Afib, heart rate is in control, on Diltiazem, Xarelto. Parkinson's, on Sinemet, early am muscle spasm in legs early am.    Past Medical History:   Diagnosis Date   Acute lower UTI 09/14/2018   Anemia 10/15/2018   2016 colonoscopy 10/14/18 wbc 5.4, Hgb 10.1, plt 184 12/01/18 wbc 4.5, Hgb 11.1, plt 188, neutrophils 63,  Na 139, K 3.7, Bun 23, creat 0.69, eGFR 83 on Fe, Hgb 11.7 12/09/18    Atrial fibrillation (HCC) 05/21/2010   10/07/18 Na 143, K 4.0, Bun 20, creat 0.79, eGFR 72, wbc 7.1, Hgb 10.9, plt 172 10/28/18 Na 142, K 4.0, Bun 19, creat 0.77, eGFR 74 11/18/18 Na 144, K 4.1, Bun 21, creat 0.69, eGFR 83   Bilateral lower extremity edema 07/19/2018   11/02/18 BMP 2 weeks.    Cervical pain (neck) 06/20/2010   Closed fracture of part of upper end of humerus 05/01/2015   Colles' fracture of right radius 03/05/2015   Constipation 07/19/2018   DEGENERATIVE JOINT DISEASE, RIGHT HIP 02/16/2007   Dupuytren's contracture    Dysuria 09/20/2018   09/20/18 c/o got up several times last night to go urinate, burning on urination, lower abd /back discomfort, but urinary frequency, leakage are not new. UA C/S, Pyridium 153m tid x 2 days.  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   S/P breast biopsy, left    two o'clock position - benign   Toxic effect of venom(989.5) 07/27/2007   Tremor, unspecified 10/19/2015   Unstable gait 02/16/2017   Vaginal atrophy 10/19/2015   Venous insufficiency    Weakness 09/12/2018   Past Surgical History:  Procedure Laterality Date   BREAST BIOPSY Left    CATARACT EXTRACTION, BILATERAL      Allergies  Allergen Reactions   Doxycycline Nausea And  Vomiting   Sulfonamide Derivatives     REACTION: HIVES    Allergies as of 04/04/2019      Reactions   Doxycycline Nausea And Vomiting   Sulfonamide Derivatives    REACTION: HIVES      Medication List       Accurate as of April 04, 2019 11:59 PM. If you have any questions, ask your nurse or doctor.        STOP taking these medications   hydrocortisone 25 MG suppository Commonly known as: ANUSOL-HC Stopped by: Alica Shellhammer X Sedalia Greeson, NP   meloxicam 15 MG tablet Commonly known as: MOBIC Stopped by: Lakisa Lotz X Mayreli Alden, NP     TAKE these medications   acetaminophen 500 MG tablet Commonly known as: TYLENOL Take 1,000 mg by mouth 3 (three) times daily. Not to exceed 3000 mg in 24 ( scheduled + Prn doses   acetaminophen 500 MG tablet Commonly known as: TYLENOL Take 500 mg by mouth every 8 (eight) hours as needed. Not to exceed 3 gm in 24 hours between scheduled and prn doses   baclofen 10 MG tablet Commonly known as: LIORESAL Take 10 mg by mouth as needed.   calcium carbonate 750 MG chewable tablet Commonly known as: TUMS EX Chew 1 tablet by mouth daily. Between the hours of 7am-10am.   carbidopa-levodopa 25-100 MG tablet Commonly known as: SINEMET IR Take 2 tablets by mouth in the morning, at noon, in the evening, and at bedtime. Morning: 7am, MidDay 11am, Afternoon 3pm, Evening 7pm.   carbidopa-levodopa 50-200 MG tablet Commonly known as: SINEMET CR Take 1 tablet by mouth at bedtime. 9pm   Clinpro 5000 1.1 % Pste Generic drug: Sodium Fluoride Place onto teeth at bedtime. Between the hours of 7pm-10pm.   diltiazem 120 MG 24 hr capsule Commonly known as: CARDIZEM CD Take 120 mg by mouth daily. Between the hours of 7am-10am. HOLD IF SBP < 100   ferrous sulfate 325 (65 FE) MG tablet Take 325 mg by mouth. Once a day on Mon, Fri   HYDROcodone-acetaminophen 5-325 MG tablet Commonly known as: NORCO/VICODIN Give 1/2 tablet by mouth every 4 hours prn for right hip pain X 14 days     metolazone 2.5 MG tablet Commonly known as: ZAROXOLYN Take 2.5 mg by mouth. Once A Day on Mon, and Thurs at 7:30am. . Hold if systolic blood pressure less than 100   Myrbetriq 50 MG Tb24 tablet Generic drug: mirabegron ER Take 50 mg by mouth daily. Between the hours of 7am-10am.   polyethylene glycol 17 g packet Commonly known as: MIRALAX / GLYCOLAX Take 17 g by mouth daily.   rivaroxaban 20 MG Tabs tablet Commonly known as: XARELTO Take 20 mg by mouth every evening. Between the hours of 5pm-6pm.   sennosides-docusate sodium 8.6-50 MG tablet Commonly known as: SENOKOT-S Take 1 tablet by mouth at bedtime. Between the hours of 7pm-10pm.   Vitamin D3 10 MCG (400  UNIT) Caps Take 1 capsule by mouth daily. Between the hours of 7am-10am.   zinc oxide 20 % ointment Apply 1 application topically as needed for irritation. To buttocks after every incontinent episode and as needed for redness. May keep at bedside.       Review of Systems  Constitutional: Positive for fatigue. Negative for activity change, appetite change and fever.  HENT: Positive for hearing loss and trouble swallowing. Negative for congestion and voice change.   Eyes: Negative for visual disturbance.  Respiratory: Negative for cough, shortness of breath and wheezing.   Cardiovascular: Positive for leg swelling. Negative for chest pain and palpitations.  Gastrointestinal: Negative for abdominal distention, abdominal pain, anal bleeding, blood in stool, constipation, nausea and vomiting.       Upset stomach about a week.   Genitourinary: Negative for difficulty urinating, dysuria and urgency.  Musculoskeletal: Positive for arthralgias, gait problem and myalgias.       Right hip pain  Neurological: Negative for dizziness, speech difficulty, numbness and headaches.       Moves slow. Early am leg muscle spasm.   Psychiatric/Behavioral: Negative for agitation, behavioral problems and sleep disturbance. The patient is not  nervous/anxious.     Immunization History  Administered Date(s) Administered   Influenza Split 10/20/2012   Influenza Whole 10/21/2006   Influenza, High Dose Seasonal PF 10/17/2016   Influenza-Unspecified 10/20/2013, 10/22/2017   Moderna SARS-COVID-2 Vaccination 01/22/2019, 02/19/2019   Pneumococcal Conjugate-13 01/31/2013   Pneumococcal Polysaccharide-23 01/20/2005, 11/24/2011   Td 01/21/2004   Tdap 03/16/2014   Zoster 04/17/2009   Pertinent  Health Maintenance Due  Topic Date Due   COLONOSCOPY  06/12/2019   INFLUENZA VACCINE  Completed   DEXA SCAN  Completed   PNA vac Low Risk Adult  Completed   Fall Risk  05/03/2018 11/03/2017 08/04/2017 03/06/2017 11/26/2016  Falls in the past year? 1 No No Yes Yes  Number falls in past yr: 0 - - 2 or more 2 or more  Injury with Fall? 0 - - No No  Risk Factor Category  - - - High Fall Risk High Fall Risk  Risk for fall due to : Other (Comment) - - - -  Follow up Falls evaluation completed;Education provided;Falls prevention discussed - - Falls evaluation completed Falls evaluation completed   Functional Status Survey:    Vitals:   04/04/19 1121  BP: (!) 94/54  Pulse: 85  Resp: 18  Temp: 97.6 F (36.4 C)  SpO2: 96%  Weight: 134 lb 1.6 oz (60.8 kg)  Height: 5' 8" (1.727 m)   Body mass index is 20.39 kg/m. Physical Exam Vitals and nursing note reviewed.  Constitutional:      General: She is not in acute distress.    Appearance: Normal appearance. She is not ill-appearing.  HENT:     Head: Normocephalic and atraumatic.     Nose: Nose normal.     Mouth/Throat:     Mouth: Mucous membranes are moist.  Eyes:     Extraocular Movements: Extraocular movements intact.     Conjunctiva/sclera: Conjunctivae normal.     Pupils: Pupils are equal, round, and reactive to light.  Cardiovascular:     Rate and Rhythm: Normal rate. Rhythm irregular.     Heart sounds: No murmur.  Pulmonary:     Breath sounds: No wheezing,  rhonchi or rales.  Abdominal:     General: Bowel sounds are normal. There is no distension.     Palpations: Abdomen is soft.  Tenderness: There is no abdominal tenderness. There is right CVA tenderness. There is no guarding or rebound.  Musculoskeletal:     Cervical back: Normal range of motion and neck supple.     Right lower leg: Edema present.     Left lower leg: Edema present.     Comments: 1+ edema BLE.   Skin:    General: Skin is warm and dry.  Neurological:     General: No focal deficit present.     Mental Status: She is alert and oriented to person, place, and time. Mental status is at baseline.     Motor: No weakness.     Coordination: Coordination abnormal.     Gait: Gait abnormal.     Comments: Moves slow, fine tremors in fingers.   Psychiatric:        Mood and Affect: Mood normal.        Behavior: Behavior normal.        Thought Content: Thought content normal.     Labs reviewed: Recent Labs    09/12/18 0304 09/12/18 0304 09/13/18 0201 09/13/18 0201 09/14/18 0043 09/21/18 0000 12/09/18 0000 03/24/19 0000 04/01/19 0000  NA 140   < > 140   < > 140   < > 140 141 140  K 3.6   < > 3.8   < > 3.9   < > 3.9 4.4 4.8  CL 108   < > 112*   < > 111   < > 103 107 104  CO2 21*   < > 22   < > 23   < > 29* 28* 31*  GLUCOSE 89  --  95  --  109*  --   --   --   --   BUN 27*   < > 24*   < > 15   < > 24* 26* 33*  CREATININE 0.88   < > 0.75   < > 0.68   < > 0.7 0.7 1.0  CALCIUM 9.2   < > 8.4*   < > 8.5*   < > 9.4 9.5 9.3  MG  --   --  2.0  --   --   --   --   --   --    < > = values in this interval not displayed.   Recent Labs    09/12/18 0304 09/12/18 0304 09/13/18 0201 09/13/18 0201 09/21/18 0000 12/09/18 0000 03/24/19 0000 04/01/19 0000  AST 60*   < > 50*   < > 20 16 11* 12*  ALT 7   < > 7   < > 20 10 5*  --   ALKPHOS 45   < > 36*   < > 42 47 50 48  BILITOT 1.1  --  0.6  --   --   --   --   --   PROT 6.2*  --  4.9*  --  6.4  --   --   --   ALBUMIN 3.7   <  > 2.8*   < > 3.9 3.8 4.0 3.8   < > = values in this interval not displayed.   Recent Labs    09/11/18 2105 09/11/18 2105 09/12/18 0304 09/12/18 0304 09/13/18 0201 09/21/18 0000 12/09/18 0000 03/24/19 0000 04/01/19 0000  WBC 10.6*   < > 8.0   < > 8.9   < > 5.5 8.4 8.2  NEUTROABS 9.4*   < >  --   --  7.1  --  3,718 6,602  --   HGB 12.6   < > 11.5*   < > 10.5*   < > 11.7* 10.9* 8.9*  HCT 40.1   < > 35.2*   < > 32.4*   < > 37 34* 28*  MCV 96.4  --  94.4  --  93.9  --   --   --   --   PLT 170   < > 150   < > 125*   < > 189 190 170   < > = values in this interval not displayed.   Lab Results  Component Value Date   TSH 0.408 09/11/2018   No results found for: HGBA1C Lab Results  Component Value Date   CHOL 185 03/24/2019   HDL 79 (A) 03/24/2019   LDLCALC 94 03/24/2019   LDLDIRECT 104.2 01/25/2013   TRIG 41 03/24/2019   CHOLHDL 2 08/29/2015    Significant Diagnostic Results in last 30 days:  MR Hip Right w/o contrast  Result Date: 03/07/2019 CLINICAL DATA:  Chronic right hip pain. EXAM: MR OF THE RIGHT HIP WITHOUT CONTRAST TECHNIQUE: Multiplanar, multisequence MR imaging was performed. No intravenous contrast was administered. COMPARISON:  None. FINDINGS: Both hips are normally located. Moderate degenerative changes bilaterally but no stress fracture or AVN. No hip joint effusion. No periarticular fluid collections to suggest a paralabral cyst. Mild bilateral peritendinosis most notably involving the right gluteus medius tendon. No findings for trochanteric bursitis. The pubic symphysis and SI joints are intact. No pelvic fractures or bone lesions. High-grade partial-thickness tearing of the iliopsoas tendon distally. No complete tear/rupture but possibly at risk for such. Mild edema like signal changes in the gluteus minimus and medius tendons suggesting mild muscle strain or partial tear. The hamstring tendons are intact. No significant intrapelvic abnormalities are identified.  Small benign-appearing cystic structure superior to the bladder. IMPRESSION: 1. Moderate bilateral hip joint degenerative changes but no stress fracture or AVN. 2. No significant intrapelvic abnormalities. 3. High-grade partial-thickness tearing of the iliopsoas tendon distally. No complete tear/rupture but possibly at risk for such. 4. Mild bilateral peritendinosis most notably involving the right gluteus medius tendon. Electronically Signed   By: Marijo Sanes M.D.   On: 03/07/2019 15:03   US Guided Needle Placement - No Linked Charges  Result Date: 03/15/2019 Please see Notes tab for imaging impression.   Assessment/Plan: Anemia 04/01/19 wbc 8.2, Hgb 8.9, plt 170, Na 140, K 4.8, Bun 33, creat 0.99, eGFR 54 04/04/19 dc Mobic due to upset stomach and the patient's request, pending FBOT x2, CBC one week. Adding Omeprazole 16m qd for GI protection. Continue Fe supplement.   GERD (gastroesophageal reflux disease) Upset stomach x1 week, Mobic may be the culprit, dc Mobic, adding Omeprazole 243mqd.   Parkinsonism ([facility]Experienced muscle spasm around 4 am, L>R, declined Baclofen or any muscle relaxants, declined Requip, desires to change hs Sinemet to 10 pm. Observe.   Pain in right hip Positional, continue Tylenol, Norco, dc Mobic and Baclofen.   Atrial fibrillation ([place]umaHeart rate is in control, continue Diltiazem, Xarelto    Family/ staff Communication: plan of care reviewed with the patient and charge nurse.   Labs/tests ordered:  CBC one week  Time spend 25 minutes.

## 2019-04-04 NOTE — Assessment & Plan Note (Signed)
Positional, continue Tylenol, Norco, dc Mobic and Baclofen.

## 2019-04-04 NOTE — Assessment & Plan Note (Addendum)
04/01/19 wbc 8.2, Hgb 8.9, plt 170, Na 140, K 4.8, Bun 33, creat 0.99, eGFR 54 04/04/19 dc Mobic due to upset stomach and the patient's request, pending FBOT x2, CBC one week. Adding Omeprazole 56m qd for GI protection. Continue Fe supplement.

## 2019-04-04 NOTE — Assessment & Plan Note (Signed)
Upset stomach x1 week, Mobic may be the culprit, dc Mobic, adding Omeprazole 20mg  qd.

## 2019-04-04 NOTE — Assessment & Plan Note (Signed)
Heart rate is in control, continue Diltiazem, Xarelto

## 2019-04-04 NOTE — Assessment & Plan Note (Signed)
Experienced muscle spasm around 4 am, L>R, declined Baclofen or any muscle relaxants, declined Requip, desires to change hs Sinemet to 10 pm. Observe.

## 2019-04-04 NOTE — Progress Notes (Signed)
Location:   [place] Room Number: 13 Place of Service:  SNF (31) Provider:  Joretta Eads, NP  [name], MD  Patient Care Team: [name], MD as PCP - General (Internal Medicine) [name], [name], MD as PCP - Cardiology (Cardiology) [name], [name], DO as Consulting Physician (Neurology) Hykeem Ojeda X, NP as Nurse Practitioner (Internal Medicine)  Extended Emergency Contact Information Primary Emergency Contact: Fariha, Goto Mobile Phone: [phone] Relation: Daughter Interpreter needed? No Secondary Emergency Contact: [facility] Address: [address]          [place], [place] [address] [name] of [place] Phone: [phone] Mobile Phone: [phone] Relation: Son  Code Status:  DNR Goals of care: Advanced Directive information Advanced Directives 04/04/2019  Does Patient Have a Medical Advance Directive? Yes  Type of Paramedic of [place];Out of facility DNR (pink MOST or yellow form)  Does patient want to make changes to medical advance directive? No - Patient declined  Copy of [place] in Chart? Yes - validated most recent copy scanned in chart (See row information)  Would patient like information on creating a medical advance directive? -  Pre-existing out of facility DNR order (yellow form or pink MOST form) -     Chief Complaint  Patient presents with  . Acute Visit    Decreased Hemoglobin    HPI:  Pt is a 79 y.o. female seen today for an acute visit for    Past Medical History:  Diagnosis Date  . Acute lower UTI 09/14/2018  . Anemia 10/15/2018   2016 colonoscopy 10/14/18 wbc 5.4, Hgb 10.1, plt 184 12/01/18 wbc 4.5, Hgb 11.1, plt 188, neutrophils 63,  Na 139, K 3.7, Bun 23, creat 0.69, eGFR 83 on Fe, Hgb 11.7 12/09/18   . Atrial fibrillation ([place]) 05/21/2010   10/07/18 Na 143, K 4.0, Bun 20, creat 0.79, eGFR 72, wbc 7.1, Hgb 10.9, plt 172 10/28/18 Na 142, K 4.0, Bun 19,  creat 0.77, eGFR 74 11/18/18 Na 144, K 4.1, Bun 21, creat 0.69, eGFR 83  . Bilateral lower extremity edema 07/19/2018   11/02/18 BMP 2 weeks.   . Cervical pain (neck) 06/20/2010  . Closed fracture of part of upper end of humerus 05/01/2015  . Colles' fracture of right radius 03/05/2015  . Constipation 07/19/2018  . DEGENERATIVE JOINT DISEASE, RIGHT HIP 02/16/2007  . Dupuytren's contracture   . Dysuria 09/20/2018   09/20/18 c/o got up several times last night to go urinate, burning on urination, lower abd /back discomfort, but urinary frequency, leakage are not new. UA C/S, Pyridium 113m tid x 2 days.  09/21/18 wbc 6.1, Hgb 11.8, plt 210, neutrophil 69.4, Na 142, K 4.1, Bun 19, creat 0.78, TP 6.4, albumin 3.9  . Hematuria 02/04/2014  . Hypotension 09/18/2018  . Long term (current) use of anticoagulants 05/29/2016  . Osteoarthritis of hip    right  . Osteoarthritis of left knee   . Overactive bladder 10/06/2018  . Parkinson disease ([place]   . Paroxysmal A-fib ([place]   . POSTMENOPAUSAL SYNDROME 02/16/2007  . PREMATURE ATRIAL CONTRACTIONS 02/16/2007  . 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 S/P breast biopsy, left    two o'clock position - benign  . 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 Tremor, unspecified 10/19/2015  . Unstable gait 02/16/2017  . Vaginal atrophy 10/19/2015  . Venous insufficiency   . Weakness 09/12/2018   Past Surgical History:  Procedure Laterality Date  . BREAST BIOPSY Left   . CATARACT EXTRACTION, BILATERAL      Allergies  Allergen Reactions  . Doxycycline Nausea And Vomiting  . Sulfonamide Derivatives     REACTION: HIVES    Allergies as of 04/04/2019      Reactions   Doxycycline Nausea And Vomiting   Sulfonamide Derivatives    REACTION: HIVES      Medication List       Accurate as of April 04, 2019 11:36 AM. If you have any questions, ask your nurse or doctor.        STOP taking these medications   hydrocortisone 25 MG suppository Commonly  known as: ANUSOL-HC Stopped by: Alahni Varone X Serrena Linderman, NP   meloxicam 15 MG tablet Commonly known as: MOBIC Stopped by: Duayne Brideau X Bijou Easler, NP     TAKE these medications   acetaminophen 500 MG tablet Commonly known as: TYLENOL Take 1,000 mg by mouth 3 (three) times daily. Not to exceed 3000 mg in 24 ( scheduled + Prn doses   acetaminophen 500 MG tablet Commonly known as: TYLENOL Take 500 mg by mouth every 8 (eight) hours as needed. Not to exceed 3 gm in 24 hours between scheduled and prn doses   baclofen 10 MG tablet Commonly known as: LIORESAL Take 10 mg by mouth as needed.   calcium carbonate 750 MG chewable tablet Commonly known as: TUMS EX Chew 1 tablet by mouth daily. Between the hours of 7am-10am.   carbidopa-levodopa 25-100 MG tablet Commonly known as: SINEMET IR Take 2 tablets by mouth in the morning, at noon, in the evening, and at bedtime. Morning: 7am, MidDay 11am, Afternoon 3pm, Evening 7pm.   carbidopa-levodopa 50-200 MG tablet Commonly known as: SINEMET CR Take 1 tablet by mouth at bedtime. 9pm   Clinpro 5000 1.1 % Pste Generic drug: Sodium Fluoride Place onto teeth at bedtime. Between the hours of 7pm-10pm.   diltiazem 120 MG 24 hr capsule Commonly known as: CARDIZEM CD Take 120 mg by mouth daily. Between the hours of 7am-10am. HOLD IF SBP < 100   ferrous sulfate 325 (65 FE) MG tablet Take 325 mg by mouth. Once a day on Mon, Fri   HYDROcodone-acetaminophen 5-325 MG tablet Commonly known as: NORCO/VICODIN Give 1/2 tablet by mouth every 4 hours prn for right hip pain X 14 days   metolazone 2.5 MG tablet Commonly known as: ZAROXOLYN Take 2.5 mg by mouth. Once A Day on Mon, and Thurs at 7:30am. . Hold if systolic blood pressure less than 100   Myrbetriq 50 MG Tb24 tablet Generic drug: mirabegron ER Take 50 mg by mouth daily. Between the hours of 7am-10am.   polyethylene glycol 17 g packet Commonly known as: MIRALAX / GLYCOLAX Take 17 g by mouth daily.     rivaroxaban 20 MG Tabs tablet Commonly known as: XARELTO Take 20 mg by mouth every evening. Between the hours of 5pm-6pm.   sennosides-docusate sodium 8.6-50 MG tablet Commonly known as: SENOKOT-S Take 1 tablet by mouth at bedtime. Between the hours of 7pm-10pm.   Vitamin D3 10 MCG (400 UNIT) Caps Take 1 capsule by mouth daily. Between the hours of 7am-10am.   zinc oxide 20 % ointment Apply 1 application topically as needed for irritation. To buttocks after every incontinent episode and as needed for redness. May keep at bedside.       Review of Systems  Immunization History  Administered Date(s) Administered  . Influenza Split 10/20/2012  .  Influenza Whole 10/21/2006  . Influenza, High Dose Seasonal PF 10/17/2016  . Influenza-Unspecified 10/20/2013, 10/22/2017  . Moderna SARS-COVID-2 Vaccination 01/22/2019, 02/19/2019  . Pneumococcal Conjugate-13 01/31/2013  . Pneumococcal Polysaccharide-23 01/20/2005, 11/24/2011  . Td 01/21/2004  . Tdap 03/16/2014  . Zoster 04/17/2009   Pertinent  Health Maintenance Due  Topic Date Due  . COLONOSCOPY  06/12/2019  . INFLUENZA VACCINE  Completed  . DEXA SCAN  Completed  . PNA vac Low Risk Adult  Completed   Fall Risk  05/03/2018 11/03/2017 08/04/2017 03/06/2017 11/26/2016  Falls in the past year? 1 No No Yes Yes  Number falls in past yr: 0 - - 2 or more 2 or more  Injury with Fall? 0 - - No No  Risk Factor Category  - - - High Fall Risk High Fall Risk  Risk for fall due to : Other (Comment) - - - -  Follow up Falls evaluation completed;Education provided;Falls prevention discussed - - Falls evaluation completed Falls evaluation completed   Functional Status Survey:    Vitals:   04/04/19 1121  BP: (!) 94/54  Pulse: 85  Resp: 18  Temp: 97.6 F (36.4 C)  SpO2: 96%  Weight: 134 lb 1.6 oz (60.8 kg)  Height: '5\' 8"'  (1.727 m)   Body mass index is 20.39 kg/m. Physical Exam  Labs reviewed: Recent Labs    09/12/18 0304  09/12/18 0304 09/13/18 0201 09/13/18 0201 09/14/18 0043 09/21/18 0000 11/18/18 0000 12/09/18 0000 03/24/19 0000  NA 140   < > 140   < > 140   < > 144 140 141  K 3.6   < > 3.8   < > 3.9   < > 4.1 3.9 4.4  CL 108   < > 112*   < > 111   < > 110* 103 107  CO2 21*   < > 22   < > 23   < > 27* 29* 28*  GLUCOSE 89  --  95  --  109*  --   --   --   --   BUN 27*   < > 24*   < > 15   < > 21 24* 26*  CREATININE 0.88   < > 0.75   < > 0.68   < > 0.7 0.7 0.7  CALCIUM 9.2   < > 8.4*   < > 8.5*   < > 9.3 9.4 9.5  MG  --   --  2.0  --   --   --   --   --   --    < > = values in this interval not displayed.   Recent Labs    09/12/18 0304 09/12/18 0304 09/13/18 0201 09/13/18 0201 09/21/18 0000 12/09/18 0000 03/24/19 0000  AST 60*   < > 50*   < > 20 16 11*  ALT 7   < > 7   < > 20 10 5*  ALKPHOS 45   < > 36*   < > 42 47 50  BILITOT 1.1  --  0.6  --   --   --   --   PROT 6.2*  --  4.9*  --  6.4  --   --   ALBUMIN 3.7   < > 2.8*  --  3.9 3.8 4.0   < > = values in this interval not displayed.   Recent Labs    09/11/18 2105 09/11/18 2105 09/12/18 0304 09/12/18 0304 09/13/18 0201  09/21/18 0000 10/14/18 0000 12/09/18 0000 03/24/19 0000  WBC 10.6*   < > 8.0   < > 8.9   < > 5.4 5.5 8.4  NEUTROABS 9.4*   < >  --   --  7.1  --   --  3,718 6,602  HGB 12.6   < > 11.5*   < > 10.5*   < > 10.1* 11.7* 10.9*  HCT 40.1   < > 35.2*   < > 32.4*   < > 31* 37 34*  MCV 96.4  --  94.4  --  93.9  --   --   --   --   PLT 170   < > 150   < > 125*   < > 184 189 190   < > = values in this interval not displayed.   Lab Results  Component Value Date   TSH 0.408 09/11/2018   No results found for: HGBA1C Lab Results  Component Value Date   CHOL 185 03/24/2019   HDL 79 (A) 03/24/2019   LDLCALC 94 03/24/2019   LDLDIRECT 104.2 01/25/2013   TRIG 41 03/24/2019   CHOLHDL 2 08/29/2015    Significant Diagnostic Results in last 30 days:  MR Hip Right w/o contrast  Result Date: 03/07/2019 CLINICAL DATA:   Chronic right hip pain. EXAM: MR OF THE RIGHT HIP WITHOUT CONTRAST TECHNIQUE: Multiplanar, multisequence MR imaging was performed. No intravenous contrast was administered. COMPARISON:  None. FINDINGS: Both hips are normally located. Moderate degenerative changes bilaterally but no stress fracture or AVN. No hip joint effusion. No periarticular fluid collections to suggest a paralabral cyst. Mild bilateral peritendinosis most notably involving the right gluteus medius tendon. No findings for trochanteric bursitis. The pubic symphysis and SI joints are intact. No pelvic fractures or bone lesions. High-grade partial-thickness tearing of the iliopsoas tendon distally. No complete tear/rupture but possibly at risk for such. Mild edema like signal changes in the gluteus minimus and medius tendons suggesting mild muscle strain or partial tear. The hamstring tendons are intact. No significant intrapelvic abnormalities are identified. Small benign-appearing cystic structure superior to the bladder. IMPRESSION: 1. Moderate bilateral hip joint degenerative changes but no stress fracture or AVN. 2. No significant intrapelvic abnormalities. 3. High-grade partial-thickness tearing of the iliopsoas tendon distally. No complete tear/rupture but possibly at risk for such. 4. Mild bilateral peritendinosis most notably involving the right gluteus medius tendon. Electronically Signed   By: Marijo Sanes M.D.   On: 03/07/2019 15:03   US Guided Needle Placement - No Linked Charges  Result Date: 03/15/2019 Please see Notes tab for imaging impression.   Assessment/Plan 1. Anemia, unspecified ty  2. Gastroesophageal reflux disease, unspecified whether esophagitis prese  3. Parkinsonism, unspecified Parkinsonism type ([place])   4. Pain in right hip     Family/ staff Communication:   Labs/tests ordered:   This encounter was created in error - please disregard.

## 2019-04-05 ENCOUNTER — Non-Acute Institutional Stay (SKILLED_NURSING_FACILITY): Payer: Medicare PPO | Admitting: Internal Medicine

## 2019-04-05 ENCOUNTER — Encounter: Payer: Self-pay | Admitting: Internal Medicine

## 2019-04-05 DIAGNOSIS — I4819 Other persistent atrial fibrillation: Secondary | ICD-10-CM

## 2019-04-05 DIAGNOSIS — D649 Anemia, unspecified: Secondary | ICD-10-CM

## 2019-04-05 DIAGNOSIS — M25551 Pain in right hip: Secondary | ICD-10-CM

## 2019-04-05 DIAGNOSIS — R600 Localized edema: Secondary | ICD-10-CM

## 2019-04-05 DIAGNOSIS — G20 Parkinson's disease: Secondary | ICD-10-CM

## 2019-04-05 MED ORDER — OXYCODONE HCL 5 MG PO TABS: 2.5000 mg | ORAL_TABLET | Freq: Three times a day (TID) | ORAL | 0 refills | Status: AC | PRN

## 2019-04-05 NOTE — Progress Notes (Addendum)
Location:   [facility] Room Number: [place] Proper of Service:  SNF [phone]) Provider:  [name], MD  [name], MD  Patient Care Team: [name], MD as PCP - General (Internal Medicine) [name], [name], MD as PCP - Cardiology (Cardiology) [name], [name], DO as Consulting Physician (Neurology) Mast, Man X, NP as Nurse Practitioner (Internal Medicine)  Extended Emergency Contact Information Primary Emergency Contact: Ilona, Colley Mobile Phone: [phone] Relation: Daughter Interpreter needed? No Secondary Emergency Contact: [facility] Address: [address]          [place], [place] [address] [name] of [place] Phone: [phone] Mobile Phone: [phone] Relation: Son  Code Status:  DNR Goals of care: Advanced Directive information Advanced Directives 04/05/2019  Does Patient Have a Medical Advance Directive? Yes  Type of Paramedic of [place];Out of facility DNR (pink MOST or yellow form)  Does patient want to make changes to medical advance directive? No - Patient declined  Copy of [place] in Chart? Yes - validated most recent copy scanned in chart (See row information)  Would patient like information on creating a medical advance directive? -  Pre-existing out of facility DNR order (yellow form or pink MOST form) -     Chief Complaint  Patient presents with   Acute Visit    Pain Control/ Anemia    HPI:  Pt is a 79 y.o. female seen today for an acute visit for Pain Control and Anemia  Patient has a history of Parkinson disease diagnosed 2 years ago on Sinemet and follows withNeurology,paroxysmal A. fib on Xarelto, hyperlipidemia, H/o Ovarian Cyst, Lower extremity edema,2 D echo with N EF Biatrial enlargementand Urinary Incontinence  Recent Active issues Right hip pain radiating to the groin area with muscle spasm MRI showed moderate right hip arthritis and  iliopsoas tear Dr. Erlinda Hong had suggested surgery but patient got another opinion from Dr. Lyla Glassing.  He has suggested for her to see Dr. Nelva Bush  He also started her on baclofen.  Patient had severe adverse reaction to that.  She became lethargic.  Unable to do her ADLs.  Her baclofen was stopped.  She is back to her baseline today.  She says her pain is controlled with Tylenol and does not want to try anything new at this time.    Anemia Patient did have 2 episodes  of  blood per rectum possible hemorrhoids.  But her hemoglobin came down to 8.9.  She is guaiac negative x2 Has not noticed any bleeding since then    Past Medical History:  Diagnosis Date   Acute lower UTI 09/14/2018   Anemia 10/15/2018   2016 colonoscopy 10/14/18 wbc 5.4, Hgb 10.1, plt 184 12/01/18 wbc 4.5, Hgb 11.1, plt 188, neutrophils 63,  Na 139, K 3.7, Bun 23, creat 0.69, eGFR 83 on Fe, Hgb 11.7 12/09/18    Atrial fibrillation (HCC) 05/21/2010   10/07/18 Na 143, K 4.0, Bun 20, creat 0.79, eGFR 72, wbc 7.1, Hgb 10.9, plt 172 10/28/18 Na 142, K 4.0, Bun 19, creat 0.77, eGFR 74 11/18/18 Na 144, K 4.1, Bun 21, creat 0.69, eGFR 83   Bilateral lower extremity edema 07/19/2018   11/02/18 BMP 2 weeks.    Cervical pain (neck) 06/20/2010   Closed fracture of part of upper end of humerus 05/01/2015   Colles' fracture of right radius 03/05/2015   Constipation 07/19/2018   DEGENERATIVE JOINT DISEASE, RIGHT HIP 02/16/2007  Dupuytren's contracture    Dysuria 09/20/2018   09/20/18 c/o got up several times last night to go urinate, burning on urination, lower abd /back discomfort, but urinary frequency, leakage are not new. UA C/S, Pyridium 162m tid x 2 days.  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   S/P breast biopsy, left    two o'clock position - benign   Toxic effect of venom(989.5) 07/27/2007   Tremor, unspecified 10/19/2015   Unstable gait 02/16/2017   Vaginal atrophy 10/19/2015   Venous insufficiency    Weakness 09/12/2018   Past Surgical History:  Procedure Laterality Date   BREAST BIOPSY Left    CATARACT EXTRACTION, BILATERAL      Allergies  Allergen Reactions   Doxycycline Nausea And Vomiting   Sulfonamide Derivatives     REACTION: HIVES    Allergies as of 04/05/2019      Reactions   Doxycycline Nausea And Vomiting   Sulfonamide Derivatives    REACTION: HIVES      Medication List       Accurate as of April 05, 2019 10:46 AM. If you have any questions, ask your nurse or doctor.        STOP taking these medications   baclofen 10 MG tablet Commonly known as: LIORESAL Stopped by: AVirgie Dad MD     TAKE these medications   acetaminophen 500 MG tablet Commonly known as: TYLENOL Take 1,000 mg by mouth 3 (three) times daily. Not to exceed 3000 mg in 24 ( scheduled + Prn doses   acetaminophen 500 MG tablet Commonly known as: TYLENOL Take 500 mg by mouth every 8 (eight) hours as needed. Not to exceed 3 gm in 24 hours between scheduled and prn doses   calcium carbonate 750 MG chewable tablet Commonly known as: TUMS EX Chew 1 tablet by mouth daily. Between the hours of 7am-10am.   carbidopa-levodopa 25-100 MG tablet Commonly known as: SINEMET IR Take 2 tablets by mouth in the morning, at noon, in the evening, and at bedtime. Morning: 7am, MidDay 11am, Afternoon 3pm, Evening 7pm.   carbidopa-levodopa 50-200 MG tablet Commonly known as: SINEMET CR Take 1 tablet by mouth at bedtime. 9pm   Clinpro 5000 1.1 % Pste Generic  drug: Sodium Fluoride Place onto teeth at bedtime. Between the hours of 7pm-10pm.   diltiazem 120 MG 24 hr capsule Commonly known as: CARDIZEM CD Take 120 mg by mouth daily. Between the hours of 7am-10am. HOLD IF SBP < 100   ferrous sulfate 325 (65 FE) MG tablet Take 325 mg by mouth. Once a day on Mon, Fri   HYDROcodone-acetaminophen 5-325 MG tablet Commonly known as: NORCO/VICODIN Give 1/2 tablet by mouth every 4 hours prn for right hip pain X 14 days   hydrocortisone 2.5 % ointment Apply 1 application topically at bedtime. At bedtime for 1 week then change to PRN.   metolazone 2.5 MG tablet Commonly known as: ZAROXOLYN Take 2.5 mg by mouth. Once A Day on Mon, and Thurs  at 7:30am. . Hold if systolic blood pressure less than 100   Myrbetriq 50 MG Tb24 tablet Generic drug: mirabegron ER Take 50 mg by mouth daily. Between the hours of 7am-10am.   omeprazole 20 MG capsule Commonly known as: PRILOSEC Take 20 mg by mouth daily.   polyethylene glycol 17 g packet Commonly known as: MIRALAX / GLYCOLAX Take 17 g by mouth daily.   rivaroxaban 20 MG Tabs tablet Commonly known as: XARELTO Take 20 mg by mouth every evening. Between the hours of 5pm-6pm.   sennosides-docusate sodium 8.6-50 MG tablet Commonly known as: SENOKOT-S Take 1 tablet by mouth at bedtime. Between the hours of 7pm-10pm.   Vitamin D3 10 MCG (400 UNIT) Caps Take 1 capsule by mouth daily. Between the hours of 7am-10am.   zinc oxide 20 % ointment Apply 1 application topically as needed for irritation. To buttocks after every incontinent episode and as needed for redness. May keep at bedside.       Review of Systems  Constitutional: Positive for activity change.  HENT: Negative.   Respiratory: Negative.   Cardiovascular: Positive for leg swelling.  Gastrointestinal: Negative.   Genitourinary: Negative.   Musculoskeletal: Positive for gait problem.  Neurological: Positive for weakness.    Psychiatric/Behavioral: Negative.     Immunization History  Administered Date(s) Administered   Influenza Split 10/20/2012   Influenza Whole 10/21/2006   Influenza, High Dose Seasonal PF 10/17/2016   Influenza-Unspecified 10/20/2013, 10/22/2017   Moderna SARS-COVID-2 Vaccination 01/22/2019, 02/19/2019   Pneumococcal Conjugate-13 01/31/2013   Pneumococcal Polysaccharide-23 01/20/2005, 11/24/2011   Td 01/21/2004   Tdap 03/16/2014   Zoster 04/17/2009   Pertinent  Health Maintenance Due  Topic Date Due   COLONOSCOPY  06/12/2019   INFLUENZA VACCINE  Completed   DEXA SCAN  Completed   PNA vac Low Risk Adult  Completed   Fall Risk  05/03/2018 11/03/2017 08/04/2017 03/06/2017 11/26/2016  Falls in the past year? 1 No No Yes Yes  Number falls in past yr: 0 - - 2 or more 2 or more  Injury with Fall? 0 - - No No  Risk Factor Category  - - - High Fall Risk High Fall Risk  Risk for fall due to : Other (Comment) - - - -  Follow up Falls evaluation completed;Education provided;Falls prevention discussed - - Falls evaluation completed Falls evaluation completed   Functional Status Survey:    Vitals:   04/05/19 1023  BP: (!) 94/54  Pulse: 85  Resp: 18  Temp: 97.6 F (36.4 C)  SpO2: 95%  Weight: 134 lb (60.8 kg)  Height: '5\' 8"'  (1.727 m)   Body mass index is 20.37 kg/m. Physical Exam Vitals reviewed.  Constitutional:      Appearance: Normal appearance.  HENT:     Head: Normocephalic.     Nose: Nose normal.     Mouth/Throat:     Mouth: Mucous membranes are moist.     Pharynx: Oropharynx is clear.  Eyes:     Pupils: Pupils are equal, round, and reactive to light.  Cardiovascular:     Rate and Rhythm: Normal rate. Rhythm irregular.     Pulses: Normal pulses.  Pulmonary:     Effort: Pulmonary effort is normal. No respiratory distress.     Breath sounds: Normal breath sounds. No wheezing or rales.  Abdominal:     General: Abdomen is flat. Bowel sounds are normal.      Palpations: Abdomen is soft.  Musculoskeletal:  Cervical back: Neck supple.     Comments: Mild Edema Bilateral  Skin:    General: Skin is warm.  Neurological:     General: No focal deficit present.     Mental Status: She is alert and oriented to person, place, and time.  Psychiatric:        Mood and Affect: Mood normal.        Labs reviewed: Recent Labs    09/12/18 0304 09/12/18 0304 09/13/18 0201 09/13/18 0201 09/14/18 0043 09/21/18 0000 12/09/18 0000 03/24/19 0000 04/01/19 0000  NA 140   < > 140   < > 140   < > 140 141 140  K 3.6   < > 3.8   < > 3.9   < > 3.9 4.4 4.8  CL 108   < > 112*   < > 111   < > 103 107 104  CO2 21*   < > 22   < > 23   < > 29* 28* 31*  GLUCOSE 89  --  95  --  109*  --   --   --   --   BUN 27*   < > 24*   < > 15   < > 24* 26* 33*  CREATININE 0.88   < > 0.75   < > 0.68   < > 0.7 0.7 1.0  CALCIUM 9.2   < > 8.4*   < > 8.5*   < > 9.4 9.5 9.3  MG  --   --  2.0  --   --   --   --   --   --    < > = values in this interval not displayed.   Recent Labs    09/12/18 0304 09/12/18 0304 09/13/18 0201 09/13/18 0201 09/21/18 0000 12/09/18 0000 03/24/19 0000 04/01/19 0000  AST 60*   < > 50*   < > 20 16 11* 12*  ALT 7   < > 7   < > 20 10 5*  --   ALKPHOS 45   < > 36*   < > 42 47 50 48  BILITOT 1.1  --  0.6  --   --   --   --   --   PROT 6.2*  --  4.9*  --  6.4  --   --   --   ALBUMIN 3.7   < > 2.8*   < > 3.9 3.8 4.0 3.8   < > = values in this interval not displayed.   Recent Labs    09/11/18 2105 09/11/18 2105 09/12/18 0304 09/12/18 0304 09/13/18 0201 09/21/18 0000 12/09/18 0000 03/24/19 0000 04/01/19 0000  WBC 10.6*   < > 8.0   < > 8.9   < > 5.5 8.4 8.2  NEUTROABS 9.4*   < >  --   --  7.1  --  3,718 6,602  --   HGB 12.6   < > 11.5*   < > 10.5*   < > 11.7* 10.9* 8.9*  HCT 40.1   < > 35.2*   < > 32.4*   < > 37 34* 28*  MCV 96.4  --  94.4  --  93.9  --   --   --   --   PLT 170   < > 150   < > 125*   < > 189 190 170   < > = values  in this interval not displayed.  Lab Results  Component Value Date   TSH 0.408 09/11/2018   No results found for: HGBA1C Lab Results  Component Value Date   CHOL 185 03/24/2019   HDL 79 (A) 03/24/2019   LDLCALC 94 03/24/2019   LDLDIRECT 104.2 01/25/2013   TRIG 41 03/24/2019   CHOLHDL 2 08/29/2015    Significant Diagnostic Results in last 30 days:  MR Hip Right w/o contrast  Result Date: 03/07/2019 CLINICAL DATA:  Chronic right hip pain. EXAM: MR OF THE RIGHT HIP WITHOUT CONTRAST TECHNIQUE: Multiplanar, multisequence MR imaging was performed. No intravenous contrast was administered. COMPARISON:  None. FINDINGS: Both hips are normally located. Moderate degenerative changes bilaterally but no stress fracture or AVN. No hip joint effusion. No periarticular fluid collections to suggest a paralabral cyst. Mild bilateral peritendinosis most notably involving the right gluteus medius tendon. No findings for trochanteric bursitis. The pubic symphysis and SI joints are intact. No pelvic fractures or bone lesions. High-grade partial-thickness tearing of the iliopsoas tendon distally. No complete tear/rupture but possibly at risk for such. Mild edema like signal changes in the gluteus minimus and medius tendons suggesting mild muscle strain or partial tear. The hamstring tendons are intact. No significant intrapelvic abnormalities are identified. Small benign-appearing cystic structure superior to the bladder. IMPRESSION: 1. Moderate bilateral hip joint degenerative changes but no stress fracture or AVN. 2. No significant intrapelvic abnormalities. 3. High-grade partial-thickness tearing of the iliopsoas tendon distally. No complete tear/rupture but possibly at risk for such. 4. Mild bilateral peritendinosis most notably involving the right gluteus medius tendon. Electronically Signed   By: Marijo Sanes M.D.   On: 03/07/2019 15:03   US Guided Needle Placement - No Linked Charges  Result Date:  03/15/2019 Please see Notes tab for imaging impression.   Assessment/Plan Anemia, unspecified type Is occult negative x2 Meloxicam discontinued started on omeprazole Will increase her iron to 3 times a week Add iron studies B12 was normal few months ago Repeat CBC Had colonoscopy in 2016  Pain in right hip Discussed in detail with patient's daughter who wanted Patient to be referred to palliative care for pain management.  Patient at this time does not want anything stronger than Tylenol We will start her on oxycodone 2.5 mg every 8 hours as needed Discontinue Norco and PRN Tylenol.   Continue the standing dose of Tylenol Robaxin to 250 mg daily as needed Daughter has agreed to hold off the palliative care for few more weeks Patient  does have appointment with Dr. Nelva Bush She  will continue working with therapy Persistent atrial fibrillation ([place]) On Cardizem and Xarelto Her blood pressure is very tenuous Denies any dizziness  Parkinsonism, unspecified Parkinsonism type ([place]) On Sinemet Bilateral lower extremity edema Doing well on Zaroxolyn Urinary incontinence Continue on Myrbetriq  Family/ staff Communication:   Labs/tests ordered:    Total time spent in this patient care encounter was  45_  minutes; greater than 50% of the visit spent counseling patient and staff, reviewing records , Labs and coordinating care for problems addressed at this encounter.

## 2019-04-06 ENCOUNTER — Encounter: Payer: Self-pay | Admitting: Gastroenterology

## 2019-04-06 ENCOUNTER — Encounter: Payer: Self-pay | Admitting: Nurse Practitioner

## 2019-04-06 DIAGNOSIS — Z20828 Contact with and (suspected) exposure to other viral communicable diseases: Secondary | ICD-10-CM | POA: Diagnosis not present

## 2019-04-07 DIAGNOSIS — D649 Anemia, unspecified: Secondary | ICD-10-CM | POA: Diagnosis not present

## 2019-04-07 LAB — CBC: RBC: 2.95 — AB (ref 3.87–5.11)

## 2019-04-07 LAB — CBC AND DIFFERENTIAL
HCT: 29 — AB (ref 36–46)
Hemoglobin: 9.3 — AB (ref 12.0–16.0)
Platelets: 199 (ref 150–399)
WBC: 6.6

## 2019-04-12 ENCOUNTER — Ambulatory Visit: Payer: Medicare PPO | Admitting: Orthopaedic Surgery

## 2019-04-13 ENCOUNTER — Non-Acute Institutional Stay (SKILLED_NURSING_FACILITY): Payer: Medicare PPO | Admitting: Nurse Practitioner

## 2019-04-13 ENCOUNTER — Encounter: Payer: Self-pay | Admitting: Nurse Practitioner

## 2019-04-13 DIAGNOSIS — R1310 Dysphagia, unspecified: Secondary | ICD-10-CM

## 2019-04-13 DIAGNOSIS — G20 Parkinson's disease: Secondary | ICD-10-CM | POA: Diagnosis not present

## 2019-04-13 DIAGNOSIS — R600 Localized edema: Secondary | ICD-10-CM | POA: Diagnosis not present

## 2019-04-13 DIAGNOSIS — K5900 Constipation, unspecified: Secondary | ICD-10-CM

## 2019-04-13 DIAGNOSIS — K219 Gastro-esophageal reflux disease without esophagitis: Secondary | ICD-10-CM

## 2019-04-13 DIAGNOSIS — N3281 Overactive bladder: Secondary | ICD-10-CM

## 2019-04-13 DIAGNOSIS — D649 Anemia, unspecified: Secondary | ICD-10-CM

## 2019-04-13 DIAGNOSIS — I4819 Other persistent atrial fibrillation: Secondary | ICD-10-CM | POA: Diagnosis not present

## 2019-04-13 NOTE — Assessment & Plan Note (Signed)
Minimal, continue Metolazone 2x/week

## 2019-04-13 NOTE — Assessment & Plan Note (Signed)
Stable, continue Omeprazole.  

## 2019-04-13 NOTE — Assessment & Plan Note (Signed)
Mechanical soft

## 2019-04-13 NOTE — Assessment & Plan Note (Signed)
Stable, Hgb 9.3 04/07/19, continue Fe

## 2019-04-13 NOTE — Assessment & Plan Note (Signed)
Stable, started Myrbetriq 50mg  qd 01/28/19 per Urology.

## 2019-04-13 NOTE — Assessment & Plan Note (Signed)
Pain in the right hip is better, continue Tylenol, f/u Ortho, activity as tolerated.

## 2019-04-13 NOTE — Assessment & Plan Note (Signed)
Heart rate is in control, continue Diltiazem, Xarelto.

## 2019-04-13 NOTE — Progress Notes (Signed)
Location:   [facility] Room Number: 13 Place of Service:  SNF (31) Provider:  Danayah Smyre NP  [name], MD  Patient Care Team: [name], MD as PCP - General (Internal Medicine) [name], [name], MD as PCP - Cardiology (Cardiology) [name], [name], DO as Consulting Physician (Neurology) Kameo Bains X, NP as Nurse Practitioner (Internal Medicine)  Extended Emergency Contact Information Primary Emergency Contact: Jazlin, [place] Mobile Phone: [phone] Relation: Daughter Interpreter needed? No Secondary Emergency Contact: [facility] Address: [address]          [address], [place] [address] [name] of [place] Phone: [phone] Mobile Phone: [phone] Relation: Son  Code Status:  DNR Goals of care: Advanced Directive information Advanced Directives 04/13/2019  Does Patient Have a Medical Advance Directive? Yes  Type of Advance Directive [place]  Does patient want to make changes to medical advance directive? No - Patient declined  Copy of [place] in Chart? Yes - validated most recent copy scanned in chart (See row information)  Would patient like information on creating a medical advance directive? -  Pre-existing out of facility DNR order (yellow form or pink MOST form) -     Chief Complaint  Patient presents with  . Medical Management of Chronic Issues    HPI:  Pt is a 79 y.o. female seen today for medical management of chronic diseases.    The patient resides in [facility] for safety, care assistance, right hip pain is improved on Tylenol 1033m tid, prn Oxycodone,  Methocarbamol not used, f/u Ortho 04/15/19. Constipation, stable, on Senokot S I qd, MiraLax qd. AFib, heart rate is in control, on Xarelto 261mqd, Diltiazem 12046md. Anemia, stable, on Fe. GERD, stable, on Omeprazole 27m78m. Urinary frequency, stable, on Mirabegron 50mg42m BLE edema, mildly diuresed, on Metolazone  2.5mg 264mk.    Past Medical History:  Diagnosis Date  . Acute lower UTI 09/14/2018  . Anemia 10/15/2018   2016 colonoscopy 10/14/18 wbc 5.4, Hgb 10.1, plt 184 12/01/18 wbc 4.5, Hgb 11.1, plt 188, neutrophils 63,  Na 139, K 3.7, Bun 23, creat 0.69, eGFR 83 on Fe, Hgb 11.7 12/09/18   . Atrial fibrillation (HCC) [id number]   10/07/18 Na 143, K 4.0, Bun 20, creat 0.79, eGFR 72, wbc 7.1, Hgb 10.9, plt 172 10/28/18 Na 142, K 4.0, Bun 19, creat 0.77, eGFR 74 11/18/18 Na 144, K 4.1, Bun 21, creat 0.69, eGFR 83  . Bilateral lower extremity edema 07/19/2018   11/02/18 BMP 2 weeks.   . Cervical pain (neck) 06/20/2010  . Closed fracture of part of upper end of humerus 05/01/2015  . Colles' fracture of right radius 03/05/2015  . Constipation 07/19/2018  . DEGENERATIVE JOINT DISEASE, RIGHT HIP 02/16/2007  . Dupuytren's contracture   . Dysuria 09/20/2018   09/20/18 c/o got up several times last night to go urinate, burning on urination, lower abd /back discomfort, but urinary frequency, leakage are not new. UA C/S, Pyridium 100mg t40m 2 days.  09/21/18 wbc 6.1, Hgb 11.8, plt 210, neutrophil 69.4, Na 142, K 4.1, Bun 19, creat 0.78, TP 6.4, albumin 3.9  . Hematuria 02/04/2014  . Hypotension 09/18/2018  . Long term (current) use of anticoagulants 05/29/2016  . Osteoarthritis of hip    right  . Osteoarthritis of left knee   . Overactive bladder 10/06/2018  . Parkinson disease (HCC)   [place]   [facility]  POSTMENOPAUSAL SYNDROME 02/16/2007  . PREMATURE ATRIAL CONTRACTIONS 02/16/2007  . 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 S/P breast biopsy, left    two o'clock position - benign  . 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 Tremor, unspecified 10/19/2015  . Unstable gait 02/16/2017  . Vaginal atrophy 10/19/2015  . Venous insufficiency   . Weakness 09/12/2018   Past Surgical History:  Procedure Laterality Date  . BREAST BIOPSY Left   . CATARACT EXTRACTION, BILATERAL      Allergies  Allergen  Reactions  . Doxycycline Nausea And Vomiting  . Sulfonamide Derivatives     REACTION: HIVES    Allergies as of 04/13/2019      Reactions   Doxycycline Nausea And Vomiting   Sulfonamide Derivatives    REACTION: HIVES      Medication List       Accurate as of April 13, 2019 12:51 PM. If you have any questions, ask your nurse or doctor.        acetaminophen 500 MG tablet Commonly known as: TYLENOL Take 1,000 mg by mouth 3 (three) times daily. Not to exceed 3000 mg in 24 ( scheduled + Prn doses What changed: Another medication with the same name was removed. Continue taking this medication, and follow the directions you see here. Changed by: Carline Dura X Ura Hausen, NP   calcium carbonate 750 MG chewable tablet Commonly known as: TUMS EX Chew 1 tablet by mouth daily. Between the hours of 7am-10am.   carbidopa-levodopa 25-100 MG tablet Commonly known as: SINEMET IR Take 2 tablets by mouth in the morning, at noon, in the evening, and at bedtime. Morning: 7am, MidDay 11am, Afternoon 3pm, Evening 7pm.   carbidopa-levodopa 50-200 MG tablet Commonly known as: SINEMET CR Take 1 tablet by mouth at bedtime. 9pm   Clinpro 5000 1.1 % Pste Generic drug: Sodium Fluoride Place onto teeth at bedtime. Between the hours of 7pm-10pm.   diltiazem 120 MG 24 hr capsule Commonly known as: CARDIZEM CD Take 120 mg by mouth daily. Between the hours of 7am-10am. HOLD IF SBP < 100   ferrous sulfate 325 (65 FE) MG tablet Take 325 mg by mouth. Once a day on Mon, Fri   hydrocortisone 2.5 % ointment Apply 1 application topically at bedtime. At bedtime for 1 week then change to PRN.   methocarbamol 500 MG tablet Commonly known as: ROBAXIN Take 250 mg by mouth daily. As needed   metolazone 2.5 MG tablet Commonly known as: ZAROXOLYN Take 2.5 mg by mouth. Once A Day on Mon, and Thurs at 7:30am. . Hold if systolic blood pressure less than 100   Myrbetriq 50 MG Tb24 tablet Generic drug: mirabegron ER Take 50  mg by mouth daily. Between the hours of 7am-10am.   omeprazole 20 MG capsule Commonly known as: PRILOSEC Take 20 mg by mouth daily.   oxyCODONE 5 MG immediate release tablet Commonly known as: Oxy IR/ROXICODONE Take 0.5 tablets (2.5 mg total) by mouth every 8 (eight) hours as needed for up to 14 days for severe pain.   polyethylene glycol 17 g packet Commonly known as: MIRALAX / GLYCOLAX Take 17 g by mouth daily.   rivaroxaban 20 MG Tabs tablet Commonly known as: XARELTO Take 20 mg by mouth every evening. Between the hours of 5pm-6pm.   sennosides-docusate sodium 8.6-50 MG tablet Commonly known as: SENOKOT-S Take 1 tablet by mouth at bedtime. Between the hours of 7pm-10pm.   Vitamin D3 10 MCG (400 UNIT) Caps Take 1  capsule by mouth daily. Between the hours of 7am-10am.   zinc oxide 20 % ointment Apply 1 application topically as needed for irritation. To buttocks after every incontinent episode and as needed for redness. May keep at bedside.       Review of Systems  Constitutional: Negative for activity change, appetite change, fatigue and fever.  HENT: Positive for hearing loss and trouble swallowing. Negative for congestion and voice change.   Eyes: Negative for visual disturbance.  Respiratory: Negative for cough and shortness of breath.   Cardiovascular: Positive for leg swelling. Negative for chest pain and palpitations.  Gastrointestinal: Negative for abdominal distention, abdominal pain and constipation.  Genitourinary: Negative for difficulty urinating, dysuria and urgency.  Musculoskeletal: Positive for arthralgias and gait problem. Negative for myalgias.       Right hip pain is better  Skin: Negative for color change.  Neurological: Negative for dizziness, speech difficulty, numbness and headaches.       Moves slow.   Psychiatric/Behavioral: Negative for agitation, behavioral problems and sleep disturbance. The patient is not nervous/anxious.     Immunization  History  Administered Date(s) Administered  . Influenza Split 10/20/2012  . Influenza Whole 10/21/2006  . Influenza, High Dose Seasonal PF 10/17/2016  . Influenza-Unspecified 10/20/2013, 10/22/2017  . Moderna SARS-COVID-2 Vaccination 01/22/2019, 02/19/2019  . Pneumococcal Conjugate-13 01/31/2013  . Pneumococcal Polysaccharide-23 01/20/2005, 11/24/2011  . Td 01/21/2004  . Tdap 03/16/2014  . Zoster 04/17/2009   Pertinent  Health Maintenance Due  Topic Date Due  . COLONOSCOPY  06/12/2019  . INFLUENZA VACCINE  Completed  . DEXA SCAN  Completed  . PNA vac Low Risk Adult  Completed   Fall Risk  05/03/2018 11/03/2017 08/04/2017 03/06/2017 11/26/2016  Falls in the past year? 1 No No Yes Yes  Number falls in past yr: 0 - - 2 or more 2 or more  Injury with Fall? 0 - - No No  Risk Factor Category  - - - High Fall Risk High Fall Risk  Risk for fall due to : Other (Comment) - - - -  Follow up Falls evaluation completed;Education provided;Falls prevention discussed - - Falls evaluation completed Falls evaluation completed   Functional Status Survey:    Vitals:   04/13/19 0853  BP: (!) 106/58  Pulse: 82  Resp: 18  Temp: (!) 97.1 F (36.2 C)  SpO2: 94%  Weight: 134 lb 3.2 oz (60.9 kg)  Height: '5\' 8"'  (1.727 m)   Body mass index is 20.41 kg/m. Physical Exam Vitals and nursing note reviewed.  Constitutional:      General: She is not in acute distress.    Appearance: Normal appearance. She is not ill-appearing.  HENT:     Head: Normocephalic and atraumatic.     Mouth/Throat:     Mouth: Mucous membranes are moist.  Eyes:     Extraocular Movements: Extraocular movements intact.     Conjunctiva/sclera: Conjunctivae normal.     Pupils: Pupils are equal, round, and reactive to light.  Cardiovascular:     Rate and Rhythm: Normal rate. Rhythm irregular.     Heart sounds: No murmur.  Pulmonary:     Breath sounds: No rales.  Abdominal:     General: Bowel sounds are normal. There is no  distension.     Palpations: Abdomen is soft.     Tenderness: There is no abdominal tenderness.  Musculoskeletal:     Cervical back: Normal range of motion and neck supple.     Right  lower leg: Edema present.     Left lower leg: Edema present.     Comments: trace edema BLE.   Skin:    General: Skin is warm and dry.  Neurological:     General: No focal deficit present.     Mental Status: She is alert and oriented to person, place, and time. Mental status is at baseline.     Motor: No weakness.     Coordination: Coordination abnormal.     Gait: Gait abnormal.     Comments: Moves slow, more tremor in hands  Psychiatric:        Mood and Affect: Mood normal.        Behavior: Behavior normal.        Thought Content: Thought content normal.     Labs reviewed: Recent Labs    09/12/18 0304 09/12/18 0304 09/13/18 0201 09/13/18 0201 09/14/18 0043 09/21/18 0000 12/09/18 0000 03/24/19 0000 04/01/19 0000  NA 140   < > 140   < > 140   < > 140 141 140  K 3.6   < > 3.8   < > 3.9   < > 3.9 4.4 4.8  CL 108   < > 112*   < > 111   < > 103 107 104  CO2 21*   < > 22   < > 23   < > 29* 28* 31*  GLUCOSE 89  --  95  --  109*  --   --   --   --   BUN 27*   < > 24*   < > 15   < > 24* 26* 33*  CREATININE 0.88   < > 0.75   < > 0.68   < > 0.7 0.7 1.0  CALCIUM 9.2   < > 8.4*   < > 8.5*   < > 9.4 9.5 9.3  MG  --   --  2.0  --   --   --   --   --   --    < > = values in this interval not displayed.   Recent Labs    09/12/18 0304 09/12/18 0304 09/13/18 0201 09/13/18 0201 09/21/18 0000 12/09/18 0000 03/24/19 0000 04/01/19 0000  AST 60*   < > 50*   < > 20 16 11* 12*  ALT 7   < > 7   < > 20 10 5*  --   ALKPHOS 45   < > 36*   < > 42 47 50 48  BILITOT 1.1  --  0.6  --   --   --   --   --   PROT 6.2*  --  4.9*  --  6.4  --   --   --   ALBUMIN 3.7   < > 2.8*   < > 3.9 3.8 4.0 3.8   < > = values in this interval not displayed.   Recent Labs    09/11/18 2105 09/11/18 2105 09/12/18 0304  09/12/18 0304 09/13/18 0201 09/21/18 0000 12/09/18 0000 12/09/18 0000 03/24/19 0000 04/01/19 0000 04/07/19 0000  WBC 10.6*   < > 8.0   < > 8.9   < > 5.5   < > 8.4 8.2 6.6  NEUTROABS 9.4*   < >  --   --  7.1  --  3,718  --  6,602  --   --   HGB 12.6   < > 11.5*   < >  10.5*   < > 11.7*   < > 10.9* 8.9* 9.3*  HCT 40.1   < > 35.2*   < > 32.4*   < > 37   < > 34* 28* 29*  MCV 96.4  --  94.4  --  93.9  --   --   --   --   --   --   PLT 170   < > 150   < > 125*   < > 189   < > 190 170 199   < > = values in this interval not displayed.   Lab Results  Component Value Date   TSH 0.408 09/11/2018   No results found for: HGBA1C Lab Results  Component Value Date   CHOL 185 03/24/2019   HDL 79 (A) 03/24/2019   LDLCALC 94 03/24/2019   LDLDIRECT 104.2 01/25/2013   TRIG 41 03/24/2019   CHOLHDL 2 08/29/2015    Significant Diagnostic Results in last 30 days:  US Guided Needle Placement - No Linked Charges  Result Date: 03/15/2019 Please see Notes tab for imaging impression.   Assessment/Plan Atrial fibrillation (HCC) Heart rate is in control, continue Diltiazem, Xarelto.   Dysphagia Mechanical soft  GERD (gastroesophageal reflux disease) Stable, continue Omeprazole.   Parkinsonism ([place]) Moves slow, w/c for mobility since onset of the right hip pain, continue Sinemet.   DEGENERATIVE JOINT DISEASE, RIGHT HIP Pain in the right hip is better, continue Tylenol, f/u Ortho, activity as tolerated.   Overactive bladder Stable, started Myrbetriq 51m qd 01/28/19 per Urology.   Anemia Stable, Hgb 9.3 04/07/19, continue Fe  Bilateral lower extremity edema Minimal, continue Metolazone 2x/week  Constipation Stable, continue Senokot S, MiraLax.      Family/ staff Communication: plan of care reviewed with the patient and charge nurse.   Labs/tests ordered:  none  Time spend 25 minutes.

## 2019-04-13 NOTE — Assessment & Plan Note (Signed)
Stable, continue Senokot S, MiraLax 

## 2019-04-13 NOTE — Assessment & Plan Note (Signed)
Moves slow, w/c for mobility since onset of the right hip pain, continue Sinemet.

## 2019-04-14 DIAGNOSIS — D649 Anemia, unspecified: Secondary | ICD-10-CM | POA: Diagnosis not present

## 2019-04-14 LAB — CBC: RBC: 3.05 — AB (ref 3.87–5.11)

## 2019-04-14 LAB — CBC AND DIFFERENTIAL
HCT: 30 — AB (ref 36–46)
Hemoglobin: 9.7 — AB (ref 12.0–16.0)
Platelets: 192 (ref 150–399)
WBC: 5.9

## 2019-04-15 DIAGNOSIS — M25551 Pain in right hip: Secondary | ICD-10-CM | POA: Diagnosis not present

## 2019-04-19 ENCOUNTER — Encounter: Payer: Self-pay | Admitting: Internal Medicine

## 2019-04-19 ENCOUNTER — Non-Acute Institutional Stay (SKILLED_NURSING_FACILITY): Payer: Medicare PPO | Admitting: Internal Medicine

## 2019-04-19 DIAGNOSIS — G20C Parkinsonism, unspecified: Secondary | ICD-10-CM

## 2019-04-19 DIAGNOSIS — I4819 Other persistent atrial fibrillation: Secondary | ICD-10-CM

## 2019-04-19 DIAGNOSIS — M25551 Pain in right hip: Secondary | ICD-10-CM | POA: Diagnosis not present

## 2019-04-19 DIAGNOSIS — D649 Anemia, unspecified: Secondary | ICD-10-CM

## 2019-04-19 DIAGNOSIS — R600 Localized edema: Secondary | ICD-10-CM

## 2019-04-19 DIAGNOSIS — G20 Parkinson's disease: Secondary | ICD-10-CM | POA: Diagnosis not present

## 2019-04-19 NOTE — Progress Notes (Signed)
Location:   [facility] Room Number: [place] of Service:  SNF [phone]) Provider:  [name], MD  [name], MD  Patient Care Team: [name], MD as PCP - General (Internal Medicine) [name], [name], MD as PCP - Cardiology (Cardiology) [name], [name], DO as Consulting Physician (Neurology) Mast, Man X, NP as Nurse Practitioner (Internal Medicine)  Extended Emergency Contact Information Primary Emergency Contact: Blasa, Raisch Mobile Phone: [phone] Relation: Daughter Interpreter needed? No Secondary Emergency Contact: [facility] Address: [address]          [place], [place] [address] [name] of [place] Phone: [phone] Mobile Phone: [phone] Relation: Son  Code Status:  DNR Goals of care: Advanced Directive information Advanced Directives 04/19/2019  Does Patient Have a Medical Advance Directive? Yes  Type of Advance Directive [place]  Does patient want to make changes to medical advance directive? No - Patient declined  Copy of [place] in Chart? Yes - validated most recent copy scanned in chart (See row information)  Would patient like information on creating a medical advance directive? -  Pre-existing out of facility DNR order (yellow form or pink MOST form) -     Chief Complaint  Patient presents with   Acute Visit    HPI:  Pt is a 79 y.o. female seen today for an acute visit for Follow up  Patient has a history of Parkinson disease diagnosed 2 years ago on Sinemet and follows withNeurology,paroxysmal A. 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 Erlinda Hong had suggested surgery but patient got another opinion from Dr.  Lyla Glassing.  He has suggested for her to see Dr. Nelva Bush  He also started her on baclofen.  Patient had severe adverse reaction to that.  She became lethargic.  Unable to do her ADLs.  Her baclofen was stopped. Doing well with her Pain but functionally seems to not able to walk or do her transfers. Per Dr. Nelva Bush he wants to continue doing therapy and pain control He wants to keep an option open of trying a lumbar steroid injection if her pain gets unbearable Anemia No More  episodes of blood per rectum.  She has been occult negative twice and her hemoglobin has stabilized on iron  Past Medical History:  Diagnosis Date   Acute lower UTI 09/14/2018   Anemia 10/15/2018   2016 colonoscopy 10/14/18 wbc 5.4, Hgb 10.1, plt 184 12/01/18 wbc 4.5, Hgb 11.1, plt 188, neutrophils 63,  Na 139, K 3.7, Bun 23, creat 0.69, eGFR 83 on Fe, Hgb 11.7 12/09/18    Atrial fibrillation ([place]) 05/21/2010   10/07/18 Na 143, K 4.0, Bun 20, creat 0.79, eGFR 72, wbc 7.1, Hgb 10.9, plt 172 10/28/18 Na 142, K 4.0, Bun 19, creat 0.77, eGFR 74 11/18/18 Na 144, K 4.1, Bun 21, creat 0.69, eGFR 83   Bilateral lower extremity edema 07/19/2018   11/02/18 BMP 2 weeks.    Cervical pain (neck) 06/20/2010   Closed fracture of part of upper end of humerus 05/01/2015   Colles' fracture of right radius 03/05/2015   Constipation 07/19/2018   DEGENERATIVE JOINT DISEASE, RIGHT HIP 02/16/2007   Dupuytren's contracture    Dysuria 09/20/2018   09/20/18 c/o got up  several times last night to go urinate, burning on urination, lower abd /back discomfort, but urinary frequency, leakage are not new. UA C/S, Pyridium 175m tid x 2 days.  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   S/P breast biopsy, left    two o'clock position - benign   Toxic effect of venom(989.5) 07/27/2007   Tremor, unspecified 10/19/2015   Unstable gait 02/16/2017   Vaginal atrophy 10/19/2015   Venous insufficiency    Weakness 09/12/2018   Past Surgical History:  Procedure Laterality Date   BREAST BIOPSY Left    CATARACT EXTRACTION, BILATERAL      Allergies  Allergen Reactions   Doxycycline Nausea And Vomiting   Sulfonamide Derivatives     REACTION: HIVES    Allergies as of 04/19/2019      Reactions   Doxycycline Nausea And Vomiting   Sulfonamide Derivatives    REACTION: HIVES      Medication List       Accurate as of April 19, 2019  2:31 PM. If you have any questions, ask your nurse or doctor.        STOP taking these medications   ferrous sulfate 325 (65 FE) MG tablet Stopped by: AVirgie Dad MD     TAKE these medications   acetaminophen 500 MG tablet Commonly known as: TYLENOL Take 1,000 mg by mouth 3 (three) times daily. Not to exceed 3000 mg in 24 ( scheduled + Prn doses   calcium carbonate 750 MG chewable tablet Commonly known as: TUMS EX Chew 1 tablet by mouth daily. Between the hours of 7am-10am.   carbidopa-levodopa 25-100 MG tablet Commonly known as: SINEMET IR Take 2 tablets by mouth in the morning, at noon, in the evening, and at bedtime. Morning: 7am, MidDay 11am, Afternoon 3pm, Evening 7pm.   carbidopa-levodopa 50-200 MG tablet Commonly known as: SINEMET CR Take 1 tablet by mouth at bedtime. 9pm   Clinpro 5000 1.1 % Pste Generic drug: Sodium Fluoride Place onto teeth at bedtime. Between the hours of 7pm-10pm.   diltiazem 120 MG 24 hr capsule Commonly known as: CARDIZEM CD Take 120 mg by mouth daily. Between the hours of 7am-10am. HOLD IF SBP <  100   hydrocortisone 2.5 % ointment Apply 1 application topically at bedtime. At bedtime for 1 week then change to PRN.   methocarbamol 500 MG tablet Commonly known as: ROBAXIN Take 250 mg by mouth daily. As needed   metolazone 2.5 MG tablet Commonly known as: ZAROXOLYN Take 2.5 mg by mouth. Once A Day on Mon, and Thurs at 7:30am. . Hold if systolic blood pressure less than 100   Myrbetriq 50 MG Tb24 tablet Generic drug: mirabegron ER Take 50 mg by mouth daily. Between the hours of 7am-10am.   omeprazole 20 MG capsule Commonly known as: PRILOSEC Take 20 mg by mouth daily.   oxyCODONE 5 MG immediate release tablet Commonly known as: Oxy IR/ROXICODONE Take 0.5 tablets (2.5 mg total) by mouth every 8 (eight) hours as  needed for up to 14 days for severe pain.   polyethylene glycol 17 g packet Commonly known as: MIRALAX / GLYCOLAX Take 17 g by mouth daily.   rivaroxaban 20 MG Tabs tablet Commonly known as: XARELTO Take 20 mg by mouth every evening. Between the hours of 5pm-6pm.   sennosides-docusate sodium 8.6-50 MG tablet Commonly known as: SENOKOT-S Take 1 tablet by mouth at bedtime. Between the hours of 7pm-10pm.   Vitamin D3 10 MCG (400 UNIT) Caps Take 1 capsule by mouth daily. Between the hours of 7am-10am.   zinc oxide 20 % ointment Apply 1 application topically as needed for irritation. To buttocks after every incontinent episode and as needed for redness. May keep at bedside.       Review of Systems  Review of Systems  Constitutional: Negative for activity change, appetite change, chills, diaphoresis, fatigue and fever.  HENT: Negative for mouth sores, postnasal drip, rhinorrhea, sinus pain and sore throat.   Respiratory: Negative for apnea, cough, chest tightness, shortness of breath and wheezing.   Cardiovascular: Negative for chest pain, palpitations and leg swelling.  Gastrointestinal: Negative for abdominal distention, abdominal pain, constipation,  diarrhea, nausea and vomiting.  Genitourinary: Negative for dysuria and frequency.  Musculoskeletal: Negative for arthralgias, joint swelling and myalgias.  Skin: Negative for rash.  Neurological: Negative for dizziness, syncope, weakness, light-headedness and numbness.  Psychiatric/Behavioral: Negative for behavioral problems, confusion and sleep disturbance.     Immunization History  Administered Date(s) Administered   Influenza Split 10/20/2012   Influenza Whole 10/21/2006   Influenza, High Dose Seasonal PF 10/17/2016   Influenza-Unspecified 10/20/2013, 10/22/2017   Moderna SARS-COVID-2 Vaccination 01/22/2019, 02/19/2019   Pneumococcal Conjugate-13 01/31/2013   Pneumococcal Polysaccharide-23 01/20/2005, 11/24/2011   Td 01/21/2004   Tdap 03/16/2014   Zoster 04/17/2009   Pertinent  Health Maintenance Due  Topic Date Due   COLONOSCOPY  06/12/2019   INFLUENZA VACCINE  Completed   DEXA SCAN  Completed   PNA vac Low Risk Adult  Completed   Fall Risk  05/03/2018 11/03/2017 08/04/2017 03/06/2017 11/26/2016  Falls in the past year? 1 No No Yes Yes  Number falls in past yr: 0 - - 2 or more 2 or more  Injury with Fall? 0 - - No No  Risk Factor Category  - - - High Fall Risk High Fall Risk  Risk for fall due to : Other (Comment) - - - -  Follow up Falls evaluation completed;Education provided;Falls prevention discussed - - Falls evaluation completed Falls evaluation completed   Functional Status Survey:    Vitals:   04/19/19 1422  BP: (!) 112/56  Pulse: 72  Resp: 18  Temp: (!) 96.8 F (36 C)  SpO2: 98%  Weight: 135 lb 6.4 oz (61.4 kg)  Height: '5\' 8"'  (1.727 m)   Body mass index is 20.59 kg/m. Physical Exam  Constitutional: Oriented to person, place, and time. Well-developed and well-nourished.  HENT:  Head: Normocephalic.  Mouth/Throat: Oropharynx is clear and moist.  Eyes: Pupils are equal, round, and reactive to light.  Neck: Neck supple.  Cardiovascular:  Normal rate and normal heart sounds.  No murmur heard. Pulmonary/Chest: Effort normal and breath sounds normal. No respiratory distress. No wheezes. She has no rales.  Abdominal: Soft. Bowel sounds are normal. No distension. There is no tenderness. There is no rebound.  Musculoskeletal: Mild Edema Bilateral Lymphadenopathy: none Neurological: Alert and oriented to person, place, and time.  Skin: Skin is warm and dry.  Psychiatric: Normal mood  and affect. Behavior is normal. Thought content normal.    Labs reviewed: Recent Labs    09/12/18 0304 09/12/18 0304 09/13/18 0201 09/13/18 0201 09/14/18 0043 09/21/18 0000 12/09/18 0000 03/24/19 0000 04/01/19 0000  NA 140   < > 140   < > 140   < > 140 141 140  K 3.6   < > 3.8   < > 3.9   < > 3.9 4.4 4.8  CL 108   < > 112*   < > 111   < > 103 107 104  CO2 21*   < > 22   < > 23   < > 29* 28* 31*  GLUCOSE 89  --  95  --  109*  --   --   --   --   BUN 27*   < > 24*   < > 15   < > 24* 26* 33*  CREATININE 0.88   < > 0.75   < > 0.68   < > 0.7 0.7 1.0  CALCIUM 9.2   < > 8.4*   < > 8.5*   < > 9.4 9.5 9.3  MG  --   --  2.0  --   --   --   --   --   --    < > = values in this interval not displayed.   Recent Labs    09/12/18 0304 09/12/18 0304 09/13/18 0201 09/13/18 0201 09/21/18 0000 12/09/18 0000 03/24/19 0000 04/01/19 0000  AST 60*   < > 50*   < > 20 16 11* 12*  ALT 7   < > 7   < > 20 10 5*  --   ALKPHOS 45   < > 36*   < > 42 47 50 48  BILITOT 1.1  --  0.6  --   --   --   --   --   PROT 6.2*  --  4.9*  --  6.4  --   --   --   ALBUMIN 3.7   < > 2.8*   < > 3.9 3.8 4.0 3.8   < > = values in this interval not displayed.   Recent Labs    09/11/18 2105 09/11/18 2105 09/12/18 0304 09/12/18 0304 09/13/18 0201 09/21/18 0000 12/09/18 0000 12/09/18 0000 03/24/19 0000 03/24/19 0000 04/01/19 0000 04/07/19 0000 04/14/19 0000  WBC 10.6*   < > 8.0   < > 8.9   < > 5.5   < > 8.4   < > 8.2 6.6 5.9  NEUTROABS 9.4*   < >  --   --  7.1  --   3,718  --  6,602  --   --   --   --   HGB 12.6   < > 11.5*   < > 10.5*   < > 11.7*   < > 10.9*   < > 8.9* 9.3* 9.7*  HCT 40.1   < > 35.2*   < > 32.4*   < > 37   < > 34*   < > 28* 29* 30*  MCV 96.4  --  94.4  --  93.9  --   --   --   --   --   --   --   --   PLT 170   < > 150   < > 125*   < > 189   < > 190   < > 170  199 192   < > = values in this interval not displayed.   Lab Results  Component Value Date   TSH 0.408 09/11/2018   No results found for: HGBA1C Lab Results  Component Value Date   CHOL 185 03/24/2019   HDL 79 (A) 03/24/2019   LDLCALC 94 03/24/2019   LDLDIRECT 104.2 01/25/2013   TRIG 41 03/24/2019   CHOLHDL 2 08/29/2015    Significant Diagnostic Results in last 30 days:  No results found.  Assessment/Plan  Pain in right hip Patient has seen  Dr. Erlinda Hong, Dr. Lyla Glassing Dr. Nelva Bush She has decided that she does not want  surgery for her arthritis. At this time the plan is to control the pain and do therapy Patient's pain seems to be controlled on Tylenol Does have option of as needed Robaxin and oxycodone Is working with therapy Discussed this with patient and her daughter  Anemia, unspecified type Has been occult negative twice Last colonoscopy 2016 We will continue her on iron and watch her CBC closely.  If anything changes patient will follow with GI  Persistent atrial fibrillation (HCC) On Cardizem and Xarelto Parkinsonism,  Continue with Sinemet Follow up with Dr Rexene Alberts in April Bilateral lower extremity edema Doing well with Metalazone Urinary Incontinence Symptoms Controlled on Myrbetriq   Family/ staff Communication:   Labs/tests ordered:    Total time spent in this patient care encounter was  45_  minutes; greater than 50% of the visit spent counseling patient and staff, reviewing records , Labs and coordinating care for problems addressed at this encounter.

## 2019-04-26 ENCOUNTER — Ambulatory Visit: Payer: Medicare PPO | Admitting: Orthopaedic Surgery

## 2019-05-02 ENCOUNTER — Non-Acute Institutional Stay (SKILLED_NURSING_FACILITY): Payer: Medicare PPO | Admitting: Nurse Practitioner

## 2019-05-02 ENCOUNTER — Encounter: Payer: Self-pay | Admitting: Nurse Practitioner

## 2019-05-02 DIAGNOSIS — G20 Parkinson's disease: Secondary | ICD-10-CM | POA: Diagnosis not present

## 2019-05-02 DIAGNOSIS — I4819 Other persistent atrial fibrillation: Secondary | ICD-10-CM | POA: Diagnosis not present

## 2019-05-02 DIAGNOSIS — R600 Localized edema: Secondary | ICD-10-CM

## 2019-05-02 DIAGNOSIS — M25551 Pain in right hip: Secondary | ICD-10-CM | POA: Diagnosis not present

## 2019-05-02 DIAGNOSIS — G609 Hereditary and idiopathic neuropathy, unspecified: Secondary | ICD-10-CM

## 2019-05-02 DIAGNOSIS — G629 Polyneuropathy, unspecified: Secondary | ICD-10-CM | POA: Insufficient documentation

## 2019-05-02 NOTE — Assessment & Plan Note (Signed)
Heart rate is in control, continue Diltiazem, Xarelto.

## 2019-05-02 NOTE — Progress Notes (Signed)
Location:   [facility] [place] Room Number: 13 Place of Service:  SNF (31) Provider: Lennie Odor Llewyn Heap NP  [name], MD  Patient Care Team: [name], MD as PCP - General (Internal Medicine) [name], [name], MD as PCP - Cardiology (Cardiology) [name], [name], DO as Consulting Physician (Neurology) Robt Okuda X, NP as Nurse Practitioner (Internal Medicine)  Extended Emergency Contact Information Primary Emergency Contact: Keliyah, Spillman Mobile Phone: [phone] Relation: Daughter Interpreter needed? No Secondary Emergency Contact: [facility] Address: [address]          [place], [place] [address] [name] of [place] Phone: [phone] Mobile Phone: [phone] Relation: Son  Code Status: DNR Goals of care: Advanced Directive information Advanced Directives 04/19/2019  Does Patient Have a Medical Advance Directive? Yes  Type of Advance Directive [place]  Does patient want to make changes to medical advance directive? No - Patient declined  Copy of [place] in Chart? Yes - validated most recent copy scanned in chart (See row information)  Would patient like information on creating a medical advance directive? -  Pre-existing out of facility DNR order (yellow form or pink MOST form) -     Chief Complaint  Patient presents with  . Acute Visit    intermittent burning sensation on right and left great toe.     HPI:  Pt is a 79 y.o. female seen today for an acute visit for reported the patient's intermittent burning sensation on R+L great toes at night when touched sheets, she wears soft slipper in bed for several months,  slightly edema BLE noted.   Hx of edema, chronic Metolazone 2.42m 2x/wk. Afib, heart rate is in control, on Xarelto 238mqd, Diltiazem 12026md. OA pain R hip, on Tylenol 1000m70md. Parkinson's disease, w/c for mobility, moves slow, on Sinemet.   Past Medical History:    Diagnosis Date  . Acute lower UTI 09/14/2018  . Anemia 10/15/2018   2016 colonoscopy 10/14/18 wbc 5.4, Hgb 10.1, plt 184 12/01/18 wbc 4.5, Hgb 11.1, plt 188, neutrophils 63,  Na 139, K 3.7, Bun 23, creat 0.69, eGFR 83 on Fe, Hgb 11.7 12/09/18   . Atrial fibrillation (HCC)[place]1/2012   10/07/18 Na 143, K 4.0, Bun 20, creat 0.79, eGFR 72, wbc 7.1, Hgb 10.9, plt 172 10/28/18 Na 142, K 4.0, Bun 19, creat 0.77, eGFR 74 11/18/18 Na 144, K 4.1, Bun 21, creat 0.69, eGFR 83  . Bilateral lower extremity edema 07/19/2018   11/02/18 BMP 2 weeks.   . Cervical pain (neck) 06/20/2010  . Closed fracture of part of upper end of humerus 05/01/2015  . Colles' fracture of right radius 03/05/2015  . Constipation 07/19/2018  . DEGENERATIVE JOINT DISEASE, RIGHT HIP 02/16/2007  . Dupuytren's contracture   . Dysuria 09/20/2018   09/20/18 c/o got up several times last night to go urinate, burning on urination, lower abd /back discomfort, but urinary frequency, leakage are not new. UA C/S, Pyridium 100mg77m x 2 days.  09/21/18 wbc 6.1, Hgb 11.8, plt 210, neutrophil 69.4, Na 142, K 4.1, Bun 19, creat 0.78, TP 6.4, albumin 3.9  . Hematuria 02/04/2014  . Hypotension 09/18/2018  . Long term (current) use of anticoagulants 05/29/2016  . Osteoarthritis of hip    right  . Osteoarthritis of left knee   . Overactive bladder 10/06/2018  . Parkinson disease (HCC) [place] Paroxysmal A-fib (HCC) [place] POSTMENOPAUSAL SYNDROME 02/16/2007  .  PREMATURE ATRIAL CONTRACTIONS 02/16/2007  . 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 S/P breast biopsy, left    two o'clock position - benign  . 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 Tremor, unspecified 10/19/2015  . Unstable gait 02/16/2017  . Vaginal atrophy 10/19/2015  . Venous insufficiency   . Weakness 09/12/2018   Past Surgical History:  Procedure Laterality Date  . BREAST BIOPSY Left   . CATARACT EXTRACTION, BILATERAL      Allergies  Allergen Reactions  . Doxycycline Nausea And  Vomiting  . Sulfonamide Derivatives     REACTION: HIVES    Allergies as of 05/02/2019      Reactions   Doxycycline Nausea And Vomiting   Sulfonamide Derivatives    REACTION: HIVES      Medication List       Accurate as of May 02, 2019 11:59 PM. If you have any questions, ask your nurse or doctor.        STOP taking these medications   ferrous sulfate 325 (65 FE) MG EC tablet Stopped by: Elleen Coulibaly X Landy Dunnavant, NP     TAKE these medications   acetaminophen 500 MG tablet Commonly known as: TYLENOL Take 1,000 mg by mouth 3 (three) times daily. Not to exceed 3000 mg in 24 ( scheduled + Prn doses   calcium carbonate 750 MG chewable tablet Commonly known as: TUMS EX Chew 1 tablet by mouth daily. Between the hours of 7am-10am.   carbidopa-levodopa 25-100 MG tablet Commonly known as: SINEMET IR Take 2 tablets by mouth in the morning, at noon, in the evening, and at bedtime. Morning: 7am, MidDay 11am, Afternoon 3pm, Evening 7pm.   carbidopa-levodopa 50-200 MG tablet Commonly known as: SINEMET CR Take 1 tablet by mouth at bedtime. 9pm   Clinpro 5000 1.1 % Pste Generic drug: Sodium Fluoride Place onto teeth at bedtime. Between the hours of 7pm-10pm.   diltiazem 120 MG 24 hr capsule Commonly known as: CARDIZEM CD Take 120 mg by mouth daily. Between the hours of 7am-10am. HOLD IF SBP < 100   hydrocortisone 2.5 % ointment Apply 1 application topically at bedtime. At bedtime for 1 week then change to PRN.   methocarbamol 500 MG tablet Commonly known as: ROBAXIN Take 250 mg by mouth daily. As needed   metolazone 2.5 MG tablet Commonly known as: ZAROXOLYN Take 2.5 mg by mouth. Once A Day on Mon, and Thurs at 7:30am. . Hold if systolic blood pressure less than 100   Myrbetriq 50 MG Tb24 tablet Generic drug: mirabegron ER Take 50 mg by mouth daily. Between the hours of 7am-10am.   omeprazole 20 MG capsule Commonly known as: PRILOSEC Take 20 mg by mouth daily.   polyethylene  glycol 17 g packet Commonly known as: MIRALAX / GLYCOLAX Take 17 g by mouth daily.   rivaroxaban 20 MG Tabs tablet Commonly known as: XARELTO Take 20 mg by mouth every evening. Between the hours of 5pm-6pm.   sennosides-docusate sodium 8.6-50 MG tablet Commonly known as: SENOKOT-S Take 1 tablet by mouth at bedtime. Between the hours of 7pm-10pm.   Vitamin D3 10 MCG (400 UNIT) Caps Take 1 capsule by mouth daily. Between the hours of 7am-10am.   zinc oxide 20 % ointment Apply 1 application topically as needed for irritation. To buttocks after every incontinent episode and as needed for redness. May keep at bedside.       Review of Systems  Constitutional: Negative for activity change, appetite change, fatigue and  fever.  HENT: Positive for hearing loss and trouble swallowing. Negative for congestion and voice change.   Eyes: Negative for visual disturbance.  Respiratory: Negative for cough and shortness of breath.   Cardiovascular: Positive for leg swelling. Negative for chest pain and palpitations.  Gastrointestinal: Negative for abdominal distention, abdominal pain and constipation.  Genitourinary: Negative for difficulty urinating, dysuria and urgency.  Musculoskeletal: Positive for arthralgias and gait problem. Negative for myalgias.       Right hip pain is resolved.   Skin: Negative for color change.  Neurological: Negative for dizziness, speech difficulty, numbness and headaches.       Moves slow. R+L great toe burning sensation at night when toes are touched by sheets, comes/goes  Psychiatric/Behavioral: Negative for agitation, behavioral problems and sleep disturbance.    Immunization History  Administered Date(s) Administered  . Influenza Split 10/20/2012  . Influenza Whole 10/21/2006  . Influenza, High Dose Seasonal PF 10/17/2016  . Influenza-Unspecified 10/20/2013, 10/22/2017  . Moderna SARS-COVID-2 Vaccination 01/22/2019, 02/19/2019  . Pneumococcal Conjugate-13  01/31/2013  . Pneumococcal Polysaccharide-23 01/20/2005, 11/24/2011  . Td 01/21/2004  . Tdap 03/16/2014  . Zoster 04/17/2009   Pertinent  Health Maintenance Due  Topic Date Due  . COLONOSCOPY  06/12/2019  . INFLUENZA VACCINE  08/21/2019  . DEXA SCAN  Completed  . PNA vac Low Risk Adult  Completed   Fall Risk  05/03/2018 11/03/2017 08/04/2017 03/06/2017 11/26/2016  Falls in the past year? 1 No No Yes Yes  Number falls in past yr: 0 - - 2 or more 2 or more  Injury with Fall? 0 - - No No  Risk Factor Category  - - - High Fall Risk High Fall Risk  Risk for fall due to : Other (Comment) - - - -  Follow up Falls evaluation completed;Education provided;Falls prevention discussed - - Falls evaluation completed Falls evaluation completed   Functional Status Survey:    Vitals:   05/02/19 1328  BP: 118/70  Pulse: 70  Resp: 18  Temp: (!) 97.4 F (36.3 C)  SpO2: 96%  Weight: 138 lb 12.8 oz (63 kg)  Height: '5\' 8"'  (1.727 m)   Body mass index is 21.1 kg/m. Physical Exam Vitals and nursing note reviewed.  Constitutional:      Appearance: Normal appearance.  HENT:     Head: Normocephalic and atraumatic.     Mouth/Throat:     Mouth: Mucous membranes are moist.  Eyes:     Extraocular Movements: Extraocular movements intact.     Conjunctiva/sclera: Conjunctivae normal.     Pupils: Pupils are equal, round, and reactive to light.  Cardiovascular:     Rate and Rhythm: Normal rate. Rhythm irregular.     Heart sounds: No murmur.     Comments: DP pulses present L>R Pulmonary:     Breath sounds: No rales.  Abdominal:     General: There is no distension.     Palpations: Abdomen is soft.     Tenderness: There is no abdominal tenderness.  Musculoskeletal:     Cervical back: Normal range of motion and neck supple.     Right lower leg: Edema present.     Left lower leg: Edema present.     Comments: trace edema BLE.   Skin:    General: Skin is warm and dry.  Neurological:     General: No  focal deficit present.     Mental Status: She is alert and oriented to person, place, and time. Mental  status is at baseline.     Motor: No weakness.     Coordination: Coordination abnormal.     Gait: Gait abnormal.     Comments: Moves slow, tremor in hands  Psychiatric:        Mood and Affect: Mood normal.        Behavior: Behavior normal.        Thought Content: Thought content normal.     Labs reviewed: Recent Labs    09/12/18 0304 09/12/18 0304 09/13/18 0201 09/13/18 0201 09/14/18 0043 09/21/18 0000 12/09/18 0000 03/24/19 0000 04/01/19 0000  NA 140   < > 140   < > 140   < > 140 141 140  K 3.6   < > 3.8   < > 3.9   < > 3.9 4.4 4.8  CL 108   < > 112*   < > 111   < > 103 107 104  CO2 21*   < > 22   < > 23   < > 29* 28* 31*  GLUCOSE 89  --  95  --  109*  --   --   --   --   BUN 27*   < > 24*   < > 15   < > 24* 26* 33*  CREATININE 0.88   < > 0.75   < > 0.68   < > 0.7 0.7 1.0  CALCIUM 9.2   < > 8.4*   < > 8.5*   < > 9.4 9.5 9.3  MG  --   --  2.0  --   --   --   --   --   --    < > = values in this interval not displayed.   Recent Labs    09/12/18 0304 09/12/18 0304 09/13/18 0201 09/13/18 0201 09/21/18 0000 12/09/18 0000 03/24/19 0000 04/01/19 0000  AST 60*   < > 50*   < > 20 16 11* 12*  ALT 7   < > 7   < > 20 10 5*  --   ALKPHOS 45   < > 36*   < > 42 47 50 48  BILITOT 1.1  --  0.6  --   --   --   --   --   PROT 6.2*  --  4.9*  --  6.4  --   --   --   ALBUMIN 3.7   < > 2.8*   < > 3.9 3.8 4.0 3.8   < > = values in this interval not displayed.   Recent Labs    09/11/18 2105 09/11/18 2105 09/12/18 0304 09/12/18 0304 09/13/18 0201 09/21/18 0000 12/09/18 0000 12/09/18 0000 03/24/19 0000 03/24/19 0000 04/01/19 0000 04/07/19 0000 04/14/19 0000  WBC 10.6*   < > 8.0   < > 8.9   < > 5.5   < > 8.4   < > 8.2 6.6 5.9  NEUTROABS 9.4*   < >  --   --  7.1  --  3,718  --  6,602  --   --   --   --   HGB 12.6   < > 11.5*   < > 10.5*   < > 11.7*   < > 10.9*   < > 8.9*  9.3* 9.7*  HCT 40.1   < > 35.2*   < > 32.4*   < > 37   < > 34*   < > 28* 29*  30*  MCV 96.4  --  94.4  --  93.9  --   --   --   --   --   --   --   --   PLT 170   < > 150   < > 125*   < > 189   < > 190   < > 170 199 192   < > = values in this interval not displayed.   Lab Results  Component Value Date   TSH 0.408 09/11/2018   No results found for: HGBA1C Lab Results  Component Value Date   CHOL 185 03/24/2019   HDL 79 (A) 03/24/2019   LDLCALC 94 03/24/2019   LDLDIRECT 104.2 01/25/2013   TRIG 41 03/24/2019   CHOLHDL 2 08/29/2015    Significant Diagnostic Results in last 30 days:  No results found.  Assessment/Plan: Peripheral neuropathy  intermittent burning sensation on R+L great toes at night when touched sheets, she wears soft slipper in bed for several months. DP pulses present L>R. The patient stated Tylenol is adequate. Observe.   Parkinsonism ([place]) Moves slow, w/c for mobility, continue Sinemet.   Pain in right hip Improved, continue w/c for mobility, Tylenol for pain.   Atrial fibrillation (HCC) Heart rate is in control, continue Diltiazem, Xarelto.   Bilateral lower extremity edema Chronic, continue Metolazone 2.53m 2x/wk    Family/ staff Communication: plan of care reviewed with the patient and charge nurse.   Labs/tests ordered:  None  Time spend 25 minutes.

## 2019-05-02 NOTE — Assessment & Plan Note (Signed)
Chronic, continue Metolazone 2.5mg  2x/wk

## 2019-05-02 NOTE — Assessment & Plan Note (Signed)
Moves slow, w/c for mobility, continue Sinemet.

## 2019-05-02 NOTE — Assessment & Plan Note (Signed)
Improved, continue w/c for mobility, Tylenol for pain.

## 2019-05-02 NOTE — Assessment & Plan Note (Addendum)
intermittent burning sensation on R+L great toes at night when touched sheets, she wears soft slipper in bed for several months. DP pulses present L>R. The patient stated Tylenol is adequate. Observe.

## 2019-05-03 ENCOUNTER — Encounter: Payer: Self-pay | Admitting: Nurse Practitioner

## 2019-05-08 ENCOUNTER — Encounter: Payer: Self-pay | Admitting: Internal Medicine

## 2019-05-09 ENCOUNTER — Encounter: Payer: Self-pay | Admitting: Nurse Practitioner

## 2019-05-09 ENCOUNTER — Non-Acute Institutional Stay (SKILLED_NURSING_FACILITY): Payer: Medicare PPO | Admitting: Nurse Practitioner

## 2019-05-09 DIAGNOSIS — R600 Localized edema: Secondary | ICD-10-CM | POA: Diagnosis not present

## 2019-05-09 DIAGNOSIS — K219 Gastro-esophageal reflux disease without esophagitis: Secondary | ICD-10-CM

## 2019-05-09 DIAGNOSIS — I4819 Other persistent atrial fibrillation: Secondary | ICD-10-CM

## 2019-05-09 DIAGNOSIS — G609 Hereditary and idiopathic neuropathy, unspecified: Secondary | ICD-10-CM | POA: Diagnosis not present

## 2019-05-09 DIAGNOSIS — D649 Anemia, unspecified: Secondary | ICD-10-CM

## 2019-05-09 DIAGNOSIS — M25551 Pain in right hip: Secondary | ICD-10-CM | POA: Diagnosis not present

## 2019-05-09 NOTE — Assessment & Plan Note (Signed)
No pain, the patient desires to change Tylenol to prn.

## 2019-05-09 NOTE — Assessment & Plan Note (Signed)
Worsened, but no s/s of infection, wound, flare up gout, chronic venous insufficiency appearance, weak DP pulses R+L, the patient agreed to increase Metolazone slightly, weight gained about # 3 Ibs, but denied SOB, cough, orthopnea, phlegm production, or O2 desaturation, increase Metolazone 2.5mg  3x/wk from 2x/wk, update BMP one week.

## 2019-05-09 NOTE — Assessment & Plan Note (Signed)
Heart rate is in control, continue Diltiazem, Xarelto.

## 2019-05-09 NOTE — Assessment & Plan Note (Signed)
Chronic, no pain presently

## 2019-05-09 NOTE — Assessment & Plan Note (Signed)
Stable, continue Omeprazole.  

## 2019-05-09 NOTE — Assessment & Plan Note (Signed)
Hgb 9.7 04/10/19, update CBC one week.

## 2019-05-09 NOTE — Progress Notes (Signed)
Location:   [facility] Room Number: 13 Place of Service:  SNF (31) Provider:  Kelli Egolf, NP   Patient Care Team: [name], MD as PCP - General (Internal Medicine) [name], [name], MD as PCP - Cardiology (Cardiology) [name], [name], DO as Consulting Physician (Neurology) Ashlin Hidalgo X, NP as Nurse Practitioner (Internal Medicine)  Extended Emergency Contact Information Primary Emergency Contact: Craig, [place] Mobile Phone: [phone] Relation: Daughter Interpreter needed? No Secondary Emergency Contact: [facility] Address: [address]          [place], [place] [address] [name] of [place] Phone: [phone] Mobile Phone: [phone] Relation: Son  Code Status:  DNR Goals of care: Advanced Directive information Advanced Directives 05/09/2019  Does Patient Have a Medical Advance Directive? Yes  Type of Advance Directive Out of facility DNR (pink MOST or yellow form);Healthcare Power of Attorney  Does patient want to make changes to medical advance directive? No - Patient declined  Copy of [place] in Chart? Yes - validated most recent copy scanned in chart (See row information)  Would patient like information on creating a medical advance directive? -  Pre-existing out of facility DNR order (yellow form or pink MOST form) -     Chief Complaint  Patient presents with  . Acute Visit    Swelling Legs    HPI:  Pt is a 79 y.o. female seen today for an acute visit for worsened swelling in BLE,  but no s/s of infection, wound, flare up gout, chronic venous insufficiency appearance present, weak DP pulses R+L noted, reported  weight gained about # 3 Ibs, but denied SOB, cough, orthopnea, phlegm production, or O2 desaturation. No pain in calf or with dorsiflexion of R+L foot. On Metolazone 2.15m 2x/wk. Afib, heart rate is in control, on Xarelto 240mqd, Diltiazem 1208md. R hip pain, resolved, on Tylenol 1000m54md.  Anemia, stable, Hgb 9-10. GERD, stable, on Omeprazole 20mg25m    Past Medical History:  Diagnosis Date  . Acute lower UTI 09/14/2018  . Anemia 10/15/2018   2016 colonoscopy 10/14/18 wbc 5.4, Hgb 10.1, plt 184 12/01/18 wbc 4.5, Hgb 11.1, plt 188, neutrophils 63,  Na 139, K 3.7, Bun 23, creat 0.69, eGFR 83 on Fe, Hgb 11.7 12/09/18   . Atrial fibrillation (HCC) [place]/2012   10/07/18 Na 143, K 4.0, Bun 20, creat 0.79, eGFR 72, wbc 7.1, Hgb 10.9, plt 172 10/28/18 Na 142, K 4.0, Bun 19, creat 0.77, eGFR 74 11/18/18 Na 144, K 4.1, Bun 21, creat 0.69, eGFR 83  . Bilateral lower extremity edema 07/19/2018   11/02/18 BMP 2 weeks.   . Cervical pain (neck) 06/20/2010  . Closed fracture of part of upper end of humerus 05/01/2015  . Colles' fracture of right radius 03/05/2015  . Constipation 07/19/2018  . DEGENERATIVE JOINT DISEASE, RIGHT HIP 02/16/2007  . Dupuytren's contracture   . Dysuria 09/20/2018   09/20/18 c/o got up several times last night to go urinate, burning on urination, lower abd /back discomfort, but urinary frequency, leakage are not new. UA C/S, Pyridium 100mg 44mx 2 days.  09/21/18 wbc 6.1, Hgb 11.8, plt 210, neutrophil 69.4, Na 142, K 4.1, Bun 19, creat 0.78, TP 6.4, albumin 3.9  . Hematuria 02/04/2014  . Hypotension 09/18/2018  . Long term (current) use of anticoagulants 05/29/2016  . Osteoarthritis of hip    right  . Osteoarthritis of left knee   . Overactive bladder 10/06/2018  .  Parkinson disease ([place])   . Paroxysmal A-fib ([place])   . POSTMENOPAUSAL SYNDROME 02/16/2007  . PREMATURE ATRIAL CONTRACTIONS 02/16/2007  . 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 S/P breast biopsy, left    two o'clock position - benign  . 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 Tremor, unspecified 10/19/2015  . Unstable gait 02/16/2017  . Vaginal atrophy 10/19/2015  . Venous insufficiency   . Weakness 09/12/2018   Past Surgical History:  Procedure Laterality Date  . BREAST BIOPSY Left   . CATARACT  EXTRACTION, BILATERAL      Allergies  Allergen Reactions  . Doxycycline Nausea And Vomiting  . Sulfonamide Derivatives     REACTION: HIVES    Allergies as of 05/09/2019      Reactions   Doxycycline Nausea And Vomiting   Sulfonamide Derivatives    REACTION: HIVES      Medication List       Accurate as of May 09, 2019  1:47 PM. If you have any questions, ask your nurse or doctor.        acetaminophen 500 MG tablet Commonly known as: TYLENOL Take 1,000 mg by mouth 3 (three) times daily. Not to exceed 3000 mg in 24 ( scheduled + Prn doses   calcium carbonate 750 MG chewable tablet Commonly known as: TUMS EX Chew 1 tablet by mouth daily. Between the hours of 7am-10am.   carbidopa-levodopa 25-100 MG tablet Commonly known as: SINEMET IR Take 2 tablets by mouth in the morning, at noon, in the evening, and at bedtime. Morning: 7am, MidDay 11am, Afternoon 3pm, Evening 7pm.   carbidopa-levodopa 50-200 MG tablet Commonly known as: SINEMET CR Take 1 tablet by mouth at bedtime. 9pm   Clinpro 5000 1.1 % Pste Generic drug: Sodium Fluoride Place onto teeth at bedtime. Between the hours of 7pm-10pm.   diltiazem 120 MG 24 hr capsule Commonly known as: CARDIZEM CD Take 120 mg by mouth daily. Between the hours of 7am-10am. HOLD IF SBP < 100   hydrocortisone 2.5 % ointment Apply 1 application topically at bedtime. At bedtime for 1 week then change to PRN.   methocarbamol 500 MG tablet Commonly known as: ROBAXIN Take 250 mg by mouth daily. As needed   metolazone 2.5 MG tablet Commonly known as: ZAROXOLYN Take 2.5 mg by mouth. Once A Day on Mon, and Thurs at 7:30am. . Hold if systolic blood pressure less than 100   Myrbetriq 50 MG Tb24 tablet Generic drug: mirabegron ER Take 50 mg by mouth daily. Between the hours of 7am-10am.   omeprazole 20 MG capsule Commonly known as: PRILOSEC Take 20 mg by mouth daily.   polyethylene glycol 17 g packet Commonly known as: MIRALAX /  GLYCOLAX Take 17 g by mouth daily.   rivaroxaban 20 MG Tabs tablet Commonly known as: XARELTO Take 20 mg by mouth every evening. Between the hours of 5pm-6pm.   sennosides-docusate sodium 8.6-50 MG tablet Commonly known as: SENOKOT-S Take 1 tablet by mouth at bedtime. Between the hours of 7pm-10pm.   Vitamin D3 10 MCG (400 UNIT) Caps Take 1 capsule by mouth daily. Between the hours of 7am-10am.   zinc oxide 20 % ointment Apply 1 application topically as needed for irritation. To buttocks after every incontinent episode and as needed for redness. May keep at bedside.       Review of Systems  Constitutional: Negative for activity change, appetite change, fatigue and fever.  HENT: Positive for hearing loss and trouble swallowing. Negative  for congestion and voice change.   Eyes: Negative for visual disturbance.  Respiratory: Negative for cough and shortness of breath.   Cardiovascular: Positive for leg swelling. Negative for chest pain and palpitations.  Gastrointestinal: Negative for abdominal distention, abdominal pain and constipation.  Genitourinary: Negative for difficulty urinating, dysuria and urgency.  Musculoskeletal: Positive for arthralgias and gait problem. Negative for myalgias.       Right hip pain is resolved.   Skin: Negative for color change.  Neurological: Negative for dizziness, speech difficulty, numbness and headaches.       Moves slow. R+L great toe burning sensation at night when toes are touched by sheets, comes/goes  Psychiatric/Behavioral: Negative for agitation, behavioral problems and sleep disturbance.    Immunization History  Administered Date(s) Administered  . Influenza Split 10/20/2012  . Influenza Whole 10/21/2006  . Influenza, High Dose Seasonal PF 10/17/2016  . Influenza-Unspecified 10/20/2013, 10/22/2017  . Moderna SARS-COVID-2 Vaccination 01/22/2019, 02/19/2019  . Pneumococcal Conjugate-13 01/31/2013  . Pneumococcal Polysaccharide-23  01/20/2005, 11/24/2011  . Td 01/21/2004  . Tdap 03/16/2014  . Zoster 04/17/2009   Pertinent  Health Maintenance Due  Topic Date Due  . COLONOSCOPY  06/12/2019  . INFLUENZA VACCINE  08/21/2019  . DEXA SCAN  Completed  . PNA vac Low Risk Adult  Completed   Fall Risk  05/03/2018 11/03/2017 08/04/2017 03/06/2017 11/26/2016  Falls in the past year? 1 No No Yes Yes  Number falls in past yr: 0 - - 2 or more 2 or more  Injury with Fall? 0 - - No No  Risk Factor Category  - - - High Fall Risk High Fall Risk  Risk for fall due to : Other (Comment) - - - -  Follow up Falls evaluation completed;Education provided;Falls prevention discussed - - Falls evaluation completed Falls evaluation completed   Functional Status Survey:    Vitals:   05/09/19 1311  BP: 122/68  Pulse: 79  Resp: 18  Temp: (!) 97.4 F (36.3 C)  SpO2: 97%  Weight: 140 lb 6.4 oz (63.7 kg)  Height: _0  (1.727 m)   Body mass index is 21.35 kg/m. Physical Exam Vitals and nursing note reviewed.  Constitutional:      Appearance: Normal appearance.  HENT:     Head: Normocephalic and atraumatic.     Nose: Nose normal.     Mouth/Throat:     Mouth: Mucous membranes are moist.  Eyes:     Extraocular Movements: Extraocular movements intact.     Conjunctiva/sclera: Conjunctivae normal.     Pupils: Pupils are equal, round, and reactive to light.  Cardiovascular:     Rate and Rhythm: Normal rate. Rhythm irregular.     Heart sounds: No murmur.     Comments: DP pulses present L>R, weak Pulmonary:     Breath sounds: No rales.  Abdominal:     General: There is no distension.     Palpations: Abdomen is soft.     Tenderness: There is no abdominal tenderness.  Musculoskeletal:     Cervical back: Normal range of motion and neck supple.     Right lower leg: Edema present.     Left lower leg: Edema present.     Comments: 1+ edema BLE. No pain in calf palpated or with dorsiflexion R+L.   Skin:    General: Skin is warm and  dry.     Findings: No bruising or erythema.     Comments: Chronic venous insufficiency skin changes, slightly dependent rubor  in toes. No open wound, warmth, or tenderness BLE/feet.   Neurological:     General: No focal deficit present.     Mental Status: She is alert and oriented to person, place, and time. Mental status is at baseline.     Motor: No weakness.     Coordination: Coordination abnormal.     Gait: Gait abnormal.     Comments: Moves slow, tremor in hands  Psychiatric:        Mood and Affect: Mood normal.        Behavior: Behavior normal.        Thought Content: Thought content normal.     Labs reviewed: Recent Labs    09/12/18 0304 09/12/18 0304 09/13/18 0201 09/13/18 0201 09/14/18 0043 09/21/18 0000 12/09/18 0000 03/24/19 0000 04/01/19 0000  NA 140   < > 140   < > 140   < > 140 141 140  K 3.6   < > 3.8   < > 3.9   < > 3.9 4.4 4.8  CL 108   < > 112*   < > 111   < > 103 107 104  CO2 21*   < > 22   < > 23   < > 29* 28* 31*  GLUCOSE 89  --  95  --  109*  --   --   --   --   BUN 27*   < > 24*   < > 15   < > 24* 26* 33*  CREATININE 0.88   < > 0.75   < > 0.68   < > 0.7 0.7 1.0  CALCIUM 9.2   < > 8.4*   < > 8.5*   < > 9.4 9.5 9.3  MG  --   --  2.0  --   --   --   --   --   --    < > = values in this interval not displayed.   Recent Labs    09/12/18 0304 09/12/18 0304 09/13/18 0201 09/13/18 0201 09/21/18 0000 12/09/18 0000 03/24/19 0000 04/01/19 0000  AST 60*   < > 50*   < > 20 16 11* 12*  ALT 7   < > 7   < > 20 10 5*  --   ALKPHOS 45   < > 36*   < > 42 47 50 48  BILITOT 1.1  --  0.6  --   --   --   --   --   PROT 6.2*  --  4.9*  --  6.4  --   --   --   ALBUMIN 3.7   < > 2.8*   < > 3.9 3.8 4.0 3.8   < > = values in this interval not displayed.   Recent Labs    09/11/18 2105 09/11/18 2105 09/12/18 0304 09/12/18 0304 09/13/18 0201 09/21/18 0000 12/09/18 0000 12/09/18 0000 03/24/19 0000 03/24/19 0000 04/01/19 0000 04/07/19 0000 04/14/19 0000    WBC 10.6*   < > 8.0   < > 8.9   < > 5.5   < > 8.4   < > 8.2 6.6 5.9  NEUTROABS 9.4*   < >  --   --  7.1  --  3,718  --  6,602  --   --   --   --   HGB 12.6   < > 11.5*   < > 10.5*   < > 11.7*   < >  10.9*   < > 8.9* 9.3* 9.7*  HCT 40.1   < > 35.2*   < > 32.4*   < > 37   < > 34*   < > 28* 29* 30*  MCV 96.4  --  94.4  --  93.9  --   --   --   --   --   --   --   --   PLT 170   < > 150   < > 125*   < > 189   < > 190   < > 170 199 192   < > = values in this interval not displayed.   Lab Results  Component Value Date   TSH 0.408 09/11/2018   No results found for: HGBA1C Lab Results  Component Value Date   CHOL 185 03/24/2019   HDL 79 (A) 03/24/2019   LDLCALC 94 03/24/2019   LDLDIRECT 104.2 01/25/2013   TRIG 41 03/24/2019   CHOLHDL 2 08/29/2015    Significant Diagnostic Results in last 30 days:  No results found.  Assessment/Plan Bilateral lower extremity edema Worsened, but no s/s of infection, wound, flare up gout, chronic venous insufficiency appearance, weak DP pulses R+L, the patient agreed to increase Metolazone slightly, weight gained about # 3 Ibs, but denied SOB, cough, orthopnea, phlegm production, or O2 desaturation, increase Metolazone 2.52m 3x/wk from 2x/wk, update BMP one week.   Anemia Hgb 9.7 04/10/19, update CBC one week.   Right hip pain No pain, the patient desires to change Tylenol to prn.   GERD (gastroesophageal reflux disease) Stable, continue Omeprazole.   Atrial fibrillation (HCC) Heart rate is in control, continue Diltiazem, Xarelto.   Peripheral neuropathy Chronic, no pain presently     Family/ staff Communication: plan of care reviewed with the patient and charge nurse.   Labs/tests ordered:  CBC/diff, BMP/eGFR one week.   Time spend 25 minutes.

## 2019-05-16 ENCOUNTER — Encounter: Payer: Self-pay | Admitting: Family Medicine

## 2019-05-16 ENCOUNTER — Ambulatory Visit: Payer: Medicare Other | Admitting: Family Medicine

## 2019-05-16 ENCOUNTER — Other Ambulatory Visit: Payer: Self-pay

## 2019-05-16 VITALS — BP 97/56 | HR 70

## 2019-05-16 DIAGNOSIS — M25511 Pain in right shoulder: Secondary | ICD-10-CM | POA: Diagnosis not present

## 2019-05-16 DIAGNOSIS — R5383 Other fatigue: Secondary | ICD-10-CM | POA: Diagnosis not present

## 2019-05-16 DIAGNOSIS — G20 Parkinson's disease: Secondary | ICD-10-CM | POA: Diagnosis not present

## 2019-05-16 DIAGNOSIS — G8929 Other chronic pain: Secondary | ICD-10-CM

## 2019-05-16 DIAGNOSIS — I959 Hypotension, unspecified: Secondary | ICD-10-CM | POA: Diagnosis not present

## 2019-05-16 NOTE — Progress Notes (Addendum)
PATIENT: Stephanie Shaw DOB: 1940-07-17  REASON FOR VISIT: follow up HISTORY FROM: patient  Chief Complaint  Patient presents with  . Follow-up    RM 1, with daughter, Baldo Ash. PD follow up. [facility].     HISTORY OF PRESENT ILLNESS: Today 05/18/19 Stephanie Shaw is a 79 y.o. female here today for follow up for PD. She was previously being seen by Dr [name], however, daughter requested she be seen by [place] of[facility] second opinion. She was seen by Dr Rexene Alberts 01/12/2019. Treatment regimen was continued. She is taking Sinemet IR 25-114m two tablets QID and Sinemet CR 50-2068mat bedtime. She does feel that CR dosing has helped significantly. She feels that her right leg is weaker. She is being followed by orthopedics for chronic hip pain. She was being considered for a hip replacement, however after having a visit for second opinion, it was decided not to pursue surgery. She has more stiffness in neck and shoulders. She feels that stiffness does improve with about an hour after taking Sinemet and returns in 2-3 hours following. She denies falls. [facility] difficulty with swallowing but does feel that it is hard to chew foods. Baseline BP 90's/60's. She does have a history of hypotension. She denies dizziness or lightheadedness.   HISTORY: (copied from Dr AtGuadelupe Sabinote on 01/12/2019)  Dear Dr. GuLyndel Safe  I saw your patient, Stephanie Gauerupon your kind request in the neurologic clinic today for initial consultation of her parkinsonism.  The patient is accompanied by a caretaker from her SNF today.  As you know, Stephanie Shaw a [age]ear old right-handed woman with an underlying medical history of paroxysmal A. fib, osteoarthritis, chronic venous insufficiency and Dupuytren's contracture, who was diagnosed with Parkinson's disease in 2018.  She believes she has had symptoms for several years, does recall that she had left-sided symptoms in the beginning.  Over  time, she has had more stiffness, decline in mobility, constipation, low blood pressure issues, and has fallen.  [facility].  She fell in the summer and feels that she had a more steeper decline since the summer. She [facility], had a visit on 09/[facility]/2018.  She was on Sinemet 1.5 pills 4 times a day, 25-100 mg strength.  She was hospitalized after several falls in August 2020.  She was diagnosed with rhabdomyolysis.  She went to rehab after that.  She had an abnormal DaTscan on 09/25/2016 and I reviewed the results: IMPRESSION: Poor uptake of dopamine transport specific radiotracer within the LEFT and RIGHT putamen and decrease relative uptake in the head of the RIGHT caudate nucleus is in pattern typical of Parkinson's syndrome.  She also had a telemedicine visit with Dr. TaCarles Colletn 12/23/2018 which I reviewed: Her Sinemet was changed to 2 pills 4 times a day and she was advised to start Sinemet CR 50-200 mg strength at bedtime.  She was advised to follow-up with her primary care physician regarding anxiety and depression symptoms and to follow-up with urology regarding her urinary incontinence.   She reports that she has an appointment with urology.  She reports constipation and indicates that she has a bowel movement typically every other day, sometimes every third day.  In reviewing her MAR, I do not see that she has any as needed medication for stool softener or laxative.  She denies any hallucinations,  she does have difficulty maintaining sleep.  She indicates nocturia about once or twice per average night which is better since the summer.  She also was treated for a UTI in the summer.  She reports no significant difficulty falling asleep but she does wake up in the middle of the night and has trouble going back to sleep.  She feels that the increase in the Sinemet and the addition of the long-acting Sinemet has been helpful.  She has  physical therapy at her skilled nursing facility.  She usually is able to walk with her Stephanie Shaw, she indicates that she has a 2 wheeled Stephanie Shaw which she did not bring today, she is in a wheelchair.  She denies a family history of Parkinson's disease with the exception of one paternal cousin that had Parkinson's disease.   REVIEW OF SYSTEMS: Out of a complete 14 system review of symptoms, the patient complains only of the following symptoms, neck, shoulder stiffness, imbalance, leg swelling, and all other reviewed systems are negative.  ALLERGIES: Allergies  Allergen Reactions  . Doxycycline Nausea And Vomiting  . Sulfonamide Derivatives     REACTION: HIVES    HOME MEDICATIONS: Outpatient Medications Prior to Visit  Medication Sig Dispense Refill  . acetaminophen (TYLENOL) 500 MG tablet Take 1,000 mg by mouth 3 (three) times daily. Not to exceed 3000 mg in 24 ( scheduled + Prn doses    . calcium carbonate (TUMS EX) 750 MG chewable tablet Chew 1 tablet by mouth daily. Between the hours of 7am-10am.    . carbidopa-levodopa (SINEMET CR) 50-200 MG tablet Take 1 tablet by mouth at bedtime. 9pm    . carbidopa-levodopa (SINEMET IR) 25-100 MG tablet Take 2 tablets by mouth in the morning, at noon, in the evening, and at bedtime. Morning: 7am, MidDay 11am, Afternoon 3pm, Evening 7pm.    . Cholecalciferol (VITAMIN D3) 10 MCG (400 UNIT) CAPS Take 1 capsule by mouth daily. Between the hours of 7am-10am.    . diltiazem (CARDIZEM CD) 120 MG 24 hr capsule Take 120 mg by mouth daily. Between the hours of 7am-10am. HOLD IF SBP < 100    . hydrocortisone 2.5 % ointment Apply 1 application topically at bedtime. At bedtime for 1 week then change to PRN.    . methocarbamol (ROBAXIN) 500 MG tablet Take 250 mg by mouth daily. As needed    . metolazone (ZAROXOLYN) 2.5 MG tablet Take 2.5 mg by mouth. Once A Day on Mon, and Thurs at 7:30am. . Hold if systolic blood pressure less than 100    . mirabegron ER (MYRBETRIQ)  50 MG TB24 tablet Take 50 mg by mouth daily. Between the hours of 7am-10am.    . omeprazole (PRILOSEC) 20 MG capsule Take 20 mg by mouth daily.    . polyethylene glycol (MIRALAX / GLYCOLAX) 17 g packet Take 17 g by mouth daily.    . rivaroxaban (XARELTO) 20 MG TABS tablet Take 20 mg by mouth every evening. Between the hours of 5pm-6pm.    . sennosides-docusate sodium (SENOKOT-S) 8.6-50 MG tablet Take 1 tablet by mouth at bedtime. Between the hours of 7pm-10pm.    . Sodium Fluoride (CLINPRO 5000) 1.1 % PSTE Place onto teeth at bedtime. Between the hours of 7pm-10pm.    . zinc oxide 20 % ointment Apply 1 application topically as needed for irritation. To buttocks after every incontinent episode and as needed for redness. May keep at bedside.     No facility-administered medications prior to  visit.    PAST MEDICAL HISTORY: Past Medical History:  Diagnosis Date  . Acute lower UTI 09/14/2018  . Anemia 10/15/2018   2016 colonoscopy 10/14/18 wbc 5.4, Hgb 10.1, plt 184 12/01/18 wbc 4.5, Hgb 11.1, plt 188, neutrophils 63,  Na 139, K 3.7, Bun 23, creat 0.69, eGFR 83 on Fe, Hgb 11.7 12/09/18   . Atrial fibrillation ([place]) 05/21/2010   10/07/18 Na 143, K 4.0, Bun 20, creat 0.79, eGFR 72, wbc 7.1, Hgb 10.9, plt 172 10/28/18 Na 142, K 4.0, Bun 19, creat 0.77, eGFR 74 11/18/18 Na 144, K 4.1, Bun 21, creat 0.69, eGFR 83  . Bilateral lower extremity edema 07/19/2018   11/02/18 BMP 2 weeks.   . Cervical pain (neck) 06/20/2010  . Closed fracture of part of upper end of humerus 05/01/2015  . Colles' fracture of right radius 03/05/2015  . Constipation 07/19/2018  . DEGENERATIVE JOINT DISEASE, RIGHT HIP 02/16/2007  . Dupuytren's contracture   . Dysuria 09/20/2018   09/20/18 c/o got up several times last night to go urinate, burning on urination, lower abd /back discomfort, but urinary frequency, leakage are not new. UA C/S, Pyridium 111m tid x 2 days.  09/21/18 wbc 6.1, Hgb 11.8, plt 210, neutrophil 69.4, Na 142, K 4.1, Bun  19, creat 0.78, TP 6.4, albumin 3.9  . Hematuria 02/04/2014  . Hypotension 09/18/2018  . Long term (current) use of anticoagulants 05/29/2016  . Osteoarthritis of hip    right  . Osteoarthritis of left knee   . Overactive bladder 10/06/2018  . Parkinson disease ([place]   . Par[place]mal [place]fib ([place]   . POSTMENOPAUSAL SYNDROME 02/16/2007  . PREMATURE ATRIAL CONTRACTIONS 02/16/2007  . 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 S/P breast biopsy, left    two o'clock position - benign  . 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 Tremor, unspecified 10/19/2015  . Unstable gait 02/16/2017  . Vaginal atrophy 10/19/2015  . Venous insufficiency   . Weakness 09/12/2018    PAST SURGICAL HISTORY: Past Surgical History:  Procedure Laterality Date  . BREAST BIOPSY Left   . CATARACT EXTRACTION, BILATERAL      FAMILY HISTORY: Family History  Problem Relation Age of Onset  . Cancer Father        ? type  . Alcohol abuse Father   . Cancer Mother        Pancreatic  . Diabetes Mother   . Heart failure Mother   . Osteoarthritis Sister   . Healthy Sister   . Healthy Child   . Breast cancer Neg Hx     SOCIAL HISTORY: Social History   Socioeconomic History  . Marital status: Divorced    Spouse name: Not on file  . Number of children: 2  . Years of education: Not on file  . Highest education level: Master's degree (e.g., MA, MS, MEng, MEd, MSW, MBA)  Occupational History  . Occupation: retired    Comment: math, [employer]  Tobacco Use  . Smoking status: Former Smoker    Quit date: 08/27/1971    Years since quitting: 47.7  . Smokeless tobacco: Never Used  Substance and Sexual Activity  . Alcohol use: No    Alcohol/week: 0.0 standard drinks  . Drug use: No  . Sexual activity: Not Currently    Comment: 1st intercourse 79 yo-Fewer than 5 partners  Other Topics Concern  . Not on file  Social History Narrative  . Not on file   Social Determinants of Health  Financial Resource Strain:     . Difficulty of Paying Living Expenses:   Food Insecurity:   . Worried About Charity fundraiser in the Last Year:   . Arboriculturist in the Last Year:   Transportation Needs:   . Film/video editor (Medical):   Marland Kitchen Lack of Transportation (Non-Medical):   Physical Activity:   . Days of Exercise per Week:   . Minutes of Exercise per Session:   Stress:   . Feeling of Stress :   Social Connections:   . Frequency of Communication with Friends and Family:   . Frequency of Social Gatherings with Friends and Family:   . Attends Religious Services:   . Active Member of Clubs or Organizations:   . Attends Archivist Meetings:   Marland Kitchen Marital Status:   Intimate Partner Violence:   . Fear of Current or Ex-Partner:   . Emotionally Abused:   Marland Kitchen Physically Abused:   . Sexually Abused:       PHYSICAL EXAM  Vitals:   05/16/19 1017  BP: (!) 97/56  Pulse: 70   There is no height or weight on file to calculate BMI.  Generalized: Well developed, in no acute distress, flat affect Cardiology: normal rate and rhythm, no murmur noted Respiratory: clear to auscultation bilaterally  Neurological examination  Mentation: Alert oriented to time, place, most history taking. Daughter assists with history. Patient slow in responses. Follows all commands.  Cranial nerve II-XII: Pupils were equal round reactive to light. Extraocular movements were full, visual field were full on confrontational test. Facial sensation and strength were normal. Uvula tongue midline. Head turning and shoulder shrug  were normal and symmetric. Motor: The motor testing reveals 4 over 5 strength of all 4 extremities. Bradykinesia noted with finger and toe taps, cog wheel rigidity noted. no tremor noted today Sensory: Sensory testing is intact to soft touch on all 4 extremities. No evidence of extinction is noted.  Coordination: Cerebellar testing reveals slow finger-nose-finger and heel-to-shin bilaterally.  Gait and  station: Gait not assessed, patient in wheelchair.  Reflexes: Deep tendon reflexes are symmetric and normal bilaterally.   DIAGNOSTIC DATA (LABS, IMAGING, TESTING) - I reviewed patient records, labs, notes, testing and imaging myself where available.  MMSE - Mini Mental State Exam 11/03/2017  Not completed: (No Data)     Lab Results  Component Value Date   WBC 5.9 04/14/2019   HGB 9.7 (A) 04/14/2019   HCT 30 (A) 04/14/2019   MCV 93.9 09/13/2018   PLT 192 04/14/2019      Component Value Date/Time   NA 140 04/01/2019 0000   K 4.8 04/01/2019 0000   CL 104 04/01/2019 0000   CL 104 10/28/2018 0000   CO2 31 (A) 04/01/2019 0000   CO2 28 10/28/2018 0000   GLUCOSE 109 (H) 09/14/2018 0043   GLUCOSE 93 12/25/2005 0935   BUN 33 (A) 04/01/2019 0000   CREATININE 1.0 04/01/2019 0000   CREATININE 0.68 09/14/2018 0043   CREATININE 0.92 06/14/2015 0853   CALCIUM 9.3 04/01/2019 0000   CALCIUM 9.9 10/28/2018 0000   PROT 6.4 09/21/2018 0000   ALBUMIN 3.8 04/01/2019 0000   ALBUMIN 3.9 09/21/2018 0000   AST 12 (A) 04/01/2019 0000   ALT 5 (A) 03/24/2019 0000   ALKPHOS 48 04/01/2019 0000   BILITOT 0.6 09/13/2018 0201   GFRNONAA 54 04/01/2019 0000   GFRNONAA 72 10/07/2018 0000   GFRAA 63 04/01/2019 0000   Lab Results  Component Value Date   CHOL 185 03/24/2019   HDL 79 (A) 03/24/2019   LDLCALC 94 03/24/2019   LDLDIRECT 104.2 01/25/2013   TRIG 41 03/24/2019   CHOLHDL 2 08/29/2015   No results found for: HGBA1C Lab Results  Component Value Date   VITAMINB12 1,217 (H) 09/12/2018   Lab Results  Component Value Date   TSH 0.408 09/11/2018       ASSESSMENT AND PLAN 79 y.o. year old female  has a past medical history of Acute lower UTI (09/14/2018), Anemia (10/15/2018), Atrial fibrillation ([place]) (05/21/2010), Bilateral lower extremity edema (07/19/2018), Cervical pain (neck) (06/20/2010), Closed fracture of part of upper end of humerus (05/01/2015), Colles' fracture of right radius  (03/05/2015), Constipation (07/19/2018), DEGENERATIVE JOINT DISEASE, RIGHT HIP (02/16/2007), Dupuytren's contracture, Dysuria (09/20/2018), Hematuria (02/04/2014), Hypotension (09/18/2018), Long term (current) use of anticoagulants (05/29/2016), Osteoarthritis of hip, Osteoarthritis of left knee, Overactive bladder (10/06/2018), Parkinson disease ([place]), Paroxysmal A-fib ([place]), POSTMENOPAUSAL SYNDROME (02/16/2007), PREMATURE ATRIAL CONTRACTIONS (02/16/2007), Primary osteoarthritis of right shoulder (04/27/2017), S/P breast biopsy, left, Toxic effect of venom(989.5) (07/27/2007), Tremor, unspecified (10/19/2015), Unstable gait (02/16/2017), Vaginal atrophy (10/19/2015), Venous insufficiency, and Weakness (09/12/2018). here with     ICD-10-CM   1. Parkinson's disease ([place])  G20   2. Fatigue, unspecified type  R53.83   3. Hypotension, unspecified hypotension type  I95.9   4. Chronic right shoulder pain  M25.511    G89.29     Stephanie Shaw reports that, overall, PD symptoms are fairly stable from previous visit with Dr Rexene Alberts. She and her daughter are interested in considering an increased dose of Sinemet to help with neck and shoulder stiffness. She has tolerated Sinemet IR 25-159m 2 tablets every 4 hours and Sinemet CR 50-2065mat bedtime. She reports that baseline BP is 90's/60's. I have discussed concerns of low BP readings with history of hypotension and history of falls. I am concerned that Sinemet or Azilect could have adverse effects on BP. I have discussed care plan with Dr AtRexene Albertsho agrees that we may consider decreasing Sinemet IR dosing to 1.5 tablets 4 times daily and add Sinemet CR in the am for twice daily dosing. Stephanie Shaw that she wishes to continue current treatment plan for now but will consider changes for worsening symptoms. She was encouraged to continue physical therapy as directed. She will stay well hydrated. She is taking Miralax daily. Daughter request follow up appointment within 6-8 weeks for  review of symptoms and continued discussion of treatment options. I have discussed progressive nature of PD and expectations of care. Both Stephanie Shaw her daughter verbalize understanding and agreement with this plan.    No orders of the defined types were placed in this encounter.    No orders of the defined types were placed in this encounter.     I spent 45 minutes with the patient. 50% of this time was spent counseling and educating patient on plan of care and medications.    AmDebbora PrestoFNP-C 05/18/2019, 1:59 PM [facility] [id number]SuRed CrossNC [id number] reviewed the above note and documentation by the Nurse Practitioner and agree with the history, exam, assessment and plan as outlined above. I was available for consultation. SaStar AgeMD, PhD [facility] ([facility]

## 2019-05-16 NOTE — Patient Instructions (Signed)
We will continue current treatment plan. We will discuss option of adding an additional dose of Sinemet CR 50-200mg  to current dosing with Dr Rexene Alberts. We may need to lower IR dosing to 1.5 tablets four times daily due to your blood pressures being on the lower side. Pending her response, I will contact you with recommendations. You may decide to continue current treatment without any changes if you wish. We will also consider Azilect after further review of your medicaitons and any potential medication interactions. Please stay as active as possible. Fall precautions advised. Continue heart healthy diet and monitor leg swelling. Follow up with PCP for worsening of leg edema.   Follow up with Dr Rexene Alberts on 06/29/2019 at 10:30, please arrive to the office by 10am for check in.     Parkinson's Disease Parkinson's disease causes problems with movements. It is a long-term condition. It gets worse over time (is progressive). It affects each person in different ways. It makes it harder for you to:  Control how your body moves.  Move your body normally. The condition can range from mild to very bad (advanced). What are the causes? This condition results from a loss of brain cells called neurons. These brain cells make a chemical called dopamine, which is needed to control body movement. As the condition gets worse, the brain cells make less dopamine. This makes it hard to move or control your movements. The exact cause of this condition is not known. What increases the risk?  Being female.  Being age 26 or older.  Having family members who had Parkinson's disease.  Having had an injury to the brain.  Being very sad (depressed).  Being around things that are harmful or poisonous. What are the signs or symptoms? Symptoms of this condition can vary. The main symptoms have to do with movement. These include:  A tremor or shaking while you are resting that you cannot control.  Stiffness in your neck,  arms, and legs.  Slowing of movement. This may include: ? Losing expressions of the face. ? Having trouble making small movements that are needed to button your clothing or brush your teeth.  Walking in a way that is not normal. You may walk with short, shuffling steps.  Loss of balance when standing. You may sway, fall backward, or have trouble making turns. Other symptoms include:  Being very sad, worried, or confused.  Seeing or hearing things that are not real.  Losing thinking abilities (dementia).  Trouble speaking or swallowing.  Having a hard time pooping (constipation).  Needing to pee right away, peeing often, or not being able to control when you pee or poop.  Sleep problems. How is this treated? There is no cure. The goal of treatment is to manage your symptoms. Treatment may include:  Medicines.  Therapy to help with talking or movement.  Surgery to reduce shaking and other movements that you cannot control. Follow these instructions at home: Medicines  Take over-the-counter and prescription medicines only as told by your doctor.  Avoid taking pain or sleeping medicines. Eating and drinking  Follow instructions from your doctor about what you cannot eat or drink.  Do not drink alcohol. Activity  Talk with your doctor about if it is safe for you to drive.  Do exercises as told by your doctor. Lifestyle      Put in grab bars and railings in your home. These help to prevent falls.  Do not use any products that contain nicotine or  tobacco, such as cigarettes, e-cigarettes, and chewing tobacco. If you need help quitting, ask your doctor.  Join a support group. General instructions  Talk with your doctor about what you need help with and what your safety needs are.  Keep all follow-up visits as told by your doctor, including any therapy visits to help with talking or moving. This is important. Contact a doctor if:  Medicines do not help your  symptoms.  You feel off-balance.  You fall at home.  You need more help at home.  You have trouble swallowing.  You have a very hard time pooping.  You have a lot of side effects from your medicines.  You feel very sad, worried, or confused. Get help right away if:  You were hurt in a fall.  You see or hear things that are not real.  You cannot swallow without choking.  You have chest pain or trouble breathing.  You do not feel safe at home.  You have thoughts about hurting yourself or others. If you ever feel like you may hurt yourself or others, or have thoughts about taking your own life, get help right away. You can go to your nearest emergency department or call:  Your local emergency services (911 in the [place]  A suicide crisis helpline, such as the [facility] at 580-441-9860. This is open 24 hours a day. Summary  This condition causes problems with movements.  It is a long-term condition. It gets worse over time.  There is no cure. Treatment focuses on managing your symptoms.  Talk with your doctor about what you need help with and what your safety needs are.  Keep all follow-up visits as told by your doctor. This is important. This information is not intended to replace advice given to you by your health care provider. Make sure you discuss any questions you have with your health care provider. Document Revised: 03/25/2018 Document Reviewed: 03/25/2018 Elsevier Patient Education  [place].

## 2019-05-17 ENCOUNTER — Encounter: Payer: Self-pay | Admitting: Family Medicine

## 2019-05-17 DIAGNOSIS — I10 Essential (primary) hypertension: Secondary | ICD-10-CM | POA: Diagnosis not present

## 2019-05-17 LAB — COMPREHENSIVE METABOLIC PANEL
Calcium: 9.4 (ref 8.7–10.7)
GFR calc Af Amer: 98
GFR calc non Af Amer: 84

## 2019-05-17 LAB — BASIC METABOLIC PANEL
BUN: 22 — AB (ref 4–21)
CO2: 31 — AB (ref 13–22)
Chloride: 103 (ref 99–108)
Creatinine: 0.7 (ref 0.5–1.1)
Glucose: 85
Potassium: 3.6 (ref 3.4–5.3)
Sodium: 140 (ref 137–147)

## 2019-05-17 LAB — CBC AND DIFFERENTIAL
HCT: 34 — AB (ref 36–46)
Hemoglobin: 11.4 — AB (ref 12.0–16.0)
Platelets: 184 (ref 150–399)
WBC: 5.2

## 2019-05-17 LAB — CBC: RBC: 3.57 — AB (ref 3.87–5.11)

## 2019-05-18 ENCOUNTER — Encounter: Payer: Self-pay | Admitting: Family Medicine

## 2019-05-19 NOTE — Telephone Encounter (Signed)
I called and spoke to [place], daughter, she will respond back to AL/NP today.  (she stated for now keep things way they are, but she had questions).  FYI

## 2019-05-19 NOTE — Telephone Encounter (Signed)
-----   Message from Debbora Presto, NP sent at 05/18/2019  7:24 AM EDT ----- Terence Lux. Can you make sure that Ms Henly knows that I have sent her a message on Mychart? TY! ----- Message ----- From: Star Age, MD Sent: 05/16/2019   4:52 PM EDT To: Debbora Presto, NP  Thanks for seeing patient today.  I am worried about her low blood pressure values.  In fact, I would probably favor reducing the individual Sinemet dose to 1-1/2 pills 4 times daily and then you could potentially add a morning dose of Sinemet CR.  I more worried about the low blood pressure values causing her problems including falls secondary to hypotension. So essentially, I would recommend reducing the Sinemet to 1-1/2 pills 4 times daily and adding a second dose of Sinemet CR in the morning.  sa ----- Message ----- From: Debbora Presto, NP Sent: 05/16/2019   4:04 PM EDT To: Star Age, MD  Hey there Dr Rexene Alberts. I saw Mrs Degolier for follow up today. She is doing about the same from her previous visit with you in 12/2018. She is tolerating Sinemet 25-100mg  2 tablets QID and Sinemet 50-200mg  at bedtime. She is having more stiffness in her neck and shoulders that she feels is relieved with Sinemet dosing. I have discussed adjustment in Sinemet dosing to consider adding morning dose of Sinemet CR. Her BP readings are on the lower end. She is not having any current symptoms of hypotension but does have previous history. I am concerned that adding extra dose will make this more of an issue. Azilect discussed too but wit her being on diltiazem and metolazone, I am a little hesitant. Could you provide any suggestions with her care? Her daughter is very anxious to get her feeling better but I have also tried to relay realistic goals of care and functioning with progressive PD. I am not sure what would be the best approach with her. Would it be reasonable to consider decreasing IR dosing to 1.5 tablets QID and increase CR dosing to twice daily? Any other  thoughts?

## 2019-05-26 ENCOUNTER — Non-Acute Institutional Stay (SKILLED_NURSING_FACILITY): Payer: Medicare PPO | Admitting: Internal Medicine

## 2019-05-26 ENCOUNTER — Encounter: Payer: Self-pay | Admitting: Internal Medicine

## 2019-05-26 DIAGNOSIS — I959 Hypotension, unspecified: Secondary | ICD-10-CM

## 2019-05-26 DIAGNOSIS — M25551 Pain in right hip: Secondary | ICD-10-CM

## 2019-05-26 DIAGNOSIS — G20C Parkinsonism, unspecified: Secondary | ICD-10-CM

## 2019-05-26 DIAGNOSIS — I4819 Other persistent atrial fibrillation: Secondary | ICD-10-CM

## 2019-05-26 DIAGNOSIS — L03115 Cellulitis of right lower limb: Secondary | ICD-10-CM | POA: Diagnosis not present

## 2019-05-26 DIAGNOSIS — D649 Anemia, unspecified: Secondary | ICD-10-CM

## 2019-05-26 DIAGNOSIS — R600 Localized edema: Secondary | ICD-10-CM | POA: Diagnosis not present

## 2019-05-26 DIAGNOSIS — G20 Parkinson's disease: Secondary | ICD-10-CM

## 2019-05-26 NOTE — Progress Notes (Signed)
Location:  [facility] Room Number: 13-A Place of Service:  SNF ([id number])  Provider:   Code Status: Full Code Goals of Care:  Advanced Directives 05/26/2019  Does Patient Have a Medical Advance Directive? Yes  Type of Advance Directive [place]  Does patient want to make changes to medical advance directive? No - Patient declined  Copy of [place] in Chart? Yes - validated most recent copy scanned in chart (See row information)  Would patient like information on creating a medical advance directive? -  Pre-existing out of facility DNR order (yellow form or pink MOST form) -     Chief Complaint  Patient presents with  . Medical Management of Chronic Issues    Routine [facility] visit    HPI: Patient is a 79 y.o. female seen today for medical management of chronic diseases.    Patient has a history of Parkinson disease diagnosed 2 years ago on Sinemet and follows withNeurology,paroxysmal A. fib on Xarelto, hyperlipidemia, H/o Ovarian Cyst, Lower extremity edema,2 D echo with N EF Biatrial enlargementand Urinary Incontinence Has Chronic Right Hip Pain. 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  Right Leg Redness and Pain Some swelling and redness also c/o Pian in that area Does not want to wear Ted hoses Bilateral LE edema Swelling much better on 3/week Metolazone Right Hip pain More Controlled  Hypotension BP runs less then 100 sometimes Her only complain is Dizziness when sits up in the morning Parkinson disease Stable on Sinemet dosing C/o Constipation. Wants to try Prune Juice No other issues. Appetite Good weight stable Past Medical History:  Diagnosis Date  . Acute lower UTI 09/14/2018  . Anemia 10/15/2018   2016 colonoscopy 10/14/18 wbc 5.4, Hgb 10.1, plt 184 12/01/18 wbc 4.5, Hgb 11.1, plt 188, neutrophils 63,  Na 139, K 3.7, Bun 23,  creat 0.69, eGFR 83 on Fe, Hgb 11.7 12/09/18   . Atrial fibrillation ([place]) 05/21/2010   10/07/18 Na 143, K 4.0, Bun 20, creat 0.79, eGFR 72, wbc 7.1, Hgb 10.9, plt 172 10/28/18 Na 142, K 4.0, Bun 19, creat 0.77, eGFR 74 11/18/18 Na 144, K 4.1, Bun 21, creat 0.69, eGFR 83  . Bilateral lower extremity edema 07/19/2018   11/02/18 BMP 2 weeks.   . Cervical pain (neck) 06/20/2010  . Closed fracture of part of upper end of humerus 05/01/2015  . Colles' fracture of right radius 03/05/2015  . Constipation 07/19/2018  . DEGENERATIVE JOINT DISEASE, RIGHT HIP 02/16/2007  . Dupuytren's contracture   . Dysuria 09/20/2018   09/20/18 c/o got up several times last night to go urinate, burning on urination, lower abd /back discomfort, but urinary frequency, leakage are not new. UA C/S, Pyridium 115m tid x 2 days.  09/21/18 wbc 6.1, Hgb 11.8, plt 210, neutrophil 69.4, Na 142, K 4.1, Bun 19, creat 0.78, TP 6.4, albumin 3.9  . Hematuria 02/04/2014  . Hypotension 09/18/2018  . Long term (current) use of anticoagulants 05/29/2016  . Osteoarthritis of hip    right  . Osteoarthritis of left knee   . Overactive bladder 10/06/2018  . [place] disease ([place]   . [place]ox[place]mal [place]   . POSTMENOPAUSAL SYNDROME 02/16/2007  . PREMATURE ATRIAL CONTRACTIONS 02/16/2007  . 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 S/P breast biopsy, left    two o'clock position - benign  . 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  Tremor, unspecified 10/19/2015  . Unstable gait 02/16/2017  . Vaginal atrophy 10/19/2015  . Venous insufficiency   . Weakness 09/12/2018    Past Surgical History:  Procedure Laterality Date  . BREAST BIOPSY Left   . CATARACT EXTRACTION, BILATERAL      Allergies  Allergen Reactions  . Doxycycline Nausea And Vomiting  . Sulfonamide Derivatives     REACTION: HIVES    Outpatient Encounter Medications as of 05/26/2019  Medication Sig  . acetaminophen (TYLENOL) 500 MG tablet Take 1,000 mg by mouth 3 (three)  times daily as needed.   . calcium carbonate (TUMS EX) 750 MG chewable tablet Chew 1 tablet by mouth daily. Between the hours of 7am-10am.  . carbidopa-levodopa (SINEMET CR) 50-200 MG tablet Take 1 tablet by mouth at bedtime. 9pm  . carbidopa-levodopa (SINEMET IR) 25-100 MG tablet Take 2 tablets by mouth in the morning, at noon, in the evening, and at bedtime. Morning: 7am, MidDay 11am, Afternoon 3pm, Evening 7pm.  . Cholecalciferol (VITAMIN D3) 10 MCG (400 UNIT) CAPS Take 1 capsule by mouth daily. Between the hours of 7am-10am.  . diltiazem (CARDIZEM CD) 120 MG 24 hr capsule Take 120 mg by mouth daily. Between the hours of 7am-10am. HOLD IF SBP < 100  . hydrocortisone 2.5 % ointment Apply 1 application topically at bedtime. At bedtime for 1 week then change to PRN.  . methocarbamol (ROBAXIN) 500 MG tablet Take 250 mg by mouth daily. As needed  . metolazone (ZAROXOLYN) 2.5 MG tablet Take 2.5 mg by mouth every Monday, Wednesday, and Friday. Hold if systolic blood pressure less than 100  . mirabegron ER (MYRBETRIQ) 50 MG TB24 tablet Take 50 mg by mouth daily. Between the hours of 7am-10am.  . omeprazole (PRILOSEC) 20 MG capsule Take 20 mg by mouth daily.  . polyethylene glycol (MIRALAX / GLYCOLAX) 17 g packet Take 17 g by mouth daily.  . rivaroxaban (XARELTO) 20 MG TABS tablet Take 20 mg by mouth every evening. Between the hours of 5pm-6pm.  . sennosides-docusate sodium (SENOKOT-S) 8.6-50 MG tablet Take 1 tablet by mouth at bedtime. Between the hours of 7pm-10pm.  . Sodium Fluoride (CLINPRO 5000) 1.1 % PSTE Place onto teeth at bedtime. Between the hours of 7pm-10pm.  . zinc oxide 20 % ointment Apply 1 application topically as needed for irritation. To buttocks after every incontinent episode and as needed for redness. May keep at bedside.   No facility-administered encounter medications on file as of 05/26/2019.    Review of Systems:  Review of Systems  Constitutional: Negative.   HENT: Negative.    Respiratory: Negative.   Cardiovascular: Positive for leg swelling.  Gastrointestinal: Positive for constipation.  Genitourinary: Positive for frequency.  Musculoskeletal: Positive for arthralgias and gait problem.  Skin: Positive for rash.  Neurological: Positive for light-headedness.  Psychiatric/Behavioral: Negative.   All other systems reviewed and are negative.   Health Maintenance  Topic Date Due  . COLONOSCOPY  06/12/2019  . INFLUENZA VACCINE  08/21/2019  . TETANUS/TDAP  03/16/2024  . DEXA SCAN  Completed  . COVID-19 Vaccine  Completed  . PNA vac Low Risk Adult  Completed    Physical Exam: Vitals:   05/26/19 1510  BP: 98/60  Pulse: 77  Resp: 19  Temp: 97.6 F (36.4 C)  TempSrc: Oral  SpO2: 94%  Weight: 136 lb 6.4 oz (61.9 kg)  Height: '5\' 8"'  (1.727 m)   Body mass index is 20.74 kg/m. Physical Exam  Constitutional: Oriented to person,  place, and time. Well-developed and well-nourished.  HENT:  Head: Normocephalic.  Mouth/Throat: Oropharynx is clear and moist.  Eyes: Pupils are equal, round, and reactive to light.  Neck: Neck supple.  Cardiovascular: Normal rate and normal heart sounds.  No murmur heard. Pulmonary/Chest: Effort normal and breath sounds normal. No respiratory distress. No wheezes. She has no rales.  Abdominal: Soft. Bowel sounds are normal. No distension. There is no tenderness. There is no rebound.  Musculoskeletal: Mild Edema Bilateral Right Leg has redness in the front of leg. Area also very tender.  Lymphadenopathy: none Neurological: Alert and oriented to person, place, and time.  Skin: Skin is warm and dry.  Psychiatric: Normal mood and affect. Behavior is normal. Thought content normal.    Labs reviewed: Basic Metabolic Panel: Recent Labs    09/11/18 2105 09/11/18 2105 09/12/18 0304 09/12/18 0304 09/13/18 0201 09/13/18 0201 09/14/18 0043 09/21/18 0000 03/24/19 0000 04/01/19 0000 05/17/19 0000  NA 139   < > 140   < >  140   < > 140   < > 141 140 140  K 3.7   < > 3.6   < > 3.8   < > 3.9   < > 4.4 4.8 3.6  CL 106   < > 108   < > 112*   < > 111   < > 107 104 103  CO2 22   < > 21*   < > 22   < > 23   < > 28* 31* 31*  GLUCOSE 99   < > 89  --  95  --  109*  --   --   --   --   BUN 29*   < > 27*   < > 24*   < > 15   < > 26* 33* 22*  CREATININE 0.98   < > 0.88   < > 0.75   < > 0.68   < > 0.7 1.0 0.7  CALCIUM 9.9   < > 9.2   < > 8.4*   < > 8.5*   < > 9.5 9.3 9.4  MG  --   --   --   --  2.0  --   --   --   --   --   --   TSH 0.408  --   --   --   --   --   --   --   --   --   --    < > = values in this interval not displayed.   Liver Function Tests: Recent Labs    09/12/18 0304 09/12/18 0304 09/13/18 0201 09/13/18 0201 09/21/18 0000 12/09/18 0000 03/24/19 0000 04/01/19 0000  AST 60*   < > 50*   < > 20 16 11* 12*  ALT 7   < > 7   < > 20 10 5*  --   ALKPHOS 45   < > 36*   < > 42 47 50 48  BILITOT 1.1  --  0.6  --   --   --   --   --   PROT 6.2*  --  4.9*  --  6.4  --   --   --   ALBUMIN 3.7   < > 2.8*   < > 3.9 3.8 4.0 3.8   < > = values in this interval not displayed.   No results for input(s): LIPASE, AMYLASE in the last 8760 hours.  No results for input(s): AMMONIA in the last 8760 hours. CBC: Recent Labs    09/11/18 2105 09/11/18 2105 09/12/18 0304 09/12/18 0304 09/13/18 0201 09/21/18 0000 12/09/18 0000 12/09/18 0000 03/24/19 0000 04/01/19 0000 04/07/19 0000 04/14/19 0000 05/17/19 0000  WBC 10.6*   < > 8.0   < > 8.9   < > 5.5   < > 8.4   < > 6.6 5.9 5.2  NEUTROABS 9.4*   < >  --   --  7.1  --  3,718  --  6,602  --   --   --   --   HGB 12.6   < > 11.5*   < > 10.5*   < > 11.7*   < > 10.9*   < > 9.3* 9.7* 11.4*  HCT 40.1   < > 35.2*   < > 32.4*   < > 37   < > 34*   < > 29* 30* 34*  MCV 96.4  --  94.4  --  93.9  --   --   --   --   --   --   --   --   PLT 170   < > 150   < > 125*   < > 189   < > 190   < > 199 192 184   < > = values in this interval not displayed.   Lipid Panel: Recent  Labs    12/09/18 0000 03/24/19 0000  CHOL 185 185  HDL 78* 79*  LDLCALC 94 94  TRIG 45 41   No results found for: HGBA1C  Procedures since last visit: No results found.  Assessment/Plan Cellulitis of right leg Levaquin 500 mg QD for 5 days. Patient allergic to Doxy and Bactrum  Bilateral lower extremity edema Doing well on Metolazone Will keep it 3/week BUN and Creat stable Started on Potassium  Anemia, unspecified type Restarted Iron  Right hip pain Doing well with Pain control On restorative therapy Does not want surgery right now  Persistent atrial fibrillation (HCC) On Xarelto and Cardizem  Parkinsonism, unspecified Parkinsonism type ([place]) Doing well with Sinemet   Hypotension, unspecified hypotension type BP sometimes low ? Consider Midodrine if Patient gets Symptomatic Constipation Wants to try Prune Already on Miralax and Senna Urinary Incontinence Symptoms are controlled on Myrebetriq  Labs/tests ordered:  * No order type specified * Next appt:  Visit date not found  Total time spent in this patient care encounter was  45_  minutes; greater than 50% of the visit spent counseling patient and staff, reviewing records , Labs and coordinating care for problems addressed at this encounter.

## 2019-06-22 ENCOUNTER — Encounter: Payer: Self-pay | Admitting: Nurse Practitioner

## 2019-06-22 ENCOUNTER — Non-Acute Institutional Stay (SKILLED_NURSING_FACILITY): Payer: Medicare PPO | Admitting: Nurse Practitioner

## 2019-06-22 DIAGNOSIS — K5900 Constipation, unspecified: Secondary | ICD-10-CM | POA: Diagnosis not present

## 2019-06-22 DIAGNOSIS — M25551 Pain in right hip: Secondary | ICD-10-CM | POA: Diagnosis not present

## 2019-06-22 DIAGNOSIS — G20 Parkinson's disease: Secondary | ICD-10-CM | POA: Diagnosis not present

## 2019-06-22 DIAGNOSIS — I4819 Other persistent atrial fibrillation: Secondary | ICD-10-CM | POA: Diagnosis not present

## 2019-06-22 DIAGNOSIS — D649 Anemia, unspecified: Secondary | ICD-10-CM

## 2019-06-22 DIAGNOSIS — I872 Venous insufficiency (chronic) (peripheral): Secondary | ICD-10-CM

## 2019-06-22 DIAGNOSIS — K219 Gastro-esophageal reflux disease without esophagitis: Secondary | ICD-10-CM | POA: Diagnosis not present

## 2019-06-22 DIAGNOSIS — N3941 Urge incontinence: Secondary | ICD-10-CM

## 2019-06-22 DIAGNOSIS — G609 Hereditary and idiopathic neuropathy, unspecified: Secondary | ICD-10-CM

## 2019-06-22 NOTE — Assessment & Plan Note (Signed)
Heart rate is in control, continue Diltiazem, Xarelto

## 2019-06-22 NOTE — Assessment & Plan Note (Signed)
Improved, Hgb 11.4 05/17/19

## 2019-06-22 NOTE — Assessment & Plan Note (Signed)
Stable, continue  MiraLax qd, Senokot S qd  

## 2019-06-22 NOTE — Assessment & Plan Note (Signed)
Stable, continue Omeprazole.  

## 2019-06-22 NOTE — Progress Notes (Signed)
Location:   SNF [place] Room Number: 13 Place of Service:  SNF (31) Provider: [facility] Cesiah Westley NP  [name], MD  Patient Care Team: [name], MD as PCP - General (Internal Medicine) [name], [name], MD as PCP - Cardiology (Cardiology) [name], [name], DO as Consulting Physician (Neurology) Asmar Brozek X, NP as Nurse Practitioner (Internal Medicine)  Extended Emergency Contact Information Primary Emergency Contact: Shirelle, Tootle Mobile Phone: [phone] Relation: Daughter Interpreter needed? No Secondary Emergency Contact: [facility] Address: [address]          [place], [place] [address] [name] of [place] Phone: [phone] Mobile Phone: [phone] Relation: Son  Code Status:  DNR Goals of care: Advanced Directive information Advanced Directives 06/22/2019  Does Patient Have a Medical Advance Directive? Yes  Type of Advance Directive [place]  Does patient want to make changes to medical advance directive? No - Patient declined  Copy of [place] in Chart? Yes - validated most recent copy scanned in chart (See row information)  Would patient like information on creating a medical advance directive? -  Pre-existing out of facility DNR order (yellow form or pink MOST form) -     Chief Complaint  Patient presents with  . Medical Management of Chronic Issues    Routine [facility] visit    HPI:  Pt is a 79 y.o. female seen today for medical management of chronic diseases.   The patient has Hx of constipation, stable, on MiraLax qd, Senokot S I qd. Urinary frequency, bathroom trips 0-1/night, on Mirabegron. PVD/trace edema BLE, on Metolazone 2.39m 2x/wk. Afib, heart rate is in control, on Xarelto 284mqd, Diltiazem 12048md. R hip pain, resolved, on Tylenol 1000m63md prn, Methocarbamol 250mg55m Parkinson's stable, f/u neurology, on Sinemet.  Anemia, stable, Hgb 11s,  GERD, stable, on  Omeprazole 20mg 14m     Past Medical History:  Diagnosis Date  . Acute lower UTI 09/14/2018  . Anemia 10/15/2018   2016 colonoscopy 10/14/18 wbc 5.4, Hgb 10.1, plt 184 12/01/18 wbc 4.5, Hgb 11.1, plt 188, neutrophils 63,  Na 139, K 3.7, Bun 23, creat 0.69, eGFR 83 on Fe, Hgb 11.7 12/09/18   . Atrial fibrillation (HCC) [place]   10/07/18 Na 143, K 4.0, Bun 20, creat 0.79, eGFR 72, wbc 7.1, Hgb 10.9, plt 172 10/28/18 Na 142, K 4.0, Bun 19, creat 0.77, eGFR 74 11/18/18 Na 144, K 4.1, Bun 21, creat 0.69, eGFR 83  . Bilateral lower extremity edema 07/19/2018   11/02/18 BMP 2 weeks.   . Cervical pain (neck) 06/20/2010  . Closed fracture of part of upper end of humerus 05/01/2015  . Colles' fracture of right radius 03/05/2015  . Constipation 07/19/2018  . DEGENERATIVE JOINT DISEASE, RIGHT HIP 02/16/2007  . Dupuytren's contracture   . Dysuria 09/20/2018   09/20/18 c/o got up several times last night to go urinate, burning on urination, lower abd /back discomfort, but urinary frequency, leakage are not new. UA C/S, Pyridium 100mg t20m 2 days.  09/21/18 wbc 6.1, Hgb 11.8, plt 210, neutrophil 69.4, Na 142, K 4.1, Bun 19, creat 0.78, TP 6.4, albumin 3.9  . Hematuria 02/04/2014  . Hypotension 09/18/2018  . Long term (current) use of anticoagulants 05/29/2016  . Osteoarthritis of hip    right  . Osteoarthritis of left knee   . Overactive bladder 10/06/2018  . Parkinson disease (HCC)   [place] A-fib (  [place])   . POSTMENOPAUSAL SYNDROME 02/16/2007  . PREMATURE ATRIAL CONTRACTIONS 02/16/2007  . 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 S/P breast biopsy, left    two o'clock position - benign  . 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 Tremor, unspecified 10/19/2015  . Unstable gait 02/16/2017  . Vaginal atrophy 10/19/2015  . Venous insufficiency   . Weakness 09/12/2018   Past Surgical History:  Procedure Laterality Date  . BREAST BIOPSY Left   . CATARACT EXTRACTION, BILATERAL      Allergies    Allergen Reactions  . Doxycycline Nausea And Vomiting  . Sulfonamide Derivatives     REACTION: HIVES    Allergies as of 06/22/2019      Reactions   Doxycycline Nausea And Vomiting   Sulfonamide Derivatives    REACTION: HIVES      Medication List       Accurate as of June 22, 2019 11:59 PM. If you have any questions, ask your nurse or doctor.        acetaminophen 500 MG tablet Commonly known as: TYLENOL Take 1,000 mg by mouth 3 (three) times daily as needed.   calcium carbonate 750 MG chewable tablet Commonly known as: TUMS EX Chew 1 tablet by mouth daily. Between the hours of 7am-10am.   carbidopa-levodopa 25-100 MG tablet Commonly known as: SINEMET IR Take 2 tablets by mouth 4 (four) times daily. Morning: 6:30 am, MidDay 10:30 am, Afternoon 2:30 pm, Evening 6:30 pm .   carbidopa-levodopa 50-200 MG tablet Commonly known as: SINEMET CR Take 1 tablet by mouth at bedtime. 9pm   diltiazem 120 MG 24 hr capsule Commonly known as: CARDIZEM CD Take 120 mg by mouth daily. Between the hours of 7am-10am. HOLD IF SBP < 100   ferrous sulfate 325 (65 FE) MG tablet Take 325 mg by mouth every Monday, Wednesday, and Friday. Give with food   hydrocortisone 2.5 % ointment Apply 1 application topically at bedtime. At bedtime for 1 week then change to PRN.   methocarbamol 500 MG tablet Commonly known as: ROBAXIN Take 250 mg by mouth daily. As needed   metolazone 2.5 MG tablet Commonly known as: ZAROXOLYN Take 2.5 mg by mouth every Monday, Wednesday, and Friday. Hold if systolic blood pressure less than 100   Myrbetriq 50 MG Tb24 tablet Generic drug: mirabegron ER Take 50 mg by mouth daily. Between the hours of 7am-10am.   omeprazole 20 MG capsule Commonly known as: PRILOSEC Take 20 mg by mouth daily.   polyethylene glycol 17 g packet Commonly known as: MIRALAX / GLYCOLAX Take 17 g by mouth daily.   potassium chloride SA 20 MEQ tablet Commonly known as: KLOR-CON Take 20  mEq by mouth daily.   PreviDent 5000 Booster Plus 1.1 % Pste Generic drug: Sodium Fluoride Place 1 application onto teeth in the morning and at bedtime. What changed: Another medication with the same name was removed. Continue taking this medication, and follow the directions you see here. Changed by: Suleyman Ehrman X Johnice Riebe, NP   rivaroxaban 20 MG Tabs tablet Commonly known as: XARELTO Take 20 mg by mouth every evening. Between the hours of 5pm-6pm.   sennosides-docusate sodium 8.6-50 MG tablet Commonly known as: SENOKOT-S Take 1 tablet by mouth at bedtime. Between the hours of 7pm-10pm.   Vitamin D3 10 MCG (400 UNIT) Caps Take 1 capsule by mouth daily. Between the hours of 7am-10am.   zinc oxide 20 % ointment Apply 1 application topically as needed for irritation.  To buttocks after every incontinent episode and as needed for redness. May keep at bedside.       Review of Systems  Constitutional: Negative for fatigue, fever and unexpected weight change.  HENT: Positive for hearing loss. Negative for congestion and voice change.   Eyes: Negative for visual disturbance.  Respiratory: Negative for cough.   Cardiovascular: Positive for leg swelling.  Gastrointestinal: Negative for abdominal pain and constipation.  Genitourinary: Negative for dysuria, frequency and urgency.  Musculoskeletal: Positive for arthralgias and gait problem. Negative for myalgias.       Right hip pain is resolved.   Skin: Negative for color change.  Neurological: Negative for speech difficulty, light-headedness and numbness.       Moves slow. R+L great toe burning sensation at night when toes are touched by sheets, comes/goes  Psychiatric/Behavioral: Negative for agitation, behavioral problems and sleep disturbance.    Immunization History  Administered Date(s) Administered  . Influenza Split 10/20/2012  . Influenza Whole 10/21/2006  . Influenza, High Dose Seasonal PF 10/17/2016  . Influenza-Unspecified  10/20/2013, 10/22/2017  . Moderna SARS-COVID-2 Vaccination 01/22/2019, 02/19/2019  . Pneumococcal Conjugate-13 01/31/2013  . Pneumococcal Polysaccharide-23 01/20/2005, 11/24/2011  . Td 01/21/2004  . Tdap 03/16/2014  . Zoster 04/17/2009   Pertinent  Health Maintenance Due  Topic Date Due  . COLONOSCOPY  06/12/2019  . INFLUENZA VACCINE  08/21/2019  . DEXA SCAN  Completed  . PNA vac Low Risk Adult  Completed   Fall Risk  05/03/2018 11/03/2017 08/04/2017 03/06/2017 11/26/2016  Falls in the past year? 1 No No Yes Yes  Number falls in past yr: 0 - - 2 or more 2 or more  Injury with Fall? 0 - - No No  Risk Factor Category  - - - High Fall Risk High Fall Risk  Risk for fall due to : Other (Comment) - - - -  Follow up Falls evaluation completed;Education provided;Falls prevention discussed - - Falls evaluation completed Falls evaluation completed   Functional Status Survey:    Vitals:   06/22/19 1408  BP: 118/70  Pulse: 79  Resp: (!) 21  Temp: (!) 97.3 F (36.3 C)  TempSrc: Oral  SpO2: 97%  Weight: 138 lb 4.8 oz (62.7 kg)  Height: '5\' 8"'  (1.727 m)   Body mass index is 21.03 kg/m. Physical Exam Vitals and nursing note reviewed.  Constitutional:      Appearance: Normal appearance.  HENT:     Head: Normocephalic and atraumatic.     Mouth/Throat:     Mouth: Mucous membranes are moist.  Eyes:     Extraocular Movements: Extraocular movements intact.     Conjunctiva/sclera: Conjunctivae normal.     Pupils: Pupils are equal, round, and reactive to light.  Cardiovascular:     Rate and Rhythm: Normal rate. Rhythm irregular.     Heart sounds: No murmur.     Comments: DP pulses present L>R, weak Pulmonary:     Breath sounds: No rales.  Abdominal:     Palpations: Abdomen is soft.     Tenderness: There is no abdominal tenderness.  Musculoskeletal:     Cervical back: Normal range of motion and neck supple.     Right lower leg: Edema present.     Left lower leg: Edema present.      Comments: Trace edema BLE. TED  Skin:    General: Skin is warm and dry.     Comments: Chronic venous insufficiency skin changes  Neurological:  General: No focal deficit present.     Mental Status: She is alert and oriented to person, place, and time. Mental status is at baseline.     Motor: Weakness present.     Coordination: Coordination abnormal.     Gait: Gait abnormal.     Comments: Moves slow, tremor in hands, not disabling, lower body weakness-uses w/c for mobility.   Psychiatric:        Mood and Affect: Mood normal.        Behavior: Behavior normal.        Thought Content: Thought content normal.     Labs reviewed: Recent Labs    09/12/18 0304 09/12/18 0304 09/13/18 0201 09/13/18 0201 09/14/18 0043 09/21/18 0000 03/24/19 0000 04/01/19 0000 05/17/19 0000  NA 140   < > 140   < > 140   < > 141 140 140  K 3.6   < > 3.8   < > 3.9   < > 4.4 4.8 3.6  CL 108   < > 112*   < > 111   < > 107 104 103  CO2 21*   < > 22   < > 23   < > 28* 31* 31*  GLUCOSE 89  --  95  --  109*  --   --   --   --   BUN 27*   < > 24*   < > 15   < > 26* 33* 22*  CREATININE 0.88   < > 0.75   < > 0.68   < > 0.7 1.0 0.7  CALCIUM 9.2   < > 8.4*   < > 8.5*   < > 9.5 9.3 9.4  MG  --   --  2.0  --   --   --   --   --   --    < > = values in this interval not displayed.   Recent Labs    09/12/18 0304 09/12/18 0304 09/13/18 0201 09/13/18 0201 09/21/18 0000 12/09/18 0000 03/24/19 0000 04/01/19 0000  AST 60*   < > 50*   < > 20 16 11* 12*  ALT 7   < > 7   < > 20 10 5*  --   ALKPHOS 45   < > 36*   < > 42 47 50 48  BILITOT 1.1  --  0.6  --   --   --   --   --   PROT 6.2*  --  4.9*  --  6.4  --   --   --   ALBUMIN 3.7   < > 2.8*   < > 3.9 3.8 4.0 3.8   < > = values in this interval not displayed.   Recent Labs    09/11/18 2105 09/11/18 2105 09/12/18 0304 09/12/18 0304 09/13/18 0201 09/21/18 0000 12/09/18 0000 12/09/18 0000 03/24/19 0000 04/01/19 0000 04/07/19 0000 04/14/19 0000  05/17/19 0000  WBC 10.6*   < > 8.0   < > 8.9   < > 5.5   < > 8.4   < > 6.6 5.9 5.2  NEUTROABS 9.4*   < >  --   --  7.1  --  3,718  --  6,602  --   --   --   --   HGB 12.6   < > 11.5*   < > 10.5*   < > 11.7*   < > 10.9*   < > 9.3*  9.7* 11.4*  HCT 40.1   < > 35.2*   < > 32.4*   < > 37   < > 34*   < > 29* 30* 34*  MCV 96.4  --  94.4  --  93.9  --   --   --   --   --   --   --   --   PLT 170   < > 150   < > 125*   < > 189   < > 190   < > 199 192 184   < > = values in this interval not displayed.   Lab Results  Component Value Date   TSH 0.408 09/11/2018   No results found for: HGBA1C Lab Results  Component Value Date   CHOL 185 03/24/2019   HDL 79 (A) 03/24/2019   LDLCALC 94 03/24/2019   LDLDIRECT 104.2 01/25/2013   TRIG 41 03/24/2019   CHOLHDL 2 08/29/2015    Significant Diagnostic Results in last 30 days:  No results found.  Assessment/Plan  Urge incontinence of urine Improving in urinary frequency and leakage, continue Mirabegron.   Pain in right hip No pain today, continue daily Methocarbamol, prn Tylenol.   Constipation Stable, continue MiraLax qd, Senokot S qd.   Anemia Improved, Hgb 11.4 05/17/19  Peripheral neuropathy BLE weakness, w/c for mobility.   Parkinsonism ([place]) Tremors in hands are not apparent, able to write, ADLs, continue Sinemet.   GERD (gastroesophageal reflux disease) Stable, continue Omeprazole.   Atrial fibrillation (HCC) Heart rate is in control, continue Diltiazem, Xarelto  Edema of left lower extremity due to peripheral venous insufficiency Minimal, TED, continue Metolazone, Echo EF normal in the past.    Family/ staff Communication: plan of care reviewed with the patient and charge nurse.   Labs/tests ordered: none  Time spend 25 minutes.

## 2019-06-22 NOTE — Assessment & Plan Note (Signed)
No pain today, continue daily Methocarbamol, prn Tylenol.

## 2019-06-22 NOTE — Assessment & Plan Note (Signed)
Tremors in hands are not apparent, able to write, ADLs, continue Sinemet.

## 2019-06-22 NOTE — Assessment & Plan Note (Signed)
Improving in urinary frequency and leakage, continue Mirabegron.

## 2019-06-22 NOTE — Assessment & Plan Note (Signed)
BLE weakness, w/c for mobility.

## 2019-06-22 NOTE — Assessment & Plan Note (Signed)
Minimal, TED, continue Metolazone, Echo EF normal in the past.

## 2019-06-23 ENCOUNTER — Encounter: Payer: Self-pay | Admitting: Nurse Practitioner

## 2019-06-29 ENCOUNTER — Ambulatory Visit: Payer: Medicare PPO | Admitting: Neurology

## 2019-07-18 ENCOUNTER — Ambulatory Visit: Payer: Medicare PPO | Admitting: Neurology

## 2019-07-18 ENCOUNTER — Other Ambulatory Visit: Payer: Self-pay

## 2019-07-18 ENCOUNTER — Encounter: Payer: Self-pay | Admitting: Neurology

## 2019-07-18 VITALS — BP 106/62 | HR 59

## 2019-07-18 DIAGNOSIS — G20 Parkinson's disease: Secondary | ICD-10-CM | POA: Diagnosis not present

## 2019-07-18 NOTE — Patient Instructions (Signed)
As discussed, we will continue with your medication regimen including your short acting Sinemet 4 times a day, 2 pills each time, as well as your long-acting Sinemet at night.  Please monitor your bowel habits, if you have loose stools or diarrhea, please let your nurse or primary care provider know.  You do take Senokot and MiraLAX daily.  Please try to hydrate well with water.  Please follow-up in 4 months to see one of our nurse practitioners, you had seen Amy before.

## 2019-07-18 NOTE — Progress Notes (Signed)
Subjective:    Patient ID: Stephanie Shaw is a 79 y.o. female.  HPI     Interim history:  Stephanie Shaw is a 42 year old right-handed woman with an underlying medical history of paroxysmal A. fib, osteoarthritis, chronic venous insufficiency and Dupuytren's contracture, who presents for follow-up consultation of her Parkinson's disease. The patient is unaccompanied today and came via transportation from SNF. I first met her on 01/12/2019 at the request of her primary care physician, at which time she reported a prior diagnosis of Parkinson's disease. She was advised to continue her medication regimen.   She saw Debbora Presto, nurse practitioner in the interim on 05/16/2019, at which time her blood pressure was notably low. She was advised to decrease Sinemet IR to 1-1/2 pills 4 times a day and add a morning dose of Sinemet CR in addition to her evening dose. She preferred to continue her current regimen.  The patient's allergies, current medications, family history, past medical history, past social history, past surgical history and problem list were reviewed and updated as appropriate.   Today, 07/18/2019: She reports that she feels overall weaker.  Her medication takes about an hour to kick in.  She takes Sinemet IR 2 pills 4 times a day, at 630, 1030, 230 and 630 and Sinemet CR at bedtime.  She reports that her appetite is good, she tries to hydrate with water.  She has not fallen thankfully.  She uses the [place] with assistance of a CNA and a gait belt about 3 times a day when she goes to the dining hall.  She reports that her stool has been looser.  She takes Senokot and MiraLAX daily.  No recent issues with constipation.  Previously:   (She) was diagnosed with Parkinson's disease in 2018.  She believes she has had symptoms for several years, does recall that she had left-sided symptoms in the beginning.  Over time, she has had more stiffness, decline in mobility, constipation, low blood pressure  issues, and has fallen.  [facility].  She fell in the summer and feels that she had a more steeper decline since the summer. She has been followed by Dr. Carles Collet at [facility] neurology, had a visit on 09/24/2018.  She was on Sinemet 1.5 pills 4 times a day, 25-100 mg strength.  She was hospitalized after several falls in August 2020.  She was diagnosed with rhabdomyolysis.  She went to rehab after that.  She had an abnormal DaTscan on 09/25/2016 and I reviewed the results: IMPRESSION: Poor uptake of dopamine transport specific radiotracer within the LEFT and RIGHT putamen and decrease relative uptake in the head of the RIGHT caudate nucleus is in pattern typical of Parkinson's syndrome.   She also had a telemedicine visit with Dr. Carles Collet on 12/23/2018 which I reviewed: Her Sinemet was changed to 2 pills 4 times a day and she was advised to start Sinemet CR 50-200 mg strength at bedtime.  She was advised to follow-up with her primary care physician regarding anxiety and depression symptoms and to follow-up with urology regarding her urinary incontinence.   She reports that she has an appointment with urology.  She reports constipation and indicates that she has a bowel movement typically every other day, sometimes every third day.  In reviewing her MAR, I do not see that she has any as needed medication for stool softener or laxative.  She denies any hallucinations, she does have difficulty maintaining sleep.  She indicates nocturia about once or twice per average night which is better since the summer.  She also was treated for a UTI in the summer.  She reports no significant difficulty falling asleep but she does wake up in the middle of the night and has trouble going back to sleep.  She feels that the increase in the Sinemet and the addition of the long-acting Sinemet has been helpful.  She has physical therapy at her skilled nursing facility.  She usually is able to walk with her  [place], she indicates that she has a 2 wheeled [place] which she did not bring today, she is in a wheelchair.  She denies a family history of Parkinson's disease with the exception of one paternal cousin that had Parkinson's disease.  Her Past Medical History Is Significant For: Past Medical History:  Diagnosis Date   Acute lower UTI 09/14/2018   Anemia 10/15/2018   2016 colonoscopy 10/14/18 wbc 5.4, Hgb 10.1, plt 184 12/01/18 wbc 4.5, Hgb 11.1, plt 188, neutrophils 63,  Na 139, K 3.7, Bun 23, creat 0.69, eGFR 83 on Fe, Hgb 11.7 12/09/18    Atrial fibrillation ([place]) 05/21/2010   10/07/18 Na 143, K 4.0, Bun 20, creat 0.79, eGFR 72, wbc 7.1, Hgb 10.9, plt 172 10/28/18 Na 142, K 4.0, Bun 19, creat 0.77, eGFR 74 11/18/18 Na 144, K 4.1, Bun 21, creat 0.69, eGFR 83   Bilateral lower extremity edema 07/19/2018   11/02/18 BMP 2 weeks.    Cervical pain (neck) 06/20/2010   Closed fracture of part of upper end of humerus 05/01/2015   Colles' fracture of right radius 03/05/2015   Constipation 07/19/2018   DEGENERATIVE JOINT DISEASE, RIGHT HIP 02/16/2007   Dupuytren's contracture    Dysuria 09/20/2018   09/20/18 c/o got up several times last night to go urinate, burning on urination, lower abd /back discomfort, but urinary frequency, leakage are not new. UA C/S, Pyridium 117m tid x 2 days.  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   PREMATURE ATRIAL CONTRACTIONS 02/16/2007   Primary osteoarthritis of right shoulder 04/27/2017   S/P breast biopsy, left    two o'clock position - benign   Toxic effect of venom(989.5) 07/27/2007   Tremor,  unspecified 10/19/2015   Unstable gait 02/16/2017   Vaginal atrophy 10/19/2015   Venous insufficiency    Weakness 09/12/2018    Her Past Surgical History Is Significant For: Past Surgical History:  Procedure Laterality Date   BREAST BIOPSY Left    CATARACT EXTRACTION, BILATERAL      Her Family History Is Significant For: Family History  Problem Relation Age of Onset   Cancer Father        ? type   Alcohol abuse Father    Cancer Mother        Pancreatic   Diabetes Mother    Heart failure Mother    Osteoarthritis Sister    Healthy Sister    Healthy Child    Breast cancer Neg Hx     Her Social History Is Significant For: Social History   Socioeconomic History  Marital status: Divorced    Spouse name: Not on file   Number of children: 2   Years of education: Not on file   Highest education level: Master's degree (e.g., MA, MS, MEng, MEd, MSW, MBA)  Occupational History   Occupation: retired    Comment: math, [employer]  Tobacco Use   Smoking status: Former Smoker    Quit date: 08/27/1971    Years since quitting: 47.9   Smokeless tobacco: Never Used  Vaping Use   Vaping Use: Never used  Substance and Sexual Activity   Alcohol use: No    Alcohol/week: 0.0 standard drinks   Drug use: No   Sexual activity: Not Currently    Comment: 1st intercourse 79 yo-Fewer than 5 partners  Other Topics Concern   Not on file  Social History Narrative   Not on file   Social Determinants of Health   Financial Resource Strain:    Difficulty of Paying Living Expenses:   Food Insecurity:    Worried About Charity fundraiser in the Last Year:    Arboriculturist in the Last Year:   Transportation Needs:    Film/video editor (Medical):    Lack of Transportation (Non-Medical):   Physical Activity:    Days of Exercise per Week:    Minutes of Exercise per Session:   Stress:    Feeling of Stress :   Social Connections:    Frequency of  Communication with Friends and Family:    Frequency of Social Gatherings with Friends and Family:    Attends Religious Services:    Active Member of Clubs or Organizations:    Attends Archivist Meetings:    Marital Status:     Her Allergies Are:  Allergies  Allergen Reactions   Doxycycline Nausea And Vomiting   Sulfonamide Derivatives     REACTION: HIVES  :   Her Current Medications Are:  Outpatient Encounter Medications as of 07/18/2019  Medication Sig   acetaminophen (TYLENOL) 500 MG tablet Take 1,000 mg by mouth 3 (three) times daily as needed.    calcium carbonate (TUMS EX) 750 MG chewable tablet Chew 1 tablet by mouth daily. Between the hours of 7am-10am.   carbidopa-levodopa (SINEMET CR) 50-200 MG tablet Take 1 tablet by mouth at bedtime. 9pm   carbidopa-levodopa (SINEMET IR) 25-100 MG tablet Take 2 tablets by mouth 4 (four) times daily. Morning: 6:30 am, MidDay 10:30 am, Afternoon 2:30 pm, Evening 6:30 pm .   Cholecalciferol (VITAMIN D3) 10 MCG (400 UNIT) CAPS Take 1 capsule by mouth daily. Between the hours of 7am-10am.   diltiazem (CARDIZEM CD) 120 MG 24 hr capsule Take 120 mg by mouth daily. Between the hours of 7am-10am. HOLD IF SBP < 100   ferrous sulfate 325 (65 FE) MG tablet Take 325 mg by mouth every Monday, Wednesday, and Friday. Give with food   hydrocortisone 2.5 % ointment Apply 1 application topically at bedtime. At bedtime for 1 week then change to PRN.   methocarbamol (ROBAXIN) 500 MG tablet Take 250 mg by mouth daily. As needed   metolazone (ZAROXOLYN) 2.5 MG tablet Take 2.5 mg by mouth every Monday, Wednesday, and Friday. Hold if systolic blood pressure less than 100   mirabegron ER (MYRBETRIQ) 50 MG TB24 tablet Take 50 mg by mouth daily. Between the hours of 7am-10am.   omeprazole (PRILOSEC) 20 MG capsule Take 20 mg by mouth daily.   polyethylene glycol (MIRALAX / GLYCOLAX) 17 g packet  Take 17 g by mouth daily.   potassium  chloride SA (KLOR-CON) 20 MEQ tablet Take 20 mEq by mouth daily.   rivaroxaban (XARELTO) 20 MG TABS tablet Take 20 mg by mouth every evening. Between the hours of 5pm-6pm.   sennosides-docusate sodium (SENOKOT-S) 8.6-50 MG tablet Take 1 tablet by mouth at bedtime. Between the hours of 7pm-10pm.   Sodium Fluoride (PREVIDENT 5000 BOOSTER PLUS) 1.1 % PSTE Place 1 application onto teeth in the morning and at bedtime.   zinc oxide 20 % ointment Apply 1 application topically as needed for irritation. To buttocks after every incontinent episode and as needed for redness. May keep at bedside.   No facility-administered encounter medications on file as of 07/18/2019.  :  Review of Systems:  Out of a complete 14 point review of systems, all are reviewed and negative with the exception of these symptoms as listed below: Review of Systems  Neurological:       Here for 2 month f/u. Pt reports since her last visit her writing has decreased along with increased muscle spasms across her back, neck, shoulder and legs. Pt reports as her meds wear off her weakness worsening and take her an hour after taking her meds to feel back to baseline.     Objective:  Neurological Exam  Physical Exam Physical Examination:   Vitals:   07/18/19 1354  BP: 106/62  Pulse: (!) 59  SpO2: 98%    General Examination: The patient is a very pleasant 79 y.o. female in no acute distress. She appears frail, situated in her wheelchair.  Well-groomed.   HEENT: Normocephalic, atraumatic, pupils are equal, round and reactive to light, extraocular tracking shows saccadic breakdown, moderate facial masking, has some excess moisture around the corner of the right mouth, no frank drooling.  She has significant hypophonia and mild to moderate dysarthria, at times she is difficult to understand.  She has moderate to severe nuchal rigidity.   Chest: is clear to auscultation without wheezing, rhonchi or crackles noted.  Heart:  irregularly irregular.   Abdomen: is soft, non-tender and non-distended with normal bowel sounds appreciated on auscultation.  Extremities: There is 1-2+ pitting edema in the distal lower extremities. She is wearing any compression socks or stockings.  Skin: is warm and dry with no trophic changes noted. Age-related changes are noted on the skin.   Musculoskeletal: exam reveals Arthritic changes in both hands.   Neurologically:  Mental status: The patient is awake and alert, paying good  attention. She is Able to provide her history and relate her symptoms fairly well. Her memory appears to be at least mildly impaired. She has no obvious mood related issues, does not appear to be overly anxious. She has no obvious hallucinations. Some degree of bradyphrenia noted, somewhat slow to respond at times.  Cranial nerves are as described above under HEENT exam.  (On 01/12/2019: On Archimedes spiral drawing, she has insecurity with both the right and the left hand, left hand a little worse which is her nondominant hand.)  She has no obvious resting tremor, no obvious action or postural tremor, no intention tremor today.  She has moderate to severe difficulty with fine motor skills, more noticeable on the left.  She is in the wheelchair, I did not have her stand or walk for me today.   Tone is increased in the extremities.  Strength is overall 4 out of 5. Cerebellar testing shows no dysmetria or intention tremor.   Sensory exam is  intact to light touch in the upper and lower extremities.   Assessment and Plan:   In summary, Stephanie Shaw is a very pleasant 79 year old female with an underlying medical history of paroxysmal A. fib, osteoarthritis, chronic venous insufficiency and Dupuytren's contracture, who presents for follow-up consultation of her Parkinson's disease.  Her history and examination are in keeping with left-sided predominant Parkinson's disease.  She has advanced findings.   She continues to take Sinemet generic 25-100 mg strength immediate release 2 pills 4 times daily and Sinemet CR generic 50-200 mg strength at bedtime.  She has had some lower blood pressure values.  Currently on the low end of normal.  She is encouraged to continue with her medication regimen.  She has fallen in the past, she continues to be at fall risk.  She walks with assistance of a gait belt one person assist with the help of her [place]le[place], goes to the dining hall about 3 times a day.  Lately, she has had some more looser stools, she has had trouble with constipation in the past.  She is advised to monitor her bowel habits and let her nurse and primary care know, maybe she needs to have a dose reduction in her MiraLAX and/or Senokot. Of note, she had seen Dr. Carles Collet in the past; she had a DaTscan in 2018 which was supportive of her diagnosis.  She is advised to follow-up with one of our nurse practitioners in 4 months and continue with the current medication regimen. I answered all her questions today and the patient was in agreement with the plan. I spent 20 minutes in total face-to-face time and in reviewing records during pre-charting, more than 50% of which was spent in counseling and coordination of care, reviewing test results, reviewing medications and treatment regimen and/or in discussing or reviewing the diagnosis of PD, the prognosis and treatment options. Pertinent laboratory and imaging test results that were available during this visit with the patient were reviewed by me and considered in my medical decision making (see chart for details).

## 2019-07-19 DIAGNOSIS — I10 Essential (primary) hypertension: Secondary | ICD-10-CM | POA: Diagnosis not present

## 2019-07-19 DIAGNOSIS — I482 Chronic atrial fibrillation, unspecified: Secondary | ICD-10-CM | POA: Diagnosis not present

## 2019-07-19 LAB — COMPREHENSIVE METABOLIC PANEL: Calcium: 8.9 (ref 8.7–10.7)

## 2019-07-19 LAB — BASIC METABOLIC PANEL
BUN: 20 (ref 4–21)
CO2: 29 — AB (ref 13–22)
Chloride: 104 (ref 99–108)
Creatinine: 0.7 (ref 0.5–1.1)
Glucose: 90
Potassium: 4.1 (ref 3.4–5.3)
Sodium: 138 (ref 137–147)

## 2019-08-04 ENCOUNTER — Non-Acute Institutional Stay (SKILLED_NURSING_FACILITY): Payer: Medicare PPO | Admitting: Nurse Practitioner

## 2019-08-04 ENCOUNTER — Encounter: Payer: Self-pay | Admitting: Nurse Practitioner

## 2019-08-04 DIAGNOSIS — I4819 Other persistent atrial fibrillation: Secondary | ICD-10-CM

## 2019-08-04 DIAGNOSIS — N3281 Overactive bladder: Secondary | ICD-10-CM

## 2019-08-04 DIAGNOSIS — D649 Anemia, unspecified: Secondary | ICD-10-CM | POA: Diagnosis not present

## 2019-08-04 DIAGNOSIS — K5900 Constipation, unspecified: Secondary | ICD-10-CM

## 2019-08-04 DIAGNOSIS — R600 Localized edema: Secondary | ICD-10-CM

## 2019-08-04 DIAGNOSIS — G20 Parkinson's disease: Secondary | ICD-10-CM

## 2019-08-04 DIAGNOSIS — I872 Venous insufficiency (chronic) (peripheral): Secondary | ICD-10-CM | POA: Diagnosis not present

## 2019-08-04 DIAGNOSIS — K219 Gastro-esophageal reflux disease without esophagitis: Secondary | ICD-10-CM

## 2019-08-04 DIAGNOSIS — M159 Polyosteoarthritis, unspecified: Secondary | ICD-10-CM

## 2019-08-04 NOTE — Progress Notes (Signed)
Location:   [facility] Room Number: 13 Place of Service:  SNF (31) Provider:  Marda Stalker, Lennie Odor NP   [name], MD  Patient Care Team: [name], MD as PCP - General (Internal Medicine) [name], [name], MD as PCP - Cardiology (Cardiology) [name], [name], DO as Consulting Physician (Neurology) Shawntavia Saunders X, NP as Nurse Practitioner (Internal Medicine)  Extended Emergency Contact Information Primary Emergency Contact: Tenee, Wish Mobile Phone: [phone] Relation: Daughter Interpreter needed? No Secondary Emergency Contact: [facility] Address: [address]          [place], [place] [address] [name] of [place] Phone: [phone] Mobile Phone: [phone] Relation: Son  Code Status:  Managed Care Goals of care: Advanced Directive information Advanced Directives 06/22/2019  Does Patient Have a Medical Advance Directive? Yes  Type of Advance Directive [place]  Does patient want to make changes to medical advance directive? No - Patient declined  Copy of [place] in Chart? Yes - validated most recent copy scanned in chart (See row information)  Would patient like information on creating a medical advance directive? -  Pre-existing out of facility DNR order (yellow form or pink MOST form) -     Chief Complaint  Patient presents with  . Medical Management of Chronic Issues    HPI:  Pt is a 79 y.o. female seen today for medical management of chronic diseases.   Constipation, stable, on MiraLax qd, Senokot S I qd.   Urinary frequency, bathroom trips 0-1/night, on Mirabegron.   PVD/trace edema BLE, on Metolazone 2.64m 3x/wk.   Afib, heart rate is in control, on Xarelto 28mqd, Diltiazem 12019md.   R hip pain,  on Tylenol 1000m79md prn, Methocarbamol 250mg42m  Parkinson's stable, f/u neurology, last 07/18/19, on Sinemet.    Anemia, stable, Hgb 11s,  takes Fe  GERD, stable, on  Omeprazole 20mg 58m                Past Medical History:  Diagnosis Date  . Acute lower UTI 09/14/2018  . Anemia 10/15/2018   2016 colonoscopy 10/14/18 wbc 5.4, Hgb 10.1, plt 184 12/01/18 wbc 4.5, Hgb 11.1, plt 188, neutrophils 63,  Na 139, K 3.7, Bun 23, creat 0.69, eGFR 83 on Fe, Hgb 11.7 12/09/18   . Atrial fibrillation (HCC) [place]   10/07/18 Na 143, K 4.0, Bun 20, creat 0.79, eGFR 72, wbc 7.1, Hgb 10.9, plt 172 10/28/18 Na 142, K 4.0, Bun 19, creat 0.77, eGFR 74 11/18/18 Na 144, K 4.1, Bun 21, creat 0.69, eGFR 83  . Bilateral lower extremity edema 07/19/2018   11/02/18 BMP 2 weeks.   . Cervical pain (neck) 06/20/2010  . Closed fracture of part of upper end of humerus 05/01/2015  . Colles' fracture of right radius 03/05/2015  . Constipation 07/19/2018  . DEGENERATIVE JOINT DISEASE, RIGHT HIP 02/16/2007  . Dupuytren's contracture   . Dysuria 09/20/2018   09/20/18 c/o got up several times last night to go urinate, burning on urination, lower abd /back discomfort, but urinary frequency, leakage are not new. UA C/S, Pyridium 100mg t72m 2 days.  09/21/18 wbc 6.1, Hgb 11.8, plt 210, neutrophil 69.4, Na 142, K 4.1, Bun 19, creat 0.78, TP 6.4, albumin 3.9  . Hematuria 02/04/2014  . Hypotension 09/18/2018  . Long term (current) use of anticoagulants 05/29/2016  . Osteoarthritis of hip    right  . Osteoarthritis of left  knee   . Overactive bladder 10/06/2018  . Parkinson disease ([place])   . Paroxysmal A-fib ([place])   . POSTMENOPAUSAL SYNDROME 02/16/2007  . PREMATURE ATRIAL CONTRACTIONS 02/16/2007  . 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 S/P breast biopsy, left    two o'clock position - benign  . 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 Tremor, unspecified 10/19/2015  . Unstable gait 02/16/2017  . Vaginal atrophy 10/19/2015  . Venous insufficiency   . Weakness 09/12/2018   Past Surgical History:  Procedure Laterality Date  . BREAST BIOPSY Left   . CATARACT EXTRACTION, BILATERAL       Allergies  Allergen Reactions  . Doxycycline Nausea And Vomiting  . Sulfonamide Derivatives     REACTION: HIVES    Allergies as of 08/04/2019      Reactions   Doxycycline Nausea And Vomiting   Sulfonamide Derivatives    REACTION: HIVES      Medication List       Accurate as of August 04, 2019 11:59 PM. If you have any questions, ask your nurse or doctor.        acetaminophen 500 MG tablet Commonly known as: TYLENOL Take 1,000 mg by mouth 3 (three) times daily as needed.   calcium carbonate 750 MG chewable tablet Commonly known as: TUMS EX Chew 1 tablet by mouth daily. Between the hours of 7am-10am.   carbidopa-levodopa 25-100 MG tablet Commonly known as: SINEMET IR Take 2 tablets by mouth 4 (four) times daily. Morning: 6:30 am, MidDay 10:30 am, Afternoon 2:30 pm, Evening 6:30 pm .   carbidopa-levodopa 50-200 MG tablet Commonly known as: SINEMET CR Take 1 tablet by mouth at bedtime. 9pm   diltiazem 120 MG 24 hr capsule Commonly known as: CARDIZEM CD Take 120 mg by mouth daily. Between the hours of 7am-10am. HOLD IF SBP < 100   ferrous sulfate 325 (65 FE) MG tablet Take 325 mg by mouth every Monday, Wednesday, and Friday. Give with food   hydrocortisone 2.5 % ointment Apply 1 application topically at bedtime. At bedtime for 1 week then change to PRN.   methocarbamol 500 MG tablet Commonly known as: ROBAXIN Take 250 mg by mouth daily. As needed   metolazone 2.5 MG tablet Commonly known as: ZAROXOLYN Take 2.5 mg by mouth every Monday, Wednesday, and Friday. Hold if systolic blood pressure less than 100   Myrbetriq 50 MG Tb24 tablet Generic drug: mirabegron ER Take 50 mg by mouth daily. Between the hours of 7am-10am.   omeprazole 20 MG capsule Commonly known as: PRILOSEC Take 20 mg by mouth daily.   polyethylene glycol 17 g packet Commonly known as: MIRALAX / GLYCOLAX Take 17 g by mouth daily.   potassium chloride SA 20 MEQ tablet Commonly known as:  KLOR-CON Take 20 mEq by mouth daily.   PreviDent 5000 Booster Plus 1.1 % Pste Generic drug: Sodium Fluoride Place 1 application onto teeth in the morning and at bedtime.   rivaroxaban 20 MG Tabs tablet Commonly known as: XARELTO Take 20 mg by mouth every evening. Between the hours of 5pm-6pm.   sennosides-docusate sodium 8.6-50 MG tablet Commonly known as: SENOKOT-S Take 1 tablet by mouth at bedtime. Between the hours of 7pm-10pm.   Vitamin D3 10 MCG (400 UNIT) Caps Take 1 capsule by mouth daily. Between the hours of 7am-10am.   zinc oxide 20 % ointment Apply 1 application topically as needed for irritation. To buttocks after every incontinent episode and as needed for redness.  May keep at bedside.       Review of Systems  Constitutional: Negative for activity change, appetite change, fever and unexpected weight change.  HENT: Positive for hearing loss. Negative for congestion and voice change.   Eyes: Negative for visual disturbance.  Respiratory: Negative for cough.   Cardiovascular: Positive for leg swelling.  Gastrointestinal: Negative for abdominal pain and constipation.  Genitourinary: Negative for dysuria, frequency and urgency.  Musculoskeletal: Positive for arthralgias and gait problem. Negative for myalgias.       Right hip pain is well controlled.   Skin: Negative for color change.  Neurological: Positive for tremors. Negative for dizziness, speech difficulty, numbness and headaches.       Moves slow, fine tremor in fingers, burning sensation  in the R+ L great toes when touched by sheets, comes/goes  Psychiatric/Behavioral: Negative for agitation, behavioral problems, hallucinations and sleep disturbance. The patient is not nervous/anxious.     Immunization History  Administered Date(s) Administered  . Influenza Split 10/20/2012  . Influenza Whole 10/21/2006  . Influenza, High Dose Seasonal PF 10/17/2016  . Influenza-Unspecified 10/20/2013, 10/22/2017  .  Moderna SARS-COVID-2 Vaccination 01/22/2019, 02/19/2019  . Pneumococcal Conjugate-13 01/31/2013  . Pneumococcal Polysaccharide-23 01/20/2005, 11/24/2011  . Td 01/21/2004  . Tdap 03/16/2014  . Zoster 04/17/2009   Pertinent  Health Maintenance Due  Topic Date Due  . COLONOSCOPY  06/12/2019  . INFLUENZA VACCINE  08/21/2019  . DEXA SCAN  Completed  . PNA vac Low Risk Adult  Completed   Fall Risk  05/03/2018 11/03/2017 08/04/2017 03/06/2017 11/26/2016  Falls in the past year? 1 No No Yes Yes  Number falls in past yr: 0 - - 2 or more 2 or more  Injury with Fall? 0 - - No No  Risk Factor Category  - - - High Fall Risk High Fall Risk  Risk for fall due to : Other (Comment) - - - -  Follow up Falls evaluation completed;Education provided;Falls prevention discussed - - Falls evaluation completed Falls evaluation completed   Functional Status Survey:    Vitals:   08/04/19 1619  BP: 110/64  Pulse: 70  Resp: 18  Temp: (!) 97.5 F (36.4 C)  SpO2: 94%  Weight: 137 lb 9.6 oz (62.4 kg)  Height: '5\' 8"'  (1.727 m)   Body mass index is 20.92 kg/m. Physical Exam Vitals and nursing note reviewed.  Constitutional:      Appearance: Normal appearance.  HENT:     Head: Normocephalic and atraumatic.     Mouth/Throat:     Mouth: Mucous membranes are moist.  Eyes:     Extraocular Movements: Extraocular movements intact.     Conjunctiva/sclera: Conjunctivae normal.     Pupils: Pupils are equal, round, and reactive to light.  Cardiovascular:     Rate and Rhythm: Normal rate. Rhythm irregular.     Heart sounds: No murmur heard.      Comments: DP pulses present L>R Pulmonary:     Breath sounds: No rales.  Abdominal:     General: There is no distension.     Palpations: Abdomen is soft.     Tenderness: There is no abdominal tenderness.  Musculoskeletal:     Cervical back: Normal range of motion and neck supple.     Right lower leg: Edema present.     Left lower leg: Edema present.      Comments: trace edema BLE.   Skin:    General: Skin is warm and dry.  Comments: A skin lesion at the lateral left lower leg-pending dermatology appointment  Neurological:     General: No focal deficit present.     Mental Status: She is alert and oriented to person, place, and time. Mental status is at baseline.     Motor: No weakness.     Coordination: Coordination abnormal.     Gait: Gait abnormal.     Comments: Moves slow, tremor in hands  Psychiatric:        Mood and Affect: Mood normal.        Behavior: Behavior normal.        Thought Content: Thought content normal.     Labs reviewed: Recent Labs    09/12/18 0304 09/12/18 0304 09/13/18 0201 09/13/18 0201 09/14/18 0043 09/21/18 0000 04/01/19 0000 05/17/19 0000 07/19/19 0000  NA 140   < > 140   < > 140   < > 140 140 138  K 3.6   < > 3.8   < > 3.9   < > 4.8 3.6 4.1  CL 108   < > 112*   < > 111   < > 104 103 104  CO2 21*   < > 22   < > 23   < > 31* 31* 29*  GLUCOSE 89  --  95  --  109*  --   --   --   --   BUN 27*   < > 24*   < > 15   < > 33* 22* 20  CREATININE 0.88   < > 0.75   < > 0.68   < > 1.0 0.7 0.7  CALCIUM 9.2   < > 8.4*   < > 8.5*   < > 9.3 9.4 8.9  MG  --   --  2.0  --   --   --   --   --   --    < > = values in this interval not displayed.   Recent Labs    09/12/18 0304 09/12/18 0304 09/13/18 0201 09/13/18 0201 09/21/18 0000 12/09/18 0000 03/24/19 0000 04/01/19 0000  AST 60*   < > 50*   < > 20 16 11* 12*  ALT 7   < > 7   < > 20 10 5*  --   ALKPHOS 45   < > 36*   < > 42 47 50 48  BILITOT 1.1  --  0.6  --   --   --   --   --   PROT 6.2*  --  4.9*  --  6.4  --   --   --   ALBUMIN 3.7   < > 2.8*   < > 3.9 3.8 4.0 3.8   < > = values in this interval not displayed.   Recent Labs    09/11/18 2105 09/11/18 2105 09/12/18 0304 09/12/18 0304 09/13/18 0201 09/21/18 0000 12/09/18 0000 12/09/18 0000 03/24/19 0000 04/01/19 0000 04/07/19 0000 04/14/19 0000 05/17/19 0000  WBC 10.6*   < > 8.0   <  > 8.9   < > 5.5   < > 8.4   < > 6.6 5.9 5.2  NEUTROABS 9.4*   < >  --   --  7.1  --  3,718  --  6,602  --   --   --   --   HGB 12.6   < > 11.5*   < > 10.5*   < > 11.7*   < >  10.9*   < > 9.3* 9.7* 11.4*  HCT 40.1   < > 35.2*   < > 32.4*   < > 37   < > 34*   < > 29* 30* 34*  MCV 96.4  --  94.4  --  93.9  --   --   --   --   --   --   --   --   PLT 170   < > 150   < > 125*   < > 189   < > 190   < > 199 192 184   < > = values in this interval not displayed.   Lab Results  Component Value Date   TSH 0.408 09/11/2018   No results found for: HGBA1C Lab Results  Component Value Date   CHOL 185 03/24/2019   HDL 79 (A) 03/24/2019   LDLCALC 94 03/24/2019   LDLDIRECT 104.2 01/25/2013   TRIG 41 03/24/2019   CHOLHDL 2 08/29/2015    Significant Diagnostic Results in last 30 days:  No results found.  Assessment/Plan Atrial fibrillation (HCC) Heart rate is in control, continue Diltiazem, Xarelto, pending Cardiology f/u next Wed  Edema of left lower extremity due to peripheral venous insufficiency Minimal, continue Metolazone.   GERD (gastroesophageal reflux disease) Stable, continue Omeprazole.   Parkinsonism ([place]) W/c dependent, minimal tremor in hands, continue Sinemet, f/u neurology.   Osteoarthritis involving multiple joints on both sides of body R hip pain, improved,  Continue Tylenol 1013m tid prn, Methocarbamol 2527mqd.  Overactive bladder Improved, continue Myrbetriq.   Anemia Hgb 11.4 05/17/19, continue Fe  Bilateral lower extremity edema Minimal, continue Metolazone.   Constipation Stable, continue MiraLax, dc Senokot S due to refusal.    Family/ staff Communication: plan of care reviewed with the patient and charge nurse.   Labs/tests ordered:  none  Time spend 25 minutes.

## 2019-08-05 ENCOUNTER — Encounter: Payer: Self-pay | Admitting: Nurse Practitioner

## 2019-08-05 NOTE — Assessment & Plan Note (Addendum)
Stable, continue MiraLax, dc Senokot S due to refusal.

## 2019-08-05 NOTE — Assessment & Plan Note (Signed)
Minimal, continue Metolazone.

## 2019-08-05 NOTE — Assessment & Plan Note (Signed)
W/c dependent, minimal tremor in hands, continue Sinemet, f/u neurology.

## 2019-08-05 NOTE — Assessment & Plan Note (Addendum)
Heart rate is in control, continue Diltiazem, Xarelto, pending Cardiology f/u next Wed

## 2019-08-05 NOTE — Assessment & Plan Note (Signed)
Stable, continue Omeprazole.  

## 2019-08-05 NOTE — Assessment & Plan Note (Signed)
R hip pain, improved,  Continue Tylenol 1000mg  tid prn, Methocarbamol 250mg  qd.

## 2019-08-05 NOTE — Assessment & Plan Note (Signed)
Hgb 11.4 05/17/19, continue Fe

## 2019-08-05 NOTE — Assessment & Plan Note (Signed)
Improved, continue Myrbetriq.

## 2019-08-08 NOTE — Progress Notes (Signed)
CARDIOLOGY OFFICE NOTE  Date:  08/09/2019    Stephanie Shaw Date of Birth: 1940/08/26 Medical Record #[id number]  PCP:  [name], MD  Cardiologist:  Aundra Dubin  -- now [place]   Chief Complaint  Patient presents with  . Atrial Fibrillation   Problem List 1. Atrial fib 2. Essential tremor    Previous notes from [facility]:   Stephanie Shaw is a 79 y.o. female who presents today for a one year check.     She has a history of chronic atrial fibrillation. Patient has had prior history of PACs, but atrial fibrillation was noted for the first time on 4/12. She seems to have remained in atrial fibrillation since that time.  She is now taking diltiazem CD and Xarelto. She teaches at [employer].  Seen a year ago and was felt to be doing ok.  Comes back today. Here alone. She continues to do ok. She has no real awareness of her atrial fib. She has had 2 falls earlier this year - tripped both time - broke an arm each time. Will be getting bone scan set up. Just now really getting back to her baseline. She says she clearly tripped. No passing out. No chest pain. Not short of breath. Needs her Xarelto refilled.   May 29, 2016:  Seen for the first time today  Transfer from [place]. Hx of persistent atrial fib.   Asymptomatic  Still eats salt  Has had some leg swelling   Jun 02, 2017:  Has been diagnosed with Parkinsons diease last fall Has been having low BP readings. ( on Sinamet)  Occasional episode of dizziness.   She asked about being on Xarelto - with her increased risk of falling.   02/10/2019: Stephanie Shaw seen today for follow-up of her atrial fibrillation.  She has Parkinson's disease. After I saw her by telemedicine in 2020 due to Covid.  Seen with  About the same No cp , no dyspnea Basically in the wheelchair.  Tries to walk with a Sugar at times.  Has progressive parkinsons.  PMH:  1. Atrial fibrillation: First noted on 05/13/10. Had PACs noted prior to  this. Persistent. Echo (5/12): EF 55%, normal wall thickness, normal valves, moderate biatrial enlargement, normal RV.  2. Duputren's contractures 3. Osteoarthritis 4. Venous insufficiency 5. Multinodular goiter: Nontoxic.   Past Medical History:  Diagnosis Date  . Acute lower UTI 09/14/2018  . Anemia 10/15/2018   2016 colonoscopy 10/14/18 wbc 5.4, Hgb 10.1, plt 184 12/01/18 wbc 4.5, Hgb 11.1, plt 188, neutrophils 63,  Na 139, K 3.7, Bun 23, creat 0.69, eGFR 83 on Fe, Hgb 11.7 12/09/18   . Atrial fibrillation ([place]) 05/21/2010   10/07/18 Na 143, K 4.0, Bun 20, creat 0.79, eGFR 72, wbc 7.1, Hgb 10.9, plt 172 10/28/18 Na 142, K 4.0, Bun 19, creat 0.77, eGFR 74 11/18/18 Na 144, K 4.1, Bun 21, creat 0.69, eGFR 83  . Bilateral lower extremity edema 07/19/2018   11/02/18 BMP 2 weeks.   . Cervical pain (neck) 06/20/2010  . Closed fracture of part of upper end of humerus 05/01/2015  . Colles' fracture of right radius 03/05/2015  . Constipation 07/19/2018  . DEGENERATIVE JOINT DISEASE, RIGHT HIP 02/16/2007  . Dupuytren's contracture   . Dysuria 09/20/2018   09/20/18 c/o got up several times last night to go urinate, burning on urination, lower abd /back discomfort, but urinary frequency, leakage are not new. UA C/S, Pyridium 158m tid x 2  days.  09/21/18 wbc 6.1, Hgb 11.8, plt 210, neutrophil 69.4, Na 142, K 4.1, Bun 19, creat 0.78, TP 6.4, albumin 3.9  . Hematuria 02/04/2014  . Hypotension 09/18/2018  . Long term (current) use of anticoagulants 05/29/2016  . Osteoarthritis of hip    right  . Osteoarthritis of left knee   . Overactive bladder 10/06/2018  . Parkinson disease ([place])   . Paroxysmal A-fib ([place])   . POSTMENOPAUSAL SYNDROME 02/16/2007  . PREMATURE ATRIAL CONTRACTIONS 02/16/2007  . 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 S/P breast biopsy, left    two o'clock position - benign  . 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 Tremor, unspecified 10/19/2015  . Unstable gait 02/16/2017  .  Vaginal atrophy 10/19/2015  . Venous insufficiency   . Weakness 09/12/2018    Past Surgical History:  Procedure Laterality Date  . BREAST BIOPSY Left   . CATARACT EXTRACTION, BILATERAL       Medications: Current Outpatient Medications  Medication Sig Dispense Refill  . acetaminophen (TYLENOL) 500 MG tablet Take 1,000 mg by mouth 3 (three) times daily as needed.     . calcium carbonate (TUMS EX) 750 MG chewable tablet Chew 1 tablet by mouth daily. Between the hours of 7am-10am.    . carbidopa-levodopa (SINEMET CR) 50-200 MG tablet Take 1 tablet by mouth at bedtime. 9pm    . carbidopa-levodopa (SINEMET IR) 25-100 MG tablet Take 2 tablets by mouth 4 (four) times daily. Morning: 6:30 am, MidDay 10:30 am, Afternoon 2:30 pm, Evening 6:30 pm .    . Cholecalciferol (VITAMIN D3) 10 MCG (400 UNIT) CAPS Take 1 capsule by mouth daily. Between the hours of 7am-10am.    . diltiazem (CARDIZEM CD) 120 MG 24 hr capsule Take 120 mg by mouth daily. Between the hours of 7am-10am. HOLD IF SBP < 100    . ferrous sulfate 325 (65 FE) MG tablet Take 325 mg by mouth every Monday, Wednesday, and Friday. Give with food    . hydrocortisone 2.5 % ointment Apply 1 application topically at bedtime. At bedtime for 1 week then change to PRN.    . methocarbamol (ROBAXIN) 500 MG tablet Take 250 mg by mouth daily. As needed    . metolazone (ZAROXOLYN) 2.5 MG tablet Take 2.5 mg by mouth every Monday, Wednesday, and Friday. Hold if systolic blood pressure less than 100    . mirabegron ER (MYRBETRIQ) 50 MG TB24 tablet Take 50 mg by mouth daily. Between the hours of 7am-10am.    . omeprazole (PRILOSEC) 20 MG capsule Take 20 mg by mouth daily.    . polyethylene glycol (MIRALAX / GLYCOLAX) 17 g packet Take 17 g by mouth daily.    . potassium chloride SA (KLOR-CON) 20 MEQ tablet Take 20 mEq by mouth daily.    . rivaroxaban (XARELTO) 20 MG TABS tablet Take 20 mg by mouth every evening. Between the hours of 5pm-6pm.    .  sennosides-docusate sodium (SENOKOT-S) 8.6-50 MG tablet Take 1 tablet by mouth at bedtime. Between the hours of 7pm-10pm.    . Sodium Fluoride (PREVIDENT 5000 BOOSTER PLUS) 1.1 % PSTE Place 1 application onto teeth in the morning and at bedtime.    Marland Kitchen zinc oxide 20 % ointment Apply 1 application topically as needed for irritation. To buttocks after every incontinent episode and as needed for redness. May keep at bedside.     No current facility-administered medications for this visit.    Allergies: Allergies  Allergen Reactions  .  Doxycycline Nausea And Vomiting  . Sulfonamide Derivatives     REACTION: HIVES    Social History: The patient  reports that she quit smoking about 47 years ago. She has never used smokeless tobacco. She reports that she does not drink alcohol and does not use drugs.   Family History: The patient's family history includes Alcohol abuse in her father; Cancer in her father and mother; Diabetes in her mother; Healthy in her child and sister; Heart failure in her mother; Osteoarthritis in her sister.   Review of Systems: Please see the history of present illness.   Otherwise, the review of systems is positive for none.   All other systems are reviewed and negative.    Physical Exam: Blood pressure 102/62, pulse 66, height 5' 8" (1.727 m), weight 136 lb (61.7 kg), SpO2 98 %.  GEN:   Elderly female, very frail.  Examined in the wheelchair HEENT: Normal NECK: No JVD; No carotid bruits LYMPHATICS: No lymphadenopathy CARDIAC: irreg. Irre.  RESPIRATORY:  Clear to auscultation without rales, wheezing or rhonchi  ABDOMEN: Soft, non-tender, non-distended MUSCULOSKELETAL:  1-2 + edema  SKIN: Warm and dry NEUROLOGIC:  Alert and oriented x 3    LABORATORY DATA:  EKG:        Lab Results  Component Value Date   WBC 5.2 05/17/2019   HGB 11.4 (A) 05/17/2019   HCT 34 (A) 05/17/2019   PLT 184 05/17/2019   GLUCOSE 109 (H) 09/14/2018   CHOL 185 03/24/2019   TRIG  41 03/24/2019   HDL 79 (A) 03/24/2019   LDLDIRECT 104.2 01/25/2013   LDLCALC 94 03/24/2019   ALT 5 (A) 03/24/2019   AST 12 (A) 04/01/2019   NA 138 07/19/2019   K 4.1 07/19/2019   CL 104 07/19/2019   CREATININE 0.7 07/19/2019   BUN 20 07/19/2019   CO2 29 (A) 07/19/2019   TSH 0.408 09/11/2018    BNP (last 3 results) No results for input(s): BNP in the last 8760 hours.  ProBNP (last 3 results) No results for input(s): PROBNP in the last 8760 hours.    Assessment/Plan:  Atrial fibrillation : Stephanie Shaw has stable and chronic atrial fibrillation.  She remains on Xarelto.  She is quite limited and has progressive deterioration due to her Parkinson's disease.  We have no plans for cardioversion or try to achieve normal sinus rhythm.  Goal is anticoagulation and rate control.  It seems to be more more difficult for her to get to her appointments.  I will be happy to see her in the future but it also might sense for her primary medical doctor and [facility] needs.  2.  Chronic diastolic congestive heart failure: She has a history of some diastolic dysfunction-likely related to atrial fibrillation.  She is on an unusual dose of diuretic-metolazone 3 days a week.  She is not on any furosemide or HCTZ.  It might make more sense to start her on a low-dose of HCTZ or perhaps furosemide than 3 days a week metolazone.  I will leave this decision up to her primary medical doctor.  She elevates her legs on a regular basis and seems to be controlling her leg edema fairly well.  3.  Parkinson's: She continues to have progressive Parkinson's disease.  Further management per her primary medical doctor.  At this point I will see her on an as-needed basis.  I am happy to see her if her primary medical  doctor does not want to manage the Xarelto. She may hold her Xarelto for 2 or 3 days prior to any invasive procedures if needed. Otherwise I would continue  her same medications except for the discussion regarding the diuretic above.      Current medicines are reviewed with the patient today.  The patient does not have concerns regarding medicines other than what has been noted above.  The following changes have been made:  See above.  Labs/ tests ordered today include:   No orders of the defined types were placed in this encounter.    Mertie Moores, MD  08/09/2019 5:40 PM    [address],  [address] [place], [place]  [address] Pager [phone] Phone: [phone]; Fax: [phone]

## 2019-08-09 ENCOUNTER — Ambulatory Visit (INDEPENDENT_AMBULATORY_CARE_PROVIDER_SITE_OTHER): Payer: Medicare PPO | Admitting: Cardiovascular Disease

## 2019-08-09 ENCOUNTER — Other Ambulatory Visit: Payer: Self-pay

## 2019-08-09 ENCOUNTER — Encounter: Payer: Self-pay | Admitting: Cardiovascular Disease

## 2019-08-09 VITALS — BP 102/62 | HR 66 | Ht 68.0 in | Wt 136.0 lb

## 2019-08-09 DIAGNOSIS — I4819 Other persistent atrial fibrillation: Secondary | ICD-10-CM | POA: Diagnosis not present

## 2019-08-09 NOTE — Patient Instructions (Signed)
Medication Instructions:  Your physician recommends that you continue on your current medications as directed. Please refer to the Current Medication list given to you today.  *If you need a refill on your cardiac medications before your next appointment, please call your pharmacy*   Lab Work: none If you have labs (blood work) drawn today and your tests are completely normal, you will receive your results only by: Marland Kitchen MyChart Message (if you have MyChart) OR . A paper copy in the mail If you have any lab test that is abnormal or we need to change your treatment, we will call you to review the results.   Testing/Procedures: none   Follow-Up: At [facility], you and your health needs are our priority.  As part of our continuing mission to provide you with exceptional heart care, we have created designated Provider Care Teams.  These Care Teams include your primary Cardiologist (physician) and Advanced Practice Providers (APPs -  Physician Assistants and Nurse Practitioners) who all work together to provide you with the care you need, when you need it.  We recommend signing up for the patient portal called "MyChart".  Sign up information is provided on this After Visit Summary.  MyChart is used to connect with patients for Virtual Visits (Telemedicine).  Patients are able to view lab/test results, encounter notes, upcoming appointments, etc.  Non-urgent messages can be sent to your provider as well.   To learn more about what you can do with MyChart, go to [email or web address].    Your next appointment:  As needed

## 2019-08-12 ENCOUNTER — Non-Acute Institutional Stay (SKILLED_NURSING_FACILITY): Payer: Medicare PPO | Admitting: Nurse Practitioner

## 2019-08-12 ENCOUNTER — Encounter: Payer: Self-pay | Admitting: Nurse Practitioner

## 2019-08-12 DIAGNOSIS — D649 Anemia, unspecified: Secondary | ICD-10-CM | POA: Diagnosis not present

## 2019-08-12 DIAGNOSIS — L03115 Cellulitis of right lower limb: Secondary | ICD-10-CM | POA: Insufficient documentation

## 2019-08-12 DIAGNOSIS — M25551 Pain in right hip: Secondary | ICD-10-CM | POA: Diagnosis not present

## 2019-08-12 DIAGNOSIS — G20 Parkinson's disease: Secondary | ICD-10-CM

## 2019-08-12 DIAGNOSIS — N3281 Overactive bladder: Secondary | ICD-10-CM

## 2019-08-12 DIAGNOSIS — K219 Gastro-esophageal reflux disease without esophagitis: Secondary | ICD-10-CM | POA: Diagnosis not present

## 2019-08-12 DIAGNOSIS — I872 Venous insufficiency (chronic) (peripheral): Secondary | ICD-10-CM | POA: Diagnosis not present

## 2019-08-12 DIAGNOSIS — I4819 Other persistent atrial fibrillation: Secondary | ICD-10-CM | POA: Diagnosis not present

## 2019-08-12 NOTE — Assessment & Plan Note (Signed)
lateral right lower leg sore, redness, swelling developed from the skin tear of the mid lateral right  lower leg 08/09/19, small dry blood seen on the dressing, steri strips closure is intact. Will treat with Keflex 500mg  po q8hr x 7 days.

## 2019-08-12 NOTE — Assessment & Plan Note (Signed)
Resolved,  on Tylenol 1000mg  tidprn, Methocarbamol 250mg  qd.

## 2019-08-12 NOTE — Assessment & Plan Note (Signed)
Stable, continue Fe 

## 2019-08-12 NOTE — Assessment & Plan Note (Signed)
PVD/edema BLE, on Metolazone 2.5mg  3x/wk. More swelling RLE since the onset of skin tear, developing cellulitis.

## 2019-08-12 NOTE — Progress Notes (Signed)
Location:   SNF [place] Room Number: 13 Place of Service:  SNF (31)SNF [facility] Provider: [facility] Almond Fitzgibbon NP  [name], MD  Patient Care Team: [name], MD as PCP - General (Internal Medicine) [name], [name], MD as PCP - Cardiology (Cardiology) [name], [name], DO as Consulting Physician (Neurology) Emori Kamau X, NP as Nurse Practitioner (Internal Medicine)  Extended Emergency Contact Information Primary Emergency Contact: Fedra, [place] Mobile Phone: [phone] Relation: Daughter Interpreter needed? No Secondary Emergency Contact: [facility] Address: [address]          [place], [place] [address] [name] of [place] Phone: [phone] Mobile Phone: [phone] Relation: Son  Code Status: DNR Goals of care: Advanced Directive information Advanced Directives 08/12/2019  Does Patient Have a Medical Advance Directive? Yes  Type of Paramedic of [place];Out of facility DNR (pink MOST or yellow form)  Does patient want to make changes to medical advance directive? No - Patient declined  Copy of [place] in Chart? Yes - validated most recent copy scanned in chart (See row information)  Would patient like information on creating a medical advance directive? -  Pre-existing out of facility DNR order (yellow form or pink MOST form) -     Chief Complaint  Patient presents with  . Acute Visit    Right lower leg redness and swelling    HPI:  Pt is a 79 y.o. female seen today for an acute visit for lateral right lower leg sore, redness, swelling developed from the skin tear of the mid lateral right lower leg, small dry blood seen on the dressing, steri strips closure is intact.   Urinary frequency, bathroom trips 0-1/night, on Mirabegron.              PVD/edema BLE, on Metolazone 2.81m 3x/wk.              Afib, heart rate is in control, on Xarelto 276mqd, Diltiazem 120104md. 08/09/19 Cardiology f/u.               R hip pain,  on Tylenol 1000m28mdprn, Methocarbamol 250mg27m   Parkinson's stable, f/u neurology, last 07/18/19, on Sinemet.             Anemia, stable, Hgb 11s,takes Fe             GERD, stable, on Omeprazole 20mg 44m Past Medical History:  Diagnosis Date  . Acute lower UTI 09/14/2018  . Anemia 10/15/2018   2016 colonoscopy 10/14/18 wbc 5.4, Hgb 10.1, plt 184 12/01/18 wbc 4.5, Hgb 11.1, plt 188, neutrophils 63,  Na 139, K 3.7, Bun 23, creat 0.69, eGFR 83 on Fe, Hgb 11.7 12/09/18   . Atrial fibrillation (HCC) [place]   10/07/18 Na 143, K 4.0, Bun 20, creat 0.79, eGFR 72, wbc 7.1, Hgb 10.9, plt 172 10/28/18 Na 142, K 4.0, Bun 19, creat 0.77, eGFR 74 11/18/18 Na 144, K 4.1, Bun 21, creat 0.69, eGFR 83  . Bilateral lower extremity edema 07/19/2018   11/02/18 BMP 2 weeks.   . Cervical pain (neck) 06/20/2010  . Closed fracture of part of upper end of humerus 05/01/2015  . Colles' fracture of right radius 03/05/2015  . Constipation 07/19/2018  . DEGENERATIVE JOINT DISEASE, RIGHT HIP 02/16/2007  . Dupuytren's contracture   . Dysuria 09/20/2018   09/20/18 c/o got up several times last night to go urinate, burning on urination, lower abd /back discomfort, but  urinary frequency, leakage are not new. UA C/S, Pyridium 110m tid x 2 days.  09/21/18 wbc 6.1, Hgb 11.8, plt 210, neutrophil 69.4, Na 142, K 4.1, Bun 19, creat 0.78, TP 6.4, albumin 3.9  . Hematuria 02/04/2014  . Hypotension 09/18/2018  . Long term (current) use of anticoagulants 05/29/2016  . Osteoarthritis of hip    right  . Osteoarthritis of left knee   . Overactive bladder 10/06/2018  . Parkinson disease ([place]   . Parox[place]mal A[place]fib ([place]   . POSTMENOPAUSAL SYNDROME 02/16/2007  . PREMATURE ATRIAL CONTRACTIONS 02/16/2007  . 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 S/P breast biopsy, left    two o'clock position - benign  . 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 Tremor, unspecified 10/19/2015  . Unstable gait 02/16/2017  .  Vaginal atrophy 10/19/2015  . Venous insufficiency   . Weakness 09/12/2018   Past Surgical History:  Procedure Laterality Date  . BREAST BIOPSY Left   . CATARACT EXTRACTION, BILATERAL      Allergies  Allergen Reactions  . Doxycycline Nausea And Vomiting  . Sulfonamide Derivatives     REACTION: HIVES    Allergies as of 08/12/2019      Reactions   Doxycycline Nausea And Vomiting   Sulfonamide Derivatives    REACTION: HIVES      Medication List       Accurate as of August 12, 2019  3:25 PM. If you have any questions, ask your nurse or doctor.        acetaminophen 500 MG tablet Commonly known as: TYLENOL Take 1,000 mg by mouth 3 (three) times daily as needed.   calcium carbonate 750 MG chewable tablet Commonly known as: TUMS EX Chew 1 tablet by mouth daily. Between the hours of 7am-10am.   carbidopa-levodopa 25-100 MG tablet Commonly known as: SINEMET IR Take 2 tablets by mouth 4 (four) times daily. Morning: 6:30 am, MidDay 10:30 am, Afternoon 2:30 pm, Evening 6:30 pm .   carbidopa-levodopa 50-200 MG tablet Commonly known as: SINEMET CR Take 1 tablet by mouth at bedtime. 9pm   diltiazem 120 MG 24 hr capsule Commonly known as: CARDIZEM CD Take 120 mg by mouth daily. Between the hours of 7am-10am. HOLD IF SBP < 100   ferrous sulfate 325 (65 FE) MG tablet Take 325 mg by mouth every Monday, Wednesday, and Friday. Give with food   hydrocortisone 2.5 % ointment Apply 1 application topically at bedtime. At bedtime for 1 week then change to PRN.   methocarbamol 500 MG tablet Commonly known as: ROBAXIN Take 250 mg by mouth daily. As needed   metolazone 2.5 MG tablet Commonly known as: ZAROXOLYN Take 2.5 mg by mouth every Monday, Wednesday, and Friday. Hold if systolic blood pressure less than 100   Myrbetriq 50 MG Tb24 tablet Generic drug: mirabegron ER Take 50 mg by mouth daily. Between the hours of 7am-10am.   omeprazole 20 MG capsule Commonly known as:  PRILOSEC Take 20 mg by mouth daily.   polyethylene glycol 17 g packet Commonly known as: MIRALAX / GLYCOLAX Take 17 g by mouth daily.   potassium chloride SA 20 MEQ tablet Commonly known as: KLOR-CON Take 20 mEq by mouth daily.   PreviDent 5000 Booster Plus 1.1 % Pste Generic drug: Sodium Fluoride Place 1 application onto teeth in the morning and at bedtime.   rivaroxaban 20 MG Tabs tablet Commonly known as: XARELTO Take 20 mg by mouth every evening. Between the hours of 5pm-6pm.  sennosides-docusate sodium 8.6-50 MG tablet Commonly known as: SENOKOT-S Take 1 tablet by mouth at bedtime. Between the hours of 7pm-10pm.   Vitamin D3 10 MCG (400 UNIT) Caps Take 1 capsule by mouth daily. Between the hours of 7am-10am.   zinc oxide 20 % ointment Apply 1 application topically as needed for irritation. To buttocks after every incontinent episode and as needed for redness. May keep at bedside.       Review of Systems  Constitutional: Negative for activity change, appetite change, fever and unexpected weight change.  HENT: Positive for hearing loss. Negative for congestion and voice change.   Eyes: Negative for visual disturbance.  Respiratory: Negative for cough.   Cardiovascular: Positive for leg swelling.  Gastrointestinal: Negative for abdominal pain and constipation.  Genitourinary: Negative for dysuria, frequency and urgency.  Musculoskeletal: Positive for arthralgias and gait problem. Negative for myalgias.       Right hip pain is well controlled.   Skin: Positive for wound. Negative for color change.       Right lower leg skin tear, developing redness, sore, warmth, swelling below the wound   Neurological: Positive for tremors. Negative for dizziness, speech difficulty, numbness and headaches.       Moves slow, fine tremor in fingers, burning sensation  in the R+ L great toes when touched by sheets, comes/goes  Psychiatric/Behavioral: Negative for agitation, behavioral  problems, hallucinations and sleep disturbance. The patient is not nervous/anxious.     Immunization History  Administered Date(s) Administered  . Influenza Split 10/20/2012  . Influenza Whole 10/21/2006  . Influenza, High Dose Seasonal PF 10/17/2016  . Influenza-Unspecified 10/20/2013, 10/22/2017  . Moderna SARS-COVID-2 Vaccination 01/22/2019, 02/19/2019  . Pneumococcal Conjugate-13 01/31/2013  . Pneumococcal Polysaccharide-23 01/20/2005, 11/24/2011  . Td 01/21/2004  . Tdap 03/16/2014  . Zoster 04/17/2009   Pertinent  Health Maintenance Due  Topic Date Due  . COLONOSCOPY  06/12/2019  . INFLUENZA VACCINE  08/21/2019  . DEXA SCAN  Completed  . PNA vac Low Risk Adult  Completed   Fall Risk  05/03/2018 11/03/2017 08/04/2017 03/06/2017 11/26/2016  Falls in the past year? 1 No No Yes Yes  Number falls in past yr: 0 - - 2 or more 2 or more  Injury with Fall? 0 - - No No  Risk Factor Category  - - - High Fall Risk High Fall Risk  Risk for fall due to : Other (Comment) - - - -  Follow up Falls evaluation completed;Education provided;Falls prevention discussed - - Falls evaluation completed Falls evaluation completed   Functional Status Survey:    Vitals:   08/12/19 1439  BP: (!) 110/62  Pulse: 80  Resp: 20  Temp: 97.8 F (36.6 C)  SpO2: 94%  Weight: 140 lb 1.6 oz (63.5 kg)  Height: '5\' 8"'  (1.727 m)   Body mass index is 21.3 kg/m. Physical Exam Vitals and nursing note reviewed.  Constitutional:      Appearance: Normal appearance.  HENT:     Head: Normocephalic and atraumatic.     Mouth/Throat:     Mouth: Mucous membranes are moist.  Eyes:     Extraocular Movements: Extraocular movements intact.     Conjunctiva/sclera: Conjunctivae normal.     Pupils: Pupils are equal, round, and reactive to light.  Cardiovascular:     Rate and Rhythm: Normal rate. Rhythm irregular.     Heart sounds: No murmur heard.      Comments: DP pulses present L>R Pulmonary:  Breath sounds:  No rales.  Abdominal:     General: There is no distension.     Palpations: Abdomen is soft.     Tenderness: There is no abdominal tenderness.  Musculoskeletal:     Cervical back: Normal range of motion and neck supple.     Right lower leg: Edema present.     Left lower leg: Edema present.     Comments: trace edema LLE, 1-2+ edema RLE  Skin:    General: Skin is warm and dry.     Findings: Erythema present.     Comments: A skin lesion at the lateral left lower leg-pending dermatology appointment lateral right lower leg sore, redness, warmth,  swelling developed from the skin tear of the mid lateral right lower leg, small dry blood seen on the dressing, steri strips closure is intact.    Neurological:     General: No focal deficit present.     Mental Status: She is alert and oriented to person, place, and time. Mental status is at baseline.     Motor: No weakness.     Coordination: Coordination abnormal.     Gait: Gait abnormal.     Comments: Moves slow, tremor in hands  Psychiatric:        Mood and Affect: Mood normal.        Behavior: Behavior normal.        Thought Content: Thought content normal.     Labs reviewed: Recent Labs    09/12/18 0304 09/12/18 0304 09/13/18 0201 09/13/18 0201 09/14/18 0043 09/21/18 0000 04/01/19 0000 05/17/19 0000 07/19/19 0000  NA 140   < > 140   < > 140   < > 140 140 138  K 3.6   < > 3.8   < > 3.9   < > 4.8 3.6 4.1  CL 108   < > 112*   < > 111   < > 104 103 104  CO2 21*   < > 22   < > 23   < > 31* 31* 29*  GLUCOSE 89  --  95  --  109*  --   --   --   --   BUN 27*   < > 24*   < > 15   < > 33* 22* 20  CREATININE 0.88   < > 0.75   < > 0.68   < > 1.0 0.7 0.7  CALCIUM 9.2   < > 8.4*   < > 8.5*   < > 9.3 9.4 8.9  MG  --   --  2.0  --   --   --   --   --   --    < > = values in this interval not displayed.   Recent Labs    09/12/18 0304 09/12/18 0304 09/13/18 0201 09/13/18 0201 09/21/18 0000 12/09/18 0000 03/24/19 0000 04/01/19 0000    AST 60*   < > 50*   < > 20 16 11* 12*  ALT 7   < > 7   < > 20 10 5*  --   ALKPHOS 45   < > 36*   < > 42 47 50 48  BILITOT 1.1  --  0.6  --   --   --   --   --   PROT 6.2*  --  4.9*  --  6.4  --   --   --   ALBUMIN 3.7   < >  2.8*   < > 3.9 3.8 4.0 3.8   < > = values in this interval not displayed.   Recent Labs    09/11/18 2105 09/11/18 2105 09/12/18 0304 09/12/18 0304 09/13/18 0201 09/21/18 0000 12/09/18 0000 12/09/18 0000 03/24/19 0000 04/01/19 0000 04/07/19 0000 04/14/19 0000 05/17/19 0000  WBC 10.6*   < > 8.0   < > 8.9   < > 5.5   < > 8.4   < > 6.6 5.9 5.2  NEUTROABS 9.4*   < >  --   --  7.1  --  3,718  --  6,602  --   --   --   --   HGB 12.6   < > 11.5*   < > 10.5*   < > 11.7*   < > 10.9*   < > 9.3* 9.7* 11.4*  HCT 40.1   < > 35.2*   < > 32.4*   < > 37   < > 34*   < > 29* 30* 34*  MCV 96.4  --  94.4  --  93.9  --   --   --   --   --   --   --   --   PLT 170   < > 150   < > 125*   < > 189   < > 190   < > 199 192 184   < > = values in this interval not displayed.   Lab Results  Component Value Date   TSH 0.408 09/11/2018   No results found for: HGBA1C Lab Results  Component Value Date   CHOL 185 03/24/2019   HDL 79 (A) 03/24/2019   LDLCALC 94 03/24/2019   LDLDIRECT 104.2 01/25/2013   TRIG 41 03/24/2019   CHOLHDL 2 08/29/2015    Significant Diagnostic Results in last 30 days:  No results found.  Assessment/Plan: Cellulitis of leg without foot, right lateral right lower leg sore, redness, swelling developed from the skin tear of the mid lateral right  lower leg 08/09/19, small dry blood seen on the dressing, steri strips closure is intact. Will treat with Keflex 536m po q8hr x 7 days.    Atrial fibrillation ([place] heart rate is in control, on Xarelto 255mqd, Diltiazem 12044md. 08/09/19 Cardiology f/u ? Why Metolazone instead of Furosemide.    Edema of left lower extremity due to peripheral venous insufficiency    PVD/edema BLE, on Metolazone 2.5mg36m/wk.  More swelling RLE since the onset of skin tear, developing cellulitis.   GERD (gastroesophageal reflux disease) Stable, continue Omeprazole.   [facility], last 07/18/19, on Sinemet.   Overactive bladder bathroom trips 0-1/night, continue  Mirabegron.    Anemia Stable, continue Fe  Right hip pain Resolved,  on Tylenol 1000mg61mprn, Methocarbamol 250mg 44m     Family/ staff Communication: plan of care reviewed with the patient and charge nurse.   Labs/tests ordered:  None  Time spend 25 minutes.

## 2019-08-12 NOTE — Assessment & Plan Note (Signed)
bathroom trips 0-1/night, continue  Mirabegron.

## 2019-08-12 NOTE — Assessment & Plan Note (Signed)
f/u neurology, last 07/18/19,on Sinemet. 

## 2019-08-12 NOTE — Assessment & Plan Note (Signed)
Stable, continue Omeprazole.  

## 2019-08-12 NOTE — Assessment & Plan Note (Addendum)
heart rate is in control, on Xarelto 20mg  qd, Diltiazem 120mg  qd. 08/09/19 Cardiology f/u ? Why Metolazone instead of Furosemide.

## 2019-08-19 DIAGNOSIS — R350 Frequency of micturition: Secondary | ICD-10-CM | POA: Diagnosis not present

## 2019-08-19 DIAGNOSIS — N3946 Mixed incontinence: Secondary | ICD-10-CM | POA: Diagnosis not present

## 2019-08-22 ENCOUNTER — Encounter: Payer: Self-pay | Admitting: Internal Medicine

## 2019-08-22 ENCOUNTER — Non-Acute Institutional Stay (SKILLED_NURSING_FACILITY): Payer: Medicare PPO | Admitting: Internal Medicine

## 2019-08-22 DIAGNOSIS — I872 Venous insufficiency (chronic) (peripheral): Secondary | ICD-10-CM

## 2019-08-22 DIAGNOSIS — I4819 Other persistent atrial fibrillation: Secondary | ICD-10-CM | POA: Diagnosis not present

## 2019-08-22 DIAGNOSIS — D649 Anemia, unspecified: Secondary | ICD-10-CM

## 2019-08-22 DIAGNOSIS — L02415 Cutaneous abscess of right lower limb: Secondary | ICD-10-CM | POA: Diagnosis not present

## 2019-08-22 DIAGNOSIS — L03115 Cellulitis of right lower limb: Secondary | ICD-10-CM

## 2019-08-22 DIAGNOSIS — N3281 Overactive bladder: Secondary | ICD-10-CM

## 2019-08-22 DIAGNOSIS — G20 Parkinson's disease: Secondary | ICD-10-CM | POA: Diagnosis not present

## 2019-08-22 NOTE — Progress Notes (Signed)
Location:   [place] Room Number: [place] of Service:  SNF [phone]) Provider:  Veleta Miners MD  [name], MD  Patient Care Team: [name], MD as PCP - General (Internal Medicine) [name], [name], MD as PCP - Cardiology (Cardiology) [name], [name], DO as Consulting Physician (Neurology) Mast, Man X, NP as Nurse Practitioner (Internal Medicine)  Extended Emergency Contact Information Primary Emergency Contact: Eboni, Coval Mobile Phone: [phone] Relation: Daughter Interpreter needed? No Secondary Emergency Contact: [facility] Address: [address]          [place], [place] [address] [name] of [place] Phone: [phone] Mobile Phone: [phone] Relation: Son  Code Status:  DNR Goals of care: Advanced Directive information Advanced Directives 08/12/2019  Does Patient Have a Medical Advance Directive? Yes  Type of Paramedic of [place];Out of facility DNR (pink MOST or yellow form)  Does patient want to make changes to medical advance directive? No - Patient declined  Copy of [place] in Chart? Yes - validated most recent copy scanned in chart (See row information)  Would patient like information on creating a medical advance directive? -  Pre-existing out of facility DNR order (yellow form or pink MOST form) -     Chief Complaint  Patient presents with  . Acute Visit    Cellulitis    HPI:  Pt is a 79 y.o. female seen today for an acute visit for right leg cellulitis  Patient has a history of Parkinson disease diagnosed 2 years ago on Sinemet and follows withNeurology,paroxysmal A. fib on Xarelto, hyperlipidemia, H/o Ovarian Cyst, Lower extremity edema,2 D echo with N EF Biatrial enlargementand Urinary Incontinence Has Chronic Right Hip Pain. 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  Patient was recently  treated for right leg cellulitis.  With Keflex for 7 days.  Patient did get better but over the weekend she spiked a low-grade temp and then had worsening pain redness again in the right extremity. She was empirically started on Keflex again.  Today the leg is much better the redness is almost completely gone.  But patient has a small swelling around her skin tear which is very tender. Past Medical History:  Diagnosis Date  . Acute lower UTI 09/14/2018  . Anemia 10/15/2018   2016 colonoscopy 10/14/18 wbc 5.4, Hgb 10.1, plt 184 12/01/18 wbc 4.5, Hgb 11.1, plt 188, neutrophils 63,  Na 139, K 3.7, Bun 23, creat 0.69, eGFR 83 on Fe, Hgb 11.7 12/09/18   . Atrial fibrillation ([place]) 05/21/2010   10/07/18 Na 143, K 4.0, Bun 20, creat 0.79, eGFR 72, wbc 7.1, Hgb 10.9, plt 172 10/28/18 Na 142, K 4.0, Bun 19, creat 0.77, eGFR 74 11/18/18 Na 144, K 4.1, Bun 21, creat 0.69, eGFR 83  . Bilateral lower extremity edema 07/19/2018   11/02/18 BMP 2 weeks.   . Cervical pain (neck) 06/20/2010  . Closed fracture of part of upper end of humerus 05/01/2015  . Colles' fracture of right radius 03/05/2015  . Constipation 07/19/2018  . DEGENERATIVE JOINT DISEASE, RIGHT HIP 02/16/2007  . Dupuytren's contracture   . Dysuria 09/20/2018   09/20/18 c/o got up several times last night to go urinate, burning on urination, lower abd /back discomfort, but urinary frequency, leakage are not new. UA C/S, Pyridium 130m tid x 2 days.  09/21/18 wbc 6.1, Hgb 11.8, plt 210, neutrophil 69.4, Na 142,  K 4.1, Bun 19, creat 0.78, TP 6.4, albumin 3.9  . Hematuria 02/04/2014  . Hypotension 09/18/2018  . Long term (current) use of anticoagulants 05/29/2016  . Osteoarthritis of hip    right  . Osteoarthritis of left knee   . Overactive bladder 10/06/2018  . Parkinson disease ([place])   . Paroxysmal A-fib ([place])   . POSTMENOPAUSAL SYNDROME 02/16/2007  . PREMATURE ATRIAL CONTRACTIONS 02/16/2007  . 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 S/P breast  biopsy, left    two o'clock position - benign  . 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 Tremor, unspecified 10/19/2015  . Unstable gait 02/16/2017  . Vaginal atrophy 10/19/2015  . Venous insufficiency   . Weakness 09/12/2018   Past Surgical History:  Procedure Laterality Date  . BREAST BIOPSY Left   . CATARACT EXTRACTION, BILATERAL      Allergies  Allergen Reactions  . Doxycycline Nausea And Vomiting  . Sulfonamide Derivatives     REACTION: HIVES    Allergies as of 08/22/2019      Reactions   Doxycycline Nausea And Vomiting   Sulfonamide Derivatives    REACTION: HIVES      Medication List       Accurate as of August 22, 2019 11:18 AM. If you have any questions, ask your nurse or doctor.        STOP taking these medications   sennosides-docusate sodium 8.6-50 MG tablet Commonly known as: SENOKOT-S Stopped by: [name], MD     TAKE these medications   acetaminophen 500 MG tablet Commonly known as: TYLENOL Take 1,000 mg by mouth 3 (three) times daily as needed.   calcium carbonate 750 MG chewable tablet Commonly known as: TUMS EX Chew 1 tablet by mouth daily. Between the hours of 7am-10am.   carbidopa-levodopa 25-100 MG tablet Commonly known as: SINEMET IR Take 2 tablets by mouth 4 (four) times daily. Morning: 6:30 am, MidDay 10:30 am, Afternoon 2:30 pm, Evening 6:30 pm .   carbidopa-levodopa 50-200 MG tablet Commonly known as: SINEMET CR Take 1 tablet by mouth at bedtime. 9pm   cephALEXin 500 MG capsule Commonly known as: KEFLEX Take 500 mg by mouth 3 (three) times daily.   diltiazem 120 MG 24 hr capsule Commonly known as: CARDIZEM CD Take 120 mg by mouth daily. Between the hours of 7am-10am. HOLD IF SBP < 100   ferrous sulfate 325 (65 FE) MG tablet Take 325 mg by mouth every Monday, Wednesday, and Friday. Give with food   hydrocortisone 2.5 % ointment Apply 1 application topically at bedtime. At bedtime for 1 week then change to PRN.     methocarbamol 500 MG tablet Commonly known as: ROBAXIN Take 250 mg by mouth daily. As needed   metolazone 2.5 MG tablet Commonly known as: ZAROXOLYN Take 2.5 mg by mouth every Monday, Wednesday, and Friday. Hold if systolic blood pressure less than 100   Myrbetriq 50 MG Tb24 tablet Generic drug: mirabegron ER Take 50 mg by mouth daily. Between the hours of 7am-10am.   omeprazole 20 MG capsule Commonly known as: PRILOSEC Take 20 mg by mouth daily.   polyethylene glycol 17 g packet Commonly known as: MIRALAX / GLYCOLAX Take 17 g by mouth daily.   potassium chloride SA 20 MEQ tablet Commonly known as: KLOR-CON Take 20 mEq by mouth daily.   PreviDent 5000 Booster Plus 1.1 % Pste Generic drug: Sodium Fluoride Place 1 application onto teeth in the morning and at bedtime.  rivaroxaban 20 MG Tabs tablet Commonly known as: XARELTO Take 20 mg by mouth every evening. Between the hours of 5pm-6pm.   Vitamin D3 10 MCG (400 UNIT) Caps Take 1 capsule by mouth daily. Between the hours of 7am-10am.   zinc oxide 20 % ointment Apply 1 application topically as needed for irritation. To buttocks after every incontinent episode and as needed for redness. May keep at bedside.       Review of Systems  Constitutional: Negative.   HENT: Negative.   Respiratory: Negative.   Cardiovascular: Positive for leg swelling.  Gastrointestinal: Negative.   Genitourinary: Negative.   Musculoskeletal: Positive for gait problem.  Neurological: Positive for weakness.  Psychiatric/Behavioral: Negative.     Immunization History  Administered Date(s) Administered  . Influenza Split 10/20/2012  . Influenza Whole 10/21/2006  . Influenza, High Dose Seasonal PF 10/17/2016  . Influenza-Unspecified 10/20/2013, 10/22/2017  . Moderna SARS-COVID-2 Vaccination 01/22/2019, 02/19/2019  . Pneumococcal Conjugate-13 01/31/2013  . Pneumococcal Polysaccharide-23 01/20/2005, 11/24/2011  . Td 01/21/2004  . Tdap  03/16/2014  . Zoster 04/17/2009   Pertinent  Health Maintenance Due  Topic Date Due  . COLONOSCOPY  06/12/2019  . INFLUENZA VACCINE  08/21/2019  . DEXA SCAN  Completed  . PNA vac Low Risk Adult  Completed   Fall Risk  05/03/2018 11/03/2017 08/04/2017 03/06/2017 11/26/2016  Falls in the past year? 1 No No Yes Yes  Number falls in past yr: 0 - - 2 or more 2 or more  Injury with Fall? 0 - - No No  Risk Factor Category  - - - High Fall Risk High Fall Risk  Risk for fall due to : Other (Comment) - - - -  Follow up Falls evaluation completed;Education provided;Falls prevention discussed - - Falls evaluation completed Falls evaluation completed   Functional Status Survey:    Vitals:   08/22/19 1112  BP: (!) 100/60  Pulse: 64  Resp: 18  Temp: (!) 97.5 F (36.4 C)  SpO2: 98%  Weight: 140 lb 8 oz (63.7 kg)  Height: _0  (1.727 m)   Body mass index is 21.36 kg/m. Physical Exam Vitals reviewed.  HENT:     Head: Normocephalic.     Nose: Nose normal.     Mouth/Throat:     Mouth: Mucous membranes are moist.     Pharynx: Oropharynx is clear.  Eyes:     Pupils: Pupils are equal, round, and reactive to light.  Cardiovascular:     Rate and Rhythm: Normal rate.     Pulses: Normal pulses.  Pulmonary:     Effort: Pulmonary effort is normal.     Breath sounds: Normal breath sounds.  Abdominal:     General: Abdomen is flat. Bowel sounds are normal.     Palpations: Abdomen is soft.  Musculoskeletal:     Cervical back: Neck supple.     Comments: Mild swelling bilateral  Skin:    General: Skin is warm.     Comments: In the right lower extremity the redness is completely gone at this time.  But patient had 5 to 6 cm swollen area around her skin tear which is very tender.  Is not warm.  Feels like an abscess.  Neurological:     General: No focal deficit present.     Mental Status: She is alert.  Psychiatric:        Mood and Affect: Mood normal.        Thought Content: Thought content  normal.  Labs reviewed: Recent Labs    09/12/18 0304 09/12/18 0304 09/13/18 0201 09/13/18 0201 09/14/18 0043 09/21/18 0000 04/01/19 0000 05/17/19 0000 07/19/19 0000  NA 140   < > 140   < > 140   < > 140 140 138  K 3.6   < > 3.8   < > 3.9   < > 4.8 3.6 4.1  CL 108   < > 112*   < > 111   < > 104 103 104  CO2 21*   < > 22   < > 23   < > 31* 31* 29*  GLUCOSE 89  --  95  --  109*  --   --   --   --   BUN 27*   < > 24*   < > 15   < > 33* 22* 20  CREATININE 0.88   < > 0.75   < > 0.68   < > 1.0 0.7 0.7  CALCIUM 9.2   < > 8.4*   < > 8.5*   < > 9.3 9.4 8.9  MG  --   --  2.0  --   --   --   --   --   --    < > = values in this interval not displayed.   Recent Labs    09/12/18 0304 09/12/18 0304 09/13/18 0201 09/13/18 0201 09/21/18 0000 12/09/18 0000 03/24/19 0000 04/01/19 0000  AST 60*   < > 50*   < > 20 16 11* 12*  ALT 7   < > 7   < > 20 10 5*  --   ALKPHOS 45   < > 36*   < > 42 47 50 48  BILITOT 1.1  --  0.6  --   --   --   --   --   PROT 6.2*  --  4.9*  --  6.4  --   --   --   ALBUMIN 3.7   < > 2.8*   < > 3.9 3.8 4.0 3.8   < > = values in this interval not displayed.   Recent Labs    09/11/18 2105 09/11/18 2105 09/12/18 0304 09/12/18 0304 09/13/18 0201 09/21/18 0000 12/09/18 0000 12/09/18 0000 03/24/19 0000 04/01/19 0000 04/07/19 0000 04/14/19 0000 05/17/19 0000  WBC 10.6*   < > 8.0   < > 8.9   < > 5.5   < > 8.4   < > 6.6 5.9 5.2  NEUTROABS 9.4*   < >  --   --  7.1  --  3,718  --  6,602  --   --   --   --   HGB 12.6   < > 11.5*   < > 10.5*   < > 11.7*   < > 10.9*   < > 9.3* 9.7* 11.4*  HCT 40.1   < > 35.2*   < > 32.4*   < > 37   < > 34*   < > 29* 30* 34*  MCV 96.4  --  94.4  --  93.9  --   --   --   --   --   --   --   --   PLT 170   < > 150   < > 125*   < > 189   < > 190   < > 199 192 184   < > = values in this interval not  displayed.   Lab Results  Component Value Date   TSH 0.408 09/11/2018   No results found for: HGBA1C Lab Results  Component  Value Date   CHOL 185 03/24/2019   HDL 79 (A) 03/24/2019   LDLCALC 94 03/24/2019   LDLDIRECT 104.2 01/25/2013   TRIG 41 03/24/2019   CHOLHDL 2 08/29/2015    Significant Diagnostic Results in last 30 days:  No results found.  Assessment/Plan Right lower extremity cellulitis with possible abscess I am worried that patient has a small abscess below her skin tear. We discussed the option.  At this time with continued antibiotics.  If no improvement she would need I&D  If she does spike fever again she needs to go to the emergency room for possible I&D. Patient is on Xarelto for her A. fib  Bilateral lower extremity edema Doing well on Metolazone  Has refused to take Lasix before because of fear of dizziness BUN and Creat stable   Anemia, unspecified type On  Iron  Right hip pain Doing well with Pain control On restorative therapy Does not want surgery right now  Persistent atrial fibrillation (HCC) On Xarelto and Cardizem  Parkinsonism, unspecified Parkinsonism type ([place]) Doing well with Sinemet   Hypotension, unspecified hypotension type BP sometimes low ? Consider Midodrine if Patient gets Symptomatic Constipation Miralax Urinary Incontinence Symptoms are controlled on Myrebetriq   Family/ staff Communication:   Labs/tests ordered:

## 2019-09-01 ENCOUNTER — Encounter: Payer: Self-pay | Admitting: Internal Medicine

## 2019-09-01 DIAGNOSIS — N390 Urinary tract infection, site not specified: Secondary | ICD-10-CM | POA: Diagnosis not present

## 2019-09-02 ENCOUNTER — Non-Acute Institutional Stay (SKILLED_NURSING_FACILITY): Payer: Medicare PPO | Admitting: Nurse Practitioner

## 2019-09-02 ENCOUNTER — Encounter: Payer: Self-pay | Admitting: Nurse Practitioner

## 2019-09-02 DIAGNOSIS — I4819 Other persistent atrial fibrillation: Secondary | ICD-10-CM | POA: Diagnosis not present

## 2019-09-02 DIAGNOSIS — M25551 Pain in right hip: Secondary | ICD-10-CM

## 2019-09-02 DIAGNOSIS — D649 Anemia, unspecified: Secondary | ICD-10-CM

## 2019-09-02 DIAGNOSIS — N3281 Overactive bladder: Secondary | ICD-10-CM

## 2019-09-02 DIAGNOSIS — K219 Gastro-esophageal reflux disease without esophagitis: Secondary | ICD-10-CM | POA: Diagnosis not present

## 2019-09-02 DIAGNOSIS — T148XXA Other injury of unspecified body region, initial encounter: Secondary | ICD-10-CM

## 2019-09-02 DIAGNOSIS — G20 Parkinson's disease: Secondary | ICD-10-CM | POA: Diagnosis not present

## 2019-09-02 DIAGNOSIS — I872 Venous insufficiency (chronic) (peripheral): Secondary | ICD-10-CM

## 2019-09-02 DIAGNOSIS — K5900 Constipation, unspecified: Secondary | ICD-10-CM

## 2019-09-02 DIAGNOSIS — L089 Local infection of the skin and subcutaneous tissue, unspecified: Secondary | ICD-10-CM | POA: Insufficient documentation

## 2019-09-02 NOTE — Assessment & Plan Note (Signed)
Noted lateral right lower leg indurated area about the patient's palm size, slightly bluish, no heat, redness, or warmth. It seems to me more hematoma than an abscess. The patient agreed to watchful waiting, the patient said she will call her daughter around 4pm to let her know when I offered a phone call to her daughter.

## 2019-09-02 NOTE — Progress Notes (Signed)
Location:   [facility] Room Number: 13 Place of Service:  SNF (31) Provider:  Marda Stalker, Lennie Odor NP   [name], MD  Patient Care Team: [name], MD as PCP - General (Internal Medicine) [name], [name], MD as PCP - Cardiology (Cardiology) [name], [name], DO as Consulting Physician (Neurology) Tracye Szuch X, NP as Nurse Practitioner (Internal Medicine)  Extended Emergency Contact Information Primary Emergency Contact: Johnae, [place]ley Mobile Phone: [phone] Relation: Daughter Interpreter needed? No Secondary Emergency Contact: [facility] Address: [address]          [place], [place] [address] [name] of [place] Phone: [phone] Mobile Phone: [phone] Relation: Son  Code Status:  DNR Goals of care: Advanced Directive information Advanced Directives 08/12/2019  Does Patient Have a Medical Advance Directive? Yes  Type of Paramedic of [place];Out of facility DNR (pink MOST or yellow form)  Does patient want to make changes to medical advance directive? No - Patient declined  Copy of [place] in Chart? Yes - validated most recent copy scanned in chart (See row information)  Would patient like information on creating a medical advance directive? -  Pre-existing out of facility DNR order (yellow form or pink MOST form) -     Chief Complaint  Patient presents with  . Acute Visit    Leg Abscess    HPI:  Pt is a 79 y.o. female seen today for an acute visit for lateral right lower leg indurated area about the patient's palm size resulted from previous skin tear and fully  treated for possible abscess. The patient denied fever, chills, generalized malaise, change of appetite, nausea, vomiting  Constipation, takes MiraLax  PVD/edema BLE, on Metolazone 2.71m 3x/wk.  Afib, heart rate is in control, on Xarelto 265mqd, Diltiazem 1204md. 08/09/19 Cardiology f/u.    R hip pain, on Tylenol 1000m6mdprn, Methocarbamol 250mg23m    Parkinson's stable, f/u neurology, last 07/18/19,on Sinemet. Anemia, stable, Hgb 11s,takes Fe GERD, stable, on Omeprazole 20mg 33m Urinary frequency, takes Mirabegron.      Past Medical History:  Diagnosis Date  . Acute lower UTI 09/14/2018  . Anemia 10/15/2018   2016 colonoscopy 10/14/18 wbc 5.4, Hgb 10.1, plt 184 12/01/18 wbc 4.5, Hgb 11.1, plt 188, neutrophils 63,  Na 139, K 3.7, Bun 23, creat 0.69, eGFR 83 on Fe, Hgb 11.7 12/09/18   . Atrial fibrillation (HCC) [id number]   10/07/18 Na 143, K 4.0, Bun 20, creat 0.79, eGFR 72, wbc 7.1, Hgb 10.9, plt 172 10/28/18 Na 142, K 4.0, Bun 19, creat 0.77, eGFR 74 11/18/18 Na 144, K 4.1, Bun 21, creat 0.69, eGFR 83  . Bilateral lower extremity edema 07/19/2018   11/02/18 BMP 2 weeks.   . Cervical pain (neck) 06/20/2010  . Closed fracture of part of upper end of humerus 05/01/2015  . Colles' fracture of right radius 03/05/2015  . Constipation 07/19/2018  . DEGENERATIVE JOINT DISEASE, RIGHT HIP 02/16/2007  . Dupuytren's contracture   . Dysuria 09/20/2018   09/20/18 c/o got up several times last night to go urinate, burning on urination, lower abd /back discomfort, but urinary frequency, leakage are not new. UA C/S, Pyridium 100mg t77m 2 days.  09/21/18 wbc 6.1, Hgb 11.8, plt 210, neutrophil 69.4, Na 142, K 4.1, Bun 19, creat 0.78, TP 6.4, albumin 3.9  . Hematuria 02/04/2014  . Hypotension 09/18/2018  . Long term (current) use of anticoagulants 05/29/2016  .  Osteoarthritis of hip    right  . Osteoarthritis of left knee   . Overactive bladder 10/06/2018  . Parkinson disease ([place])   . Paroxysmal A-fib ([place])   . POSTMENOPAUSAL SYNDROME 02/16/2007  . PREMATURE ATRIAL CONTRACTIONS 02/16/2007  . 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 S/P breast biopsy, left    two o'clock position - benign  . 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 Tremor,  unspecified 10/19/2015  . Unstable gait 02/16/2017  . Vaginal atrophy 10/19/2015  . Venous insufficiency   . Weakness 09/12/2018   Past Surgical History:  Procedure Laterality Date  . BREAST BIOPSY Left   . CATARACT EXTRACTION, BILATERAL      Allergies  Allergen Reactions  . Doxycycline Nausea And Vomiting  . Sulfonamide Derivatives     REACTION: HIVES    Allergies as of 09/02/2019      Reactions   Doxycycline Nausea And Vomiting   Sulfonamide Derivatives    REACTION: HIVES      Medication List       Accurate as of September 02, 2019 11:59 PM. If you have any questions, ask your nurse or doctor.        acetaminophen 500 MG tablet Commonly known as: TYLENOL Take 1,000 mg by mouth 3 (three) times daily as needed.   calcium carbonate 750 MG chewable tablet Commonly known as: TUMS EX Chew 1 tablet by mouth daily. Between the hours of 7am-10am.   carbidopa-levodopa 25-100 MG tablet Commonly known as: SINEMET IR Take 2 tablets by mouth 4 (four) times daily. Morning: 6:30 am, MidDay 10:30 am, Afternoon 2:30 pm, Evening 6:30 pm .   carbidopa-levodopa 50-200 MG tablet Commonly known as: SINEMET CR Take 1 tablet by mouth at bedtime. 9pm   diltiazem 120 MG 24 hr capsule Commonly known as: CARDIZEM CD Take 120 mg by mouth daily. Between the hours of 7am-10am. HOLD IF SBP < 100   ferrous sulfate 325 (65 FE) MG tablet Take 325 mg by mouth every Monday, Wednesday, and Friday. Give with food   hydrocortisone 2.5 % ointment Apply 1 application topically at bedtime. At bedtime for 1 week then change to PRN.   methocarbamol 500 MG tablet Commonly known as: ROBAXIN Take 250 mg by mouth daily. As needed   metolazone 2.5 MG tablet Commonly known as: ZAROXOLYN Take 2.5 mg by mouth every Monday, Wednesday, and Friday. Hold if systolic blood pressure less than 100   Myrbetriq 50 MG Tb24 tablet Generic drug: mirabegron ER Take 50 mg by mouth daily. Between the hours of  7am-10am.   omeprazole 20 MG capsule Commonly known as: PRILOSEC Take 20 mg by mouth daily.   polyethylene glycol 17 g packet Commonly known as: MIRALAX / GLYCOLAX Take 17 g by mouth daily.   potassium chloride SA 20 MEQ tablet Commonly known as: KLOR-CON Take 20 mEq by mouth daily.   PreviDent 5000 Booster Plus 1.1 % Pste Generic drug: Sodium Fluoride Place 1 application onto teeth in the morning and at bedtime.   rivaroxaban 20 MG Tabs tablet Commonly known as: XARELTO Take 20 mg by mouth every evening. Between the hours of 5pm-6pm.   Vitamin D3 10 MCG (400 UNIT) Caps Take 1 capsule by mouth daily. Between the hours of 7am-10am.   zinc oxide 20 % ointment Apply 1 application topically as needed for irritation. To buttocks after every incontinent episode and as needed for redness. May keep at bedside.       Review  of Systems  Constitutional: Negative for appetite change, fatigue and fever.  HENT: Positive for hearing loss. Negative for congestion and voice change.   Eyes: Negative for visual disturbance.  Respiratory: Negative for cough.   Cardiovascular: Positive for leg swelling.  Gastrointestinal: Negative for abdominal pain and constipation.  Genitourinary: Negative for dysuria, frequency and urgency.  Musculoskeletal: Positive for arthralgias and gait problem. Negative for myalgias.       Right hip pain is well controlled.   Skin: Positive for wound. Negative for color change.       Right lower leg skin tear is healed, indurated area is about the patient's palm size, no redness, warmth  Neurological: Positive for tremors. Negative for speech difficulty and headaches.       Moves slow, fine tremor in fingers, burning sensation  in the R+ L great toes when touched by sheets, comes/goes  Psychiatric/Behavioral: Negative for behavioral problems and sleep disturbance. The patient is not nervous/anxious.     Immunization History  Administered Date(s) Administered  .  Influenza Split 10/20/2012  . Influenza Whole 10/21/2006  . Influenza, High Dose Seasonal PF 10/17/2016  . Influenza-Unspecified 10/20/2013, 10/22/2017  . Moderna SARS-COVID-2 Vaccination 01/22/2019, 02/19/2019  . Pneumococcal Conjugate-13 01/31/2013  . Pneumococcal Polysaccharide-23 01/20/2005, 11/24/2011  . Td 01/21/2004  . Tdap 03/16/2014  . Zoster 04/17/2009   Pertinent  Health Maintenance Due  Topic Date Due  . COLONOSCOPY  06/12/2019  . INFLUENZA VACCINE  08/21/2019  . DEXA SCAN  Completed  . PNA vac Low Risk Adult  Completed   Fall Risk  05/03/2018 11/03/2017 08/04/2017 03/06/2017 11/26/2016  Falls in the past year? 1 No No Yes Yes  Number falls in past yr: 0 - - 2 or more 2 or more  Injury with Fall? 0 - - No No  Risk Factor Category  - - - High Fall Risk High Fall Risk  Risk for fall due to : Other (Comment) - - - -  Follow up Falls evaluation completed;Education provided;Falls prevention discussed - - Falls evaluation completed Falls evaluation completed   Functional Status Survey:    Vitals:   09/02/19 1054  BP: 96/68  Pulse: 78  Resp: 18  Temp: (!) 97.1 F (36.2 C)  SpO2: 97%  Weight: 141 lb 6.4 oz (64.1 kg)  Height: '5\' 8"'  (1.727 m)   Body mass index is 21.5 kg/m. Physical Exam Vitals and nursing note reviewed.  Constitutional:      Appearance: Normal appearance.  HENT:     Head: Normocephalic and atraumatic.     Mouth/Throat:     Mouth: Mucous membranes are moist.  Eyes:     Extraocular Movements: Extraocular movements intact.     Conjunctiva/sclera: Conjunctivae normal.     Pupils: Pupils are equal, round, and reactive to light.  Cardiovascular:     Rate and Rhythm: Normal rate. Rhythm irregular.     Heart sounds: No murmur heard.      Comments: DP pulses present L>R Pulmonary:     Breath sounds: No rales.  Abdominal:     General: Bowel sounds are normal.     Palpations: Abdomen is soft.     Tenderness: There is no abdominal tenderness.   Musculoskeletal:     Cervical back: Normal range of motion and neck supple.     Right lower leg: Edema present.     Left lower leg: Edema present.     Comments: trace edema LLE, 1-2+ edema RLE  Skin:  General: Skin is warm and dry.     Findings: Bruising present. No erythema.     Comments: lateral right lower leg a healed skin tear area indurated about the patient's palm sized, no redness or warmth.    Neurological:     General: No focal deficit present.     Mental Status: She is alert and oriented to person, place, and time. Mental status is at baseline.     Motor: No weakness.     Coordination: Coordination abnormal.     Gait: Gait abnormal.     Comments: Moves slow, tremor in hands  Psychiatric:        Mood and Affect: Mood normal.        Behavior: Behavior normal.        Thought Content: Thought content normal.     Labs reviewed: Recent Labs    09/12/18 0304 09/12/18 0304 09/13/18 0201 09/13/18 0201 09/14/18 0043 09/21/18 0000 04/01/19 0000 05/17/19 0000 07/19/19 0000  NA 140   < > 140   < > 140   < > 140 140 138  K 3.6   < > 3.8   < > 3.9   < > 4.8 3.6 4.1  CL 108   < > 112*   < > 111   < > 104 103 104  CO2 21*   < > 22   < > 23   < > 31* 31* 29*  GLUCOSE 89  --  95  --  109*  --   --   --   --   BUN 27*   < > 24*   < > 15   < > 33* 22* 20  CREATININE 0.88   < > 0.75   < > 0.68   < > 1.0 0.7 0.7  CALCIUM 9.2   < > 8.4*   < > 8.5*   < > 9.3 9.4 8.9  MG  --   --  2.0  --   --   --   --   --   --    < > = values in this interval not displayed.   Recent Labs    09/12/18 0304 09/12/18 0304 09/13/18 0201 09/13/18 0201 09/21/18 0000 12/09/18 0000 03/24/19 0000 04/01/19 0000  AST 60*   < > 50*   < > 20 16 11* 12*  ALT 7   < > 7   < > 20 10 5*  --   ALKPHOS 45   < > 36*   < > 42 47 50 48  BILITOT 1.1  --  0.6  --   --   --   --   --   PROT 6.2*  --  4.9*  --  6.4  --   --   --   ALBUMIN 3.7   < > 2.8*   < > 3.9 3.8 4.0 3.8   < > = values in this interval  not displayed.   Recent Labs    09/11/18 2105 09/11/18 2105 09/12/18 0304 09/12/18 0304 09/13/18 0201 09/21/18 0000 12/09/18 0000 12/09/18 0000 03/24/19 0000 04/01/19 0000 04/07/19 0000 04/14/19 0000 05/17/19 0000  WBC 10.6*   < > 8.0   < > 8.9   < > 5.5   < > 8.4   < > 6.6 5.9 5.2  NEUTROABS 9.4*   < >  --   --  7.1  --  3,718  --  6,602  --   --   --   --  HGB 12.6   < > 11.5*   < > 10.5*   < > 11.7*   < > 10.9*   < > 9.3* 9.7* 11.4*  HCT 40.1   < > 35.2*   < > 32.4*   < > 37   < > 34*   < > 29* 30* 34*  MCV 96.4  --  94.4  --  93.9  --   --   --   --   --   --   --   --   PLT 170   < > 150   < > 125*   < > 189   < > 190   < > 199 192 184   < > = values in this interval not displayed.   Lab Results  Component Value Date   TSH 0.408 09/11/2018   No results found for: HGBA1C Lab Results  Component Value Date   CHOL 185 03/24/2019   HDL 79 (A) 03/24/2019   LDLCALC 94 03/24/2019   LDLDIRECT 104.2 01/25/2013   TRIG 41 03/24/2019   CHOLHDL 2 08/29/2015    Significant Diagnostic Results in last 30 days:  No results found.  Assessment/Plan Hematoma Noted lateral right lower leg indurated area about the patient's palm size, slightly bluish, no heat, redness, or warmth. It seems to me more hematoma than an abscess. The patient agreed to watchful waiting, the patient said she will call her daughter around 4pm to let her know when I offered a phone call to her daughter.    Atrial fibrillation ([place]) heart rate is in control, on Xarelto 46m qd, Diltiazem 1273mqd. 08/09/19 Cardiology f/u.    Edema of left lower extremity due to peripheral venous insufficiency PVD/edema BLE, chronic, mild edema, continue Metolazone 2.21m19mx/wk.    Constipation Stable, continue MiraLax.   Right hip pain R hip pain, on Tylenol 1000m1mdprn, Methocarbamol 250mg12m   Parkinsonism (HCC) [place], f/u neurology, last 07/18/19,continue  Sinemet.   Anemia Hgb 11s, continue Fe  GERD  (gastroesophageal reflux disease) Stable, continue Omeprazole.   Overactive bladder Stable, continue continue Mirabegron.       Family/ staff Communication: plan of care reviewed with the patient and charge nurse.   Labs/tests ordered: none  Time spend 25 minutes.

## 2019-09-03 NOTE — Assessment & Plan Note (Signed)
Stable, continue MiraLax.  °

## 2019-09-03 NOTE — Assessment & Plan Note (Signed)
stable, f/u neurology, last 07/18/19,continue  Sinemet.

## 2019-09-03 NOTE — Assessment & Plan Note (Signed)
Hgb 11s, continue Fe

## 2019-09-03 NOTE — Assessment & Plan Note (Signed)
Stable, continue Omeprazole.  

## 2019-09-03 NOTE — Assessment & Plan Note (Signed)
Stable, continue continue Mirabegron.

## 2019-09-03 NOTE — Assessment & Plan Note (Signed)
R hip pain, on Tylenol 1000mg tidprn, Methocarbamol 250mg qd.   

## 2019-09-03 NOTE — Assessment & Plan Note (Signed)
heart rate is in control, on Xarelto 20mg  qd, Diltiazem 120mg  qd. 08/09/19 Cardiology f/u.

## 2019-09-03 NOTE — Assessment & Plan Note (Signed)
PVD/edema BLE, chronic, mild edema, continue Metolazone 2.5mg  3x/wk.

## 2019-09-05 ENCOUNTER — Encounter: Payer: Self-pay | Admitting: Nurse Practitioner

## 2019-09-05 ENCOUNTER — Non-Acute Institutional Stay (SKILLED_NURSING_FACILITY): Payer: Medicare PPO | Admitting: Nurse Practitioner

## 2019-09-05 DIAGNOSIS — K5900 Constipation, unspecified: Secondary | ICD-10-CM

## 2019-09-05 DIAGNOSIS — D649 Anemia, unspecified: Secondary | ICD-10-CM | POA: Diagnosis not present

## 2019-09-05 DIAGNOSIS — G20 Parkinson's disease: Secondary | ICD-10-CM | POA: Diagnosis not present

## 2019-09-05 DIAGNOSIS — N3281 Overactive bladder: Secondary | ICD-10-CM

## 2019-09-05 DIAGNOSIS — I4819 Other persistent atrial fibrillation: Secondary | ICD-10-CM

## 2019-09-05 DIAGNOSIS — R079 Chest pain, unspecified: Secondary | ICD-10-CM | POA: Diagnosis not present

## 2019-09-05 DIAGNOSIS — R600 Localized edema: Secondary | ICD-10-CM

## 2019-09-05 DIAGNOSIS — M546 Pain in thoracic spine: Secondary | ICD-10-CM | POA: Insufficient documentation

## 2019-09-05 DIAGNOSIS — M159 Polyosteoarthritis, unspecified: Secondary | ICD-10-CM | POA: Diagnosis not present

## 2019-09-05 DIAGNOSIS — K219 Gastro-esophageal reflux disease without esophagitis: Secondary | ICD-10-CM

## 2019-09-05 NOTE — Assessment & Plan Note (Addendum)
pain in the right mid back at the tip of the scapular, no thoracic spinal spinous process tenderness, pain worsens with deep breathing, on set is acute onset this morning before getting out of bed, denied injury or fall, no O2 desaturation, she is afebrile, no noted cough, sore throat, or palpitation/chest pressure.  Will obtain X-ray chest, right ribs, thoracic spine. Update CBC/diff, CMP/eGFR

## 2019-09-05 NOTE — Assessment & Plan Note (Signed)
Stale, continue MiraLax.

## 2019-09-05 NOTE — Assessment & Plan Note (Signed)
Stable, continue Omeprazole 20mg qd.  

## 2019-09-05 NOTE — Assessment & Plan Note (Signed)
No urinary retention, continue Mirabegron.  

## 2019-09-05 NOTE — Assessment & Plan Note (Signed)
PVD/edema BLE, on Metolazone 2.5mg  3x/wk.

## 2019-09-05 NOTE — Assessment & Plan Note (Signed)
Anemia, stable, Hgb 11s,takes Fe

## 2019-09-05 NOTE — Assessment & Plan Note (Signed)
f/u neurology, last 07/18/19,on Sinemet.

## 2019-09-05 NOTE — Assessment & Plan Note (Signed)
Afib, heart rate is in control, on Xarelto 20mg qd, Diltiazem 120mg qd.08/09/19 Cardiology f/u.  

## 2019-09-05 NOTE — Assessment & Plan Note (Signed)
R hip pain, on Tylenol 1000mg tidprn, Methocarbamol 250mg qd.   

## 2019-09-05 NOTE — Progress Notes (Signed)
Location:   [facility] Room Number: 01-02-41 Place of Service:  SNF [phone]) Provider: Lennie Odor Pier Bosher NP  [name], MD  Patient Care Team: [name], MD as PCP - General (Internal Medicine) [name], [name], MD as PCP - Cardiology (Cardiology) [name], [name], DO as Consulting Physician (Neurology) Marielle Mantione X, NP as Nurse Practitioner (Internal Medicine)  Extended Emergency Contact Information Primary Emergency Contact: Lexia, Vandevender Mobile Phone: [phone] Relation: Daughter Interpreter needed? No Secondary Emergency Contact: [facility] Address: [address]          [place], [place] [address] [name] of [place] Phone: [phone] Mobile Phone: [phone] Relation: Son  Code Status: DNR Goals of care: Advanced Directive information Advanced Directives 08/12/2019  Does Patient Have a Medical Advance Directive? Yes  Type of Paramedic of [place];Out of facility DNR (pink MOST or yellow form)  Does patient want to make changes to medical advance directive? No - Patient declined  Copy of [place] in Chart? Yes - validated most recent copy scanned in chart (See row information)  Would patient like information on creating a medical advance directive? -  Pre-existing out of facility DNR order (yellow form or pink MOST form) -     Chief Complaint  Patient presents with  . Acute Visit    pain and difficulty inhaling.     HPI:  Pt is a 79 y.o. female seen today for an acute visit for pain in the right mid back at the tip of the scapular, no thoracic spinal spinous process tenderness, pain worsens with deep breathing, on set is acute onset this morning before getting out of bed, denied injury or fall, no O2 desaturation, she is afebrile, no noted cough, sore throat, or palpitation/chest pressure. The patient stated Tylenol helped.   Constipation, takes MiraLax             PVD/edema  BLE, on Metolazone 2.57m 3x/wk.  Afib, heart rate is in control, on Xarelto 253mqd, Diltiazem 12015md.08/09/19 Cardiology f/u. R hip pain, on Tylenol 1000m26mdprn, Methocarbamol 250mg51m              Parkinson's stable, f/u neurology, last 07/18/19,on Sinemet. Anemia, stable, Hgb 11s,takes Fe GERD, stable, on Omeprazole 20mg 36m            Urinary frequency, takes Mirabegron.      Past Medical History:  Diagnosis Date  . Acute lower UTI 09/14/2018  . Anemia 10/15/2018   2016 colonoscopy 10/14/18 wbc 5.4, Hgb 10.1, plt 184 12/01/18 wbc 4.5, Hgb 11.1, plt 188, neutrophils 63,  Na 139, K 3.7, Bun 23, creat 0.69, eGFR 83 on Fe, Hgb 11.7 12/09/18   . Atrial fibrillation (HCC) 5Carrizales2012   10/07/18 Na 143, K 4.0, Bun 20, creat 0.79, eGFR 72, wbc 7.1, Hgb 10.9, plt 172 10/28/18 Na 142, K 4.0, Bun 19, creat 0.77, eGFR 74 11/18/18 Na 144, K 4.1, Bun 21, creat 0.69, eGFR 83  . Bilateral lower extremity edema 07/19/2018   11/02/18 BMP 2 weeks.   . Cervical pain (neck) 06/20/2010  . Closed fracture of part of upper end of humerus 05/01/2015  . Colles' fracture of right radius 03/05/2015  . Constipation 07/19/2018  . DEGENERATIVE JOINT DISEASE, RIGHT HIP 02/16/2007  . Dupuytren's contracture   . Dysuria 09/20/2018   09/20/18 c/o got up several times last night to go urinate, burning on urination, lower abd /back discomfort, but  urinary frequency, leakage are not new. UA C/S, Pyridium 171m tid x 2 days.  09/21/18 wbc 6.1, Hgb 11.8, plt 210, neutrophil 69.4, Na 142, K 4.1, Bun 19, creat 0.78, TP 6.4, albumin 3.9  . Hematuria 02/04/2014  . Hypotension 09/18/2018  . Long term (current) use of anticoagulants 05/29/2016  . Osteoarthritis of hip    right  . Osteoarthritis of left knee   . Overactive bladder 10/06/2018  . Parkinson disease ([place]   . Paroxysmal A-fib ([facility]   . POSTMENOPAUSAL SYNDROME 02/16/2007  . PREMATURE ATRIAL CONTRACTIONS 02/16/2007  .  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 S/P breast biopsy, left    two o'clock position - benign  . 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 Tremor, unspecified 10/19/2015  . Unstable gait 02/16/2017  . Vaginal atrophy 10/19/2015  . Venous insufficiency   . Weakness 09/12/2018   Past Surgical History:  Procedure Laterality Date  . BREAST BIOPSY Left   . CATARACT EXTRACTION, BILATERAL      Allergies  Allergen Reactions  . Doxycycline Nausea And Vomiting  . Sulfonamide Derivatives     REACTION: HIVES    Allergies as of 09/05/2019      Reactions   Doxycycline Nausea And Vomiting   Sulfonamide Derivatives    REACTION: HIVES      Medication List       Accurate as of September 05, 2019  4:32 PM. If you have any questions, ask your nurse or doctor.        acetaminophen 500 MG tablet Commonly known as: TYLENOL Take 1,000 mg by mouth 3 (three) times daily as needed.   calcium carbonate 750 MG chewable tablet Commonly known as: TUMS EX Chew 1 tablet by mouth daily. Between the hours of 7am-10am.   carbidopa-levodopa 25-100 MG tablet Commonly known as: SINEMET IR Take 2 tablets by mouth 4 (four) times daily. Morning: 6:30 am, MidDay 10:30 am, Afternoon 2:30 pm, Evening 6:30 pm .   carbidopa-levodopa 50-200 MG tablet Commonly known as: SINEMET CR Take 1 tablet by mouth at bedtime. 9pm   diltiazem 120 MG 24 hr capsule Commonly known as: CARDIZEM CD Take 120 mg by mouth daily. Between the hours of 7am-10am. HOLD IF SBP < 100   ferrous sulfate 325 (65 FE) MG tablet Take 325 mg by mouth every Monday, Wednesday, and Friday. Give with food   hydrocortisone 2.5 % ointment Apply 1 application topically at bedtime. At bedtime for 1 week then change to PRN.   methocarbamol 500 MG tablet Commonly known as: ROBAXIN Take 250 mg by mouth daily. As needed   metolazone 2.5 MG tablet Commonly known as: ZAROXOLYN Take 2.5 mg by mouth every Monday, Wednesday, and Friday.  Hold if systolic blood pressure less than 100   Myrbetriq 50 MG Tb24 tablet Generic drug: mirabegron ER Take 50 mg by mouth daily. Between the hours of 7am-10am.   omeprazole 20 MG capsule Commonly known as: PRILOSEC Take 20 mg by mouth daily.   polyethylene glycol 17 g packet Commonly known as: MIRALAX / GLYCOLAX Take 17 g by mouth daily.   potassium chloride SA 20 MEQ tablet Commonly known as: KLOR-CON Take 20 mEq by mouth daily.   PreviDent 5000 Booster Plus 1.1 % Pste Generic drug: Sodium Fluoride Place 1 application onto teeth in the morning and at bedtime.   rivaroxaban 20 MG Tabs tablet Commonly known as: XARELTO Take 20 mg by mouth every evening. Between the hours of 5pm-6pm.  Vitamin D3 10 MCG (400 UNIT) Caps Take 1 capsule by mouth daily. Between the hours of 7am-10am.   zinc oxide 20 % ointment Apply 1 application topically as needed for irritation. To buttocks after every incontinent episode and as needed for redness. May keep at bedside.       Review of Systems  Constitutional: Negative for appetite change, fatigue and fever.  HENT: Positive for hearing loss. Negative for congestion and voice change.   Eyes: Negative for visual disturbance.  Respiratory: Negative for cough.   Cardiovascular: Positive for leg swelling.  Gastrointestinal: Negative for abdominal pain and constipation.  Genitourinary: Negative for dysuria, frequency and urgency.  Musculoskeletal: Positive for arthralgias, back pain and gait problem. Negative for myalgias.       Right hip pain is well controlled.   Skin: Positive for wound. Negative for color change.       Right lower leg skin tear is healed, indurated area is about the patient's palm size, no redness, warmth  Neurological: Positive for tremors. Negative for speech difficulty and headaches.       Moves slow, fine tremor in fingers, burning sensation  in the R+ L great toes when touched by sheets, comes/goes    Psychiatric/Behavioral: Negative for behavioral problems and sleep disturbance. The patient is not nervous/anxious.     Immunization History  Administered Date(s) Administered  . Influenza Split 10/20/2012  . Influenza Whole 10/21/2006  . Influenza, High Dose Seasonal PF 10/17/2016  . Influenza-Unspecified 10/20/2013, 10/22/2017  . Moderna SARS-COVID-2 Vaccination 01/22/2019, 02/19/2019  . Pneumococcal Conjugate-13 01/31/2013  . Pneumococcal Polysaccharide-23 01/20/2005, 11/24/2011  . Td 01/21/2004  . Tdap 03/16/2014  . Zoster 04/17/2009   Pertinent  Health Maintenance Due  Topic Date Due  . COLONOSCOPY  06/12/2019  . INFLUENZA VACCINE  08/21/2019  . DEXA SCAN  Completed  . PNA vac Low Risk Adult  Completed   Fall Risk  05/03/2018 11/03/2017 08/04/2017 03/06/2017 11/26/2016  Falls in the past year? 1 No No Yes Yes  Number falls in past yr: 0 - - 2 or more 2 or more  Injury with Fall? 0 - - No No  Risk Factor Category  - - - High Fall Risk High Fall Risk  Risk for fall due to : Other (Comment) - - - -  Follow up Falls evaluation completed;Education provided;Falls prevention discussed - - Falls evaluation completed Falls evaluation completed   Functional Status Survey:    Vitals:   09/05/19 1331  BP: 100/64  Pulse: 78  Resp: 20  Temp: (!) 97.2 F (36.2 C)  SpO2: 96%  Weight: 143 lb 1.6 oz (64.9 kg)  Height: '5\' 8"'  (1.727 m)   Body mass index is 21.76 kg/m. Physical Exam Vitals and nursing note reviewed.  Constitutional:      Appearance: Normal appearance.  HENT:     Head: Normocephalic and atraumatic.     Mouth/Throat:     Mouth: Mucous membranes are moist.  Eyes:     Extraocular Movements: Extraocular movements intact.     Conjunctiva/sclera: Conjunctivae normal.     Pupils: Pupils are equal, round, and reactive to light.  Cardiovascular:     Rate and Rhythm: Normal rate. Rhythm irregular.     Heart sounds: No murmur heard.      Comments: DP pulses present  L>R Pulmonary:     Breath sounds: No rales.  Abdominal:     General: Bowel sounds are normal.     Palpations: Abdomen is  soft.     Tenderness: There is no abdominal tenderness.  Musculoskeletal:     Cervical back: Normal range of motion and neck supple.     Right lower leg: Edema present.     Left lower leg: Edema present.     Comments: trace edema LLE, 1-2+ edema RLE. Right mid back pain in the tip of scapular ares, worsens with deep breathing, no thoracic spinal spinous process tenderness.   Skin:    General: Skin is warm and dry.     Findings: Bruising present. No erythema.     Comments: lateral right lower leg a healed skin tear area indurated about the patient's palm sized, no redness or warmth.    Neurological:     General: No focal deficit present.     Mental Status: She is alert and oriented to person, place, and time. Mental status is at baseline.     Motor: No weakness.     Coordination: Coordination abnormal.     Gait: Gait abnormal.     Comments: Moves slow, tremor in hands  Psychiatric:        Mood and Affect: Mood normal.        Behavior: Behavior normal.        Thought Content: Thought content normal.     Labs reviewed: Recent Labs    09/12/18 0304 09/12/18 0304 09/13/18 0201 09/13/18 0201 09/14/18 0043 09/21/18 0000 04/01/19 0000 05/17/19 0000 07/19/19 0000  NA 140   < > 140   < > 140   < > 140 140 138  K 3.6   < > 3.8   < > 3.9   < > 4.8 3.6 4.1  CL 108   < > 112*   < > 111   < > 104 103 104  CO2 21*   < > 22   < > 23   < > 31* 31* 29*  GLUCOSE 89  --  95  --  109*  --   --   --   --   BUN 27*   < > 24*   < > 15   < > 33* 22* 20  CREATININE 0.88   < > 0.75   < > 0.68   < > 1.0 0.7 0.7  CALCIUM 9.2   < > 8.4*   < > 8.5*   < > 9.3 9.4 8.9  MG  --   --  2.0  --   --   --   --   --   --    < > = values in this interval not displayed.   Recent Labs    09/12/18 0304 09/12/18 0304 09/13/18 0201 09/13/18 0201 09/21/18 0000 12/09/18 0000  03/24/19 0000 04/01/19 0000  AST 60*   < > 50*   < > 20 16 11* 12*  ALT 7   < > 7   < > 20 10 5*  --   ALKPHOS 45   < > 36*   < > 42 47 50 48  BILITOT 1.1  --  0.6  --   --   --   --   --   PROT 6.2*  --  4.9*  --  6.4  --   --   --   ALBUMIN 3.7   < > 2.8*   < > 3.9 3.8 4.0 3.8   < > = values in this interval not displayed.   Recent Labs  09/11/18 2105 09/11/18 2105 09/12/18 0304 09/12/18 0304 09/13/18 0201 09/21/18 0000 12/09/18 0000 12/09/18 0000 03/24/19 0000 04/01/19 0000 04/07/19 0000 04/14/19 0000 05/17/19 0000  WBC 10.6*   < > 8.0   < > 8.9   < > 5.5   < > 8.4   < > 6.6 5.9 5.2  NEUTROABS 9.4*   < >  --   --  7.1  --  3,718  --  6,602  --   --   --   --   HGB 12.6   < > 11.5*   < > 10.5*   < > 11.7*   < > 10.9*   < > 9.3* 9.7* 11.4*  HCT 40.1   < > 35.2*   < > 32.4*   < > 37   < > 34*   < > 29* 30* 34*  MCV 96.4  --  94.4  --  93.9  --   --   --   --   --   --   --   --   PLT 170   < > 150   < > 125*   < > 189   < > 190   < > 199 192 184   < > = values in this interval not displayed.   Lab Results  Component Value Date   TSH 0.408 09/11/2018   No results found for: HGBA1C Lab Results  Component Value Date   CHOL 185 03/24/2019   HDL 79 (A) 03/24/2019   LDLCALC 94 03/24/2019   LDLDIRECT 104.2 01/25/2013   TRIG 41 03/24/2019   CHOLHDL 2 08/29/2015    Significant Diagnostic Results in last 30 days:  No results found.  Assessment/Plan: Acute right-sided thoracic back pain  pain in the right mid back at the tip of the scapular, no thoracic spinal spinous process tenderness, pain worsens with deep breathing, on set is acute onset this morning before getting out of bed, denied injury or fall, no O2 desaturation, she is afebrile, no noted cough, sore throat, or palpitation/chest pressure.  Will obtain X-ray chest, right ribs, thoracic spine. Update CBC/diff, CMP/eGFR  Constipation Stale, continue MiraLax.   Bilateral lower extremity edema PVD/edema BLE,  on Metolazone 2.69m 3x/wk.    Atrial fibrillation (HCC) Afib, heart rate is in control, on Xarelto 237mqd, Diltiazem 12082md.08/09/19 Cardiology f/u.   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  Anemia Anemia, stable, Hgb 11s,takes Fe   GERD (gastroesophageal reflux disease) Stable, continue Omeprazole 20mg 31m  Overactive bladder No urinary retention, continue Mirabegron    Family/ staff Communication: plan of care reviewed with the patient and charge nurse.   Labs/tests ordered:  X-ray right ribs, thoracic spine, and chest. CBC/diff, CMP/eGFR

## 2019-09-06 ENCOUNTER — Encounter: Payer: Self-pay | Admitting: Internal Medicine

## 2019-09-06 ENCOUNTER — Non-Acute Institutional Stay (SKILLED_NURSING_FACILITY): Payer: Medicare PPO | Admitting: Internal Medicine

## 2019-09-06 DIAGNOSIS — G20 Parkinson's disease: Secondary | ICD-10-CM | POA: Diagnosis not present

## 2019-09-06 DIAGNOSIS — D1801 Hemangioma of skin and subcutaneous tissue: Secondary | ICD-10-CM | POA: Diagnosis not present

## 2019-09-06 DIAGNOSIS — N3281 Overactive bladder: Secondary | ICD-10-CM | POA: Diagnosis not present

## 2019-09-06 DIAGNOSIS — M546 Pain in thoracic spine: Secondary | ICD-10-CM

## 2019-09-06 DIAGNOSIS — L570 Actinic keratosis: Secondary | ICD-10-CM | POA: Diagnosis not present

## 2019-09-06 DIAGNOSIS — T148XXA Other injury of unspecified body region, initial encounter: Secondary | ICD-10-CM | POA: Diagnosis not present

## 2019-09-06 DIAGNOSIS — G20C Parkinsonism, unspecified: Secondary | ICD-10-CM

## 2019-09-06 DIAGNOSIS — D649 Anemia, unspecified: Secondary | ICD-10-CM

## 2019-09-06 DIAGNOSIS — I10 Essential (primary) hypertension: Secondary | ICD-10-CM | POA: Diagnosis not present

## 2019-09-06 DIAGNOSIS — I4819 Other persistent atrial fibrillation: Secondary | ICD-10-CM

## 2019-09-06 DIAGNOSIS — L820 Inflamed seborrheic keratosis: Secondary | ICD-10-CM | POA: Diagnosis not present

## 2019-09-06 DIAGNOSIS — R600 Localized edema: Secondary | ICD-10-CM | POA: Diagnosis not present

## 2019-09-06 DIAGNOSIS — S80811A Abrasion, right lower leg, initial encounter: Secondary | ICD-10-CM | POA: Diagnosis not present

## 2019-09-06 DIAGNOSIS — D692 Other nonthrombocytopenic purpura: Secondary | ICD-10-CM | POA: Diagnosis not present

## 2019-09-06 DIAGNOSIS — L814 Other melanin hyperpigmentation: Secondary | ICD-10-CM | POA: Diagnosis not present

## 2019-09-06 DIAGNOSIS — K5900 Constipation, unspecified: Secondary | ICD-10-CM | POA: Diagnosis not present

## 2019-09-06 LAB — BASIC METABOLIC PANEL
BUN: 16 (ref 4–21)
CO2: 29 — AB (ref 13–22)
Chloride: 103 (ref 99–108)
Creatinine: 0.8 (ref 0.5–1.1)
Glucose: 85
Potassium: 4 (ref 3.4–5.3)
Sodium: 139 (ref 137–147)

## 2019-09-06 LAB — HEPATIC FUNCTION PANEL
ALT: 3 — AB (ref 7–35)
AST: 9 — AB (ref 13–35)
Alkaline Phosphatase: 62 (ref 25–125)
Bilirubin, Total: 0.6

## 2019-09-06 LAB — CBC AND DIFFERENTIAL
HCT: 36 (ref 36–46)
Hemoglobin: 11.7 — AB (ref 12.0–16.0)
Neutrophils Absolute: 3450
Platelets: 181 (ref 150–399)
WBC: 4.9

## 2019-09-06 LAB — COMPREHENSIVE METABOLIC PANEL
Albumin: 3.8 (ref 3.5–5.0)
GFR calc Af Amer: 71
GFR calc non Af Amer: 61

## 2019-09-06 LAB — CBC: RBC: 3.89 (ref 3.87–5.11)

## 2019-09-06 NOTE — Progress Notes (Signed)
Location:  [place] Room Number: 13 Place of Service:  SNF (31)  Provider:   Code Status:  Goals of Care:  Advanced Directives 08/12/2019  Does Patient Have a Medical Advance Directive? Yes  Type of Paramedic of [place];Out of facility DNR (pink MOST or yellow form)  Does patient want to make changes to medical advance directive? No - Patient declined  Copy of [place] in Chart? Yes - validated most recent copy scanned in chart (See row information)  Would patient like information on creating a medical advance directive? -  Pre-existing out of facility DNR order (yellow form or pink MOST form) -     Chief Complaint  Patient presents with  . Medical Management of Chronic Issues  . Health Maintenance    Hep C screen, Colonoscopy, influenza    HPI: Patient is a 79 y.o. female seen today for medical management of chronic diseases.    Patient has a history of Parkinson disease diagnosed 2 years ago on Sinemet and follows withNeurology, paroxysmal A. fib on Xarelto,  hyperlipidemia, H/o Ovarian Cyst, Lower extremity edema,2 D echo with N EF Biatrial enlargementand Urinary Incontinence Has Chronic Right Hip Pain. 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  Patient is doing well today. Per patient yesterday she had a severe pain in her back. Was seen by [facility]. Had multiple x-rays done. All of them were negative. The chest x-ray showed some questionable left lower lobe infiltrate. Patient just finished antibiotics for her cellulitis. She has not had any fever chills coughing or shortness of breath. Her pain in the back is also better today. She thinks she pulled her muscle. She also continues to have some swelling in her right lower leg. Possible hematoma. Patient states she does not have much pain but it is tender. Past Medical History:  Diagnosis Date  .  Acute lower UTI 09/14/2018  . Anemia 10/15/2018   2016 colonoscopy 10/14/18 wbc 5.4, Hgb 10.1, plt 184 12/01/18 wbc 4.5, Hgb 11.1, plt 188, neutrophils 63,  Na 139, K 3.7, Bun 23, creat 0.69, eGFR 83 on Fe, Hgb 11.7 12/09/18   . Atrial fibrillation ([place]) 05/21/2010   10/07/18 Na 143, K 4.0, Bun 20, creat 0.79, eGFR 72, wbc 7.1, Hgb 10.9, plt 172 10/28/18 Na 142, K 4.0, Bun 19, creat 0.77, eGFR 74 11/18/18 Na 144, K 4.1, Bun 21, creat 0.69, eGFR 83  . Bilateral lower extremity edema 07/19/2018   11/02/18 BMP 2 weeks.   . Cervical pain (neck) 06/20/2010  . Closed fracture of part of upper end of humerus 05/01/2015  . Colles' fracture of right radius 03/05/2015  . Constipation 07/19/2018  . DEGENERATIVE JOINT DISEASE, RIGHT HIP 02/16/2007  . Dupuytren's contracture   . Dysuria 09/20/2018   09/20/18 c/o got up several times last night to go urinate, burning on urination, lower abd /back discomfort, but urinary frequency, leakage are not new. UA C/S, Pyridium 116m tid x 2 days.  09/21/18 wbc 6.1, Hgb 11.8, plt 210, neutrophil 69.4, Na 142, K 4.1, Bun 19, creat 0.78, TP 6.4, albumin 3.9  . Hematuria 02/04/2014  . Hypotension 09/18/2018  . Long term (current) use of anticoagulants 05/29/2016  . Osteoarthritis of hip    right  . Osteoarthritis of left knee   . Overactive bladder 10/06/2018  . [place] disease [place]   . [place]   . POSTMENOPAUSAL SYNDROME  02/16/2007  . PREMATURE ATRIAL CONTRACTIONS 02/16/2007  . 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 S/P breast biopsy, left    two o'clock position - benign  . 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 Tremor, unspecified 10/19/2015  . Unstable gait 02/16/2017  . Vaginal atrophy 10/19/2015  . Venous insufficiency   . Weakness 09/12/2018    Past Surgical History:  Procedure Laterality Date  . BREAST BIOPSY Left   . CATARACT EXTRACTION, BILATERAL      Allergies  Allergen Reactions  . Doxycycline Nausea And Vomiting  . Sulfonamide  Derivatives     REACTION: HIVES    Outpatient Encounter Medications as of 09/06/2019  Medication Sig  . acetaminophen (TYLENOL) 500 MG tablet Take 1,000 mg by mouth 3 (three) times daily as needed.   . calcium carbonate (TUMS EX) 750 MG chewable tablet Chew 1 tablet by mouth daily. Between the hours of 7am-10am.  . carbidopa-levodopa (SINEMET CR) 50-200 MG tablet Take 1 tablet by mouth at bedtime. 9pm  . carbidopa-levodopa (SINEMET IR) 25-100 MG tablet Take 2 tablets by mouth 4 (four) times daily. Morning: 6:30 am, MidDay 10:30 am, Afternoon 2:30 pm, Evening 6:30 pm .  . Cholecalciferol (VITAMIN D3) 10 MCG (400 UNIT) CAPS Take 1 capsule by mouth daily. Between the hours of 7am-10am.  . diltiazem (CARDIZEM CD) 120 MG 24 hr capsule Take 120 mg by mouth daily. Between the hours of 7am-10am. HOLD IF SBP < 100  . ferrous sulfate 325 (65 FE) MG tablet Take 325 mg by mouth every Monday, Wednesday, and Friday. Give with food  . hydrocortisone 2.5 % ointment Apply 1 application topically at bedtime. At bedtime for 1 week then change to PRN.  . methocarbamol (ROBAXIN) 500 MG tablet Take 250 mg by mouth daily. As needed  . metolazone (ZAROXOLYN) 2.5 MG tablet Take 2.5 mg by mouth every Monday, Wednesday, and Friday. Hold if systolic blood pressure less than 100  . mirabegron ER (MYRBETRIQ) 50 MG TB24 tablet Take 50 mg by mouth daily. Between the hours of 7am-10am.  . omeprazole (PRILOSEC) 20 MG capsule Take 20 mg by mouth daily.  . polyethylene glycol (MIRALAX / GLYCOLAX) 17 g packet Take 17 g by mouth daily.  . potassium chloride SA (KLOR-CON) 20 MEQ tablet Take 20 mEq by mouth daily.  . rivaroxaban (XARELTO) 20 MG TABS tablet Take 20 mg by mouth every evening. Between the hours of 5pm-6pm.  . Sodium Fluoride (PREVIDENT 5000 BOOSTER PLUS) 1.1 % PSTE Place 1 application onto teeth in the morning and at bedtime.  Marland Kitchen zinc oxide 20 % ointment Apply 1 application topically as needed for irritation. To  buttocks after every incontinent episode and as needed for redness. May keep at bedside.   No facility-administered encounter medications on file as of 09/06/2019.    Review of Systems:  Review of Systems  Constitutional: Positive for unexpected weight change.  HENT: Negative.   Respiratory: Negative.   Cardiovascular: Positive for leg swelling.  Gastrointestinal: Negative.   Genitourinary: Negative.   Musculoskeletal: Positive for gait problem.  Skin: Negative.   Neurological: Positive for weakness.  Psychiatric/Behavioral: Negative.     Health Maintenance  Topic Date Due  . Hepatitis C Screening  Never done  . COLONOSCOPY  06/12/2019  . INFLUENZA VACCINE  08/21/2019  . TETANUS/TDAP  03/16/2024  . DEXA SCAN  Completed  . COVID-19 Vaccine  Completed  . PNA vac Low Risk Adult  Completed    Physical Exam:  Vitals:   09/06/19 1546  BP: 110/68  Pulse: 76  Resp: 20  Temp: 97.7 F (36.5 C)  SpO2: 95%  Weight: 143 lb 1.6 oz (64.9 kg)  Height: '5\' 8"'  (1.727 m)   Body mass index is 21.76 kg/m. Physical Exam Vitals reviewed.  Constitutional:      Appearance: Normal appearance.  HENT:     Head: Normocephalic.     Nose: Nose normal.     Mouth/Throat:     Mouth: Mucous membranes are moist.     Pharynx: Oropharynx is clear.  Eyes:     Pupils: Pupils are equal, round, and reactive to light.  Cardiovascular:     Rate and Rhythm: Normal rate.     Pulses: Normal pulses.     Heart sounds: Murmur heard.   Pulmonary:     Effort: Pulmonary effort is normal. No respiratory distress.     Breath sounds: Normal breath sounds. No wheezing or rales.  Abdominal:     General: Abdomen is flat. Bowel sounds are normal.     Palpations: Abdomen is soft.  Musculoskeletal:        General: Swelling present.     Cervical back: Neck supple.     Comments: Has small area in Right LE Tender but not red or warm  Skin:    General: Skin is warm.  Neurological:     General: No focal deficit  present.     Mental Status: She is alert and oriented to person, place, and time.  Psychiatric:        Mood and Affect: Mood normal.     Labs reviewed: Basic Metabolic Panel: Recent Labs    09/11/18 2105 09/11/18 2105 09/12/18 0304 09/12/18 0304 09/13/18 0201 09/13/18 0201 09/14/18 0043 09/21/18 0000 04/01/19 0000 05/17/19 0000 07/19/19 0000  NA 139   < > 140   < > 140   < > 140   < > 140 140 138  K 3.7   < > 3.6   < > 3.8   < > 3.9   < > 4.8 3.6 4.1  CL 106   < > 108   < > 112*   < > 111   < > 104 103 104  CO2 22   < > 21*   < > 22   < > 23   < > 31* 31* 29*  GLUCOSE 99   < > 89  --  95  --  109*  --   --   --   --   BUN 29*   < > 27*   < > 24*   < > 15   < > 33* 22* 20  CREATININE 0.98   < > 0.88   < > 0.75   < > 0.68   < > 1.0 0.7 0.7  CALCIUM 9.9   < > 9.2   < > 8.4*   < > 8.5*   < > 9.3 9.4 8.9  MG  --   --   --   --  2.0  --   --   --   --   --   --   TSH 0.408  --   --   --   --   --   --   --   --   --   --    < > = values in this interval not displayed.   Liver Function Tests: Recent Labs  09/12/18 0304 09/12/18 0304 09/13/18 0201 09/13/18 0201 09/21/18 0000 12/09/18 0000 03/24/19 0000 04/01/19 0000  AST 60*   < > 50*   < > 20 16 11* 12*  ALT 7   < > 7   < > 20 10 5*  --   ALKPHOS 45   < > 36*   < > 42 47 50 48  BILITOT 1.1  --  0.6  --   --   --   --   --   PROT 6.2*  --  4.9*  --  6.4  --   --   --   ALBUMIN 3.7   < > 2.8*   < > 3.9 3.8 4.0 3.8   < > = values in this interval not displayed.   No results for input(s): LIPASE, AMYLASE in the last 8760 hours. No results for input(s): AMMONIA in the last 8760 hours. CBC: Recent Labs    09/11/18 2105 09/11/18 2105 09/12/18 0304 09/12/18 0304 09/13/18 0201 09/21/18 0000 12/09/18 0000 12/09/18 0000 03/24/19 0000 04/01/19 0000 04/07/19 0000 04/14/19 0000 05/17/19 0000  WBC 10.6*   < > 8.0   < > 8.9   < > 5.5   < > 8.4   < > 6.6 5.9 5.2  NEUTROABS 9.4*   < >  --   --  7.1  --  3,718  --   6,602  --   --   --   --   HGB 12.6   < > 11.5*   < > 10.5*   < > 11.7*   < > 10.9*   < > 9.3* 9.7* 11.4*  HCT 40.1   < > 35.2*   < > 32.4*   < > 37   < > 34*   < > 29* 30* 34*  MCV 96.4  --  94.4  --  93.9  --   --   --   --   --   --   --   --   PLT 170   < > 150   < > 125*   < > 189   < > 190   < > 199 192 184   < > = values in this interval not displayed.   Lipid Panel: Recent Labs    12/09/18 0000 03/24/19 0000  CHOL 185 185  HDL 78* 79*  LDLCALC 94 94  TRIG 45 41   No results found for: HGBA1C  Procedures since last visit: No results found.  Assessment/Plan Acute right-sided thoracic back pain Pain resolved Xrays Done in Facility were all normal Did have ? Left lower Infiltrate No signs of Pneumonia Will not treat yet with Antibiotics Continue to monitor  Bilateral lower extremity edema Does not tolerate lasix due to dizzines Continue Metalazone Bun and Creat Normal levels Persistent atrial fibrillation (HCC) On Xarelto and Cardizem BP low but stable Constipation, unspecified constipation type On Miralax Parkinsonism, unspecified Parkinsonism type ([place]) Sinemet And Follows with Neurology Overactive bladder Myrebetriq Anemia, unspecified type Hgb 11.7 tody and WBC 4.9 Possible GI Consult for Follow up Colonoscopy Hematoma/ Abscess If not better consdier [place] H/O Right Hip Pain Pain Seems to be resolved right now   Labs/tests ordered:  * No order type specified * Next appt:  Visit date not found

## 2019-09-08 ENCOUNTER — Encounter: Payer: Self-pay | Admitting: Nurse Practitioner

## 2019-09-08 ENCOUNTER — Non-Acute Institutional Stay (SKILLED_NURSING_FACILITY): Payer: Medicare PPO | Admitting: Nurse Practitioner

## 2019-09-08 DIAGNOSIS — R300 Dysuria: Secondary | ICD-10-CM

## 2019-09-08 DIAGNOSIS — M159 Polyosteoarthritis, unspecified: Secondary | ICD-10-CM | POA: Diagnosis not present

## 2019-09-08 DIAGNOSIS — K219 Gastro-esophageal reflux disease without esophagitis: Secondary | ICD-10-CM

## 2019-09-08 DIAGNOSIS — B351 Tinea unguium: Secondary | ICD-10-CM | POA: Diagnosis not present

## 2019-09-08 DIAGNOSIS — N3281 Overactive bladder: Secondary | ICD-10-CM | POA: Diagnosis not present

## 2019-09-08 DIAGNOSIS — K5900 Constipation, unspecified: Secondary | ICD-10-CM

## 2019-09-08 DIAGNOSIS — D649 Anemia, unspecified: Secondary | ICD-10-CM

## 2019-09-08 DIAGNOSIS — G20 Parkinson's disease: Secondary | ICD-10-CM | POA: Diagnosis not present

## 2019-09-08 DIAGNOSIS — I4819 Other persistent atrial fibrillation: Secondary | ICD-10-CM | POA: Diagnosis not present

## 2019-09-08 DIAGNOSIS — I872 Venous insufficiency (chronic) (peripheral): Secondary | ICD-10-CM

## 2019-09-08 DIAGNOSIS — N390 Urinary tract infection, site not specified: Secondary | ICD-10-CM | POA: Diagnosis not present

## 2019-09-08 NOTE — Assessment & Plan Note (Signed)
W/c bond, no worsening in tremors, not disabling, continue Sinemet, f/u Neurology-last seen 07/18/19

## 2019-09-08 NOTE — Assessment & Plan Note (Signed)
Trace, TED, continue Metolazone.

## 2019-09-08 NOTE — Assessment & Plan Note (Signed)
Stable, continue MiraLax.  °

## 2019-09-08 NOTE — Assessment & Plan Note (Signed)
Stable, continue Omeprazole.  

## 2019-09-08 NOTE — Assessment & Plan Note (Signed)
Stable, Hgb 11s, continue Fe

## 2019-09-08 NOTE — Progress Notes (Signed)
Location:   [facility] Room Number: 13 Place of Service:  SNF (31) Provider: Lennie Odor Gabrien Mentink NP  [name], MD  Patient Care Team: [name], MD as PCP - General (Internal Medicine) [name], [name], MD as PCP - Cardiology (Cardiology) [name], [name], DO as Consulting Physician (Neurology) Jmya Uliano X, NP as Nurse Practitioner (Internal Medicine)  Extended Emergency Contact Information Primary Emergency Contact: Abbygayle, [place] Mobile Phone: [phone] Relation: Daughter Interpreter needed? No Secondary Emergency Contact: [facility] Address: [address]          [place], [place] [address] [name] of [place] Phone: [phone] Mobile Phone: [phone] Relation: Son  Code Status: DNR Goals of care: Advanced Directive information Advanced Directives 08/12/2019  Does Patient Have a Medical Advance Directive? Yes  Type of Paramedic of [place];Out of facility DNR (pink MOST or yellow form)  Does patient want to make changes to medical advance directive? No - Patient declined  Copy of [place] in Chart? Yes - validated most recent copy scanned in chart (See row information)  Would patient like information on creating a medical advance directive? -  Pre-existing out of facility DNR order (yellow form or pink MOST form) -     Chief Complaint  Patient presents with   Acute Visit    UTI    HPI:  Pt is a 79 y.o. female seen today for an acute visit for burning sensation upon urination, increased urinary frequency, denied urinary urgency, suprapubic tenderness, nausea, vomiting, she is afebrile   Right great toe nail, yellow, thick, desires home remedy.    Constipation, takes MiraLax PVD/edema BLE, on Metolazone 2.61m 3x/wk.  Afib, heart rate is in control, on Xarelto 26mqd, Diltiazem 1207md.08/09/19 Cardiology f/u. R hip pain, on Tylenol  1000m49mdprn, Methocarbamol 250mg35m  Parkinson's stable, f/u neurology, last 07/18/19,on Sinemet. Anemia, stable, Hgb 11s,takes Fe GERD, stable, on Omeprazole 20mg 48mUrinary frequency, takes Mirabegron.     Past Medical History:  Diagnosis Date   Acute lower UTI 09/14/2018   Anemia 10/15/2018   2016 colonoscopy 10/14/18 wbc 5.4, Hgb 10.1, plt 184 12/01/18 wbc 4.5, Hgb 11.1, plt 188, neutrophils 63,  Na 139, K 3.7, Bun 23, creat 0.69, eGFR 83 on Fe, Hgb 11.7 12/09/18    Atrial fibrillation (HCC) [id number]   10/07/18 Na 143, K 4.0, Bun 20, creat 0.79, eGFR 72, wbc 7.1, Hgb 10.9, plt 172 10/28/18 Na 142, K 4.0, Bun 19, creat 0.77, eGFR 74 11/18/18 Na 144, K 4.1, Bun 21, creat 0.69, eGFR 83   Bilateral lower extremity edema 07/19/2018   11/02/18 BMP 2 weeks.    Cervical pain (neck) 06/20/2010   Closed fracture of part of upper end of humerus 05/01/2015   Colles' fracture of right radius 03/05/2015   Constipation 07/19/2018   DEGENERATIVE JOINT DISEASE, RIGHT HIP 02/16/2007   Dupuytren's contracture    Dysuria 09/20/2018   09/20/18 c/o got up several times last night to go urinate, burning on urination, lower abd /back discomfort, but urinary frequency, leakage are not new. UA C/S, Pyridium 100mg t54m 2 days.  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   S/P breast biopsy, left    two o'clock position - benign   Toxic effect of venom(989.5) 07/27/2007   Tremor, unspecified  10/19/2015   Unstable gait 02/16/2017   Vaginal atrophy 10/19/2015   Venous insufficiency    Weakness 09/12/2018   Past Surgical History:  Procedure Laterality Date   BREAST BIOPSY Left    CATARACT EXTRACTION, BILATERAL      Allergies  Allergen Reactions   Doxycycline Nausea And Vomiting   Sulfonamide Derivatives     REACTION: HIVES    Allergies as of 09/08/2019      Reactions   Doxycycline Nausea And Vomiting   Sulfonamide Derivatives    REACTION: HIVES      Medication List       Accurate as of September 08, 2019 11:54 AM. If you have any questions, ask your nurse or doctor.        acetaminophen 500 MG tablet Commonly known as: TYLENOL Take 1,000 mg by mouth 3 (three) times daily as needed.   calcium carbonate 750 MG chewable tablet Commonly known as: TUMS EX Chew 1 tablet by mouth daily. Between the hours of 7am-10am.   carbidopa-levodopa 25-100 MG tablet Commonly known as: SINEMET IR Take 2 tablets by mouth 4 (four) times daily. Morning: 6:30 am, MidDay 10:30 am, Afternoon 2:30 pm, Evening 6:30 pm .   carbidopa-levodopa 50-200 MG tablet Commonly known as: SINEMET CR Take 1 tablet by mouth at bedtime. 9pm   diltiazem 120 MG 24 hr capsule Commonly known as: CARDIZEM CD Take 120 mg by mouth daily. Between the hours of 7am-10am. HOLD IF SBP < 100   ferrous sulfate 325 (65 FE) MG tablet Take 325 mg by mouth every Monday, Wednesday, and Friday. Give with food   hydrocortisone 2.5 % ointment Apply 1 application topically at bedtime. At bedtime for 1 week then change to PRN.   methocarbamol 500 MG tablet Commonly known as: ROBAXIN Take 250 mg by mouth daily. As needed   metolazone 2.5 MG tablet Commonly known as: ZAROXOLYN Take 2.5 mg by mouth every Monday, Wednesday, and Friday. Hold if systolic blood pressure less than 100   Myrbetriq 50 MG Tb24 tablet Generic drug: mirabegron ER Take 50 mg by mouth daily. Between the hours of 7am-10am.     omeprazole 20 MG capsule Commonly known as: PRILOSEC Take 20 mg by mouth daily.   polyethylene glycol 17 g packet Commonly known as: MIRALAX / GLYCOLAX Take 17 g by mouth daily.   potassium chloride SA 20 MEQ tablet Commonly known as: KLOR-CON Take 20 mEq by mouth daily.   PreviDent 5000 Booster Plus 1.1 % Pste Generic drug: Sodium Fluoride Place 1 application onto teeth in the morning and at bedtime.   rivaroxaban 20 MG Tabs tablet Commonly known as: XARELTO Take 20 mg by mouth every evening. Between the hours of 5pm-6pm.   Vitamin D3 10 MCG (400 UNIT) Caps Take 1 capsule by mouth daily. Between the hours of 7am-10am.   zinc oxide 20 % ointment Apply 1 application topically as needed for irritation. To buttocks after every incontinent episode and as needed for redness. May keep at bedside.       Review of Systems  Constitutional: Negative for appetite change, fatigue and fever.  HENT: Positive for  hearing loss. Negative for congestion and voice change.   Eyes: Negative for visual disturbance.  Respiratory: Negative for cough.   Cardiovascular: Positive for leg swelling.  Gastrointestinal: Negative for abdominal pain and constipation.  Genitourinary: Positive for dysuria and frequency. Negative for urgency.  Musculoskeletal: Positive for arthralgias and gait problem. Negative for myalgias.       Right hip pain is well controlled.   Skin: Positive for wound. Negative for color change.       Right lower leg skin tear is healed, indurated area is about the patient's palm size, no redness, warmth. Thick yellow right great toe nail.   Neurological: Positive for tremors. Negative for speech difficulty and light-headedness.       Moves slow, fine tremor in fingers, burning sensation  in the R+ L great toes when touched by sheets, comes/goes  Psychiatric/Behavioral: Negative for behavioral problems and sleep disturbance. The patient is not nervous/anxious.     Immunization  History  Administered Date(s) Administered   Influenza Split 10/20/2012   Influenza Whole 10/21/2006   Influenza, High Dose Seasonal PF 10/17/2016   Influenza-Unspecified 10/20/2013, 10/22/2017   Moderna SARS-COVID-2 Vaccination 01/22/2019, 02/19/2019   Pneumococcal Conjugate-13 01/31/2013   Pneumococcal Polysaccharide-23 01/20/2005, 11/24/2011   Td 01/21/2004   Tdap 03/16/2014   Zoster 04/17/2009   Pertinent  Health Maintenance Due  Topic Date Due   COLONOSCOPY  06/12/2019   INFLUENZA VACCINE  08/21/2019   DEXA SCAN  Completed   PNA vac Low Risk Adult  Completed   Fall Risk  05/03/2018 11/03/2017 08/04/2017 03/06/2017 11/26/2016  Falls in the past year? 1 No No Yes Yes  Number falls in past yr: 0 - - 2 or more 2 or more  Injury with Fall? 0 - - No No  Risk Factor Category  - - - High Fall Risk High Fall Risk  Risk for fall due to : Other (Comment) - - - -  Follow up Falls evaluation completed;Education provided;Falls prevention discussed - - Falls evaluation completed Falls evaluation completed   Functional Status Survey:    Vitals:   09/08/19 1122  BP: 100/64  Pulse: 76  Resp: 20  Temp: (!) 97.4 F (36.3 C)  SpO2: 95%  Weight: 144 lb 3.2 oz (65.4 kg)  Height: $Remove'5\' 8"'DEhVdad$  (1.727 m)   Body mass index is 21.93 kg/m. Physical Exam Vitals and nursing note reviewed.  Constitutional:      Appearance: Normal appearance.  HENT:     Head: Normocephalic and atraumatic.     Mouth/Throat:     Mouth: Mucous membranes are moist.  Eyes:     Extraocular Movements: Extraocular movements intact.     Conjunctiva/sclera: Conjunctivae normal.     Pupils: Pupils are equal, round, and reactive to light.  Cardiovascular:     Rate and Rhythm: Normal rate. Rhythm irregular.     Heart sounds: No murmur heard.      Comments: DP pulses present L>R Pulmonary:     Breath sounds: No rales.  Abdominal:     General: Bowel sounds are normal.     Palpations: Abdomen is soft.      Tenderness: There is no abdominal tenderness. There is no right CVA tenderness, left CVA tenderness, guarding or rebound.  Musculoskeletal:     Cervical back: Normal range of motion and neck supple.     Right lower leg: Edema present.     Left lower leg: Edema present.     Comments: trace edema LLE,  1-2+ edema RLE. Right mid back pain in the tip of scapular ares, worsens with deep breathing, no thoracic spinal spinous process tenderness.   Skin:    General: Skin is warm and dry.     Findings: Bruising present. No erythema.     Comments: lateral right lower leg a healed skin tear area indurated about the patient's palm sized, no redness or warmth.  Yellow thick right great toe nail  Neurological:     General: No focal deficit present.     Mental Status: She is alert and oriented to person, place, and time. Mental status is at baseline.     Motor: No weakness.     Coordination: Coordination abnormal.     Gait: Gait abnormal.     Comments: Moves slow, tremor in hands  Psychiatric:        Mood and Affect: Mood normal.        Behavior: Behavior normal.        Thought Content: Thought content normal.     Labs reviewed: Recent Labs    09/12/18 0304 09/12/18 0304 09/13/18 0201 09/13/18 0201 09/14/18 0043 09/21/18 0000 04/01/19 0000 05/17/19 0000 07/19/19 0000  NA 140   < > 140   < > 140   < > 140 140 138  K 3.6   < > 3.8   < > 3.9   < > 4.8 3.6 4.1  CL 108   < > 112*   < > 111   < > 104 103 104  CO2 21*   < > 22   < > 23   < > 31* 31* 29*  GLUCOSE 89  --  95  --  109*  --   --   --   --   BUN 27*   < > 24*   < > 15   < > 33* 22* 20  CREATININE 0.88   < > 0.75   < > 0.68   < > 1.0 0.7 0.7  CALCIUM 9.2   < > 8.4*   < > 8.5*   < > 9.3 9.4 8.9  MG  --   --  2.0  --   --   --   --   --   --    < > = values in this interval not displayed.   Recent Labs    09/12/18 0304 09/12/18 0304 09/13/18 0201 09/13/18 0201 09/21/18 0000 12/09/18 0000 03/24/19 0000 04/01/19 0000  AST  60*   < > 50*   < > 20 16 11* 12*  ALT 7   < > 7   < > 20 10 5*  --   ALKPHOS 45   < > 36*   < > 42 47 50 48  BILITOT 1.1  --  0.6  --   --   --   --   --   PROT 6.2*  --  4.9*  --  6.4  --   --   --   ALBUMIN 3.7   < > 2.8*   < > 3.9 3.8 4.0 3.8   < > = values in this interval not displayed.   Recent Labs    09/11/18 2105 09/11/18 2105 09/12/18 0304 09/12/18 0304 09/13/18 0201 09/21/18 0000 12/09/18 0000 12/09/18 0000 03/24/19 0000 04/01/19 0000 04/07/19 0000 04/14/19 0000 05/17/19 0000  WBC 10.6*   < > 8.0   < > 8.9   < > 5.5   < >  8.4   < > 6.6 5.9 5.2  NEUTROABS 9.4*   < >  --   --  7.1  --  3,718  --  6,602  --   --   --   --   HGB 12.6   < > 11.5*   < > 10.5*   < > 11.7*   < > 10.9*   < > 9.3* 9.7* 11.4*  HCT 40.1   < > 35.2*   < > 32.4*   < > 37   < > 34*   < > 29* 30* 34*  MCV 96.4  --  94.4  --  93.9  --   --   --   --   --   --   --   --   PLT 170   < > 150   < > 125*   < > 189   < > 190   < > 199 192 184   < > = values in this interval not displayed.   Lab Results  Component Value Date   TSH 0.408 09/11/2018   No results found for: HGBA1C Lab Results  Component Value Date   CHOL 185 03/24/2019   HDL 79 (A) 03/24/2019   LDLCALC 94 03/24/2019   LDLDIRECT 104.2 01/25/2013   TRIG 41 03/24/2019   CHOLHDL 2 08/29/2015    Significant Diagnostic Results in last 30 days:  No results found.  Assessment/Plan: Dysuria  burning sensation upon urination, increased urinary frequency, denied urinary urgency, suprapubic tenderness, nausea, vomiting, she is afebrile  UA C/S to r/o UTI  Onychomycosis of right great toe The right great toe, offered [place], Podiatry, the patient desires home remedy.   Constipation Stable, continue MiraLax.   Edema of left lower extremity due to peripheral venous insufficiency Trace, TED, continue Metolazone.   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   Parkinsonism ([place]) W/c bond, no worsening in tremors, not disabling, continue Sinemet, f/u Neurology-last seen 07/18/19  Overactive bladder Better after Myrbetriq started until c/o increased urinary frequency today, will UA C/S to r/o UTI in setting of dysuria  Anemia Stable, Hgb 11s, continue Fe  GERD (gastroesophageal reflux disease) Stable, continue Omeprazole.     Family/ staff Communication: plan of care reviewed with the patient and charge nurse.   Labs/tests ordered:  UA C/S  Time spend 25 minutes

## 2019-09-08 NOTE — Assessment & Plan Note (Signed)
burning sensation upon urination, increased urinary frequency, denied urinary urgency, suprapubic tenderness, nausea, vomiting, she is afebrile  UA C/S to r/o UTI

## 2019-09-08 NOTE — Assessment & Plan Note (Signed)
Mostly right hip pain is well controlled, continue Tylenol, Methocarbamol.

## 2019-09-08 NOTE — Assessment & Plan Note (Signed)
The right great toe, offered [place], Podiatry, the patient desires home remedy.

## 2019-09-08 NOTE — Assessment & Plan Note (Signed)
Better after Myrbetriq started until c/o increased urinary frequency today, will UA C/S to r/o UTI in setting of dysuria

## 2019-09-08 NOTE — Assessment & Plan Note (Signed)
Heart rate is in control, continue Diltiazem, Xarelto, last seen by Cardiology 08/09/19

## 2019-09-22 DIAGNOSIS — R278 Other lack of coordination: Secondary | ICD-10-CM | POA: Diagnosis not present

## 2019-09-22 DIAGNOSIS — R29898 Other symptoms and signs involving the musculoskeletal system: Secondary | ICD-10-CM | POA: Diagnosis not present

## 2019-09-22 DIAGNOSIS — G20 Parkinson's disease: Secondary | ICD-10-CM | POA: Diagnosis not present

## 2019-09-22 DIAGNOSIS — Z20828 Contact with and (suspected) exposure to other viral communicable diseases: Secondary | ICD-10-CM | POA: Diagnosis not present

## 2019-09-22 DIAGNOSIS — R296 Repeated falls: Secondary | ICD-10-CM | POA: Diagnosis not present

## 2019-09-22 DIAGNOSIS — M6281 Muscle weakness (generalized): Secondary | ICD-10-CM | POA: Diagnosis not present

## 2019-09-22 DIAGNOSIS — E785 Hyperlipidemia, unspecified: Secondary | ICD-10-CM | POA: Diagnosis not present

## 2019-09-22 DIAGNOSIS — I480 Paroxysmal atrial fibrillation: Secondary | ICD-10-CM | POA: Diagnosis not present

## 2019-09-22 DIAGNOSIS — M329 Systemic lupus erythematosus, unspecified: Secondary | ICD-10-CM | POA: Diagnosis not present

## 2019-09-28 ENCOUNTER — Encounter: Payer: Self-pay | Admitting: Nurse Practitioner

## 2019-09-28 ENCOUNTER — Non-Acute Institutional Stay (SKILLED_NURSING_FACILITY): Payer: Medicare PPO | Admitting: Nurse Practitioner

## 2019-09-28 DIAGNOSIS — M329 Systemic lupus erythematosus, unspecified: Secondary | ICD-10-CM | POA: Diagnosis not present

## 2019-09-28 DIAGNOSIS — N3941 Urge incontinence: Secondary | ICD-10-CM

## 2019-09-28 DIAGNOSIS — I480 Paroxysmal atrial fibrillation: Secondary | ICD-10-CM | POA: Diagnosis not present

## 2019-09-28 DIAGNOSIS — Z20828 Contact with and (suspected) exposure to other viral communicable diseases: Secondary | ICD-10-CM | POA: Diagnosis not present

## 2019-09-28 DIAGNOSIS — G20 Parkinson's disease: Secondary | ICD-10-CM | POA: Diagnosis not present

## 2019-09-28 DIAGNOSIS — K5900 Constipation, unspecified: Secondary | ICD-10-CM

## 2019-09-28 DIAGNOSIS — I872 Venous insufficiency (chronic) (peripheral): Secondary | ICD-10-CM | POA: Diagnosis not present

## 2019-09-28 DIAGNOSIS — I4819 Other persistent atrial fibrillation: Secondary | ICD-10-CM | POA: Diagnosis not present

## 2019-09-28 DIAGNOSIS — M159 Polyosteoarthritis, unspecified: Secondary | ICD-10-CM

## 2019-09-28 DIAGNOSIS — R29898 Other symptoms and signs involving the musculoskeletal system: Secondary | ICD-10-CM | POA: Diagnosis not present

## 2019-09-28 DIAGNOSIS — R278 Other lack of coordination: Secondary | ICD-10-CM | POA: Diagnosis not present

## 2019-09-28 DIAGNOSIS — K219 Gastro-esophageal reflux disease without esophagitis: Secondary | ICD-10-CM | POA: Diagnosis not present

## 2019-09-28 DIAGNOSIS — M6281 Muscle weakness (generalized): Secondary | ICD-10-CM | POA: Diagnosis not present

## 2019-09-28 DIAGNOSIS — R296 Repeated falls: Secondary | ICD-10-CM | POA: Diagnosis not present

## 2019-09-28 DIAGNOSIS — E785 Hyperlipidemia, unspecified: Secondary | ICD-10-CM | POA: Diagnosis not present

## 2019-09-28 DIAGNOSIS — I959 Hypotension, unspecified: Secondary | ICD-10-CM | POA: Diagnosis not present

## 2019-09-28 DIAGNOSIS — D649 Anemia, unspecified: Secondary | ICD-10-CM

## 2019-09-28 NOTE — Assessment & Plan Note (Signed)
Urinary frequency, better, continue  Mirabegron.

## 2019-09-28 NOTE — Assessment & Plan Note (Signed)
Anemia, stable, Hgb 11.7 09/06/19,takes Fe

## 2019-09-28 NOTE — Assessment & Plan Note (Signed)
Parkinson's stable, f/u neurology, last 07/18/19,on Sinemet.  

## 2019-09-28 NOTE — Assessment & Plan Note (Signed)
R hip pain, on Tylenol 1000mg  tidprn, Methocarbamol 250mg  qd.

## 2019-09-28 NOTE — Assessment & Plan Note (Signed)
Stable, continue MiraLax.  °

## 2019-09-28 NOTE — Assessment & Plan Note (Signed)
Chronic, continue Metolazone 3x/wk

## 2019-09-28 NOTE — Assessment & Plan Note (Signed)
Afib, heart rate is in control, on Xarelto 20mg qd, Diltiazem 120mg qd.08/09/19 Cardiology f/u.  

## 2019-09-28 NOTE — Assessment & Plan Note (Signed)
stable, on Omeprazole 20mg qd. 

## 2019-09-28 NOTE — Progress Notes (Signed)
Location:   [facility] Room Number: 13 Place of Service:  SNF [phone]) Provider:  Marlana Latus, NP  [name], MD  Patient Care Team: [name], MD as PCP - General (Internal Medicine) [name], [name], MD as PCP - Cardiology (Cardiology) [name], [name], DO as Consulting Physician (Neurology) Mast, Man X, NP as Nurse Practitioner (Internal Medicine)  Extended Emergency Contact Information Primary Emergency Contact: Verbena, Boeding Mobile Phone: [phone] Relation: Daughter Interpreter needed? No Secondary Emergency Contact: [facility] Address: [address]          [place], [place] [address] [name] of [place] Phone: [phone] Mobile Phone: [phone] Relation: Son  Code Status:  DNR Goals of care: Advanced Directive information Advanced Directives 08/12/2019  Does Patient Have a Medical Advance Directive? Yes  Type of Paramedic of [place];Out of facility DNR (pink MOST or yellow form)  Does patient want to make changes to medical advance directive? No - Patient declined  Copy of [place] in Chart? Yes - validated most recent copy scanned in chart (See row information)  Would patient like information on creating a medical advance directive? -  Pre-existing out of facility DNR order (yellow form or pink MOST form) -     Chief Complaint  Patient presents with  . Medical Management of Chronic Issues    Routine follow up visit  . Best Practice Recommendations    Hep c screening, flu vaccine  . Quality Metric Gaps    Colonoscopy    HPI:  Pt is a 79 y.o. female seen today for medical management of chronic diseases.    Afib, heart rate is in control, on Xarelto 44m qd, Diltiazem 1254mqd.08/09/19 Cardiology f/u. R hip pain, on Tylenol 100048midprn, Methocarbamol 250m10m.  Parkinson's stable, f/u neurology, last 07/18/19,on  Sinemet. Anemia, stable, Hgb 11.7 09/06/19,takes Fe GERD, stable, on Omeprazole 20mg37m Urinary frequency, takes Mirabegron.  Constipation, takes MiraLax PVD/edema BLE, on Metolazone 2.5mg 373mk.   Past Medical History:  Diagnosis Date  . Acute lower UTI 09/14/2018  . Anemia 10/15/2018   2016 colonoscopy 10/14/18 wbc 5.4, Hgb 10.1, plt 184 12/01/18 wbc 4.5, Hgb 11.1, plt 188, neutrophils 63,  Na 139, K 3.7, Bun 23, creat 0.69, eGFR 83 on Fe, Hgb 11.7 12/09/18   . Atrial fibrillation (HCC) 5Wood2012   10/07/18 Na 143, K 4.0, Bun 20, creat 0.79, eGFR 72, wbc 7.1, Hgb 10.9, plt 172 10/28/18 Na 142, K 4.0, Bun 19, creat 0.77, eGFR 74 11/18/18 Na 144, K 4.1, Bun 21, creat 0.69, eGFR 83  . Bilateral lower extremity edema 07/19/2018   11/02/18 BMP 2 weeks.   . Cervical pain (neck) 06/20/2010  . Closed fracture of part of upper end of humerus 05/01/2015  . Colles' fracture of right radius 03/05/2015  . Constipation 07/19/2018  . DEGENERATIVE JOINT DISEASE, RIGHT HIP 02/16/2007  . Dupuytren's contracture   . Dysuria 09/20/2018   09/20/18 c/o got up several times last night to go urinate, burning on urination, lower abd /back discomfort, but urinary frequency, leakage are not new. UA C/S, Pyridium 100mg t40m 2 days.  09/21/18 wbc 6.1, Hgb 11.8, plt 210, neutrophil 69.4, Na 142, K 4.1, Bun 19, creat 0.78, TP 6.4, albumin 3.9  . Hematuria 02/04/2014  . Hypotension 09/18/2018  . Long term (current) use of anticoagulants 05/29/2016  . Osteoarthritis of hip    right  . Osteoarthritis of left knee   .  Overactive bladder 10/06/2018  . Parkinson disease ([place])   . Paroxysmal A-fib ([place])   . POSTMENOPAUSAL SYNDROME 02/16/2007  . PREMATURE ATRIAL CONTRACTIONS 02/16/2007  . 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 S/P breast biopsy, left    two o'clock position - benign  . 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 Tremor, unspecified 10/19/2015  . Unstable  gait 02/16/2017  . Vaginal atrophy 10/19/2015  . Venous insufficiency   . Weakness 09/12/2018   Past Surgical History:  Procedure Laterality Date  . BREAST BIOPSY Left   . CATARACT EXTRACTION, BILATERAL      Allergies  Allergen Reactions  . Doxycycline Nausea And Vomiting  . Sulfonamide Derivatives     REACTION: HIVES    Allergies as of 09/28/2019      Reactions   Doxycycline Nausea And Vomiting   Sulfonamide Derivatives    REACTION: HIVES      Medication List       Accurate as of September 28, 2019 11:59 PM. If you have any questions, ask your nurse or doctor.        acetaminophen 500 MG tablet Commonly known as: TYLENOL Take 1,000 mg by mouth 3 (three) times daily as needed.   calcium carbonate 750 MG chewable tablet Commonly known as: TUMS EX Chew 1 tablet by mouth daily. Between the hours of 7am-10am.   carbidopa-levodopa 25-100 MG tablet Commonly known as: SINEMET IR Take 2 tablets by mouth 4 (four) times daily. Morning: 6:30 am, MidDay 10:30 am, Afternoon 2:30 pm, Evening 6:30 pm .   carbidopa-levodopa 50-200 MG tablet Commonly known as: SINEMET CR Take 1 tablet by mouth at bedtime. 9pm   diltiazem 120 MG 24 hr capsule Commonly known as: CARDIZEM CD Take 120 mg by mouth daily. Between the hours of 7am-10am. HOLD IF SBP < 100   ferrous sulfate 325 (65 FE) MG tablet Take 325 mg by mouth every Monday, Wednesday, and Friday. Give with food   hydrocortisone 2.5 % ointment Apply 1 application topically at bedtime. At bedtime for 1 week then change to PRN.   methocarbamol 500 MG tablet Commonly known as: ROBAXIN Take 250 mg by mouth daily. As needed   metolazone 2.5 MG tablet Commonly known as: ZAROXOLYN Take 2.5 mg by mouth every Monday, Wednesday, and Friday. Hold if systolic blood pressure less than 100   Myrbetriq 50 MG Tb24 tablet Generic drug: mirabegron ER Take 50 mg by mouth daily. Between the hours of 7am-10am.   omeprazole 20 MG  capsule Commonly known as: PRILOSEC Take 20 mg by mouth daily.   polyethylene glycol 17 g packet Commonly known as: MIRALAX / GLYCOLAX Take 17 g by mouth daily.   potassium chloride SA 20 MEQ tablet Commonly known as: KLOR-CON Take 20 mEq by mouth daily.   PreviDent 5000 Booster Plus 1.1 % Pste Generic drug: Sodium Fluoride Place 1 application onto teeth in the morning and at bedtime.   rivaroxaban 20 MG Tabs tablet Commonly known as: XARELTO Take 20 mg by mouth every evening. Between the hours of 5pm-6pm.   Vitamin D3 10 MCG (400 UNIT) Caps Take 1 capsule by mouth daily. Between the hours of 7am-10am.   zinc oxide 20 % ointment Apply 1 application topically as needed for irritation. To buttocks after every incontinent episode and as needed for redness. May keep at bedside.       Review of Systems  Constitutional: Negative for fatigue, fever and unexpected weight change.  HENT: Positive for  hearing loss. Negative for congestion and voice change.   Eyes: Negative for visual disturbance.  Respiratory: Negative for cough.   Cardiovascular: Positive for leg swelling.  Gastrointestinal: Negative for abdominal pain and constipation.  Genitourinary: Positive for frequency. Negative for dysuria and urgency.  Musculoskeletal: Positive for arthralgias and gait problem. Negative for myalgias.       Right hip pain is well controlled.   Skin: Negative for color change.       Right lower leg an indurated area is resolved to a marble sized no redness, warmth area.  Thick yellow right great toe nail fell off today.   Neurological: Positive for tremors. Negative for speech difficulty and light-headedness.       Moves slow, fine tremor in fingers, burning sensation  in the R+ L great toes when touched by sheets, comes/goes  Psychiatric/Behavioral: Negative for behavioral problems and sleep disturbance. The patient is not nervous/anxious.     Immunization History  Administered Date(s)  Administered  . Influenza Split 10/20/2012  . Influenza Whole 10/21/2006  . Influenza, High Dose Seasonal PF 10/17/2016  . Influenza-Unspecified 10/20/2013, 10/22/2017  . Moderna SARS-COVID-2 Vaccination 01/22/2019, 02/19/2019  . Pneumococcal Conjugate-13 01/31/2013  . Pneumococcal Polysaccharide-23 01/20/2005, 11/24/2011  . Td 01/21/2004  . Tdap 03/16/2014  . Zoster 04/17/2009   Pertinent  Health Maintenance Due  Topic Date Due  . COLONOSCOPY  06/12/2019  . INFLUENZA VACCINE  08/21/2019  . DEXA SCAN  Completed  . PNA vac Low Risk Adult  Completed   Fall Risk  05/03/2018 11/03/2017 08/04/2017 03/06/2017 11/26/2016  Falls in the past year? 1 No No Yes Yes  Number falls in past yr: 0 - - 2 or more 2 or more  Injury with Fall? 0 - - No No  Risk Factor Category  - - - High Fall Risk High Fall Risk  Risk for fall due to : Other (Comment) - - - -  Follow up Falls evaluation completed;Education provided;Falls prevention discussed - - Falls evaluation completed Falls evaluation completed   Functional Status Survey:    Vitals:   09/28/19 0950  BP: (!) 82/62  Pulse: 82  Resp: 18  Temp: 98.6 F (37 C)  SpO2: 97%  Weight: 140 lb 4.8 oz (63.6 kg)  Height: 5' 8" (1.727 m)   Body mass index is 21.33 kg/m. Physical Exam Vitals and nursing note reviewed.  Constitutional:      Appearance: Normal appearance. She is normal weight.  HENT:     Head: Normocephalic and atraumatic.     Mouth/Throat:     Mouth: Mucous membranes are moist.  Eyes:     Extraocular Movements: Extraocular movements intact.     Conjunctiva/sclera: Conjunctivae normal.     Pupils: Pupils are equal, round, and reactive to light.  Cardiovascular:     Rate and Rhythm: Normal rate. Rhythm irregular.     Heart sounds: No murmur heard.      Comments: DP pulses present L>R Pulmonary:     Breath sounds: No rales.  Abdominal:     General: Bowel sounds are normal.     Palpations: Abdomen is soft.     Tenderness:  There is no abdominal tenderness.  Musculoskeletal:     Cervical back: Normal range of motion and neck supple.     Right lower leg: Edema present.     Left lower leg: Edema present.     Comments: trace edema LLE  Skin:    General: Skin is warm  and dry.     Findings: Bruising present. No erythema.     Comments: lateral right lower leg an indurated area resolved to a marble sized non tender, no warmth or redness area.  Yellow thick right great toe nail fell off today, not traumatic.   Neurological:     General: No focal deficit present.     Mental Status: She is alert and oriented to person, place, and time. Mental status is at baseline.     Coordination: Coordination abnormal.     Gait: Gait abnormal.     Comments: Moves slow, tremor in hands  Psychiatric:        Mood and Affect: Mood normal.        Behavior: Behavior normal.        Thought Content: Thought content normal.     Labs reviewed: Recent Labs    04/01/19 0000 04/01/19 0000 05/17/19 0000 07/19/19 0000 09/06/19 0000  NA 140   < > 140 138 139  K 4.8   < > 3.6 4.1 4.0  CL 104   < > 103 104 103  CO2 31*   < > 31* 29* 29*  BUN 33*   < > 22* 20 16  CREATININE 1.0   < > 0.7 0.7 0.8  CALCIUM 9.3  --  9.4 8.9  --    < > = values in this interval not displayed.   Recent Labs    12/09/18 0000 12/09/18 0000 03/24/19 0000 04/01/19 0000 09/06/19 0000  AST 16   < > 11* 12* 9*  ALT 10  --  5*  --  3*  ALKPHOS 47   < > 50 48 62  ALBUMIN 3.8   < > 4.0 3.8 3.8   < > = values in this interval not displayed.   Recent Labs    12/09/18 0000 12/09/18 0000 03/24/19 0000 04/01/19 0000 04/14/19 0000 05/17/19 0000 09/06/19 0000  WBC 5.5   < > 8.4   < > 5.9 5.2 4.9  NEUTROABS 3,718  --  6,602  --   --   --  3,450  HGB 11.7*   < > 10.9*   < > 9.7* 11.4* 11.7*  HCT 37   < > 34*   < > 30* 34* 36  PLT 189   < > 190   < > 192 184 181   < > = values in this interval not displayed.   Lab Results  Component Value Date    TSH 0.408 09/11/2018   No results found for: HGBA1C Lab Results  Component Value Date   CHOL 185 03/24/2019   HDL 79 (A) 03/24/2019   LDLCALC 94 03/24/2019   LDLDIRECT 104.2 01/25/2013   TRIG 41 03/24/2019   CHOLHDL 2 08/29/2015    Significant Diagnostic Results in last 30 days:  No results found.  Assessment/Plan Atrial fibrillation (HCC) Afib, heart rate is in control, on Xarelto 60m qd, Diltiazem 1240mqd.08/09/19 Cardiology f/u.   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    Parkinsonism (HCC)[place], f/u neurology, last 07/18/19,on Sinemet.   Anemia Anemia, stable, Hgb 11.7 09/06/19,takes Fe   GERD (gastroesophageal reflux disease) stable, on Omeprazole 20mg46m  Constipation Stable, continue MiraLax.   Edema of left lower extremity due to peripheral venous insufficiency Chronic, continue Metolazone 3x/wk  Urge incontinence of urine Urinary frequency, better, continue  Mirabegron.  Hypotension Chronic issue, the patient denied dizziness, headache, chest pain/pressure, palpitation, nausea, vomiting, or generalized malaise. Continue observing.      Family/ staff Communication: plan of care reviewed with the patient and charge nurse.   Labs/tests ordered:  none  Time spend 35 minutes.

## 2019-09-29 NOTE — Assessment & Plan Note (Signed)
Chronic issue, the patient denied dizziness, headache, chest pain/pressure, palpitation, nausea, vomiting, or generalized malaise. Continue observing.

## 2019-09-30 DIAGNOSIS — R29898 Other symptoms and signs involving the musculoskeletal system: Secondary | ICD-10-CM | POA: Diagnosis not present

## 2019-09-30 DIAGNOSIS — R296 Repeated falls: Secondary | ICD-10-CM | POA: Diagnosis not present

## 2019-09-30 DIAGNOSIS — I480 Paroxysmal atrial fibrillation: Secondary | ICD-10-CM | POA: Diagnosis not present

## 2019-09-30 DIAGNOSIS — G20 Parkinson's disease: Secondary | ICD-10-CM | POA: Diagnosis not present

## 2019-09-30 DIAGNOSIS — Z20828 Contact with and (suspected) exposure to other viral communicable diseases: Secondary | ICD-10-CM | POA: Diagnosis not present

## 2019-09-30 DIAGNOSIS — M6281 Muscle weakness (generalized): Secondary | ICD-10-CM | POA: Diagnosis not present

## 2019-09-30 DIAGNOSIS — E785 Hyperlipidemia, unspecified: Secondary | ICD-10-CM | POA: Diagnosis not present

## 2019-09-30 DIAGNOSIS — R278 Other lack of coordination: Secondary | ICD-10-CM | POA: Diagnosis not present

## 2019-09-30 DIAGNOSIS — M329 Systemic lupus erythematosus, unspecified: Secondary | ICD-10-CM | POA: Diagnosis not present

## 2019-10-04 DIAGNOSIS — R296 Repeated falls: Secondary | ICD-10-CM | POA: Diagnosis not present

## 2019-10-04 DIAGNOSIS — R29898 Other symptoms and signs involving the musculoskeletal system: Secondary | ICD-10-CM | POA: Diagnosis not present

## 2019-10-04 DIAGNOSIS — D692 Other nonthrombocytopenic purpura: Secondary | ICD-10-CM | POA: Diagnosis not present

## 2019-10-04 DIAGNOSIS — L821 Other seborrheic keratosis: Secondary | ICD-10-CM | POA: Diagnosis not present

## 2019-10-04 DIAGNOSIS — D1801 Hemangioma of skin and subcutaneous tissue: Secondary | ICD-10-CM | POA: Diagnosis not present

## 2019-10-04 DIAGNOSIS — I480 Paroxysmal atrial fibrillation: Secondary | ICD-10-CM | POA: Diagnosis not present

## 2019-10-04 DIAGNOSIS — M329 Systemic lupus erythematosus, unspecified: Secondary | ICD-10-CM | POA: Diagnosis not present

## 2019-10-04 DIAGNOSIS — M6281 Muscle weakness (generalized): Secondary | ICD-10-CM | POA: Diagnosis not present

## 2019-10-04 DIAGNOSIS — G20 Parkinson's disease: Secondary | ICD-10-CM | POA: Diagnosis not present

## 2019-10-04 DIAGNOSIS — L820 Inflamed seborrheic keratosis: Secondary | ICD-10-CM | POA: Diagnosis not present

## 2019-10-04 DIAGNOSIS — R278 Other lack of coordination: Secondary | ICD-10-CM | POA: Diagnosis not present

## 2019-10-04 DIAGNOSIS — Z20828 Contact with and (suspected) exposure to other viral communicable diseases: Secondary | ICD-10-CM | POA: Diagnosis not present

## 2019-10-04 DIAGNOSIS — L814 Other melanin hyperpigmentation: Secondary | ICD-10-CM | POA: Diagnosis not present

## 2019-10-04 DIAGNOSIS — E785 Hyperlipidemia, unspecified: Secondary | ICD-10-CM | POA: Diagnosis not present

## 2019-10-04 DIAGNOSIS — L570 Actinic keratosis: Secondary | ICD-10-CM | POA: Diagnosis not present

## 2019-10-07 ENCOUNTER — Encounter: Payer: Self-pay | Admitting: Internal Medicine

## 2019-10-07 ENCOUNTER — Non-Acute Institutional Stay (SKILLED_NURSING_FACILITY): Payer: Medicare PPO | Admitting: Internal Medicine

## 2019-10-07 DIAGNOSIS — M6281 Muscle weakness (generalized): Secondary | ICD-10-CM | POA: Diagnosis not present

## 2019-10-07 DIAGNOSIS — Z20828 Contact with and (suspected) exposure to other viral communicable diseases: Secondary | ICD-10-CM | POA: Diagnosis not present

## 2019-10-07 DIAGNOSIS — G20 Parkinson's disease: Secondary | ICD-10-CM | POA: Diagnosis not present

## 2019-10-07 DIAGNOSIS — R29898 Other symptoms and signs involving the musculoskeletal system: Secondary | ICD-10-CM | POA: Diagnosis not present

## 2019-10-07 DIAGNOSIS — I959 Hypotension, unspecified: Secondary | ICD-10-CM | POA: Diagnosis not present

## 2019-10-07 DIAGNOSIS — M62838 Other muscle spasm: Secondary | ICD-10-CM | POA: Diagnosis not present

## 2019-10-07 DIAGNOSIS — I4819 Other persistent atrial fibrillation: Secondary | ICD-10-CM | POA: Diagnosis not present

## 2019-10-07 DIAGNOSIS — G20C Parkinsonism, unspecified: Secondary | ICD-10-CM

## 2019-10-07 DIAGNOSIS — R600 Localized edema: Secondary | ICD-10-CM

## 2019-10-07 DIAGNOSIS — E785 Hyperlipidemia, unspecified: Secondary | ICD-10-CM | POA: Diagnosis not present

## 2019-10-07 DIAGNOSIS — R296 Repeated falls: Secondary | ICD-10-CM | POA: Diagnosis not present

## 2019-10-07 DIAGNOSIS — M329 Systemic lupus erythematosus, unspecified: Secondary | ICD-10-CM | POA: Diagnosis not present

## 2019-10-07 DIAGNOSIS — I480 Paroxysmal atrial fibrillation: Secondary | ICD-10-CM | POA: Diagnosis not present

## 2019-10-07 DIAGNOSIS — R278 Other lack of coordination: Secondary | ICD-10-CM | POA: Diagnosis not present

## 2019-10-07 NOTE — Progress Notes (Signed)
Location:   [facility] Room Number: [place] of Service:  SNF [phone]) Provider:  Veleta Miners MD  [name], MD  Patient Care Team: [name], MD as PCP - General (Internal Medicine) [name], [name], MD as PCP - Cardiology (Cardiology) [name], [name], DO as Consulting Physician (Neurology) Mast, Man X, NP as Nurse Practitioner (Internal Medicine)  Extended Emergency Contact Information Primary Emergency Contact: [place], [place] Mobile Phone: [phone] Relation: Daughter Interpreter needed? No Secondary Emergency Contact: [facility] Address: [address]          [place], [place] [address] [name] of [place] Phone: [phone] Mobile Phone: [phone] Relation: Son  Code Status:  DNR Goals of care: Advanced Directive information Advanced Directives 08/12/2019  Does Patient Have a Medical Advance Directive? Yes  Type of Paramedic of [place];Out of facility DNR (pink MOST or yellow form)  Does patient want to make changes to medical advance directive? No - Patient declined  Copy of [place] in Chart? Yes - validated most recent copy scanned in chart (See row information)  Would patient like information on creating a medical advance directive? -  Pre-existing out of facility DNR order (yellow form or pink MOST form) -     Chief Complaint  Patient presents with  . Acute Visit    Low blood pressure    HPI:  Pt is a 79 y.o. female seen today for an acute visit for Low BP  Patient has a history of Parkinson disease diagnosed 2 years ago on Sinemet and follows withNeurology, paroxysmal A. fib on Xarelto,  hyperlipidemia, H/o Ovarian Cyst, Lower extremity edema,2 D echo with N EF Biatrial enlargementand Urinary Incontinence Has Chronic Right Hip Pain. 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  Was seen today as he BP has  been running low. Patient has a history of low blood pressures.  She is completely asymptomatic.  Denies any dizziness fever chills nausea vomiting any dysuria cough. Her only other complaint today was some muscle spasm in her neck.  She said she has had this before.  She thinks it is related to her Parkinson.  States taking Tylenol is helping.  Her daughter is visiting her from [place].  Past Medical History:  Diagnosis Date  . Acute lower UTI 09/14/2018  . Anemia 10/15/2018   2016 colonoscopy 10/14/18 wbc 5.4, Hgb 10.1, plt 184 12/01/18 wbc 4.5, Hgb 11.1, plt 188, neutrophils 63,  Na 139, K 3.7, Bun 23, creat 0.69, eGFR 83 on Fe, Hgb 11.7 12/09/18   . Atrial fibrillation ([place]) 05/21/2010   10/07/18 Na 143, K 4.0, Bun 20, creat 0.79, eGFR 72, wbc 7.1, Hgb 10.9, plt 172 10/28/18 Na 142, K 4.0, Bun 19, creat 0.77, eGFR 74 11/18/18 Na 144, K 4.1, Bun 21, creat 0.69, eGFR 83  . Bilateral lower extremity edema 07/19/2018   11/02/18 BMP 2 weeks.   . Cervical pain (neck) 06/20/2010  . Closed fracture of part of upper end of humerus 05/01/2015  . Colles' fracture of right radius 03/05/2015  . Constipation 07/19/2018  . DEGENERATIVE JOINT DISEASE, RIGHT HIP 02/16/2007  . Dupuytren's contracture   . Dysuria 09/20/2018   09/20/18 c/o got up several times last night to go urinate, burning on urination, lower abd /back discomfort, but urinary frequency, leakage are not new. UA C/S, Pyridium 159m tid x 2 days.  09/21/18 wbc 6.1, Hgb  11.8, plt 210, neutrophil 69.4, Na 142, K 4.1, Bun 19, creat 0.78, TP 6.4, albumin 3.9  . Hematuria 02/04/2014  . Hypotension 09/18/2018  . Long term (current) use of anticoagulants 05/29/2016  . Osteoarthritis of hip    right  . Osteoarthritis of left knee   . Overactive bladder 10/06/2018  . Parkinson disease ([place])   . Paroxysmal A-fib ([place])   . POSTMENOPAUSAL SYNDROME 02/16/2007  . PREMATURE ATRIAL CONTRACTIONS 02/16/2007  . 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 S/P breast  biopsy, left    two o'clock position - benign  . 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 Tremor, unspecified 10/19/2015  . Unstable gait 02/16/2017  . Vaginal atrophy 10/19/2015  . Venous insufficiency   . Weakness 09/12/2018   Past Surgical History:  Procedure Laterality Date  . BREAST BIOPSY Left   . CATARACT EXTRACTION, BILATERAL      Allergies  Allergen Reactions  . Doxycycline Nausea And Vomiting  . Sulfonamide Derivatives     REACTION: HIVES    Allergies as of 10/07/2019      Reactions   Doxycycline Nausea And Vomiting   Sulfonamide Derivatives    REACTION: HIVES      Medication List       Accurate as of October 07, 2019 10:37 AM. If you have any questions, ask your nurse or doctor.        acetaminophen 500 MG tablet Commonly known as: TYLENOL Take 1,000 mg by mouth 3 (three) times daily as needed.   calcium carbonate 750 MG chewable tablet Commonly known as: TUMS EX Chew 1 tablet by mouth daily. Between the hours of 7am-10am.   carbidopa-levodopa 25-100 MG tablet Commonly known as: SINEMET IR Take 2 tablets by mouth 4 (four) times daily. Morning: 6:30 am, MidDay 10:30 am, Afternoon 2:30 pm, Evening 6:30 pm .   carbidopa-levodopa 50-200 MG tablet Commonly known as: SINEMET CR Take 1 tablet by mouth at bedtime. 9pm   diltiazem 120 MG 24 hr capsule Commonly known as: CARDIZEM CD Take 120 mg by mouth daily. Between the hours of 7am-10am. HOLD IF SBP < 100   ferrous sulfate 325 (65 FE) MG tablet Take 325 mg by mouth every Monday, Wednesday, and Friday. Give with food   hydrocortisone 2.5 % ointment Apply 1 application topically at bedtime. At bedtime for 1 week then change to PRN.   methocarbamol 500 MG tablet Commonly known as: ROBAXIN Take 250 mg by mouth daily. As needed   metolazone 2.5 MG tablet Commonly known as: ZAROXOLYN Take 2.5 mg by mouth every Monday, Wednesday, and Friday. Hold if systolic blood pressure less than 100   Myrbetriq 50  MG Tb24 tablet Generic drug: mirabegron ER Take 50 mg by mouth daily. Between the hours of 7am-10am.   omeprazole 20 MG capsule Commonly known as: PRILOSEC Take 20 mg by mouth daily.   polyethylene glycol 17 g packet Commonly known as: MIRALAX / GLYCOLAX Take 17 g by mouth daily.   potassium chloride SA 20 MEQ tablet Commonly known as: KLOR-CON Take 20 mEq by mouth daily.   PreviDent 5000 Booster Plus 1.1 % Pste Generic drug: Sodium Fluoride Place 1 application onto teeth in the morning and at bedtime.   rivaroxaban 20 MG Tabs tablet Commonly known as: XARELTO Take 20 mg by mouth every evening. Between the hours of 5pm-6pm.   Vitamin D3 10 MCG (400 UNIT) Caps Take 1 capsule by mouth daily. Between the hours of 7am-10am.  zinc oxide 20 % ointment Apply 1 application topically as needed for irritation. To buttocks after every incontinent episode and as needed for redness. May keep at bedside.       Review of Systems  Constitutional: Negative.   HENT: Negative.   Respiratory: Negative.   Cardiovascular: Positive for leg swelling.  Gastrointestinal: Negative.   Genitourinary: Negative.   Musculoskeletal: Positive for gait problem, myalgias, neck pain and neck stiffness.  Skin: Negative.   Neurological: Negative for dizziness and weakness.  Psychiatric/Behavioral: Negative.     Immunization History  Administered Date(s) Administered  . Influenza Split 10/20/2012  . Influenza Whole 10/21/2006  . Influenza, High Dose Seasonal PF 10/17/2016  . Influenza-Unspecified 10/20/2013, 10/22/2017  . Moderna SARS-COVID-2 Vaccination 01/22/2019, 02/19/2019  . Pneumococcal Conjugate-13 01/31/2013  . Pneumococcal Polysaccharide-23 01/20/2005, 11/24/2011  . Td 01/21/2004  . Tdap 03/16/2014  . Zoster 04/17/2009   Pertinent  Health Maintenance Due  Topic Date Due  . COLONOSCOPY  06/12/2019  . INFLUENZA VACCINE  08/21/2019  . DEXA SCAN  Completed  . PNA vac Low Risk Adult   Completed   Fall Risk  05/03/2018 11/03/2017 08/04/2017 03/06/2017 11/26/2016  Falls in the past year? 1 No No Yes Yes  Number falls in past yr: 0 - - 2 or more 2 or more  Injury with Fall? 0 - - No No  Risk Factor Category  - - - High Fall Risk High Fall Risk  Risk for fall due to : Other (Comment) - - - -  Follow up Falls evaluation completed;Education provided;Falls prevention discussed - - Falls evaluation completed Falls evaluation completed   Functional Status Survey:    Vitals:   10/07/19 1013  BP: (!) 70/50  Pulse: 64  Resp: 18  Temp: (!) 97.4 F (36.3 C)  SpO2: 96%  Weight: 140 lb 8 oz (63.7 kg)  Height: '5\' 8"'  (1.727 m)   Body mass index is 21.36 kg/m. Physical Exam Vitals reviewed.  Constitutional:      Appearance: Normal appearance.  HENT:     Head: Normocephalic.     Nose: Nose normal.     Mouth/Throat:     Mouth: Mucous membranes are moist.     Pharynx: Oropharynx is clear.  Eyes:     Pupils: Pupils are equal, round, and reactive to light.  Neck:     Comments: Very Stiff neck. Limited motion  Cardiovascular:     Rate and Rhythm: Normal rate and regular rhythm.     Pulses: Normal pulses.  Pulmonary:     Effort: Pulmonary effort is normal.  Abdominal:     General: Abdomen is flat. Bowel sounds are normal.     Palpations: Abdomen is soft.  Musculoskeletal:     Comments: Trace edema  Skin:    General: Skin is warm.  Neurological:     General: No focal deficit present.     Mental Status: She is alert and oriented to person, place, and time.  Psychiatric:        Mood and Affect: Mood normal.        Thought Content: Thought content normal.     Labs reviewed: Recent Labs    04/01/19 0000 04/01/19 0000 05/17/19 0000 07/19/19 0000 09/06/19 0000  NA 140   < > 140 138 139  K 4.8   < > 3.6 4.1 4.0  CL 104   < > 103 104 103  CO2 31*   < > 31* 29* 29*  BUN  33*   < > 22* 20 16  CREATININE 1.0   < > 0.7 0.7 0.8  CALCIUM 9.3  --  9.4 8.9  --    < > =  values in this interval not displayed.   Recent Labs    12/09/18 0000 12/09/18 0000 03/24/19 0000 04/01/19 0000 09/06/19 0000  AST 16   < > 11* 12* 9*  ALT 10  --  5*  --  3*  ALKPHOS 47   < > 50 48 62  ALBUMIN 3.8   < > 4.0 3.8 3.8   < > = values in this interval not displayed.   Recent Labs    12/09/18 0000 12/09/18 0000 03/24/19 0000 04/01/19 0000 04/14/19 0000 05/17/19 0000 09/06/19 0000  WBC 5.5   < > 8.4   < > 5.9 5.2 4.9  NEUTROABS 3,718  --  6,602  --   --   --  3,450  HGB 11.7*   < > 10.9*   < > 9.7* 11.4* 11.7*  HCT 37   < > 34*   < > 30* 34* 36  PLT 189   < > 190   < > 192 184 181   < > = values in this interval not displayed.   Lab Results  Component Value Date   TSH 0.408 09/11/2018   No results found for: HGBA1C Lab Results  Component Value Date   CHOL 185 03/24/2019   HDL 79 (A) 03/24/2019   LDLCALC 94 03/24/2019   LDLDIRECT 104.2 01/25/2013   TRIG 41 03/24/2019   CHOLHDL 2 08/29/2015    Significant Diagnostic Results in last 30 days:  No results found.  Assessment/Plan Hypotension, unspecified hypotension type Can be due to her Parkinson with Cardizem and Metolazone BP did come up to 120/70 later . Will start by discontinuing metolazone. If her blood pressure does not improve then consider midodrine Muscle spasms of neck Discussed with the patient and she thinks is related to her Parkinson Patient very stiff in her neck She is can continue using as needed Tylenol for now Persistent atrial fibrillation (HCC) We will restart the Cardizem with Xarelto Bilateral lower extremity edema Edema is better Continue Ted hoses and Elevation Will use diuretic prn  Parkinsonism, unspecified Parkinsonism type ([place]) On Sinemet D/w the daughter and her about Long acting doses. She has refused to Neurology for any change right now Will continue the same dose for now  Other issues Overactive bladder Myrebetriq Anemia, unspecified type Hgb 11.7   and WBC 4.9 Possible GI Consult for Follow up Colonoscopy H/O Right Hip Pain Pain Seems to be resolved right now    Family/ staff Communication:   Labs/tests ordered:  CBC,BMP

## 2019-10-11 DIAGNOSIS — D649 Anemia, unspecified: Secondary | ICD-10-CM | POA: Diagnosis not present

## 2019-10-11 DIAGNOSIS — Z20828 Contact with and (suspected) exposure to other viral communicable diseases: Secondary | ICD-10-CM | POA: Diagnosis not present

## 2019-10-11 DIAGNOSIS — R296 Repeated falls: Secondary | ICD-10-CM | POA: Diagnosis not present

## 2019-10-11 DIAGNOSIS — G20 Parkinson's disease: Secondary | ICD-10-CM | POA: Diagnosis not present

## 2019-10-11 DIAGNOSIS — M6281 Muscle weakness (generalized): Secondary | ICD-10-CM | POA: Diagnosis not present

## 2019-10-11 DIAGNOSIS — R29898 Other symptoms and signs involving the musculoskeletal system: Secondary | ICD-10-CM | POA: Diagnosis not present

## 2019-10-11 DIAGNOSIS — I480 Paroxysmal atrial fibrillation: Secondary | ICD-10-CM | POA: Diagnosis not present

## 2019-10-11 DIAGNOSIS — E785 Hyperlipidemia, unspecified: Secondary | ICD-10-CM | POA: Diagnosis not present

## 2019-10-11 DIAGNOSIS — M329 Systemic lupus erythematosus, unspecified: Secondary | ICD-10-CM | POA: Diagnosis not present

## 2019-10-11 DIAGNOSIS — R278 Other lack of coordination: Secondary | ICD-10-CM | POA: Diagnosis not present

## 2019-10-11 LAB — BASIC METABOLIC PANEL
BUN: 20 (ref 4–21)
CO2: 27 — AB (ref 13–22)
Chloride: 107 (ref 99–108)
Creatinine: 0.8 (ref 0.5–1.1)
Glucose: 78
Potassium: 4.1 (ref 3.4–5.3)
Sodium: 141 (ref 137–147)

## 2019-10-11 LAB — CBC AND DIFFERENTIAL
HCT: 37 (ref 36–46)
Hemoglobin: 12.1 (ref 12.0–16.0)
Platelets: 157 (ref 150–399)
WBC: 4.2

## 2019-10-11 LAB — CBC: RBC: 3.87 (ref 3.87–5.11)

## 2019-10-11 LAB — COMPREHENSIVE METABOLIC PANEL: Calcium: 9.4 (ref 8.7–10.7)

## 2019-10-14 DIAGNOSIS — E785 Hyperlipidemia, unspecified: Secondary | ICD-10-CM | POA: Diagnosis not present

## 2019-10-14 DIAGNOSIS — I480 Paroxysmal atrial fibrillation: Secondary | ICD-10-CM | POA: Diagnosis not present

## 2019-10-14 DIAGNOSIS — M329 Systemic lupus erythematosus, unspecified: Secondary | ICD-10-CM | POA: Diagnosis not present

## 2019-10-14 DIAGNOSIS — M6281 Muscle weakness (generalized): Secondary | ICD-10-CM | POA: Diagnosis not present

## 2019-10-14 DIAGNOSIS — G20 Parkinson's disease: Secondary | ICD-10-CM | POA: Diagnosis not present

## 2019-10-14 DIAGNOSIS — R29898 Other symptoms and signs involving the musculoskeletal system: Secondary | ICD-10-CM | POA: Diagnosis not present

## 2019-10-14 DIAGNOSIS — R296 Repeated falls: Secondary | ICD-10-CM | POA: Diagnosis not present

## 2019-10-14 DIAGNOSIS — R278 Other lack of coordination: Secondary | ICD-10-CM | POA: Diagnosis not present

## 2019-10-14 DIAGNOSIS — Z20828 Contact with and (suspected) exposure to other viral communicable diseases: Secondary | ICD-10-CM | POA: Diagnosis not present

## 2019-10-18 DIAGNOSIS — M6281 Muscle weakness (generalized): Secondary | ICD-10-CM | POA: Diagnosis not present

## 2019-10-18 DIAGNOSIS — I480 Paroxysmal atrial fibrillation: Secondary | ICD-10-CM | POA: Diagnosis not present

## 2019-10-18 DIAGNOSIS — R278 Other lack of coordination: Secondary | ICD-10-CM | POA: Diagnosis not present

## 2019-10-18 DIAGNOSIS — M329 Systemic lupus erythematosus, unspecified: Secondary | ICD-10-CM | POA: Diagnosis not present

## 2019-10-18 DIAGNOSIS — G20 Parkinson's disease: Secondary | ICD-10-CM | POA: Diagnosis not present

## 2019-10-18 DIAGNOSIS — E785 Hyperlipidemia, unspecified: Secondary | ICD-10-CM | POA: Diagnosis not present

## 2019-10-18 DIAGNOSIS — R29898 Other symptoms and signs involving the musculoskeletal system: Secondary | ICD-10-CM | POA: Diagnosis not present

## 2019-10-18 DIAGNOSIS — R296 Repeated falls: Secondary | ICD-10-CM | POA: Diagnosis not present

## 2019-10-18 DIAGNOSIS — Z20828 Contact with and (suspected) exposure to other viral communicable diseases: Secondary | ICD-10-CM | POA: Diagnosis not present

## 2019-10-21 DIAGNOSIS — Z20828 Contact with and (suspected) exposure to other viral communicable diseases: Secondary | ICD-10-CM | POA: Diagnosis not present

## 2019-10-21 DIAGNOSIS — R531 Weakness: Secondary | ICD-10-CM | POA: Diagnosis not present

## 2019-10-21 DIAGNOSIS — G20 Parkinson's disease: Secondary | ICD-10-CM | POA: Diagnosis not present

## 2019-10-21 DIAGNOSIS — N390 Urinary tract infection, site not specified: Secondary | ICD-10-CM | POA: Diagnosis not present

## 2019-10-21 DIAGNOSIS — I480 Paroxysmal atrial fibrillation: Secondary | ICD-10-CM | POA: Diagnosis not present

## 2019-10-21 DIAGNOSIS — M6282 Rhabdomyolysis: Secondary | ICD-10-CM | POA: Diagnosis not present

## 2019-10-21 DIAGNOSIS — R29898 Other symptoms and signs involving the musculoskeletal system: Secondary | ICD-10-CM | POA: Diagnosis not present

## 2019-10-21 DIAGNOSIS — R296 Repeated falls: Secondary | ICD-10-CM | POA: Diagnosis not present

## 2019-10-21 DIAGNOSIS — M329 Systemic lupus erythematosus, unspecified: Secondary | ICD-10-CM | POA: Diagnosis not present

## 2019-10-25 DIAGNOSIS — M329 Systemic lupus erythematosus, unspecified: Secondary | ICD-10-CM | POA: Diagnosis not present

## 2019-10-25 DIAGNOSIS — R29898 Other symptoms and signs involving the musculoskeletal system: Secondary | ICD-10-CM | POA: Diagnosis not present

## 2019-10-25 DIAGNOSIS — R296 Repeated falls: Secondary | ICD-10-CM | POA: Diagnosis not present

## 2019-10-25 DIAGNOSIS — G20 Parkinson's disease: Secondary | ICD-10-CM | POA: Diagnosis not present

## 2019-10-25 DIAGNOSIS — I480 Paroxysmal atrial fibrillation: Secondary | ICD-10-CM | POA: Diagnosis not present

## 2019-10-25 DIAGNOSIS — R531 Weakness: Secondary | ICD-10-CM | POA: Diagnosis not present

## 2019-10-25 DIAGNOSIS — M6282 Rhabdomyolysis: Secondary | ICD-10-CM | POA: Diagnosis not present

## 2019-10-25 DIAGNOSIS — Z20828 Contact with and (suspected) exposure to other viral communicable diseases: Secondary | ICD-10-CM | POA: Diagnosis not present

## 2019-10-25 DIAGNOSIS — N390 Urinary tract infection, site not specified: Secondary | ICD-10-CM | POA: Diagnosis not present

## 2019-10-28 DIAGNOSIS — R296 Repeated falls: Secondary | ICD-10-CM | POA: Diagnosis not present

## 2019-10-28 DIAGNOSIS — R531 Weakness: Secondary | ICD-10-CM | POA: Diagnosis not present

## 2019-10-28 DIAGNOSIS — N390 Urinary tract infection, site not specified: Secondary | ICD-10-CM | POA: Diagnosis not present

## 2019-10-28 DIAGNOSIS — G20 Parkinson's disease: Secondary | ICD-10-CM | POA: Diagnosis not present

## 2019-10-28 DIAGNOSIS — Z20828 Contact with and (suspected) exposure to other viral communicable diseases: Secondary | ICD-10-CM | POA: Diagnosis not present

## 2019-10-28 DIAGNOSIS — I480 Paroxysmal atrial fibrillation: Secondary | ICD-10-CM | POA: Diagnosis not present

## 2019-10-28 DIAGNOSIS — M6282 Rhabdomyolysis: Secondary | ICD-10-CM | POA: Diagnosis not present

## 2019-10-28 DIAGNOSIS — M329 Systemic lupus erythematosus, unspecified: Secondary | ICD-10-CM | POA: Diagnosis not present

## 2019-10-28 DIAGNOSIS — R29898 Other symptoms and signs involving the musculoskeletal system: Secondary | ICD-10-CM | POA: Diagnosis not present

## 2019-10-31 ENCOUNTER — Non-Acute Institutional Stay (SKILLED_NURSING_FACILITY): Payer: Medicare PPO | Admitting: Nurse Practitioner

## 2019-10-31 ENCOUNTER — Encounter: Payer: Self-pay | Admitting: Nurse Practitioner

## 2019-10-31 DIAGNOSIS — K219 Gastro-esophageal reflux disease without esophagitis: Secondary | ICD-10-CM

## 2019-10-31 DIAGNOSIS — M159 Polyosteoarthritis, unspecified: Secondary | ICD-10-CM | POA: Diagnosis not present

## 2019-10-31 DIAGNOSIS — I4819 Other persistent atrial fibrillation: Secondary | ICD-10-CM

## 2019-10-31 DIAGNOSIS — K5900 Constipation, unspecified: Secondary | ICD-10-CM

## 2019-10-31 DIAGNOSIS — D649 Anemia, unspecified: Secondary | ICD-10-CM

## 2019-10-31 DIAGNOSIS — I872 Venous insufficiency (chronic) (peripheral): Secondary | ICD-10-CM

## 2019-10-31 DIAGNOSIS — N3941 Urge incontinence: Secondary | ICD-10-CM | POA: Diagnosis not present

## 2019-10-31 DIAGNOSIS — R300 Dysuria: Secondary | ICD-10-CM | POA: Diagnosis not present

## 2019-10-31 NOTE — Assessment & Plan Note (Signed)
Parkinson's stable, f/u neurology, last 07/18/19,on Sinemet.  

## 2019-10-31 NOTE — Assessment & Plan Note (Signed)
Anemia, stable, Hgb 11.7 09/06/19,takes Fe

## 2019-10-31 NOTE — Assessment & Plan Note (Signed)
Chronic issue, improved after taking Mirabegron. Relapsed since onset of dysuria.

## 2019-10-31 NOTE — Assessment & Plan Note (Signed)
Afib, heart rate is in control, on Xarelto 20mg qd, Diltiazem 120mg qd.08/09/19 Cardiology f/u.  

## 2019-10-31 NOTE — Assessment & Plan Note (Signed)
Stable, continue MiraLax.  °

## 2019-10-31 NOTE — Assessment & Plan Note (Signed)
Stable, continue Omeprazole.  

## 2019-10-31 NOTE — Assessment & Plan Note (Signed)
dysuria, urinary urgency, frequency, lower abd/back discomfort, generalized weakness. The patient is afebrile, at her baseline of eating/sleeping pattern. Will obtain UA C/S to r/i UTI.

## 2019-10-31 NOTE — Assessment & Plan Note (Signed)
PVD/edema BLE, on Metolazone 2.5mg  3x/wk.

## 2019-10-31 NOTE — Assessment & Plan Note (Signed)
R hip pain, on Tylenol 1000mg tidprn, Methocarbamol 250mg qd.   

## 2019-10-31 NOTE — Progress Notes (Signed)
Location:   SNF [place] Room Number: 13 Place of Service:  SNF (31) Provider: [facility] Mast NP  [name], MD  Patient Care Team: [name], MD as PCP - General (Internal Medicine) [name], [name], MD as PCP - Cardiology (Cardiology) [name], [name], DO as Consulting Physician (Neurology) Mast, Man X, NP as Nurse Practitioner (Internal Medicine)  Extended Emergency Contact Information Primary Emergency Contact: Gennifer, [place] Mobile Phone: [phone] Relation: Daughter Interpreter needed? No Secondary Emergency Contact: [facility] Address: [address]          [place], [place] [address] [name] of [place] Phone: [phone] Mobile Phone: [phone] Relation: Son  Code Status: DNR Goals of care: Advanced Directive information Advanced Directives 08/12/2019  Does Patient Have a Medical Advance Directive? Yes  Type of Paramedic of [place];Out of facility DNR (pink MOST or yellow form)  Does patient want to make changes to medical advance directive? No - Patient declined  Copy of [place] in Chart? Yes - validated most recent copy scanned in chart (See row information)  Would patient like information on creating a medical advance directive? -  Pre-existing out of facility DNR order (yellow form or pink MOST form) -     Chief Complaint  Patient presents with  . Acute Visit    Dysuria    HPI:  Pt is a 79 y.o. female seen today for an acute visit for dysuria, urinary urgency, frequency, lower abd/back discomfort, generalized weakness. The patient is afebrile, at her baseline of eating/sleeping pattern.    Afib, heart rate is in control, on Xarelto 34m qd, Diltiazem 1279mqd.08/09/19 Cardiology f/u. R hip pain, on Tylenol 100040midprn, Methocarbamol 250m58m.  Parkinson's stable, f/u neurology, last 07/18/19,on Sinemet. Anemia, stable, Hgb 11.7  09/06/19,takes Fe GERD, stable, on Omeprazole 20mg32m Urge incontinent of urine, takes Mirabegron.             Constipation, takes MiraLax PVD/edema BLE, on Metolazone 2.5mg 39mk.    Past Medical History:  Diagnosis Date  . Acute lower UTI 09/14/2018  . Anemia 10/15/2018   2016 colonoscopy 10/14/18 wbc 5.4, Hgb 10.1, plt 184 12/01/18 wbc 4.5, Hgb 11.1, plt 188, neutrophils 63,  Na 139, K 3.7, Bun 23, creat 0.69, eGFR 83 on Fe, Hgb 11.7 12/09/18   . Atrial fibrillation (HCC) [id number]   10/07/18 Na 143, K 4.0, Bun 20, creat 0.79, eGFR 72, wbc 7.1, Hgb 10.9, plt 172 10/28/18 Na 142, K 4.0, Bun 19, creat 0.77, eGFR 74 11/18/18 Na 144, K 4.1, Bun 21, creat 0.69, eGFR 83  . Bilateral lower extremity edema 07/19/2018   11/02/18 BMP 2 weeks.   . Cervical pain (neck) 06/20/2010  . Closed fracture of part of upper end of humerus 05/01/2015  . Colles' fracture of right radius 03/05/2015  . Constipation 07/19/2018  . DEGENERATIVE JOINT DISEASE, RIGHT HIP 02/16/2007  . Dupuytren's contracture   . Dysuria 09/20/2018   09/20/18 c/o got up several times last night to go urinate, burning on urination, lower abd /back discomfort, but urinary frequency, leakage are not new. UA C/S, Pyridium 100mg t2m 2 days.  09/21/18 wbc 6.1, Hgb 11.8, plt 210, neutrophil 69.4, Na 142, K 4.1, Bun 19, creat 0.78, TP 6.4, albumin 3.9  . Hematuria 02/04/2014  . Hypotension 09/18/2018  . Long term (current) use of anticoagulants 05/29/2016  . Osteoarthritis of hip    right  . Osteoarthritis of left  knee   . Overactive bladder 10/06/2018  . Parkinson disease ([place])   . Paroxysmal A-fib ([place])   . POSTMENOPAUSAL SYNDROME 02/16/2007  . PREMATURE ATRIAL CONTRACTIONS 02/16/2007  . 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 S/P breast biopsy, left    two o'clock position - benign  . 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 Tremor, unspecified 10/19/2015  . Unstable gait 02/16/2017  . 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 Venous insufficiency   . Weakness 09/12/2018   Past Surgical History:  Procedure Laterality Date  . BREAST BIOPSY Left   . CATARACT EXTRACTION, BILATERAL      Allergies  Allergen Reactions  . Doxycycline Nausea And Vomiting  . Sulfonamide Derivatives     REACTION: HIVES    Allergies as of 10/31/2019      Reactions   Doxycycline Nausea And Vomiting   Sulfonamide Derivatives    REACTION: HIVES      Medication List       Accurate as of October 31, 2019 11:59 PM. If you have any questions, ask your nurse or doctor.        acetaminophen 500 MG tablet Commonly known as: TYLENOL Take 1,000 mg by mouth 3 (three) times daily as needed.   calcium carbonate 750 MG chewable tablet Commonly known as: TUMS EX Chew 1 tablet by mouth daily. Between the hours of 7am-10am.   carbidopa-levodopa 25-100 MG tablet Commonly known as: SINEMET IR Take 2 tablets by mouth 4 (four) times daily. Morning: 6:30 am, MidDay 10:30 am, Afternoon 2:30 pm, Evening 6:30 pm .   carbidopa-levodopa 50-200 MG tablet Commonly known as: SINEMET CR Take 1 tablet by mouth at bedtime. 9pm   diltiazem 120 MG 24 hr capsule Commonly known as: CARDIZEM CD Take 120 mg by mouth daily. Between the hours of 7am-10am. HOLD IF SBP < 100   ferrous sulfate 325 (65 FE) MG tablet Take 325 mg by mouth every Monday, Wednesday, and Friday. Give with food   hydrocortisone 2.5 % ointment Apply 1 application topically at bedtime. At bedtime for 1 week then change to PRN.   methocarbamol 500 MG tablet Commonly known as: ROBAXIN Take 250 mg by mouth daily. As needed   Myrbetriq 50 MG Tb24 tablet Generic drug: mirabegron ER Take 50 mg by mouth daily. Between the hours of 7am-10am.   omeprazole 20 MG capsule Commonly known as: PRILOSEC Take 20 mg by mouth daily.   polyethylene glycol 17 g packet Commonly known as: MIRALAX / GLYCOLAX Take 17 g by mouth daily.   PreviDent 5000 Booster Plus 1.1  % Pste Generic drug: Sodium Fluoride Place 1 application onto teeth in the morning and at bedtime.   rivaroxaban 20 MG Tabs tablet Commonly known as: XARELTO Take 20 mg by mouth every evening. Between the hours of 5pm-6pm.   Vitamin D3 10 MCG (400 UNIT) Caps Take 1 capsule by mouth daily. Between the hours of 7am-10am.   zinc oxide 20 % ointment Apply 1 application topically as needed for irritation. To buttocks after every incontinent episode and as needed for redness. May keep at bedside.       Review of Systems  Constitutional: Positive for fatigue. Negative for appetite change and fever.  HENT: Positive for hearing loss. Negative for congestion and voice change.   Eyes: Negative for visual disturbance.  Respiratory: Negative for cough.   Cardiovascular: Positive for leg swelling.  Gastrointestinal: Positive for abdominal pain. Negative for abdominal distention and constipation.  Suprapubic area discomfort.   Genitourinary: Positive for dysuria, frequency and urgency. Negative for hematuria.  Musculoskeletal: Positive for arthralgias, back pain and gait problem. Negative for myalgias.       Right hip pain is well controlled. Lower back discomfort-may be related to sitting in w/c or possible of UTI/dysuria.   Skin: Negative for color change.       thick yellow right great toe nail fell off today.   Neurological: Positive for tremors. Negative for speech difficulty and light-headedness.       Moves slow, fine tremor in fingers, burning sensation  in the R+ L great toes when touched by sheets, comes/goes  Psychiatric/Behavioral: Negative for behavioral problems and sleep disturbance. The patient is not nervous/anxious.     Immunization History  Administered Date(s) Administered  . Influenza Split 10/20/2012  . Influenza Whole 10/21/2006  . Influenza, High Dose Seasonal PF 10/17/2016  . Influenza-Unspecified 10/20/2013, 10/22/2017  . Moderna SARS-COVID-2 Vaccination  01/22/2019, 02/19/2019  . Pneumococcal Conjugate-13 01/31/2013  . Pneumococcal Polysaccharide-23 01/20/2005, 11/24/2011  . Td 01/21/2004  . Tdap 03/16/2014  . Zoster 04/17/2009   Pertinent  Health Maintenance Due  Topic Date Due  . COLONOSCOPY  06/12/2019  . INFLUENZA VACCINE  08/21/2019  . DEXA SCAN  Completed  . PNA vac Low Risk Adult  Completed   Fall Risk  05/03/2018 11/03/2017 08/04/2017 03/06/2017 11/26/2016  Falls in the past year? 1 No No Yes Yes  Number falls in past yr: 0 - - 2 or more 2 or more  Injury with Fall? 0 - - No No  Risk Factor Category  - - - High Fall Risk High Fall Risk  Risk for fall due to : Other (Comment) - - - -  Follow up Falls evaluation completed;Education provided;Falls prevention discussed - - Falls evaluation completed Falls evaluation completed   Functional Status Survey:    Vitals:   10/31/19 1619  BP: 90/62  Pulse: 84  Resp: 18  Temp: (!) 97 F (36.1 C)  SpO2: 98%  Weight: 140 lb 8 oz (63.7 kg)  Height: 5' 8" (1.727 m)   Body mass index is 21.36 kg/m. Physical Exam Vitals and nursing note reviewed.  Constitutional:      Appearance: Normal appearance.  HENT:     Head: Normocephalic and atraumatic.     Mouth/Throat:     Mouth: Mucous membranes are moist.  Eyes:     Extraocular Movements: Extraocular movements intact.     Conjunctiva/sclera: Conjunctivae normal.     Pupils: Pupils are equal, round, and reactive to light.  Cardiovascular:     Rate and Rhythm: Normal rate. Rhythm irregular.     Heart sounds: No murmur heard.      Comments: DP pulses present L>R Pulmonary:     Breath sounds: No rales.  Abdominal:     General: Bowel sounds are normal.     Palpations: Abdomen is soft.     Comments: Suprapubic area discomfort palpated   Musculoskeletal:        General: Tenderness present.     Cervical back: Normal range of motion and neck supple.     Right lower leg: Edema present.     Left lower leg: Edema present.      Comments: trace edema LLE. Lower back discomfort palpated.   Skin:    General: Skin is warm and dry.     Findings: Bruising present. No erythema.     Comments: Yellow thick right great toe  nail fell off today, not traumatic.   Neurological:     General: No focal deficit present.     Mental Status: She is alert and oriented to person, place, and time. Mental status is at baseline.     Coordination: Coordination abnormal.     Gait: Gait abnormal.     Comments: Moves slow, tremor in hands  Psychiatric:        Mood and Affect: Mood normal.        Behavior: Behavior normal.        Thought Content: Thought content normal.     Labs reviewed: Recent Labs    05/17/19 0000 05/17/19 0000 07/19/19 0000 09/06/19 0000 10/11/19 0000  NA 140   < > 138 139 141  K 3.6   < > 4.1 4.0 4.1  CL 103   < > 104 103 107  CO2 31*   < > 29* 29* 27*  BUN 22*   < > _0 CREATININE 0.7   < > 0.7 0.8 0.8  CALCIUM 9.4  --  8.9  --  9.4   < > = values in this interval not displayed.   Recent Labs    12/09/18 0000 12/09/18 0000 03/24/19 0000 04/01/19 0000 09/06/19 0000  AST 16   < > 11* 12* 9*  ALT 10  --  5*  --  3*  ALKPHOS 47   < > 50 48 62  ALBUMIN 3.8   < > 4.0 3.8 3.8   < > = values in this interval not displayed.   Recent Labs    12/09/18 0000 12/09/18 0000 03/24/19 0000 04/01/19 0000 05/17/19 0000 09/06/19 0000 10/11/19 0000  WBC 5.5   < > 8.4   < > 5.2 4.9 4.2  NEUTROABS 3,718  --  6,602  --   --  3,450  --   HGB 11.7*   < > 10.9*   < > 11.4* 11.7* 12.1  HCT 37   < > 34*   < > 34* 36 37  PLT 189   < > 190   < > 184 181 157   < > = values in this interval not displayed.   Lab Results  Component Value Date   TSH 0.408 09/11/2018   No results found for: HGBA1C Lab Results  Component Value Date   CHOL 185 03/24/2019   HDL 79 (A) 03/24/2019   LDLCALC 94 03/24/2019   LDLDIRECT 104.2 01/25/2013   TRIG 41 03/24/2019   CHOLHDL 2 08/29/2015    Significant Diagnostic  Results in last 30 days:  No results found.  Assessment/Plan: Dysuria dysuria, urinary urgency, frequency, lower abd/back discomfort, generalized weakness. The patient is afebrile, at her baseline of eating/sleeping pattern. Will obtain UA C/S to r/i UTI.    Edema of left lower extremity due to peripheral venous insufficiency PVD/edema BLE, on Metolazone 2.51m 3x/wk.    GERD (gastroesophageal reflux disease) Stable, continue Omeprazole.   Parkinsonism ([place] Parkinson's stable, f/u neurology, last 07/18/19,on Sinemet.  Atrial fibrillation (HCC) Afib, heart rate is in control, on Xarelto 272mqd, Diltiazem 1202md.08/09/19 Cardiology f/u.   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   Urge incontinence of urine Chronic issue, improved after taking Mirabegron. Relapsed since onset of dysuria.     Family/ staff Communication:  plan of care reviewed with the patient and charge nurse.   Labs/tests ordered:  UA C/S   Time spend 35 minutes.

## 2019-11-01 ENCOUNTER — Encounter: Payer: Self-pay | Admitting: Nurse Practitioner

## 2019-11-01 DIAGNOSIS — R29898 Other symptoms and signs involving the musculoskeletal system: Secondary | ICD-10-CM | POA: Diagnosis not present

## 2019-11-01 DIAGNOSIS — I480 Paroxysmal atrial fibrillation: Secondary | ICD-10-CM | POA: Diagnosis not present

## 2019-11-01 DIAGNOSIS — R531 Weakness: Secondary | ICD-10-CM | POA: Diagnosis not present

## 2019-11-01 DIAGNOSIS — G20 Parkinson's disease: Secondary | ICD-10-CM | POA: Diagnosis not present

## 2019-11-01 DIAGNOSIS — M329 Systemic lupus erythematosus, unspecified: Secondary | ICD-10-CM | POA: Diagnosis not present

## 2019-11-01 DIAGNOSIS — N390 Urinary tract infection, site not specified: Secondary | ICD-10-CM | POA: Diagnosis not present

## 2019-11-01 DIAGNOSIS — R296 Repeated falls: Secondary | ICD-10-CM | POA: Diagnosis not present

## 2019-11-01 DIAGNOSIS — Z20828 Contact with and (suspected) exposure to other viral communicable diseases: Secondary | ICD-10-CM | POA: Diagnosis not present

## 2019-11-01 DIAGNOSIS — M6282 Rhabdomyolysis: Secondary | ICD-10-CM | POA: Diagnosis not present

## 2019-11-02 ENCOUNTER — Encounter: Payer: Self-pay | Admitting: Nurse Practitioner

## 2019-11-02 ENCOUNTER — Non-Acute Institutional Stay (SKILLED_NURSING_FACILITY): Payer: Medicare PPO | Admitting: Nurse Practitioner

## 2019-11-02 DIAGNOSIS — R600 Localized edema: Secondary | ICD-10-CM | POA: Diagnosis not present

## 2019-11-02 DIAGNOSIS — M159 Polyosteoarthritis, unspecified: Secondary | ICD-10-CM

## 2019-11-02 DIAGNOSIS — R300 Dysuria: Secondary | ICD-10-CM

## 2019-11-02 DIAGNOSIS — I4819 Other persistent atrial fibrillation: Secondary | ICD-10-CM | POA: Diagnosis not present

## 2019-11-02 DIAGNOSIS — K5900 Constipation, unspecified: Secondary | ICD-10-CM

## 2019-11-02 DIAGNOSIS — G20 Parkinson's disease: Secondary | ICD-10-CM | POA: Diagnosis not present

## 2019-11-02 DIAGNOSIS — D649 Anemia, unspecified: Secondary | ICD-10-CM

## 2019-11-02 DIAGNOSIS — N3941 Urge incontinence: Secondary | ICD-10-CM

## 2019-11-02 DIAGNOSIS — K219 Gastro-esophageal reflux disease without esophagitis: Secondary | ICD-10-CM

## 2019-11-02 DIAGNOSIS — G20C Parkinsonism, unspecified: Secondary | ICD-10-CM

## 2019-11-02 NOTE — Assessment & Plan Note (Signed)
Pending urine culture.

## 2019-11-02 NOTE — Assessment & Plan Note (Signed)
GERD, stable, on Omeprazole 20mg qd.  

## 2019-11-02 NOTE — Assessment & Plan Note (Signed)
Afib, heart rate is in control, on Xarelto 20mg qd, Diltiazem 120mg qd.08/09/19 Cardiology f/u.  

## 2019-11-02 NOTE — Assessment & Plan Note (Signed)
Parkinson's stable, f/u neurology, last 07/18/19,on Sinemet.  

## 2019-11-02 NOTE — Assessment & Plan Note (Signed)
Urge incontinent of urine, takes Mirabegron.

## 2019-11-02 NOTE — Assessment & Plan Note (Signed)
Stable, continue MIraLax.  

## 2019-11-02 NOTE — Assessment & Plan Note (Addendum)
PVD/edema BLE, on Metolazone 2.5mg  3x/wk.Bun/creat 10/0.8 10/11/19

## 2019-11-02 NOTE — Progress Notes (Signed)
Location:   SNF [place] Room Number: 13-A Place of Service:  SNF (31) Provider: [facility] Naleyah Ohlinger NP  [name], MD  Patient Care Team: [name], MD as PCP - General (Internal Medicine) [name], [name], MD as PCP - Cardiology (Cardiology) [name], [name], DO as Consulting Physician (Neurology) Adams Hinch X, NP as Nurse Practitioner (Internal Medicine)  Extended Emergency Contact Information Primary Emergency Contact: Kyliana, Standen Mobile Phone: [phone] Relation: Daughter Interpreter needed? No Secondary Emergency Contact: [facility] Address: [address]          [place], [place] [address] [name] of [place] Phone: [phone] Mobile Phone: [phone] Relation: Son  Code Status:  DNR Goals of care: Advanced Directive information Advanced Directives 11/02/2019  Does Patient Have a Medical Advance Directive? Yes  Type of Paramedic of [place];Out of facility DNR (pink MOST or yellow form)  Does patient want to make changes to medical advance directive? No - Patient declined  Copy of [place] in Chart? Yes - validated most recent copy scanned in chart (See row information)  Would patient like information on creating a medical advance directive? -  Pre-existing out of facility DNR order (yellow form or pink MOST form) Yellow form placed in chart (order not valid for inpatient use)     Chief Complaint  Patient presents with   Medical Management of Chronic Issues    Routine [facility]    HPI:  Pt is a 79 y.o. female seen today for medical management of chronic diseases.    Urinary urgency, frequency, lower abd/back discomfort, pending urine culture.   Afib, heart rate is in control, on Xarelto 30m qd, Diltiazem 1291mqd.08/09/19 Cardiology f/u. R hip pain, on Tylenol 100073midprn, Methocarbamol 250m65m.  Parkinson's stable, f/u neurology, last  07/18/19,on Sinemet. Anemia, stable, Hgb 12.1 10/11/19,takes Fe GERD, stable, on Omeprazole 20mg4m Urge incontinent of urine, takes Mirabegron. Constipation, takes MiraLax PVD/edema BLE, on Metolazone 2.5mg 337mk.Bun/creat 20/0.8 10/11/19   Past Medical History:  Diagnosis Date   Acute lower UTI 09/14/2018   Anemia 10/15/2018   2016 colonoscopy 10/14/18 wbc 5.4, Hgb 10.1, plt 184 12/01/18 wbc 4.5, Hgb 11.1, plt 188, neutrophils 63,  Na 139, K 3.7, Bun 23, creat 0.69, eGFR 83 on Fe, Hgb 11.7 12/09/18    Atrial fibrillation (HCC) [place]   10/07/18 Na 143, K 4.0, Bun 20, creat 0.79, eGFR 72, wbc 7.1, Hgb 10.9, plt 172 10/28/18 Na 142, K 4.0, Bun 19, creat 0.77, eGFR 74 11/18/18 Na 144, K 4.1, Bun 21, creat 0.69, eGFR 83   Bilateral lower extremity edema 07/19/2018   11/02/18 BMP 2 weeks.    Cervical pain (neck) 06/20/2010   Closed fracture of part of upper end of humerus 05/01/2015   Colles' fracture of right radius 03/05/2015   Constipation 07/19/2018   DEGENERATIVE JOINT DISEASE, RIGHT HIP 02/16/2007   Dupuytren's contracture    Dysuria 09/20/2018   09/20/18 c/o got up several times last night to go urinate, burning on urination, lower abd /back discomfort, but urinary frequency, leakage are not new. UA C/S, Pyridium 100mg t48m 2 days.  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 If you have any questions, ask your nurse or doctor.        acetaminophen 500 MG tablet Commonly known as: TYLENOL Take 1,000 mg by mouth 3 (three) times daily as needed.   calcium carbonate 750 MG chewable tablet Commonly known as: TUMS EX Chew 1 tablet by mouth daily. Between the hours of 7am-10am.   carbidopa-levodopa 25-100 MG tablet Commonly known as: SINEMET IR Take 2 tablets by mouth 4 (four) times daily. Morning: 6:30 am, MidDay 10:30 am, Afternoon 2:30 pm, Evening 6:30 pm .   carbidopa-levodopa 50-200 MG tablet Commonly known as: SINEMET CR Take 1 tablet by mouth at bedtime. 9pm   diltiazem 120 MG 24 hr capsule Commonly known as: CARDIZEM CD Take 120 mg by mouth daily. Between the hours of 7am-10am. HOLD IF SBP < 100   ferrous sulfate 325 (65 FE) MG tablet Take 325 mg by mouth every Monday, Wednesday, and Friday. Give with food   hydrocortisone 2.5 % ointment Apply 1 application topically at bedtime as needed.   methocarbamol 500 MG tablet Commonly known as: ROBAXIN Take 250 mg by mouth daily. As needed   Myrbetriq 50 MG Tb24 tablet Generic drug: mirabegron ER Take 50 mg by mouth daily. Between the hours of 7am-10am.   omeprazole 20 MG capsule Commonly known as: PRILOSEC Take 20 mg by mouth daily.   polyethylene glycol 17 g packet Commonly known as: MIRALAX /  GLYCOLAX Take 17 g by mouth daily.   PreviDent 5000 Booster Plus 1.1 % Pste Generic drug: Sodium Fluoride Place 1 application onto teeth in the morning and at bedtime.   rivaroxaban 20 MG Tabs tablet Commonly known as: XARELTO Take 20 mg by mouth every evening. Between the hours of 5pm-6pm.   Vitamin D3 10 MCG (400 UNIT) Caps Take 1 capsule by mouth daily. Between the hours of 7am-10am.   zinc oxide 20 % ointment Apply 1 application topically as needed for irritation. To buttocks after every incontinent episode and as needed for redness. May keep at bedside.       Review of Systems  Constitutional: Positive for fatigue. Negative for appetite change and fever.  HENT: Positive for hearing loss. Negative for congestion and voice change.   Eyes: Negative for visual disturbance.  Respiratory: Negative for cough.   Cardiovascular: Positive for leg swelling.  Gastrointestinal: Positive for abdominal pain. Negative for abdominal distention and constipation.       Suprapubic area discomfort.  Genitourinary: Positive for dysuria, frequency and urgency. Negative for hematuria.  Musculoskeletal: Positive for arthralgias, back pain and gait problem. Negative for myalgias.       Right hip pain is well controlled. Lower back discomfort-may be related to sitting in w/c or possible of UTI/dysuria.   Skin: Negative for color change.       thick yellow right great toe nail fell off today.   Neurological: Positive for tremors. Negative for speech difficulty and light-headedness.       Moves slow, fine tremor in fingers, burning sensation  in the R+ L great toes when touched by sheets, comes/goes  Psychiatric/Behavioral: Negative for behavioral problems and sleep disturbance. The patient is not nervous/anxious.     Immunization History  Administered Date(s) Administered   Influenza Split 10/20/2012   Influenza Whole 10/21/2006   Influenza, High Dose Seasonal PF 10/17/2016    Influenza-Unspecified 10/20/2013, 10/22/2017   Moderna SARS-COVID-2 Vaccination 01/22/2019, 02/19/2019   Pneumococcal Conjugate-13 01/31/2013   Pneumococcal Polysaccharide-23 01/20/2005, 11/24/2011   Td 01/21/2004   Tdap 03/16/2014   Zoster 04/17/2009   Pertinent  Health Maintenance Due  Topic Date Due   COLONOSCOPY  06/12/2019   INFLUENZA VACCINE  08/21/2019   DEXA SCAN  Completed   PNA vac Low Risk Adult  Completed   Fall Risk  05/03/2018 11/03/2017 08/04/2017 03/06/2017 11/26/2016  Falls in the past year? 1 No No Yes Yes  Number falls in past yr: 0 - - 2 or more 2 or more  Injury with Fall? 0 - - No No  Risk Factor Category  - - - High Fall Risk High Fall Risk  Risk for fall due to : Other (Comment) - - - -  Follow up Falls evaluation completed;Education provided;Falls prevention discussed - - Falls evaluation completed Falls evaluation completed   Functional Status Survey:    Vitals:   11/02/19 1608  BP: 90/60  Pulse: 66  Resp: 18  Temp: (!) 97.5 F (36.4 C)  TempSrc: Oral  SpO2: 97%  Weight: 142 lb 1.6 oz (64.5 kg)  Height: _0  (1.727 m)   Body mass index is 21.61 kg/m. Physical Exam Vitals and nursing note reviewed.  Constitutional:      Appearance: Normal appearance.  HENT:     Head: Normocephalic and atraumatic.     Mouth/Throat:     Mouth: Mucous membranes are moist.  Eyes:     Extraocular Movements: Extraocular movements intact.     Conjunctiva/sclera: Conjunctivae normal.     Pupils: Pupils are equal, round, and reactive to light.  Cardiovascular:     Rate and Rhythm: Normal rate. Rhythm irregular.     Heart sounds: No murmur heard.      Comments: DP pulses present L>R Pulmonary:     Breath sounds: No rales.  Abdominal:     General: Bowel sounds are normal.     Palpations: Abdomen is soft.     Comments: Suprapubic area discomfort palpated   Musculoskeletal:        General: Tenderness present.     Cervical back: Normal range of motion  and neck supple.     Right lower leg: Edema present.     Left lower leg: Edema present.     Comments: trace edema LLE. Lower back discomfort palpated.   Skin:    General: Skin is warm and dry.     Comments: Yellow thick right great toe nail fell off today, not traumatic.   Neurological:  General: No focal deficit present.     Mental Status: She is alert and oriented to person, place, and time. Mental status is at baseline.     Coordination: Coordination abnormal.     Gait: Gait abnormal.     Comments: Moves slow, tremor in hands  Psychiatric:        Mood and Affect: Mood normal.        Behavior: Behavior normal.        Thought Content: Thought content normal.     Labs reviewed: Recent Labs    05/17/19 0000 05/17/19 0000 07/19/19 0000 09/06/19 0000 10/11/19 0000  NA 140   < > 138 139 141  K 3.6   < > 4.1 4.0 4.1  CL 103   < > 104 103 107  CO2 31*   < > 29* 29* 27*  BUN 22*   < > _0 CREATININE 0.7   < > 0.7 0.8 0.8  CALCIUM 9.4  --  8.9  --  9.4   < > = values in this interval not displayed.   Recent Labs    12/09/18 0000 12/09/18 0000 03/24/19 0000 04/01/19 0000 09/06/19 0000  AST 16   < > 11* 12* 9*  ALT 10  --  5*  --  3*  ALKPHOS 47   < > 50 48 62  ALBUMIN 3.8   < > 4.0 3.8 3.8   < > = values in this interval not displayed.   Recent Labs    12/09/18 0000 12/09/18 0000 03/24/19 0000 04/01/19 0000 05/17/19 0000 09/06/19 0000 10/11/19 0000  WBC 5.5   < > 8.4   < > 5.2 4.9 4.2  NEUTROABS 3,718  --  6,602  --   --  3,450  --   HGB 11.7*   < > 10.9*   < > 11.4* 11.7* 12.1  HCT 37   < > 34*   < > 34* 36 37  PLT 189   < > 190   < > 184 181 157   < > = values in this interval not displayed.   Lab Results  Component Value Date   TSH 0.408 09/11/2018   No results found for: HGBA1C Lab Results  Component Value Date   CHOL 185 03/24/2019   HDL 79 (A) 03/24/2019   LDLCALC 94 03/24/2019   LDLDIRECT 104.2 01/25/2013   TRIG 41 03/24/2019    CHOLHDL 2 08/29/2015    Significant Diagnostic Results in last 30 days:  No results found.  Assessment/Plan  Atrial fibrillation (HCC) Afib, heart rate is in control, on Xarelto 97m qd, Diltiazem 1287mqd.08/09/19 Cardiology f/u.   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   Parkinsonism (HCC)[place] stable, f/u neurology, last 07/18/19,on Sinemet.   Anemia Anemia, stable, Hgb 12.1 10/11/19,takes Fe  GERD (gastroesophageal reflux disease) GERD, stable, on Omeprazole 20mg17m   Urge incontinence of urine Urge incontinent of urine, takes Mirabegron.   Constipation Stable, continue MIraLax.   Bilateral lower extremity edema PVD/edema BLE, on Metolazone 2.5mg 375mk.Bun/creat 10/0.8 10/11/19   Dysuria Pending urine culture   Family/ staff Communication: plan of care reviewed with the patient and charge nurse.   Labs/tests ordered: none  Time spend 35 minutes.

## 2019-11-02 NOTE — Assessment & Plan Note (Signed)
R hip pain, on Tylenol 1000mg tidprn, Methocarbamol 250mg qd.   

## 2019-11-02 NOTE — Assessment & Plan Note (Addendum)
Anemia, stable, Hgb12.1 10/11/19,takes Fe  

## 2019-11-03 ENCOUNTER — Encounter: Payer: Self-pay | Admitting: Nurse Practitioner

## 2019-11-04 DIAGNOSIS — R531 Weakness: Secondary | ICD-10-CM | POA: Diagnosis not present

## 2019-11-04 DIAGNOSIS — R296 Repeated falls: Secondary | ICD-10-CM | POA: Diagnosis not present

## 2019-11-04 DIAGNOSIS — M6282 Rhabdomyolysis: Secondary | ICD-10-CM | POA: Diagnosis not present

## 2019-11-04 DIAGNOSIS — I480 Paroxysmal atrial fibrillation: Secondary | ICD-10-CM | POA: Diagnosis not present

## 2019-11-04 DIAGNOSIS — M329 Systemic lupus erythematosus, unspecified: Secondary | ICD-10-CM | POA: Diagnosis not present

## 2019-11-04 DIAGNOSIS — N390 Urinary tract infection, site not specified: Secondary | ICD-10-CM | POA: Diagnosis not present

## 2019-11-04 DIAGNOSIS — Z20828 Contact with and (suspected) exposure to other viral communicable diseases: Secondary | ICD-10-CM | POA: Diagnosis not present

## 2019-11-04 DIAGNOSIS — G20 Parkinson's disease: Secondary | ICD-10-CM | POA: Diagnosis not present

## 2019-11-04 DIAGNOSIS — R29898 Other symptoms and signs involving the musculoskeletal system: Secondary | ICD-10-CM | POA: Diagnosis not present

## 2019-11-08 DIAGNOSIS — R296 Repeated falls: Secondary | ICD-10-CM | POA: Diagnosis not present

## 2019-11-08 DIAGNOSIS — R531 Weakness: Secondary | ICD-10-CM | POA: Diagnosis not present

## 2019-11-08 DIAGNOSIS — I480 Paroxysmal atrial fibrillation: Secondary | ICD-10-CM | POA: Diagnosis not present

## 2019-11-08 DIAGNOSIS — N390 Urinary tract infection, site not specified: Secondary | ICD-10-CM | POA: Diagnosis not present

## 2019-11-08 DIAGNOSIS — M329 Systemic lupus erythematosus, unspecified: Secondary | ICD-10-CM | POA: Diagnosis not present

## 2019-11-08 DIAGNOSIS — Z20828 Contact with and (suspected) exposure to other viral communicable diseases: Secondary | ICD-10-CM | POA: Diagnosis not present

## 2019-11-08 DIAGNOSIS — R29898 Other symptoms and signs involving the musculoskeletal system: Secondary | ICD-10-CM | POA: Diagnosis not present

## 2019-11-08 DIAGNOSIS — M6282 Rhabdomyolysis: Secondary | ICD-10-CM | POA: Diagnosis not present

## 2019-11-08 DIAGNOSIS — G20 Parkinson's disease: Secondary | ICD-10-CM | POA: Diagnosis not present

## 2019-11-09 ENCOUNTER — Encounter: Payer: Self-pay | Admitting: Nurse Practitioner

## 2019-11-09 ENCOUNTER — Non-Acute Institutional Stay (SKILLED_NURSING_FACILITY): Payer: Medicare PPO | Admitting: Nurse Practitioner

## 2019-11-09 DIAGNOSIS — K5900 Constipation, unspecified: Secondary | ICD-10-CM | POA: Diagnosis not present

## 2019-11-09 DIAGNOSIS — N3000 Acute cystitis without hematuria: Secondary | ICD-10-CM | POA: Diagnosis not present

## 2019-11-09 DIAGNOSIS — I4819 Other persistent atrial fibrillation: Secondary | ICD-10-CM | POA: Diagnosis not present

## 2019-11-09 DIAGNOSIS — D631 Anemia in chronic kidney disease: Secondary | ICD-10-CM

## 2019-11-09 DIAGNOSIS — N1831 Chronic kidney disease, stage 3a: Secondary | ICD-10-CM

## 2019-11-09 DIAGNOSIS — K219 Gastro-esophageal reflux disease without esophagitis: Secondary | ICD-10-CM | POA: Diagnosis not present

## 2019-11-09 DIAGNOSIS — G20 Parkinson's disease: Secondary | ICD-10-CM

## 2019-11-09 DIAGNOSIS — M159 Polyosteoarthritis, unspecified: Secondary | ICD-10-CM

## 2019-11-09 DIAGNOSIS — I872 Venous insufficiency (chronic) (peripheral): Secondary | ICD-10-CM

## 2019-11-09 DIAGNOSIS — N3941 Urge incontinence: Secondary | ICD-10-CM

## 2019-11-09 NOTE — Assessment & Plan Note (Signed)
R hip pain, on Tylenol 1000mg  tidprn, Methocarbamol 250mg  qd.

## 2019-11-09 NOTE — Assessment & Plan Note (Signed)
UTI, improving symptoms, complete 7 day course of Keflex 500mg  bid started 11/04/19.

## 2019-11-09 NOTE — Assessment & Plan Note (Signed)
Constipation, takes MiraLax

## 2019-11-09 NOTE — Progress Notes (Signed)
Location:   SNF [place] Room Number: 13 Place of Service:  SNF (31) Provider: [facility] Luigi Stuckey NP  [name], MD  Patient Care Team: [name], MD as PCP - General (Internal Medicine) [name], [name], MD as PCP - Cardiology (Cardiology) [name], [name], DO as Consulting Physician (Neurology) Akiko Schexnider X, NP as Nurse Practitioner (Internal Medicine)  Extended Emergency Contact Information Primary Emergency Contact: [place], [place] Mobile Phone: [phone] Relation: Daughter Interpreter needed? No Secondary Emergency Contact: [facility] Address: [address]          [place], [place] [address] [name] of [place] Phone: [phone] Mobile Phone: [phone] Relation: Son  Code Status: DNR Goals of care: Advanced Directive information Advanced Directives 11/09/2019  Does Patient Have a Medical Advance Directive? Yes  Type of Advance Directive [place]  Does patient want to make changes to medical advance directive? No - Patient declined  Copy of [place] in Chart? -  Would patient like information on creating a medical advance directive? -  Pre-existing out of facility DNR order (yellow form or pink MOST form) -     Chief Complaint  Patient presents with  . Acute Visit    Weight gain    HPI:  Pt is a 79 y.o. female seen today for an acute visit for weight gain, #144Ibs today, then #141 yesterday, then #142 11/02/19, #141 10/31/19,  #142Ibs 10.8/21. the patient has chronic edema BLE, no significant change in my eyes. The patient denied cough, SOB, or phlegm production. The patient admitted she has more clothing/shoes when she was weighed this morning. Off Metolazone 09/27/19.   PVD/edema BLE, off  Metolazone 2.35m 3x/wk  UTI, improving symptoms, complete 7 day course of Keflex 5065mbid started 11/04/19.   Afib, heart rate is in control, on Xarelto 2018md, Diltiazem 120m28m.08/09/19 Cardiology  f/u. R hip pain, on Tylenol 1000mg42mprn, Methocarbamol 250mg 17m Parkinson's stable, f/u neurology, last 07/18/19,on Sinemet. Anemia, stable, Hgb 12.1 10/11/19,takes Fe GERD, stable, on Omeprazole 20mg q31mrge incontinent of urine, takes Mirabegron. Constipation, takes MiraLax  Past Medical History:  Diagnosis Date  . Acute lower UTI 09/14/2018  . Anemia 10/15/2018   2016 colonoscopy 10/14/18 wbc 5.4, Hgb 10.1, plt 184 12/01/18 wbc 4.5, Hgb 11.1, plt 188, neutrophils 63,  Na 139, K 3.7, Bun 23, creat 0.69, eGFR 83 on Fe, Hgb 11.7 12/09/18   . Atrial fibrillation (HCC) 5/[place]012   10/07/18 Na 143, K 4.0, Bun 20, creat 0.79, eGFR 72, wbc 7.1, Hgb 10.9, plt 172 10/28/18 Na 142, K 4.0, Bun 19, creat 0.77, eGFR 74 11/18/18 Na 144, K 4.1, Bun 21, creat 0.69, eGFR 83  . Bilateral lower extremity edema 07/19/2018   11/02/18 BMP 2 weeks.   . Cervical pain (neck) 06/20/2010  . Closed fracture of part of upper end of humerus 05/01/2015  . Colles' fracture of right radius 03/05/2015  . Constipation 07/19/2018  . DEGENERATIVE JOINT DISEASE, RIGHT HIP 02/16/2007  . Dupuytren's contracture   . Dysuria 09/20/2018   09/20/18 c/o got up several times last night to go urinate, burning on urination, lower abd /back discomfort, but urinary frequency, leakage are not new. UA C/S, Pyridium 100mg ti59m2 days.  09/21/18 wbc 6.1, Hgb 11.8, plt 210, neutrophil 69.4, Na 142, K 4.1, Bun 19, creat 0.78, TP 6.4, albumin 3.9  . Hematuria 02/04/2014  . Hypotension 09/18/2018  . Long term (current) use of  anticoagulants 05/29/2016  . Osteoarthritis of hip    right  . Osteoarthritis of left knee   . Overactive bladder 10/06/2018  . Parkinson disease ([place])   . Paroxysmal A-fib ([place])   . POSTMENOPAUSAL SYNDROME 02/16/2007  . PREMATURE ATRIAL CONTRACTIONS 02/16/2007  . 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 S/P breast biopsy, left    two  o'clock position - benign  . 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 Tremor, unspecified 10/19/2015  . Unstable gait 02/16/2017  . Vaginal atrophy 10/19/2015  . Venous insufficiency   . Weakness 09/12/2018   Past Surgical History:  Procedure Laterality Date  . BREAST BIOPSY Left   . CATARACT EXTRACTION, BILATERAL      Allergies  Allergen Reactions  . Doxycycline Nausea And Vomiting  . Sulfonamide Derivatives     REACTION: HIVES    Allergies as of 11/09/2019      Reactions   Doxycycline Nausea And Vomiting   Sulfonamide Derivatives    REACTION: HIVES      Medication List       Accurate as of November 09, 2019 11:59 PM. If you have any questions, ask your nurse or doctor.        acetaminophen 500 MG tablet Commonly known as: TYLENOL Take 1,000 mg by mouth 3 (three) times daily as needed.   calcium carbonate 750 MG chewable tablet Commonly known as: TUMS EX Chew 1 tablet by mouth daily. Between the hours of 7am-10am.   carbidopa-levodopa 25-100 MG tablet Commonly known as: SINEMET IR Take 2 tablets by mouth 4 (four) times daily. Morning: 6:30 am, MidDay 10:30 am, Afternoon 2:30 pm, Evening 6:30 pm .   carbidopa-levodopa 50-200 MG tablet Commonly known as: SINEMET CR Take 1 tablet by mouth at bedtime. 9pm   cephALEXin 500 MG capsule Commonly known as: KEFLEX Take 500 mg by mouth 2 (two) times daily. For 7 days starting 11/04/2019   diltiazem 120 MG 24 hr capsule Commonly known as: CARDIZEM CD Take 120 mg by mouth daily. Between the hours of 7am-10am. HOLD IF SBP < 100   ferrous sulfate 325 (65 FE) MG tablet Take 325 mg by mouth every Monday, Wednesday, and Friday. Give with food   hydrocortisone 2.5 % ointment Apply 1 application topically at bedtime as needed.   methocarbamol 500 MG tablet Commonly known as: ROBAXIN Take 250 mg by mouth daily. As needed   Myrbetriq 50 MG Tb24 tablet Generic drug: mirabegron ER Take 50 mg by mouth daily. Between the  hours of 7am-10am.   omeprazole 20 MG capsule Commonly known as: PRILOSEC Take 20 mg by mouth daily.   polyethylene glycol 17 g packet Commonly known as: MIRALAX / GLYCOLAX Take 17 g by mouth daily.   PreviDent 5000 Booster Plus 1.1 % Pste Generic drug: Sodium Fluoride Place 1 application onto teeth in the morning and at bedtime.   rivaroxaban 20 MG Tabs tablet Commonly known as: XARELTO Take 20 mg by mouth every evening. Between the hours of 5pm-6pm.   saccharomyces boulardii 250 MG capsule Commonly known as: FLORASTOR Take 250 mg by mouth 2 (two) times daily.   Vitamin D3 10 MCG (400 UNIT) Caps Take 1 capsule by mouth daily. Between the hours of 7am-10am.   zinc oxide 20 % ointment Apply 1 application topically as needed for irritation. To buttocks after every incontinent episode and as needed for redness. May keep at bedside.       Review of Systems  Constitutional: Positive  for unexpected weight change. Negative for fatigue and fever.  HENT: Positive for hearing loss. Negative for congestion and voice change.   Eyes: Negative for visual disturbance.  Respiratory: Negative for cough, chest tightness, shortness of breath and wheezing.   Cardiovascular: Positive for leg swelling.  Gastrointestinal: Negative for abdominal distention, abdominal pain and constipation.  Genitourinary: Positive for frequency and urgency. Negative for dysuria and hematuria.  Musculoskeletal: Positive for arthralgias, back pain and gait problem. Negative for myalgias.       Right hip pain is well controlled. Lower back discomfort-may be related to sitting in w/c or possible of UTI/dysuria.   Skin: Negative for color change.  Neurological: Positive for tremors. Negative for dizziness, speech difficulty and headaches.       Moves slow, fine tremor in fingers, burning sensation  in the R+ L great toes when touched by sheets, comes/goes  Psychiatric/Behavioral: Negative for behavioral problems and  sleep disturbance. The patient is not nervous/anxious.     Immunization History  Administered Date(s) Administered  . Influenza Split 10/20/2012  . Influenza Whole 10/21/2006  . Influenza, High Dose Seasonal PF 10/17/2016, 11/02/2019  . Influenza-Unspecified 10/20/2013, 10/22/2017  . Moderna SARS-COVID-2 Vaccination 01/22/2019, 02/19/2019  . Pneumococcal Conjugate-13 01/31/2013  . Pneumococcal Polysaccharide-23 01/20/2005, 11/24/2011  . Td 01/21/2004  . Tdap 03/16/2014  . Zoster 04/17/2009   Pertinent  Health Maintenance Due  Topic Date Due  . COLONOSCOPY  06/12/2019  . INFLUENZA VACCINE  Completed  . DEXA SCAN  Completed  . PNA vac Low Risk Adult  Completed   Fall Risk  05/03/2018 11/03/2017 08/04/2017 03/06/2017 11/26/2016  Falls in the past year? 1 No No Yes Yes  Number falls in past yr: 0 - - 2 or more 2 or more  Injury with Fall? 0 - - No No  Risk Factor Category  - - - High Fall Risk High Fall Risk  Risk for fall due to : Other (Comment) - - - -  Follow up Falls evaluation completed;Education provided;Falls prevention discussed - - Falls evaluation completed Falls evaluation completed   Functional Status Survey:    Vitals:   11/09/19 1324  BP: 102/60  Pulse: 84  Resp: 18  Temp: (!) 97.3 F (36.3 C)  SpO2: 98%  Weight: 143 lb 14.4 oz (65.3 kg)  Height: _0  (1.727 m)   Body mass index is 21.88 kg/m. Physical Exam Vitals and nursing note reviewed.  Constitutional:      Appearance: Normal appearance.  HENT:     Head: Normocephalic and atraumatic.     Mouth/Throat:     Mouth: Mucous membranes are moist.  Eyes:     Extraocular Movements: Extraocular movements intact.     Conjunctiva/sclera: Conjunctivae normal.     Pupils: Pupils are equal, round, and reactive to light.  Cardiovascular:     Rate and Rhythm: Normal rate. Rhythm irregular.     Heart sounds: No murmur heard.      Comments: DP pulses present L>R Pulmonary:     Breath sounds: No wheezing,  rhonchi or rales.  Abdominal:     General: Bowel sounds are normal.     Palpations: Abdomen is soft.  Musculoskeletal:     Cervical back: Normal range of motion and neck supple.     Right lower leg: Edema present.     Left lower leg: Edema present.     Comments: trace edema LLE.   Skin:    General: Skin is warm and dry.  Neurological:     General: No focal deficit present.     Mental Status: She is alert and oriented to person, place, and time. Mental status is at baseline.     Coordination: Coordination abnormal.     Gait: Gait abnormal.     Comments: Moves slow, tremor in hands  Psychiatric:        Mood and Affect: Mood normal.        Behavior: Behavior normal.        Thought Content: Thought content normal.     Labs reviewed: Recent Labs    05/17/19 0000 05/17/19 0000 07/19/19 0000 09/06/19 0000 10/11/19 0000  NA 140   < > 138 139 141  K 3.6   < > 4.1 4.0 4.1  CL 103   < > 104 103 107  CO2 31*   < > 29* 29* 27*  BUN 22*   < > _0 CREATININE 0.7   < > 0.7 0.8 0.8  CALCIUM 9.4  --  8.9  --  9.4   < > = values in this interval not displayed.   Recent Labs    12/09/18 0000 12/09/18 0000 03/24/19 0000 04/01/19 0000 09/06/19 0000  AST 16   < > 11* 12* 9*  ALT 10  --  5*  --  3*  ALKPHOS 47   < > 50 48 62  ALBUMIN 3.8   < > 4.0 3.8 3.8   < > = values in this interval not displayed.   Recent Labs    12/09/18 0000 12/09/18 0000 03/24/19 0000 04/01/19 0000 05/17/19 0000 09/06/19 0000 10/11/19 0000  WBC 5.5   < > 8.4   < > 5.2 4.9 4.2  NEUTROABS 3,718  --  6,602  --   --  3,450  --   HGB 11.7*   < > 10.9*   < > 11.4* 11.7* 12.1  HCT 37   < > 34*   < > 34* 36 37  PLT 189   < > 190   < > 184 181 157   < > = values in this interval not displayed.   Lab Results  Component Value Date   TSH 0.408 09/11/2018   No results found for: HGBA1C Lab Results  Component Value Date   CHOL 185 03/24/2019   HDL 79 (A) 03/24/2019   LDLCALC 94 03/24/2019    LDLDIRECT 104.2 01/25/2013   TRIG 41 03/24/2019   CHOLHDL 2 08/29/2015    Significant Diagnostic Results in last 30 days:  No results found.  Assessment/Plan: Edema of left lower extremity due to peripheral venous insufficiency for weight gain, #144Ibs today, then #141 yesterday, then #142 11/02/19, #141 10/31/19,  #142Ibs 10.8/21. the patient has chronic edema BLE, no significant change in my eyes. The patient denied cough, SOB, or phlegm production. The patient admitted she has more clothing/shoes when she was weighed this morning. Off Metolazone 09/27/19.  Will weight the patient in am ac breakfast. Observe for swelling in legs.   UTI (urinary tract infection) UTI, improving symptoms, complete 7 day course of Keflex 5104m bid started 11/04/19.    Atrial fibrillation (HCC) Afib, heart rate is in control, on Xarelto 259mqd, Diltiazem 12083md.08/09/19 Cardiology f/u.   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   Anemia Anemia, stable,  Hgb 12.1 10/11/19,takes Fe   GERD (gastroesophageal reflux disease) GERD, stable, on Omeprazole 28m qd.   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  Constipation Constipation, takes MTax adviserCommunication: plan of care reviewed with the patient and charge nurse.   Labs/tests ordered: none  Time spend 35 minutes.

## 2019-11-09 NOTE — Assessment & Plan Note (Signed)
Urge incontinent of urine, takes Mirabegron.  

## 2019-11-09 NOTE — Assessment & Plan Note (Signed)
Anemia, stable, Hgb 12.1 10/11/19,takes Fe

## 2019-11-09 NOTE — Assessment & Plan Note (Signed)
Afib, heart rate is in control, on Xarelto 20mg  qd, Diltiazem 120mg  qd.08/09/19 Cardiology f/u.

## 2019-11-09 NOTE — Assessment & Plan Note (Signed)
for weight gain, #144Ibs today, then #141 yesterday, then #142 11/02/19, #141 10/31/19,  #142Ibs 10.8/21. the patient has chronic edema BLE, no significant change in my eyes. The patient denied cough, SOB, or phlegm production. The patient admitted she has more clothing/shoes when she was weighed this morning. Off Metolazone 09/27/19.  Will weight the patient in am ac breakfast. Observe for swelling in legs.

## 2019-11-09 NOTE — Assessment & Plan Note (Signed)
Parkinson's stable, f/u neurology, last 07/18/19,on Sinemet.

## 2019-11-09 NOTE — Assessment & Plan Note (Signed)
GERD, stable, on Omeprazole 20mg qd.  

## 2019-11-10 ENCOUNTER — Encounter: Payer: Self-pay | Admitting: Nurse Practitioner

## 2019-11-10 DIAGNOSIS — L570 Actinic keratosis: Secondary | ICD-10-CM | POA: Diagnosis not present

## 2019-11-10 DIAGNOSIS — L814 Other melanin hyperpigmentation: Secondary | ICD-10-CM | POA: Diagnosis not present

## 2019-11-10 DIAGNOSIS — L821 Other seborrheic keratosis: Secondary | ICD-10-CM | POA: Diagnosis not present

## 2019-11-11 DIAGNOSIS — R296 Repeated falls: Secondary | ICD-10-CM | POA: Diagnosis not present

## 2019-11-11 DIAGNOSIS — N390 Urinary tract infection, site not specified: Secondary | ICD-10-CM | POA: Diagnosis not present

## 2019-11-11 DIAGNOSIS — G20 Parkinson's disease: Secondary | ICD-10-CM | POA: Diagnosis not present

## 2019-11-11 DIAGNOSIS — R531 Weakness: Secondary | ICD-10-CM | POA: Diagnosis not present

## 2019-11-11 DIAGNOSIS — Z20828 Contact with and (suspected) exposure to other viral communicable diseases: Secondary | ICD-10-CM | POA: Diagnosis not present

## 2019-11-11 DIAGNOSIS — I480 Paroxysmal atrial fibrillation: Secondary | ICD-10-CM | POA: Diagnosis not present

## 2019-11-11 DIAGNOSIS — M329 Systemic lupus erythematosus, unspecified: Secondary | ICD-10-CM | POA: Diagnosis not present

## 2019-11-11 DIAGNOSIS — M6282 Rhabdomyolysis: Secondary | ICD-10-CM | POA: Diagnosis not present

## 2019-11-11 DIAGNOSIS — R29898 Other symptoms and signs involving the musculoskeletal system: Secondary | ICD-10-CM | POA: Diagnosis not present

## 2019-11-15 DIAGNOSIS — M6282 Rhabdomyolysis: Secondary | ICD-10-CM | POA: Diagnosis not present

## 2019-11-15 DIAGNOSIS — R296 Repeated falls: Secondary | ICD-10-CM | POA: Diagnosis not present

## 2019-11-15 DIAGNOSIS — N390 Urinary tract infection, site not specified: Secondary | ICD-10-CM | POA: Diagnosis not present

## 2019-11-15 DIAGNOSIS — I480 Paroxysmal atrial fibrillation: Secondary | ICD-10-CM | POA: Diagnosis not present

## 2019-11-15 DIAGNOSIS — Z20828 Contact with and (suspected) exposure to other viral communicable diseases: Secondary | ICD-10-CM | POA: Diagnosis not present

## 2019-11-15 DIAGNOSIS — R531 Weakness: Secondary | ICD-10-CM | POA: Diagnosis not present

## 2019-11-15 DIAGNOSIS — M329 Systemic lupus erythematosus, unspecified: Secondary | ICD-10-CM | POA: Diagnosis not present

## 2019-11-15 DIAGNOSIS — G20 Parkinson's disease: Secondary | ICD-10-CM | POA: Diagnosis not present

## 2019-11-15 DIAGNOSIS — R29898 Other symptoms and signs involving the musculoskeletal system: Secondary | ICD-10-CM | POA: Diagnosis not present

## 2019-11-18 DIAGNOSIS — H04123 Dry eye syndrome of bilateral lacrimal glands: Secondary | ICD-10-CM | POA: Diagnosis not present

## 2019-11-18 DIAGNOSIS — N390 Urinary tract infection, site not specified: Secondary | ICD-10-CM | POA: Diagnosis not present

## 2019-11-18 DIAGNOSIS — R296 Repeated falls: Secondary | ICD-10-CM | POA: Diagnosis not present

## 2019-11-18 DIAGNOSIS — M6282 Rhabdomyolysis: Secondary | ICD-10-CM | POA: Diagnosis not present

## 2019-11-18 DIAGNOSIS — H524 Presbyopia: Secondary | ICD-10-CM | POA: Diagnosis not present

## 2019-11-18 DIAGNOSIS — H02055 Trichiasis without entropian left lower eyelid: Secondary | ICD-10-CM | POA: Diagnosis not present

## 2019-11-18 DIAGNOSIS — R531 Weakness: Secondary | ICD-10-CM | POA: Diagnosis not present

## 2019-11-18 DIAGNOSIS — M329 Systemic lupus erythematosus, unspecified: Secondary | ICD-10-CM | POA: Diagnosis not present

## 2019-11-18 DIAGNOSIS — I480 Paroxysmal atrial fibrillation: Secondary | ICD-10-CM | POA: Diagnosis not present

## 2019-11-18 DIAGNOSIS — H0100A Unspecified blepharitis right eye, upper and lower eyelids: Secondary | ICD-10-CM | POA: Diagnosis not present

## 2019-11-18 DIAGNOSIS — G20 Parkinson's disease: Secondary | ICD-10-CM | POA: Diagnosis not present

## 2019-11-18 DIAGNOSIS — R29898 Other symptoms and signs involving the musculoskeletal system: Secondary | ICD-10-CM | POA: Diagnosis not present

## 2019-11-18 DIAGNOSIS — Z20828 Contact with and (suspected) exposure to other viral communicable diseases: Secondary | ICD-10-CM | POA: Diagnosis not present

## 2019-11-21 ENCOUNTER — Encounter: Payer: Self-pay | Admitting: Family Medicine

## 2019-11-21 ENCOUNTER — Ambulatory Visit: Payer: Medicare PPO | Admitting: Family Medicine

## 2019-11-21 VITALS — BP 110/64 | HR 65

## 2019-11-21 DIAGNOSIS — G20 Parkinson's disease: Secondary | ICD-10-CM | POA: Diagnosis not present

## 2019-11-21 DIAGNOSIS — R251 Tremor, unspecified: Secondary | ICD-10-CM

## 2019-11-21 DIAGNOSIS — I952 Hypotension due to drugs: Secondary | ICD-10-CM

## 2019-11-21 DIAGNOSIS — Z7409 Other reduced mobility: Secondary | ICD-10-CM

## 2019-11-21 NOTE — Patient Instructions (Signed)
Below is our plan:  We will continue Sinemet IR 2 tablets four times daily. Continue Sinemet CR 50/200mg  daily at bedtime. I would like for you to continue physical activity at least 15 minutes three times daily.   Please make sure you are staying well hydrated. I recommend 50-60 ounces daily. Well balanced diet and regular exercise encouraged.    Please continue follow up with care team as directed.   Follow up in 3-4 months   You may receive a survey regarding today's visit. I encourage you to leave honest feed back as I do use this information to improve patient care. Thank you for seeing me today!     Carbidopa; Levodopa tablets What is this medicine? CARBIDOPA;LEVODOPA (kar bi DOE pa; lee voe DOE pa) is used to treat the symptoms of Parkinson's disease. This medicine may be used for other purposes; ask your health care provider or pharmacist if you have questions. COMMON BRAND NAME(S): Atamet, SINEMET What should I tell my health care provider before I take this medicine? They need to know if you have any of these conditions:  depression or other mental illness  diabetes  glaucoma  heart disease, including history of a heart attack  history of irregular heartbeat  kidney disease  liver disease  lung or breathing disease, like asthma  narcolepsy  sleep apnea  stomach or intestine problems  an unusual or allergic reaction to levodopa, carbidopa, other medicines, foods, dyes, or preservatives  pregnant or trying to get pregnant  breast-feeding How should I use this medicine? Take this medicine by mouth with a glass of water. Follow the directions on the prescription label. Take your doses at regular intervals. Do not take your medicine more often than directed. Do not stop taking except on the advice of your doctor or health care professional. Talk to your pediatrician regarding the use of this medicine in children. Special care may be needed. Overdosage: If  you think you have taken too much of this medicine contact a poison control center or emergency room at once. NOTE: This medicine is only for you. Do not share this medicine with others. What if I miss a dose? If you miss a dose, take it as soon as you can. If it is almost time for your next dose, take only that dose. Do not take double or extra doses. What may interact with this medicine? Do not take this medicine with any of the following medications:  MAOIs like Marplan, Nardil, and Parnate  reserpine  tetrabenazine This medicine may also interact with the following medications:  alcohol  droperidol  entacapone  iron supplements or multivitamins with iron  isoniazid, INH  linezolid  medicines for depression, anxiety, or psychotic disturbances  medicines for high blood pressure  medicines for sleep  metoclopramide  papaverine  procarbazine  tedizolid  rasagiline  selegiline  tolcapone This list may not describe all possible interactions. Give your health care provider a list of all the medicines, herbs, non-prescription drugs, or dietary supplements you use. Also tell them if you smoke, drink alcohol, or use illegal drugs. Some items may interact with your medicine. What should I watch for while using this medicine? Visit your health care professional for regular checks on your progress. Tell your health care professional if your symptoms do not start to get better or if they get worse. Do not stop taking except on your health care professional's advice. You may develop a severe reaction. Your health care professional will  tell you how much medicine to take. You may experience a wearing of effect prior to the time for your next dose of this medicine. You may also experience an on-off effect where the medicine apparently stops working for anything from a minute to several hours, then suddenly starts working again. Tell your doctor or health care professional if any of  these symptoms happen to you. Your dose may need to be changed. A high protein diet can slow or prevent absorption of this medicine. Avoid high protein foods near the time of taking this medicine to help to prevent these problems. Take this medicine at least 30 minutes before eating or one hour after meals. You may want to eat higher protein foods later in the day or in small amounts. Discuss your diet with your doctor or health care professional or nutritionist. You may get drowsy or dizzy. Do not drive, use machinery, or do anything that needs mental alertness until you know how this drug affects you. Do not stand or sit up quickly, especially if you are an older patient. This reduces the risk of dizzy or fainting spells. Alcohol may interfere with the effect of this medicine. Avoid alcoholic drinks. When taking this medicine, you may fall asleep without notice. You may be doing activities like driving a car, talking, or eating. You may not feel drowsy before it happens. Contact your health care provider right away if this happens to you. There have been reports of increased sexual urges or other strong urges such as gambling while taking this medicine. If you experience any of these while taking this medicine, you should report this to your health care provider as soon as possible. If you are diabetic, this medicine may interfere with the accuracy of some tests for sugar or ketones in the urine (does not interfere with blood tests). Check with your doctor or health care professional before changing the dose of your diabetic medicine. This medicine may discolor the urine or sweat, making it look darker or red in color. This is of no cause for concern. However, this may stain clothing or fabrics. This medicine may cause a decrease in vitamin B6. You should make sure that you get enough vitamin B6 while you are taking this medicine. Discuss the foods you eat and the vitamins you take with your health care  professional. What side effects may I notice from receiving this medicine? Side effects that you should report to your doctor or health care professional as soon as possible:  allergic reactions like skin rash, itching or hives, swelling of the face, lips, or tongue  changes in emotions or moods  falling asleep during normal activities like driving  fast, irregular heartbeat  feeling faint or lightheaded, falls  fever  hallucinations  new or increased gambling urges, sexual urges, uncontrolled spending, binge or compulsive eating, or other urges  stomach pain  trouble passing urine or change in the amount of urine  uncontrollable movements of the arms, face, head, mouth, neck, or upper body Side effects that usually do not require medical attention (report to your doctor or health care professional if they continue or are bothersome):  dizziness  headache  loss of appetite  nausea  trouble sleeping This list may not describe all possible side effects. Call your doctor for medical advice about side effects. You may report side effects to FDA at 1-800-FDA-1088. Where should I keep my medicine? Keep out of the reach of children. Store at room temperature between  15 and 30 degrees C (59 and 86 degrees F). Protect from light. Throw away any unused medicine after the expiration date. NOTE: This sheet is a summary. It may not cover all possible information. If you have questions about this medicine, talk to your doctor, pharmacist, or health care provider.  2020 Elsevier/Gold Standard (2018-09-13 19:49:59)    Parkinson's Disease Parkinson's disease causes problems with movements. It is a long-term condition. It gets worse over time (is progressive). It affects each person in different ways. It makes it harder for you to:  Control how your body moves.  Move your body normally. The condition can range from mild to very bad (advanced). What are the causes? This condition  results from a loss of brain cells called neurons. These brain cells make a chemical called dopamine, which is needed to control body movement. As the condition gets worse, the brain cells make less dopamine. This makes it hard to move or control your movements. The exact cause of this condition is not known. What increases the risk?  Being female.  Being age 68 or older.  Having family members who had Parkinson's disease.  Having had an injury to the brain.  Being very sad (depressed).  Being around things that are harmful or poisonous. What are the signs or symptoms? Symptoms of this condition can vary. The main symptoms have to do with movement. These include:  A tremor or shaking while you are resting that you cannot control.  Stiffness in your neck, arms, and legs.  Slowing of movement. This may include: ? Losing expressions of the face. ? Having trouble making small movements that are needed to button your clothing or brush your teeth.  Walking in a way that is not normal. You may walk with short, shuffling steps.  Loss of balance when standing. You may sway, fall backward, or have trouble making turns. Other symptoms include:  Being very sad, worried, or confused.  Seeing or hearing things that are not real.  Losing thinking abilities (dementia).  Trouble speaking or swallowing.  Having a hard time pooping (constipation).  Needing to pee right away, peeing often, or not being able to control when you pee or poop.  Sleep problems. How is this treated? There is no cure. The goal of treatment is to manage your symptoms. Treatment may include:  Medicines.  Therapy to help with talking or movement.  Surgery to reduce shaking and other movements that you cannot control. Follow these instructions at home: Medicines  Take over-the-counter and prescription medicines only as told by your doctor.  Avoid taking pain or sleeping medicines. Eating and  drinking  Follow instructions from your doctor about what you cannot eat or drink.  Do not drink alcohol. Activity  Talk with your doctor about if it is safe for you to drive.  Do exercises as told by your doctor. Lifestyle      Put in grab bars and railings in your home. These help to prevent falls.  Do not use any products that contain nicotine or tobacco, such as cigarettes, e-cigarettes, and chewing tobacco. If you need help quitting, ask your doctor.  Join a support group. General instructions  Talk with your doctor about what you need help with and what your safety needs are.  Keep all follow-up visits as told by your doctor, including any therapy visits to help with talking or moving. This is important. Contact a doctor if:  Medicines do not help your symptoms.  You  feel off-balance.  You fall at home.  You need more help at home.  You have trouble swallowing.  You have a very hard time pooping.  You have a lot of side effects from your medicines.  You feel very sad, worried, or confused. Get help right away if:  You were hurt in a fall.  You see or hear things that are not real.  You cannot swallow without choking.  You have chest pain or trouble breathing.  You do not feel safe at home.  You have thoughts about hurting yourself or others. If you ever feel like you may hurt yourself or others, or have thoughts about taking your own life, get help right away. You can go to your nearest emergency department or call:  Your local emergency services (911 in the [place]  A suicide crisis helpline, such as the [facility] at 937-856-1554. This is open 24 hours a day. Summary  This condition causes problems with movements.  It is a long-term condition. It gets worse over time.  There is no cure. Treatment focuses on managing your symptoms.  Talk with your doctor about what you need help with and what your safety needs  are.  Keep all follow-up visits as told by your doctor. This is important. This information is not intended to replace advice given to you by your health care provider. Make sure you discuss any questions you have with your health care provider. Document Revised: 03/25/2018 Document Reviewed: 03/25/2018 Elsevier Patient Education  [employer].

## 2019-11-21 NOTE — Progress Notes (Signed)
Chief Complaint  Patient presents with  . Follow-up    tx rm  . Tremors     HISTORY OF PRESENT ILLNESS: Today 11/21/19  Stephanie Shaw is a 79 y.o. female here today for follow up for PD. She was last seen by Dr Rexene Alberts 07/18/2019. She has continued Sinemet IR 25/124m two tablets four times daily and Sinemet CR 50/2060mdaily at bedtime. She has been hesitant to consider previously discussed dosing of Sinemet IR 1.5 tablets QID with CR dosing BID. She feels that symptoms are about the same. No changes. She continues to feel tremor and immobility are most bothersome symptoms. She has been working with PT 5 days a week for about 15 minutes each day. She reports that staff is shorthanded and CMAs have not been able to assist with moblization at other times throughout the day. She continues to have soft BM but denies diarrhea. She was recently treated for UTI. She is eating well. No difficulty swallowing. She is sleeping fairly well. She continues to have low blood pressure readings. Readings are usually 90's-100/60's. No dizziness, lightheadedness or falls. She uses gait belt with mobilization.    HISTORY (copied from Dr AtGuadelupe Sabinote on 07/18/2019)  Stephanie Shaw a [age]ear old right-handed woman with an underlying medical history of paroxysmal A. fib, osteoarthritis, chronic venous insufficiency and Dupuytren's contracture, who presents for follow-up consultation of her Parkinson's disease. The patient is unaccompanied today and came via transportation from SNF. I first met her on 01/12/2019 at the request of her primary care physician, at which time she reported a prior diagnosis of Parkinson's disease. She was advised to continue her medication regimen.   She saw AmDebbora Prestonurse practitioner in the interim on 05/16/2019, at which time her blood pressure was notably low. She was advised to decrease Sinemet IR to 1-1/2 pills 4 times a day and add a morning dose of Sinemet CR in addition to her  evening dose. She preferred to continue her current regimen.  The patient's allergies, current medications, family history, past medical history, past social history, past surgical history and problem list were reviewed and updated as appropriate.   Today, 07/18/2019: She reports that she feels overall weaker.  Her medication takes about an hour to kick in.  She takes Sinemet IR 2 pills 4 times a day, at 630, 1030, 230 and 630 and Sinemet CR at bedtime.  She reports that her appetite is good, she tries to hydrate with water.  She has not fallen thankfully.  She uses the Tangi with assistance of a CNA and a gait belt about 3 times a day when she goes to the dining hall.  She reports that her stool has been looser.  She takes Senokot and MiraLAX daily.  No recent issues with constipation.  Previously:   (She) was diagnosed with Parkinson's disease in 2018. She believes she has had symptoms for several years, does recall that she had left-sided symptoms in the beginning. Over time, she has had more stiffness, decline in mobility, constipation, low blood pressure issues, and has fallen. [facility]. She fell in the summer and feels that she had a more steeper decline since the summer. [facility], had a visit on 09/24/2018. She was on Sinemet 1.5 pills 4 times a day, 25-100 mg strength. She was hospitalized after several falls in August 2020. She was diagnosed with rhabdomyolysis.  She went to rehab after that. She had an abnormal DaTscan on 09/25/2016 and I reviewed the results: IMPRESSION: Poor uptake of dopamine transport specific radiotracer within the LEFT and RIGHT putamen and decrease relative uptake in the head of the RIGHT caudate nucleus is in pattern typical of Parkinson's syndrome.  She also had a telemedicine visit with Dr. Carles Collet on 12/23/2018 which I reviewed: Her Sinemet was changed to 2 pills 4 times a day  and she was advised to start Sinemet CR 50-200 mg strength at bedtime. She was advised to follow-up with her primary care physician regarding anxiety and depression symptoms and to follow-up with urology regarding her urinary incontinence.  She reports that she has an appointment with urology. She reports constipation and indicates that she has a bowel movement typically every other day, sometimes every third day. In reviewing her MAR, I do not see that she has any as needed medication for stool softener or laxative. She denies any hallucinations, she does have difficulty maintaining sleep. She indicates nocturia about once or twice per average night which is better since the summer. She also was treated for a UTI in the summer. She reports no significant difficulty falling asleep but she does wake up in the middle of the night and has trouble going back to sleep. She feels that the increase in the Sinemet and the addition of the long-acting Sinemet has been helpful. She has physical therapy at her skilled nursing facility. She usually is able to walk with her Kaziyah, she indicates that she has a 2 wheeled Demiana which she did not bring today, she is in a wheelchair.  She denies a family history of Parkinson's disease with the exception of one paternal cousin that had Parkinson's disease.    REVIEW OF SYSTEMS: Out of a complete 14 system review of symptoms, the patient complains only of the following symptoms, immobility, weakness, tremor and all other reviewed systems are negative.   ALLERGIES: Allergies  Allergen Reactions  . Doxycycline Nausea And Vomiting  . Sulfonamide Derivatives     REACTION: HIVES     HOME MEDICATIONS: Outpatient Medications Prior to Visit  Medication Sig Dispense Refill  . acetaminophen (TYLENOL) 500 MG tablet Take 1,000 mg by mouth 3 (three) times daily as needed.     . calcium carbonate (TUMS EX) 750 MG chewable tablet Chew 1 tablet by mouth daily. Between  the hours of 7am-10am.    . carbidopa-levodopa (SINEMET CR) 50-200 MG tablet Take 1 tablet by mouth at bedtime. 9pm    . carbidopa-levodopa (SINEMET IR) 25-100 MG tablet Take 2 tablets by mouth 4 (four) times daily. Morning: 6:30 am, MidDay 10:30 am, Afternoon 2:30 pm, Evening 6:30 pm .    . cephALEXin (KEFLEX) 500 MG capsule Take 500 mg by mouth 2 (two) times daily. For 7 days starting 11/04/2019    . Cholecalciferol (VITAMIN D3) 10 MCG (400 UNIT) CAPS Take 1 capsule by mouth daily. Between the hours of 7am-10am.    . diltiazem (CARDIZEM CD) 120 MG 24 hr capsule Take 120 mg by mouth daily. Between the hours of 7am-10am. HOLD IF SBP < 100    . ferrous sulfate 325 (65 FE) MG tablet Take 325 mg by mouth every Monday, Wednesday, and Friday. Give with food    . hydrocortisone 2.5 % ointment Apply 1 application topically at bedtime as needed.     . methocarbamol (ROBAXIN) 500 MG tablet Take 250 mg by mouth daily. As needed    .  mirabegron ER (MYRBETRIQ) 50 MG TB24 tablet Take 50 mg by mouth daily. Between the hours of 7am-10am.    . omeprazole (PRILOSEC) 20 MG capsule Take 20 mg by mouth daily.    . polyethylene glycol (MIRALAX / GLYCOLAX) 17 g packet Take 17 g by mouth daily.    . rivaroxaban (XARELTO) 20 MG TABS tablet Take 20 mg by mouth every evening. Between the hours of 5pm-6pm.    . saccharomyces boulardii (FLORASTOR) 250 MG capsule Take 250 mg by mouth 2 (two) times daily.    . Sodium Fluoride (PREVIDENT 5000 BOOSTER PLUS) 1.1 % PSTE Place 1 application onto teeth in the morning and at bedtime.    Marland Kitchen zinc oxide 20 % ointment Apply 1 application topically as needed for irritation. To buttocks after every incontinent episode and as needed for redness. May keep at bedside.     No facility-administered medications prior to visit.     PAST MEDICAL HISTORY: Past Medical History:  Diagnosis Date  . Acute lower UTI 09/14/2018  . Anemia 10/15/2018   2016 colonoscopy 10/14/18 wbc 5.4, Hgb 10.1, plt  184 12/01/18 wbc 4.5, Hgb 11.1, plt 188, neutrophils 63,  Na 139, K 3.7, Bun 23, creat 0.69, eGFR 83 on Fe, Hgb 11.7 12/09/18   . Atrial fibrillation ([place]) 05/21/2010   10/07/18 Na 143, K 4.0, Bun 20, creat 0.79, eGFR 72, wbc 7.1, Hgb 10.9, plt 172 10/28/18 Na 142, K 4.0, Bun 19, creat 0.77, eGFR 74 11/18/18 Na 144, K 4.1, Bun 21, creat 0.69, eGFR 83  . Bilateral lower extremity edema 07/19/2018   11/02/18 BMP 2 weeks.   . Cervical pain (neck) 06/20/2010  . Closed fracture of part of upper end of humerus 05/01/2015  . Colles' fracture of right radius 03/05/2015  . Constipation 07/19/2018  . DEGENERATIVE JOINT DISEASE, RIGHT HIP 02/16/2007  . Dupuytren's contracture   . Dysuria 09/20/2018   09/20/18 c/o got up several times last night to go urinate, burning on urination, lower abd /back discomfort, but urinary frequency, leakage are not new. UA C/S, Pyridium 117m tid x 2 days.  09/21/18 wbc 6.1, Hgb 11.8, plt 210, neutrophil 69.4, Na 142, K 4.1, Bun 19, creat 0.78, TP 6.4, albumin 3.9  . Hematuria 02/04/2014  . Hypotension 09/18/2018  . Long term (current) use of anticoagulants 05/29/2016  . Osteoarthritis of hip    right  . Osteoarthritis of left knee   . Overactive bladder 10/06/2018  . Parkinson disease ([place]   . [place]mal [place]fib [place]   . POSTMENOPAUSAL SYNDROME 02/16/2007  . PREMATURE ATRIAL CONTRACTIONS 02/16/2007  . 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 S/P breast biopsy, left    two o'clock position - benign  . 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 Tremor, unspecified 10/19/2015  . Unstable gait 02/16/2017  . Vaginal atrophy 10/19/2015  . Venous insufficiency   . Weakness 09/12/2018     PAST SURGICAL HISTORY: Past Surgical History:  Procedure Laterality Date  . BREAST BIOPSY Left   . CATARACT EXTRACTION, BILATERAL       FAMILY HISTORY: Family History  Problem Relation Age of Onset  . Cancer Father        ? type  . Alcohol abuse Father   . Cancer Mother         Pancreatic  . Diabetes Mother   . Heart failure Mother   . Osteoarthritis Sister   . Healthy Sister   . Healthy Child   . Breast  cancer Neg Hx      SOCIAL HISTORY: Social History   Socioeconomic History  . Marital status: Divorced    Spouse name: Not on file  . Number of children: 2  . Years of education: Not on file  . Highest education level: Master's degree (e.g., MA, MS, MEng, MEd, MSW, MBA)  Occupational History  . Occupation: retired    Comment: math, [employer]  Tobacco Use  . Smoking status: Former Smoker    Quit date: 08/27/1971    Years since quitting: 48.2  . Smokeless tobacco: Never Used  Vaping Use  . Vaping Use: Never used  Substance and Sexual Activity  . Alcohol use: No    Alcohol/week: 0.0 standard drinks  . Drug use: No  . Sexual activity: Not Currently    Comment: 1st intercourse 79 yo-Fewer than 5 partners  Other Topics Concern  . Not on file  Social History Narrative  . Not on file   Social Determinants of Health   Financial Resource Strain:   . Difficulty of Paying Living Expenses: Not on file  Food Insecurity:   . Worried About Charity fundraiser in the Last Year: Not on file  . Ran Out of Food in the Last Year: Not on file  Transportation Needs:   . Lack of Transportation (Medical): Not on file  . Lack of Transportation (Non-Medical): Not on file  Physical Activity:   . Days of Exercise per Week: Not on file  . Minutes of Exercise per Session: Not on file  Stress:   . Feeling of Stress : Not on file  Social Connections:   . Frequency of Communication with Friends and Family: Not on file  . Frequency of Social Gatherings with Friends and Family: Not on file  . Attends Religious Services: Not on file  . Active Member of Clubs or Organizations: Not on file  . Attends Archivist Meetings: Not on file  . Marital Status: Not on file  Intimate Partner Violence:   . Fear of Current or Ex-Partner: Not on file  . Emotionally Abused: Not  on file  . Physically Abused: Not on file  . Sexually Abused: Not on file      PHYSICAL EXAM  Vitals:   11/21/19 1544  BP: 110/64  Pulse: 65   There is no height or weight on file to calculate BMI.   Generalized: Well developed, in no acute distress, masked face  Cardiology: normal rate and rhythm  Respiratory: clear to auscultation bilaterally   Neurological examination  Mentation: Alert oriented to time, place, history taking. Follows all commands, bradyphrenia Cranial nerve II-XII: Pupils were equal round reactive to light. Extraocular movements were full, visual field were full on confrontational test. Facial sensation and strength were normal.  Motor: The motor testing reveals 4 over 5 strength of all 4 extremities. Bradykinesia noted of bilateral upper extremities. Tremor noted of head and right hand today. Difficulty with fine motor skills.  Sensory: Sensory testing is intact to soft touch on all 4 extremities. No evidence of extinction is noted.  Gait and station: Gait not assessed, she is in wheelchair, here alone, no assistive device.  Reflexes: Deep tendon reflexes are symmetric and normal bilaterally.     DIAGNOSTIC DATA (LABS, IMAGING, TESTING) - I reviewed patient records, labs, notes, testing and imaging myself where available.  Lab Results  Component Value Date   WBC 4.2 10/11/2019   HGB 12.1 10/11/2019   HCT 37 10/11/2019  MCV 93.9 09/13/2018   PLT 157 10/11/2019      Component Value Date/Time   NA 141 10/11/2019 0000   K 4.1 10/11/2019 0000   CL 107 10/11/2019 0000   CL 104 10/28/2018 0000   CO2 27 (A) 10/11/2019 0000   CO2 28 10/28/2018 0000   GLUCOSE 109 (H) 09/14/2018 0043   GLUCOSE 93 12/25/2005 0935   BUN 20 10/11/2019 0000   CREATININE 0.8 10/11/2019 0000   CREATININE 0.68 09/14/2018 0043   CREATININE 0.92 06/14/2015 0853   CALCIUM 9.4 10/11/2019 0000   CALCIUM 9.9 10/28/2018 0000   PROT 6.4 09/21/2018 0000   ALBUMIN 3.8 09/06/2019  0000   ALBUMIN 3.9 09/21/2018 0000   AST 9 (A) 09/06/2019 0000   ALT 3 (A) 09/06/2019 0000   ALKPHOS 62 09/06/2019 0000   BILITOT 0.6 09/13/2018 0201   GFRNONAA 61 09/06/2019 0000   GFRNONAA 72 10/07/2018 0000   GFRAA 71 09/06/2019 0000   Lab Results  Component Value Date   CHOL 185 03/24/2019   HDL 79 (A) 03/24/2019   LDLCALC 94 03/24/2019   LDLDIRECT 104.2 01/25/2013   TRIG 41 03/24/2019   CHOLHDL 2 08/29/2015   No results found for: HGBA1C Lab Results  Component Value Date   VITAMINB12 1,217 (H) 09/12/2018   Lab Results  Component Value Date   TSH 0.408 09/11/2018      ASSESSMENT AND PLAN  79 y.o. year old female  has a past medical history of Acute lower UTI (09/14/2018), Anemia (10/15/2018), Atrial fibrillation ([place]) (05/21/2010), Bilateral lower extremity edema (07/19/2018), Cervical pain (neck) (06/20/2010), Closed fracture of part of upper end of humerus (05/01/2015), Colles' fracture of right radius (03/05/2015), Constipation (07/19/2018), DEGENERATIVE JOINT DISEASE, RIGHT HIP (02/16/2007), Dupuytren's contracture, Dysuria (09/20/2018), Hematuria (02/04/2014), Hypotension (09/18/2018), Long term (current) use of anticoagulants (05/29/2016), Osteoarthritis of hip, Osteoarthritis of left knee, Overactive bladder (10/06/2018), Parkinson disease ([place]), Paroxysmal A-fib ([place]), POSTMENOPAUSAL SYNDROME (02/16/2007), PREMATURE ATRIAL CONTRACTIONS (02/16/2007), Primary osteoarthritis of right shoulder (04/27/2017), S/P breast biopsy, left, Toxic effect of venom(989.5) (07/27/2007), Tremor, unspecified (10/19/2015), Unstable gait (02/16/2017), Vaginal atrophy (10/19/2015), Venous insufficiency, and Weakness (09/12/2018). here with   Parkinson's disease ([place])  Tremor of face and hands  Immobility  Hypotension due to drugs  Jaxsyn continues to note waxing and waning tremor with progressive immobility since last being seen. She is very hesitant to consider change in medication dosing. I have encouraged  regular physical activity and recommend she participates in PT at least 15 minutes three times daily 5 days a week. I have discussed this recommendation with her family as [facility] may not be able to accommodate the recommendation due to short staffing. Family may consider private therapy if facility will accommodate.  I have discussed her case with Dr. Rexene Alberts.  Recommendation was made for referral to [facility] for second opinion and evaluation of possible nonpharmacological treatment options.  Uncertain if she is appropriate for DBS.  We will send for evaluation.  Healthy lifestyle habits encouraged.  She will follow-up in 3 to 4 months.  She verbalizes understanding and agreement with this plan.  I spent 20 minutes of face-to-face and non-face-to-face time with patient.  This included previsit chart review, lab review, study review, order entry, electronic health record documentation, patient education.    Debbora Presto, MSN, FNP-C 11/21/2019, 4:55 PM  [facility] [address] [place], [place] [address] [phone]

## 2019-11-22 ENCOUNTER — Encounter: Payer: Self-pay | Admitting: Family Medicine

## 2019-11-22 DIAGNOSIS — I480 Paroxysmal atrial fibrillation: Secondary | ICD-10-CM | POA: Diagnosis not present

## 2019-11-22 DIAGNOSIS — Z20828 Contact with and (suspected) exposure to other viral communicable diseases: Secondary | ICD-10-CM | POA: Diagnosis not present

## 2019-11-22 DIAGNOSIS — E785 Hyperlipidemia, unspecified: Secondary | ICD-10-CM | POA: Diagnosis not present

## 2019-11-22 DIAGNOSIS — R278 Other lack of coordination: Secondary | ICD-10-CM | POA: Diagnosis not present

## 2019-11-22 DIAGNOSIS — R296 Repeated falls: Secondary | ICD-10-CM | POA: Diagnosis not present

## 2019-11-22 DIAGNOSIS — M329 Systemic lupus erythematosus, unspecified: Secondary | ICD-10-CM | POA: Diagnosis not present

## 2019-11-22 DIAGNOSIS — R29898 Other symptoms and signs involving the musculoskeletal system: Secondary | ICD-10-CM | POA: Diagnosis not present

## 2019-11-22 DIAGNOSIS — G20 Parkinson's disease: Secondary | ICD-10-CM | POA: Diagnosis not present

## 2019-11-22 DIAGNOSIS — M6282 Rhabdomyolysis: Secondary | ICD-10-CM | POA: Diagnosis not present

## 2019-11-25 DIAGNOSIS — G20 Parkinson's disease: Secondary | ICD-10-CM | POA: Diagnosis not present

## 2019-11-25 DIAGNOSIS — R296 Repeated falls: Secondary | ICD-10-CM | POA: Diagnosis not present

## 2019-11-25 DIAGNOSIS — E785 Hyperlipidemia, unspecified: Secondary | ICD-10-CM | POA: Diagnosis not present

## 2019-11-25 DIAGNOSIS — R29898 Other symptoms and signs involving the musculoskeletal system: Secondary | ICD-10-CM | POA: Diagnosis not present

## 2019-11-25 DIAGNOSIS — R278 Other lack of coordination: Secondary | ICD-10-CM | POA: Diagnosis not present

## 2019-11-25 DIAGNOSIS — I480 Paroxysmal atrial fibrillation: Secondary | ICD-10-CM | POA: Diagnosis not present

## 2019-11-25 DIAGNOSIS — M329 Systemic lupus erythematosus, unspecified: Secondary | ICD-10-CM | POA: Diagnosis not present

## 2019-11-25 DIAGNOSIS — M6282 Rhabdomyolysis: Secondary | ICD-10-CM | POA: Diagnosis not present

## 2019-11-25 DIAGNOSIS — Z20828 Contact with and (suspected) exposure to other viral communicable diseases: Secondary | ICD-10-CM | POA: Diagnosis not present

## 2019-11-28 ENCOUNTER — Encounter: Payer: Self-pay | Admitting: Internal Medicine

## 2019-11-29 ENCOUNTER — Non-Acute Institutional Stay (SKILLED_NURSING_FACILITY): Payer: Medicare PPO | Admitting: Internal Medicine

## 2019-11-29 ENCOUNTER — Encounter: Payer: Self-pay | Admitting: Internal Medicine

## 2019-11-29 DIAGNOSIS — N3281 Overactive bladder: Secondary | ICD-10-CM

## 2019-11-29 DIAGNOSIS — I951 Orthostatic hypotension: Secondary | ICD-10-CM

## 2019-11-29 DIAGNOSIS — R600 Localized edema: Secondary | ICD-10-CM | POA: Diagnosis not present

## 2019-11-29 DIAGNOSIS — G20 Parkinson's disease: Secondary | ICD-10-CM | POA: Diagnosis not present

## 2019-11-29 DIAGNOSIS — D649 Anemia, unspecified: Secondary | ICD-10-CM | POA: Diagnosis not present

## 2019-11-29 DIAGNOSIS — I4819 Other persistent atrial fibrillation: Secondary | ICD-10-CM

## 2019-11-29 NOTE — Progress Notes (Signed)
Location:  [place] Room Number: 13-A Place of Service:  SNF ([id number])  Provider:   Code Status:  Goals of Care:  Advanced Directives 11/29/2019  Does Patient Have a Medical Advance Directive? Yes  Type of Advance Directive [place]  Does patient want to make changes to medical advance directive? No - Patient declined  Copy of [place] in Chart? Yes - validated most recent copy scanned in chart (See row information)  Would patient like information on creating a medical advance directive? -  Pre-existing out of facility DNR order (yellow form or pink MOST form) -     Chief Complaint  Patient presents with  . Medical Management of Chronic Issues    Routine [facility] visit    HPI: Patient is a 79 y.o. female seen today for medical management of chronic diseases.    Patient has a history of Parkinson disease diagnosed 2 years ago on Sinemet and follows withNeurology, paroxysmal A. fib on Xarelto,  hyperlipidemia, H/o Ovarian Cyst, Lower extremity edema,2 D echo with N EF Biatrial enlargementand Urinary Incontinence Had Chronic Right Hip Pain. 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  Parkinson Disease Patient want to know if she can try ER Sinemet in the morning to help her Symptoms .Per   Patient and  her daughter this was recommended by Dr. Guadelupe Sabin office.  I do not see that in the note from last visit.   Postural hypotension Continues to be the issue.  The nurses are holding Cardizem if her systolic blood pressures stays below 90.  Patient states that she does feel dizzy when she gets up from lying to sitting position Hearing loss Has been complaining of hearing loss recently since the nurses cleaning her ear for wax.  She says it related her hearing worse.  Denies any pain Right hip pain Is mostly resolved now Lower extremity  edema Controlled with TED hoses no diuretic due to low blood pressure and dizziness Past Medical History:  Diagnosis Date  . Acute lower UTI 09/14/2018  . Anemia 10/15/2018   2016 colonoscopy 10/14/18 wbc 5.4, Hgb 10.1, plt 184 12/01/18 wbc 4.5, Hgb 11.1, plt 188, neutrophils 63,  Na 139, K 3.7, Bun 23, creat 0.69, eGFR 83 on Fe, Hgb 11.7 12/09/18   . Atrial fibrillation ([place]) 05/21/2010   10/07/18 Na 143, K 4.0, Bun 20, creat 0.79, eGFR 72, wbc 7.1, Hgb 10.9, plt 172 10/28/18 Na 142, K 4.0, Bun 19, creat 0.77, eGFR 74 11/18/18 Na 144, K 4.1, Bun 21, creat 0.69, eGFR 83  . Bilateral lower extremity edema 07/19/2018   11/02/18 BMP 2 weeks.   . Cervical pain (neck) 06/20/2010  . Closed fracture of part of upper end of humerus 05/01/2015  . Colles' fracture of right radius 03/05/2015  . Constipation 07/19/2018  . DEGENERATIVE JOINT DISEASE, RIGHT HIP 02/16/2007  . Dupuytren's contracture   . Dysuria 09/20/2018   09/20/18 c/o got up several times last night to go urinate, burning on urination, lower abd /back discomfort, but urinary frequency, leakage are not new. UA C/S, Pyridium 125m tid x 2 days.  09/21/18 wbc 6.1, Hgb 11.8, plt 210, neutrophil 69.4, Na 142, K 4.1, Bun 19, creat 0.78, TP 6.4, albumin 3.9  . Hematuria 02/04/2014  . Hypotension 09/18/2018  . Long term (current) use of anticoagulants 05/29/2016  . Osteoarthritis of hip    right  .  Osteoarthritis of left knee   . Overactive bladder 10/06/2018  . Parkinson disease ([place])   . Paroxysmal A-fib ([place])   . POSTMENOPAUSAL SYNDROME 02/16/2007  . PREMATURE ATRIAL CONTRACTIONS 02/16/2007  . 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 S/P breast biopsy, left    two o'clock position - benign  . 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 Tremor, unspecified 10/19/2015  . Unstable gait 02/16/2017  . Vaginal atrophy 10/19/2015  . Venous insufficiency   . Weakness 09/12/2018    Past Surgical History:  Procedure Laterality Date  . BREAST BIOPSY  Left   . CATARACT EXTRACTION, BILATERAL      Allergies  Allergen Reactions  . Doxycycline Nausea And Vomiting  . Sulfonamide Derivatives     REACTION: HIVES    Outpatient Encounter Medications as of 11/29/2019  Medication Sig  . acetaminophen (TYLENOL) 500 MG tablet Take 1,000 mg by mouth 3 (three) times daily as needed.   . calcium carbonate (TUMS EX) 750 MG chewable tablet Chew 1 tablet by mouth daily. Between the hours of 7am-10am.  . carbidopa-levodopa (SINEMET CR) 50-200 MG tablet Take 1 tablet by mouth 2 (two) times daily.   . carbidopa-levodopa (SINEMET IR) 25-100 MG tablet Take 2 tablets by mouth 4 (four) times daily. Morning: 6:30 am, MidDay 10:30 am, Afternoon 2:30 pm, Evening 6:30 pm .  . Cholecalciferol (VITAMIN D3) 10 MCG (400 UNIT) CAPS Take 1 capsule by mouth daily. Between the hours of 7am-10am.  . diltiazem (CARDIZEM CD) 120 MG 24 hr capsule Take 120 mg by mouth daily. Between the hours of 7am-10am. HOLD IF SBP < 100  . ferrous sulfate 325 (65 FE) MG tablet Take 325 mg by mouth every Monday, Wednesday, and Friday. Give with food  . hydrocortisone 2.5 % ointment Apply 1 application topically at bedtime as needed.   . methocarbamol (ROBAXIN) 500 MG tablet Take 250 mg by mouth daily. As needed  . mirabegron ER (MYRBETRIQ) 50 MG TB24 tablet Take 50 mg by mouth daily. Between the hours of 7am-10am.  . omeprazole (PRILOSEC) 20 MG capsule Take 20 mg by mouth daily.  . polyethylene glycol (MIRALAX / GLYCOLAX) 17 g packet Take 17 g by mouth daily.  . rivaroxaban (XARELTO) 20 MG TABS tablet Take 20 mg by mouth every evening. Between the hours of 5pm-6pm.  . Sodium Fluoride (PREVIDENT 5000 BOOSTER PLUS) 1.1 % PSTE Place 1 application onto teeth in the morning and at bedtime.  Marland Kitchen zinc oxide 20 % ointment Apply 1 application topically as needed for irritation. To buttocks after every incontinent episode and as needed for redness. May keep at bedside.  . [DISCONTINUED] cephALEXin  (KEFLEX) 500 MG capsule Take 500 mg by mouth 2 (two) times daily. For 7 days starting 11/04/2019  . [DISCONTINUED] saccharomyces boulardii (FLORASTOR) 250 MG capsule Take 250 mg by mouth 2 (two) times daily.   No facility-administered encounter medications on file as of 11/29/2019.    Review of Systems:  Review of Systems  Constitutional: Positive for activity change.  HENT: Positive for hearing loss. Negative for ear discharge and ear pain.   Respiratory: Negative.   Cardiovascular: Positive for leg swelling.  Gastrointestinal: Negative.   Genitourinary: Negative.   Musculoskeletal: Positive for gait problem.  Skin: Negative.   Neurological: Positive for dizziness.  Psychiatric/Behavioral: Negative.     Health Maintenance  Topic Date Due  . Hepatitis C Screening  Never done  . COLONOSCOPY  06/12/2019  . TETANUS/TDAP  03/16/2024  .  INFLUENZA VACCINE  Completed  . DEXA SCAN  Completed  . COVID-19 Vaccine  Completed  . PNA vac Low Risk Adult  Completed    Physical Exam: Vitals:   11/29/19 1515  BP: 112/70  Pulse: 78  Resp: 18  Temp: 97.6 F (36.4 C)  TempSrc: Oral  SpO2: 96%  Weight: 143 lb 9.6 oz (65.1 kg)  Height: '5\' 8"'  (1.727 m)   Body mass index is 21.83 kg/m. Physical Exam Vitals reviewed.  Constitutional:      Appearance: Normal appearance.  HENT:     Head: Normocephalic.     Right Ear: Tympanic membrane normal.     Left Ear: Tympanic membrane normal.     Ears:     Comments: Wax in both ears    Nose: Nose normal.     Mouth/Throat:     Mouth: Mucous membranes are moist.     Pharynx: Oropharynx is clear.  Eyes:     Pupils: Pupils are equal, round, and reactive to light.  Cardiovascular:     Rate and Rhythm: Normal rate. Rhythm irregular.     Pulses: Normal pulses.  Pulmonary:     Effort: Pulmonary effort is normal.     Breath sounds: Normal breath sounds.  Abdominal:     General: Abdomen is flat. Bowel sounds are normal.     Palpations: Abdomen  is soft.  Musculoskeletal:        General: No swelling.     Cervical back: Neck supple.  Skin:    General: Skin is warm and dry.  Neurological:     General: No focal deficit present.     Mental Status: She is alert and oriented to person, place, and time.  Psychiatric:        Mood and Affect: Mood normal.        Thought Content: Thought content normal.     Labs reviewed: Basic Metabolic Panel: Recent Labs    05/17/19 0000 05/17/19 0000 07/19/19 0000 09/06/19 0000 10/11/19 0000  NA 140   < > 138 139 141  K 3.6   < > 4.1 4.0 4.1  CL 103   < > 104 103 107  CO2 31*   < > 29* 29* 27*  BUN 22*   < > '20 16 20  ' CREATININE 0.7   < > 0.7 0.8 0.8  CALCIUM 9.4  --  8.9  --  9.4   < > = values in this interval not displayed.   Liver Function Tests: Recent Labs    12/09/18 0000 12/09/18 0000 03/24/19 0000 04/01/19 0000 09/06/19 0000  AST 16   < > 11* 12* 9*  ALT 10  --  5*  --  3*  ALKPHOS 47   < > 50 48 62  ALBUMIN 3.8   < > 4.0 3.8 3.8   < > = values in this interval not displayed.   No results for input(s): LIPASE, AMYLASE in the last 8760 hours. No results for input(s): AMMONIA in the last 8760 hours. CBC: Recent Labs    12/09/18 0000 12/09/18 0000 03/24/19 0000 04/01/19 0000 05/17/19 0000 09/06/19 0000 10/11/19 0000  WBC 5.5   < > 8.4   < > 5.2 4.9 4.2  NEUTROABS 3,718  --  6,602  --   --  3,450  --   HGB 11.7*   < > 10.9*   < > 11.4* 11.7* 12.1  HCT 37   < > 34*   < >  34* 36 37  PLT 189   < > 190   < > 184 181 157   < > = values in this interval not displayed.   Lipid Panel: Recent Labs    12/09/18 0000 03/24/19 0000  CHOL 185 185  HDL 78* 79*  LDLCALC 94 94  TRIG 45 41   No results found for: HGBA1C  Procedures since last visit: No results found.  Assessment/Plan Parkinsonism, unspecified Parkinsonism type ([place]) Change Sinemet to ER in the morning and Evening  with one and one half tablets of IR 4 times a day Will let Neurology  know Orthostatic hypotension Wants to wait before considering Midodrine  Persistent atrial fibrillation (HCC) Cardizem and Xarelto  Bilateral lower extremity edema Ted hoses Taken off diurtetics due to Low bp and dizziness Overactive bladder Doing well Myrbetriq Anemia, unspecified type Doing well on Iron Hgb 12.1 Does need Colonoscopy for screening Hearing loss Most likely due to impacted wax Will try Debrox and then removal and Eval by Facility Audiologist Constipation Doing well on Miralax  Labs/tests ordered:  * No order type specified * Next appt:  Visit date not found   Total time spent in this patient care encounter was  45  minutes; greater than 50% of the visit spent counseling patient and staff, reviewing records , Labs and coordinating care for problems addressed at this encounter.

## 2019-12-06 ENCOUNTER — Encounter: Payer: Self-pay | Admitting: Internal Medicine

## 2019-12-06 ENCOUNTER — Non-Acute Institutional Stay (SKILLED_NURSING_FACILITY): Payer: Medicare PPO | Admitting: Internal Medicine

## 2019-12-06 DIAGNOSIS — N3281 Overactive bladder: Secondary | ICD-10-CM

## 2019-12-06 DIAGNOSIS — G20 Parkinson's disease: Secondary | ICD-10-CM | POA: Diagnosis not present

## 2019-12-06 DIAGNOSIS — R600 Localized edema: Secondary | ICD-10-CM | POA: Diagnosis not present

## 2019-12-06 DIAGNOSIS — I951 Orthostatic hypotension: Secondary | ICD-10-CM

## 2019-12-06 DIAGNOSIS — I4819 Other persistent atrial fibrillation: Secondary | ICD-10-CM

## 2019-12-06 DIAGNOSIS — D649 Anemia, unspecified: Secondary | ICD-10-CM

## 2019-12-06 NOTE — Progress Notes (Signed)
Location:  [place] Room Number: 13-A Place of Service:  SNF [phone])  Provider: Veleta Miners, MD  Code Status: DNR Goals of Care:  Advanced Directives 12/06/2019  Does Patient Have a Medical Advance Directive? Yes  Type of Advance Directive [place]  Does patient want to make changes to medical advance directive? No - Patient declined  Copy of [place] in Chart? Yes - validated most recent copy scanned in chart (See row information)  Would patient like information on creating a medical advance directive? -  Pre-existing out of facility DNR order (yellow form or pink MOST form) -     Chief Complaint  Patient presents with  . Medical Management of Chronic Issues    Routine [facility] visit  . Quality Metric Gaps    Colonoscopy  . Best Practice Recommendations    Hepatitis C screen    HPI: Patient is a 79 y.o. female seen today for medical management of chronic diseases.    Patient has a history of Parkinson disease diagnosed 2 years ago on Sinemet and follows withNeurology, paroxysmal A. fib on Xarelto,  hyperlipidemia, H/o Ovarian Cyst, Lower extremity edema,2 D echo with N EF Biatrial enlargement  Urinary Incontinence Had Chronic Right Hip Pain. 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  Recently changed her SinemetCR to BID and reduced her IR dose to 1.5 from 2 doses. Patient says she is doing better in the morning but now having some Neck Muscle cramps in the afternoon. Symptoms were bad enough that she could not work with therapy. Tylenol did help BP continues to be issue. Morning BP today was 88/60. She also c/o Postural Dizziness in the morning Also having hearing issues.  Past Medical History:  Diagnosis Date  . Acute lower UTI 09/14/2018  . Anemia 10/15/2018   2016 colonoscopy 10/14/18 wbc 5.4, Hgb 10.1, plt 184 12/01/18 wbc  4.5, Hgb 11.1, plt 188, neutrophils 63,  Na 139, K 3.7, Bun 23, creat 0.69, eGFR 83 on Fe, Hgb 11.7 12/09/18   . Atrial fibrillation ([place]) 05/21/2010   10/07/18 Na 143, K 4.0, Bun 20, creat 0.79, eGFR 72, wbc 7.1, Hgb 10.9, plt 172 10/28/18 Na 142, K 4.0, Bun 19, creat 0.77, eGFR 74 11/18/18 Na 144, K 4.1, Bun 21, creat 0.69, eGFR 83  . Bilateral lower extremity edema 07/19/2018   11/02/18 BMP 2 weeks.   . Cervical pain (neck) 06/20/2010  . Closed fracture of part of upper end of humerus 05/01/2015  . Colles' fracture of right radius 03/05/2015  . Constipation 07/19/2018  . DEGENERATIVE JOINT DISEASE, RIGHT HIP 02/16/2007  . Dupuytren's contracture   . Dysuria 09/20/2018   09/20/18 c/o got up several times last night to go urinate, burning on urination, lower abd /back discomfort, but urinary frequency, leakage are not new. UA C/S, Pyridium 116m tid x 2 days.  09/21/18 wbc 6.1, Hgb 11.8, plt 210, neutrophil 69.4, Na 142, K 4.1, Bun 19, creat 0.78, TP 6.4, albumin 3.9  . Hematuria 02/04/2014  . Hypotension 09/18/2018  . Long term (current) use of anticoagulants 05/29/2016  . Osteoarthritis of hip    right  . Osteoarthritis of left knee   . Overactive bladder 10/06/2018  . Parkinson disease ([place]   . [place]ox[place]mal [place]fib ([place]   . POSTMENOPAUSAL SYNDROME 02/16/2007  . PREMATURE ATRIAL CONTRACTIONS 02/16/2007  . 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  S/P breast biopsy, left    two o'clock position - benign  . 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 Tremor, unspecified 10/19/2015  . Unstable gait 02/16/2017  . Vaginal atrophy 10/19/2015  . Venous insufficiency   . Weakness 09/12/2018    Past Surgical History:  Procedure Laterality Date  . BREAST BIOPSY Left   . CATARACT EXTRACTION, BILATERAL      Allergies  Allergen Reactions  . Doxycycline Nausea And Vomiting  . Sulfonamide Derivatives     REACTION: HIVES    Outpatient Encounter Medications as of 12/06/2019  Medication Sig  .  acetaminophen (TYLENOL) 500 MG tablet Take 1,000 mg by mouth 3 (three) times daily as needed.   . calcium carbonate (TUMS EX) 750 MG chewable tablet Chew 1 tablet by mouth daily. Between the hours of 7am-10am.  . carbidopa-levodopa (SINEMET CR) 50-200 MG tablet Take 1 tablet by mouth See admin instructions. Give AM dose of 50/100 (1 tab) along with 6:30 dose of 25/100 1.5 tabs  . carbidopa-levodopa (SINEMET IR) 25-100 MG tablet Take 1.5 tablets by mouth 4 (four) times daily. Morning: 6:30 am, MidDay 10:30 am, Afternoon 2:30 pm, Evening 6:30 pm .  . Cholecalciferol (VITAMIN D3) 10 MCG (400 UNIT) CAPS Take 1 capsule by mouth daily. Between the hours of 7am-10am.  . diltiazem (CARDIZEM CD) 120 MG 24 hr capsule Take 120 mg by mouth daily. Between the hours of 7am-10am. HOLD IF SBP < 100  . ferrous sulfate 325 (65 FE) MG tablet Take 325 mg by mouth every Monday, Wednesday, and Friday. Give with food  . hydrocortisone 2.5 % ointment Apply 1 application topically at bedtime as needed.   . methocarbamol (ROBAXIN) 500 MG tablet Take 250 mg by mouth daily. As needed  . mirabegron ER (MYRBETRIQ) 50 MG TB24 tablet Take 50 mg by mouth daily. Between the hours of 7am-10am.  . omeprazole (PRILOSEC) 20 MG capsule Take 20 mg by mouth daily.  . polyethylene glycol (MIRALAX / GLYCOLAX) 17 g packet Take 17 g by mouth daily.  . rivaroxaban (XARELTO) 20 MG TABS tablet Take 20 mg by mouth every evening. Between the hours of 5pm-6pm.  . Sodium Fluoride (PREVIDENT 5000 BOOSTER PLUS) 1.1 % PSTE Place 1 application onto teeth in the morning and at bedtime.  Marland Kitchen zinc oxide 20 % ointment Apply 1 application topically as needed for irritation. To buttocks after every incontinent episode and as needed for redness. May keep at bedside.   No facility-administered encounter medications on file as of 12/06/2019.    Review of Systems:  Review of Systems  Constitutional: Positive for activity change and fatigue.  HENT: Negative.     Respiratory: Negative.   Cardiovascular: Positive for leg swelling.  Gastrointestinal: Negative.   Genitourinary: Negative.   Musculoskeletal: Positive for gait problem, neck pain and neck stiffness.  Skin: Negative.   Neurological: Negative for dizziness.  Psychiatric/Behavioral: Negative.     Health Maintenance  Topic Date Due  . Hepatitis C Screening  Never done  . COLONOSCOPY  06/12/2019  . TETANUS/TDAP  03/16/2024  . INFLUENZA VACCINE  Completed  . DEXA SCAN  Completed  . COVID-19 Vaccine  Completed  . PNA vac Low Risk Adult  Completed    Physical Exam: Vitals:   12/06/19 1535  BP: 100/68  Pulse: 73  Resp: 18  Temp: (!) 96.6 F (35.9 C)  TempSrc: Oral  SpO2: 100%  Weight: 142 lb 6.4 oz (64.6 kg)  Height: '5\' 8"'  (1.727 m)  Body mass index is 21.65 kg/m. Physical Exam Vitals reviewed.  Constitutional:      Appearance: Normal appearance.  HENT:     Head: Normocephalic.     Ears:     Comments: Wax blocking both ears    Nose: Nose normal.     Mouth/Throat:     Mouth: Mucous membranes are moist.     Pharynx: Oropharynx is clear.  Eyes:     Pupils: Pupils are equal, round, and reactive to light.  Cardiovascular:     Rate and Rhythm: Normal rate. Rhythm irregular.  Pulmonary:     Effort: Pulmonary effort is normal.     Breath sounds: Normal breath sounds.  Abdominal:     General: Abdomen is flat. Bowel sounds are normal.     Palpations: Abdomen is soft.  Musculoskeletal:        General: Swelling present.     Cervical back: Neck supple.  Skin:    General: Skin is warm.  Neurological:     General: No focal deficit present.     Mental Status: She is alert.  Psychiatric:        Mood and Affect: Mood normal.     Labs reviewed: Basic Metabolic Panel: Recent Labs    05/17/19 0000 05/17/19 0000 07/19/19 0000 09/06/19 0000 10/11/19 0000  NA 140   < > 138 139 141  K 3.6   < > 4.1 4.0 4.1  CL 103   < > 104 103 107  CO2 31*   < > 29* 29* 27*  BUN  22*   < > '20 16 20  ' CREATININE 0.7   < > 0.7 0.8 0.8  CALCIUM 9.4  --  8.9  --  9.4   < > = values in this interval not displayed.   Liver Function Tests: Recent Labs    12/09/18 0000 12/09/18 0000 03/24/19 0000 04/01/19 0000 09/06/19 0000  AST 16   < > 11* 12* 9*  ALT 10  --  5*  --  3*  ALKPHOS 47   < > 50 48 62  ALBUMIN 3.8   < > 4.0 3.8 3.8   < > = values in this interval not displayed.   No results for input(s): LIPASE, AMYLASE in the last 8760 hours. No results for input(s): AMMONIA in the last 8760 hours. CBC: Recent Labs    12/09/18 0000 12/09/18 0000 03/24/19 0000 04/01/19 0000 05/17/19 0000 09/06/19 0000 10/11/19 0000  WBC 5.5   < > 8.4   < > 5.2 4.9 4.2  NEUTROABS 3,718  --  6,602  --   --  3,450  --   HGB 11.7*   < > 10.9*   < > 11.4* 11.7* 12.1  HCT 37   < > 34*   < > 34* 36 37  PLT 189   < > 190   < > 184 181 157   < > = values in this interval not displayed.   Lipid Panel: Recent Labs    12/09/18 0000 03/24/19 0000  CHOL 185 185  HDL 78* 79*  LDLCALC 94 94  TRIG 45 41   No results found for: HGBA1C  Procedures since last visit: No results found.  Assessment/Plan Parkinsonism, unspecified Parkinsonism type ([place]) D/W Dr Rexene Alberts And will hold off increasing her IR Sinemet further due to Low BP. Will continue to see how she does with her increased CR   Orthostatic hypotension Some readings still less then  90 SBP Will check BP 3 / a day to see the pattern  Persistent atrial fibrillation (HCC) Continue Xarelto and Cardizem low dose Some times Nurses are holding her Cardizem due to low BP Her HR is staying in control  Bilateral lower extremity edema Off diuretics due to Low BP Continue Ted hoses Overactive bladder On Mybetriq Anemia, unspecified type On iron Does need repeat GI work up .Ear Wax Is going to be evaluated by Auditory in the facility    Labs/tests ordered:  * No order type specified * Next appt:  Visit date not  found   Total time spent in this patient care encounter was  _45  minutes; greater than 50% of the visit spent counseling patient and staff, reviewing records , Labs and coordinating care for problems addressed at this encounter.

## 2019-12-13 ENCOUNTER — Encounter: Payer: Self-pay | Admitting: Internal Medicine

## 2019-12-13 ENCOUNTER — Non-Acute Institutional Stay (SKILLED_NURSING_FACILITY): Payer: Medicare PPO | Admitting: Internal Medicine

## 2019-12-13 DIAGNOSIS — R600 Localized edema: Secondary | ICD-10-CM | POA: Diagnosis not present

## 2019-12-13 DIAGNOSIS — N3281 Overactive bladder: Secondary | ICD-10-CM | POA: Diagnosis not present

## 2019-12-13 DIAGNOSIS — I4819 Other persistent atrial fibrillation: Secondary | ICD-10-CM | POA: Diagnosis not present

## 2019-12-13 DIAGNOSIS — I872 Venous insufficiency (chronic) (peripheral): Secondary | ICD-10-CM | POA: Diagnosis not present

## 2019-12-13 DIAGNOSIS — G20 Parkinson's disease: Secondary | ICD-10-CM | POA: Diagnosis not present

## 2019-12-13 DIAGNOSIS — D649 Anemia, unspecified: Secondary | ICD-10-CM

## 2019-12-13 DIAGNOSIS — I951 Orthostatic hypotension: Secondary | ICD-10-CM | POA: Diagnosis not present

## 2019-12-13 NOTE — Progress Notes (Signed)
Location: [facility] manager of Service:  SNF (31)  Provider:   Code Status:  Goals of Care:  Advanced Directives 12/06/2019  Does Patient Have a Medical Advance Directive? Yes  Type of Advance Directive [place]  Does patient want to make changes to medical advance directive? No - Patient declined  Copy of [place] in Chart? Yes - validated most recent copy scanned in chart (See row information)  Would patient like information on creating a medical advance directive? -  Pre-existing out of facility DNR order (yellow form or pink MOST form) -     Chief Complaint  Patient presents with   Acute Visit    HPI: Patient is a 79 y.o. female seen today for an acute visit for follow up of her Sinemet dosing  Patient has a history of Parkinson disease diagnosed 2 years ago on Sinemet and follows withNeurology, paroxysmal A. fib on Xarelto,  hyperlipidemia, H/o Ovarian Cyst, Lower extremity edema,2 D echo with N EF Biatrial enlargement  Urinary Incontinence HadChronic Right Hip Pain. 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  Recent change of her Sinemet to  CR BID and IR to 1.5 tabs  4 times a day from 2 tabs QID  Patient is doing well 9 in the morning but now having severe symptoms starting at around 1:00 in afternoon.  States she gets a dose at 6:30 in the evening Blood pressure in the morning is still around 88 but it does come up to like 100 later in the day No other complaints today Discussed all this with the daughter   Past Medical History:  Diagnosis Date   Acute lower UTI 09/14/2018   Anemia 10/15/2018   2016 colonoscopy 10/14/18 wbc 5.4, Hgb 10.1, plt 184 12/01/18 wbc 4.5, Hgb 11.1, plt 188, neutrophils 63,  Na 139, K 3.7, Bun 23, creat 0.69, eGFR 83 on Fe, Hgb 11.7 12/09/18    Atrial fibrillation ([place]) 05/21/2010   10/07/18 Na 143, K 4.0, Bun 20, creat 0.79,  eGFR 72, wbc 7.1, Hgb 10.9, plt 172 10/28/18 Na 142, K 4.0, Bun 19, creat 0.77, eGFR 74 11/18/18 Na 144, K 4.1, Bun 21, creat 0.69, eGFR 83   Bilateral lower extremity edema 07/19/2018   11/02/18 BMP 2 weeks.    Cervical pain (neck) 06/20/2010   Closed fracture of part of upper end of humerus 05/01/2015   Colles' fracture of right radius 03/05/2015   Constipation 07/19/2018   DEGENERATIVE JOINT DISEASE, RIGHT HIP 02/16/2007   Dupuytren's contracture    Dysuria 09/20/2018   09/20/18 c/o got up several times last night to go urinate, burning on urination, lower abd /back discomfort, but urinary frequency, leakage are not new. UA C/S, Pyridium 170m tid x 2 days.  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/2017   S/P breast biopsy, left    two o'clock position - benign   Toxic effect of venom(989.5)  07/27/2007   Tremor, unspecified 10/19/2015   Unstable gait 02/16/2017   Vaginal atrophy 10/19/2015   Venous insufficiency    Weakness 09/12/2018    Past Surgical History:  Procedure Laterality Date   BREAST BIOPSY Left    CATARACT EXTRACTION, BILATERAL      Allergies  Allergen Reactions   Doxycycline Nausea And Vomiting   Sulfonamide Derivatives     REACTION: HIVES    Outpatient Encounter Medications as of 12/13/2019  Medication Sig   carbidopa-levodopa (SINEMET) 25-100 MG tablet Take 2 tablets by mouth daily. Take daily at 2.30 pm   acetaminophen (TYLENOL) 500 MG tablet Take 1,000 mg by mouth 3 (three) times daily as needed.      calcium carbonate (TUMS EX) 750 MG chewable tablet Chew 1 tablet by mouth daily. Between the hours of 7am-10am.   carbidopa-levodopa (SINEMET CR) 50-200 MG tablet Take 1 tablet by mouth 2 (two) times daily. Give AM dose of 50/100 (1 tab) along with 6:30 dose of 25/100 1.5 tabs   carbidopa-levodopa (SINEMET IR) 25-100 MG tablet Take 1.5 tablets by mouth 3 (three) times daily. Morning: 6:30 am, MidDay 10:30 am,  Evening 6:30 pm .   Cholecalciferol (VITAMIN D3) 10 MCG (400 UNIT) CAPS Take 1 capsule by mouth daily. Between the hours of 7am-10am.   diltiazem (CARDIZEM CD) 120 MG 24 hr capsule Take 120 mg by mouth daily. Between the hours of 7am-10am. HOLD IF SBP < 100   ferrous sulfate 325 (65 FE) MG tablet Take 325 mg by mouth every Monday, Wednesday, and Friday. Give with food   hydrocortisone 2.5 % ointment Apply 1 application topically at bedtime as needed.    methocarbamol (ROBAXIN) 500 MG tablet Take 250 mg by mouth daily. As needed   mirabegron ER (MYRBETRIQ) 50 MG TB24 tablet Take 50 mg by mouth daily. Between the hours of 7am-10am.   omeprazole (PRILOSEC) 20 MG capsule Take 20 mg by mouth daily.   polyethylene glycol (MIRALAX / GLYCOLAX) 17 g packet Take 17 g by mouth daily.   rivaroxaban (XARELTO) 20 MG TABS tablet Take 20 mg by mouth every evening. Between the hours of 5pm-6pm.   Sodium Fluoride (PREVIDENT 5000 BOOSTER PLUS) 1.1 % PSTE Place 1 application onto teeth in the morning and at bedtime.   zinc oxide 20 % ointment Apply 1 application topically as needed for irritation. To buttocks after every incontinent episode and as needed for redness. May keep at bedside.   No facility-administered encounter medications on file as of 12/13/2019.    Review of Systems:  Review of Systems  Constitutional: Positive for activity change and appetite change.  HENT: Negative.   Respiratory: Negative.   Cardiovascular: Positive for leg swelling.  Gastrointestinal: Negative.    Genitourinary: Negative.   Musculoskeletal: Positive for arthralgias and gait problem.  Skin: Negative.   Neurological: Negative for dizziness.  Psychiatric/Behavioral: Negative.     Health Maintenance  Topic Date Due   Hepatitis C Screening  Never done   COLONOSCOPY  06/12/2019   TETANUS/TDAP  03/16/2024   INFLUENZA VACCINE  Completed   DEXA SCAN  Completed   COVID-19 Vaccine  Completed   PNA vac Low Risk Adult  Completed    Physical Exam: Vitals:   12/13/19 1518  BP: 106/66  Pulse: 62  Resp: (!) 22  Temp: 97.7 F (36.5 C)  Weight: 136 lb 11.2 oz (62 kg)   Body mass index is 20.79 kg/m. Physical Exam Vitals reviewed.  Constitutional:  Appearance: Normal appearance.  HENT:     Head: Normocephalic.     Nose: Nose normal.     Mouth/Throat:     Mouth: Mucous membranes are moist.     Pharynx: Oropharynx is clear.  Eyes:     Pupils: Pupils are equal, round, and reactive to light.  Cardiovascular:     Rate and Rhythm: Rhythm irregular.     Pulses: Normal pulses.  Pulmonary:     Effort: Pulmonary effort is normal.     Breath sounds: Normal breath sounds.  Abdominal:     General: Abdomen is flat. Bowel sounds are normal.     Palpations: Abdomen is soft.  Musculoskeletal:        General: Swelling present.     Cervical back: Neck supple.  Skin:    General: Skin is warm.  Neurological:     General: No focal deficit present.     Mental Status: She is alert.  Psychiatric:        Mood and Affect: Mood normal.     Labs reviewed: Basic Metabolic Panel: Recent Labs    05/17/19 0000 05/17/19 0000 07/19/19 0000 09/06/19 0000 10/11/19 0000  NA 140   < > 138 139 141  K 3.6   < > 4.1 4.0 4.1  CL 103   < > 104 103 107  CO2 31*   < > 29* 29* 27*  BUN 22*   < > _0 CREATININE 0.7   < > 0.7 0.8 0.8  CALCIUM 9.4  --  8.9  --  9.4   < > = values in this interval not displayed.   Liver Function Tests: Recent Labs    03/24/19 0000  04/01/19 0000 09/06/19 0000  AST 11* 12* 9*  ALT 5*  --  3*  ALKPHOS 50 48 62  ALBUMIN 4.0 3.8 3.8   No results for input(s): LIPASE, AMYLASE in the last 8760 hours. No results for input(s): AMMONIA in the last 8760 hours. CBC: Recent Labs    03/24/19 0000 04/01/19 0000 05/17/19 0000 09/06/19 0000 10/11/19 0000  WBC 8.4   < > 5.2 4.9 4.2  NEUTROABS 6,602  --   --  3,450  --   HGB 10.9*   < > 11.4* 11.7* 12.1  HCT 34*   < > 34* 36 37  PLT 190   < > 184 181 157   < > = values in this interval not displayed.   Lipid Panel: Recent Labs    03/24/19 0000  CHOL 185  HDL 79*  LDLCALC 94  TRIG 41   No results found for: HGBA1C  Procedures since last visit: No results found.  Assessment/Plan Parkinsonism, unspecified Parkinsonism type ([place]) D/w the daughter and Dr Rexene Alberts  Discussed d different options At this time will increase her Sinemet IR to 2 tablets at 230 Continue Sinemet CR twice daily as it is helping  patient in the morning Also continue Sinemet IR 1.5 tablet rest of the time Continue to monitor blood pressure closely Patient continues to be asymptomatic with dizziness Patient wants to think before going for second opinion from [facility] Orthostatic hypotension We will monitor blood pressure closely Persistent atrial fibrillation ([place]) Unfortunately sometimes does not get her Cardizem dose because of low blood pressures But her rate seems controlled Doing well on Xarelto Bilateral lower extremity edema Was taken off the diuretic due to low blood pressure Overactive bladder Doing well on Myrbetriq Anemia, unspecified  type Hemoglobin stable Discussed with the patient about repeat colonoscopy.  She wants to wait right now Continue iron  Labs/tests ordered:  * No order type specified * Next appt:  Visit date not found

## 2019-12-21 DIAGNOSIS — R278 Other lack of coordination: Secondary | ICD-10-CM | POA: Diagnosis not present

## 2019-12-21 DIAGNOSIS — Z7389 Other problems related to life management difficulty: Secondary | ICD-10-CM | POA: Diagnosis not present

## 2019-12-21 DIAGNOSIS — G20 Parkinson's disease: Secondary | ICD-10-CM | POA: Diagnosis not present

## 2019-12-22 DIAGNOSIS — G20 Parkinson's disease: Secondary | ICD-10-CM | POA: Diagnosis not present

## 2019-12-22 DIAGNOSIS — Z7389 Other problems related to life management difficulty: Secondary | ICD-10-CM | POA: Diagnosis not present

## 2019-12-22 DIAGNOSIS — R278 Other lack of coordination: Secondary | ICD-10-CM | POA: Diagnosis not present

## 2019-12-23 DIAGNOSIS — R278 Other lack of coordination: Secondary | ICD-10-CM | POA: Diagnosis not present

## 2019-12-23 DIAGNOSIS — G20 Parkinson's disease: Secondary | ICD-10-CM | POA: Diagnosis not present

## 2019-12-23 DIAGNOSIS — Z7389 Other problems related to life management difficulty: Secondary | ICD-10-CM | POA: Diagnosis not present

## 2019-12-26 DIAGNOSIS — R278 Other lack of coordination: Secondary | ICD-10-CM | POA: Diagnosis not present

## 2019-12-26 DIAGNOSIS — Z7389 Other problems related to life management difficulty: Secondary | ICD-10-CM | POA: Diagnosis not present

## 2019-12-26 DIAGNOSIS — G20 Parkinson's disease: Secondary | ICD-10-CM | POA: Diagnosis not present

## 2019-12-27 DIAGNOSIS — R278 Other lack of coordination: Secondary | ICD-10-CM | POA: Diagnosis not present

## 2019-12-27 DIAGNOSIS — Z7389 Other problems related to life management difficulty: Secondary | ICD-10-CM | POA: Diagnosis not present

## 2019-12-27 DIAGNOSIS — G20 Parkinson's disease: Secondary | ICD-10-CM | POA: Diagnosis not present

## 2019-12-28 DIAGNOSIS — G20 Parkinson's disease: Secondary | ICD-10-CM | POA: Diagnosis not present

## 2019-12-28 DIAGNOSIS — Z7389 Other problems related to life management difficulty: Secondary | ICD-10-CM | POA: Diagnosis not present

## 2019-12-28 DIAGNOSIS — R278 Other lack of coordination: Secondary | ICD-10-CM | POA: Diagnosis not present

## 2019-12-29 ENCOUNTER — Non-Acute Institutional Stay (SKILLED_NURSING_FACILITY): Payer: Medicare PPO | Admitting: Nurse Practitioner

## 2019-12-29 ENCOUNTER — Encounter: Payer: Self-pay | Admitting: Nurse Practitioner

## 2019-12-29 ENCOUNTER — Encounter: Payer: Self-pay | Admitting: Internal Medicine

## 2019-12-29 DIAGNOSIS — K219 Gastro-esophageal reflux disease without esophagitis: Secondary | ICD-10-CM

## 2019-12-29 DIAGNOSIS — N1831 Chronic kidney disease, stage 3a: Secondary | ICD-10-CM | POA: Diagnosis not present

## 2019-12-29 DIAGNOSIS — N3941 Urge incontinence: Secondary | ICD-10-CM | POA: Diagnosis not present

## 2019-12-29 DIAGNOSIS — I959 Hypotension, unspecified: Secondary | ICD-10-CM

## 2019-12-29 DIAGNOSIS — I4819 Other persistent atrial fibrillation: Secondary | ICD-10-CM | POA: Diagnosis not present

## 2019-12-29 DIAGNOSIS — I872 Venous insufficiency (chronic) (peripheral): Secondary | ICD-10-CM | POA: Diagnosis not present

## 2019-12-29 DIAGNOSIS — M25551 Pain in right hip: Secondary | ICD-10-CM

## 2019-12-29 DIAGNOSIS — G20 Parkinson's disease: Secondary | ICD-10-CM

## 2019-12-29 DIAGNOSIS — Z7389 Other problems related to life management difficulty: Secondary | ICD-10-CM | POA: Diagnosis not present

## 2019-12-29 DIAGNOSIS — K5900 Constipation, unspecified: Secondary | ICD-10-CM | POA: Diagnosis not present

## 2019-12-29 DIAGNOSIS — R278 Other lack of coordination: Secondary | ICD-10-CM | POA: Diagnosis not present

## 2019-12-29 DIAGNOSIS — D631 Anemia in chronic kidney disease: Secondary | ICD-10-CM

## 2019-12-29 NOTE — Assessment & Plan Note (Addendum)
Parkinson's may be better in pm symptoms,  f/u neurology, last 07/18/19,Sinemet IR was increased for better symptomatic control-extremetly tired in pm. Pending 2nd opinion from [facility]. ? Increase 10:30 am Sinemet

## 2019-12-29 NOTE — Assessment & Plan Note (Signed)
Constipation, takes MiraLax

## 2019-12-29 NOTE — Assessment & Plan Note (Signed)
GERD, stable, on Omeprazole 20mg qd.  

## 2019-12-29 NOTE — Assessment & Plan Note (Signed)
Anemia, stable, Hgb12.1 10/11/19,takes Fe

## 2019-12-29 NOTE — Assessment & Plan Note (Signed)
Urge incontinent of urine, takes Mirabegron.

## 2019-12-29 NOTE — Assessment & Plan Note (Signed)
R hip pain, on Tylenol 1000mg tidprn, Methocarbamol 250mg qd.   

## 2019-12-29 NOTE — Progress Notes (Addendum)
Location:   [place] Room Number: 13 Place of Service:  SNF (31) Provider:  Marda Stalker, Lennie Odor NP  [name], MD  Patient Care Team: [name], MD as PCP - General (Internal Medicine) [name], [name], MD as PCP - Cardiology (Cardiology) [name], [name], DO as Consulting Physician (Neurology) Brandon Wiechman X, NP as Nurse Practitioner (Internal Medicine)  Extended Emergency Contact Information Primary Emergency Contact: Demoni, Parmar Mobile Phone: [phone] Relation: Daughter Interpreter needed? No Secondary Emergency Contact: [facility] Address: [address]          [place], [place] [address] [name] of [place] Phone: [phone] Mobile Phone: [phone] Relation: Son  Code Status:  DNR Goals of care: Advanced Directive information Advanced Directives 12/06/2019  Does Patient Have a Medical Advance Directive? Yes  Type of Advance Directive [place]  Does patient want to make changes to medical advance directive? No - Patient declined  Copy of [place] in Chart? Yes - validated most recent copy scanned in chart (See row information)  Would patient like information on creating a medical advance directive? -  Pre-existing out of facility DNR order (yellow form or pink MOST form) -     Chief Complaint  Patient presents with  . Medical Management of Chronic Issues    HPI:  Pt is a 79 y.o. female seen today for medical management of chronic diseases.    The patient has chronic edema BLE. Off Metolazone 09/27/19.              Afib, heart rate is in control, on Xarelto 33m qd, Diltiazem 1277mqd.08/09/19 Cardiology f/u. R hip pain, on Tylenol 100033midprn, Methocarbamol 250m63m.  Parkinson's stable, f/u neurology, last 07/18/19,Sinemet IR was increased for better symptomatic control-extremetly tired in pm. [facility] persists, but little symptomatic.  Anemia, stable, Hgb12.1 10/11/19,takes Fe GERD, stable, on Omeprazole 20mg35m Urge incontinent of urine, takes Mirabegron. Constipation, takes MiraLax   Past Medical History:  Diagnosis Date  . Acute lower UTI 09/14/2018  . Anemia 10/15/2018   2016 colonoscopy 10/14/18 wbc 5.4, Hgb 10.1, plt 184 12/01/18 wbc 4.5, Hgb 11.1, plt 188, neutrophils 63,  Na 139, K 3.7, Bun 23, creat 0.69, eGFR 83 on Fe, Hgb 11.7 12/09/18   . Atrial fibrillation (HCC) [place]/2012   10/07/18 Na 143, K 4.0, Bun 20, creat 0.79, eGFR 72, wbc 7.1, Hgb 10.9, plt 172 10/28/18 Na 142, K 4.0, Bun 19, creat 0.77, eGFR 74 11/18/18 Na 144, K 4.1, Bun 21, creat 0.69, eGFR 83  . Bilateral lower extremity edema 07/19/2018   11/02/18 BMP 2 weeks.   . Cervical pain (neck) 06/20/2010  . Closed fracture of part of upper end of humerus 05/01/2015  . Colles' fracture of right radius 03/05/2015  . Constipation 07/19/2018  . DEGENERATIVE JOINT DISEASE, RIGHT HIP 02/16/2007  . Dupuytren's contracture   . Dysuria 09/20/2018   09/20/18 c/o got up several times last night to go urinate, burning on urination, lower abd /back discomfort, but urinary frequency, leakage are not new. UA C/S, Pyridium 100mg 20mx 2 days.  09/21/18 wbc 6.1, Hgb 11.8, plt 210, neutrophil 69.4, Na 142, K 4.1, Bun 19, creat 0.78, TP 6.4, albumin 3.9  . Hematuria 02/04/2014  . Hypotension 09/18/2018  . Long term (current) use of anticoagulants 05/29/2016  . Osteoarthritis of hip    right  . Osteoarthritis  of left knee   . Overactive bladder 10/06/2018  . Parkinson disease ([place])   . Paroxysmal A-fib ([place])   . POSTMENOPAUSAL SYNDROME 02/16/2007  . PREMATURE ATRIAL CONTRACTIONS 02/16/2007  . 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 S/P breast biopsy, left    two o'clock position - benign  . 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 Tremor, unspecified 10/19/2015  .  Unstable gait 02/16/2017  . Vaginal atrophy 10/19/2015  . Venous insufficiency   . Weakness 09/12/2018   Past Surgical History:  Procedure Laterality Date  . BREAST BIOPSY Left   . CATARACT EXTRACTION, BILATERAL      Allergies  Allergen Reactions  . Doxycycline Nausea And Vomiting  . Sulfonamide Derivatives     REACTION: HIVES    Allergies as of 12/29/2019      Reactions   Doxycycline Nausea And Vomiting   Sulfonamide Derivatives    REACTION: HIVES      Medication List       Accurate as of December 29, 2019 11:59 PM. If you have any questions, ask your nurse or doctor.        acetaminophen 500 MG tablet Commonly known as: TYLENOL Take 1,000 mg by mouth 3 (three) times daily as needed.   calcium carbonate 750 MG chewable tablet Commonly known as: TUMS EX Chew 1 tablet by mouth daily. Between the hours of 7am-10am.   carbidopa-levodopa 25-100 MG tablet Commonly known as: SINEMET IR Take 1.5 tablets by mouth 3 (three) times daily. Morning: 6:30 am, MidDay 10:30 am,  Evening 6:30 pm .   carbidopa-levodopa 50-200 MG tablet Commonly known as: SINEMET CR Take 1 tablet by mouth 2 (two) times daily. Give AM dose of 50/100 (1 tab) along with 6:30 dose of 25/100 1.5 tabs   carbidopa-levodopa 25-100 MG tablet Commonly known as: SINEMET IR Take 2 tablets by mouth daily. Take daily at 2.30 pm   diltiazem 120 MG 24 hr capsule Commonly known as: CARDIZEM CD Take 120 mg by mouth daily. Between the hours of 7am-10am. HOLD IF SBP < 100   ferrous sulfate 325 (65 FE) MG tablet Take 325 mg by mouth every Monday, Wednesday, and Friday. Give with food   hydrocortisone 2.5 % ointment Apply 1 application topically at bedtime as needed.   methocarbamol 500 MG tablet Commonly known as: ROBAXIN Take 250 mg by mouth daily. As needed   mirabegron ER 50 MG Tb24 tablet Commonly known as: MYRBETRIQ Take 50 mg by mouth daily. Between the hours of 7am-10am.   omeprazole 20 MG  capsule Commonly known as: PRILOSEC Take 20 mg by mouth daily.   polyethylene glycol 17 g packet Commonly known as: MIRALAX / GLYCOLAX Take 17 g by mouth daily.   PreviDent 5000 Booster Plus 1.1 % Pste Generic drug: Sodium Fluoride Place 1 application onto teeth in the morning and at bedtime.   rivaroxaban 20 MG Tabs tablet Commonly known as: XARELTO Take 20 mg by mouth every evening. Between the hours of 5pm-6pm.   Vitamin D3 10 MCG (400 UNIT) Caps Take 1 capsule by mouth daily. Between the hours of 7am-10am.   zinc oxide 20 % ointment Apply 1 application topically as needed for irritation. To buttocks after every incontinent episode and as needed for redness. May keep at bedside.       Review of Systems  Constitutional: Positive for fatigue. Negative for fever and unexpected weight change.       Fatigue in pm  HENT: Positive  for hearing loss. Negative for congestion and voice change.   Eyes: Negative for visual disturbance.  Respiratory: Negative for cough and shortness of breath.   Cardiovascular: Positive for leg swelling.  Gastrointestinal: Negative for abdominal pain and constipation.  Genitourinary: Positive for frequency and urgency. Negative for dysuria and hematuria.  Musculoskeletal: Positive for arthralgias, back pain and gait problem. Negative for myalgias.       Right hip pain is well controlled. Lower back discomfort positional.   Skin: Negative for color change.  Neurological: Positive for tremors. Negative for speech difficulty, light-headedness and headaches.       Moves slow, fine tremor in fingers, burning sensation  in the R+ L great toes when touched by sheets, comes/goes  Psychiatric/Behavioral: Negative for behavioral problems and sleep disturbance. The patient is not nervous/anxious.        Sleeps from 9pm to 4-5am usually.     Immunization History  Administered Date(s) Administered  . Influenza Split 10/20/2012  . Influenza Whole 10/21/2006  .  Influenza, High Dose Seasonal PF 10/17/2016, 11/02/2019  . Influenza-Unspecified 10/20/2013, 10/22/2017  . Moderna SARS-COVID-2 Vaccination 01/22/2019, 02/19/2019  . Pneumococcal Conjugate-13 01/31/2013  . Pneumococcal Polysaccharide-23 01/20/2005, 11/24/2011  . Td 01/21/2004  . Tdap 03/16/2014  . Zoster 04/17/2009   Pertinent  Health Maintenance Due  Topic Date Due  . COLONOSCOPY  06/12/2019  . INFLUENZA VACCINE  Completed  . DEXA SCAN  Completed  . PNA vac Low Risk Adult  Completed   Fall Risk  05/03/2018 11/03/2017 08/04/2017 03/06/2017 11/26/2016  Falls in the past year? 1 No No Yes Yes  Number falls in past yr: 0 - - 2 or more 2 or more  Injury with Fall? 0 - - No No  Risk Factor Category  - - - High Fall Risk High Fall Risk  Risk for fall due to : Other (Comment) - - - -  Follow up Falls evaluation completed;Education provided;Falls prevention discussed - - Falls evaluation completed Falls evaluation completed   Functional Status Survey:    Vitals:   12/29/19 1442  BP: (!) 80/50  Pulse: 68  Resp: 18  Temp: (!) 97 F (36.1 C)  SpO2: 94%  Weight: 137 lb 11.2 oz (62.5 kg)  Height: '5\' 8"'  (1.727 m)   Body mass index is 20.94 kg/m. Physical Exam Vitals and nursing note reviewed.  Constitutional:      Appearance: Normal appearance.  HENT:     Head: Normocephalic and atraumatic.     Mouth/Throat:     Mouth: Mucous membranes are moist.  Eyes:     Extraocular Movements: Extraocular movements intact.     Conjunctiva/sclera: Conjunctivae normal.     Pupils: Pupils are equal, round, and reactive to light.  Cardiovascular:     Rate and Rhythm: Normal rate. Rhythm irregular.     Heart sounds: No murmur heard.     Comments: DP pulses present L>R Pulmonary:     Effort: Pulmonary effort is normal.     Breath sounds: No rales.  Abdominal:     General: Bowel sounds are normal.     Palpations: Abdomen is soft.  Musculoskeletal:     Cervical back: Normal range of motion  and neck supple.     Right lower leg: Edema present.     Left lower leg: Edema present.     Comments: trace edema BLE. No decreased ROM of the left wrist.   Skin:    General: Skin is warm and dry.  Findings: Bruising present.     Comments: Back of the left hand and left wrist ecchymoses.   Neurological:     General: No focal deficit present.     Mental Status: She is alert and oriented to person, place, and time. Mental status is at baseline.     Coordination: Coordination abnormal.     Gait: Gait abnormal.     Comments: Moves slow, tremor in hands  Psychiatric:        Mood and Affect: Mood normal.        Behavior: Behavior normal.        Thought Content: Thought content normal.     Labs reviewed: Recent Labs    05/17/19 0000 07/19/19 0000 09/06/19 0000 10/11/19 0000  NA 140 138 139 141  K 3.6 4.1 4.0 4.1  CL 103 104 103 107  CO2 31* 29* 29* 27*  BUN 22* '20 16 20  ' CREATININE 0.7 0.7 0.8 0.8  CALCIUM 9.4 8.9  --  9.4   Recent Labs    03/24/19 0000 04/01/19 0000 09/06/19 0000  AST 11* 12* 9*  ALT 5*  --  3*  ALKPHOS 50 48 62  ALBUMIN 4.0 3.8 3.8   Recent Labs    03/24/19 0000 04/01/19 0000 05/17/19 0000 09/06/19 0000 10/11/19 0000  WBC 8.4   < > 5.2 4.9 4.2  NEUTROABS 6,602  --   --  3,450  --   HGB 10.9*   < > 11.4* 11.7* 12.1  HCT 34*   < > 34* 36 37  PLT 190   < > 184 181 157   < > = values in this interval not displayed.   Lab Results  Component Value Date   TSH 0.408 09/11/2018   No results found for: HGBA1C Lab Results  Component Value Date   CHOL 185 03/24/2019   HDL 79 (A) 03/24/2019   LDLCALC 94 03/24/2019   LDLDIRECT 104.2 01/25/2013   TRIG 41 03/24/2019   CHOLHDL 2 08/29/2015    Significant Diagnostic Results in last 30 days:  No results found.  Assessment/Plan Edema of left lower extremity due to peripheral venous insufficiency The patient has chronic edema BLE. Off Metolazone 09/27/19.   Atrial fibrillation (HCC) Afib,  heart rate is in control, on Xarelto 42m qd, Diltiazem 1252mqd.08/09/19 Cardiology f/u.  Right hip pain R hip pain, on Tylenol 10002midprn, Methocarbamol 250m66m.   Parkinsonism (HCC)[place] may be better in pm symptoms,  f/u neurology, last 07/18/19,Sinemet IR was increased for better symptomatic control-extremetly tired in pm. [facility]   Hypotension Orthostatic hypotension, persists, but little symptomatic.    Anemia Anemia, stable, Hgb12.1 10/11/19,takes Fe   GERD (gastroesophageal reflux disease) GERD, stable, on Omeprazole 20mg74m   Urge incontinence of urine Urge incontinent of urine, takes Mirabegron.   Constipation Constipation, takes MiraLCytogeneticistunication: plan of care reviewed with the patient and charge nurse.   Labs/tests ordered:  none  Time spend 35 minutes.

## 2019-12-29 NOTE — Assessment & Plan Note (Signed)
Afib, heart rate is in control, on Xarelto 20mg  qd, Diltiazem 120mg  qd.08/09/19 Cardiology f/u.

## 2019-12-29 NOTE — Assessment & Plan Note (Signed)
The patient has chronic edema BLE.Off Metolazone 09/27/19. 

## 2019-12-29 NOTE — Assessment & Plan Note (Signed)
Orthostatic hypotension, persists, but little symptomatic.

## 2019-12-30 ENCOUNTER — Encounter: Payer: Self-pay | Admitting: Nurse Practitioner

## 2019-12-30 DIAGNOSIS — R278 Other lack of coordination: Secondary | ICD-10-CM | POA: Diagnosis not present

## 2019-12-30 DIAGNOSIS — Z7389 Other problems related to life management difficulty: Secondary | ICD-10-CM | POA: Diagnosis not present

## 2019-12-30 DIAGNOSIS — G20 Parkinson's disease: Secondary | ICD-10-CM | POA: Diagnosis not present

## 2020-01-02 DIAGNOSIS — Z7389 Other problems related to life management difficulty: Secondary | ICD-10-CM | POA: Diagnosis not present

## 2020-01-02 DIAGNOSIS — G20 Parkinson's disease: Secondary | ICD-10-CM | POA: Diagnosis not present

## 2020-01-02 DIAGNOSIS — R278 Other lack of coordination: Secondary | ICD-10-CM | POA: Diagnosis not present

## 2020-01-03 ENCOUNTER — Non-Acute Institutional Stay (SKILLED_NURSING_FACILITY): Payer: Medicare PPO | Admitting: Internal Medicine

## 2020-01-03 ENCOUNTER — Encounter: Payer: Self-pay | Admitting: Internal Medicine

## 2020-01-03 DIAGNOSIS — K644 Residual hemorrhoidal skin tags: Secondary | ICD-10-CM

## 2020-01-03 DIAGNOSIS — I4819 Other persistent atrial fibrillation: Secondary | ICD-10-CM | POA: Diagnosis not present

## 2020-01-03 DIAGNOSIS — I872 Venous insufficiency (chronic) (peripheral): Secondary | ICD-10-CM

## 2020-01-03 DIAGNOSIS — N1831 Chronic kidney disease, stage 3a: Secondary | ICD-10-CM | POA: Diagnosis not present

## 2020-01-03 DIAGNOSIS — I959 Hypotension, unspecified: Secondary | ICD-10-CM

## 2020-01-03 DIAGNOSIS — D631 Anemia in chronic kidney disease: Secondary | ICD-10-CM

## 2020-01-03 DIAGNOSIS — N3941 Urge incontinence: Secondary | ICD-10-CM

## 2020-01-03 DIAGNOSIS — Z7389 Other problems related to life management difficulty: Secondary | ICD-10-CM | POA: Diagnosis not present

## 2020-01-03 DIAGNOSIS — R278 Other lack of coordination: Secondary | ICD-10-CM | POA: Diagnosis not present

## 2020-01-03 DIAGNOSIS — K219 Gastro-esophageal reflux disease without esophagitis: Secondary | ICD-10-CM

## 2020-01-03 DIAGNOSIS — G20 Parkinson's disease: Secondary | ICD-10-CM

## 2020-01-03 NOTE — Progress Notes (Signed)
Location: [facility] Room Number: 13 Place of Service:  SNF [phone])  Provider: Veleta Miners MD  Code Status: DNR Goals of Care:  Advanced Directives 12/06/2019  Does Patient Have a Medical Advance Directive? Yes  Type of Advance Directive [place]  Does patient want to make changes to medical advance directive? No - Patient declined  Copy of [place] in Chart? Yes - validated most recent copy scanned in chart (See row information)  Would patient like information on creating a medical advance directive? -  Pre-existing out of facility DNR order (yellow form or pink MOST form) -     Chief Complaint  Patient presents with  . Acute Visit    Follow up of Parkinson's     HPI: Patient is a 79 y.o. female seen today for an acute visit for Continues to have Cramps due to her Parkinson   Patient has a history of Parkinson disease diagnosed 2 years ago on Sinemet and follows withNeurology, paroxysmal A. fib on Xarelto,  hyperlipidemia, H/o Ovarian Cyst, Lower extremity edema,2 D echo with N EF Biatrial enlargement Urinary Incontinence HadChronic Right Hip Pain. 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  Recent change of her Sinemet to  CR BID and IR to 1.5 tabs  3 times a day and 2 tabs once a day from 2 tabs QID  Per Patient she continues to have issues with her Parkinson symptoms  in the afternoon starting at around 1 pm. Does not get any relief till 10 pm at night. Even having hard time to feed herself due to tremors. Cannot Walk much with therapy due to weakness  BP runs low in the morning but better as day goes by Does have some dizziness in the morning but it gets better later  Also having Hemorrhoids Bleed recently   Past Medical History:  Diagnosis Date  . Acute lower UTI 09/14/2018  . Anemia 10/15/2018   2016 colonoscopy 10/14/18 wbc 5.4, Hgb 10.1, plt  184 12/01/18 wbc 4.5, Hgb 11.1, plt 188, neutrophils 63,  Na 139, K 3.7, Bun 23, creat 0.69, eGFR 83 on Fe, Hgb 11.7 12/09/18   . Atrial fibrillation ([place]) 05/21/2010   10/07/18 Na 143, K 4.0, Bun 20, creat 0.79, eGFR 72, wbc 7.1, Hgb 10.9, plt 172 10/28/18 Na 142, K 4.0, Bun 19, creat 0.77, eGFR 74 11/18/18 Na 144, K 4.1, Bun 21, creat 0.69, eGFR 83  . Bilateral lower extremity edema 07/19/2018   11/02/18 BMP 2 weeks.   . Cervical pain (neck) 06/20/2010  . Closed fracture of part of upper end of humerus 05/01/2015  . Colles' fracture of right radius 03/05/2015  . Constipation 07/19/2018  . DEGENERATIVE JOINT DISEASE, RIGHT HIP 02/16/2007  . Dupuytren's contracture   . Dysuria 09/20/2018   09/20/18 c/o got up several times last night to go urinate, burning on urination, lower abd /back discomfort, but urinary frequency, leakage are not new. UA C/S, Pyridium 138m tid x 2 days.  09/21/18 wbc 6.1, Hgb 11.8, plt 210, neutrophil 69.4, Na 142, K 4.1, Bun 19, creat 0.78, TP 6.4, albumin 3.9  . Hematuria 02/04/2014  . Hypotension 09/18/2018  . Long term (current) use of anticoagulants 05/29/2016  . Osteoarthritis of hip    right  . Osteoarthritis of left knee   . Overactive bladder 10/06/2018  . Parkinson disease ([place]   . Paroxysmal A-fib ([place]   .  POSTMENOPAUSAL SYNDROME 02/16/2007  . PREMATURE ATRIAL CONTRACTIONS 02/16/2007  . 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 S/P breast biopsy, left    two o'clock position - benign  . 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 Tremor, unspecified 10/19/2015  . Unstable gait 02/16/2017  . Vaginal atrophy 10/19/2015  . Venous insufficiency   . Weakness 09/12/2018    Past Surgical History:  Procedure Laterality Date  . BREAST BIOPSY Left   . CATARACT EXTRACTION, BILATERAL      Allergies  Allergen Reactions  . Doxycycline Nausea And Vomiting  . Sulfonamide Derivatives     REACTION: HIVES    Outpatient Encounter Medications as of 01/03/2020   Medication Sig  . acetaminophen (TYLENOL) 500 MG tablet Take 1,000 mg by mouth 3 (three) times daily as needed.   . calcium carbonate (TUMS EX) 750 MG chewable tablet Chew 1 tablet by mouth daily. Between the hours of 7am-10am.  . carbidopa-levodopa (SINEMET IR) 25-100 MG tablet Take 2 tablets by mouth daily. Take daily at 2.30 pm  . Cholecalciferol (VITAMIN D3) 10 MCG (400 UNIT) CAPS Take 1 capsule by mouth daily. Between the hours of 7am-10am.  . diltiazem (CARDIZEM CD) 120 MG 24 hr capsule Take 120 mg by mouth daily. Between the hours of 7am-10am. HOLD IF SBP < 100  . ferrous sulfate 325 (65 FE) MG tablet Take 325 mg by mouth every Monday, Wednesday, and Friday. Give with food  . hydrocortisone 2.5 % ointment Apply 1 application topically at bedtime as needed.   . methocarbamol (ROBAXIN) 500 MG tablet Take 250 mg by mouth daily. As needed  . mirabegron ER (MYRBETRIQ) 50 MG TB24 tablet Take 50 mg by mouth daily. Between the hours of 7am-10am.  . omeprazole (PRILOSEC) 20 MG capsule Take 20 mg by mouth daily.  . polyethylene glycol (MIRALAX / GLYCOLAX) 17 g packet Take 17 g by mouth daily.  . rivaroxaban (XARELTO) 20 MG TABS tablet Take 20 mg by mouth every evening. Between the hours of 5pm-6pm.  . Sodium Fluoride (PREVIDENT 5000 BOOSTER PLUS) 1.1 % PSTE Place 1 application onto teeth in the morning and at bedtime.  Marland Kitchen zinc oxide 20 % ointment Apply 1 application topically as needed for irritation. To buttocks after every incontinent episode and as needed for redness. May keep at bedside.  . [DISCONTINUED] carbidopa-levodopa (SINEMET CR) 50-200 MG tablet Take 1 tablet by mouth 2 (two) times daily. Give AM dose of 50/100 (1 tab) along with 6:30 dose of 25/100 1.5 tabs  . [DISCONTINUED] carbidopa-levodopa (SINEMET IR) 25-100 MG tablet Take 1.5 tablets by mouth 3 (three) times daily. Morning: 6:30 am, MidDay 10:30 am,  Evening 6:30 pm .   No facility-administered encounter medications on file as of  01/03/2020.    Review of Systems:  Review of Systems  Constitutional: Positive for activity change.  HENT: Negative.   Respiratory: Negative.   Cardiovascular: Positive for leg swelling.  Gastrointestinal: Positive for blood in stool and constipation.  Genitourinary: Positive for frequency.  Musculoskeletal: Positive for gait problem and myalgias.  Neurological: Positive for dizziness and weakness.  Psychiatric/Behavioral: Negative.     Health Maintenance  Topic Date Due  . Hepatitis C Screening  Never done  . COLONOSCOPY  06/12/2019  . COVID-19 Vaccine (3 - Booster for Moderna series) 08/19/2019  . TETANUS/TDAP  03/16/2024  . INFLUENZA VACCINE  Completed  . DEXA SCAN  Completed  . PNA vac Low Risk Adult  Completed    Physical  Exam: Vitals:   01/03/20 1524  BP: (!) 90/56  Pulse: 68  Resp: 18  Temp: (!) 96.2 F (35.7 C)  SpO2: 95%  Weight: 140 lb 12.8 oz (63.9 kg)  Height: '5\' 8"'  (1.727 m)   Body mass index is 21.41 kg/m. Physical Exam  Constitutional: Oriented to person, place, and time. Well-developed and well-nourished.  HENT:  Head: Normocephalic.  Mouth/Throat: Oropharynx is clear and moist.  Eyes: Pupils are equal, round, and reactive to light.  Neck: Neck supple.  Cardiovascular: Normal rate and normal heart sounds. Irregular  No murmur heard. Pulmonary/Chest: Effort normal and breath sounds normal. No respiratory distress. No wheezes. She has no rales.  Abdominal: Soft. Bowel sounds are normal. No distension. There is no tenderness. There is no rebound.  Musculoskeletal: Moderate edema bilateral Lymphadenopathy: none Neurological: Alert and oriented to person, place, and time.  Skin: Skin is warm and dry.  Psychiatric: Normal mood and affect. Behavior is normal. Thought content normal.    Labs reviewed: Basic Metabolic Panel: Recent Labs    05/17/19 0000 07/19/19 0000 09/06/19 0000 10/11/19 0000  NA 140 138 139 141  K 3.6 4.1 4.0 4.1  CL 103  104 103 107  CO2 31* 29* 29* 27*  BUN 22* '20 16 20  ' CREATININE 0.7 0.7 0.8 0.8  CALCIUM 9.4 8.9  --  9.4   Liver Function Tests: Recent Labs    03/24/19 0000 04/01/19 0000 09/06/19 0000  AST 11* 12* 9*  ALT 5*  --  3*  ALKPHOS 50 48 62  ALBUMIN 4.0 3.8 3.8   No results for input(s): LIPASE, AMYLASE in the last 8760 hours. No results for input(s): AMMONIA in the last 8760 hours. CBC: Recent Labs    03/24/19 0000 04/01/19 0000 05/17/19 0000 09/06/19 0000 10/11/19 0000  WBC 8.4   < > 5.2 4.9 4.2  NEUTROABS 6,602  --   --  3,450  --   HGB 10.9*   < > 11.4* 11.7* 12.1  HCT 34*   < > 34* 36 37  PLT 190   < > 184 181 157   < > = values in this interval not displayed.   Lipid Panel: Recent Labs    03/24/19 0000  CHOL 185  HDL 79*  LDLCALC 94  TRIG 41   No results found for: HGBA1C  Procedures since last visit: No results found.  Assessment/Plan  Parkinsonism, unspecified Parkinsonism type ([place]) Will change her Sinemet ER to 1 tab TID And continue IR to 1.5 tabs 4 times  A day Check BP to follow for Hypotension External hemorrhoids Anusol HC BID Persistent atrial fibrillation (HCC) Will discontinue Cardizem as it has been on hold due to low BP and Patient has done fine On Eliquis Edema of lower extremity  Only ted hoses No Diuretics  Hypotension, unspecified hypotension type Check BP Twice a day with recent change in Sinemet Only symptomatic in the morning  Anemia due to stage 3a chronic kidney disease (HCC) Hgb stable On Iron   Gastroesophageal reflux disease, unspecified whether esophagitis present On Prilosec Urge incontinence of urine On Myrbetriq   Labs/tests ordered:  * No order type specified * Next appt:  Visit date not found Total time spent in this patient care encounter was 45_  minutes; greater than 50% of the visit spent counseling patient and staff, reviewing records , Labs and coordinating care for problems addressed at this  encounter.

## 2020-01-04 DIAGNOSIS — R278 Other lack of coordination: Secondary | ICD-10-CM | POA: Diagnosis not present

## 2020-01-04 DIAGNOSIS — Z7389 Other problems related to life management difficulty: Secondary | ICD-10-CM | POA: Diagnosis not present

## 2020-01-04 DIAGNOSIS — G20 Parkinson's disease: Secondary | ICD-10-CM | POA: Diagnosis not present

## 2020-01-05 DIAGNOSIS — G20 Parkinson's disease: Secondary | ICD-10-CM | POA: Diagnosis not present

## 2020-01-05 DIAGNOSIS — Z7389 Other problems related to life management difficulty: Secondary | ICD-10-CM | POA: Diagnosis not present

## 2020-01-05 DIAGNOSIS — R278 Other lack of coordination: Secondary | ICD-10-CM | POA: Diagnosis not present

## 2020-01-06 DIAGNOSIS — G20 Parkinson's disease: Secondary | ICD-10-CM | POA: Diagnosis not present

## 2020-01-06 DIAGNOSIS — Z7389 Other problems related to life management difficulty: Secondary | ICD-10-CM | POA: Diagnosis not present

## 2020-01-06 DIAGNOSIS — R278 Other lack of coordination: Secondary | ICD-10-CM | POA: Diagnosis not present

## 2020-01-09 DIAGNOSIS — G20 Parkinson's disease: Secondary | ICD-10-CM | POA: Diagnosis not present

## 2020-01-09 DIAGNOSIS — R278 Other lack of coordination: Secondary | ICD-10-CM | POA: Diagnosis not present

## 2020-01-09 DIAGNOSIS — Z7389 Other problems related to life management difficulty: Secondary | ICD-10-CM | POA: Diagnosis not present

## 2020-01-10 DIAGNOSIS — R278 Other lack of coordination: Secondary | ICD-10-CM | POA: Diagnosis not present

## 2020-01-10 DIAGNOSIS — Z7389 Other problems related to life management difficulty: Secondary | ICD-10-CM | POA: Diagnosis not present

## 2020-01-10 DIAGNOSIS — G20 Parkinson's disease: Secondary | ICD-10-CM | POA: Diagnosis not present

## 2020-01-11 DIAGNOSIS — G20 Parkinson's disease: Secondary | ICD-10-CM | POA: Diagnosis not present

## 2020-01-11 DIAGNOSIS — R278 Other lack of coordination: Secondary | ICD-10-CM | POA: Diagnosis not present

## 2020-01-11 DIAGNOSIS — Z7389 Other problems related to life management difficulty: Secondary | ICD-10-CM | POA: Diagnosis not present

## 2020-01-12 DIAGNOSIS — R278 Other lack of coordination: Secondary | ICD-10-CM | POA: Diagnosis not present

## 2020-01-12 DIAGNOSIS — Z7389 Other problems related to life management difficulty: Secondary | ICD-10-CM | POA: Diagnosis not present

## 2020-01-12 DIAGNOSIS — G20 Parkinson's disease: Secondary | ICD-10-CM | POA: Diagnosis not present

## 2020-01-13 DIAGNOSIS — R278 Other lack of coordination: Secondary | ICD-10-CM | POA: Diagnosis not present

## 2020-01-13 DIAGNOSIS — Z7389 Other problems related to life management difficulty: Secondary | ICD-10-CM | POA: Diagnosis not present

## 2020-01-13 DIAGNOSIS — G20 Parkinson's disease: Secondary | ICD-10-CM | POA: Diagnosis not present

## 2020-01-16 DIAGNOSIS — Z7389 Other problems related to life management difficulty: Secondary | ICD-10-CM | POA: Diagnosis not present

## 2020-01-16 DIAGNOSIS — R278 Other lack of coordination: Secondary | ICD-10-CM | POA: Diagnosis not present

## 2020-01-16 DIAGNOSIS — G20 Parkinson's disease: Secondary | ICD-10-CM | POA: Diagnosis not present

## 2020-01-17 DIAGNOSIS — Z7389 Other problems related to life management difficulty: Secondary | ICD-10-CM | POA: Diagnosis not present

## 2020-01-17 DIAGNOSIS — G20 Parkinson's disease: Secondary | ICD-10-CM | POA: Diagnosis not present

## 2020-01-17 DIAGNOSIS — R278 Other lack of coordination: Secondary | ICD-10-CM | POA: Diagnosis not present

## 2020-01-18 DIAGNOSIS — G20 Parkinson's disease: Secondary | ICD-10-CM | POA: Diagnosis not present

## 2020-01-18 DIAGNOSIS — Z7389 Other problems related to life management difficulty: Secondary | ICD-10-CM | POA: Diagnosis not present

## 2020-01-18 DIAGNOSIS — R278 Other lack of coordination: Secondary | ICD-10-CM | POA: Diagnosis not present

## 2020-01-19 DIAGNOSIS — R278 Other lack of coordination: Secondary | ICD-10-CM | POA: Diagnosis not present

## 2020-01-19 DIAGNOSIS — G20 Parkinson's disease: Secondary | ICD-10-CM | POA: Diagnosis not present

## 2020-01-19 DIAGNOSIS — Z7389 Other problems related to life management difficulty: Secondary | ICD-10-CM | POA: Diagnosis not present

## 2020-01-20 DIAGNOSIS — G20 Parkinson's disease: Secondary | ICD-10-CM | POA: Diagnosis not present

## 2020-01-20 DIAGNOSIS — R278 Other lack of coordination: Secondary | ICD-10-CM | POA: Diagnosis not present

## 2020-01-20 DIAGNOSIS — Z7389 Other problems related to life management difficulty: Secondary | ICD-10-CM | POA: Diagnosis not present

## 2020-01-31 ENCOUNTER — Encounter: Payer: Self-pay | Admitting: Internal Medicine

## 2020-01-31 ENCOUNTER — Non-Acute Institutional Stay (SKILLED_NURSING_FACILITY): Payer: Medicare PPO | Admitting: Internal Medicine

## 2020-01-31 DIAGNOSIS — K219 Gastro-esophageal reflux disease without esophagitis: Secondary | ICD-10-CM

## 2020-01-31 DIAGNOSIS — D631 Anemia in chronic kidney disease: Secondary | ICD-10-CM | POA: Diagnosis not present

## 2020-01-31 DIAGNOSIS — I872 Venous insufficiency (chronic) (peripheral): Secondary | ICD-10-CM | POA: Diagnosis not present

## 2020-01-31 DIAGNOSIS — I959 Hypotension, unspecified: Secondary | ICD-10-CM | POA: Diagnosis not present

## 2020-01-31 DIAGNOSIS — I4819 Other persistent atrial fibrillation: Secondary | ICD-10-CM

## 2020-01-31 DIAGNOSIS — G20 Parkinson's disease: Secondary | ICD-10-CM | POA: Diagnosis not present

## 2020-01-31 DIAGNOSIS — N1831 Chronic kidney disease, stage 3a: Secondary | ICD-10-CM

## 2020-01-31 NOTE — Progress Notes (Signed)
Location:   [facility] Room Number: [place] of Service:  SNF [phone]) Provider:  Veleta Miners MD  [name], MD  Patient Care Team: [name], MD as PCP - General (Internal Medicine) [name], [name], MD as PCP - Cardiology (Cardiology) [name], [name], DO as Consulting Physician (Neurology) Mast, Man X, NP as Nurse Practitioner (Internal Medicine)  Extended Emergency Contact Information Primary Emergency Contact: [place], [place] Mobile Phone: [phone] Relation: Daughter Interpreter needed? No Secondary Emergency Contact: [facility] Address: [address]          [place], [place] [address] [name] of [place] Phone: [phone] Mobile Phone: [phone] Relation: Son  Code Status:  DNR Goals of care: Advanced Directive information Advanced Directives 12/06/2019  Does Patient Have a Medical Advance Directive? Yes  Type of Advance Directive [place]  Does patient want to make changes to medical advance directive? No - Patient declined  Copy of [place] in Chart? Yes - validated most recent copy scanned in chart (See row information)  Would patient like information on creating a medical advance directive? -  Pre-existing out of facility DNR order (yellow form or pink MOST form) -     Chief Complaint  Patient presents with  . Acute Visit    Follow up    HPI:  Pt is a 80 y.o. female seen today for an acute visit for Follow up of her changes in Sinemet   Patient has a history of Parkinson disease diagnosed 2 years ago on Sinemet and follows withNeurology, paroxysmal A. fib on Xarelto,  hyperlipidemia, H/o Ovarian Cyst, Lower extremity edema,2 D echo with N EF Biatrial enlargement Urinary Incontinence HadChronic Right Hip Pain. 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  Was doing well when her dose of Sinemet was changed  few weeks ago But she is  having Cramps and slowing down again especially around lunch time till her afternoon dose kicks in at 3.30-4 pm She says it has been hard for her to feed herself and do her therapy. Feels weak and stiff and Cramps in her neck  Her BP has been stable since her Cardizem was discontinued and off her diuretics SBP varies from 90-110 No Dizziness   Past Medical History:  Diagnosis Date  . Acute lower UTI 09/14/2018  . Anemia 10/15/2018   2016 colonoscopy 10/14/18 wbc 5.4, Hgb 10.1, plt 184 12/01/18 wbc 4.5, Hgb 11.1, plt 188, neutrophils 63,  Na 139, K 3.7, Bun 23, creat 0.69, eGFR 83 on Fe, Hgb 11.7 12/09/18   . Atrial fibrillation ([place]) 05/21/2010   10/07/18 Na 143, K 4.0, Bun 20, creat 0.79, eGFR 72, wbc 7.1, Hgb 10.9, plt 172 10/28/18 Na 142, K 4.0, Bun 19, creat 0.77, eGFR 74 11/18/18 Na 144, K 4.1, Bun 21, creat 0.69, eGFR 83  . Bilateral lower extremity edema 07/19/2018   11/02/18 BMP 2 weeks.   . Cervical pain (neck) 06/20/2010  . Closed fracture of part of upper end of humerus 05/01/2015  . Colles' fracture of right radius 03/05/2015  . Constipation 07/19/2018  . DEGENERATIVE JOINT DISEASE, RIGHT HIP 02/16/2007  . Dupuytren's contracture   . Dysuria 09/20/2018   09/20/18 c/o got up several times last night to go urinate, burning on urination, lower abd /back discomfort, but urinary frequency, leakage are not new. UA C/S, Pyridium 112m tid x 2 days.  09/21/18 wbc 6.1,  Hgb 11.8, plt 210, neutrophil 69.4, Na 142, K 4.1, Bun 19, creat 0.78, TP 6.4, albumin 3.9  . Hematuria 02/04/2014  . Hypotension 09/18/2018  . Long term (current) use of anticoagulants 05/29/2016  . Osteoarthritis of hip    right  . Osteoarthritis of left knee   . Overactive bladder 10/06/2018  . Parkinson disease ([place])   . Paroxysmal A-fib ([place])   . POSTMENOPAUSAL SYNDROME 02/16/2007  . PREMATURE ATRIAL CONTRACTIONS 02/16/2007  . 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 S/P breast biopsy, left     two o'clock position - benign  . 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 Tremor, unspecified 10/19/2015  . Unstable gait 02/16/2017  . Vaginal atrophy 10/19/2015  . Venous insufficiency   . Weakness 09/12/2018   Past Surgical History:  Procedure Laterality Date  . BREAST BIOPSY Left   . CATARACT EXTRACTION, BILATERAL      Allergies  Allergen Reactions  . Doxycycline Nausea And Vomiting  . Sulfonamide Derivatives     REACTION: HIVES    Allergies as of 01/31/2020      Reactions   Doxycycline Nausea And Vomiting   Sulfonamide Derivatives    REACTION: HIVES      Medication List       Accurate as of January 31, 2020 10:36 AM. If you have any questions, ask your nurse or doctor.        acetaminophen 500 MG tablet Commonly known as: TYLENOL Take 1,000 mg by mouth 3 (three) times daily as needed.   calcium carbonate 750 MG chewable tablet Commonly known as: TUMS EX Chew 1 tablet by mouth daily. Between the hours of 7am-10am.   carbidopa-levodopa 25-100 MG tablet Commonly known as: SINEMET IR Take 1.5 tablets by mouth 4 (four) times daily.   carbidopa-levodopa 50-200 MG tablet Commonly known as: SINEMET CR Take 1 tablet by mouth 3 (three) times daily.   ferrous sulfate 325 (65 FE) MG tablet Take 325 mg by mouth every Monday, Wednesday, and Friday. Give with food   hydrocortisone 2.5 % ointment Apply 1 application topically at bedtime as needed.   methocarbamol 500 MG tablet Commonly known as: ROBAXIN Take 250 mg by mouth daily. As needed   mirabegron ER 50 MG Tb24 tablet Commonly known as: MYRBETRIQ Take 50 mg by mouth daily. Between the hours of 7am-10am.   omeprazole 20 MG capsule Commonly known as: PRILOSEC Take 20 mg by mouth daily.   polyethylene glycol 17 g packet Commonly known as: MIRALAX / GLYCOLAX Take 17 g by mouth daily.   PreviDent 5000 Booster Plus 1.1 % Pste Generic drug: Sodium Fluoride Place 1 application onto teeth in the morning and  at bedtime.   rivaroxaban 20 MG Tabs tablet Commonly known as: XARELTO Take 20 mg by mouth every evening. Between the hours of 5pm-6pm.   Vitamin D3 10 MCG (400 UNIT) Caps Take 1 capsule by mouth daily. Between the hours of 7am-10am.   zinc oxide 20 % ointment Apply 1 application topically as needed for irritation. To buttocks after every incontinent episode and as needed for redness. May keep at bedside.       Review of Systems  Constitutional: Positive for activity change.  HENT: Negative.   Respiratory: Negative.   Cardiovascular: Positive for leg swelling.  Gastrointestinal: Positive for constipation.  Genitourinary: Positive for difficulty urinating.  Musculoskeletal: Positive for gait problem, neck pain and neck stiffness.  Skin: Negative.   Neurological: Positive for weakness.  Psychiatric/Behavioral: Negative.  Immunization History  Administered Date(s) Administered  . Influenza Split 10/20/2012  . Influenza Whole 10/21/2006  . Influenza, High Dose Seasonal PF 10/17/2016, 11/02/2019  . Influenza-Unspecified 10/20/2013, 10/22/2017  . Moderna Sars-Covid-2 Vaccination 01/22/2019, 02/19/2019  . Pneumococcal Conjugate-13 01/31/2013  . Pneumococcal Polysaccharide-23 01/20/2005, 11/24/2011  . Td 01/21/2004  . Tdap 03/16/2014  . Zoster 04/17/2009   Pertinent  Health Maintenance Due  Topic Date Due  . COLONOSCOPY (Pts 45-20yr Insurance coverage will need to be confirmed)  06/12/2019  . INFLUENZA VACCINE  Completed  . DEXA SCAN  Completed  . PNA vac Low Risk Adult  Completed   Fall Risk  05/03/2018 11/03/2017 08/04/2017 03/06/2017 11/26/2016  Falls in the past year? 1 No No Yes Yes  Number falls in past yr: 0 - - 2 or more 2 or more  Injury with Fall? 0 - - No No  Risk Factor Category  - - - High Fall Risk High Fall Risk  Risk for fall due to : Other (Comment) - - - -  Follow up Falls evaluation completed;Education provided;Falls prevention discussed - - Falls  evaluation completed Falls evaluation completed   Functional Status Survey:    Vitals:   01/31/20 1017  BP: 96/62  Pulse: (!) 58  Resp: 15  Temp: (!) 97.1 F (36.2 C)  SpO2: 100%  Weight: 139 lb 8 oz (63.3 kg)  Height: '5\' 8"'  (1.727 m)   Body mass index is 21.21 kg/m. Physical Exam Vitals reviewed.  Constitutional:      Appearance: Normal appearance.  HENT:     Head: Normocephalic.     Nose: Nose normal.     Mouth/Throat:     Mouth: Mucous membranes are moist.     Pharynx: Oropharynx is clear.  Eyes:     Pupils: Pupils are equal, round, and reactive to light.  Cardiovascular:     Rate and Rhythm: Normal rate. Rhythm irregular.     Pulses: Normal pulses.     Heart sounds: No murmur heard.   Pulmonary:     Effort: Pulmonary effort is normal.     Breath sounds: No wheezing or rales.  Abdominal:     General: Abdomen is flat.     Palpations: Abdomen is soft.  Musculoskeletal:        General: Swelling present.     Cervical back: Neck supple.  Skin:    General: Skin is warm.  Neurological:     General: No focal deficit present.     Mental Status: She is alert.  Psychiatric:        Mood and Affect: Mood normal.        Thought Content: Thought content normal.     Labs reviewed: Recent Labs    05/17/19 0000 07/19/19 0000 09/06/19 0000 10/11/19 0000  NA 140 138 139 141  K 3.6 4.1 4.0 4.1  CL 103 104 103 107  CO2 31* 29* 29* 27*  BUN 22* '20 16 20  ' CREATININE 0.7 0.7 0.8 0.8  CALCIUM 9.4 8.9  --  9.4   Recent Labs    03/24/19 0000 04/01/19 0000 09/06/19 0000  AST 11* 12* 9*  ALT 5*  --  3*  ALKPHOS 50 48 62  ALBUMIN 4.0 3.8 3.8   Recent Labs    03/24/19 0000 04/01/19 0000 05/17/19 0000 09/06/19 0000 10/11/19 0000  WBC 8.4   < > 5.2 4.9 4.2  NEUTROABS 6,602  --   --  3,450  --  HGB 10.9*   < > 11.4* 11.7* 12.1  HCT 34*   < > 34* 36 37  PLT 190   < > 184 181 157   < > = values in this interval not displayed.   Lab Results  Component  Value Date   TSH 0.408 09/11/2018   No results found for: HGBA1C Lab Results  Component Value Date   CHOL 185 03/24/2019   HDL 79 (A) 03/24/2019   LDLCALC 94 03/24/2019   LDLDIRECT 104.2 01/25/2013   TRIG 41 03/24/2019   CHOLHDL 2 08/29/2015    Significant Diagnostic Results in last 30 days:  No results found.  Assessment/Plan Parkinsonism, unspecified Parkinsonism type ([place]) Change her Sinemet at 2 pm to 2 tabs Keep other dosing same for now If BP stable will change her 10 am dose also Patient will talk to her daughter about getting going to [facility] for another opinion as per Neurology Also consider follow up with Dr Rexene Alberts earlier  Persistent atrial fibrillation ([place]) On Xarelto  Off Cardizem due to Low BP  Anemia due to stage 3a chronic kidney disease (HCC) Stable Hypotension, unspecified hypotension type Off Diuretics and Cardizem Edema of lower extremity due to peripheral venous insufficiency Supportive care with Ted hoses No Diuretics due to Low BP  Gastroesophageal reflux disease, unspecified whether esophagitis present On Prilosec Urinary Incontinence On Myrebetriq   Family/ staff Communication:   Labs/tests ordered:

## 2020-02-01 ENCOUNTER — Non-Acute Institutional Stay (SKILLED_NURSING_FACILITY): Payer: Medicare PPO | Admitting: Nurse Practitioner

## 2020-02-01 ENCOUNTER — Encounter: Payer: Self-pay | Admitting: Nurse Practitioner

## 2020-02-01 DIAGNOSIS — I959 Hypotension, unspecified: Secondary | ICD-10-CM | POA: Diagnosis not present

## 2020-02-01 DIAGNOSIS — I4819 Other persistent atrial fibrillation: Secondary | ICD-10-CM | POA: Diagnosis not present

## 2020-02-01 DIAGNOSIS — K5900 Constipation, unspecified: Secondary | ICD-10-CM | POA: Diagnosis not present

## 2020-02-01 DIAGNOSIS — G20 Parkinson's disease: Secondary | ICD-10-CM

## 2020-02-01 DIAGNOSIS — N3941 Urge incontinence: Secondary | ICD-10-CM

## 2020-02-01 DIAGNOSIS — N1831 Chronic kidney disease, stage 3a: Secondary | ICD-10-CM | POA: Diagnosis not present

## 2020-02-01 DIAGNOSIS — I872 Venous insufficiency (chronic) (peripheral): Secondary | ICD-10-CM | POA: Diagnosis not present

## 2020-02-01 DIAGNOSIS — M159 Polyosteoarthritis, unspecified: Secondary | ICD-10-CM

## 2020-02-01 DIAGNOSIS — K219 Gastro-esophageal reflux disease without esophagitis: Secondary | ICD-10-CM | POA: Diagnosis not present

## 2020-02-01 DIAGNOSIS — D631 Anemia in chronic kidney disease: Secondary | ICD-10-CM

## 2020-02-01 NOTE — Assessment & Plan Note (Signed)
Urge incontinent of urine, takes Mirabegron.

## 2020-02-01 NOTE — Assessment & Plan Note (Signed)
Constipation, takes MiraLax.  

## 2020-02-01 NOTE — Assessment & Plan Note (Signed)
Parkinson's stable, f/u neurology, Dr. Lyndel Safe increased Sinemet to II at 2pm, the same of the rest. Plan to increase to II 10am. Pending 2nd opinion at [facility]. The patient stated she feels anxious after 2pm dose of Sinemet, but this is the first dose, too soon to eval, will observe.  01/01/21 Dr. Lyndel Safe advised to decrease 2pm Sinemet to 1.5 Tab.

## 2020-02-01 NOTE — Assessment & Plan Note (Signed)
Afib, heart rate is in control, on Xarelto 20mg  qd, off Diltiazem

## 2020-02-01 NOTE — Assessment & Plan Note (Signed)
Anemia, stable, Hgb12.1 10/11/19,takes Fe  

## 2020-02-01 NOTE — Assessment & Plan Note (Signed)
R hip pain, on Tylenol 1000mg tidprn, Methocarbamol 250mg qd.   

## 2020-02-01 NOTE — Assessment & Plan Note (Signed)
The patient has chronic edema BLE.Off Metolazone 09/27/19. 

## 2020-02-01 NOTE — Progress Notes (Addendum)
Location:    [facility] Room Number: 13 Place of Service:  SNF (31) Provider: Lennie Odor Rachele Lamaster NP  [name], MD  Patient Care Team: [name], MD as PCP - General (Internal Medicine) [name], [name], MD as PCP - Cardiology (Cardiology) [name], [name], DO as Consulting Physician (Neurology) Danika Kluender X, NP as Nurse Practitioner (Internal Medicine)  Extended Emergency Contact Information Primary Emergency Contact: Lavilla, [place] Mobile Phone: [phone] Relation: Daughter Interpreter needed? No Secondary Emergency Contact: [facility] Address: [address]          [place], [place] [address] [name] of [place] Phone: [phone] Mobile Phone: [phone] Relation: Son  Code Status:  DNR Goals of care: Advanced Directive information Advanced Directives 12/06/2019  Does Patient Have a Medical Advance Directive? Yes  Type of Advance Directive [place]  Does patient want to make changes to medical advance directive? No - Patient declined  Copy of [place] in Chart? Yes - validated most recent copy scanned in chart (See row information)  Would patient like information on creating a medical advance directive? -  Pre-existing out of facility DNR order (yellow form or pink MOST form) -     Chief Complaint  Patient presents with  . Medical Management of Chronic Issues    HPI:  Pt is a 80 y.o. female seen today for medical management of chronic diseases.     The patient has chronic edema BLE. Off Metolazone 09/27/19.  Afib, heart rate is in control, on Xarelto 35m qd, off Diltiazem  R hip pain, on Tylenol 10074mtidprn, Methocarbamol 25064md.  Parkinson's stable, f/u neurology, Dr. GupLyndel Safecreased Sinemet to II at 2pm, the same of the rest. Plan to increase to II 10am. [facility]            Orthostatic hypotension, persists,  but little symptomatic.  Anemia, stable, Hgb12.1 10/11/19,takes Fe GERD, stable, on Omeprazole 73m46m. Urge incontinent of urine, takes Mirabegron. Constipation, takes MiraLax      Past Medical History:  Diagnosis Date  . Acute lower UTI 09/14/2018  . Anemia 10/15/2018   2016 colonoscopy 10/14/18 wbc 5.4, Hgb 10.1, plt 184 12/01/18 wbc 4.5, Hgb 11.1, plt 188, neutrophils 63,  Na 139, K 3.7, Bun 23, creat 0.69, eGFR 83 on Fe, Hgb 11.7 12/09/18   . Atrial fibrillation (HCC)Annetta North1/2012   10/07/18 Na 143, K 4.0, Bun 20, creat 0.79, eGFR 72, wbc 7.1, Hgb 10.9, plt 172 10/28/18 Na 142, K 4.0, Bun 19, creat 0.77, eGFR 74 11/18/18 Na 144, K 4.1, Bun 21, creat 0.69, eGFR 83  . Bilateral lower extremity edema 07/19/2018   11/02/18 BMP 2 weeks.   . Cervical pain (neck) 06/20/2010  . Closed fracture of part of upper end of humerus 05/01/2015  . Colles' fracture of right radius 03/05/2015  . Constipation 07/19/2018  . DEGENERATIVE JOINT DISEASE, RIGHT HIP 02/16/2007  . Dupuytren's contracture   . Dysuria 09/20/2018   09/20/18 c/o got up several times last night to go urinate, burning on urination, lower abd /back discomfort, but urinary frequency, leakage are not new. UA C/S, Pyridium 100mg73m x 2 days.  09/21/18 wbc 6.1, Hgb 11.8, plt 210, neutrophil 69.4, Na 142, K 4.1, Bun 19, creat 0.78, TP 6.4, albumin 3.9  . Hematuria 02/04/2014  . Hypotension 09/18/2018  . Long term (current) use of anticoagulants 05/29/2016  . Osteoarthritis of  hip    right  . Osteoarthritis of left knee   . Overactive bladder 10/06/2018  . Parkinson disease ([place])   . Paroxysmal A-fib ([place])   . POSTMENOPAUSAL SYNDROME 02/16/2007  . PREMATURE ATRIAL CONTRACTIONS 02/16/2007  . 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 S/P breast biopsy, left    two o'clock position - benign  . 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 Tremor, unspecified 10/19/2015  . Unstable gait  02/16/2017  . Vaginal atrophy 10/19/2015  . Venous insufficiency   . Weakness 09/12/2018   Past Surgical History:  Procedure Laterality Date  . BREAST BIOPSY Left   . CATARACT EXTRACTION, BILATERAL      Allergies  Allergen Reactions  . Doxycycline Nausea And Vomiting  . Sulfonamide Derivatives     REACTION: HIVES    Allergies as of 02/01/2020      Reactions   Doxycycline Nausea And Vomiting   Sulfonamide Derivatives    REACTION: HIVES      Medication List       Accurate as of February 01, 2020 11:59 PM. If you have any questions, ask your nurse or doctor.        acetaminophen 500 MG tablet Commonly known as: TYLENOL Take 1,000 mg by mouth 3 (three) times daily as needed.   calcium carbonate 750 MG chewable tablet Commonly known as: TUMS EX Chew 1 tablet by mouth daily. Between the hours of 7am-10am.   carbidopa-levodopa 25-100 MG tablet Commonly known as: SINEMET IR Take 1.5 tablets by mouth 3 (three) times daily.   carbidopa-levodopa 50-200 MG tablet Commonly known as: SINEMET CR Take 1 tablet by mouth 3 (three) times daily.   carbidopa-levodopa 25-100 MG tablet Commonly known as: SINEMET IR Take 2 tablets by mouth daily.   ferrous sulfate 325 (65 FE) MG tablet Take 325 mg by mouth every Monday, Wednesday, and Friday. Give with food   hydrocortisone 2.5 % ointment Apply 1 application topically at bedtime as needed.   methocarbamol 500 MG tablet Commonly known as: ROBAXIN Take 250 mg by mouth daily. As needed   mirabegron ER 50 MG Tb24 tablet Commonly known as: MYRBETRIQ Take 50 mg by mouth daily. Between the hours of 7am-10am.   omeprazole 20 MG capsule Commonly known as: PRILOSEC Take 20 mg by mouth daily.   polyethylene glycol 17 g packet Commonly known as: MIRALAX / GLYCOLAX Take 17 g by mouth daily.   PreviDent 5000 Booster Plus 1.1 % Pste Generic drug: Sodium Fluoride Place 1 application onto teeth in the morning and at bedtime.    rivaroxaban 20 MG Tabs tablet Commonly known as: XARELTO Take 20 mg by mouth every evening. Between the hours of 5pm-6pm.   Vitamin D3 10 MCG (400 UNIT) Caps Take 1 capsule by mouth daily. Between the hours of 7am-10am.   zinc oxide 20 % ointment Apply 1 application topically as needed for irritation. To buttocks after every incontinent episode and as needed for redness. May keep at bedside.       Review of Systems  Constitutional: Positive for fatigue. Negative for fever and unexpected weight change.       Fatigue in pm  HENT: Positive for hearing loss. Negative for congestion and voice change.   Eyes: Negative for visual disturbance.  Respiratory: Negative for cough and shortness of breath.   Cardiovascular: Positive for leg swelling.  Gastrointestinal: Negative for abdominal pain and constipation.  Genitourinary: Positive for frequency and urgency. Negative for dysuria and hematuria.  Musculoskeletal: Positive for arthralgias, back pain and gait problem. Negative for myalgias.       Right hip pain is well controlled. Lower back discomfort positional.   Skin: Negative for color change.  Neurological: Positive for tremors. Negative for speech difficulty, light-headedness and headaches.       Moves slow, fine tremor in fingers, burning sensation  in the R+ L great toes when touched by sheets, comes/goes  Psychiatric/Behavioral: Negative for behavioral problems and sleep disturbance. The patient is nervous/anxious.        Sleeps from 9pm to 4-5am usually. Feels anxious since 2pm sinemet    Immunization History  Administered Date(s) Administered  . Influenza Split 10/20/2012  . Influenza Whole 10/21/2006  . Influenza, High Dose Seasonal PF 10/17/2016, 11/02/2019  . Influenza-Unspecified 10/20/2013, 10/22/2017  . Moderna Sars-Covid-2 Vaccination 01/22/2019, 02/19/2019  . Pneumococcal Conjugate-13 01/31/2013  . Pneumococcal Polysaccharide-23 01/20/2005, 11/24/2011  . Td  01/21/2004  . Tdap 03/16/2014  . Zoster 04/17/2009   Pertinent  Health Maintenance Due  Topic Date Due  . COLONOSCOPY (Pts 45-70yr Insurance coverage will need to be confirmed)  06/12/2019  . INFLUENZA VACCINE  Completed  . DEXA SCAN  Completed  . PNA vac Low Risk Adult  Completed   Fall Risk  05/03/2018 11/03/2017 08/04/2017 03/06/2017 11/26/2016  Falls in the past year? 1 No No Yes Yes  Number falls in past yr: 0 - - 2 or more 2 or more  Injury with Fall? 0 - - No No  Risk Factor Category  - - - High Fall Risk High Fall Risk  Risk for fall due to : Other (Comment) - - - -  Follow up Falls evaluation completed;Education provided;Falls prevention discussed - - Falls evaluation completed Falls evaluation completed   Functional Status Survey:    Vitals:   02/01/20 1645  BP: 116/78  Pulse: 76  Resp: 15  Temp: 98.7 F (37.1 C)  SpO2: 97%  Weight: 139 lb 9.6 oz (63.3 kg)  Height: _0  (1.727 m)   Body mass index is 21.23 kg/m. Physical Exam Vitals and nursing note reviewed.  Constitutional:      Appearance: Normal appearance.  HENT:     Head: Normocephalic and atraumatic.     Mouth/Throat:     Mouth: Mucous membranes are moist.  Eyes:     Extraocular Movements: Extraocular movements intact.     Conjunctiva/sclera: Conjunctivae normal.     Pupils: Pupils are equal, round, and reactive to light.  Cardiovascular:     Rate and Rhythm: Normal rate. Rhythm irregular.     Heart sounds: No murmur heard.     Comments: DP pulses present L>R Pulmonary:     Effort: Pulmonary effort is normal.     Breath sounds: No rales.  Abdominal:     General: Bowel sounds are normal.     Palpations: Abdomen is soft.  Musculoskeletal:     Cervical back: Normal range of motion and neck supple.     Right lower leg: Edema present.     Left lower leg: Edema present.     Comments: trace edema BLE. No decreased ROM of the left wrist.   Skin:    General: Skin is warm and dry.     Findings:  Bruising present.     Comments: Back of the left hand and left wrist ecchymoses.   Neurological:     General: No focal deficit present.     Mental Status: She is alert and  oriented to person, place, and time. Mental status is at baseline.     Coordination: Coordination abnormal.     Gait: Gait abnormal.     Comments: Moves slow, tremor in hands  Psychiatric:        Mood and Affect: Mood normal.        Behavior: Behavior normal.        Thought Content: Thought content normal.     Labs reviewed: Recent Labs    05/17/19 0000 07/19/19 0000 09/06/19 0000 10/11/19 0000  NA 140 138 139 141  K 3.6 4.1 4.0 4.1  CL 103 104 103 107  CO2 31* 29* 29* 27*  BUN 22* _0 CREATININE 0.7 0.7 0.8 0.8  CALCIUM 9.4 8.9  --  9.4   Recent Labs    03/24/19 0000 04/01/19 0000 09/06/19 0000  AST 11* 12* 9*  ALT 5*  --  3*  ALKPHOS 50 48 62  ALBUMIN 4.0 3.8 3.8   Recent Labs    03/24/19 0000 04/01/19 0000 05/17/19 0000 09/06/19 0000 10/11/19 0000  WBC 8.4   < > 5.2 4.9 4.2  NEUTROABS 6,602  --   --  3,450  --   HGB 10.9*   < > 11.4* 11.7* 12.1  HCT 34*   < > 34* 36 37  PLT 190   < > 184 181 157   < > = values in this interval not displayed.   Lab Results  Component Value Date   TSH 0.408 09/11/2018   No results found for: HGBA1C Lab Results  Component Value Date   CHOL 185 03/24/2019   HDL 79 (A) 03/24/2019   LDLCALC 94 03/24/2019   LDLDIRECT 104.2 01/25/2013   TRIG 41 03/24/2019   CHOLHDL 2 08/29/2015    Significant Diagnostic Results in last 30 days:  No results found.  Assessment/Plan  Atrial fibrillation (HCC) Afib, heart rate is in control, on Xarelto 49m qd, off Diltiazem   Edema of left lower extremity due to peripheral venous insufficiency The patient has chronic edema BLE. Off Metolazone 09/27/19.    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   [place]inson[place]  [place], f/u neurology, Dr. GupLyndel Safecreased Sinemet to II at 2pm, the same of the rest. Plan to increase to II 10am. [facility] stated she feels anxious after 2pm dose of Sinemet, but this is the first dose, too soon to eval, will observe.  01/01/21 Dr. GupLyndel Safevised to decrease 2pm Sinemet to 1.5 Tab.   Hypotension Orthostatic hypotension, persists, but little symptomatic.   Anemia Anemia, stable, Hgb12.1 10/11/19,takes Fe   GERD (gastroesophageal reflux disease) GERD, stable, on Omeprazole 51m76m.   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   Constipation Constipation, takes MiraFreight forwardermunication: plan of care reviewed with the patient and charge nurse.   Labs/tests ordered:  none  Time spend 35 minutes.

## 2020-02-01 NOTE — Assessment & Plan Note (Signed)
GERD, stable, on Omeprazole 20mg qd.  

## 2020-02-01 NOTE — Assessment & Plan Note (Signed)
Orthostatic hypotension, persists, but little symptomatic.   

## 2020-02-02 ENCOUNTER — Encounter: Payer: Self-pay | Admitting: Nurse Practitioner

## 2020-02-02 NOTE — Progress Notes (Signed)
This encounter was created in error - please disregard.

## 2020-02-03 ENCOUNTER — Encounter: Payer: Self-pay | Admitting: Family Medicine

## 2020-02-07 ENCOUNTER — Encounter: Payer: Self-pay | Admitting: Internal Medicine

## 2020-02-07 ENCOUNTER — Other Ambulatory Visit: Payer: Self-pay | Admitting: Family Medicine

## 2020-02-07 DIAGNOSIS — G20 Parkinson's disease: Secondary | ICD-10-CM

## 2020-02-07 NOTE — Progress Notes (Signed)
Location:   [facility] Room Number: 013 Place of Service:  SNF (31) Provider:  Gupta, Anjali MD Gupta, Anjali L, MD  Patient Care Team: Gupta, Anjali L, MD as PCP - General (Internal Medicine) [name], Philip J, MD as PCP - Cardiology (Cardiology) [name], Rebecca S, DO as Consulting Physician (Neurology) Mast, Man X, NP as Nurse Practitioner (Internal Medicine)  Extended Emergency Contact Information Primary Emergency Contact: Sachdev, [place] Mobile Phone: [phone] Relation: Daughter Interpreter needed? No Secondary Emergency Contact: Nau,Fred Address: [address]          [place], [place] [address] [place] Home Phone: [phone] Mobile Phone: [phone] Relation: Son  Code Status:  DNR Goals of care: Advanced Directive information Advanced Directives 12/06/2019  Does Patient Have a Medical Advance Directive? Yes  Type of Advance Directive Healthcare Power of Attorney  Does patient want to make changes to medical advance directive? No - Patient declined  Copy of Healthcare Power of Attorney in Chart? Yes - validated most recent copy scanned in chart (See row information)  Would patient like information on creating a medical advance directive? -  Pre-existing out of facility DNR order (yellow form or pink MOST form) -     Chief Complaint  Patient presents with  . Acute Visit    BP follow up     HPI:  Pt is a 79 y.o. female seen today for an acute visit for    Past Medical History:  Diagnosis Date  . Acute lower UTI 09/14/2018  . Anemia 10/15/2018   2016 colonoscopy 10/14/18 wbc 5.4, Hgb 10.1, plt 184 12/01/18 wbc 4.5, Hgb 11.1, plt 188, neutrophils 63,  Na 139, K 3.7, Bun 23, creat 0.69, eGFR 83 on Fe, Hgb 11.7 12/09/18   . Atrial fibrillation (HCC) 05/21/2010   10/07/18 Na 143, K 4.0, Bun 20, creat 0.79, eGFR 72, wbc 7.1, Hgb 10.9, plt 172 10/28/18 Na 142, K 4.0, Bun 19, creat 0.77, eGFR 74 11/18/18 Na 144, K 4.1, Bun 21,  creat 0.69, eGFR 83  . Bilateral lower extremity edema 07/19/2018   11/02/18 BMP 2 weeks.   . Cervical pain (neck) 06/20/2010  . Closed fracture of part of upper end of humerus 05/01/2015  . Colles' fracture of right radius 03/05/2015  . Constipation 07/19/2018  . DEGENERATIVE JOINT DISEASE, RIGHT HIP 02/16/2007  . Dupuytren's contracture   . Dysuria 09/20/2018   09/20/18 c/o got up several times last night to go urinate, burning on urination, lower abd /back discomfort, but urinary frequency, leakage are not new. UA C/S, Pyridium 100mg tid x 2 days.  09/21/18 wbc 6.1, Hgb 11.8, plt 210, neutrophil 69.4, Na 142, K 4.1, Bun 19, creat 0.78, TP 6.4, albumin 3.9  . Hematuria 02/04/2014  . Hypotension 09/18/2018  . Long term (current) use of anticoagulants 05/29/2016  . Osteoarthritis of hip    right  . Osteoarthritis of left knee   . Overactive bladder 10/06/2018  . Parkinson disease (HCC)   . Paroxysmal A-fib (HCC)   . POSTMENOPAUSAL SYNDROME 02/16/2007  . PREMATURE ATRIAL CONTRACTIONS 02/16/2007  . 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 S/P breast biopsy, left    two o'clock position - benign  . 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 Tremor, unspecified 10/19/2015  . Unstable gait 02/16/2017  . Vaginal atrophy 10/19/2015  . Venous insufficiency   . Weakness 09/12/2018   Past Surgical History:  Procedure Laterality Date  . BREAST BIOPSY Left   .   CATARACT EXTRACTION, BILATERAL      Allergies  Allergen Reactions  . Doxycycline Nausea And Vomiting  . Sulfonamide Derivatives     REACTION: HIVES    Allergies as of 02/07/2020      Reactions   Doxycycline Nausea And Vomiting   Sulfonamide Derivatives    REACTION: HIVES      Medication List       Accurate as of February 07, 2020  2:05 PM. If you have any questions, ask your nurse or doctor.        acetaminophen 500 MG tablet Commonly known as: TYLENOL Take 1,000 mg by mouth 3 (three) times daily as needed.   calcium  carbonate 750 MG chewable tablet Commonly known as: TUMS EX Chew 1 tablet by mouth daily. Between the hours of 7am-10am.   Carbidopa-Levodopa ER 25-100 MG tablet controlled release Commonly known as: SINEMET CR Take 1.5 tablets by mouth daily. What changed: Another medication with the same name was removed. Continue taking this medication, and follow the directions you see here. Changed by: [name], MD   carbidopa-levodopa 25-100 MG tablet Commonly known as: SINEMET IR Take 1.5 tablets by mouth daily. What changed: Another medication with the same name was removed. Continue taking this medication, and follow the directions you see here. Changed by: [name], MD   ferrous sulfate 325 (65 FE) MG tablet Take 325 mg by mouth every Monday, Wednesday, and Friday. Give with food   hydrocortisone 2.5 % ointment Apply 1 application topically at bedtime as needed.   methocarbamol 500 MG tablet Commonly known as: ROBAXIN Take 250 mg by mouth daily. As needed   mirabegron ER 50 MG Tb24 tablet Commonly known as: MYRBETRIQ Take 50 mg by mouth daily. Between the hours of 7am-10am.   omeprazole 20 MG capsule Commonly known as: PRILOSEC Take 20 mg by mouth daily.   polyethylene glycol 17 g packet Commonly known as: MIRALAX / GLYCOLAX Take 17 g by mouth daily.   PreviDent 5000 Booster Plus 1.1 % Pste Generic drug: Sodium Fluoride Place 1 application onto teeth in the morning and at bedtime.   rivaroxaban 20 MG Tabs tablet Commonly known as: XARELTO Take 20 mg by mouth every evening. Between the hours of 5pm-6pm.   Vitamin D3 10 MCG (400 UNIT) Caps Take 1 capsule by mouth daily. Between the hours of 7am-10am.   zinc oxide 20 % ointment Apply 1 application topically as needed for irritation. To buttocks after every incontinent episode and as needed for redness. May keep at bedside.       Review of Systems  Immunization History  Administered Date(s) Administered  .  Influenza Split 10/20/2012  . Influenza Whole 10/21/2006  . Influenza, High Dose Seasonal PF 10/17/2016, 11/02/2019  . Influenza-Unspecified 10/20/2013, 10/22/2017  . Moderna Sars-Covid-2 Vaccination 01/22/2019, 02/19/2019  . Pneumococcal Conjugate-13 01/31/2013  . Pneumococcal Polysaccharide-23 01/20/2005, 11/24/2011  . Td 01/21/2004  . Tdap 03/16/2014  . Zoster 04/17/2009   Pertinent  Health Maintenance Due  Topic Date Due  . COLONOSCOPY (Pts 45-21yr Insurance coverage will need to be confirmed)  06/12/2019  . INFLUENZA VACCINE  Completed  . DEXA SCAN  Completed  . PNA vac Low Risk Adult  Completed   Fall Risk  05/03/2018 11/03/2017 08/04/2017 03/06/2017 11/26/2016  Falls in the past year? 1 No No Yes Yes  Number falls in past yr: 0 - - 2 or more 2 or more  Injury with Fall? 0 - - No  No  Risk Factor Category  - - - High Fall Risk High Fall Risk  Risk for fall due to : Other (Comment) - - - -  Follow up Falls evaluation completed;Education provided;Falls prevention discussed - - Falls evaluation completed Falls evaluation completed   Functional Status Survey:    Vitals:   02/07/20 1348  BP: 90/68  Pulse: 84  Resp: 15  Temp: 98.2 F (36.8 C)  SpO2: 95%  Weight: 140 lb (63.5 kg)  Height: 5' 8" (1.727 m)   Body mass index is 21.29 kg/m. Physical Exam  Labs reviewed: Recent Labs    05/17/19 0000 07/19/19 0000 09/06/19 0000 10/11/19 0000  NA 140 138 139 141  K 3.6 4.1 4.0 4.1  CL 103 104 103 107  CO2 31* 29* 29* 27*  BUN 22* 20 16 20  CREATININE 0.7 0.7 0.8 0.8  CALCIUM 9.4 8.9  --  9.4   Recent Labs    03/24/19 0000 04/01/19 0000 09/06/19 0000  AST 11* 12* 9*  ALT 5*  --  3*  ALKPHOS 50 48 62  ALBUMIN 4.0 3.8 3.8   Recent Labs    03/24/19 0000 04/01/19 0000 05/17/19 0000 09/06/19 0000 10/11/19 0000  WBC 8.4   < > 5.2 4.9 4.2  NEUTROABS 6,602  --   --  3,450  --   HGB 10.9*   < > 11.4* 11.7* 12.1  HCT 34*   < > 34* 36 37  PLT 190   < > 184 181  157   < > = values in this interval not displayed.   Lab Results  Component Value Date   TSH 0.408 09/11/2018   No results found for: HGBA1C Lab Results  Component Value Date   CHOL 185 03/24/2019   HDL 79 (A) 03/24/2019   LDLCALC 94 03/24/2019   LDLDIRECT 104.2 01/25/2013   TRIG 41 03/24/2019   CHOLHDL 2 08/29/2015    Significant Diagnostic Results in last 30 days:  No results found.  Assessment/Plan There are no diagnoses linked to this encounter.   Family/ staff Communication:   Labs/tests ordered:   This encounter was created in error - please disregard. This encounter was created in error - please disregard. 

## 2020-02-21 ENCOUNTER — Non-Acute Institutional Stay (SKILLED_NURSING_FACILITY): Payer: Medicare PPO | Admitting: Internal Medicine

## 2020-02-21 ENCOUNTER — Encounter: Payer: Self-pay | Admitting: Internal Medicine

## 2020-02-21 DIAGNOSIS — I4819 Other persistent atrial fibrillation: Secondary | ICD-10-CM

## 2020-02-21 DIAGNOSIS — K219 Gastro-esophageal reflux disease without esophagitis: Secondary | ICD-10-CM

## 2020-02-21 DIAGNOSIS — N3941 Urge incontinence: Secondary | ICD-10-CM

## 2020-02-21 DIAGNOSIS — I872 Venous insufficiency (chronic) (peripheral): Secondary | ICD-10-CM | POA: Diagnosis not present

## 2020-02-21 DIAGNOSIS — G20 Parkinson's disease: Secondary | ICD-10-CM

## 2020-02-21 DIAGNOSIS — I959 Hypotension, unspecified: Secondary | ICD-10-CM

## 2020-02-21 NOTE — Progress Notes (Signed)
This encounter was created in error - please disregard.

## 2020-02-21 NOTE — Progress Notes (Unsigned)
Location:  [facility] Room Number: 13-A Place of Service:  SNF [phone]) Provider:  [name], MD  Patient Care Team: [name], MD as PCP - General (Internal Medicine) [name], [name], MD as PCP - Cardiology (Cardiology) [name], [name], DO as Consulting Physician (Neurology) Mast, Man X, NP as Nurse Practitioner (Internal Medicine)  Extended Emergency Contact Information Primary Emergency Contact: Kyilee, Gregg Mobile Phone: [phone] Relation: Daughter Interpreter needed? No Secondary Emergency Contact: [facility] Address: [address]          [place], [place] [address] [name] of [place] Phone: [phone] Mobile Phone: [phone] Relation: Son  Code Status:  DNR Goals of care: Advanced Directive information Advanced Directives 02/21/2020  Does Patient Have a Medical Advance Directive? Yes  Type of Paramedic of [place];Out of facility DNR (pink MOST or yellow form)  Does patient want to make changes to medical advance directive? No - Patient declined  Copy of [place] in Chart? -  Would patient like information on creating a medical advance directive? -  Pre-existing out of facility DNR order (yellow form or pink MOST form) -     Chief Complaint  Patient presents with  . Medical Management of Chronic Issues    Patient is seen for a routine [facility] visit    HPI:  Pt is an 80 y.o. female seen today for medical management of chronic diseases.    Patient has a history of Parkinson disease diagnosed 2 years ago on Sinemet and follows withNeurology, paroxysmal A. fib on Xarelto,  hyperlipidemia, H/o Ovarian Cyst, Lower extremity edema,2 D echo with N EF Biatrial enlargement Urinary Incontinence HadChronic Right Hip Pain. 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  Parkinson Disease Continues to  have issues. Does well during morning time. Symptoms return around 1.30 And then patient cannot do much Activities including doing her ADLS. We tried to increase her Sinemet but then her BP dropped. She is off Cardizem and Diuretics Her BP is very tenuous Runs around 80-90 in the morning She still works with Therapy in the morning. Able to walk with them with Mild Assist Has Appointment with Dr Rexene Alberts this week. Her daughter is also trying to get her to see Neurologist in [facility]  Today she was also c/o Inability to sometimes Urinate even when she knows her Bladder is full.   Past Medical History:  Diagnosis Date  . Acute lower UTI 09/14/2018  . Anemia 10/15/2018   2016 colonoscopy 10/14/18 wbc 5.4, Hgb 10.1, plt 184 12/01/18 wbc 4.5, Hgb 11.1, plt 188, neutrophils 63,  Na 139, K 3.7, Bun 23, creat 0.69, eGFR 83 on Fe, Hgb 11.7 12/09/18   . Atrial fibrillation ([place]) 05/21/2010   10/07/18 Na 143, K 4.0, Bun 20, creat 0.79, eGFR 72, wbc 7.1, Hgb 10.9, plt 172 10/28/18 Na 142, K 4.0, Bun 19, creat 0.77, eGFR 74 11/18/18 Na 144, K 4.1, Bun 21, creat 0.69, eGFR 83  . Bilateral lower extremity edema 07/19/2018   11/02/18 BMP 2 weeks.   . Cervical pain (neck) 06/20/2010  . Closed fracture of part of upper end of humerus 05/01/2015  . Colles' fracture of right radius 03/05/2015  . Constipation 07/19/2018  . DEGENERATIVE JOINT DISEASE, RIGHT HIP 02/16/2007  . Dupuytren's contracture   . Dysuria 09/20/2018   09/20/18 c/o got up several times last night to go  urinate, burning on urination, lower abd /back discomfort, but urinary frequency, leakage are not new. UA C/S, Pyridium $RemoveBefor'100mg'avPBigvLvSWx$  tid x 2 days.  09/21/18 wbc 6.1, Hgb 11.8, plt 210, neutrophil 69.4, Na 142, K 4.1, Bun 19, creat 0.78, TP 6.4, albumin 3.9  . Hematuria 02/04/2014  . Hypotension 09/18/2018  . Long term (current) use of anticoagulants 05/29/2016  . Osteoarthritis of hip    right  . Osteoarthritis of left knee   . Overactive bladder 10/06/2018  .  Parkinson disease ([place])   . Paroxysmal A-fib ([place])   . POSTMENOPAUSAL SYNDROME 02/16/2007  . PREMATURE ATRIAL CONTRACTIONS 02/16/2007  . 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 S/P breast biopsy, left    two o'clock position - benign  . 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 Tremor, unspecified 10/19/2015  . Unstable gait 02/16/2017  . Vaginal atrophy 10/19/2015  . Venous insufficiency   . Weakness 09/12/2018   Past Surgical History:  Procedure Laterality Date  . BREAST BIOPSY Left   . CATARACT EXTRACTION, BILATERAL      Allergies  Allergen Reactions  . Doxycycline Nausea And Vomiting  . Sulfonamide Derivatives     REACTION: HIVES    Outpatient Encounter Medications as of 02/21/2020  Medication Sig  . acetaminophen (TYLENOL) 500 MG tablet Take 1,000 mg by mouth 3 (three) times daily as needed.   . calcium carbonate (TUMS EX) 750 MG chewable tablet Chew 1 tablet by mouth daily. Between the hours of 7am-10am.  . carbidopa-levodopa (SINEMET CR) 50-200 MG tablet Take 1 tablet by mouth in the morning, at noon, and at bedtime.  . carbidopa-levodopa (SINEMET IR) 25-100 MG tablet Take 1.5 tablets by mouth in the morning, at noon, in the evening, and at bedtime.  . Cholecalciferol (VITAMIN D3) 10 MCG (400 UNIT) CAPS Take 1 capsule by mouth daily. Between the hours of 7am-10am.  . ferrous sulfate 325 (65 FE) MG tablet Take 325 mg by mouth every Monday, Wednesday, and Friday. Give with food  . hydrocortisone 2.5 % ointment Apply 1 application topically at bedtime as needed.   . methocarbamol (ROBAXIN) 500 MG tablet Take 250 mg by mouth daily. As needed  . mirabegron ER (MYRBETRIQ) 50 MG TB24 tablet Take 50 mg by mouth daily. Between the hours of 7am-10am.  . omeprazole (PRILOSEC) 20 MG capsule Take 20 mg by mouth daily.  . polyethylene glycol (MIRALAX / GLYCOLAX) 17 g packet Take 17 g by mouth daily.  . rivaroxaban (XARELTO) 20 MG TABS tablet Take 20 mg by mouth every evening.  Between the hours of 5pm-6pm.  . Sodium Fluoride (PREVIDENT 5000 BOOSTER PLUS) 1.1 % PSTE Place 1 application onto teeth in the morning and at bedtime.  Marland Kitchen zinc oxide 20 % ointment Apply 1 application topically as needed for irritation. To buttocks after every incontinent episode and as needed for redness. May keep at bedside.  . [DISCONTINUED] Carbidopa-Levodopa ER (SINEMET CR) 25-100 MG tablet controlled release Take 1.5 tablets by mouth daily.   No facility-administered encounter medications on file as of 02/21/2020.    Review of Systems  Constitutional: Positive for activity change.  HENT: Negative.   Respiratory: Negative.   Cardiovascular: Positive for leg swelling.  Gastrointestinal: Positive for constipation.  Genitourinary: Positive for difficulty urinating.  Musculoskeletal: Positive for gait problem.  Skin: Negative.   Neurological: Positive for weakness.  Psychiatric/Behavioral: Negative.     Immunization History  Administered Date(s) Administered  . Influenza Split 10/20/2012  . Influenza Whole  10/21/2006  . Influenza, High Dose Seasonal PF 10/17/2016, 11/02/2019  . Influenza-Unspecified 10/20/2013, 10/22/2017  . Moderna Sars-Covid-2 Vaccination 01/22/2019, 02/19/2019, 11/29/2019  . Pneumococcal Conjugate-13 01/31/2013  . Pneumococcal Polysaccharide-23 01/20/2005, 11/24/2011  . Td 01/21/2004  . Tdap 03/16/2014  . Zoster 04/17/2009   Pertinent  Health Maintenance Due  Topic Date Due  . INFLUENZA VACCINE  Completed  . DEXA SCAN  Completed  . PNA vac Low Risk Adult  Completed  . COLONOSCOPY (Pts 45-84yr Insurance coverage will need to be confirmed)  Discontinued   Fall Risk  05/03/2018 11/03/2017 08/04/2017 03/06/2017 11/26/2016  Falls in the past year? 1 No No Yes Yes  Number falls in past yr: 0 - - 2 or more 2 or more  Injury with Fall? 0 - - No No  Risk Factor Category  - - - High Fall Risk High Fall Risk  Risk for fall due to : Other (Comment) - - - -  Follow up  Falls evaluation completed;Education provided;Falls prevention discussed - - Falls evaluation completed Falls evaluation completed      Vitals:   02/21/20 1547  BP: 90/70  Pulse: 73  Resp: 15  Temp: (!) 97.1 F (36.2 C)  TempSrc: Oral  SpO2: 95%  Weight: 141 lb (64 kg)  Height: _0  (1.727 m)   Body mass index is 21.44 kg/m. Physical Exam Vitals reviewed.  Constitutional:      Appearance: Normal appearance.  HENT:     Head: Normocephalic.     Nose: Nose normal.     Mouth/Throat:     Mouth: Mucous membranes are moist.     Pharynx: Oropharynx is clear.  Eyes:     Pupils: Pupils are equal, round, and reactive to light.  Cardiovascular:     Rate and Rhythm: Normal rate. Rhythm irregular.     Pulses: Normal pulses.  Pulmonary:     Effort: Pulmonary effort is normal.     Breath sounds: Normal breath sounds.  Abdominal:     General: Abdomen is flat. Bowel sounds are normal.     Palpations: Abdomen is soft.  Musculoskeletal:     Cervical back: Neck supple.     Comments: Mild edema bilateral  Skin:    General: Skin is warm and dry.  Neurological:     General: No focal deficit present.     Mental Status: She is alert.     Labs reviewed: Recent Labs    05/17/19 0000 07/19/19 0000 09/06/19 0000 10/11/19 0000  NA 140 138 139 141  K 3.6 4.1 4.0 4.1  CL 103 104 103 107  CO2 31* 29* 29* 27*  BUN 22* _1 CREATININE 0.7 0.7 0.8 0.8  CALCIUM 9.4 8.9  --  9.4   Recent Labs    03/24/19 0000 04/01/19 0000 09/06/19 0000  AST 11* 12* 9*  ALT 5*  --  3*  ALKPHOS 50 48 62  ALBUMIN 4.0 3.8 3.8   Recent Labs    03/24/19 0000 04/01/19 0000 05/17/19 0000 09/06/19 0000 10/11/19 0000  WBC 8.4   < > 5.2 4.9 4.2  NEUTROABS 6,602  --   --  3,450  --   HGB 10.9*   < > 11.4* 11.7* 12.1  HCT 34*   < > 34* 36 37  PLT 190   < > 184 181 157   < > = values in this interval not displayed.   Lab Results  Component Value Date  TSH 0.408 09/11/2018    Lab  Results  Component Value Date   CHOL 185 03/24/2019   HDL 79 (A) 03/24/2019   LDLCALC 94 03/24/2019   LDLDIRECT 104.2 01/25/2013   TRIG 41 03/24/2019   CHOLHDL 2 08/29/2015    Assessment/Plan Persistent atrial fibrillation (HCC) On Xarelto Off Cardizem due to Low BP  Edema of left lower extremity due to peripheral venous insufficiency Maintaining with Ted hose Taken off diuretics due to Low BP  Parkinsonism, unspecified Parkinsonism type ([place]) Will continue same dose of Sinemet She did to tolerate higher dose due to Symptomatic Hypotension  Has appointment to see Dr Rexene Alberts and Possible eventually [facility]  Hypotension, unspecified hypotension type BP stays soft especially in the morning but gets better as day goes by Off Cardizem and Diuretics  Urge incontinence of urine On Mybetriq and c/o Difficulty in urination sometines Discussed with her we can decrase the dose Will wait for now.   Gastroesophageal reflux disease, unspecified whether esophagitis present On Prilosec Anemia On Iron Hgb stable  Family/ staff Communication: Daughter on the Phone  Labs/tests ordered:     Total time spent in this patient care encounter was  45_  minutes; greater than 50% of the visit spent counseling patient and staff, reviewing records , Labs and coordinating care for problems addressed at this encounter.

## 2020-02-22 ENCOUNTER — Ambulatory Visit: Payer: Medicare PPO | Admitting: Neurology

## 2020-02-23 ENCOUNTER — Ambulatory Visit: Payer: Medicare PPO | Admitting: Neurology

## 2020-02-23 ENCOUNTER — Encounter: Payer: Self-pay | Admitting: Neurology

## 2020-02-23 VITALS — BP 98/64 | HR 68

## 2020-02-23 DIAGNOSIS — I959 Hypotension, unspecified: Secondary | ICD-10-CM

## 2020-02-23 DIAGNOSIS — G20 Parkinson's disease: Secondary | ICD-10-CM | POA: Diagnosis not present

## 2020-02-23 NOTE — Patient Instructions (Signed)
It was nice to see you again today.  I think we should maintain you on your current medication regimen with Sinemet immediate release 1-1/2 pills 4 times a day and Sinemet long-acting 1 pill 3 times a day.  If you are interested in considering duopa, which is a levodopa pump with direct delivery continually into the GI tract, I would recommend that you discuss this with the movement disorder specialist at [facility] have an upcoming appointment in the next few months.  Unfortunately, I do not have enough experience with a duopa.   You can follow-up in about 3 to 4 months to see Stephanie Shaw, nurse practitioner.

## 2020-02-23 NOTE — Progress Notes (Signed)
Subjective:    Patient ID: Stephanie Shaw is a 80 y.o. female.  HPI     Interim history:   Stephanie Shaw is an 80-year-old right-handed woman with an underlying medical history of paroxysmal A. fib, osteoarthritis, chronic venous insufficiency and Dupuytren's contracture, who presents for follow-up consultation of her Parkinson's disease. The patient is unaccompanied today and came via transportation from SNF, her daughter is on speaker phone. I last saw her on 07/18/2019, At which time she reported feeling weaker.  She felt that her medication took about an hour to kick in.  She was on Sinemet IR 2 pills 4 times a day and Sinemet CR 1 pill at bedtime.  She was using the Malvika with assistance of a CNA and a gait belt.  She had had some looser stools.  She denied any issues with constipation.  She was advised to continue with her medication regimen.  She was advised to talk to her primary care about adjusting her constipation medication regimen.  She saw Amy Lomax, nurse practitioner in the interim on 11/21/2019, at which time a referral to [facility] was discussed.  In the interim, her primary care physician changed her Sinemet CR to 1 pill 3 times daily and reduced her Sinemet IR to 1-1/2 pills 4 times a day.  Today, 02/23/2020: She reports feeling generally fairly stable but she still has more symptoms in the afternoons.  Right around 2:00 or 2:30 PM she tends to get more stiffer and weaker.  She has recently experienced a significant increase in her neck stiffness and it seemed to radiate into her chest area.  This started around 4 PM and lasted for about an hour.  She was given Tylenol, 2 pills extra strength and she tried to distract herself and symptoms eventually improved.  She has had ongoing issues with low blood pressure values.  She has not had any recent falls.  She sees physical therapy in the mornings typically once daily and does walk with help and with a Noreta a little bit at a  time. Her daughter is interested in considering duopa.  They are advised that I do not have much in the way of actual experience with this and would defer to the expertise of the neurologist at [facility].  Patient has an upcoming appointment in July.  Per MAR, She is currently on Sinemet CR 3 times a day and Sinemet IR 1-1/2 pills 4 times a day.  When she increased the 2:30 PM dose to 2 pills, she felt worse so it was cut back to 1-1/2 pills again.  The patient's allergies, current medications, family history, past medical history, past social history, past surgical history and problem list were reviewed and updated as appropriate.    Previously:    I first met her on 01/12/2019 at the request of her primary care physician, at which time she reported a prior diagnosis of Parkinson's disease. She was advised to continue her medication regimen.    She saw Amy Lomax, nurse practitioner in the interim on 05/16/2019, at which time her blood pressure was notably low. She was advised to decrease Sinemet IR to 1-1/2 pills 4 times a day and add a morning dose of Sinemet CR in addition to her evening dose. She preferred to continue her current regimen.       (She) was diagnosed with Parkinson's disease in 2018.  She believes she has had symptoms for several years, does recall that she had left-sided   symptoms in the beginning.  Over time, she has had more stiffness, decline in mobility, constipation, low blood pressure issues, and has fallen.  [facility].  She fell in the summer and feels that she had a more steeper decline since the summer. She has been followed by Dr. [name] at [facility], had a visit on 09/24/2018.  She was on Sinemet 1.5 pills 4 times a day, 25-100 mg strength.  She was hospitalized after several falls in August 2020.  She was diagnosed with rhabdomyolysis.  She went to rehab after that.  She had an abnormal DaTscan on 09/25/2016 and I reviewed the  results: IMPRESSION: Poor uptake of dopamine transport specific radiotracer within the LEFT and RIGHT putamen and decrease relative uptake in the head of the RIGHT caudate nucleus is in pattern typical of Parkinson's syndrome.   She also had a telemedicine visit with Dr. [name] on 12/23/2018 which I reviewed: Her Sinemet was changed to 2 pills 4 times a day and she was advised to start Sinemet CR 50-200 mg strength at bedtime.  She was advised to follow-up with her primary care physician regarding anxiety and depression symptoms and to follow-up with urology regarding her urinary incontinence.   She reports that she has an appointment with urology.  She reports constipation and indicates that she has a bowel movement typically every other day, sometimes every third day.  In reviewing her MAR, I do not see that she has any as needed medication for stool softener or laxative.  She denies any hallucinations, she does have difficulty maintaining sleep.  She indicates nocturia about once or twice per average night which is better since the summer.  She also was treated for a UTI in the summer.  She reports no significant difficulty falling asleep but she does wake up in the middle of the night and has trouble going back to sleep.  She feels that the increase in the Sinemet and the addition of the long-acting Sinemet has been helpful.  She has physical therapy at her skilled nursing facility.  She usually is able to walk with her Janazia, she indicates that she has a 2 wheeled Sabirin which she did not bring today, she is in a wheelchair.  She denies a family history of Parkinson's disease with the exception of one paternal cousin that had Parkinson's disease.   Her Past Medical History Is Significant For: Past Medical History:  Diagnosis Date  . Acute lower UTI 09/14/2018  . Anemia 10/15/2018   2016 colonoscopy 10/14/18 wbc 5.4, Hgb 10.1, plt 184 12/01/18 wbc 4.5, Hgb 11.1, plt 188, neutrophils 63,  Na 139, K 3.7,  Bun 23, creat 0.69, eGFR 83 on Fe, Hgb 11.7 12/09/18   . Atrial fibrillation (HCC) 05/21/2010   10/07/18 Na 143, K 4.0, Bun 20, creat 0.79, eGFR 72, wbc 7.1, Hgb 10.9, plt 172 10/28/18 Na 142, K 4.0, Bun 19, creat 0.77, eGFR 74 11/18/18 Na 144, K 4.1, Bun 21, creat 0.69, eGFR 83  . Bilateral lower extremity edema 07/19/2018   11/02/18 BMP 2 weeks.   . Cervical pain (neck) 06/20/2010  . Closed fracture of part of upper end of humerus 05/01/2015  . Colles' fracture of right radius 03/05/2015  . Constipation 07/19/2018  . DEGENERATIVE JOINT DISEASE, RIGHT HIP 02/16/2007  . Dupuytren's contracture   . Dysuria 09/20/2018   09/20/18 c/o got up several times last night to go urinate, burning on urination, lower   abd /back discomfort, but urinary frequency, leakage are not new. UA C/S, Pyridium 1106m tid x 2 days.  09/21/18 wbc 6.1, Hgb 11.8, plt 210, neutrophil 69.4, Na 142, K 4.1, Bun 19, creat 0.78, TP 6.4, albumin 3.9  . Hematuria 02/04/2014  . Hypotension 09/18/2018  . Long term (current) use of anticoagulants 05/29/2016  . Osteoarthritis of hip    right  . Osteoarthritis of left knee   . Overactive bladder 10/06/2018  . Parkinson disease ([facility]   . Paroxysmal A-fib ([facility]   . POSTMENOPAUSAL SYNDROME 02/16/2007  . PREMATURE ATRIAL CONTRACTIONS 02/16/2007  . 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 S/P breast biopsy, left    two o'clock position - benign  . 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 Tremor, unspecified 10/19/2015  . Unstable gait 02/16/2017  . Vaginal atrophy 10/19/2015  . Venous insufficiency   . Weakness 09/12/2018    Her Past Surgical History Is Significant For: Past Surgical History:  Procedure Laterality Date  . BREAST BIOPSY Left   . CATARACT EXTRACTION, BILATERAL      Her Family History Is Significant For: Family History  Problem Relation Age of Onset  . Cancer Father        ? type  . Alcohol abuse Father   . Cancer Mother        Pancreatic  . Diabetes Mother   .  Heart failure Mother   . Osteoarthritis Sister   . Healthy Sister   . Healthy Child   . Breast cancer Neg Hx     Her Social History Is Significant For: Social History   Socioeconomic History  . Marital status: Divorced    Spouse name: Not on file  . Number of children: 2  . Years of education: Not on file  . Highest education level: Master's degree (e.g., MA, MS, MEng, MEd, MSW, MBA)  Occupational History  . Occupation: retired    Comment: math, [employer]  Tobacco Use  . Smoking status: Former Smoker    Quit date: 08/27/1971    Years since quitting: 48.5  . Smokeless tobacco: Never Used  Vaping Use  . Vaping Use: Never used  Substance and Sexual Activity  . Alcohol use: No    Alcohol/week: 0.0 standard drinks  . Drug use: No  . Sexual activity: Not Currently    Comment: 1st intercourse 80 yo-Fewer than 5 partners  Other Topics Concern  . Not on file  Social History Narrative  . Not on file   Social Determinants of Health   Financial Resource Strain: Not on file  Food Insecurity: Not on file  Transportation Needs: Not on file  Physical Activity: Not on file  Stress: Not on file  Social Connections: Not on file    Her Allergies Are:  Allergies  Allergen Reactions  . Doxycycline Nausea And Vomiting  . Sulfonamide Derivatives     REACTION: HIVES  :   Her Current Medications Are:  Outpatient Encounter Medications as of 02/23/2020  Medication Sig  . acetaminophen (TYLENOL) 500 MG tablet Take 1,000 mg by mouth 3 (three) times daily as needed.   . calcium carbonate (TUMS EX) 750 MG chewable tablet Chew 1 tablet by mouth daily. Between the hours of 7am-10am.  . carbidopa-levodopa (SINEMET CR) 50-200 MG tablet Take 1 tablet by mouth in the morning, at noon, and at bedtime.  . Cholecalciferol (VITAMIN D3) 10 MCG (400 UNIT) CAPS Take 1 capsule by mouth daily. Between the hours of 7am-10am.  .  ferrous sulfate 325 (65 FE) MG tablet Take 325 mg by mouth every Monday, Wednesday,  and Friday. Give with food  . hydrocortisone 2.5 % ointment Apply 1 application topically at bedtime as needed.   . methocarbamol (ROBAXIN) 500 MG tablet Take 250 mg by mouth daily. As needed  . mirabegron ER (MYRBETRIQ) 50 MG TB24 tablet Take 50 mg by mouth daily. Between the hours of 7am-10am.  . omeprazole (PRILOSEC) 20 MG capsule Take 20 mg by mouth daily.  . polyethylene glycol (MIRALAX / GLYCOLAX) 17 g packet Take 17 g by mouth daily.  . rivaroxaban (XARELTO) 20 MG TABS tablet Take 20 mg by mouth every evening. Between the hours of 5pm-6pm.  . Sodium Fluoride (PREVIDENT 5000 BOOSTER PLUS) 1.1 % PSTE Place 1 application onto teeth in the morning and at bedtime.  Marland Kitchen zinc oxide 20 % ointment Apply 1 application topically as needed for irritation. To buttocks after every incontinent episode and as needed for redness. May keep at bedside.  . carbidopa-levodopa (SINEMET IR) 25-100 MG tablet Take 1.5 tablets by mouth in the morning, at noon, in the evening, and at bedtime.   No facility-administered encounter medications on file as of 02/23/2020.  :  Review of Systems:  Out of a complete 14 point review of systems, all are reviewed and negative with the exception of these symptoms as listed below:  Review of Systems  Neurological:       Here to f/u on PD. She reports she struggles the most in the afternoon ( freezing spells). Pt reports her mornings are better. No falls since her last visit with [place].     Objective:  Neurological Exam  Physical Exam Physical Examination:   Vitals:   02/23/20 1040  BP: 98/64  Pulse: 68  SpO2: 96%    General Examination: The patient is a very pleasant 80 y.o. female in no acute distress. She appears frail, well-groomed.   HEENT:Normocephalic, atraumatic, pupils are equal, round and reactive to light,extraocular tracking shows saccadic breakdown, corrective eyeglasses in place.  Moderate facial masking, has some excess moisture around the corner of the  right mouth, no frank drooling.  She has significant hypophonia and mild to moderate dysarthria, at times she is difficult to understand.  She has moderate to severe nuchal rigidity.   Chest:is clear to auscultation without wheezing, rhonchi or crackles noted.  Heart:irregularly irregular.  Abdomen:is soft, non-tender and non-distended with normal bowel sounds appreciated on auscultation.  Extremities:There is1+ pitting edema in the distal lower extremities. She is wearing compression socks or stockings up to the knees bilaterally.  Skin: is warm and dry with no trophic changes noted. Age-related changes are noted on the skin.   Musculoskeletal: exam revealsArthritic changes in both hands.  Neurologically:  Mental status: The patient is awake and alert, payinggoodattention. Sheis Able to provide her history and relate her symptoms fairly well.Her memory appears to be at least mildly impaired. She has no obvious mood related issues, does not appear to be overly anxious. She has no obvious hallucinations.Some degree ofbradyphrenia noted, somewhat slow to respond at times.  Cranial nerves are as described above under HEENT exam. (On12/23/2020:On Archimedes spiral drawing, she has insecurity with both the right and the left hand, left hand a little worse which is her nondominant hand.)  She has no obvious resting tremor, no obvious action or postural tremor, no intention tremor today.  She has moderate to severe difficulty with fine motor skills, more noticeable on the left.  She is in the wheelchair, I did not have her stand or walk for me today.  She leans to the left in the wheelchair and slightly forward.  Reflexes are 1+ in the upper extremities and absent in the lower extremities.  Tone is increased in the extremities.  Strength is overall 4 out of 5. Cerebellar testing shows no dysmetria or intention tremor.   Sensory exam is intact to light touch in the upper and  lower extremities.  Assessmentand Plan:   In summary,Antanisha [employer] a very pleasant 39 year oldfemalewith an underlying medical history of paroxysmal A. fib, osteoarthritis, chronic venous insufficiency and Dupuytren's contracture, who presents for follow-up consultation of her Parkinson's disease. Her history and examination are in keeping with left-sided predominant Parkinson's disease. She has advanced findings.   She has struggled with freezing and fatigue.  She has had low blood pressure values.  She is currently on Sinemet IR 1-1/2 pills 4 times a day and Sinemet CR 50-200 mg strength 1 pill twice daily.  She seems to tolerate this medication but is still having low blood pressure values and her most significant difficulty motor wise is in the afternoons.  She is advised to continue with the current regimen.  She is having some physical therapy and uses the Michelene a little bit, always with assistance.  She has an appointment with [facility] neurology in July.  We talked about other treatment options did not suggest adding any new medication at this time. I advised her daughter to discuss the possibility of utilizing duopa with the movement disorder specialist at [facility].  I do not have enough experience with this treatment.   Of note, she had seen Dr. Carles Collet in the past; she had a DaTscan in 2018 which was supportive of her diagnosis.  She is advised to follow-up with one of our nurse practitioners in 3-4 months and continue with the current medication regimen. I answered all her questions today and the patient and her daughter were in agreement with the plan.

## 2020-03-19 DIAGNOSIS — L821 Other seborrheic keratosis: Secondary | ICD-10-CM | POA: Diagnosis not present

## 2020-03-19 DIAGNOSIS — D692 Other nonthrombocytopenic purpura: Secondary | ICD-10-CM | POA: Diagnosis not present

## 2020-03-19 DIAGNOSIS — L820 Inflamed seborrheic keratosis: Secondary | ICD-10-CM | POA: Diagnosis not present

## 2020-03-19 DIAGNOSIS — L814 Other melanin hyperpigmentation: Secondary | ICD-10-CM | POA: Diagnosis not present

## 2020-03-19 DIAGNOSIS — L570 Actinic keratosis: Secondary | ICD-10-CM | POA: Diagnosis not present

## 2020-03-20 ENCOUNTER — Encounter: Payer: Self-pay | Admitting: Internal Medicine

## 2020-03-20 ENCOUNTER — Non-Acute Institutional Stay (SKILLED_NURSING_FACILITY): Payer: Medicare PPO | Admitting: Internal Medicine

## 2020-03-20 DIAGNOSIS — Z7189 Other specified counseling: Secondary | ICD-10-CM

## 2020-03-20 DIAGNOSIS — K219 Gastro-esophageal reflux disease without esophagitis: Secondary | ICD-10-CM | POA: Diagnosis not present

## 2020-03-20 DIAGNOSIS — G20 Parkinson's disease: Secondary | ICD-10-CM | POA: Diagnosis not present

## 2020-03-20 DIAGNOSIS — I872 Venous insufficiency (chronic) (peripheral): Secondary | ICD-10-CM | POA: Diagnosis not present

## 2020-03-20 DIAGNOSIS — I4819 Other persistent atrial fibrillation: Secondary | ICD-10-CM

## 2020-03-20 DIAGNOSIS — I959 Hypotension, unspecified: Secondary | ICD-10-CM | POA: Diagnosis not present

## 2020-03-20 DIAGNOSIS — G20C Parkinsonism, unspecified: Secondary | ICD-10-CM

## 2020-03-20 NOTE — Progress Notes (Signed)
Location:   [place] Room Number: [place] of Service:  SNF [id number]) Provider:  Veleta Miners MD  [name], MD  Patient Care Team: [name], MD as PCP - General (Internal Medicine) [name], [name], MD as PCP - Cardiology (Cardiology) [name], [name], DO as Consulting Physician (Neurology) Mast, Man X, NP as Nurse Practitioner (Internal Medicine)  Extended Emergency Contact Information Primary Emergency Contact: Jahni, Paul Mobile Phone: [phone] Relation: Daughter Interpreter needed? No Secondary Emergency Contact: [facility] Address: [address]          [place], [place] [address] [name] of [place] Phone: [phone] Mobile Phone: [phone] Relation: Son  Code Status:  DNR Managed Care Goals of care: Advanced Directive information Advanced Directives 02/21/2020  Does Patient Have a Medical Advance Directive? Yes  Type of Paramedic of [place];Out of facility DNR (pink MOST or yellow form)  Does patient want to make changes to medical advance directive? No - Patient declined  Copy of [place] in Chart? -  Would patient like information on creating a medical advance directive? -  Pre-existing out of facility DNR order (yellow form or pink MOST form) -     Chief Complaint  Patient presents with  . Acute Visit    Discussion of her prognosis and MOST form per her request    HPI:  Pt is a 80 y.o. female seen today for an acute visit per Patient request to discuss MOST form and her Prognosis  Patient has a history of Parkinson disease diagnosed 2 years ago on Sinemet and follows withNeurology, paroxysmal A. fib on Xarelto,  hyperlipidemia, H/o Ovarian Cyst, Lower extremity edema,2 D echo with N EF Biatrial enlargement Urinary Incontinence HadChronic Right Hip Pain. 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  Parkinson Disease  Does well during morning time. Symptoms return around 1.30 And then patient cannot do much Activities including doing her ADLS. We tried to increase her Sinemet but then her BP dropped. She is off Cardizem and Diuretics Her BP is very tenuous Runs around 80-90 in the morning She still works with Therapy in the morning. Able to walk with them with Mild Assist Has appointment to see Neurologist in [facility]  Having no Other Issues Stays Cognitively intact Very Emotional about her Prognosis and needing help with her ADLS Wants to make sure I talk to her daughter her HCPOA that she does not want her Life Prolonged with Aggressive measures  Past Medical History:  Diagnosis Date  . Acute lower UTI 09/14/2018  . Anemia 10/15/2018   2016 colonoscopy 10/14/18 wbc 5.4, Hgb 10.1, plt 184 12/01/18 wbc 4.5, Hgb 11.1, plt 188, neutrophils 63,  Na 139, K 3.7, Bun 23, creat 0.69, eGFR 83 on Fe, Hgb 11.7 12/09/18   . Atrial fibrillation ([place]) 05/21/2010   10/07/18 Na 143, K 4.0, Bun 20, creat 0.79, eGFR 72, wbc 7.1, Hgb 10.9, plt 172 10/28/18 Na 142, K 4.0, Bun 19, creat 0.77, eGFR 74 11/18/18 Na 144, K 4.1, Bun 21, creat 0.69, eGFR 83  . Bilateral lower extremity edema 07/19/2018   11/02/18 BMP 2 weeks.   . Cervical pain (neck) 06/20/2010  . Closed fracture of part of upper end of humerus 05/01/2015  . Colles' fracture of right radius 03/05/2015  . Constipation 07/19/2018  . DEGENERATIVE JOINT DISEASE, RIGHT HIP 02/16/2007  . Dupuytren's contracture   .  Dysuria 09/20/2018   09/20/18 c/o got up several times last night to go urinate, burning on urination, lower abd /back discomfort, but urinary frequency, leakage are not new. UA C/S, Pyridium 150m tid x 2 days.  09/21/18 wbc 6.1, Hgb 11.8, plt 210, neutrophil 69.4, Na 142, K 4.1, Bun 19, creat 0.78, TP 6.4, albumin 3.9  . Hematuria 02/04/2014  . Hypotension 09/18/2018  . Long term (current) use of anticoagulants 05/29/2016  .  Osteoarthritis of hip    right  . Osteoarthritis of left knee   . Overactive bladder 10/06/2018  . Parkinson disease ([place]   . Paroxysmal A-fib ([place]   . POSTMENOPAUSAL SYNDROME 02/16/2007  . PREMATURE ATRIAL CONTRACTIONS 02/16/2007  . 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 S/P breast biopsy, left    two o'clock position - benign  . 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 Tremor, unspecified 10/19/2015  . Unstable gait 02/16/2017  . Vaginal atrophy 10/19/2015  . Venous insufficiency   . Weakness 09/12/2018   Past Surgical History:  Procedure Laterality Date  . BREAST BIOPSY Left   . CATARACT EXTRACTION, BILATERAL      Allergies  Allergen Reactions  . Doxycycline Nausea And Vomiting  . Sulfonamide Derivatives     REACTION: HIVES    Allergies as of 03/20/2020      Reactions   Doxycycline Nausea And Vomiting   Sulfonamide Derivatives    REACTION: HIVES      Medication List       Accurate as of March 20, 2020  1:42 PM. If you have any questions, ask your nurse or doctor.        acetaminophen 500 MG tablet Commonly known as: TYLENOL Take 1,000 mg by mouth 3 (three) times daily as needed.   calcium carbonate 750 MG chewable tablet Commonly known as: TUMS EX Chew 1 tablet by mouth daily. Between the hours of 7am-10am.   carbidopa-levodopa 25-100 MG tablet Commonly known as: SINEMET IR Take 1.5 tablets by mouth in the morning, at noon, in the evening, and at bedtime. What changed: Another medication with the same name was removed. Continue taking this medication, and follow the directions you see here. Changed by: AVirgie Dad MD   ferrous sulfate 325 (65 FE) MG tablet Take 325 mg by mouth every Monday, Wednesday, and Friday. Give with food   hydrocortisone 2.5 % ointment Apply 1 application topically at bedtime as needed.   methocarbamol 500 MG tablet Commonly known as: ROBAXIN Take 250 mg by mouth daily. As needed   mirabegron ER 50 MG Tb24  tablet Commonly known as: MYRBETRIQ Take 50 mg by mouth daily. Between the hours of 7am-10am.   omeprazole 20 MG capsule Commonly known as: PRILOSEC Take 20 mg by mouth daily.   polyethylene glycol 17 g packet Commonly known as: MIRALAX / GLYCOLAX Take 17 g by mouth daily.   PreviDent 5000 Booster Plus 1.1 % Pste Generic drug: Sodium Fluoride Place 1 application onto teeth in the morning and at bedtime.   rivaroxaban 20 MG Tabs tablet Commonly known as: XARELTO Take 20 mg by mouth every evening. Between the hours of 5pm-6pm.   Vitamin D3 10 MCG (400 UNIT) Caps Take 1 capsule by mouth daily. Between the hours of 7am-10am.   zinc oxide 20 % ointment Apply 1 application topically as needed for irritation. To buttocks after every incontinent episode and as needed for redness. May keep at bedside.       Review  of Systems  Constitutional: Positive for activity change.  HENT: Negative.   Respiratory: Negative.   Cardiovascular: Positive for leg swelling.  Gastrointestinal: Negative.   Genitourinary: Negative.   Musculoskeletal: Positive for gait problem.  Neurological: Positive for weakness.  Psychiatric/Behavioral: Negative.   All other systems reviewed and are negative.   Immunization History  Administered Date(s) Administered  . Influenza Split 10/20/2012  . Influenza Whole 10/21/2006  . Influenza, High Dose Seasonal PF 10/17/2016, 11/02/2019  . Influenza-Unspecified 10/20/2013, 10/22/2017  . Moderna Sars-Covid-2 Vaccination 01/22/2019, 02/19/2019, 11/29/2019  . Pneumococcal Conjugate-13 01/31/2013  . Pneumococcal Polysaccharide-23 01/20/2005, 11/24/2011  . Td 01/21/2004  . Tdap 03/16/2014  . Zoster 04/17/2009   Pertinent  Health Maintenance Due  Topic Date Due  . INFLUENZA VACCINE  Completed  . DEXA SCAN  Completed  . PNA vac Low Risk Adult  Completed  . COLONOSCOPY (Pts 45-44yr Insurance coverage will need to be confirmed)  Discontinued   Fall Risk   05/03/2018 11/03/2017 08/04/2017 03/06/2017 11/26/2016  Falls in the past year? 1 No No Yes Yes  Number falls in past yr: 0 - - 2 or more 2 or more  Injury with Fall? 0 - - No No  Risk Factor Category  - - - High Fall Risk High Fall Risk  Risk for fall due to : Other (Comment) - - - -  Follow up Falls evaluation completed;Education provided;Falls prevention discussed - - Falls evaluation completed Falls evaluation completed   Functional Status Survey:    Vitals:   03/20/20 1105  BP: 106/76  Pulse: 66  Resp: 18  Temp: (!) 97.1 F (36.2 C)  SpO2: 95%  Weight: 139 lb 1.6 oz (63.1 kg)  Height: '5\' 8"'  (1.727 m)   Body mass index is 21.15 kg/m. Physical Exam Vitals reviewed.  Constitutional:      Appearance: Normal appearance.  HENT:     Head: Normocephalic.     Nose: Nose normal.     Mouth/Throat:     Mouth: Mucous membranes are moist.     Pharynx: Oropharynx is clear.  Eyes:     Pupils: Pupils are equal, round, and reactive to light.  Cardiovascular:     Rate and Rhythm: Normal rate. Rhythm irregular.     Pulses: Normal pulses.  Pulmonary:     Effort: Pulmonary effort is normal.     Breath sounds: Normal breath sounds.  Abdominal:     General: Abdomen is flat. Bowel sounds are normal.     Palpations: Abdomen is soft.  Musculoskeletal:        General: Swelling present.     Cervical back: Neck supple.  Skin:    General: Skin is warm.  Neurological:     General: No focal deficit present.     Mental Status: She is alert.  Psychiatric:        Mood and Affect: Mood normal.        Thought Content: Thought content normal.        Judgment: Judgment normal.     Labs reviewed: Recent Labs    05/17/19 0000 07/19/19 0000 09/06/19 0000 10/11/19 0000  NA 140 138 139 141  K 3.6 4.1 4.0 4.1  CL 103 104 103 107  CO2 31* 29* 29* 27*  BUN 22* '20 16 20  ' CREATININE 0.7 0.7 0.8 0.8  CALCIUM 9.4 8.9  --  9.4   Recent Labs    03/24/19 0000 04/01/19 0000 09/06/19 0000   AST 11* 12*  9*  ALT 5*  --  3*  ALKPHOS 50 48 62  ALBUMIN 4.0 3.8 3.8   Recent Labs    03/24/19 0000 04/01/19 0000 05/17/19 0000 09/06/19 0000 10/11/19 0000  WBC 8.4   < > 5.2 4.9 4.2  NEUTROABS 6,602  --   --  3,450  --   HGB 10.9*   < > 11.4* 11.7* 12.1  HCT 34*   < > 34* 36 37  PLT 190   < > 184 181 157   < > = values in this interval not displayed.   Lab Results  Component Value Date   TSH 0.408 09/11/2018   No results found for: HGBA1C Lab Results  Component Value Date   CHOL 185 03/24/2019   HDL 79 (A) 03/24/2019   LDLCALC 94 03/24/2019   LDLDIRECT 104.2 01/25/2013   TRIG 41 03/24/2019   CHOLHDL 2 08/29/2015    Significant Diagnostic Results in last 30 days:  No results found.  Assessment/Plan  Parkinsonism, unspecified Parkinsonism type ([place]) Continue on Sinemet Has not tolerated higher doses due to Hypotension Planning to go and see Specialist in [place] to have issues with her ability to do ADLS especially in the evening  Persistent atrial fibrillation ([place]) On Xarelto  Off Cardizem due to Low BP Rate is stable Edema of left lower extremity due to peripheral venous insufficiency Ted hoses Has not tolerated Diurtetics due to Dizziness and Low BP Gastroesophageal reflux disease, unspecified whether esophagitis present pn Prilosec Hypotension, unspecified hypotension type Stable Cannot tolerate high doses of Sinemet Urinary Incontinence On Myrbetriq ACP Discussed in detail with her and her daughter  MOSt form was filled Patient has made it clear that she does not want Aggressive measures Limited Interventions   Family/ staff Communication:   Labs/tests ordered:  CBC,CMP,TSh  Total time spent in this patient care encounter was  45_  minutes; greater than 50% of the visit spent counseling patient and staff, reviewing records , Labs and coordinating care for problems addressed at this encounter.

## 2020-03-22 DIAGNOSIS — R531 Weakness: Secondary | ICD-10-CM | POA: Diagnosis not present

## 2020-03-22 DIAGNOSIS — E785 Hyperlipidemia, unspecified: Secondary | ICD-10-CM | POA: Diagnosis not present

## 2020-03-22 DIAGNOSIS — D649 Anemia, unspecified: Secondary | ICD-10-CM | POA: Diagnosis not present

## 2020-03-23 LAB — CBC AND DIFFERENTIAL
HCT: 36 (ref 36–46)
Hemoglobin: 11.5 — AB (ref 12.0–16.0)
Neutrophils Absolute: 2934
Platelets: 164 (ref 150–399)
WBC: 4.5

## 2020-03-23 LAB — BASIC METABOLIC PANEL
BUN: 17 (ref 4–21)
CO2: 28 — AB (ref 13–22)
Chloride: 107 (ref 99–108)
Creatinine: 0.8 (ref 0.5–1.1)
Glucose: 79
Potassium: 4.5 (ref 3.4–5.3)
Sodium: 141 (ref 137–147)

## 2020-03-23 LAB — COMPREHENSIVE METABOLIC PANEL
Albumin: 3.5 (ref 3.5–5.0)
Calcium: 9.2 (ref 8.7–10.7)
Globulin: 2.3

## 2020-03-23 LAB — HEPATIC FUNCTION PANEL
ALT: 3 — AB (ref 7–35)
AST: 9 — AB (ref 13–35)
Alkaline Phosphatase: 58 (ref 25–125)
Bilirubin, Total: 0.7

## 2020-03-23 LAB — CBC: RBC: 3.79 — AB (ref 3.87–5.11)

## 2020-03-23 LAB — TSH: TSH: 2.67 (ref 0.41–5.90)

## 2020-03-27 ENCOUNTER — Non-Acute Institutional Stay (SKILLED_NURSING_FACILITY): Payer: Medicare PPO | Admitting: Nurse Practitioner

## 2020-03-27 ENCOUNTER — Encounter: Payer: Self-pay | Admitting: Nurse Practitioner

## 2020-03-27 DIAGNOSIS — I480 Paroxysmal atrial fibrillation: Secondary | ICD-10-CM

## 2020-03-27 DIAGNOSIS — K219 Gastro-esophageal reflux disease without esophagitis: Secondary | ICD-10-CM

## 2020-03-27 DIAGNOSIS — N3941 Urge incontinence: Secondary | ICD-10-CM | POA: Diagnosis not present

## 2020-03-27 DIAGNOSIS — G20 Parkinson's disease: Secondary | ICD-10-CM

## 2020-03-27 DIAGNOSIS — K5900 Constipation, unspecified: Secondary | ICD-10-CM

## 2020-03-27 DIAGNOSIS — M159 Polyosteoarthritis, unspecified: Secondary | ICD-10-CM | POA: Diagnosis not present

## 2020-03-27 DIAGNOSIS — D500 Iron deficiency anemia secondary to blood loss (chronic): Secondary | ICD-10-CM

## 2020-03-27 DIAGNOSIS — I872 Venous insufficiency (chronic) (peripheral): Secondary | ICD-10-CM | POA: Diagnosis not present

## 2020-03-27 DIAGNOSIS — G903 Multi-system degeneration of the autonomic nervous system: Secondary | ICD-10-CM

## 2020-03-27 NOTE — Assessment & Plan Note (Signed)
Stable, continue MIraLax.

## 2020-03-27 NOTE — Assessment & Plan Note (Signed)
takes Mirabegron.  

## 2020-03-27 NOTE — Assessment & Plan Note (Signed)
Multiple sites, in general, continuej Tylenol, Methocarbamol

## 2020-03-27 NOTE — Progress Notes (Signed)
Location:   [place] Room Number: 13 Place of Service:  SNF (31) Provider:  Marda Stalker, Lennie Odor NP  [name], MD  Patient Care Team: [name], MD as PCP - General (Internal Medicine) [name], [name], MD as PCP - Cardiology (Cardiology) [name], [name], DO as Consulting Physician (Neurology) Tajee Savant X, NP as Nurse Practitioner (Internal Medicine)  Extended Emergency Contact Information Primary Emergency Contact: [name], [name] Mobile Phone: [phone] Relation: Daughter Interpreter needed? No Secondary Emergency Contact: [name] Address: [address]          [place],  [address] [name] Phone: [phone] Mobile Phone: [phone] Relation: Son  Code Status:  DNR Managed Care Goals of care: Advanced Directive information Advanced Directives 03/27/2020  Does Patient Have a Medical Advance Directive? Yes  Type of Paramedic of [place];Out of facility DNR (pink MOST or yellow form)  Does patient want to make changes to medical advance directive? -  Copy of [place] in Chart? -  Would patient like information on creating a medical advance directive? -  Pre-existing out of facility DNR order (yellow form or pink MOST form) -     Chief Complaint  Patient presents with   Medical Management of Chronic Issues    HPI:  Pt is a 80 y.o. female seen today for medical management of chronic diseases.    The patient has chronic edema BLE.Off Metolazone 09/27/19.  Afib, heart rate is in control, on Xarelto 29m qd, off Diltiazem. Hgb 11.5 03/23/20. TSH 2.67 03/23/20 R hip pain, on Tylenol 10076mtidprn, Methocarbamol 25066md.  Parkinson's, symptoms worse in pm, didn't tolerated higher dose of Sinemet[facility], but little symptomatic.  Anemia, stable, Hgb  11.5 03/23/20, takes Fe GERD, stable, on Omeprazole 51m34m. Urge incontinent of urine, takes Mirabegron. Constipation, takes MiraLax   Past Medical History:  Diagnosis Date   Acute lower UTI 09/14/2018   Anemia 10/15/2018   2016 colonoscopy 10/14/18 wbc 5.4, Hgb 10.1, plt 184 12/01/18 wbc 4.5, Hgb 11.1, plt 188, neutrophils 63,  Na 139, K 3.7, Bun 23, creat 0.69, eGFR 83 on Fe, Hgb 11.7 12/09/18    Atrial fibrillation (HCC)[place]1/2012   10/07/18 Na 143, K 4.0, Bun 20, creat 0.79, eGFR 72, wbc 7.1, Hgb 10.9, plt 172 10/28/18 Na 142, K 4.0, Bun 19, creat 0.77, eGFR 74 11/18/18 Na 144, K 4.1, Bun 21, creat 0.69, eGFR 83   Bilateral lower extremity edema 07/19/2018   11/02/18 BMP 2 weeks.    Cervical pain (neck) 06/20/2010   Closed fracture of part of upper end of humerus 05/01/2015   Colles' fracture of right radius 03/05/2015   Constipation 07/19/2018   DEGENERATIVE JOINT DISEASE, RIGHT HIP 02/16/2007   Dupuytren's contracture    Dysuria 09/20/2018   09/20/18 c/o got up several times last night to go urinate, burning on urination, lower abd /back discomfort, but urinary frequency, leakage are not new. UA C/S, Pyridium 100mg78m x 2 days.  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   S/P breast biopsy, left    two o'clock position - benign   Toxic effect of venom(989.5) 07/27/2007   Tremor, unspecified 10/19/2015   Unstable gait 02/16/2017   Vaginal atrophy 10/19/2015   Venous insufficiency     Weakness 09/12/2018   Past Surgical History:  Procedure Laterality Date   BREAST BIOPSY Left    CATARACT EXTRACTION, BILATERAL      Allergies  Allergen Reactions   Doxycycline Nausea And Vomiting   Sulfonamide Derivatives     REACTION: HIVES    Allergies as of 03/27/2020      Reactions   Doxycycline Nausea And Vomiting   Sulfonamide Derivatives    REACTION: HIVES      Medication List       Accurate as of March 27, 2020 11:59 PM. If you have any questions, ask your nurse or doctor.        acetaminophen 500 MG tablet Commonly known as: TYLENOL Take 1,000 mg by mouth 3 (three) times daily as needed.   calcium carbonate 750 MG chewable tablet Commonly known as: TUMS EX Chew 1 tablet by mouth daily. Between the hours of 7am-10am.   carbidopa-levodopa 25-100 MG tablet Commonly known as: SINEMET IR Take 1.5 tablets by mouth in the morning, at noon, in the evening, and at bedtime.   carbidopa-levodopa 50-200 MG tablet Commonly known as: SINEMET CR Take 1 tablet by mouth in the morning, at noon, and at bedtime. 6.30 am 2 and 10 pm   ferrous sulfate 325 (65 FE) MG tablet Take 325 mg by mouth every Monday, Wednesday, and Friday. Give with food   hydrocortisone 2.5 % ointment Apply 1 application topically at bedtime as needed.   methocarbamol 500 MG tablet Commonly known as: ROBAXIN Take 250 mg by mouth daily. As needed   mirabegron ER 50 MG Tb24 tablet Commonly known as: MYRBETRIQ Take 50 mg by mouth daily. Between the hours of 7am-10am.   omeprazole 20 MG capsule Commonly known as: PRILOSEC Take 20 mg by mouth daily.   polyethylene glycol 17 g packet Commonly known as: MIRALAX / GLYCOLAX Take 17 g by mouth daily.   PreviDent 5000 Booster Plus 1.1 % Pste Generic drug: Sodium Fluoride Place 1 application onto teeth in the morning and at bedtime.   rivaroxaban 20 MG Tabs tablet Commonly known as: XARELTO Take 20 mg by mouth every evening. Between the hours  of 5pm-6pm.   Vitamin D3 10 MCG (400 UNIT) Caps Take 1 capsule by mouth daily. Between the hours of 7am-10am.   zinc oxide 20 % ointment Apply 1 application topically as needed for irritation. To buttocks after every incontinent episode and as needed for redness. May keep at bedside.       Review of Systems  Constitutional: Positive for fatigue. Negative for fever and unexpected weight change.       Fatigue in pm  HENT: Positive for hearing loss. Negative for congestion and voice change.   Eyes: Negative for visual disturbance.  Respiratory: Negative for cough and shortness of breath.   Cardiovascular: Positive for leg swelling.  Gastrointestinal: Negative for abdominal pain and constipation.  Genitourinary: Positive for frequency and urgency. Negative for dysuria and hematuria.  Musculoskeletal: Positive for arthralgias, back pain and gait problem. Negative for myalgias.       Right hip pain is well controlled. Lower back  discomfort positional.   Skin: Negative for color change.  Neurological: Positive for tremors. Negative for speech difficulty, light-headedness and headaches.       Moves slow, fine tremor in fingers, burning sensation  in the R+ L great toes when touched by sheets, comes/goes  Psychiatric/Behavioral: Negative for behavioral problems and sleep disturbance. The patient is nervous/anxious.        Sleeps from 9pm to 4-5am usually. Feels anxious since 2pm sinemet    Immunization History  Administered Date(s) Administered   Influenza Split 10/20/2012   Influenza Whole 10/21/2006   Influenza, High Dose Seasonal PF 10/17/2016, 11/02/2019   Influenza-Unspecified 10/20/2013, 10/22/2017   Moderna Sars-Covid-2 Vaccination 01/22/2019, 02/19/2019, 11/29/2019   Pneumococcal Conjugate-13 01/31/2013   Pneumococcal Polysaccharide-23 01/20/2005, 11/24/2011   Td 01/21/2004   Tdap 03/16/2014   Zoster 04/17/2009   Pertinent  Health Maintenance Due  Topic Date Due    INFLUENZA VACCINE  Completed   DEXA SCAN  Completed   PNA vac Low Risk Adult  Completed   COLONOSCOPY (Pts 45-41yr Insurance coverage will need to be confirmed)  Discontinued   Fall Risk  05/03/2018 11/03/2017 08/04/2017 03/06/2017 11/26/2016  Falls in the past year? 1 No No Yes Yes  Number falls in past yr: 0 - - 2 or more 2 or more  Injury with Fall? 0 - - No No  Risk Factor Category  - - - High Fall Risk High Fall Risk  Risk for fall due to : Other (Comment) - - - -  Follow up Falls evaluation completed;Education provided;Falls prevention discussed - - Falls evaluation completed Falls evaluation completed   Functional Status Survey:    Vitals:   03/27/20 1028  BP: (!) 94/54  Pulse: 82  Resp: 18  Temp: 97.6 F (36.4 C)  SpO2: 96%  Weight: 138 lb 1.6 oz (62.6 kg)  Height: '5\' 8"'  (1.727 m)   Body mass index is 21 kg/m. Physical Exam Vitals and nursing note reviewed.  Constitutional:      Appearance: Normal appearance.  HENT:     Head: Normocephalic and atraumatic.     Mouth/Throat:     Mouth: Mucous membranes are moist.  Eyes:     Extraocular Movements: Extraocular movements intact.     Conjunctiva/sclera: Conjunctivae normal.     Pupils: Pupils are equal, round, and reactive to light.  Cardiovascular:     Rate and Rhythm: Normal rate. Rhythm irregular.     Heart sounds: No murmur heard.     Comments: DP pulses present L>R Pulmonary:     Effort: Pulmonary effort is normal.     Breath sounds: No rales.  Abdominal:     General: Bowel sounds are normal.     Palpations: Abdomen is soft.  Musculoskeletal:     Cervical back: Normal range of motion and neck supple.     Right lower leg: Edema present.     Left lower leg: Edema present.     Comments: trace edema BLE. No decreased ROM of the left wrist.   Skin:    General: Skin is warm and dry.  Neurological:     General: No focal deficit present.     Mental Status: She is alert and oriented to person, place, and  time. Mental status is at baseline.     Coordination: Coordination abnormal.     Gait: Gait abnormal.     Comments: Moves slow, tremor in hands  Psychiatric:        Mood and  Affect: Mood normal.        Behavior: Behavior normal.        Thought Content: Thought content normal.     Labs reviewed: Recent Labs    07/19/19 0000 09/06/19 0000 10/11/19 0000 03/23/20 0000  NA 138 139 141 141  K 4.1 4.0 4.1 4.5  CL 104 103 107 107  CO2 29* 29* 27* 28*  BUN '20 16 20 17  ' CREATININE 0.7 0.8 0.8 0.8  CALCIUM 8.9  --  9.4 9.2   Recent Labs    04/01/19 0000 09/06/19 0000 03/23/20 0000  AST 12* 9* 9*  ALT  --  3* 3*  ALKPHOS 48 62 58  ALBUMIN 3.8 3.8 3.5   Recent Labs    09/06/19 0000 10/11/19 0000 03/23/20 0000  WBC 4.9 4.2 4.5  NEUTROABS 3,450  --  2,934.00  HGB 11.7* 12.1 11.5*  HCT 36 37 36  PLT 181 157 164   Lab Results  Component Value Date   TSH 2.67 03/23/2020   No results found for: HGBA1C Lab Results  Component Value Date   CHOL 185 03/24/2019   HDL 79 (A) 03/24/2019   LDLCALC 94 03/24/2019   LDLDIRECT 104.2 01/25/2013   TRIG 41 03/24/2019   CHOLHDL 2 08/29/2015    Significant Diagnostic Results in last 30 days:  No results found.  Assessment/Plan Anemia stable, Hgb 11.5 03/23/20, takes Fe   GERD (gastroesophageal reflux disease) stable, on Omeprazole 59m qd.   Urge incontinence of urine takes Mirabegron.   Constipation Stable, continue MIraLax.   Hypotension  persists, but little symptomatic.    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 Hgb 11.5 03/23/20. TSH 2.67 03/23/20   Edema of left lower extremity due to peripheral venous insufficiency Off  diuretics    Family/ staff Communication: plan of care reviewed with the patient and charge nurse.   Labs/tests ordered:  none  Time spend 35 minutes

## 2020-03-27 NOTE — Assessment & Plan Note (Signed)
stable, on Omeprazole 20mg qd. 

## 2020-03-27 NOTE — Assessment & Plan Note (Signed)
Off diuretics

## 2020-03-27 NOTE — Assessment & Plan Note (Signed)
persists, but little symptomatic.

## 2020-03-27 NOTE — Assessment & Plan Note (Signed)
heart rate is in control, on Xarelto 20mg  qd, off Diltiazem. Hgb 11.5 03/23/20. TSH 2.67 03/23/20

## 2020-03-27 NOTE — Assessment & Plan Note (Signed)
stable, Hgb 11.5 03/23/20, takes Fe

## 2020-03-27 NOTE — Assessment & Plan Note (Signed)
symptoms worse in pm, didn't tolerated higher dose of Sinemet, f/u neurolog at [facility].

## 2020-03-29 DIAGNOSIS — I10 Essential (primary) hypertension: Secondary | ICD-10-CM | POA: Diagnosis not present

## 2020-03-29 DIAGNOSIS — I482 Chronic atrial fibrillation, unspecified: Secondary | ICD-10-CM | POA: Diagnosis not present

## 2020-03-29 LAB — HEPATIC FUNCTION PANEL
ALT: 7 (ref 7–35)
AST: 10 — AB (ref 13–35)
Alkaline Phosphatase: 61 (ref 25–125)
Bilirubin, Total: 0.8

## 2020-03-29 LAB — TSH: TSH: 1.3 (ref 0.41–5.90)

## 2020-03-29 LAB — BASIC METABOLIC PANEL
BUN: 17 (ref 4–21)
CO2: 23 — AB (ref 13–22)
Chloride: 107 (ref 99–108)
Creatinine: 0.8 (ref 0.5–1.1)
Glucose: 80
Potassium: 4.5 (ref 3.4–5.3)
Sodium: 141 (ref 137–147)

## 2020-03-29 LAB — COMPREHENSIVE METABOLIC PANEL
Albumin: 3.8 (ref 3.5–5.0)
Calcium: 9.4 (ref 8.7–10.7)
Globulin: 2.5

## 2020-03-29 LAB — CBC AND DIFFERENTIAL
HCT: 37 (ref 36–46)
Hemoglobin: 12.1 (ref 12.0–16.0)
Platelets: 185 (ref 150–399)
WBC: 7.5

## 2020-03-29 LAB — CBC: RBC: 3.94 (ref 3.87–5.11)

## 2020-03-30 ENCOUNTER — Encounter: Payer: Self-pay | Admitting: Nurse Practitioner

## 2020-04-04 ENCOUNTER — Encounter: Payer: Self-pay | Admitting: Internal Medicine

## 2020-04-09 DIAGNOSIS — G20 Parkinson's disease: Secondary | ICD-10-CM | POA: Diagnosis not present

## 2020-04-09 DIAGNOSIS — I951 Orthostatic hypotension: Secondary | ICD-10-CM | POA: Diagnosis not present

## 2020-04-09 DIAGNOSIS — Z79899 Other long term (current) drug therapy: Secondary | ICD-10-CM | POA: Diagnosis not present

## 2020-04-25 ENCOUNTER — Encounter: Payer: Self-pay | Admitting: Internal Medicine

## 2020-05-01 ENCOUNTER — Encounter: Payer: Self-pay | Admitting: Internal Medicine

## 2020-05-01 ENCOUNTER — Non-Acute Institutional Stay (SKILLED_NURSING_FACILITY): Payer: Medicare PPO | Admitting: Internal Medicine

## 2020-05-01 DIAGNOSIS — K5900 Constipation, unspecified: Secondary | ICD-10-CM | POA: Diagnosis not present

## 2020-05-01 DIAGNOSIS — K219 Gastro-esophageal reflux disease without esophagitis: Secondary | ICD-10-CM

## 2020-05-01 DIAGNOSIS — N3941 Urge incontinence: Secondary | ICD-10-CM | POA: Diagnosis not present

## 2020-05-01 DIAGNOSIS — I480 Paroxysmal atrial fibrillation: Secondary | ICD-10-CM

## 2020-05-01 DIAGNOSIS — D500 Iron deficiency anemia secondary to blood loss (chronic): Secondary | ICD-10-CM

## 2020-05-01 DIAGNOSIS — G20 Parkinson's disease: Secondary | ICD-10-CM

## 2020-05-01 DIAGNOSIS — G903 Multi-system degeneration of the autonomic nervous system: Secondary | ICD-10-CM | POA: Diagnosis not present

## 2020-05-01 NOTE — Progress Notes (Signed)
Location:   [facility] Room Number: [place] of Service:  SNF [id number]) Provider:  Veleta Miners MD  [name], MD  Patient Care Team: [name], MD as PCP - General (Internal Medicine) [name], [name], MD as PCP - Cardiology (Cardiology) [name], [name], DO as Consulting Physician (Neurology) Mast, Man X, NP as Nurse Practitioner (Internal Medicine)  Extended Emergency Contact Information Primary Emergency Contact: Clotilda, Hafer Mobile Phone: [phone] Relation: Daughter Interpreter needed? No Secondary Emergency Contact: [facility] Address: [address]          [place], [place] [address] [name] of [place] Phone: [phone] Mobile Phone: [phone] Relation: Son  Code Status:  DNR Managed Care Goals of care: Advanced Directive information Advanced Directives 03/27/2020  Does Patient Have a Medical Advance Directive? Yes  Type of Paramedic of [place];Out of facility DNR (pink MOST or yellow form)  Does patient want to make changes to medical advance directive? -  Copy of [place] in Chart? -  Would patient like information on creating a medical advance directive? -  Pre-existing out of facility DNR order (yellow form or pink MOST form) -     Chief Complaint  Patient presents with  . Acute Visit    Follow up     HPI:  Pt is a 80 y.o. female seen today for an acute visit for Follow up as requested by Daughter  Patient has a history of Parkinson disease diagnosed 2 years ago on Sinemet and follows withNeurology, paroxysmal A. fib on Xarelto,  hyperlipidemia, H/o Ovarian Cyst, Lower extremity edema,2 D echo with N EF Biatrial enlargement Urinary Incontinence HadChronic Right Hip Pain. MRI showed Moderate Right Hip DJD with High Grade Partial Thickness Tear of IlioPsoas Tendon  Patient was recently seen by movement disorder clinic in [facility] As Sinemet was  changed to 2 tablets 5 times a day with extended release at night.  Also started on midodrine for her blood pressure Patient said that she initially did well with the change.  But now she is struggling with the same issues that she was before the change which includes slump and fatigue after 1 hour of taking her Sinemet.  And since her ER was stopped in the morning she says her mornings are not that good either.  Continues to do some restorative therapy.  Walks for 10 to 15 minutes but causes a lot of fatigue. Her blood pressure is doing better on midodrine.    Past Medical History:  Diagnosis Date  . Acute lower UTI 09/14/2018  . Anemia 10/15/2018   2016 colonoscopy 10/14/18 wbc 5.4, Hgb 10.1, plt 184 12/01/18 wbc 4.5, Hgb 11.1, plt 188, neutrophils 63,  Na 139, K 3.7, Bun 23, creat 0.69, eGFR 83 on Fe, Hgb 11.7 12/09/18   . Atrial fibrillation ([place]) 05/21/2010   10/07/18 Na 143, K 4.0, Bun 20, creat 0.79, eGFR 72, wbc 7.1, Hgb 10.9, plt 172 10/28/18 Na 142, K 4.0, Bun 19, creat 0.77, eGFR 74 11/18/18 Na 144, K 4.1, Bun 21, creat 0.69, eGFR 83  . Bilateral lower extremity edema 07/19/2018   11/02/18 BMP 2 weeks.   . Cervical pain (neck) 06/20/2010  . Closed fracture of part of upper end of humerus 05/01/2015  . Colles' fracture of right radius 03/05/2015  . Constipation 07/19/2018  . DEGENERATIVE JOINT DISEASE, RIGHT HIP 02/16/2007  . Dupuytren's contracture   . Dysuria 09/20/2018   09/20/18  c/o got up several times last night to go urinate, burning on urination, lower abd /back discomfort, but urinary frequency, leakage are not new. UA C/S, Pyridium 161m tid x 2 days.  09/21/18 wbc 6.1, Hgb 11.8, plt 210, neutrophil 69.4, Na 142, K 4.1, Bun 19, creat 0.78, TP 6.4, albumin 3.9  . Hematuria 02/04/2014  . Hypotension 09/18/2018  . Long term (current) use of anticoagulants 05/29/2016  . Osteoarthritis of hip    right  . Osteoarthritis of left knee   . Overactive bladder 10/06/2018  . Parkinson disease ([place]    . Paroxysmal A-fib ([place]   . POSTMENOPAUSAL SYNDROME 02/16/2007  . PREMATURE ATRIAL CONTRACTIONS 02/16/2007  . 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 S/P breast biopsy, left    two o'clock position - benign  . 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 Tremor, unspecified 10/19/2015  . Unstable gait 02/16/2017  . Vaginal atrophy 10/19/2015  . Venous insufficiency   . Weakness 09/12/2018   Past Surgical History:  Procedure Laterality Date  . BREAST BIOPSY Left   . CATARACT EXTRACTION, BILATERAL      Allergies  Allergen Reactions  . Doxycycline Nausea And Vomiting  . Sulfonamide Derivatives     REACTION: HIVES    Allergies as of 05/01/2020      Reactions   Doxycycline Nausea And Vomiting   Sulfonamide Derivatives    REACTION: HIVES      Medication List       Accurate as of May 01, 2020 10:34 AM. If you have any questions, ask your nurse or doctor.        acetaminophen 500 MG tablet Commonly known as: TYLENOL Take 1,000 mg by mouth 3 (three) times daily as needed.   calcium carbonate 750 MG chewable tablet Commonly known as: TUMS EX Chew 1 tablet by mouth daily. Between the hours of 7am-10am.   carbidopa-levodopa 25-100 MG tablet Commonly known as: SINEMET IR Take 2 tablets by mouth 5 (five) times daily.   carbidopa-levodopa 50-200 MG tablet Commonly known as: SINEMET CR Take 1 tablet by mouth at bedtime. 6.30 am 2 and 10 pm   ferrous sulfate 325 (65 FE) MG tablet Take 325 mg by mouth every Monday, Wednesday, and Friday. Give with food   hydrocortisone 2.5 % ointment Apply 1 application topically at bedtime as needed.   methocarbamol 500 MG tablet Commonly known as: ROBAXIN Take 250 mg by mouth daily. As needed   midodrine 5 MG tablet Commonly known as: PROAMATINE Take 5 mg by mouth 3 (three) times daily with meals.   mirabegron ER 50 MG Tb24 tablet Commonly known as: MYRBETRIQ Take 50 mg by mouth daily. Between the hours of 7am-10am.    omeprazole 20 MG capsule Commonly known as: PRILOSEC Take 20 mg by mouth daily.   polyethylene glycol 17 g packet Commonly known as: MIRALAX / GLYCOLAX Take 17 g by mouth daily.   PreviDent 5000 Booster Plus 1.1 % Pste Generic drug: Sodium Fluoride Place 1 application onto teeth in the morning and at bedtime.   rivaroxaban 20 MG Tabs tablet Commonly known as: XARELTO Take 20 mg by mouth every evening. Between the hours of 5pm-6pm.   Vitamin D3 10 MCG (400 UNIT) Caps Take 1 capsule by mouth daily. Between the hours of 7am-10am.   zinc oxide 20 % ointment Apply 1 application topically as needed for irritation. To buttocks after every incontinent episode and as needed for redness. May keep at bedside.  Review of Systems  Constitutional: Positive for activity change.  HENT: Negative.   Respiratory: Negative.   Cardiovascular: Positive for leg swelling.  Gastrointestinal: Positive for constipation.  Genitourinary: Negative.   Musculoskeletal: Positive for gait problem.  Skin: Negative.   Neurological: Positive for weakness.  Psychiatric/Behavioral: Negative.     Immunization History  Administered Date(s) Administered  . Influenza Split 10/20/2012  . Influenza Whole 10/21/2006  . Influenza, High Dose Seasonal PF 10/17/2016, 11/02/2019  . Influenza-Unspecified 10/20/2013, 10/22/2017  . Moderna Sars-Covid-2 Vaccination 01/22/2019, 02/19/2019, 11/29/2019  . Pneumococcal Conjugate-13 01/31/2013  . Pneumococcal Polysaccharide-23 01/20/2005, 11/24/2011  . Td 01/21/2004  . Tdap 03/16/2014  . Zoster 04/17/2009   Pertinent  Health Maintenance Due  Topic Date Due  . INFLUENZA VACCINE  08/20/2020  . DEXA SCAN  Completed  . PNA vac Low Risk Adult  Completed  . COLONOSCOPY (Pts 45-19yr Insurance coverage will need to be confirmed)  Discontinued   Fall Risk  05/03/2018 11/03/2017 08/04/2017 03/06/2017 11/26/2016  Falls in the past year? 1 No No Yes Yes  Number falls in  past yr: 0 - - 2 or more 2 or more  Injury with Fall? 0 - - No No  Risk Factor Category  - - - High Fall Risk High Fall Risk  Risk for fall due to : Other (Comment) - - - -  Follow up Falls evaluation completed;Education provided;Falls prevention discussed - - Falls evaluation completed Falls evaluation completed   Functional Status Survey:    Vitals:   05/01/20 0943  BP: (!) 124/92  Pulse: 73  Resp: 18  Temp: 98.1 F (36.7 C)  SpO2: 95%  Weight: 135 lb 12.8 oz (61.6 kg)  Height: '5\' 8"'  (1.727 m)   Body mass index is 20.65 kg/m. Physical Exam Vitals reviewed.  Constitutional:      Appearance: Normal appearance.  HENT:     Head: Normocephalic.     Nose: Nose normal.     Mouth/Throat:     Mouth: Mucous membranes are moist.     Pharynx: Oropharynx is clear.  Eyes:     Pupils: Pupils are equal, round, and reactive to light.  Cardiovascular:     Rate and Rhythm: Normal rate. Rhythm irregular.     Pulses: Normal pulses.  Pulmonary:     Effort: Pulmonary effort is normal.     Breath sounds: Normal breath sounds.  Abdominal:     General: Abdomen is flat. Bowel sounds are normal.     Palpations: Abdomen is soft.  Musculoskeletal:        General: Swelling present.     Cervical back: Neck supple.  Skin:    General: Skin is warm.  Neurological:     General: No focal deficit present.     Mental Status: She is alert and oriented to person, place, and time.  Psychiatric:        Mood and Affect: Mood normal.        Thought Content: Thought content normal.     Labs reviewed: Recent Labs    10/11/19 0000 03/23/20 0000 03/29/20 0000  NA 141 141 141  K 4.1 4.5 4.5  CL 107 107 107  CO2 27* 28* 23*  BUN '20 17 17  ' CREATININE 0.8 0.8 0.8  CALCIUM 9.4 9.2 9.4   Recent Labs    09/06/19 0000 03/23/20 0000 03/29/20 0000  AST 9* 9* 10*  ALT 3* 3* 7  ALKPHOS 62 58 61  ALBUMIN 3.8 3.5 3.8  Recent Labs    09/06/19 0000 10/11/19 0000 03/23/20 0000 03/29/20 0000   WBC 4.9 4.2 4.5 7.5  NEUTROABS 3,450  --  2,934.00  --   HGB 11.7* 12.1 11.5* 12.1  HCT 36 37 36 37  PLT 181 157 164 185   Lab Results  Component Value Date   TSH 1.30 03/29/2020   No results found for: HGBA1C Lab Results  Component Value Date   CHOL 185 03/24/2019   HDL 79 (A) 03/24/2019   LDLCALC 94 03/24/2019   LDLDIRECT 104.2 01/25/2013   TRIG 41 03/24/2019   CHOLHDL 2 08/29/2015    Significant Diagnostic Results in last 30 days:  No results found.  Assessment/Plan Parkinsonism, unspecified Parkinsonism type ([place]) D/W the patient and have send message to daughter also Patient feels her mornings are worse on present dosing which is Sinemet IR 5/day At this time will continue same dose of Sinemet till she goes for her appointments with Neurology in [place] says she did make progress before but is now at  Her baseline again. Continues to need help with her ADLS  Paroxysmal atrial fibrillation (HCC) Doing well on Xarelto  Off Cardizem due to Low BP Iron deficiency anemia due to chronic blood loss On Iron Gastroesophageal reflux disease, unspecified whether esophagitis present On Prilosec Urge incontinence of urine On Myrebetriq Constipation, unspecified constipation type Miralax Neurogenic orthostatic hypotension (HCC) Was started on Midodrine by Neurology and her BP is doing much better Edema of left lower extremity due to peripheral venous insufficiency Maintaining with Ted hose Taken off diuretics due to Low BP Family/ staff Communication:   Labs/tests ordered:   Total time spent in this patient care encounter was  45_  minutes; greater than 50% of the visit spent counseling patient and staff, reviewing records , Labs and coordinating care for problems addressed at this encounter.

## 2020-05-03 DIAGNOSIS — L814 Other melanin hyperpigmentation: Secondary | ICD-10-CM | POA: Diagnosis not present

## 2020-05-18 ENCOUNTER — Encounter: Payer: Self-pay | Admitting: Nurse Practitioner

## 2020-05-18 ENCOUNTER — Non-Acute Institutional Stay (SKILLED_NURSING_FACILITY): Payer: Medicare PPO | Admitting: Nurse Practitioner

## 2020-05-18 DIAGNOSIS — D500 Iron deficiency anemia secondary to blood loss (chronic): Secondary | ICD-10-CM

## 2020-05-18 DIAGNOSIS — K219 Gastro-esophageal reflux disease without esophagitis: Secondary | ICD-10-CM | POA: Diagnosis not present

## 2020-05-18 DIAGNOSIS — G20 Parkinson's disease: Secondary | ICD-10-CM

## 2020-05-18 DIAGNOSIS — N3941 Urge incontinence: Secondary | ICD-10-CM | POA: Diagnosis not present

## 2020-05-18 DIAGNOSIS — M159 Polyosteoarthritis, unspecified: Secondary | ICD-10-CM

## 2020-05-18 DIAGNOSIS — I872 Venous insufficiency (chronic) (peripheral): Secondary | ICD-10-CM | POA: Diagnosis not present

## 2020-05-18 DIAGNOSIS — G903 Multi-system degeneration of the autonomic nervous system: Secondary | ICD-10-CM | POA: Diagnosis not present

## 2020-05-18 DIAGNOSIS — K5900 Constipation, unspecified: Secondary | ICD-10-CM | POA: Diagnosis not present

## 2020-05-18 DIAGNOSIS — I480 Paroxysmal atrial fibrillation: Secondary | ICD-10-CM | POA: Diagnosis not present

## 2020-05-18 DIAGNOSIS — G20C Parkinsonism, unspecified: Secondary | ICD-10-CM

## 2020-05-18 NOTE — Progress Notes (Signed)
Location:   SNF [place] Room Number: 13 Place of Service:  SNF (31) Provider: [facility] Nadene Witherspoon NP  [name], MD  Patient Care Team: [name], MD as PCP - General (Internal Medicine) [name], [name], MD as PCP - Cardiology (Cardiology) [name], [name], DO as Consulting Physician (Neurology) Kairon Shock X, NP as Nurse Practitioner (Internal Medicine)  Extended Emergency Contact Information Primary Emergency Contact: Lacinda, Curvin Mobile Phone: [phone] Relation: Daughter Interpreter needed? No Secondary Emergency Contact: [facility] Address: [address]          [place], [place] [address] [name] of [place] Phone: [phone] Mobile Phone: [phone] Relation: Son  Code Status:  DNR Goals of care: Advanced Directive information Advanced Directives 03/27/2020  Does Patient Have a Medical Advance Directive? Yes  Type of Paramedic of [place];Out of facility DNR (pink MOST or yellow form)  Does patient want to make changes to medical advance directive? -  Copy of [place] in Chart? -  Would patient like information on creating a medical advance directive? -  Pre-existing out of facility DNR order (yellow form or pink MOST form) -     Chief Complaint  Patient presents with  . Medical Management of Chronic Issues    HPI:  Pt is a 80 y.o. female seen today for medical management of chronic diseases.      The patient has chronic edema BLE.Off Metolazone 09/27/19.  Afib, heart rate is in control, on Xarelto 79m qd,offDiltiazem. Hgb 12.1 03/29/20. TSH 1.30 03/29/20 R hip pain, on Tylenol 10029mtidprn, Methocarbamol 25040md.  Parkinson's, symptoms worse in am instead of pm,  Takes  Sinemet 6x/day[facility], takes Midodrine.  Anemia, stable, Hgb 12.1 03/29/20, takes  Fe GERD, stable, on Omeprazole 40m15m. Urge incontinent of urine, takes Mirabegron. Constipation, takes MiraLax   Past Medical History:  Diagnosis Date  . Acute lower UTI 09/14/2018  . Anemia 10/15/2018   2016 colonoscopy 10/14/18 wbc 5.4, Hgb 10.1, plt 184 12/01/18 wbc 4.5, Hgb 11.1, plt 188, neutrophils 63,  Na 139, K 3.7, Bun 23, creat 0.69, eGFR 83 on Fe, Hgb 11.7 12/09/18   . Atrial fibrillation (HCC)[place]1/2012   10/07/18 Na 143, K 4.0, Bun 20, creat 0.79, eGFR 72, wbc 7.1, Hgb 10.9, plt 172 10/28/18 Na 142, K 4.0, Bun 19, creat 0.77, eGFR 74 11/18/18 Na 144, K 4.1, Bun 21, creat 0.69, eGFR 83  . Bilateral lower extremity edema 07/19/2018   11/02/18 BMP 2 weeks.   . Cervical pain (neck) 06/20/2010  . Closed fracture of part of upper end of humerus 05/01/2015  . Colles' fracture of right radius 03/05/2015  . Constipation 07/19/2018  . DEGENERATIVE JOINT DISEASE, RIGHT HIP 02/16/2007  . Dupuytren's contracture   . Dysuria 09/20/2018   09/20/18 c/o got up several times last night to go urinate, burning on urination, lower abd /back discomfort, but urinary frequency, leakage are not new. UA C/S, Pyridium 100mg54m x 2 days.  09/21/18 wbc 6.1, Hgb 11.8, plt 210, neutrophil 69.4, Na 142, K 4.1, Bun 19, creat 0.78, TP 6.4, albumin 3.9  . Hematuria 02/04/2014  . Hypotension 09/18/2018  . Long term (current) use of anticoagulants 05/29/2016  . Osteoarthritis of hip    right  . Osteoarthritis of left knee   . Overactive bladder 10/06/2018  . Parkinson disease (HCC) [place] Paroxysmal A-fib (HCC) [place]  POSTMENOPAUSAL SYNDROME 02/16/2007  . PREMATURE ATRIAL CONTRACTIONS 02/16/2007  . 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 S/P breast biopsy, left    two o'clock position - benign  . 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 Tremor, unspecified 10/19/2015  . Unstable gait 02/16/2017  . Vaginal atrophy 10/19/2015  . Venous insufficiency   . Weakness 09/12/2018    Past Surgical History:  Procedure Laterality Date  . BREAST BIOPSY Left   . CATARACT EXTRACTION, BILATERAL      Allergies  Allergen Reactions  . Doxycycline Nausea And Vomiting  . Sulfonamide Derivatives     REACTION: HIVES    Allergies as of 05/18/2020      Reactions   Doxycycline Nausea And Vomiting   Sulfonamide Derivatives    REACTION: HIVES      Medication List       Accurate as of May 18, 2020 11:59 PM. If you have any questions, ask your nurse or doctor.        acetaminophen 500 MG tablet Commonly known as: TYLENOL Take 1,000 mg by mouth 3 (three) times daily as needed.   calcium carbonate 750 MG chewable tablet Commonly known as: TUMS EX Chew 1 tablet by mouth daily. Between the hours of 7am-10am.   carbidopa-levodopa 25-100 MG tablet Commonly known as: SINEMET IR Take 2 tablets by mouth 5 (five) times daily.   carbidopa-levodopa 50-200 MG tablet Commonly known as: SINEMET CR Take 1 tablet by mouth at bedtime. 6.30 am 2 and 10 pm   ferrous sulfate 325 (65 FE) MG tablet Take 325 mg by mouth every Monday, Wednesday, and Friday. Give with food   hydrocortisone 2.5 % ointment Apply 1 application topically at bedtime as needed.   methocarbamol 500 MG tablet Commonly known as: ROBAXIN Take 250 mg by mouth daily. As needed   midodrine 5 MG tablet Commonly known as: PROAMATINE Take 5 mg by mouth 3 (three) times daily with meals.   mirabegron ER 50 MG Tb24 tablet Commonly known as: MYRBETRIQ Take 50 mg by mouth daily. Between the hours of 7am-10am.   omeprazole 20 MG capsule Commonly known as: PRILOSEC Take 20 mg by mouth daily.   polyethylene glycol 17 g packet Commonly known as: MIRALAX / GLYCOLAX Take 17 g by mouth daily.   PreviDent 5000 Booster Plus 1.1 % Pste Generic drug: Sodium Fluoride Place 1 application onto teeth in the morning and at bedtime.   rivaroxaban 20 MG Tabs tablet Commonly known as: XARELTO Take 20 mg by mouth every  evening. Between the hours of 5pm-6pm.   Vitamin D3 10 MCG (400 UNIT) Caps Take 1 capsule by mouth daily. Between the hours of 7am-10am.   zinc oxide 20 % ointment Apply 1 application topically as needed for irritation. To buttocks after every incontinent episode and as needed for redness. May keep at bedside.       Review of Systems  Constitutional: Negative for fatigue, fever and unexpected weight change.       Fatigue in am  HENT: Positive for hearing loss. Negative for congestion and voice change.   Eyes: Negative for visual disturbance.  Respiratory: Negative for cough and shortness of breath.   Cardiovascular: Positive for leg swelling.  Gastrointestinal: Negative for abdominal pain and constipation.  Genitourinary: Positive for frequency and urgency. Negative for dysuria and hematuria.  Musculoskeletal: Positive for arthralgias, back pain and gait problem. Negative for myalgias.       Right hip pain is well controlled.  Lower back discomfort positional.   Skin: Negative for color change.  Neurological: Positive for tremors. Negative for speech difficulty, light-headedness and headaches.       Moves slow, fine tremor in fingers, burning sensation  in the R+ L great toes when touched by sheets, comes/goes  Psychiatric/Behavioral: Negative for behavioral problems and sleep disturbance. The patient is nervous/anxious.        Sleeps from 9pm to 4-5am usually. Feels anxious since 2pm sinemet    Immunization History  Administered Date(s) Administered  . Influenza Split 10/20/2012  . Influenza Whole 10/21/2006  . Influenza, High Dose Seasonal PF 10/17/2016, 11/02/2019  . Influenza-Unspecified 10/20/2013, 10/22/2017  . Moderna Sars-Covid-2 Vaccination 01/22/2019, 02/19/2019, 11/29/2019  . Pneumococcal Conjugate-13 01/31/2013  . Pneumococcal Polysaccharide-23 01/20/2005, 11/24/2011  . Td 01/21/2004  . Tdap 03/16/2014  . Zoster 04/17/2009   Pertinent  Health Maintenance Due   Topic Date Due  . INFLUENZA VACCINE  08/20/2020  . DEXA SCAN  Completed  . PNA vac Low Risk Adult  Completed  . COLONOSCOPY (Pts 45-63yr Insurance coverage will need to be confirmed)  Discontinued   Fall Risk  05/03/2018 11/03/2017 08/04/2017 03/06/2017 11/26/2016  Falls in the past year? 1 No No Yes Yes  Number falls in past yr: 0 - - 2 or more 2 or more  Injury with Fall? 0 - - No No  Risk Factor Category  - - - High Fall Risk High Fall Risk  Risk for fall due to : Other (Comment) - - - -  Follow up Falls evaluation completed;Education provided;Falls prevention discussed - - Falls evaluation completed Falls evaluation completed   Functional Status Survey:    Vitals:   05/18/20 1420  BP: 106/62  Pulse: 74  Resp: 18  Temp: (!) 97.5 F (36.4 C)  SpO2: 96%  Weight: 135 lb (61.2 kg)   Body mass index is 20.53 kg/m. Physical Exam Vitals and nursing note reviewed.  Constitutional:      Appearance: Normal appearance.  HENT:     Head: Normocephalic and atraumatic.     Mouth/Throat:     Mouth: Mucous membranes are moist.  Eyes:     Extraocular Movements: Extraocular movements intact.     Conjunctiva/sclera: Conjunctivae normal.     Pupils: Pupils are equal, round, and reactive to light.  Cardiovascular:     Rate and Rhythm: Normal rate. Rhythm irregular.     Heart sounds: No murmur heard.     Comments: DP pulses present L>R Pulmonary:     Effort: Pulmonary effort is normal.     Breath sounds: No rales.  Abdominal:     General: Bowel sounds are normal.     Palpations: Abdomen is soft.  Musculoskeletal:     Cervical back: Normal range of motion and neck supple.     Right lower leg: Edema present.     Left lower leg: Edema present.     Comments: trace edema BLE.   Skin:    General: Skin is warm and dry.  Neurological:     General: No focal deficit present.     Mental Status: She is alert and oriented to person, place, and time. Mental status is at baseline.      Coordination: Coordination abnormal.     Gait: Gait abnormal.     Comments: Moves slow, tremor in hands  Psychiatric:        Mood and Affect: Mood normal.        Behavior: Behavior normal.  Thought Content: Thought content normal.     Labs reviewed: Recent Labs    10/11/19 0000 03/23/20 0000 03/29/20 0000  NA 141 141 141  K 4.1 4.5 4.5  CL 107 107 107  CO2 27* 28* 23*  BUN '20 17 17  ' CREATININE 0.8 0.8 0.8  CALCIUM 9.4 9.2 9.4   Recent Labs    09/06/19 0000 03/23/20 0000 03/29/20 0000  AST 9* 9* 10*  ALT 3* 3* 7  ALKPHOS 62 58 61  ALBUMIN 3.8 3.5 3.8   Recent Labs    09/06/19 0000 10/11/19 0000 03/23/20 0000 03/29/20 0000  WBC 4.9 4.2 4.5 7.5  NEUTROABS 3,450  --  2,934.00  --   HGB 11.7* 12.1 11.5* 12.1  HCT 36 37 36 37  PLT 181 157 164 185   Lab Results  Component Value Date   TSH 1.30 03/29/2020   No results found for: HGBA1C Lab Results  Component Value Date   CHOL 185 03/24/2019   HDL 79 (A) 03/24/2019   LDLCALC 94 03/24/2019   LDLDIRECT 104.2 01/25/2013   TRIG 41 03/24/2019   CHOLHDL 2 08/29/2015    Significant Diagnostic Results in last 30 days:  No results found.  Assessment/Plan  Anemia stable, Hgb 12.1 03/29/20, takes Fe   GERD (gastroesophageal reflux disease) stable, on Omeprazole 53m qd.  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   Constipation Stable, takes MiraLax   Hypotension Orthostatic hypotension, improved, takes Midodrine.   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   Atrial fibrillation (HCC) Afib, heart rate is in control, on Xarelto 48m52m,offDiltiazem. Hgb 12.1 03/29/20. TSH 1.30 03/29/20   Edema of left lower extremity due to  peripheral venous insufficiency The patient has chronic edema BLE, minimsl, Off Metolazone 09/27/19.    Family/ staff Communication: plan of care reviewed with the patient and charge nurse.   Labs/tests ordered:  none  Time spend 35 minutes.

## 2020-05-18 NOTE — Assessment & Plan Note (Signed)
Parkinson's, symptoms worse in am instead of pm,  Takes  Sinemet x/6day, f/u neurolog at [facility].

## 2020-05-18 NOTE — Assessment & Plan Note (Signed)
Afib, heart rate is in control, on Xarelto 20mg  qd,offDiltiazem. Hgb 12.1 03/29/20. TSH 1.30 03/29/20

## 2020-05-18 NOTE — Assessment & Plan Note (Signed)
Urge incontinent of urine, takes Mirabegron.

## 2020-05-18 NOTE — Assessment & Plan Note (Signed)
stable, on Omeprazole 20mg qd. 

## 2020-05-18 NOTE — Assessment & Plan Note (Addendum)
Better with R hip pain, on Tylenol 1000mg  tidprn, Methocarbamol 250mg  qd.

## 2020-05-18 NOTE — Assessment & Plan Note (Signed)
The patient has chronic edema BLE, minimsl, Off Metolazone 09/27/19.

## 2020-05-18 NOTE — Assessment & Plan Note (Signed)
Stable, takes MiraLax  

## 2020-05-18 NOTE — Assessment & Plan Note (Signed)
stable,Hgb 12.1 03/29/20,takes Fe  

## 2020-05-18 NOTE — Assessment & Plan Note (Signed)
Orthostatic hypotension, improved, takes Midodrine.

## 2020-05-22 ENCOUNTER — Encounter: Payer: Self-pay | Admitting: Orthopedic Surgery

## 2020-05-22 ENCOUNTER — Non-Acute Institutional Stay (SKILLED_NURSING_FACILITY): Payer: Medicare PPO | Admitting: Orthopedic Surgery

## 2020-05-22 DIAGNOSIS — R350 Frequency of micturition: Secondary | ICD-10-CM

## 2020-05-22 DIAGNOSIS — R140 Abdominal distension (gaseous): Secondary | ICD-10-CM

## 2020-05-22 DIAGNOSIS — R300 Dysuria: Secondary | ICD-10-CM

## 2020-05-22 MED ORDER — SIMETHICONE 80 MG PO CHEW: 80.0000 mg | CHEWABLE_TABLET | Freq: Four times a day (QID) | ORAL | 0 refills | Status: DC | PRN

## 2020-05-22 NOTE — Progress Notes (Signed)
Location:  [facility] Room Number: [place] of Service:  SNF [phone]) Provider:    [name], MD  Patient Care Team: [name], MD as PCP - General (Internal Medicine) [name], [name], MD as PCP - Cardiology (Cardiology) [name], [name], DO as Consulting Physician (Neurology) Mast, Man X, NP as Nurse Practitioner (Internal Medicine)  Extended Emergency Contact Information Primary Emergency Contact: Belia, Febo Mobile Phone: [phone] Relation: Daughter Interpreter needed? No Secondary Emergency Contact: [facility] Address: [address]          [place], [place] [address] [name] of [place] Phone: [phone] Mobile Phone: [phone] Relation: Son  Code Status:  DNR Goals of care: Advanced Directive information Advanced Directives 03/27/2020  Does Patient Have a Medical Advance Directive? Yes  Type of Paramedic of [place];Out of facility DNR (pink MOST or yellow form)  Does patient want to make changes to medical advance directive? -  Copy of [place] in Chart? -  Would patient like information on creating a medical advance directive? -  Pre-existing out of facility DNR order (yellow form or pink MOST form) -     Chief Complaint  Patient presents with  . Acute Visit    Dysuria    HPI:  Pt is a 80 y.o. female seen today for acute visit for dysuria and increased urinary frequency.   Symptoms began 05/02. Dysuria worse yesterday then today. Urinary frequency kept her up most of the night. She admits to not drinking a lot of fluids due to frequency. Also taking Myrbetriq. Reports last urinary tract infection about one year ago.   During exam she complained about bloating. Began yesterday. Bloating comes and goes. Painful at times the relieved by passing gas. Last bowel movement 05/02.  Asking for something to help with gas and gas pain.   Nurse does not report any  concerns, vitals stable.    Past Medical History:  Diagnosis Date  . Acute lower UTI 09/14/2018  . Anemia 10/15/2018   2016 colonoscopy 10/14/18 wbc 5.4, Hgb 10.1, plt 184 12/01/18 wbc 4.5, Hgb 11.1, plt 188, neutrophils 63,  Na 139, K 3.7, Bun 23, creat 0.69, eGFR 83 on Fe, Hgb 11.7 12/09/18   . Atrial fibrillation ([place]) 05/21/2010   10/07/18 Na 143, K 4.0, Bun 20, creat 0.79, eGFR 72, wbc 7.1, Hgb 10.9, plt 172 10/28/18 Na 142, K 4.0, Bun 19, creat 0.77, eGFR 74 11/18/18 Na 144, K 4.1, Bun 21, creat 0.69, eGFR 83  . Bilateral lower extremity edema 07/19/2018   11/02/18 BMP 2 weeks.   . Cervical pain (neck) 06/20/2010  . Closed fracture of part of upper end of humerus 05/01/2015  . Colles' fracture of right radius 03/05/2015  . Constipation 07/19/2018  . DEGENERATIVE JOINT DISEASE, RIGHT HIP 02/16/2007  . Dupuytren's contracture   . Dysuria 09/20/2018   09/20/18 c/o got up several times last night to go urinate, burning on urination, lower abd /back discomfort, but urinary frequency, leakage are not new. UA C/S, Pyridium 163m tid x 2 days.  09/21/18 wbc 6.1, Hgb 11.8, plt 210, neutrophil 69.4, Na 142, K 4.1, Bun 19, creat 0.78, TP 6.4, albumin 3.9  . Hematuria 02/04/2014  . Hypotension 09/18/2018  . Long term (current) use of anticoagulants 05/29/2016  . Osteoarthritis of hip    right  . Osteoarthritis of left knee   . Overactive bladder 10/06/2018  . Parkinson disease ([place]   .  Paroxysmal A-fib ([place])   . POSTMENOPAUSAL SYNDROME 02/16/2007  . PREMATURE ATRIAL CONTRACTIONS 02/16/2007  . 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 S/P breast biopsy, left    two o'clock position - benign  . 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 Tremor, unspecified 10/19/2015  . Unstable gait 02/16/2017  . Vaginal atrophy 10/19/2015  . Venous insufficiency   . Weakness 09/12/2018   Past Surgical History:  Procedure Laterality Date  . BREAST BIOPSY Left   . CATARACT EXTRACTION, BILATERAL      Allergies   Allergen Reactions  . Doxycycline Nausea And Vomiting  . Sulfonamide Derivatives     REACTION: HIVES    Outpatient Encounter Medications as of 05/22/2020  Medication Sig  . acetaminophen (TYLENOL) 500 MG tablet Take 1,000 mg by mouth 3 (three) times daily as needed.   . calcium carbonate (TUMS EX) 750 MG chewable tablet Chew 1 tablet by mouth daily. Between the hours of 7am-10am.  . carbidopa-levodopa (SINEMET CR) 50-200 MG tablet Take 1 tablet by mouth at bedtime. 6.30 am 2 and 10 pm  . carbidopa-levodopa (SINEMET IR) 25-100 MG tablet Take 2 tablets by mouth 5 (five) times daily.  . Cholecalciferol (VITAMIN D3) 10 MCG (400 UNIT) CAPS Take 1 capsule by mouth daily. Between the hours of 7am-10am.  . ferrous sulfate 325 (65 FE) MG tablet Take 325 mg by mouth every Monday, Wednesday, and Friday. Give with food  . hydrocortisone 2.5 % ointment Apply 1 application topically at bedtime as needed.   . methocarbamol (ROBAXIN) 500 MG tablet Take 250 mg by mouth daily. As needed  . midodrine (PROAMATINE) 5 MG tablet Take 5 mg by mouth 3 (three) times daily with meals.  . mirabegron ER (MYRBETRIQ) 50 MG TB24 tablet Take 50 mg by mouth daily. Between the hours of 7am-10am.  . omeprazole (PRILOSEC) 20 MG capsule Take 20 mg by mouth daily.  . polyethylene glycol (MIRALAX / GLYCOLAX) 17 g packet Take 17 g by mouth daily.  . rivaroxaban (XARELTO) 20 MG TABS tablet Take 20 mg by mouth every evening. Between the hours of 5pm-6pm.  . Sodium Fluoride (PREVIDENT 5000 BOOSTER PLUS) 1.1 % PSTE Place 1 application onto teeth in the morning and at bedtime.  Marland Kitchen zinc oxide 20 % ointment Apply 1 application topically as needed for irritation. To buttocks after every incontinent episode and as needed for redness. May keep at bedside.   No facility-administered encounter medications on file as of 05/22/2020.    Review of Systems  Constitutional: Negative for activity change, appetite change, fatigue and fever.   Respiratory: Negative for cough, shortness of breath and wheezing.   Cardiovascular: Negative for chest pain and leg swelling.  Gastrointestinal: Negative for abdominal distention, abdominal pain, constipation, diarrhea and nausea.       Bloating, gas pain  Genitourinary: Positive for dysuria and frequency. Negative for hematuria and urgency.  Skin: Negative.   Psychiatric/Behavioral: Negative for dysphoric mood. The patient is not nervous/anxious.     Immunization History  Administered Date(s) Administered  . Influenza Split 10/20/2012  . Influenza Whole 10/21/2006  . Influenza, High Dose Seasonal PF 10/17/2016, 11/02/2019  . Influenza-Unspecified 10/20/2013, 10/22/2017  . Moderna Sars-Covid-2 Vaccination 01/22/2019, 02/19/2019, 11/29/2019  . Pneumococcal Conjugate-13 01/31/2013  . Pneumococcal Polysaccharide-23 01/20/2005, 11/24/2011  . Td 01/21/2004  . Tdap 03/16/2014  . Zoster 04/17/2009   Pertinent  Health Maintenance Due  Topic Date Due  . INFLUENZA VACCINE  08/20/2020  . DEXA SCAN  Completed  . PNA vac Low Risk Adult  Completed  . COLONOSCOPY (Pts 45-2yr Insurance coverage will need to be confirmed)  Discontinued   Fall Risk  05/03/2018 11/03/2017 08/04/2017 03/06/2017 11/26/2016  Falls in the past year? 1 No No Yes Yes  Number falls in past yr: 0 - - 2 or more 2 or more  Injury with Fall? 0 - - No No  Risk Factor Category  - - - High Fall Risk High Fall Risk  Risk for fall due to : Other (Comment) - - - -  Follow up Falls evaluation completed;Education provided;Falls prevention discussed - - Falls evaluation completed Falls evaluation completed   Functional Status Survey:    Vitals:   05/22/20 1147  BP: 118/70  Pulse: 72  Resp: 18  Temp: 98.1 F (36.7 C)  SpO2: 96%  Weight: 136 lb 4.8 oz (61.8 kg)  Height: '5\' 8"'  (1.727 m)   Body mass index is 20.72 kg/m. Physical Exam Constitutional:      General: She is not in acute distress.    Appearance: She is  normal weight.  HENT:     Head: Normocephalic.  Cardiovascular:     Rate and Rhythm: Normal rate. Rhythm irregular.     Pulses: Normal pulses.     Heart sounds: Normal heart sounds. No murmur heard.   Pulmonary:     Effort: Pulmonary effort is normal. No respiratory distress.     Breath sounds: Normal breath sounds. No wheezing.  Abdominal:     General: Bowel sounds are normal. There is no distension.     Palpations: Abdomen is soft.     Tenderness: There is no abdominal tenderness.  Musculoskeletal:     Right lower leg: No edema.     Left lower leg: No edema.  Skin:    General: Skin is warm and dry.     Capillary Refill: Capillary refill takes less than 2 seconds.  Neurological:     General: No focal deficit present.     Mental Status: She is alert and oriented to person, place, and time.  Psychiatric:        Mood and Affect: Mood normal.        Behavior: Behavior normal.     Labs reviewed: Recent Labs    10/11/19 0000 03/23/20 0000 03/29/20 0000  NA 141 141 141  K 4.1 4.5 4.5  CL 107 107 107  CO2 27* 28* 23*  BUN '20 17 17  ' CREATININE 0.8 0.8 0.8  CALCIUM 9.4 9.2 9.4   Recent Labs    09/06/19 0000 03/23/20 0000 03/29/20 0000  AST 9* 9* 10*  ALT 3* 3* 7  ALKPHOS 62 58 61  ALBUMIN 3.8 3.5 3.8   Recent Labs    09/06/19 0000 10/11/19 0000 03/23/20 0000 03/29/20 0000  WBC 4.9 4.2 4.5 7.5  NEUTROABS 3,450  --  2,934.00  --   HGB 11.7* 12.1 11.5* 12.1  HCT 36 37 36 37  PLT 181 157 164 185   Lab Results  Component Value Date   TSH 1.30 03/29/2020   No results found for: HGBA1C Lab Results  Component Value Date   CHOL 185 03/24/2019   HDL 79 (A) 03/24/2019   LDLCALC 94 03/24/2019   LDLDIRECT 104.2 01/25/2013   TRIG 41 03/24/2019   CHOLHDL 2 08/29/2015    Significant Diagnostic Results in last 30 days:  No results found.  Assessment/Plan 1. Dysuria - afebrile, no confusion, history of cystitis  in past- last episode 10/2019 - UA and urine  culture - cranberry supplement 450 mg po daily - advised to hydrate with water- 4-6 glasses/day- stop 2 hours prior to bedtime  2. Urinary frequency - same as above  3. Bloating - normal bowel sounds, abdomen soft - simethicone chewable tablet- 80 mg po Q6 prn   Family/ staff Communication: plan discussed with patient and nurse  Labs/tests ordered: UA and urine culture

## 2020-05-22 NOTE — Progress Notes (Signed)
Location:   [facility] Room Number: [place] of Service:  SNF [phone]) Provider:  Windell Moulding, NP    Patient Care Team: [name], MD as PCP - General (Internal Medicine) [name], [name], MD as PCP - Cardiology (Cardiology) [name], [name], DO as Consulting Physician (Neurology) Mast, Man X, NP as Nurse Practitioner (Internal Medicine)  Extended Emergency Contact Information Primary Emergency Contact: Sham[place], Moree Mobile Phone: [phone] Relation: Daughter Interpreter needed? No Secondary Emergency Contact: [facility] Address: [address]          [place], [place] [address] [name] of [place] Phone: [phone] Mobile Phone: [phone] Relation: Son  Code Status:   DNR Goals of care: Advanced Directive information Advanced Directives 03/27/2020  Does Patient Have a Medical Advance Directive? Yes  Type of Paramedic of [place];Out of facility DNR (pink MOST or yellow form)  Does patient want to make changes to medical advance directive? -  Copy of [place] in Chart? -  Would patient like information on creating a medical advance directive? -  Pre-existing out of facility DNR order (yellow form or pink MOST form) -     Chief Complaint  Patient presents with  . Acute Visit    Dysuria    HPI:  Pt is a 80 y.o. female seen today for an acute visit for    Past Medical History:  Diagnosis Date  . Acute lower UTI 09/14/2018  . Anemia 10/15/2018   2016 colonoscopy 10/14/18 wbc 5.4, Hgb 10.1, plt 184 12/01/18 wbc 4.5, Hgb 11.1, plt 188, neutrophils 63,  Na 139, K 3.7, Bun 23, creat 0.69, eGFR 83 on Fe, Hgb 11.7 12/09/18   . Atrial fibrillation ([place]) 05/21/2010   10/07/18 Na 143, K 4.0, Bun 20, creat 0.79, eGFR 72, wbc 7.1, Hgb 10.9, plt 172 10/28/18 Na 142, K 4.0, Bun 19, creat 0.77, eGFR 74 11/18/18 Na 144, K 4.1, Bun 21, creat 0.69, eGFR 83  . Bilateral lower extremity edema 07/19/2018    11/02/18 BMP 2 weeks.   . Cervical pain (neck) 06/20/2010  . Closed fracture of part of upper end of humerus 05/01/2015  . Colles' fracture of right radius 03/05/2015  . Constipation 07/19/2018  . DEGENERATIVE JOINT DISEASE, RIGHT HIP 02/16/2007  . Dupuytren's contracture   . Dysuria 09/20/2018   09/20/18 c/o got up several times last night to go urinate, burning on urination, lower abd /back discomfort, but urinary frequency, leakage are not new. UA C/S, Pyridium 170m tid x 2 days.  09/21/18 wbc 6.1, Hgb 11.8, plt 210, neutrophil 69.4, Na 142, K 4.1, Bun 19, creat 0.78, TP 6.4, albumin 3.9  . Hematuria 02/04/2014  . Hypotension 09/18/2018  . Long term (current) use of anticoagulants 05/29/2016  . Osteoarthritis of hip    right  . Osteoarthritis of left knee   . Overactive bladder 10/06/2018  . Parkinson disease ([place]   . Paroxysmal A-fib ([place]   . POSTMENOPAUSAL SYNDROME 02/16/2007  . PREMATURE ATRIAL CONTRACTIONS 02/16/2007  . 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 S/P breast biopsy, left    two o'clock position - benign  . 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 Tremor, unspecified 10/19/2015  . Unstable gait 02/16/2017  . Vaginal atrophy 10/19/2015  . Venous insufficiency   . Weakness 09/12/2018   Past Surgical History:  Procedure Laterality Date  . BREAST BIOPSY Left   . CATARACT EXTRACTION, BILATERAL  Allergies  Allergen Reactions  . Doxycycline Nausea And Vomiting  . Sulfonamide Derivatives     REACTION: HIVES    Allergies as of 05/22/2020      Reactions   Doxycycline Nausea And Vomiting   Sulfonamide Derivatives    REACTION: HIVES      Medication List       Accurate as of May 22, 2020 11:56 AM. If you have any questions, ask your nurse or doctor.        acetaminophen 500 MG tablet Commonly known as: TYLENOL Take 1,000 mg by mouth 3 (three) times daily as needed.   calcium carbonate 750 MG chewable tablet Commonly known as: TUMS EX Chew 1 tablet by  mouth daily. Between the hours of 7am-10am.   carbidopa-levodopa 25-100 MG tablet Commonly known as: SINEMET IR Take 2 tablets by mouth 5 (five) times daily.   carbidopa-levodopa 50-200 MG tablet Commonly known as: SINEMET CR Take 1 tablet by mouth at bedtime. 6.30 am 2 and 10 pm   ferrous sulfate 325 (65 FE) MG tablet Take 325 mg by mouth every Monday, Wednesday, and Friday. Give with food   hydrocortisone 2.5 % ointment Apply 1 application topically at bedtime as needed.   methocarbamol 500 MG tablet Commonly known as: ROBAXIN Take 250 mg by mouth daily. As needed   midodrine 5 MG tablet Commonly known as: PROAMATINE Take 5 mg by mouth 3 (three) times daily with meals.   mirabegron ER 50 MG Tb24 tablet Commonly known as: MYRBETRIQ Take 50 mg by mouth daily. Between the hours of 7am-10am.   omeprazole 20 MG capsule Commonly known as: PRILOSEC Take 20 mg by mouth daily.   polyethylene glycol 17 g packet Commonly known as: MIRALAX / GLYCOLAX Take 17 g by mouth daily.   PreviDent 5000 Booster Plus 1.1 % Pste Generic drug: Sodium Fluoride Place 1 application onto teeth in the morning and at bedtime.   rivaroxaban 20 MG Tabs tablet Commonly known as: XARELTO Take 20 mg by mouth every evening. Between the hours of 5pm-6pm.   Vitamin D3 10 MCG (400 UNIT) Caps Take 1 capsule by mouth daily. Between the hours of 7am-10am.   zinc oxide 20 % ointment Apply 1 application topically as needed for irritation. To buttocks after every incontinent episode and as needed for redness. May keep at bedside.       Review of Systems  Immunization History  Administered Date(s) Administered  . Influenza Split 10/20/2012  . Influenza Whole 10/21/2006  . Influenza, High Dose Seasonal PF 10/17/2016, 11/02/2019  . Influenza-Unspecified 10/20/2013, 10/22/2017  . Moderna Sars-Covid-2 Vaccination 01/22/2019, 02/19/2019, 11/29/2019  . Pneumococcal Conjugate-13 01/31/2013  . Pneumococcal  Polysaccharide-23 01/20/2005, 11/24/2011  . Td 01/21/2004  . Tdap 03/16/2014  . Zoster 04/17/2009   Pertinent  Health Maintenance Due  Topic Date Due  . INFLUENZA VACCINE  08/20/2020  . DEXA SCAN  Completed  . PNA vac Low Risk Adult  Completed  . COLONOSCOPY (Pts 45-21yr Insurance coverage will need to be confirmed)  Discontinued   Fall Risk  05/03/2018 11/03/2017 08/04/2017 03/06/2017 11/26/2016  Falls in the past year? 1 No No Yes Yes  Number falls in past yr: 0 - - 2 or more 2 or more  Injury with Fall? 0 - - No No  Risk Factor Category  - - - High Fall Risk High Fall Risk  Risk for fall due to : Other (Comment) - - - -  Follow up Falls evaluation completed;Education  provided;Falls prevention discussed - - Falls evaluation completed Falls evaluation completed   Functional Status Survey:    Vitals:   05/22/20 1147  BP: 118/70  Pulse: 72  Resp: 18  Temp: 98.1 F (36.7 C)  SpO2: 96%  Weight: 136 lb 4.8 oz (61.8 kg)  Height: _0  (1.727 m)   Body mass index is 20.72 kg/m. Physical Exam  Labs reviewed: Recent Labs    10/11/19 0000 03/23/20 0000 03/29/20 0000  NA 141 141 141  K 4.1 4.5 4.5  CL 107 107 107  CO2 27* 28* 23*  BUN _1 CREATININE 0.8 0.8 0.8  CALCIUM 9.4 9.2 9.4   Recent Labs    09/06/19 0000 03/23/20 0000 03/29/20 0000  AST 9* 9* 10*  ALT 3* 3* 7  ALKPHOS 62 58 61  ALBUMIN 3.8 3.5 3.8   Recent Labs    09/06/19 0000 10/11/19 0000 03/23/20 0000 03/29/20 0000  WBC 4.9 4.2 4.5 7.5  NEUTROABS 3,450  --  2,934.00  --   HGB 11.7* 12.1 11.5* 12.1  HCT 36 37 36 37  PLT 181 157 164 185   Lab Results  Component Value Date   TSH 1.30 03/29/2020   No results found for: HGBA1C Lab Results  Component Value Date   CHOL 185 03/24/2019   HDL 79 (A) 03/24/2019   LDLCALC 94 03/24/2019   LDLDIRECT 104.2 01/25/2013   TRIG 41 03/24/2019   CHOLHDL 2 08/29/2015    Significant Diagnostic Results in last 30 days:  No results  found.  Assessment/Plan There are no diagnoses linked to this encounter.   Family/ staff Communication:   Labs/tests ordered:

## 2020-05-23 ENCOUNTER — Non-Acute Institutional Stay (SKILLED_NURSING_FACILITY): Payer: Medicare PPO | Admitting: Nurse Practitioner

## 2020-05-23 ENCOUNTER — Encounter: Payer: Self-pay | Admitting: Nurse Practitioner

## 2020-05-23 DIAGNOSIS — G20C Parkinsonism, unspecified: Secondary | ICD-10-CM

## 2020-05-23 DIAGNOSIS — D500 Iron deficiency anemia secondary to blood loss (chronic): Secondary | ICD-10-CM

## 2020-05-23 DIAGNOSIS — K5900 Constipation, unspecified: Secondary | ICD-10-CM

## 2020-05-23 DIAGNOSIS — N3941 Urge incontinence: Secondary | ICD-10-CM

## 2020-05-23 DIAGNOSIS — I10 Essential (primary) hypertension: Secondary | ICD-10-CM | POA: Diagnosis not present

## 2020-05-23 DIAGNOSIS — M8949 Other hypertrophic osteoarthropathy, multiple sites: Secondary | ICD-10-CM

## 2020-05-23 DIAGNOSIS — G903 Multi-system degeneration of the autonomic nervous system: Secondary | ICD-10-CM | POA: Diagnosis not present

## 2020-05-23 DIAGNOSIS — G20 Parkinson's disease: Secondary | ICD-10-CM | POA: Diagnosis not present

## 2020-05-23 DIAGNOSIS — R109 Unspecified abdominal pain: Secondary | ICD-10-CM | POA: Diagnosis not present

## 2020-05-23 DIAGNOSIS — M159 Polyosteoarthritis, unspecified: Secondary | ICD-10-CM

## 2020-05-23 DIAGNOSIS — I872 Venous insufficiency (chronic) (peripheral): Secondary | ICD-10-CM

## 2020-05-23 DIAGNOSIS — D649 Anemia, unspecified: Secondary | ICD-10-CM | POA: Diagnosis not present

## 2020-05-23 DIAGNOSIS — R040 Epistaxis: Secondary | ICD-10-CM | POA: Diagnosis not present

## 2020-05-23 DIAGNOSIS — I480 Paroxysmal atrial fibrillation: Secondary | ICD-10-CM

## 2020-05-23 DIAGNOSIS — K219 Gastro-esophageal reflux disease without esophagitis: Secondary | ICD-10-CM | POA: Diagnosis not present

## 2020-05-23 DIAGNOSIS — R1013 Epigastric pain: Secondary | ICD-10-CM

## 2020-05-23 DIAGNOSIS — N3000 Acute cystitis without hematuria: Secondary | ICD-10-CM

## 2020-05-23 DIAGNOSIS — N390 Urinary tract infection, site not specified: Secondary | ICD-10-CM | POA: Diagnosis not present

## 2020-05-23 DIAGNOSIS — M150 Primary generalized (osteo)arthritis: Secondary | ICD-10-CM

## 2020-05-23 LAB — BASIC METABOLIC PANEL
BUN: 23 — AB (ref 4–21)
CO2: 27 — AB (ref 13–22)
Chloride: 105 (ref 99–108)
Creatinine: 0.7 (ref 0.5–1.1)
Glucose: 91
Potassium: 4.3 (ref 3.4–5.3)
Sodium: 139 (ref 137–147)

## 2020-05-23 LAB — HEPATIC FUNCTION PANEL
ALT: 3 — AB (ref 7–35)
AST: 10 — AB (ref 13–35)
Alkaline Phosphatase: 60 (ref 25–125)
Bilirubin, Total: 0.8

## 2020-05-23 LAB — CBC AND DIFFERENTIAL
HCT: 38 (ref 36–46)
Hemoglobin: 12.4 (ref 12.0–16.0)
Neutrophils Absolute: 6083
Platelets: 174 (ref 150–399)
WBC: 7.4

## 2020-05-23 LAB — CBC: RBC: 4.03 (ref 3.87–5.11)

## 2020-05-23 LAB — COMPREHENSIVE METABOLIC PANEL
Albumin: 4.2 (ref 3.5–5.0)
Calcium: 9.7 (ref 8.7–10.7)
GFR calc Af Amer: 89
GFR calc non Af Amer: 77
Globulin: 2.7

## 2020-05-23 NOTE — Assessment & Plan Note (Signed)
stable,Hgb 12.1 03/29/20,takes Fe

## 2020-05-23 NOTE — Assessment & Plan Note (Signed)
Afib, heart rate is in control, on Xarelto 20mg qd,offDiltiazem. Hgb 12.1 03/29/20. TSH 1.30 03/29/20  

## 2020-05-23 NOTE — Assessment & Plan Note (Signed)
takes MiraLax, last BM 4 days ago, will give MOM x1 stat

## 2020-05-23 NOTE — Assessment & Plan Note (Signed)
The patient has chronic edema BLE.Off Metolazone 09/27/19.

## 2020-05-23 NOTE — Assessment & Plan Note (Signed)
Parkinson's, symptoms worse in am instead of pm,  Takes  Sinemet 6x/day, f/u neurolog at [facility].

## 2020-05-23 NOTE — Progress Notes (Addendum)
Location:   SNF [place] Room Number: 13 Place of Service:  SNF (31) Provider: [facility] Keimora Swartout NP  [name], MD  Patient Care Team: [name], MD as PCP - General (Internal Medicine) [name], [name], MD as PCP - Cardiology (Cardiology) [name], [name], DO as Consulting Physician (Neurology) Brittian Renaldo X, NP as Nurse Practitioner (Internal Medicine)  Extended Emergency Contact Information Primary Emergency Contact: Sindy, Mccune Mobile Phone: [phone] Relation: Daughter Interpreter needed? No Secondary Emergency Contact: [facility] Address: [address]          [place], [place] [address] [name] of [place] Phone: [phone] Mobile Phone: [phone] Relation: Son  Code Status: DNR Goals of care: Advanced Directive information Advanced Directives 03/27/2020  Does Patient Have a Medical Advance Directive? Yes  Type of Paramedic of [place];Out of facility DNR (pink MOST or yellow form)  Does patient want to make changes to medical advance directive? -  Copy of [place] in Chart? -  Would patient like information on creating a medical advance directive? -  Pre-existing out of facility DNR order (yellow form or pink MOST form) -     Chief Complaint  Patient presents with  . Acute Visit    Nose bleed, abd pain.     HPI:  Pt is a 80 y.o. female seen today for an acute visit for c/o abd pain, mostly in epigastric region, no rebound tenderness. Last BM 4 days ago. No noted nausea, vomiting.   The left  nostril bleed was stopped by applying vaseline coated saline wet gauze pressure dressing. Held Xarelto. No pain or further bleeding.   Saw Amy Fargo NP 05/22/20 for dysuria, pending UA C/S   The patient has chronic edema BLE.Off Metolazone 09/27/19.  Afib, heart rate is in control, on Xarelto 74m qd,offDiltiazem. Hgb 12.1 03/29/20. TSH 1.30 03/29/20 R hip pain, on Tylenol  10048mtidprn, Methocarbamol 25028md.  Parkinson's, symptoms worse in am instead of pm,  Takes  Sinemet 6x/day[facility], improved, takes Midodrine.  Anemia, stable,Hgb 12.1 03/29/20,takes Fe GERD, stable, on Omeprazole 50m42m. Urge incontinent of urine, takes Mirabegron. Constipation, takes MiraLax  Past Medical History:  Diagnosis Date  . Acute lower UTI 09/14/2018  . Anemia 10/15/2018   2016 colonoscopy 10/14/18 wbc 5.4, Hgb 10.1, plt 184 12/01/18 wbc 4.5, Hgb 11.1, plt 188, neutrophils 63,  Na 139, K 3.7, Bun 23, creat 0.69, eGFR 83 on Fe, Hgb 11.7 12/09/18   . Atrial fibrillation (HCC)[place]1/2012   10/07/18 Na 143, K 4.0, Bun 20, creat 0.79, eGFR 72, wbc 7.1, Hgb 10.9, plt 172 10/28/18 Na 142, K 4.0, Bun 19, creat 0.77, eGFR 74 11/18/18 Na 144, K 4.1, Bun 21, creat 0.69, eGFR 83  . Bilateral lower extremity edema 07/19/2018   11/02/18 BMP 2 weeks.   . Cervical pain (neck) 06/20/2010  . Closed fracture of part of upper end of humerus 05/01/2015  . Colles' fracture of right radius 03/05/2015  . Constipation 07/19/2018  . DEGENERATIVE JOINT DISEASE, RIGHT HIP 02/16/2007  . Dupuytren's contracture   . Dysuria 09/20/2018   09/20/18 c/o got up several times last night to go urinate, burning on urination, lower abd /back discomfort, but urinary frequency, leakage are not new. UA C/S, Pyridium 100mg58m x 2 days.  09/21/18 wbc 6.1, Hgb 11.8, plt 210, neutrophil 69.4, Na 142, K 4.1, Bun 19, creat 0.78, TP 6.4, albumin  3.9  . Hematuria 02/04/2014  . Hypotension 09/18/2018  . Long term (current) use of anticoagulants 05/29/2016  . Osteoarthritis of hip    right  . Osteoarthritis of left knee   . Overactive bladder 10/06/2018  . Parkinson disease ([place])   . Paroxysmal A-fib ([place])   . POSTMENOPAUSAL SYNDROME 02/16/2007  . PREMATURE ATRIAL CONTRACTIONS 02/16/2007  . 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 S/P breast biopsy, left    two o'clock position - benign  . 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 Tremor, unspecified 10/19/2015  . Unstable gait 02/16/2017  . Vaginal atrophy 10/19/2015  . Venous insufficiency   . Weakness 09/12/2018   Past Surgical History:  Procedure Laterality Date  . BREAST BIOPSY Left   . CATARACT EXTRACTION, BILATERAL      Allergies  Allergen Reactions  . Doxycycline Nausea And Vomiting  . Sulfonamide Derivatives     REACTION: HIVES    Allergies as of 05/23/2020      Reactions   Doxycycline Nausea And Vomiting   Sulfonamide Derivatives    REACTION: HIVES      Medication List       Accurate as of May 23, 2020 11:59 PM. If you have any questions, ask your nurse or doctor.        acetaminophen 500 MG tablet Commonly known as: TYLENOL Take 1,000 mg by mouth 3 (three) times daily as needed.   calcium carbonate 750 MG chewable tablet Commonly known as: TUMS EX Chew 1 tablet by mouth daily. Between the hours of 7am-10am.   carbidopa-levodopa 25-100 MG tablet Commonly known as: SINEMET IR Take 2 tablets by mouth 5 (five) times daily.   carbidopa-levodopa 50-200 MG tablet Commonly known as: SINEMET CR Take 1 tablet by mouth at bedtime. 6.30 am 2 and 10 pm   ferrous sulfate 325 (65 FE) MG tablet Take 325 mg by mouth every Monday, Wednesday, and Friday. Give with food   hydrocortisone 2.5 % ointment Apply 1 application topically at bedtime as needed.   methocarbamol 500 MG tablet Commonly known as: ROBAXIN Take 250 mg by mouth daily. As needed   midodrine 5 MG tablet Commonly known as: PROAMATINE Take 5 mg by mouth 3 (three) times daily with meals.   mirabegron ER 50 MG Tb24 tablet Commonly known as: MYRBETRIQ Take 50 mg by mouth daily. Between the hours of 7am-10am.   omeprazole 20 MG capsule Commonly known as: PRILOSEC Take 20 mg by mouth daily.   polyethylene glycol 17 g packet Commonly known as:  MIRALAX / GLYCOLAX Take 17 g by mouth daily.   PreviDent 5000 Booster Plus 1.1 % Pste Generic drug: Sodium Fluoride Place 1 application onto teeth in the morning and at bedtime.   rivaroxaban 20 MG Tabs tablet Commonly known as: XARELTO Take 20 mg by mouth every evening. Between the hours of 5pm-6pm.   simethicone 80 MG chewable tablet Commonly known as: Gas-X Chew 1 tablet (80 mg total) by mouth every 6 (six) hours as needed for flatulence.   Vitamin D3 10 MCG (400 UNIT) Caps Take 1 capsule by mouth daily. Between the hours of 7am-10am.   zinc oxide 20 % ointment Apply 1 application topically as needed for irritation. To buttocks after every incontinent episode and as needed for redness. May keep at bedside.       Review of Systems  Constitutional: Positive for activity change, appetite change and fatigue. Negative for fever and unexpected weight change.  Fatigue in am  HENT: Positive for hearing loss and nosebleeds. Negative for congestion, ear pain, facial swelling, rhinorrhea, sinus pressure, sinus pain, sneezing, sore throat, trouble swallowing and voice change.   Eyes: Negative for visual disturbance.  Respiratory: Negative for cough and shortness of breath.   Cardiovascular: Positive for leg swelling.  Gastrointestinal: Positive for abdominal pain and constipation. Negative for blood in stool, diarrhea, nausea, rectal pain and vomiting.  Genitourinary: Positive for frequency and urgency. Negative for dysuria and hematuria.  Musculoskeletal: Positive for arthralgias, back pain and gait problem. Negative for myalgias.       Right hip pain is well controlled. Lower back discomfort positional.   Skin: Negative for color change.  Neurological: Positive for tremors. Negative for speech difficulty, light-headedness and headaches.       Moves slow, fine tremor in fingers, burning sensation  in the R+ L great toes when touched by sheets, comes/goes  Psychiatric/Behavioral:  Negative for behavioral problems and sleep disturbance. The patient is nervous/anxious.        Sleeps from 9pm to 4-5am usually. Feels anxious since 2pm sinemet    Immunization History  Administered Date(s) Administered  . Influenza Split 10/20/2012  . Influenza Whole 10/21/2006  . Influenza, High Dose Seasonal PF 10/17/2016, 11/02/2019  . Influenza-Unspecified 10/20/2013, 10/22/2017  . Moderna Sars-Covid-2 Vaccination 01/22/2019, 02/19/2019, 11/29/2019  . Pneumococcal Conjugate-13 01/31/2013  . Pneumococcal Polysaccharide-23 01/20/2005, 11/24/2011  . Td 01/21/2004  . Tdap 03/16/2014  . Zoster 04/17/2009   Pertinent  Health Maintenance Due  Topic Date Due  . INFLUENZA VACCINE  08/20/2020  . DEXA SCAN  Completed  . PNA vac Low Risk Adult  Completed  . COLONOSCOPY (Pts 45-70yr Insurance coverage will need to be confirmed)  Discontinued   Fall Risk  05/03/2018 11/03/2017 08/04/2017 03/06/2017 11/26/2016  Falls in the past year? 1 No No Yes Yes  Number falls in past yr: 0 - - 2 or more 2 or more  Injury with Fall? 0 - - No No  Risk Factor Category  - - - High Fall Risk High Fall Risk  Risk for fall due to : Other (Comment) - - - -  Follow up Falls evaluation completed;Education provided;Falls prevention discussed - - Falls evaluation completed Falls evaluation completed   Functional Status Survey:    Vitals:   05/23/20 1100  BP: 137/81  Pulse: 77  Resp: 18  Temp: 97.6 F (36.4 C)  SpO2: 99%   There is no height or weight on file to calculate BMI. Physical Exam Vitals and nursing note reviewed.  Constitutional:      Comments: More fatigue than usual  HENT:     Head: Normocephalic and atraumatic.     Nose:     Comments: Left nostril bleeding stopped, excoriated lining of the left nostril seen    Mouth/Throat:     Mouth: Mucous membranes are moist.  Eyes:     Extraocular Movements: Extraocular movements intact.     Conjunctiva/sclera: Conjunctivae normal.     Pupils:  Pupils are equal, round, and reactive to light.  Cardiovascular:     Rate and Rhythm: Normal rate. Rhythm irregular.     Heart sounds: No murmur heard.     Comments: DP pulses present L>R Pulmonary:     Effort: Pulmonary effort is normal.     Breath sounds: No rales.  Abdominal:     General: Bowel sounds are normal.     Palpations: Abdomen is soft.  Tenderness: There is abdominal tenderness. There is no right CVA tenderness, left CVA tenderness, guarding or rebound.     Comments: Pain palpated in the epigastric region.   Musculoskeletal:     Cervical back: Normal range of motion and neck supple.     Right lower leg: Edema present.     Left lower leg: Edema present.     Comments: trace edema BLE.   Skin:    General: Skin is warm and dry.  Neurological:     General: No focal deficit present.     Mental Status: She is alert and oriented to person, place, and time. Mental status is at baseline.     Coordination: Coordination abnormal.     Gait: Gait abnormal.     Comments: Moves slow, tremor in hands  Psychiatric:        Mood and Affect: Mood normal.        Behavior: Behavior normal.        Thought Content: Thought content normal.     Labs reviewed: Recent Labs    10/11/19 0000 03/23/20 0000 03/29/20 0000  NA 141 141 141  K 4.1 4.5 4.5  CL 107 107 107  CO2 27* 28* 23*  BUN _0 CREATININE 0.8 0.8 0.8  CALCIUM 9.4 9.2 9.4   Recent Labs    09/06/19 0000 03/23/20 0000 03/29/20 0000  AST 9* 9* 10*  ALT 3* 3* 7  ALKPHOS 62 58 61  ALBUMIN 3.8 3.5 3.8   Recent Labs    09/06/19 0000 10/11/19 0000 03/23/20 0000 03/29/20 0000  WBC 4.9 4.2 4.5 7.5  NEUTROABS 3,450  --  2,934.00  --   HGB 11.7* 12.1 11.5* 12.1  HCT 36 37 36 37  PLT 181 157 164 185   Lab Results  Component Value Date   TSH 1.30 03/29/2020   No results found for: HGBA1C Lab Results  Component Value Date   CHOL 185 03/24/2019   HDL 79 (A) 03/24/2019   LDLCALC 94 03/24/2019    LDLDIRECT 104.2 01/25/2013   TRIG 41 03/24/2019   CHOLHDL 2 08/29/2015    Significant Diagnostic Results in last 30 days:  No results found.  Assessment/Plan: Bleeding from the nose he left  nostril bleed was stopped by applying vaseline coated saline wet gauze pressure dressing. Held Xarelto. No pain or further bleeding.  Will apply saline nasal spray bid to the R+L nostrils   Abdominal pain c/o abd pain, mostly in epigastric region, no rebound tenderness. Last BM 4 days ago. No noted nausea, vomiting. Will obtain X-ray abd. MOM x1 for constipation, pending UA C/S, update CBC/diff, CMP/eGFR. 05/23/20 X-ray abd no bowel obstruction, no acute abnormality.  05/23/20 wbc 7.4, Hgb 12.4, plt 174, neutrophils 82.2, Na 139, K 4.2, Bun 23, creat 0.74, eGFR 77  Constipation takes MiraLax, last BM 4 days ago, will give MOM x1 stat   Urge incontinence of urine Urge incontinent of urine, takes Mirabegron.  GERD (gastroesophageal reflux disease)  stable, on Omeprazole 85m qd.  Anemia stable,Hgb 12.1 03/29/20,takes Fe   Hypotension Orthostatic hypotension, improved, takes Midodrine.   Parkinsonism ([place] Parkinson's, symptoms worse in am instead of pm,  Takes  [facility] 6x/day[facility]   Osteoarthritis, multiple sites R hip pain, on Tylenol 10054mtidprn, Methocarbamol 25021md.   Atrial fibrillation (HCC) Afib, heart rate is in control, on Xarelto 70m76m,offDiltiazem. Hgb 12.1 03/29/20. TSH 1.30 03/29/20   Edema of left  lower extremity due to peripheral venous insufficiency The patient has chronic edema BLE.Off Metolazone 09/27/19.   UTI (urinary tract infection) 05/23/20 urine culture K. Pneumoniae Nitrofurantoin 156m bid x 7 days.     Family/ staff Communication: plan of care reviewed with the patient and charge nurse.   Labs/tests ordered:  CBC/diff, CMP/eGFR, X-ray abd  Time spend 35 minutes.

## 2020-05-23 NOTE — Assessment & Plan Note (Signed)
R hip pain, on Tylenol 1000mg tidprn, Methocarbamol 250mg qd.   

## 2020-05-23 NOTE — Assessment & Plan Note (Signed)
he left  nostril bleed was stopped by applying vaseline coated saline wet gauze pressure dressing. Held Xarelto. No pain or further bleeding.  Will apply saline nasal spray bid to the R+L nostrils

## 2020-05-23 NOTE — Assessment & Plan Note (Addendum)
c/o abd pain, mostly in epigastric region, no rebound tenderness. Last BM 4 days ago. No noted nausea, vomiting. Will obtain X-ray abd. MOM x1 for constipation, pending UA C/S, update CBC/diff, CMP/eGFR. 05/23/20 X-ray abd no bowel obstruction, no acute abnormality.  05/23/20 wbc 7.4, Hgb 12.4, plt 174, neutrophils 82.2, Na 139, K 4.2, Bun 23, creat 0.74, eGFR 77

## 2020-05-23 NOTE — Assessment & Plan Note (Signed)
Orthostatic hypotension, improved, takes Midodrine.

## 2020-05-23 NOTE — Assessment & Plan Note (Signed)
Urge incontinent of urine, takes Mirabegron.  

## 2020-05-23 NOTE — Assessment & Plan Note (Signed)
stable, on Omeprazole 20mg qd. 

## 2020-05-25 NOTE — Assessment & Plan Note (Signed)
05/23/20 urine culture K. Pneumoniae Nitrofurantoin 100mg  bid x 7 days.

## 2020-06-04 ENCOUNTER — Non-Acute Institutional Stay (SKILLED_NURSING_FACILITY): Payer: Medicare PPO | Admitting: Nurse Practitioner

## 2020-06-04 ENCOUNTER — Encounter: Payer: Self-pay | Admitting: Nurse Practitioner

## 2020-06-04 DIAGNOSIS — M159 Polyosteoarthritis, unspecified: Secondary | ICD-10-CM

## 2020-06-04 DIAGNOSIS — I872 Venous insufficiency (chronic) (peripheral): Secondary | ICD-10-CM | POA: Diagnosis not present

## 2020-06-04 DIAGNOSIS — G903 Multi-system degeneration of the autonomic nervous system: Secondary | ICD-10-CM | POA: Diagnosis not present

## 2020-06-04 DIAGNOSIS — G20 Parkinson's disease: Secondary | ICD-10-CM

## 2020-06-04 DIAGNOSIS — D500 Iron deficiency anemia secondary to blood loss (chronic): Secondary | ICD-10-CM

## 2020-06-04 DIAGNOSIS — K5900 Constipation, unspecified: Secondary | ICD-10-CM

## 2020-06-04 DIAGNOSIS — N3941 Urge incontinence: Secondary | ICD-10-CM

## 2020-06-04 DIAGNOSIS — I480 Paroxysmal atrial fibrillation: Secondary | ICD-10-CM | POA: Diagnosis not present

## 2020-06-04 DIAGNOSIS — K219 Gastro-esophageal reflux disease without esophagitis: Secondary | ICD-10-CM | POA: Diagnosis not present

## 2020-06-04 NOTE — Assessment & Plan Note (Signed)
on Tylenol 1000mg tid prn, Methocarbamol 250mg qd.    

## 2020-06-04 NOTE — Assessment & Plan Note (Signed)
Urge incontinent of urine, takes Mirabegron.  

## 2020-06-04 NOTE — Assessment & Plan Note (Signed)
c/o gas, abdominal pressure after breakfast, positioning changes help some, reducing amount of intake helps some. Dc Famotine, try Pantoprazole, will also try Simethicone 80mg  with meals.

## 2020-06-04 NOTE — Assessment & Plan Note (Signed)
improved, takes Midodrine.  

## 2020-06-04 NOTE — Progress Notes (Signed)
Location:   [facility] Room Number: [phone] Place of Service:  SNF (31) Provider: Lennie Odor Malori Myers NP  [name], MD  Patient Care Team: [name], MD as PCP - General (Internal Medicine) [name], [name], MD as PCP - Cardiology (Cardiology) [name], [name], DO as Consulting Physician (Neurology) Jammi Morrissette X, NP as Nurse Practitioner (Internal Medicine)  Extended Emergency Contact Information Primary Emergency Contact: Danaysia, Rader Mobile Phone: [phone] Relation: Daughter Interpreter needed? No Secondary Emergency Contact: [facility] Address: [address]          [place], [place] [address] [name] of [place] Phone: [phone] Mobile Phone: [phone] Relation: Son  Code Status: DNR Goals of care: Advanced Directive information Advanced Directives 06/04/2020  Does Patient Have a Medical Advance Directive? Yes  Type of Advance Directive [place]  Does patient want to make changes to medical advance directive? No - Patient declined  Copy of [place] in Chart? Yes - validated most recent copy scanned in chart (See row information)  Would patient like information on creating a medical advance directive? -  Pre-existing out of facility DNR order (yellow form or pink MOST form) -     Chief Complaint  Patient presents with  . Acute Visit    Patient complains of having gas after breakfast.    HPI:  Pt is a 80 y.o. female seen today for an acute visit for c/o gas, abdominal pressure after breakfast, positioning changes help some, reducing amount of intake helps some.   The patient has chronic edema BLE.Off Metolazone 09/27/19.  Afib, heart rate is in control, on Xarelto 62m qd,offDiltiazem. Hgb 12.4 05/23/20,  TSH1.30 03/29/20 R hip pain, on Tylenol 10029mtidprn, Methocarbamol 25056md.  Parkinson's, symptoms worse inam instead of pm, Takes Sinemet 6x/day,  [facility] Parthostatic hypotension,improved, takes Midodrine. Anemia, stable,Hgb 12.4 05/23/20,takes Fe GERD, change to Protonix 83m64m 06/04/20 Urge incontinent of urine, takes Mirabegron. Constipation, takes MiraLax     Past Medical History:  Diagnosis Date  . Acute lower UTI 09/14/2018  . Anemia 10/15/2018   2016 colonoscopy 10/14/18 wbc 5.4, Hgb 10.1, plt 184 12/01/18 wbc 4.5, Hgb 11.1, plt 188, neutrophils 63,  Na 139, K 3.7, Bun 23, creat 0.69, eGFR 83 on Fe, Hgb 11.7 12/09/18   . Atrial fibrillation (HCC)[place]1/2012   10/07/18 Na 143, K 4.0, Bun 20, creat 0.79, eGFR 72, wbc 7.1, Hgb 10.9, plt 172 10/28/18 Na 142, K 4.0, Bun 19, creat 0.77, eGFR 74 11/18/18 Na 144, K 4.1, Bun 21, creat 0.69, eGFR 83  . Bilateral lower extremity edema 07/19/2018   11/02/18 BMP 2 weeks.   . Cervical pain (neck) 06/20/2010  . Closed fracture of part of upper end of humerus 05/01/2015  . Colles' fracture of right radius 03/05/2015  . Constipation 07/19/2018  . DEGENERATIVE JOINT DISEASE, RIGHT HIP 02/16/2007  . Dupuytren's contracture   . Dysuria 09/20/2018   09/20/18 c/o got up several times last night to go urinate, burning on urination, lower abd /back discomfort, but urinary frequency, leakage are not new. UA C/S, Pyridium 100mg12m x 2 days.  09/21/18 wbc 6.1, Hgb 11.8, plt 210, neutrophil 69.4, Na 142, K 4.1, Bun 19, creat 0.78, TP 6.4, albumin 3.9  . Hematuria 02/04/2014  . Hypotension 09/18/2018  . Long term (current) use of anticoagulants 05/29/2016  . Osteoarthritis of hip    right  . Osteoarthritis of left knee   .  Overactive bladder 10/06/2018  . Parkinson disease ([place])   . Paroxysmal A-fib ([place])   . POSTMENOPAUSAL SYNDROME 02/16/2007  . PREMATURE ATRIAL CONTRACTIONS 02/16/2007  . 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 S/P breast biopsy, left    two o'clock position - benign  . Toxic effect of venom(989.5)  07/27/2007  . Tremor, unspecified 10/19/2015  . Unstable gait 02/16/2017  . Vaginal atrophy 10/19/2015  . Venous insufficiency   . Weakness 09/12/2018   Past Surgical History:  Procedure Laterality Date  . BREAST BIOPSY Left   . CATARACT EXTRACTION, BILATERAL      Allergies  Allergen Reactions  . Doxycycline Nausea And Vomiting  . Sulfonamide Derivatives     REACTION: HIVES    Allergies as of 06/04/2020      Reactions   Doxycycline Nausea And Vomiting   Sulfonamide Derivatives    REACTION: HIVES      Medication List       Accurate as of Jun 04, 2020  4:26 PM. If you have any questions, ask your nurse or doctor.        STOP taking these medications   omeprazole 20 MG capsule Commonly known as: PRILOSEC Stopped by: Ebony Yorio X Macedonio Scallon, NP     TAKE these medications   acetaminophen 500 MG tablet Commonly known as: TYLENOL Take 1,000 mg by mouth 3 (three) times daily as needed.   calcium carbonate 750 MG chewable tablet Commonly known as: TUMS EX Chew 1 tablet by mouth daily. Between the hours of 7am-10am.   carbidopa-levodopa 25-100 MG tablet Commonly known as: SINEMET IR Take 2 tablets by mouth 5 (five) times daily.   carbidopa-levodopa 50-200 MG tablet Commonly known as: SINEMET CR Take 1 tablet by mouth at bedtime. 6.30 am 2 and 10 pm   Cranberry 450 MG Tabs Take 1 tablet by mouth daily.   ferrous sulfate 325 (65 FE) MG tablet Take 325 mg by mouth every Monday, Wednesday, and Friday. Give with food   hydrocortisone 2.5 % ointment Apply 1 application topically at bedtime as needed.   methocarbamol 500 MG tablet Commonly known as: ROBAXIN Take 250 mg by mouth daily. As needed   midodrine 5 MG tablet Commonly known as: PROAMATINE Take 5 mg by mouth 3 (three) times daily with meals.   mirabegron ER 50 MG Tb24 tablet Commonly known as: MYRBETRIQ Take 50 mg by mouth daily. Between the hours of 7am-10am.   pantoprazole 40 MG tablet Commonly known as:  PROTONIX Take 40 mg by mouth daily.   polyethylene glycol 17 g packet Commonly known as: MIRALAX / GLYCOLAX Take 17 g by mouth daily.   PreviDent 5000 Booster Plus 1.1 % Pste Generic drug: Sodium Fluoride Place 1 application onto teeth in the morning and at bedtime.   rivaroxaban 20 MG Tabs tablet Commonly known as: XARELTO Take 20 mg by mouth every evening. Between the hours of 5pm-6pm.   simethicone 80 MG chewable tablet Commonly known as: Gas-X Chew 1 tablet (80 mg total) by mouth every 6 (six) hours as needed for flatulence.   Sodium Chloride 3 % Aers Place 1 spray into the nose 2 (two) times daily.   Vitamin D3 10 MCG (400 UNIT) Caps Take 1 capsule by mouth daily. Between the hours of 7am-10am.   zinc oxide 20 % ointment Apply 1 application topically as needed for irritation. To buttocks after every incontinent episode and as needed for redness. May keep at bedside.  Review of Systems  Constitutional: Negative for activity change, appetite change and fever.       Am fatigue.   HENT: Positive for hearing loss. Negative for congestion and trouble swallowing.   Eyes: Negative for visual disturbance.  Respiratory: Negative for shortness of breath.   Cardiovascular: Positive for leg swelling.       Gas after breakfast  Gastrointestinal: Negative for abdominal pain, constipation, nausea and vomiting.  Genitourinary: Positive for frequency and urgency. Negative for dysuria and hematuria.  Musculoskeletal: Positive for arthralgias, back pain and gait problem. Negative for myalgias.       Right hip pain is well controlled. Lower back discomfort positional.   Skin: Negative for color change.  Neurological: Positive for tremors. Negative for speech difficulty and light-headedness.       Moves slow, fine tremor in fingers, burning sensation  in the R+ L great toes when touched by sheets, comes/goes  Psychiatric/Behavioral: Negative for behavioral problems and sleep  disturbance. The patient is nervous/anxious.        Sleeps from 9pm to 4-5am usually. Feels anxious since 2pm sinemet    Immunization History  Administered Date(s) Administered  . Influenza Split 10/20/2012  . Influenza Whole 10/21/2006  . Influenza, High Dose Seasonal PF 10/17/2016, 11/02/2019  . Influenza-Unspecified 10/20/2013, 10/22/2017  . Moderna Sars-Covid-2 Vaccination 01/22/2019, 02/19/2019, 11/29/2019  . Pneumococcal Conjugate-13 01/31/2013  . Pneumococcal Polysaccharide-23 01/20/2005, 11/24/2011  . Td 01/21/2004  . Tdap 03/16/2014  . Zoster 04/17/2009   Pertinent  Health Maintenance Due  Topic Date Due  . INFLUENZA VACCINE  08/20/2020  . DEXA SCAN  Completed  . PNA vac Low Risk Adult  Completed  . COLONOSCOPY (Pts 45-63yrs Insurance coverage will need to be confirmed)  Discontinued   Fall Risk  05/03/2018 11/03/2017 08/04/2017 03/06/2017 11/26/2016  Falls in the past year? 1 No No Yes Yes  Number falls in past yr: 0 - - 2 or more 2 or more  Injury with Fall? 0 - - No No  Risk Factor Category  - - - High Fall Risk High Fall Risk  Risk for fall due to : Other (Comment) - - - -  Follow up Falls evaluation completed;Education provided;Falls prevention discussed - - Falls evaluation completed Falls evaluation completed   Functional Status Survey:    Vitals:   06/04/20 1446  BP: 120/68  Pulse: 66  Resp: 18  Temp: 97.6 F (36.4 C)  SpO2: 96%  Weight: 135 lb 8 oz (61.5 kg)  Height: $Remove'5\' 8"'NrHpSoa$  (1.727 m)   Body mass index is 20.6 kg/m. Physical Exam Vitals and nursing note reviewed.  Constitutional:      Appearance: Normal appearance.  HENT:     Head: Normocephalic and atraumatic.     Nose: Nose normal.     Mouth/Throat:     Mouth: Mucous membranes are moist.  Eyes:     Extraocular Movements: Extraocular movements intact.     Conjunctiva/sclera: Conjunctivae normal.     Pupils: Pupils are equal, round, and reactive to light.  Cardiovascular:     Rate and Rhythm:  Normal rate. Rhythm irregular.     Heart sounds: No murmur heard.     Comments: DP pulses present L>R Pulmonary:     Effort: Pulmonary effort is normal.     Breath sounds: No rales.  Abdominal:     General: Bowel sounds are normal.     Palpations: Abdomen is soft.     Tenderness: There is no abdominal  tenderness. There is no right CVA tenderness, left CVA tenderness, guarding or rebound.     Comments: Pain palpated in the epigastric region.   Musculoskeletal:     Cervical back: Normal range of motion and neck supple.     Right lower leg: Edema present.     Left lower leg: Edema present.     Comments: trace edema BLE.   Skin:    General: Skin is warm and dry.  Neurological:     General: No focal deficit present.     Mental Status: She is alert and oriented to person, place, and time. Mental status is at baseline.     Coordination: Coordination abnormal.     Gait: Gait abnormal.     Comments: Moves slow, tremor in hands  Psychiatric:        Mood and Affect: Mood normal.        Behavior: Behavior normal.     Labs reviewed: Recent Labs    03/23/20 0000 03/29/20 0000 05/23/20 0000  NA 141 141 139  K 4.5 4.5 4.3  CL 107 107 105  CO2 28* 23* 27*  BUN 17 17 23*  CREATININE 0.8 0.8 0.7  CALCIUM 9.2 9.4 9.7   Recent Labs    03/23/20 0000 03/29/20 0000 05/23/20 0000  AST 9* 10* 10*  ALT 3* 7 3*  ALKPHOS 58 61 60  ALBUMIN 3.5 3.8 4.2   Recent Labs    09/06/19 0000 10/11/19 0000 03/23/20 0000 03/29/20 0000 05/23/20 0000  WBC 4.9   < > 4.5 7.5 7.4  NEUTROABS 3,450  --  2,934.00  --  6,083.00  HGB 11.7*   < > 11.5* 12.1 12.4  HCT 36   < > 36 37 38  PLT 181   < > 164 185 174   < > = values in this interval not displayed.   Lab Results  Component Value Date   TSH 1.30 03/29/2020   No results found for: HGBA1C Lab Results  Component Value Date   CHOL 185 03/24/2019   HDL 79 (A) 03/24/2019   LDLCALC 94 03/24/2019   LDLDIRECT 104.2 01/25/2013   TRIG 41  03/24/2019   CHOLHDL 2 08/29/2015    Significant Diagnostic Results in last 30 days:  No results found.  Assessment/Plan: GERD (gastroesophageal reflux disease) c/o gas, abdominal pressure after breakfast, positioning changes help some, reducing amount of intake helps some. Dc Famotine, try Pantoprazole, will also try Simethicone 29m with meals.   Edema of left lower extremity due to peripheral venous insufficiency Off Metolazone 09/27/19.    Atrial fibrillation (HCC) heart rate is in control, on Xarelto 234mqd,offDiltiazem. 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   Parkinsonism (HCC)[place], symptoms worse inam instead of pm, Takes Sinemet 6x/day, [facility], takes Midodrine.   Anemia stable,Hgb 12.4 05/23/20, takes Fe   Urge incontinence of urine Urge incontinent of urine, takes Mirabegron.  Constipation Stable, takes MiraLax.     Family/ staff Communication: plan of care reviewed with the patient and charge nurse.   Labs/tests ordered: none  Time spend 35 minutes.

## 2020-06-04 NOTE — Assessment & Plan Note (Addendum)
stable,Hgb 12.4 05/23/20,takes Fe  

## 2020-06-04 NOTE — Assessment & Plan Note (Signed)
Off Metolazone 09/27/19.

## 2020-06-04 NOTE — Assessment & Plan Note (Signed)
Stable, takes MiraLax  

## 2020-06-04 NOTE — Assessment & Plan Note (Signed)
arkinson's, symptoms worse inam instead of pm, Takes Sinemet 6x/day, f/u neurolog at [facility].

## 2020-06-04 NOTE — Assessment & Plan Note (Addendum)
heart rate is in control, on Xarelto 20mg qd, off Diltiazem. Hgb 12.4 05/23/20,  TSH 1.30 03/29/20 

## 2020-06-05 ENCOUNTER — Ambulatory Visit: Payer: Medicare PPO | Admitting: Family Medicine

## 2020-06-06 ENCOUNTER — Other Ambulatory Visit: Payer: Self-pay | Admitting: Nurse Practitioner

## 2020-06-08 DIAGNOSIS — I951 Orthostatic hypotension: Secondary | ICD-10-CM | POA: Diagnosis not present

## 2020-06-08 DIAGNOSIS — G20 Parkinson's disease: Secondary | ICD-10-CM | POA: Diagnosis not present

## 2020-06-08 DIAGNOSIS — Z79899 Other long term (current) drug therapy: Secondary | ICD-10-CM | POA: Diagnosis not present

## 2020-06-13 ENCOUNTER — Encounter: Payer: Self-pay | Admitting: Nurse Practitioner

## 2020-06-13 ENCOUNTER — Non-Acute Institutional Stay (SKILLED_NURSING_FACILITY): Payer: Medicare PPO | Admitting: Nurse Practitioner

## 2020-06-13 DIAGNOSIS — D500 Iron deficiency anemia secondary to blood loss (chronic): Secondary | ICD-10-CM | POA: Diagnosis not present

## 2020-06-13 DIAGNOSIS — G20 Parkinson's disease: Secondary | ICD-10-CM | POA: Diagnosis not present

## 2020-06-13 DIAGNOSIS — M159 Polyosteoarthritis, unspecified: Secondary | ICD-10-CM

## 2020-06-13 DIAGNOSIS — G903 Multi-system degeneration of the autonomic nervous system: Secondary | ICD-10-CM

## 2020-06-13 DIAGNOSIS — I872 Venous insufficiency (chronic) (peripheral): Secondary | ICD-10-CM

## 2020-06-13 DIAGNOSIS — N3941 Urge incontinence: Secondary | ICD-10-CM

## 2020-06-13 DIAGNOSIS — I480 Paroxysmal atrial fibrillation: Secondary | ICD-10-CM

## 2020-06-13 DIAGNOSIS — K5901 Slow transit constipation: Secondary | ICD-10-CM

## 2020-06-13 DIAGNOSIS — K219 Gastro-esophageal reflux disease without esophagitis: Secondary | ICD-10-CM

## 2020-06-13 NOTE — Assessment & Plan Note (Addendum)
symptoms worse inam instead of pm, Takes Sinemet 6x/day, started Comtan tid per neurology,  f/u neurolog at [facility].

## 2020-06-13 NOTE — Assessment & Plan Note (Signed)
Orthostatic hypotension,improved, takes Midodrine.

## 2020-06-13 NOTE — Assessment & Plan Note (Signed)
change to Protonix 40mg  qd 06/04/20, better c/o gas, abdominal pressure after breakfast, positioning changes help some, reducing amount of intake helps some.

## 2020-06-13 NOTE — Assessment & Plan Note (Signed)
Stable, continue MiraLax.  °

## 2020-06-13 NOTE — Assessment & Plan Note (Signed)
Urge incontinent of urine, takes Mirabegron.

## 2020-06-13 NOTE — Assessment & Plan Note (Signed)
R hip pain, on Tylenol 1000mg  tidprn, Methocarbamol 250mg  qd.

## 2020-06-13 NOTE — Assessment & Plan Note (Signed)
heart rate is in control, on Xarelto 20mg  qd,offDiltiazem. Hgb 12.4 05/23/20,  TSH1.30 03/29/20

## 2020-06-13 NOTE — Assessment & Plan Note (Signed)
stable,Hgb 12.4 05/23/20,takes Fe

## 2020-06-13 NOTE — Assessment & Plan Note (Signed)
The patient has chronic edema BLE.Off Metolazone 09/27/19.

## 2020-06-13 NOTE — Progress Notes (Signed)
Location:   [facility] Room Number: [phone] Place of Service:  SNF (31) Provider:  Orvile Corona Otho Darner, NP    Patient Care Team: [name], MD as PCP - General (Internal Medicine) [name], [name], MD as PCP - Cardiology (Cardiology) [name], [name], DO as Consulting Physician (Neurology) Zeola Brys X, NP as Nurse Practitioner (Internal Medicine)  Extended Emergency Contact Information Primary Emergency Contact: Jozlin, Bently Mobile Phone: [phone] Relation: Daughter Interpreter needed? No Secondary Emergency Contact: [facility] Address: [address]          [place], [place] [address] [name] of [place] Phone: [phone] Mobile Phone: [phone] Relation: Son  Code Status:  DNR Goals of care: Advanced Directive information Advanced Directives 06/13/2020  Does Patient Have a Medical Advance Directive? Yes  Type of Advance Directive [place]  Does patient want to make changes to medical advance directive? No - Patient declined  Copy of [place] in Chart? Yes - validated most recent copy scanned in chart (See row information)  Would patient like information on creating a medical advance directive? -  Pre-existing out of facility DNR order (yellow form or pink MOST form) -     Chief Complaint  Patient presents with  . Medical Management of Chronic Issues    Routine follow up.    HPI:  Pt is a 80 y.o. female seen today for medical management of chronic diseases.    The patient has chronic edema BLE.Off Metolazone 09/27/19.  Afib, heart rate is in control, on Xarelto 65m qd,offDiltiazem. Hgb 12.4 05/23/20,  TSH1.30 03/29/20 R hip pain, on Tylenol 10018mtidprn, Methocarbamol 25062md.  Parkinson's, symptoms worse inam instead of pm, Takes Sinemet 6x/day, started Comtan tid,  [facility],  takes Midodrine. Anemia, stable,Hgb 12.4 05/23/20,takes Fe GERD, change to Protonix 36m4m 06/04/20, better c/o gas, abdominal pressure after breakfast, positioning changes help some, reducing amount of intake helps some.   Urge incontinent of urine, takes Mirabegron. Constipation, takes MiraLax   Past Medical History:  Diagnosis Date  . Acute lower UTI 09/14/2018  . Anemia 10/15/2018   2016 colonoscopy 10/14/18 wbc 5.4, Hgb 10.1, plt 184 12/01/18 wbc 4.5, Hgb 11.1, plt 188, neutrophils 63,  Na 139, K 3.7, Bun 23, creat 0.69, eGFR 83 on Fe, Hgb 11.7 12/09/18   . Atrial fibrillation (HCC)[place]1/2012   10/07/18 Na 143, K 4.0, Bun 20, creat 0.79, eGFR 72, wbc 7.1, Hgb 10.9, plt 172 10/28/18 Na 142, K 4.0, Bun 19, creat 0.77, eGFR 74 11/18/18 Na 144, K 4.1, Bun 21, creat 0.69, eGFR 83  . Bilateral lower extremity edema 07/19/2018   11/02/18 BMP 2 weeks.   . Cervical pain (neck) 06/20/2010  . Closed fracture of part of upper end of humerus 05/01/2015  . Colles' fracture of right radius 03/05/2015  . Constipation 07/19/2018  . DEGENERATIVE JOINT DISEASE, RIGHT HIP 02/16/2007  . Dupuytren's contracture   . Dysuria 09/20/2018   09/20/18 c/o got up several times last night to go urinate, burning on urination, lower abd /back discomfort, but urinary frequency, leakage are not new. UA C/S, Pyridium 100mg86m x 2 days.  09/21/18 wbc 6.1, Hgb 11.8, plt 210, neutrophil 69.4, Na 142, K 4.1, Bun 19, creat 0.78, TP 6.4, albumin 3.9  . Hematuria 02/04/2014  . Hypotension 09/18/2018  . Long term (current) use of anticoagulants 05/29/2016  . Osteoarthritis of hip  right  . Osteoarthritis of left knee   . Overactive bladder 10/06/2018  . Parkinson disease ([place])   . Paroxysmal A-fib ([place])   . POSTMENOPAUSAL SYNDROME 02/16/2007  . PREMATURE ATRIAL CONTRACTIONS 02/16/2007  . 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 S/P breast biopsy, left    two o'clock position - benign   . 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 Tremor, unspecified 10/19/2015  . Unstable gait 02/16/2017  . Vaginal atrophy 10/19/2015  . Venous insufficiency   . Weakness 09/12/2018   Past Surgical History:  Procedure Laterality Date  . BREAST BIOPSY Left   . CATARACT EXTRACTION, BILATERAL      Allergies  Allergen Reactions  . Doxycycline Nausea And Vomiting  . Sulfonamide Derivatives     REACTION: HIVES    Allergies as of 06/13/2020      Reactions   Doxycycline Nausea And Vomiting   Sulfonamide Derivatives    REACTION: HIVES      Medication List       Accurate as of Jun 13, 2020 11:59 PM. If you have any questions, ask your nurse or doctor.        acetaminophen 500 MG tablet Commonly known as: TYLENOL Take 1,000 mg by mouth 3 (three) times daily as needed.   calcium carbonate 750 MG chewable tablet Commonly known as: TUMS EX Chew 1 tablet by mouth daily. Between the hours of 7am-10am.   carbidopa-levodopa 25-100 MG tablet Commonly known as: SINEMET IR Take 2 tablets by mouth 5 (five) times daily.   carbidopa-levodopa 50-200 MG tablet Commonly known as: SINEMET CR Take 1 tablet by mouth at bedtime. 6.30 am 2 and 10 pm   Cranberry 450 MG Tabs Take 1 tablet by mouth daily.   ferrous sulfate 325 (65 FE) MG tablet Take 325 mg by mouth every Monday, Wednesday, and Friday. Give with food   hydrocortisone 2.5 % ointment Apply 1 application topically at bedtime as needed.   methocarbamol 500 MG tablet Commonly known as: ROBAXIN Take 250 mg by mouth daily. As needed   midodrine 5 MG tablet Commonly known as: PROAMATINE Take 5 mg by mouth 3 (three) times daily with meals.   mirabegron ER 50 MG Tb24 tablet Commonly known as: MYRBETRIQ Take 50 mg by mouth daily. Between the hours of 7am-10am.   pantoprazole 40 MG tablet Commonly known as: PROTONIX Take 40 mg by mouth daily.   polyethylene glycol 17 g packet Commonly known as: MIRALAX / GLYCOLAX Take 17 g by  mouth daily.   PreviDent 5000 Booster Plus 1.1 % Pste Generic drug: Sodium Fluoride Place 1 application onto teeth in the morning and at bedtime.   rivaroxaban 20 MG Tabs tablet Commonly known as: XARELTO Take 20 mg by mouth every evening. Between the hours of 5pm-6pm.   simethicone 80 MG chewable tablet Commonly known as: Gas-X Chew 1 tablet (80 mg total) by mouth every 6 (six) hours as needed for flatulence.   Sodium Chloride 3 % Aers Place 1 spray into the nose 2 (two) times daily.   Vitamin D3 10 MCG (400 UNIT) Caps Take 1 capsule by mouth daily. Between the hours of 7am-10am.   zinc oxide 20 % ointment Apply 1 application topically as needed for irritation. To buttocks after every incontinent episode and as needed for redness. May keep at bedside.       Review of Systems  Constitutional: Negative for activity change, appetite change and fever.  Am fatigue.   HENT: Positive for hearing loss. Negative for congestion and trouble swallowing.   Eyes: Negative for visual disturbance.  Respiratory: Negative for shortness of breath.   Cardiovascular: Positive for leg swelling.       Gas after breakfast is better.   Gastrointestinal: Negative for abdominal pain and constipation.  Genitourinary: Positive for frequency. Negative for dysuria, hematuria and urgency.  Musculoskeletal: Positive for arthralgias, back pain and gait problem. Negative for myalgias.       Right hip pain is well controlled. Lower back discomfort positional.   Skin: Negative for color change.  Neurological: Positive for tremors. Negative for speech difficulty and light-headedness.       Moves slow, fine tremor in fingers, burning sensation  in the R+ L great toes when touched by sheets, comes/goes  Psychiatric/Behavioral: Negative for behavioral problems and sleep disturbance. The patient is nervous/anxious.        Sleeps from 9pm to 4-5am usually, feels anxious sometimes.     Immunization History   Administered Date(s) Administered  . Influenza Split 10/20/2012  . Influenza Whole 10/21/2006  . Influenza, High Dose Seasonal PF 10/17/2016, 11/02/2019  . Influenza-Unspecified 10/20/2013, 10/22/2017  . Moderna Sars-Covid-2 Vaccination 01/22/2019, 02/19/2019, 11/29/2019  . Pneumococcal Conjugate-13 01/31/2013  . Pneumococcal Polysaccharide-23 01/20/2005, 11/24/2011  . Td 01/21/2004  . Tdap 03/16/2014  . Zoster, Live 04/17/2009   Pertinent  Health Maintenance Due  Topic Date Due  . INFLUENZA VACCINE  08/20/2020  . DEXA SCAN  Completed  . PNA vac Low Risk Adult  Completed  . COLONOSCOPY (Pts 45-63yr Insurance coverage will need to be confirmed)  Discontinued   Fall Risk  05/03/2018 11/03/2017 08/04/2017 03/06/2017 11/26/2016  Falls in the past year? 1 No No Yes Yes  Number falls in past yr: 0 - - 2 or more 2 or more  Injury with Fall? 0 - - No No  Risk Factor Category  - - - High Fall Risk High Fall Risk  Risk for fall due to : Other (Comment) - - - -  Follow up Falls evaluation completed;Education provided;Falls prevention discussed - - Falls evaluation completed Falls evaluation completed   Functional Status Survey:    Vitals:   06/13/20 0905  BP: 110/62  Pulse: 76  Resp: 18  Temp: (!) 97.1 F (36.2 C)  SpO2: 96%  Weight: 134 lb 14.4 oz (61.2 kg)  Height: '5\' 8"'  (1.727 m)   Body mass index is 20.51 kg/m. Physical Exam Vitals and nursing note reviewed.  Constitutional:      Appearance: Normal appearance.  HENT:     Head: Normocephalic and atraumatic.     Mouth/Throat:     Mouth: Mucous membranes are moist.  Eyes:     Extraocular Movements: Extraocular movements intact.     Conjunctiva/sclera: Conjunctivae normal.     Pupils: Pupils are equal, round, and reactive to light.  Cardiovascular:     Rate and Rhythm: Normal rate. Rhythm irregular.     Heart sounds: No murmur heard.     Comments: DP pulses present L>R Pulmonary:     Effort: Pulmonary effort is  normal.     Breath sounds: No rales.  Abdominal:     General: Bowel sounds are normal.     Palpations: Abdomen is soft.     Tenderness: There is no abdominal tenderness.     Comments: Pain palpated in the epigastric region.   Musculoskeletal:     Cervical back: Normal range of motion  and neck supple.     Right lower leg: Edema present.     Left lower leg: Edema present.     Comments: trace edema BLE.   Skin:    General: Skin is warm and dry.  Neurological:     General: No focal deficit present.     Mental Status: She is alert and oriented to person, place, and time. Mental status is at baseline.     Coordination: Coordination abnormal.     Gait: Gait abnormal.     Comments: Moves slow, tremor in hands  Psychiatric:        Mood and Affect: Mood normal.        Behavior: Behavior normal.     Labs reviewed: Recent Labs    03/23/20 0000 03/29/20 0000 05/23/20 0000  NA 141 141 139  K 4.5 4.5 4.3  CL 107 107 105  CO2 28* 23* 27*  BUN 17 17 23*  CREATININE 0.8 0.8 0.7  CALCIUM 9.2 9.4 9.7   Recent Labs    03/23/20 0000 03/29/20 0000 05/23/20 0000  AST 9* 10* 10*  ALT 3* 7 3*  ALKPHOS 58 61 60  ALBUMIN 3.5 3.8 4.2   Recent Labs    09/06/19 0000 10/11/19 0000 03/23/20 0000 03/29/20 0000 05/23/20 0000  WBC 4.9   < > 4.5 7.5 7.4  NEUTROABS 3,450  --  2,934.00  --  6,083.00  HGB 11.7*   < > 11.5* 12.1 12.4  HCT 36   < > 36 37 38  PLT 181   < > 164 185 174   < > = values in this interval not displayed.   Lab Results  Component Value Date   TSH 1.30 03/29/2020   No results found for: HGBA1C Lab Results  Component Value Date   CHOL 185 03/24/2019   HDL 79 (A) 03/24/2019   LDLCALC 94 03/24/2019   LDLDIRECT 104.2 01/25/2013   TRIG 41 03/24/2019   CHOLHDL 2 08/29/2015    Significant Diagnostic Results in last 30 days:  No results found.  Assessment/Plan Edema of left lower extremity due to peripheral venous insufficiency The patient has chronic edema  BLE.Off Metolazone 09/27/19.   Atrial fibrillation (HCC) heart rate is in control, on Xarelto 81m qd,offDiltiazem. 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    Parkinsonism (HCCDwightymptoms worse inam instead of pm, Takes Sinemet 6x/day, started Comtan tid per neurology,  [facility]otension Orthostatic hypotension,improved, takes Midodrine.   Anemia stable,Hgb 12.4 05/23/20,takes Fe   GERD (gastroesophageal reflux disease) change to Protonix 74m12m 06/04/20, better c/o gas, abdominal pressure after breakfast, positioning changes help some, reducing amount of intake helps some.    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   Slow transit constipation Stable, continue MiraLax.      Family/ staff Communication: plan of care reviewed with the patient and charge nurse.   Labs/tests ordered:  none   Time spend 35 minutes.

## 2020-06-14 ENCOUNTER — Encounter: Payer: Self-pay | Admitting: Nurse Practitioner

## 2020-06-22 ENCOUNTER — Encounter: Payer: Self-pay | Admitting: Internal Medicine

## 2020-06-26 ENCOUNTER — Non-Acute Institutional Stay (SKILLED_NURSING_FACILITY): Payer: Medicare PPO | Admitting: Nurse Practitioner

## 2020-06-26 ENCOUNTER — Encounter: Payer: Self-pay | Admitting: Nurse Practitioner

## 2020-06-26 DIAGNOSIS — I480 Paroxysmal atrial fibrillation: Secondary | ICD-10-CM | POA: Diagnosis not present

## 2020-06-26 DIAGNOSIS — I872 Venous insufficiency (chronic) (peripheral): Secondary | ICD-10-CM

## 2020-06-26 DIAGNOSIS — G903 Multi-system degeneration of the autonomic nervous system: Secondary | ICD-10-CM

## 2020-06-26 DIAGNOSIS — K219 Gastro-esophageal reflux disease without esophagitis: Secondary | ICD-10-CM

## 2020-06-26 DIAGNOSIS — M150 Primary generalized (osteo)arthritis: Secondary | ICD-10-CM

## 2020-06-26 DIAGNOSIS — G20 Parkinson's disease: Secondary | ICD-10-CM

## 2020-06-26 DIAGNOSIS — D500 Iron deficiency anemia secondary to blood loss (chronic): Secondary | ICD-10-CM

## 2020-06-26 DIAGNOSIS — M8949 Other hypertrophic osteoarthropathy, multiple sites: Secondary | ICD-10-CM | POA: Diagnosis not present

## 2020-06-26 DIAGNOSIS — K5901 Slow transit constipation: Secondary | ICD-10-CM

## 2020-06-26 DIAGNOSIS — G20C Parkinsonism, unspecified: Secondary | ICD-10-CM

## 2020-06-26 DIAGNOSIS — N3941 Urge incontinence: Secondary | ICD-10-CM

## 2020-06-26 DIAGNOSIS — H532 Diplopia: Secondary | ICD-10-CM | POA: Diagnosis not present

## 2020-06-26 DIAGNOSIS — M159 Polyosteoarthritis, unspecified: Secondary | ICD-10-CM

## 2020-06-26 NOTE — Assessment & Plan Note (Signed)
improved since change to Protonix 40mg  qd 06/04/20, better c/o gas, abdominal pressure after breakfast, positioning changes help some, reducing amount of intake helps some.

## 2020-06-26 NOTE — Assessment & Plan Note (Signed)
The patient has chronic edema BLE.Off Metolazone 09/27/19.

## 2020-06-26 NOTE — Assessment & Plan Note (Signed)
R hip pain, on Tylenol 1000mg  tidprn, Methocarbamol 250mg  qd. Methocarbamol can cause blurred vision, but the patient c/o double vision?

## 2020-06-26 NOTE — Assessment & Plan Note (Signed)
c/o double vision since starting taking Comtan. Neurology recommended to hold Comtan but the patient refused initially until my examination today. The patient is able to count my fingers 2-3 feet away from her eyes, no facial or limb weakness, mumbles speech as usual. The patient denied headache, chest pain/pressure, or palpitation. Will hold Comtan x 3 days. May consider Ophthalmology eval if no better. It could be just a part of the presentation of the Parkinson's disease. Observe.

## 2020-06-26 NOTE — Assessment & Plan Note (Signed)
takes Mirabegron.

## 2020-06-26 NOTE — Assessment & Plan Note (Signed)
Stable, continue MiraLax.  °

## 2020-06-26 NOTE — Assessment & Plan Note (Signed)
stable,Hgb 12.4 05/23/20,takes Fe

## 2020-06-26 NOTE — Progress Notes (Signed)
Location:   [facility] Room Number: [phone] Place of Service:  SNF (31) Provider:  Takhia Spoon Otho Darner, NP    Patient Care Team: [name], MD as PCP - General (Internal Medicine) [name], [name], MD as PCP - Cardiology (Cardiology) [name], [name], DO as Consulting Physician (Neurology) Paiden Caraveo X, NP as Nurse Practitioner (Internal Medicine)  Extended Emergency Contact Information Primary Emergency Contact: Brei, [place] Mobile Phone: [phone] Relation: Daughter Interpreter needed? No Secondary Emergency Contact: [facility] Address: [address]          [place],  [address] [name] of [place] Phone: [phone] Mobile Phone: [phone] Relation: Son  Code Status:  DNR Goals of care: Advanced Directive information Advanced Directives 06/26/2020  Does Patient Have a Medical Advance Directive? Yes  Type of Advance Directive [place]  Does patient want to make changes to medical advance directive? No - Patient declined  Copy of [place] in Chart? Yes - validated most recent copy scanned in chart (See row information)  Would patient like information on creating a medical advance directive? -  Pre-existing out of facility DNR order (yellow form or pink MOST form) -     Chief Complaint  Patient presents with  . Acute Visit    Patient complains of having double vision.     HPI:  Pt is a 80 y.o. female seen today for an acute visit for c/o double vision since starting taking Comtan. Neurology recommended to hold Comtan but the patient refused initially until my examination today. The patient is able to count my fingers 2-3 feet away from her eyes, no facial or limb weakness, mumbles speech as usual. The patient denied headache, chest pain/pressure, or palpitation.   The patient has chronic edema BLE.Off Metolazone 09/27/19.  Afib, heart rate is in control, on Xarelto 37m  qd,offDiltiazem. Hgb12.4 05/23/20,TSH1.30 03/29/20 R hip pain, on Tylenol 10082mtidprn, Methocarbamol 25034md.  Parkinson's, symptoms worse inam instead of pm, Takes Sinemet 6x/day, started Comtan tid,  [facility], takes Midodrine. Anemia, stable,Hgb 12.4 05/23/20,takes Fe GERD,improved since change to Protonix 71m29m 06/04/20, better c/o gas, abdominal pressure after breakfast, positioning changes help some, reducing amount of intake helps some.             Urge incontinent of urine, takes Mirabegron. Constipation, takes MiraLax  Past Medical History:  Diagnosis Date  . Acute lower UTI 09/14/2018  . Anemia 10/15/2018   2016 colonoscopy 10/14/18 wbc 5.4, Hgb 10.1, plt 184 12/01/18 wbc 4.5, Hgb 11.1, plt 188, neutrophils 63,  Na 139, K 3.7, Bun 23, creat 0.69, eGFR 83 on Fe, Hgb 11.7 12/09/18   . Atrial fibrillation (HCC)[place]1/2012   10/07/18 Na 143, K 4.0, Bun 20, creat 0.79, eGFR 72, wbc 7.1, Hgb 10.9, plt 172 10/28/18 Na 142, K 4.0, Bun 19, creat 0.77, eGFR 74 11/18/18 Na 144, K 4.1, Bun 21, creat 0.69, eGFR 83  . Bilateral lower extremity edema 07/19/2018   11/02/18 BMP 2 weeks.   . Cervical pain (neck) 06/20/2010  . Closed fracture of part of upper end of humerus 05/01/2015  . Colles' fracture of right radius 03/05/2015  . Constipation 07/19/2018  . DEGENERATIVE JOINT DISEASE, RIGHT HIP 02/16/2007  . Dupuytren's contracture   . Dysuria 09/20/2018   09/20/18 c/o got up several times last night to go urinate, burning on urination, lower abd /back discomfort, but urinary frequency, leakage  are not new. UA C/S, Pyridium 115m tid x 2 days.  09/21/18 wbc 6.1, Hgb 11.8, plt 210, neutrophil 69.4, Na 142, K 4.1, Bun 19, creat 0.78, TP 6.4, albumin 3.9  . Hematuria 02/04/2014  . Hypotension 09/18/2018  . Long term (current) use of anticoagulants 05/29/2016  .  Osteoarthritis of hip    right  . Osteoarthritis of left knee   . Overactive bladder 10/06/2018  . Parkinson disease ([place]   . Paroxysmal A-fib ([place]   . POSTMENOPAUSAL SYNDROME 02/16/2007  . PREMATURE ATRIAL CONTRACTIONS 02/16/2007  . 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 S/P breast biopsy, left    two o'clock position - benign  . 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 Tremor, unspecified 10/19/2015  . Unstable gait 02/16/2017  . Vaginal atrophy 10/19/2015  . Venous insufficiency   . Weakness 09/12/2018   Past Surgical History:  Procedure Laterality Date  . BREAST BIOPSY Left   . CATARACT EXTRACTION, BILATERAL      Allergies  Allergen Reactions  . Doxycycline Nausea And Vomiting  . Sulfonamide Derivatives     REACTION: HIVES    Allergies as of 06/26/2020      Reactions   Doxycycline Nausea And Vomiting   Sulfonamide Derivatives    REACTION: HIVES      Medication List       Accurate as of June 26, 2020  4:44 PM. If you have any questions, ask your nurse or doctor.        STOP taking these medications   mirabegron ER 50 MG Tb24 tablet Commonly known as: MYRBETRIQ Stopped by: Rivky Clendenning X Anacaren Kohan, NP     TAKE these medications   acetaminophen 500 MG tablet Commonly known as: TYLENOL Take 1,000 mg by mouth 3 (three) times daily as needed.   calcium carbonate 750 MG chewable tablet Commonly known as: TUMS EX Chew 1 tablet by mouth daily. Between the hours of 7am-10am.   carbidopa-levodopa 25-100 MG tablet Commonly known as: SINEMET IR Take 2 tablets by mouth 5 (five) times daily.   carbidopa-levodopa 50-200 MG tablet Commonly known as: SINEMET CR Take 1 tablet by mouth at bedtime. 6.30 am 2 and 10 pm   Cranberry 450 MG Tabs Take 1 tablet by mouth daily.   entacapone 200 MG tablet Commonly known as: COMTAN Take 200 mg by mouth 3 (three) times daily.   ferrous sulfate 325 (65 FE) MG tablet Take 325 mg by mouth every Monday, Wednesday, and Friday.  Give with food   hydrocortisone 2.5 % ointment Apply 1 application topically at bedtime as needed.   methocarbamol 500 MG tablet Commonly known as: ROBAXIN Take 250 mg by mouth daily. As needed   midodrine 5 MG tablet Commonly known as: PROAMATINE Take 5 mg by mouth 3 (three) times daily with meals.   pantoprazole 40 MG tablet Commonly known as: PROTONIX Take 40 mg by mouth daily.   polyethylene glycol 17 g packet Commonly known as: MIRALAX / GLYCOLAX Take 17 g by mouth daily.   PreviDent 5000 Booster Plus 1.1 % Pste Generic drug: Sodium Fluoride Place 1 application onto teeth in the morning and at bedtime.   rivaroxaban 20 MG Tabs tablet Commonly known as: XARELTO Take 20 mg by mouth every evening. Between the hours of 5pm-6pm.   simethicone 80 MG chewable tablet Commonly known as: Gas-X Chew 1 tablet (80 mg total) by mouth every 6 (six) hours as needed for flatulence.   Sodium Chloride 3 %  Aers Place 1 spray into the nose 2 (two) times daily.   Vitamin D3 10 MCG (400 UNIT) Caps Take 1 capsule by mouth daily. Between the hours of 7am-10am.   zinc oxide 20 % ointment Apply 1 application topically as needed for irritation. To buttocks after every incontinent episode and as needed for redness. May keep at bedside.       Review of Systems  Constitutional: Negative for activity change, appetite change and fever.       Am fatigue.   HENT: Positive for hearing loss. Negative for congestion and trouble swallowing.   Eyes: Positive for visual disturbance.       C/o double vision  Respiratory: Negative for shortness of breath.   Cardiovascular: Positive for leg swelling.       Gas after breakfast is better.   Gastrointestinal: Negative for abdominal pain and constipation.  Genitourinary: Positive for frequency. Negative for dysuria, hematuria and urgency.  Musculoskeletal: Positive for arthralgias, back pain and gait problem. Negative for myalgias.       Right hip pain  is well controlled. Lower back discomfort positional.   Skin: Negative for color change.  Neurological: Positive for tremors. Negative for speech difficulty and light-headedness.       Moves slow, fine tremor in fingers, burning sensation  in the R+ L great toes when touched by sheets, comes/goes  Psychiatric/Behavioral: Negative for behavioral problems and sleep disturbance. The patient is nervous/anxious.        Sleeps from 9pm to 4-5am usually, feels anxious sometimes.     Immunization History  Administered Date(s) Administered  . Influenza Split 10/20/2012  . Influenza Whole 10/21/2006  . Influenza, High Dose Seasonal PF 10/17/2016, 11/02/2019  . Influenza-Unspecified 10/20/2013, 10/22/2017  . Moderna Sars-Covid-2 Vaccination 01/22/2019, 02/19/2019, 11/29/2019  . Pneumococcal Conjugate-13 01/31/2013  . Pneumococcal Polysaccharide-23 01/20/2005, 11/24/2011  . Td 01/21/2004  . Tdap 03/16/2014  . Zoster, Live 04/17/2009   Pertinent  Health Maintenance Due  Topic Date Due  . INFLUENZA VACCINE  08/20/2020  . DEXA SCAN  Completed  . PNA vac Low Risk Adult  Completed  . COLONOSCOPY (Pts 45-61yr Insurance coverage will need to be confirmed)  Discontinued   Fall Risk  05/03/2018 11/03/2017 08/04/2017 03/06/2017 11/26/2016  Falls in the past year? 1 No No Yes Yes  Number falls in past yr: 0 - - 2 or more 2 or more  Injury with Fall? 0 - - No No  Risk Factor Category  - - - High Fall Risk High Fall Risk  Risk for fall due to : Other (Comment) - - - -  Follow up Falls evaluation completed;Education provided;Falls prevention discussed - - Falls evaluation completed Falls evaluation completed   Functional Status Survey:    Vitals:   06/26/20 1120  BP: 116/62  Pulse: 74  Resp: 16  Temp: (!) 96.8 F (36 C)  SpO2: 97%  Weight: 135 lb (61.2 kg)  Height: '5\' 8"'  (1.727 m)   Body mass index is 20.53 kg/m. Physical Exam Vitals and nursing note reviewed.  Constitutional:       Appearance: Normal appearance.  HENT:     Head: Normocephalic and atraumatic.     Mouth/Throat:     Mouth: Mucous membranes are moist.  Eyes:     Extraocular Movements: Extraocular movements intact.     Conjunctiva/sclera: Conjunctivae normal.     Pupils: Pupils are equal, round, and reactive to light.     Comments: Able to count my  fingers 2-3 feet away from her eyes.   Cardiovascular:     Rate and Rhythm: Normal rate. Rhythm irregular.     Heart sounds: No murmur heard.     Comments: DP pulses present L>R Pulmonary:     Effort: Pulmonary effort is normal.     Breath sounds: No rales.  Abdominal:     General: Bowel sounds are normal.     Palpations: Abdomen is soft.     Tenderness: There is no abdominal tenderness.     Comments: Pain palpated in the epigastric region.   Musculoskeletal:     Cervical back: Normal range of motion and neck supple.     Right lower leg: Edema present.     Left lower leg: Edema present.     Comments: trace edema BLE.   Skin:    General: Skin is warm and dry.  Neurological:     General: No focal deficit present.     Mental Status: She is alert and oriented to person, place, and time. Mental status is at baseline.     Coordination: Coordination abnormal.     Gait: Gait abnormal.     Comments: Moves slow, tremor in hands  Psychiatric:        Mood and Affect: Mood normal.        Behavior: Behavior normal.     Labs reviewed: Recent Labs    03/23/20 0000 03/29/20 0000 05/23/20 0000  NA 141 141 139  K 4.5 4.5 4.3  CL 107 107 105  CO2 28* 23* 27*  BUN 17 17 23*  CREATININE 0.8 0.8 0.7  CALCIUM 9.2 9.4 9.7   Recent Labs    03/23/20 0000 03/29/20 0000 05/23/20 0000  AST 9* 10* 10*  ALT 3* 7 3*  ALKPHOS 58 61 60  ALBUMIN 3.5 3.8 4.2   Recent Labs    09/06/19 0000 10/11/19 0000 03/23/20 0000 03/29/20 0000 05/23/20 0000  WBC 4.9   < > 4.5 7.5 7.4  NEUTROABS 3,450  --  2,934.00  --  6,083.00  HGB 11.7*   < > 11.5* 12.1 12.4   HCT 36   < > 36 37 38  PLT 181   < > 164 185 174   < > = values in this interval not displayed.   Lab Results  Component Value Date   TSH 1.30 03/29/2020   No results found for: HGBA1C Lab Results  Component Value Date   CHOL 185 03/24/2019   HDL 79 (A) 03/24/2019   LDLCALC 94 03/24/2019   LDLDIRECT 104.2 01/25/2013   TRIG 41 03/24/2019   CHOLHDL 2 08/29/2015    Significant Diagnostic Results in last 30 days:  No results found.  Assessment/Plan Double vision with both eyes open c/o double vision since starting taking Comtan. Neurology recommended to hold Comtan but the patient refused initially until my examination today. The patient is able to count my fingers 2-3 feet away from her eyes, no facial or limb weakness, mumbles speech as usual. The patient denied headache, chest pain/pressure, or palpitation. Will hold Comtan x 3 days. May consider Ophthalmology eval if no better. It could be just a part of the presentation of the Parkinson's disease. Observe.   Edema of left lower extremity due to peripheral venous insufficiency The patient has chronic edema BLE.Off Metolazone 09/27/19.  Atrial fibrillation (HCC) heart rate is in control, on Xarelto 37m qd,offDiltiazem. Hgb12.4 05/23/20,TSH1.30 03/29/20   Osteoarthritis, multiple sites R hip pain, on Tylenol  1073m tidprn, Methocarbamol 2562mqd. Methocarbamol can cause blurred vision, but the patient c/o double vision?  Parkinsonism ([place], symptoms worse inam instead of pm, Takes Sinemet 6x/day, started Comtan tid,  [facility]  Hypotension Orthostatic hypotension,improved, takes Midodrine.   Anemia  stable,Hgb 12.4 05/23/20,takes Fe   GERD (gastroesophageal reflux disease) improved since change to Protonix 406md 06/04/20, better c/o gas, abdominal pressure after breakfast, positioning changes help some, reducing amount of intake helps some.  Urge incontinence of  urine takes Mirabegron.  Slow transit constipation Stable, continue MiraLax.      Family/ staff Communication: plan of care reviewed with the patient and charge nurse.   Labs/tests ordered:  none  Time spend 35 minutes.

## 2020-06-26 NOTE — Assessment & Plan Note (Signed)
heart rate is in control, on Xarelto 20mg  qd,offDiltiazem. Hgb12.4 05/23/20,TSH1.30 03/29/20

## 2020-06-26 NOTE — Assessment & Plan Note (Signed)
Parkinson's, symptoms worse inam instead of pm, Takes Sinemet 6x/day, started Comtan tid,  f/u neurolog at [facility].

## 2020-06-26 NOTE — Assessment & Plan Note (Signed)
Orthostatic hypotension,improved, takes Midodrine.

## 2020-06-29 ENCOUNTER — Non-Acute Institutional Stay (SKILLED_NURSING_FACILITY): Payer: Medicare PPO | Admitting: Nurse Practitioner

## 2020-06-29 ENCOUNTER — Encounter: Payer: Self-pay | Admitting: Nurse Practitioner

## 2020-06-29 DIAGNOSIS — D500 Iron deficiency anemia secondary to blood loss (chronic): Secondary | ICD-10-CM

## 2020-06-29 DIAGNOSIS — N3941 Urge incontinence: Secondary | ICD-10-CM

## 2020-06-29 DIAGNOSIS — G20 Parkinson's disease: Secondary | ICD-10-CM

## 2020-06-29 DIAGNOSIS — I480 Paroxysmal atrial fibrillation: Secondary | ICD-10-CM | POA: Diagnosis not present

## 2020-06-29 DIAGNOSIS — M8949 Other hypertrophic osteoarthropathy, multiple sites: Secondary | ICD-10-CM | POA: Diagnosis not present

## 2020-06-29 DIAGNOSIS — H532 Diplopia: Secondary | ICD-10-CM | POA: Diagnosis not present

## 2020-06-29 DIAGNOSIS — R600 Localized edema: Secondary | ICD-10-CM

## 2020-06-29 DIAGNOSIS — M150 Primary generalized (osteo)arthritis: Secondary | ICD-10-CM

## 2020-06-29 DIAGNOSIS — G903 Multi-system degeneration of the autonomic nervous system: Secondary | ICD-10-CM | POA: Diagnosis not present

## 2020-06-29 DIAGNOSIS — G20C Parkinsonism, unspecified: Secondary | ICD-10-CM

## 2020-06-29 DIAGNOSIS — M159 Polyosteoarthritis, unspecified: Secondary | ICD-10-CM

## 2020-06-29 DIAGNOSIS — K219 Gastro-esophageal reflux disease without esophagitis: Secondary | ICD-10-CM | POA: Diagnosis not present

## 2020-06-29 DIAGNOSIS — K5901 Slow transit constipation: Secondary | ICD-10-CM

## 2020-06-29 NOTE — Assessment & Plan Note (Signed)
heart rate is in control, on Xarelto 20mg  qd, off Diltiazem. Hgb 12.4 05/23/20,  TSH 1.30 03/29/20

## 2020-06-29 NOTE — Assessment & Plan Note (Addendum)
Able to count my fingers 3 feet away from her eyes, intact visual fields upon my examination. The patient's daughter stated the patient's neurologist recommended ED eval if no change of double vision after holding Comtan x 3 days-will support the decision of the patient and her daughter. Needs  f/u Ophthalmology ASAP, f/u Neurology. The patient desires resume Comtan.

## 2020-06-29 NOTE — Assessment & Plan Note (Signed)
improved, takes Midodrine.

## 2020-06-29 NOTE — Assessment & Plan Note (Signed)
mproved since change to Protonix 40mg  qd 06/04/20, better c/o gas, abdominal pressure after breakfast, positioning changes help some, reducing amount of intake helps some.

## 2020-06-29 NOTE — Assessment & Plan Note (Signed)
The patient has chronic edema BLE. Off Metolazone 09/27/19.

## 2020-06-29 NOTE — Assessment & Plan Note (Signed)
Stable, continue MiraLax.  °

## 2020-06-29 NOTE — Progress Notes (Signed)
Location:   [place] Room Number: [phone] Place of Service:  SNF (31) Provider:  Cniyah Sproull Otho Darner, NP    Patient Care Team: [name], MD as PCP - General (Internal Medicine) [name], [name], MD as PCP - Cardiology (Cardiology) [name], [name], DO as Consulting Physician (Neurology) Mckynlie Vanderslice X, NP as Nurse Practitioner (Internal Medicine)  Extended Emergency Contact Information Primary Emergency Contact: [place], [place] Mobile Phone: [phone] Relation: Daughter Interpreter needed? No Secondary Emergency Contact: [facility] Address: [address]          [place], [place] [address] [name] of [place] Phone: [phone] Mobile Phone: [phone] Relation: Son  Code Status:  DNR Goals of care: Advanced Directive information Advanced Directives 06/29/2020  Does Patient Have a Medical Advance Directive? Yes  Type of Advance Directive [place]  Does patient want to make changes to medical advance directive? No - Patient declined  Copy of [place] in Chart? Yes - validated most recent copy scanned in chart (See row information)  Would patient like information on creating a medical advance directive? -  Pre-existing out of facility DNR order (yellow form or pink MOST form) -     Chief Complaint  Patient presents with   Acute Visit    Patient presents for revaluation of double vision.     HPI:  Pt is a 80 y.o. female seen today for an acute visit for f/u c/o double vision  06/29/20 the patient stated her double vision has no change since Comtan held since 06/26/20 and desires resuming it. The patient denied headache, dizziness, chest pain/pressure, palpitation. No noted facial or limb weakness today. Her visual fields intact per my examination. Her charge nurse was present during today's visit.   06/26/20 c/o double vision since starting taking Comtan. Neurology recommended to hold Comtan but the patient  refused initially until my examination 06/26/20 The patient is able to count my fingers 2-3 feet away from her eyes, no facial or limb weakness, mumbles speech as usual. The patient denied headache, chest pain/pressure, or palpitation.              The patient has chronic edema BLE. Off Metolazone 09/27/19.             Afib, heart rate is in control, on Xarelto 50m qd, off Diltiazem. Hgb 12.4 05/23/20,  TSH 1.30 03/29/20             R hip pain,  on Tylenol 10053mtid prn, Methocarbamol 25030md.                Parkinson's, symptoms worse in am instead of pm,  Takes  Sinemet 6x/day, started Comtan tid,  [facility]            Orthostatic hypotension, improved, takes Midodrine.              Anemia, stable, Hgb 12.4 05/23/20,  takes Fe             GERD, improved since change to Protonix 57m65m 06/04/20, better c/o gas, abdominal pressure after breakfast, positioning changes help some, reducing amount of intake helps some.              Urge incontinent of urine, takes Mirabegron.              Constipation, takes MiraLax   Past Medical History:  Diagnosis Date   Acute  lower UTI 09/14/2018   Anemia 10/15/2018   2016 colonoscopy 10/14/18 wbc 5.4, Hgb 10.1, plt 184 12/01/18 wbc 4.5, Hgb 11.1, plt 188, neutrophils 63,  Na 139, K 3.7, Bun 23, creat 0.69, eGFR 83 on Fe, Hgb 11.7 12/09/18    Atrial fibrillation ([place]) 05/21/2010   10/07/18 Na 143, K 4.0, Bun 20, creat 0.79, eGFR 72, wbc 7.1, Hgb 10.9, plt 172 10/28/18 Na 142, K 4.0, Bun 19, creat 0.77, eGFR 74 11/18/18 Na 144, K 4.1, Bun 21, creat 0.69, eGFR 83   Bilateral lower extremity edema 07/19/2018   11/02/18 BMP 2 weeks.    Cervical pain (neck) 06/20/2010   Closed fracture of part of upper end of humerus 05/01/2015   Colles' fracture of right radius 03/05/2015   Constipation 07/19/2018   DEGENERATIVE JOINT DISEASE, RIGHT HIP 02/16/2007   Dupuytren's contracture    Dysuria 09/20/2018   09/20/18 c/o got up several times last night to go urinate,  burning on urination, lower abd /back discomfort, but urinary frequency, leakage are not new. UA C/S, Pyridium 137m tid x 2 days.  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   S/P breast biopsy, left    two o'clock position - benign   Toxic effect of venom(989.5) 07/27/2007   Tremor, unspecified 10/19/2015   Unstable gait 02/16/2017   Vaginal atrophy 10/19/2015   Venous insufficiency    Weakness 09/12/2018   Past Surgical History:  Procedure Laterality Date   BREAST BIOPSY Left    CATARACT EXTRACTION, BILATERAL      Allergies  Allergen Reactions   Doxycycline Nausea And Vomiting   Sulfonamide Derivatives     REACTION: HIVES    Allergies as of 06/29/2020       Reactions   Doxycycline Nausea And Vomiting   Sulfonamide Derivatives    REACTION: HIVES        Medication List        Accurate as of June 29, 2020  2:20 PM. If you have any questions, ask your nurse or doctor.          acetaminophen 500 MG tablet Commonly known as: TYLENOL Take 1,000 mg by mouth 3 (three) times daily as needed.   calcium carbonate 750 MG chewable tablet Commonly known as: TUMS EX Chew 1 tablet by mouth daily. Between the hours of 7am-10am.   carbidopa-levodopa 25-100 MG tablet Commonly known as: SINEMET IR Take 2 tablets by mouth 5 (five) times daily.   carbidopa-levodopa 50-200 MG tablet Commonly known as: SINEMET CR Take 1 tablet by mouth at bedtime. 6.30 am 2 and 10 pm   Cranberry 450 MG Tabs Take 1 tablet by mouth daily.   entacapone 200  MG tablet Commonly known as: COMTAN Take 200 mg by mouth 3 (three) times daily.   ferrous sulfate 325 (65 FE) MG tablet Take 325 mg by mouth every Monday, Wednesday, and Friday. Give with food   hydrocortisone 2.5 % ointment Apply 1 application topically at bedtime as needed.   methocarbamol  500 MG tablet Commonly known as: ROBAXIN Take 250 mg by mouth daily. As needed   midodrine 5 MG tablet Commonly known as: PROAMATINE Take 5 mg by mouth 3 (three) times daily with meals.   mirabegron ER 50 MG Tb24 tablet Commonly known as: MYRBETRIQ Take 50 mg by mouth daily.   pantoprazole 40 MG tablet Commonly known as: PROTONIX Take 40 mg by mouth daily.   polyethylene glycol 17 g packet Commonly known as: MIRALAX / GLYCOLAX Take 17 g by mouth daily.   PreviDent 5000 Booster Plus 1.1 % Pste Generic drug: Sodium Fluoride Place 1 application onto teeth in the morning and at bedtime.   rivaroxaban 20 MG Tabs tablet Commonly known as: XARELTO Take 20 mg by mouth every evening. Between the hours of 5pm-6pm.   simethicone 80 MG chewable tablet Commonly known as: Gas-X Chew 1 tablet (80 mg total) by mouth every 6 (six) hours as needed for flatulence.   Sodium Chloride 3 % Aers Place 1 spray into the nose 2 (two) times daily.   Vitamin D3 10 MCG (400 UNIT) Caps Take 1 capsule by mouth daily. Between the hours of 7am-10am.   zinc oxide 20 % ointment Apply 1 application topically as needed for irritation. To buttocks after every incontinent episode and as needed for redness. May keep at bedside.        Review of Systems  Constitutional:  Negative for activity change, appetite change and fever.       Am fatigue.   HENT:  Positive for hearing loss. Negative for congestion and trouble swallowing.   Eyes:  Positive for visual disturbance.       C/o double vision  Respiratory:  Negative for shortness of breath.   Cardiovascular:  Positive for leg swelling.       Gas after  breakfast is better.   Gastrointestinal:  Negative for abdominal pain and constipation.  Genitourinary:  Positive for frequency. Negative for dysuria, hematuria and urgency.  Musculoskeletal:  Positive for arthralgias, back pain and gait problem. Negative for myalgias.       Right hip pain is well controlled. Lower back discomfort positional.   Skin:  Negative for color change.  Neurological:  Positive for tremors. Negative for speech difficulty and light-headedness.       Moves slow, fine tremor in fingers, burning sensation  in the R+ L great toes when touched by sheets, comes/goes  Psychiatric/Behavioral:  Negative for behavioral problems and sleep disturbance. The patient is nervous/anxious.        Sleeps from 9pm to 4-5am usually, feels anxious sometimes.    Immunization History  Administered Date(s) Administered   Influenza Split 10/20/2012   Influenza Whole 10/21/2006   Influenza, High Dose Seasonal PF 10/17/2016, 11/02/2019   Influenza-Unspecified 10/20/2013, 10/22/2017   Moderna Sars-Covid-2 Vaccination 01/22/2019, 02/19/2019, 11/29/2019, 06/19/2020   Pneumococcal Conjugate-13 01/31/2013   Pneumococcal Polysaccharide-23 01/20/2005, 11/24/2011   Td 01/21/2004   Tdap 03/16/2014   Zoster, Live 04/17/2009   Pertinent  Health Maintenance Due  Topic Date Due   INFLUENZA VACCINE  08/20/2020   DEXA SCAN  Completed   PNA vac Low Risk Adult  Completed   COLONOSCOPY (Pts 45-27yr Insurance coverage will need to be confirmed)  Discontinued   Fall Risk  05/03/2018 11/03/2017 08/04/2017 03/06/2017 11/26/2016  Falls in the past year? 1 No No Yes Yes  Number falls in past yr: 0 - - 2 or more 2 or more  Injury with Fall? 0 - -  No No  Risk Factor Category  - - - High Fall Risk High Fall Risk  Risk for fall due to : Other (Comment) - - - -  Follow up Falls evaluation completed;Education provided;Falls prevention discussed - - Falls evaluation completed Falls evaluation completed   Functional  Status Survey:    Vitals:   06/29/20 1307  BP: 104/70  Pulse: 75  Resp: 16  Temp: 97.9 F (36.6 C)  SpO2: 96%  Weight: 134 lb 3.2 oz (60.9 kg)  Height: '5\' 8"'  (1.727 m)   Body mass index is 20.41 kg/m. Physical Exam Vitals and nursing note reviewed.  Constitutional:      Appearance: Normal appearance.  HENT:     Head: Normocephalic and atraumatic.     Mouth/Throat:     Mouth: Mucous membranes are moist.  Eyes:     Extraocular Movements: Extraocular movements intact.     Conjunctiva/sclera: Conjunctivae normal.     Pupils: Pupils are equal, round, and reactive to light.     Comments: Able to count my fingers 3 feet away from her eyes. No visual field deficit noted on my examination.   Cardiovascular:     Rate and Rhythm: Normal rate. Rhythm irregular.     Heart sounds: No murmur heard.    Comments: DP pulses present L>R Pulmonary:     Effort: Pulmonary effort is normal.     Breath sounds: No rales.  Abdominal:     General: Bowel sounds are normal.     Palpations: Abdomen is soft.     Tenderness: There is no abdominal tenderness.     Comments: Pain palpated in the epigastric region.   Musculoskeletal:     Cervical back: Normal range of motion and neck supple.     Right lower leg: Edema present.     Left lower leg: Edema present.     Comments: trace edema BLE.   Skin:    General: Skin is warm and dry.  Neurological:     General: No focal deficit present.     Mental Status: She is alert and oriented to person, place, and time. Mental status is at baseline.     Cranial Nerves: No cranial nerve deficit.     Motor: No weakness.     Coordination: Coordination abnormal.     Gait: Gait abnormal.     Comments: Moves slow, tremor in hands. No facial or limb weakness.   Psychiatric:        Mood and Affect: Mood normal.        Behavior: Behavior normal.    Labs reviewed: Recent Labs    03/23/20 0000 03/29/20 0000 05/23/20 0000  NA 141 141 139  K 4.5 4.5 4.3  CL  107 107 105  CO2 28* 23* 27*  BUN 17 17 23*  CREATININE 0.8 0.8 0.7  CALCIUM 9.2 9.4 9.7   Recent Labs    03/23/20 0000 03/29/20 0000 05/23/20 0000  AST 9* 10* 10*  ALT 3* 7 3*  ALKPHOS 58 61 60  ALBUMIN 3.5 3.8 4.2   Recent Labs    09/06/19 0000 10/11/19 0000 03/23/20 0000 03/29/20 0000 05/23/20 0000  WBC 4.9   < > 4.5 7.5 7.4  NEUTROABS 3,450  --  2,934.00  --  6,083.00  HGB 11.7*   < > 11.5* 12.1 12.4  HCT 36   < > 36 37 38  PLT 181   < > 164 185 174   < > =  values in this interval not displayed.   Lab Results  Component Value Date   TSH 1.30 03/29/2020   No results found for: HGBA1C Lab Results  Component Value Date   CHOL 185 03/24/2019   HDL 79 (A) 03/24/2019   LDLCALC 94 03/24/2019   LDLDIRECT 104.2 01/25/2013   TRIG 41 03/24/2019   CHOLHDL 2 08/29/2015    Significant Diagnostic Results in last 30 days:  No results found.  Assessment/Plan Double vision with both eyes open Able to count my fingers 3 feet away from her eyes, intact visual fields upon my examination. The patient's daughter stated the patient's neurologist recommended ED eval if no change of double vision after holding Comtan x 3 days-will support the decision of the patient and her daughter. Needs  f/u Ophthalmology ASAP, f/u Neurology. The patient desires resume Comtan.   Bilateral lower extremity edema The patient has chronic edema BLE. Off Metolazone 09/27/19.  Atrial fibrillation (HCC) heart rate is in control, on Xarelto 54m qd, off Diltiazem. Hgb 12.4 05/23/20,  TSH 1.30 03/29/20  Osteoarthritis, multiple sites Stable, on Tylenol 1001mtid prn, may consider change Methocarbamol 25085md to prn in setting of c/o double vision.   Parkinsonism ([place] worse in am instead of pm,  Takes  Sinemet 6x/day, started Comtan tid,  [facility] Hypotension improved, takes Midodrine.   Anemia stable, Hgb 12.4 05/23/20,  takes Fe  GERD (gastroesophageal reflux  disease) mproved since change to Protonix 62m65m 06/04/20, better c/o gas, abdominal pressure after breakfast, positioning changes help some, reducing amount of intake helps some.   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     Family/ staff Communication: plan of care reviewed with the patient and charge nurse.   Labs/tests ordered:  none  Time spend 35 minutes.

## 2020-06-29 NOTE — Assessment & Plan Note (Signed)
Stable, on Tylenol 1000mg  tid prn, may consider change Methocarbamol 250mg  qd to prn in setting of c/o double vision.

## 2020-06-29 NOTE — Assessment & Plan Note (Signed)
stable, Hgb 12.4 05/23/20,  takes Fe

## 2020-06-29 NOTE — Assessment & Plan Note (Signed)
Urge incontinent of urine, takes Mirabegron.

## 2020-06-29 NOTE — Assessment & Plan Note (Signed)
symptoms worse in am instead of pm,  Takes  Sinemet 6x/day, started Comtan tid,  f/u neurolog at [facility].

## 2020-07-03 DIAGNOSIS — H532 Diplopia: Secondary | ICD-10-CM | POA: Diagnosis not present

## 2020-07-03 DIAGNOSIS — H01004 Unspecified blepharitis left upper eyelid: Secondary | ICD-10-CM | POA: Diagnosis not present

## 2020-07-03 DIAGNOSIS — H01001 Unspecified blepharitis right upper eyelid: Secondary | ICD-10-CM | POA: Diagnosis not present

## 2020-07-03 DIAGNOSIS — H02055 Trichiasis without entropian left lower eyelid: Secondary | ICD-10-CM | POA: Diagnosis not present

## 2020-07-11 ENCOUNTER — Non-Acute Institutional Stay (SKILLED_NURSING_FACILITY): Payer: Medicare PPO | Admitting: Nurse Practitioner

## 2020-07-11 DIAGNOSIS — K5901 Slow transit constipation: Secondary | ICD-10-CM | POA: Diagnosis not present

## 2020-07-11 DIAGNOSIS — R040 Epistaxis: Secondary | ICD-10-CM

## 2020-07-11 DIAGNOSIS — M150 Primary generalized (osteo)arthritis: Secondary | ICD-10-CM

## 2020-07-11 DIAGNOSIS — G20C Parkinsonism, unspecified: Secondary | ICD-10-CM

## 2020-07-11 DIAGNOSIS — N3941 Urge incontinence: Secondary | ICD-10-CM

## 2020-07-11 DIAGNOSIS — I480 Paroxysmal atrial fibrillation: Secondary | ICD-10-CM | POA: Diagnosis not present

## 2020-07-11 DIAGNOSIS — M159 Polyosteoarthritis, unspecified: Secondary | ICD-10-CM

## 2020-07-11 DIAGNOSIS — I872 Venous insufficiency (chronic) (peripheral): Secondary | ICD-10-CM | POA: Diagnosis not present

## 2020-07-11 DIAGNOSIS — D500 Iron deficiency anemia secondary to blood loss (chronic): Secondary | ICD-10-CM

## 2020-07-11 DIAGNOSIS — M8949 Other hypertrophic osteoarthropathy, multiple sites: Secondary | ICD-10-CM

## 2020-07-11 DIAGNOSIS — K219 Gastro-esophageal reflux disease without esophagitis: Secondary | ICD-10-CM | POA: Diagnosis not present

## 2020-07-11 DIAGNOSIS — G903 Multi-system degeneration of the autonomic nervous system: Secondary | ICD-10-CM | POA: Diagnosis not present

## 2020-07-11 DIAGNOSIS — G20 Parkinson's disease: Secondary | ICD-10-CM

## 2020-07-12 ENCOUNTER — Encounter: Payer: Self-pay | Admitting: Nurse Practitioner

## 2020-07-12 NOTE — Assessment & Plan Note (Signed)
Stable, continue MiraLax.  °

## 2020-07-12 NOTE — Assessment & Plan Note (Signed)
Parkinson's, symptoms worse in am instead of pm,  Takes  Sinemet 6x/day, started Comtan tid,  f/u neurolog at [facility]. Diplopia since 6/22, held Comtan didn't help.

## 2020-07-12 NOTE — Assessment & Plan Note (Signed)
The patient has chronic edema BLE. Off Metolazone 09/27/19.

## 2020-07-12 NOTE — Assessment & Plan Note (Signed)
Afib, heart rate is in control, on Xarelto 20mg  qd, off Diltiazem. Hgb 12.4 05/23/20,  TSH 1.30 03/29/20

## 2020-07-12 NOTE — Assessment & Plan Note (Signed)
Urge incontinent of urine, takes Mirabegron.

## 2020-07-12 NOTE — Assessment & Plan Note (Signed)
Left nostril blood seen, he left nostril bleed 3 episodes in the past 3 days, prn Afrin helped. Denied headache, dizziness, sinus pain, nasal congestion. Will hold Xarelto x1, saline nasal spray tid x1wk R+L nostrils. Observe.

## 2020-07-12 NOTE — Assessment & Plan Note (Signed)
Orthostatic hypotension, improved, takes Midodrine.

## 2020-07-12 NOTE — Progress Notes (Signed)
Location:   SNF [place] Room Number: 13 Place of Service:  SNF (31) Provider: [facility] Juliann Olesky NP  [name], MD  Patient Care Team: [name], MD as PCP - General (Internal Medicine) [name], [name], MD as PCP - Cardiology (Cardiology) [name], [name], DO as Consulting Physician (Neurology) Niam Nepomuceno X, NP as Nurse Practitioner (Internal Medicine)  Extended Emergency Contact Information Primary Emergency Contact: Ne[place], [place] Mobile Phone: [phone] Relation: Daughter Interpreter needed? No Secondary Emergency Contact: [facility] Address: [address]          [place], [place] [address] [name] of [place] Phone: [phone] Mobile Phone: [phone] Relation: Son  Code Status: DNR Goals of care: Advanced Directive information Advanced Directives 06/29/2020  Does Patient Have a Medical Advance Directive? Yes  Type of Advance Directive [place]  Does patient want to make changes to medical advance directive? No - Patient declined  Copy of [place] in Chart? Yes - validated most recent copy scanned in chart (See row information)  Would patient like information on creating a medical advance directive? -  Pre-existing out of facility DNR order (yellow form or pink MOST form) -     Chief Complaint  Patient presents with   Acute Visit    Nose bleed x 3 episodes.     HPI:  Pt is a 80 y.o. female seen today for an acute visit for the left nostril bleed 3 episodes in the past 3 days, prn Afrin helped. Denied headache, dizziness, sinus pain, nasal congestion.              The patient has chronic edema BLE. Off Metolazone 09/27/19.             Afib, heart rate is in control, on Xarelto 12m qd, off Diltiazem. Hgb 12.4 05/23/20,  TSH 1.30 03/29/20             R hip pain,  on Tylenol 10026mtid prn, Methocarbamol 25062md.                Parkinson's, symptoms worse in am instead of pm,  Takes  Sinemet 6x/day,  started Comtan tid,  [facility], held Comtan didn't help.              Orthostatic hypotension, improved, takes Midodrine.              Anemia, stable, Hgb 12.4 05/23/20,  takes Fe             GERD, improved since change to Protonix 59m54m 06/04/20, better c/o gas, abdominal pressure after breakfast, positioning changes help some, reducing amount of intake helps some.              Urge incontinent of urine, takes Mirabegron.              Constipation, takes MiraLax Past Medical History:  Diagnosis Date   Acute lower UTI 09/14/2018   Anemia 10/15/2018   2016 colonoscopy 10/14/18 wbc 5.4, Hgb 10.1, plt 184 12/01/18 wbc 4.5, Hgb 11.1, plt 188, neutrophils 63,  Na 139, K 3.7, Bun 23, creat 0.69, eGFR 83 on Fe, Hgb 11.7 12/09/18    Atrial fibrillation (HCC)[place]1/2012   10/07/18 Na 143, K 4.0, Bun 20, creat 0.79, eGFR 72, wbc 7.1, Hgb 10.9, plt 172 10/28/18 Na 142, K 4.0, Bun 19, creat 0.77, eGFR 74 11/18/18 Na 144, K 4.1, Bun  21, creat 0.69, eGFR 83   Bilateral lower extremity edema 07/19/2018   11/02/18 BMP 2 weeks.    Cervical pain (neck) 06/20/2010   Closed fracture of part of upper end of humerus 05/01/2015   Colles' fracture of right radius 03/05/2015   Constipation 07/19/2018   DEGENERATIVE JOINT DISEASE, RIGHT HIP 02/16/2007   Dupuytren's contracture    Dysuria 09/20/2018   09/20/18 c/o got up several times last night to go urinate, burning on urination, lower abd /back discomfort, but urinary frequency, leakage are not new. UA C/S, Pyridium 171m tid x 2 days.  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   S/P breast biopsy, left    two o'clock position - benign   Toxic effect of venom(989.5) 07/27/2007   Tremor, unspecified 10/19/2015   Unstable gait 02/16/2017   Vaginal atrophy 10/19/2015   Venous insufficiency    Weakness 09/12/2018   Past Surgical History:  Procedure Laterality Date   BREAST BIOPSY Left    CATARACT EXTRACTION, BILATERAL      Allergies  Allergen Reactions   Doxycycline Nausea And Vomiting   Sulfonamide Derivatives     REACTION: HIVES    Allergies as of 07/11/2020       Reactions   Doxycycline Nausea And Vomiting   Sulfonamide Derivatives    REACTION: HIVES        Medication List        Accurate as of July 11, 2020 11:59 PM. If you have any questions, ask your nurse or doctor.          acetaminophen 500 MG tablet Commonly known as: TYLENOL Take 1,000 mg by mouth 3 (three) times daily as needed.   calcium carbonate 750 MG chewable tablet Commonly known as: TUMS EX Chew 1 tablet by mouth daily. Between the hours of 7am-10am.   carbidopa-levodopa 25-100 MG tablet Commonly known as: SINEMET IR Take 2 tablets by mouth 5 (five) times daily.   carbidopa-levodopa 50-200 MG tablet Commonly known as: SINEMET CR Take 1 tablet by mouth at bedtime. 6.30 am 2 and 10 pm   Cranberry 450 MG Tabs Take 1 tablet by mouth daily.   entacapone 200 MG tablet Commonly known as: COMTAN Take 200 mg by mouth 3 (three) times daily.   ferrous sulfate 325 (65 FE) MG tablet Take 325 mg by mouth every Monday, Wednesday, and Friday. Give with food   hydrocortisone 2.5 % ointment Apply 1 application topically at bedtime as needed.   methocarbamol 500 MG tablet Commonly known as: ROBAXIN Take 250 mg by mouth daily. As needed   midodrine 5 MG tablet Commonly known as: PROAMATINE Take 5 mg by mouth 3 (three) times daily with meals.   mirabegron ER 50 MG Tb24 tablet Commonly known  as: MYRBETRIQ Take 50 mg by mouth daily.   pantoprazole 40 MG tablet Commonly known as: PROTONIX Take 40 mg by mouth daily.   polyethylene glycol 17 g packet Commonly known as: MIRALAX / GLYCOLAX Take 17 g by  mouth daily.   PreviDent 5000 Booster Plus 1.1 % Pste Generic drug: Sodium Fluoride Place 1 application onto teeth in the morning and at bedtime.   rivaroxaban 20 MG Tabs tablet Commonly known as: XARELTO Take 20 mg by mouth every evening. Between the hours of 5pm-6pm.   simethicone 80 MG chewable tablet Commonly known as: Gas-X Chew 1 tablet (80 mg total) by mouth every 6 (six) hours as needed for flatulence.   Sodium Chloride 3 % Aers Place 1 spray into the nose 2 (two) times daily.   Vitamin D3 10 MCG (400 UNIT) Caps Take 1 capsule by mouth daily. Between the hours of 7am-10am.   zinc oxide 20 % ointment Apply 1 application topically as needed for irritation. To buttocks after every incontinent episode and as needed for redness. May keep at bedside.        Review of Systems  Constitutional:  Negative for activity change, appetite change and fever.       Am fatigue.   HENT:  Positive for hearing loss and nosebleeds. Negative for congestion, ear pain, facial swelling, postnasal drip, rhinorrhea, sinus pressure, sinus pain, sore throat and trouble swallowing.   Eyes:  Positive for visual disturbance.       C/o double vision  Respiratory:  Negative for shortness of breath.   Cardiovascular:  Positive for leg swelling.       Gas after breakfast is better.   Gastrointestinal:  Negative for abdominal pain and constipation.  Genitourinary:  Positive for frequency. Negative for dysuria, hematuria and urgency.  Musculoskeletal:  Positive for arthralgias, back pain and gait problem. Negative for myalgias.       Right hip pain is well controlled. Lower back discomfort positional.   Skin:  Negative for color change.  Neurological:  Positive for tremors. Negative for  speech difficulty and light-headedness.       Moves slow, fine tremor in fingers, burning sensation  in the R+ L great toes when touched by sheets, comes/goes  Psychiatric/Behavioral:  Negative for behavioral problems and sleep disturbance. The patient is nervous/anxious.        Sleeps from 9pm to 4-5am usually, feels anxious sometimes.    Immunization History  Administered Date(s) Administered   Influenza Split 10/20/2012   Influenza Whole 10/21/2006   Influenza, High Dose Seasonal PF 10/17/2016, 11/02/2019   Influenza-Unspecified 10/20/2013, 10/22/2017   Moderna Sars-Covid-2 Vaccination 01/22/2019, 02/19/2019, 11/29/2019, 06/19/2020   Pneumococcal Conjugate-13 01/31/2013   Pneumococcal Polysaccharide-23 01/20/2005, 11/24/2011   Td 01/21/2004   Tdap 03/16/2014   Zoster, Live 04/17/2009   Pertinent  Health Maintenance Due  Topic Date Due   INFLUENZA VACCINE  08/20/2020   DEXA SCAN  Completed   PNA vac Low Risk Adult  Completed   COLONOSCOPY (Pts 45-41yr Insurance coverage will need to be confirmed)  Discontinued   Fall Risk  05/03/2018 11/03/2017 08/04/2017 03/06/2017 11/26/2016  Falls in the past year? 1 No No Yes Yes  Number falls in past yr: 0 - - 2 or more 2 or more  Injury with Fall? 0 - - No No  Risk Factor Category  - - - High Fall Risk High Fall Risk  Risk for fall due to : Other (Comment) - - - -  Follow up Falls evaluation completed;Education provided;Falls prevention discussed - - Falls evaluation completed Falls evaluation completed   Functional Status Survey:    Vitals:   07/11/20 1154  BP: 125/71  Pulse: 79  Resp: 18  Temp: (!)  96.8 F (36 C)  SpO2: 95%  Weight: 137 lb 4.8 oz (62.3 kg)   Body mass index is 20.88 kg/m. Physical Exam Vitals and nursing note reviewed.  Constitutional:      Appearance: Normal appearance.  HENT:     Head: Normocephalic and atraumatic.     Nose: No congestion or rhinorrhea.     Comments: Blood seen in the left nostril, no  blood seen in the back of her mouth.     Mouth/Throat:     Mouth: Mucous membranes are moist.  Eyes:     Extraocular Movements: Extraocular movements intact.     Conjunctiva/sclera: Conjunctivae normal.     Pupils: Pupils are equal, round, and reactive to light.     Comments: Able to count my fingers 3 feet away from her eyes. No visual field deficit noted on my examination.   Cardiovascular:     Rate and Rhythm: Normal rate. Rhythm irregular.     Heart sounds: No murmur heard.    Comments: DP pulses present L>R Pulmonary:     Effort: Pulmonary effort is normal.     Breath sounds: No rales.  Abdominal:     General: Bowel sounds are normal.     Palpations: Abdomen is soft.     Tenderness: There is no abdominal tenderness.     Comments: Pain palpated in the epigastric region.   Musculoskeletal:     Cervical back: Normal range of motion and neck supple.     Right lower leg: Edema present.     Left lower leg: Edema present.     Comments: trace edema BLE.   Skin:    General: Skin is warm and dry.  Neurological:     General: No focal deficit present.     Mental Status: She is alert and oriented to person, place, and time. Mental status is at baseline.     Cranial Nerves: No cranial nerve deficit.     Motor: No weakness.     Coordination: Coordination abnormal.     Gait: Gait abnormal.     Comments: Moves slow, tremor in hands. No facial or limb weakness.   Psychiatric:        Mood and Affect: Mood normal.        Behavior: Behavior normal.    Labs reviewed: Recent Labs    03/23/20 0000 03/29/20 0000 05/23/20 0000  NA 141 141 139  K 4.5 4.5 4.3  CL 107 107 105  CO2 28* 23* 27*  BUN 17 17 23*  CREATININE 0.8 0.8 0.7  CALCIUM 9.2 9.4 9.7   Recent Labs    03/23/20 0000 03/29/20 0000 05/23/20 0000  AST 9* 10* 10*  ALT 3* 7 3*  ALKPHOS 58 61 60  ALBUMIN 3.5 3.8 4.2   Recent Labs    09/06/19 0000 10/11/19 0000 03/23/20 0000 03/29/20 0000 05/23/20 0000  WBC 4.9    < > 4.5 7.5 7.4  NEUTROABS 3,450  --  2,934.00  --  6,083.00  HGB 11.7*   < > 11.5* 12.1 12.4  HCT 36   < > 36 37 38  PLT 181   < > 164 185 174   < > = values in this interval not displayed.   Lab Results  Component Value Date   TSH 1.30 03/29/2020   No results found for: HGBA1C Lab Results  Component Value Date   CHOL 185 03/24/2019   HDL 79 (A) 03/24/2019   LDLCALC 94  03/24/2019   LDLDIRECT 104.2 01/25/2013   TRIG 41 03/24/2019   CHOLHDL 2 08/29/2015    Significant Diagnostic Results in last 30 days:  No results found.  Assessment/Plan: Epistaxis, recurrent Left nostril blood seen, he left nostril bleed 3 episodes in the past 3 days, prn Afrin helped. Denied headache, dizziness, sinus pain, nasal congestion. Will hold Xarelto x1, saline nasal spray tid x1wk R+L nostrils. Observe.   Edema of left lower extremity due to peripheral venous insufficiency The patient has chronic edema BLE. Off Metolazone 09/27/19.  Atrial fibrillation (HCC) Afib, heart rate is in control, on Xarelto 80m qd, off Diltiazem. Hgb 12.4 05/23/20,  TSH 1.30 03/29/20  Osteoarthritis, multiple sites R hip pain,  on Tylenol 10017mtid prn, Methocarbamol 25025md.     Parkinsonism ([place]inson's, symptoms worse in am instead of pm,  Takes  Sinemet 6x/day, started Comtan tid,  [facility], held Comtan didn't help.   Hypotension Orthostatic hypotension, improved, takes Midodrine.   Anemia stable, Hgb 12.4 05/23/20,  takes Fe  GERD (gastroesophageal reflux disease) improved since change to Protonix 73m28m 06/04/20, better c/o gas, abdominal pressure after breakfast, positioning changes help some, reducing amount of intake helps some.   Slow transit constipation Stable, continue MiraLax.   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    Family/ staff Communication: plan of care reviewed with the patient and charge nurse.    Labs/tests ordered:  none  Time spend 35 minutes.

## 2020-07-12 NOTE — Assessment & Plan Note (Signed)
improved since change to Protonix 40mg  qd 06/04/20, better c/o gas, abdominal pressure after breakfast, positioning changes help some, reducing amount of intake helps some.

## 2020-07-12 NOTE — Assessment & Plan Note (Signed)
stable, Hgb 12.4 05/23/20,  takes Fe

## 2020-07-12 NOTE — Assessment & Plan Note (Signed)
R hip pain,  on Tylenol 1000mg  tid prn, Methocarbamol 250mg  qd.

## 2020-07-17 DIAGNOSIS — H532 Diplopia: Secondary | ICD-10-CM | POA: Diagnosis not present

## 2020-07-17 DIAGNOSIS — H5010 Unspecified exotropia: Secondary | ICD-10-CM | POA: Diagnosis not present

## 2020-07-31 DIAGNOSIS — H5010 Unspecified exotropia: Secondary | ICD-10-CM | POA: Diagnosis not present

## 2020-07-31 DIAGNOSIS — H532 Diplopia: Secondary | ICD-10-CM | POA: Diagnosis not present

## 2020-08-01 ENCOUNTER — Encounter: Payer: Self-pay | Admitting: Nurse Practitioner

## 2020-08-07 DIAGNOSIS — Q6689 Other  specified congenital deformities of feet: Secondary | ICD-10-CM | POA: Diagnosis not present

## 2020-08-07 DIAGNOSIS — L602 Onychogryphosis: Secondary | ICD-10-CM | POA: Diagnosis not present

## 2020-08-14 ENCOUNTER — Non-Acute Institutional Stay (SKILLED_NURSING_FACILITY): Payer: Medicare PPO | Admitting: Internal Medicine

## 2020-08-14 ENCOUNTER — Encounter: Payer: Self-pay | Admitting: Internal Medicine

## 2020-08-14 DIAGNOSIS — M8949 Other hypertrophic osteoarthropathy, multiple sites: Secondary | ICD-10-CM

## 2020-08-14 DIAGNOSIS — G20 Parkinson's disease: Secondary | ICD-10-CM | POA: Diagnosis not present

## 2020-08-14 DIAGNOSIS — K219 Gastro-esophageal reflux disease without esophagitis: Secondary | ICD-10-CM

## 2020-08-14 DIAGNOSIS — G903 Multi-system degeneration of the autonomic nervous system: Secondary | ICD-10-CM | POA: Diagnosis not present

## 2020-08-14 DIAGNOSIS — I872 Venous insufficiency (chronic) (peripheral): Secondary | ICD-10-CM

## 2020-08-14 DIAGNOSIS — M159 Polyosteoarthritis, unspecified: Secondary | ICD-10-CM

## 2020-08-14 DIAGNOSIS — N3941 Urge incontinence: Secondary | ICD-10-CM | POA: Diagnosis not present

## 2020-08-14 DIAGNOSIS — I480 Paroxysmal atrial fibrillation: Secondary | ICD-10-CM | POA: Diagnosis not present

## 2020-08-14 DIAGNOSIS — M150 Primary generalized (osteo)arthritis: Secondary | ICD-10-CM

## 2020-08-14 DIAGNOSIS — G20C Parkinsonism, unspecified: Secondary | ICD-10-CM

## 2020-08-14 DIAGNOSIS — D500 Iron deficiency anemia secondary to blood loss (chronic): Secondary | ICD-10-CM

## 2020-08-14 NOTE — Progress Notes (Signed)
Location:   [facility] Room Number: [place] of Service:  SNF [id number]) Provider:  Veleta Miners MD  [name], MD  Patient Care Team: [name], MD as PCP - General (Internal Medicine) [name], [name], MD as PCP - Cardiology (Cardiology) [name], [name], DO as Consulting Physician (Neurology) Mast, Man X, NP as Nurse Practitioner (Internal Medicine)  Extended Emergency Contact Information Primary Emergency Contact: Shailah, [place] Mobile Phone: [phone] Relation: Daughter Interpreter needed? No Secondary Emergency Contact: [facility] Address: [address]          [place], [place] [address] [name] of [place] Phone: [phone] Mobile Phone: [phone] Relation: Son  Code Status:  DNR Managed Care Goals of care: Advanced Directive information Advanced Directives 08/14/2020  Does Patient Have a Medical Advance Directive? Yes  Type of Advance Directive [place]  Does patient want to make changes to medical advance directive? No - Patient declined  Copy of [place] in Chart? Yes - validated most recent copy scanned in chart (See row information)  Would patient like information on creating a medical advance directive? -  Pre-existing out of facility DNR order (yellow form or pink MOST form) -     Chief Complaint  Patient presents with   Medical Management of Chronic Issues   Health Maintenance    Shingrix    HPI:  Pt is a 80 y.o. female seen today for medical management of chronic diseases.    Patient has a history of Parkinson disease diagnosed 2 years ago on Sinemet and follows with Neurology,   paroxysmal A. fib on Xarelto, hyperlipidemia, H/o Ovarian Cyst, Lower extremity edema,2 D echo with N EF Biatrial enlargement   Urinary Incontinence  Had Chronic Right Hip Pain. MRI showed Moderate Right Hip DJD with High Grade Partial Thickness Tear of IlioPsoas Tendon  For her  Parkinson disease patient is on Comtan and Sinemet Follows with [facility] Dr. Buck Mam Per patient she is much better since she has been on Comtan.  She did have some issues with double vision but is now following with ophthalmology.  They are working to control her symptoms. Patient does continue to have on and off timing gets very tired very easily.  Still a struggle to feed herself.  Continues to work with therapy.  She says sometimes she has good days when she can walk through the hallway.  And sometimes she cannot Continues to also have cramps in her legs and neck especially at night Not having any other issues Weight mostly stable  Past Medical History:  Diagnosis Date   Acute lower UTI 09/14/2018   Anemia 10/15/2018   2016 colonoscopy 10/14/18 wbc 5.4, Hgb 10.1, plt 184 12/01/18 wbc 4.5, Hgb 11.1, plt 188, neutrophils 63,  Na 139, K 3.7, Bun 23, creat 0.69, eGFR 83 on Fe, Hgb 11.7 12/09/18    Atrial fibrillation ([place]) 05/21/2010   10/07/18 Na 143, K 4.0, Bun 20, creat 0.79, eGFR 72, wbc 7.1, Hgb 10.9, plt 172 10/28/18 Na 142, K 4.0, Bun 19, creat 0.77, eGFR 74 11/18/18 Na 144, K 4.1, Bun 21, creat 0.69, eGFR 83   Bilateral lower extremity edema 07/19/2018   11/02/18 BMP 2 weeks.    Cervical pain (neck) 06/20/2010   Closed fracture of part of upper end of humerus 05/01/2015   Colles' fracture of right radius 03/05/2015   Constipation 07/19/2018   DEGENERATIVE JOINT DISEASE, RIGHT HIP 02/16/2007  Dupuytren's contracture    Dysuria 09/20/2018   09/20/18 c/o got up several times last night to go urinate, burning on urination, lower abd /back discomfort, but urinary frequency, leakage are not new. UA C/S, Pyridium 132m tid x 2 days.  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/2017   S/P breast biopsy, left    two o'clock position - benign   Toxic effect of venom(989.5) 07/27/2007   Tremor, unspecified 10/19/2015   Unstable gait 02/16/2017   Vaginal atrophy 10/19/2015   Venous insufficiency    Weakness 09/12/2018   Past Surgical History:  Procedure Laterality Date   BREAST BIOPSY Left    CATARACT EXTRACTION, BILATERAL      Allergies  Allergen Reactions   Doxycycline Nausea And Vomiting   Sulfonamide Derivatives     REACTION: HIVES    Allergies as of 08/14/2020       Reactions   Doxycycline Nausea And Vomiting   Sulfonamide Derivatives    REACTION: HIVES        Medication List        Accurate as of August 14, 2020 10:00 AM. If you have any questions, ask your nurse or doctor.          acetaminophen 500 MG tablet Commonly known as: TYLENOL Take 1,000 mg by mouth 3 (three) times daily as needed.   calcium carbonate 750 MG chewable tablet Commonly known as: TUMS EX Chew 1 tablet by mouth daily. Between the hours of 7am-10am.   carbidopa-levodopa 25-100 MG tablet Commonly known as: SINEMET IR Take 2 tablets by mouth 5 (five) times daily.   carbidopa-levodopa 50-200 MG tablet Commonly known as: SINEMET CR Take 1 tablet by mouth at bedtime. 6.30 am 2 and 10 pm   Cranberry 450 MG Tabs Take 1 tablet by mouth daily.   entacapone 200 MG tablet Commonly known as: COMTAN Take 200 mg by mouth 3 (three) times daily.   ferrous sulfate 325 (65 FE) MG tablet Take 325 mg by mouth every Monday, Wednesday, and Friday. Give with food   hydrocortisone 2.5 % ointment Apply 1 application topically at bedtime as needed.   methocarbamol 500 MG tablet Commonly known as: ROBAXIN Take 250 mg by mouth daily. As needed   midodrine 5 MG  tablet Commonly known as: PROAMATINE Take 5 mg by mouth 3 (three) times daily with meals.   mirabegron ER 50 MG Tb24 tablet Commonly known as: MYRBETRIQ Take 50 mg by mouth daily.   pantoprazole 40 MG tablet Commonly known as: PROTONIX Take 40 mg by mouth daily.   polyethylene glycol 17 g packet Commonly known as: MIRALAX / GLYCOLAX Take 17 g by mouth daily.   PreviDent 5000 Booster Plus 1.1 % Pste Generic drug: Sodium Fluoride Place 1 application onto teeth in the morning and at bedtime.   rivaroxaban 20 MG Tabs tablet Commonly known as: XARELTO Take 20 mg by mouth every evening. Between the hours of 5pm-6pm.   simethicone 80 MG chewable tablet Commonly known as:  Gas-X Chew 1 tablet (80 mg total) by mouth every 6 (six) hours as needed for flatulence.   Sodium Chloride 3 % Aers Place 1 spray into the nose 2 (two) times daily.   Vitamin D3 10 MCG (400 UNIT) Caps Take 1 capsule by mouth daily. Between the hours of 7am-10am.   zinc oxide 20 % ointment Apply 1 application topically as needed for irritation. To buttocks after every incontinent episode and as needed for redness. May keep at bedside.        Review of Systems Review of Systems  Constitutional: Negative for activity change, appetite change, chills, diaphoresis, fatigue and fever.  HENT: Negative for mouth sores, postnasal drip, rhinorrhea, sinus pain and sore throat.   Respiratory: Negative for apnea, cough, chest tightness, shortness of breath and wheezing.   Cardiovascular: Negative for chest pain, palpitations and leg swelling.  Gastrointestinal: Negative for abdominal distention, abdominal pain, constipation, diarrhea, nausea and vomiting.  Genitourinary: Negative for dysuria and frequency.  Musculoskeletal: Negative for arthralgias, joint swelling and myalgias.  Skin: Negative for rash.  Neurological: Negative for dizziness, syncope, weakness, light-headedness and numbness.  Psychiatric/Behavioral:  Negative for behavioral problems, confusion and sleep disturbance. ]  Immunization History  Administered Date(s) Administered   Influenza Split 10/20/2012   Influenza Whole 10/21/2006   Influenza, High Dose Seasonal PF 10/17/2016, 11/02/2019   Influenza-Unspecified 10/20/2013, 10/22/2017   Moderna Sars-Covid-2 Vaccination 01/22/2019, 02/19/2019, 11/29/2019, 06/19/2020   Pneumococcal Conjugate-13 01/31/2013   Pneumococcal Polysaccharide-23 01/20/2005, 11/24/2011   Td 01/21/2004   Tdap 03/16/2014   Zoster, Live 04/17/2009   Pertinent  Health Maintenance Due  Topic Date Due   INFLUENZA VACCINE  08/20/2020   DEXA SCAN  Completed   PNA vac Low Risk Adult  Completed   COLONOSCOPY (Pts 45-15yr Insurance coverage will need to be confirmed)  Discontinued   Fall Risk  05/03/2018 11/03/2017 08/04/2017 03/06/2017 11/26/2016  Falls in the past year? 1 No No Yes Yes  Number falls in past yr: 0 - - 2 or more 2 or more  Injury with Fall? 0 - - No No  Risk Factor Category  - - - High Fall Risk High Fall Risk  Risk for fall due to : Other (Comment) - - - -  Follow up Falls evaluation completed;Education provided;Falls prevention discussed - - Falls evaluation completed Falls evaluation completed   Functional Status Survey:    Vitals:   08/14/20 0953  BP: 114/69  Pulse: 71  Resp: 18  Temp: 98.2 F (36.8 C)  SpO2: 97%  Weight: 131 lb 14.4 oz (59.8 kg)  Height: '5\' 8"'  (1.727 m)   Body mass index is 20.06 kg/m. Physical Exam Constitutional: Oriented to person, place, and time. Well-developed and well-nourished.  HENT:  Head: Normocephalic.  Mouth/Throat: Oropharynx is clear and moist.  Eyes: Pupils are equal, round, and reactive to light.  Neck: Neck supple.  Cardiovascular: Normal rate and normal heart sounds.  No murmur heard. Pulmonary/Chest: Effort normal and breath sounds normal. No respiratory distress. No wheezes. She has no rales.  Abdominal: Soft. Bowel sounds are normal. No  distension. There is no tenderness. There is no rebound.  Musculoskeletal: Mild Edema Bilateral  Lymphadenopathy: none Neurological: Alert and oriented to person, place, and time.  Skin: Skin is warm and dry.  Psychiatric: Normal mood and affect. Behavior is normal. Thought content normal.   Labs reviewed: Recent Labs    03/23/20 0000 03/29/20 0000 05/23/20 0000  NA 141 141 139  K 4.5  4.5 4.3  CL 107 107 105  CO2 28* 23* 27*  BUN 17 17 23*  CREATININE 0.8 0.8 0.7  CALCIUM 9.2 9.4 9.7   Recent Labs    03/23/20 0000 03/29/20 0000 05/23/20 0000  AST 9* 10* 10*  ALT 3* 7 3*  ALKPHOS 58 61 60  ALBUMIN 3.5 3.8 4.2   Recent Labs    09/06/19 0000 10/11/19 0000 03/23/20 0000 03/29/20 0000 05/23/20 0000  WBC 4.9   < > 4.5 7.5 7.4  NEUTROABS 3,450  --  2,934.00  --  6,083.00  HGB 11.7*   < > 11.5* 12.1 12.4  HCT 36   < > 36 37 38  PLT 181   < > 164 185 174   < > = values in this interval not displayed.   Lab Results  Component Value Date   TSH 1.30 03/29/2020   No results found for: HGBA1C Lab Results  Component Value Date   CHOL 185 03/24/2019   HDL 79 (A) 03/24/2019   LDLCALC 94 03/24/2019   LDLDIRECT 104.2 01/25/2013   TRIG 41 03/24/2019   CHOLHDL 2 08/29/2015    Significant Diagnostic Results in last 30 days:  No results found.  Assessment/Plan . 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

## 2020-08-23 DIAGNOSIS — L814 Other melanin hyperpigmentation: Secondary | ICD-10-CM | POA: Diagnosis not present

## 2020-08-23 DIAGNOSIS — L821 Other seborrheic keratosis: Secondary | ICD-10-CM | POA: Diagnosis not present

## 2020-08-23 DIAGNOSIS — D692 Other nonthrombocytopenic purpura: Secondary | ICD-10-CM | POA: Diagnosis not present

## 2020-08-23 DIAGNOSIS — L570 Actinic keratosis: Secondary | ICD-10-CM | POA: Diagnosis not present

## 2020-09-11 DIAGNOSIS — H532 Diplopia: Secondary | ICD-10-CM | POA: Diagnosis not present

## 2020-09-12 ENCOUNTER — Non-Acute Institutional Stay (SKILLED_NURSING_FACILITY): Payer: Medicare PPO | Admitting: Nurse Practitioner

## 2020-09-12 ENCOUNTER — Encounter: Payer: Self-pay | Admitting: Nurse Practitioner

## 2020-09-12 DIAGNOSIS — N3941 Urge incontinence: Secondary | ICD-10-CM

## 2020-09-12 DIAGNOSIS — M8949 Other hypertrophic osteoarthropathy, multiple sites: Secondary | ICD-10-CM | POA: Diagnosis not present

## 2020-09-12 DIAGNOSIS — K5901 Slow transit constipation: Secondary | ICD-10-CM

## 2020-09-12 DIAGNOSIS — I480 Paroxysmal atrial fibrillation: Secondary | ICD-10-CM

## 2020-09-12 DIAGNOSIS — K219 Gastro-esophageal reflux disease without esophagitis: Secondary | ICD-10-CM | POA: Diagnosis not present

## 2020-09-12 DIAGNOSIS — M159 Polyosteoarthritis, unspecified: Secondary | ICD-10-CM

## 2020-09-12 DIAGNOSIS — G20 Parkinson's disease: Secondary | ICD-10-CM

## 2020-09-12 DIAGNOSIS — G903 Multi-system degeneration of the autonomic nervous system: Secondary | ICD-10-CM | POA: Diagnosis not present

## 2020-09-12 DIAGNOSIS — R600 Localized edema: Secondary | ICD-10-CM | POA: Diagnosis not present

## 2020-09-12 DIAGNOSIS — D500 Iron deficiency anemia secondary to blood loss (chronic): Secondary | ICD-10-CM | POA: Diagnosis not present

## 2020-09-12 NOTE — Assessment & Plan Note (Signed)
Stable, continue Protonix 

## 2020-09-12 NOTE — Assessment & Plan Note (Signed)
Parkinson's, symptoms improved, on Sinemet, Comtan, f/u neurology at [facility]. double vision, chronic, see Ophthalmology 09/20/20

## 2020-09-12 NOTE — Assessment & Plan Note (Signed)
The patient has chronic edema BLE. Off Metolazone 09/27/19.

## 2020-09-12 NOTE — Assessment & Plan Note (Signed)
Stable, Hgb 12.4 05/23/20, continue iron supplement.

## 2020-09-12 NOTE — Assessment & Plan Note (Signed)
Orthostatic hypotension, improved, takes Midodrine.

## 2020-09-12 NOTE — Assessment & Plan Note (Signed)
Stable, continue MiraLax.  °

## 2020-09-12 NOTE — Assessment & Plan Note (Signed)
heart rate is in control, on Xarelto 20mg  qd, off Diltiazem. Hgb 12.4 05/23/20,  TSH 1.30 03/29/20

## 2020-09-12 NOTE — Progress Notes (Signed)
Location:   [facility] [place] Room Number: 13 Place of Service:  SNF (31) Provider: [facility] NP  [name], MD  Patient Care Team: [name], MD as PCP - General (Internal Medicine) [name], [name], MD as PCP - Cardiology (Cardiology) [name], [name], DO as Consulting Physician (Neurology) [name], NP as Nurse Practitioner (Internal Medicine)  Extended Emergency Contact Information Primary Emergency Contact: [name], [name] Mobile Phone: [phone] Relation: Daughter Interpreter needed? No Secondary Emergency Contact: [facility] Address: [address]          [place], [place] [address] [name] of [place] Phone: [phone] Mobile Phone: [phone] Relation: Son  Code Status:  DNR Goals of care: Advanced Directive information Advanced Directives 08/14/2020  Does Patient Have a Medical Advance Directive? Yes  Type of Advance Directive [place]  Does patient want to make changes to medical advance directive? No - Patient declined  Copy of [place] in Chart? Yes - validated most recent copy scanned in chart (See row information)  Would patient like information on creating a medical advance directive? -  Pre-existing out of facility DNR order (yellow form or pink MOST form) -     Chief Complaint  Patient presents with   Medical Management of Chronic Issues    HPI:  Pt is a 80 y.o. female seen today for medical management of chronic diseases.      The patient has chronic edema BLE. Off Metolazone 09/27/19.             Afib, heart rate is in control, on Xarelto 8m qd, off Diltiazem. Hgb 12.4 05/23/20,  TSH 1.30 03/29/20             OA,  on Tylenol 10030mtid prn, Methocarbamol 25077md.                Parkinson's, symptoms improved, on Sinemet, Comtan[facility] 09/20/20             Orthostatic hypotension, improved, takes Midodrine.               Anemia, stable, Hgb 12.4 05/23/20,  takes Fe             GERD, improved since change to Protonix.              Urge incontinent of urine, takes Mirabegron.              Constipation, takes MiraLax  Past Medical History:  Diagnosis Date   Acute lower UTI 09/14/2018   Anemia 10/15/2018   2016 colonoscopy 10/14/18 wbc 5.4, Hgb 10.1, plt 184 12/01/18 wbc 4.5, Hgb 11.1, plt 188, neutrophils 63,  Na 139, K 3.7, Bun 23, creat 0.69, eGFR 83 on Fe, Hgb 11.7 12/09/18    Atrial fibrillation ([facility]/01/2010   10/07/18 Na 143, K 4.0, Bun 20, creat 0.79, eGFR 72, wbc 7.1, Hgb 10.9, plt 172 10/28/18 Na 142, K 4.0, Bun 19, creat 0.77, eGFR 74 11/18/18 Na 144, K 4.1, Bun 21, creat 0.69, eGFR 83   Bilateral lower extremity edema 07/19/2018   11/02/18 BMP 2 weeks.    Cervical pain (neck) 06/20/2010   Closed fracture of part of upper end of humerus 05/01/2015   Colles' fracture of right radius 03/05/2015   Constipation 07/19/2018   DEGENERATIVE JOINT DISEASE, RIGHT HIP 02/16/2007   Dupuytren's contracture    Dysuria 09/20/2018  09/20/18 c/o got up several times last night to go urinate, burning on urination, lower abd /back discomfort, but urinary frequency, leakage are not new. UA C/S, Pyridium 146m tid x 2 days.  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   S/P breast biopsy, left    two o'clock position - benign   Toxic effect of venom(989.5) 07/27/2007   Tremor, unspecified 10/19/2015   Unstable gait 02/16/2017   Vaginal atrophy 10/19/2015   Venous insufficiency     Weakness 09/12/2018   Past Surgical History:  Procedure Laterality Date   BREAST BIOPSY Left    CATARACT EXTRACTION, BILATERAL      Allergies  Allergen Reactions   Doxycycline Nausea And Vomiting   Sulfonamide Derivatives     REACTION: HIVES    Allergies as of 09/12/2020       Reactions   Doxycycline Nausea And Vomiting   Sulfonamide Derivatives    REACTION: HIVES        Medication List        Accurate as of September 12, 2020 11:59 PM. If you have any questions, ask your nurse or doctor.          acetaminophen 500 MG tablet Commonly known as: TYLENOL Take 1,000 mg by mouth 3 (three) times daily as needed.   calcium carbonate 750 MG chewable tablet Commonly known as: TUMS EX Chew 1 tablet by mouth daily. Between the hours of 7am-10am.   carbidopa-levodopa 25-100 MG tablet Commonly known as: SINEMET IR Take 2 tablets by mouth 5 (five) times daily.   carbidopa-levodopa 50-200 MG tablet Commonly known as: SINEMET CR Take 1 tablet by mouth at bedtime. 6.30 am 2 and 10 pm   Cranberry 450 MG Tabs Take 1 tablet by mouth daily.   entacapone 200 MG tablet Commonly known as: COMTAN Take 200 mg by mouth 3 (three) times daily.   ferrous sulfate 325 (65 FE) MG tablet Take 325 mg by mouth every Monday, Wednesday, and Friday. Give with food   hydrocortisone 2.5 % ointment Apply 1 application topically at bedtime as needed.   methocarbamol 500 MG tablet Commonly known as: ROBAXIN Take 250 mg by mouth daily. As needed   midodrine 5 MG tablet Commonly known as: PROAMATINE Take 5 mg by mouth 3 (three) times daily with meals.   mirabegron ER 50 MG Tb24 tablet Commonly known as: MYRBETRIQ Take 50 mg by mouth daily.   pantoprazole 40 MG tablet Commonly known as: PROTONIX Take 40 mg by mouth daily.   polyethylene glycol 17 g packet Commonly known as: MIRALAX / GLYCOLAX Take 17 g by mouth daily.   PreviDent 5000 Booster Plus 1.1 % Pste Generic drug: Sodium  Fluoride Place 1 application onto teeth in the morning and at bedtime.   rivaroxaban 20 MG Tabs tablet Commonly known as: XARELTO Take 20 mg by mouth every evening. Between the hours of 5pm-6pm.   simethicone 80 MG chewable tablet Commonly known as: Gas-X Chew 1 tablet (80 mg total) by mouth  every 6 (six) hours as needed for flatulence.   Sodium Chloride 3 % Aers Place 1 spray into the nose 2 (two) times daily.   Vitamin D3 10 MCG (400 UNIT) Caps Take 1 capsule by mouth daily. Between the hours of 7am-10am.   zinc oxide 20 % ointment Apply 1 application topically as needed for irritation. To buttocks after every incontinent episode and as needed for redness. May keep at bedside.        Review of Systems  Constitutional:  Negative for activity change, appetite change and fever.       Am fatigue.   HENT:  Positive for hearing loss. Negative for congestion, nosebleeds and trouble swallowing.   Eyes:  Positive for visual disturbance.       C/o double vision  Respiratory:  Negative for shortness of breath.   Cardiovascular:  Positive for leg swelling.  Gastrointestinal:  Negative for abdominal pain and constipation.  Genitourinary:  Positive for frequency. Negative for dysuria and urgency.  Musculoskeletal:  Positive for arthralgias, back pain and gait problem. Negative for myalgias.       Right hip pain is well controlled. Lower back discomfort positional.   Skin:  Negative for color change.  Neurological:  Positive for tremors. Negative for speech difficulty and headaches.       Moves slow, fine tremor in fingers, burning sensation  in the R+ L great toes when touched by sheets, comes/goes  Psychiatric/Behavioral:  Negative for behavioral problems and sleep disturbance. The patient is nervous/anxious.        Sleeps from 9pm to 4-5am usually, feels anxious sometimes.    Immunization History  Administered Date(s) Administered   Influenza Split 10/20/2012   Influenza Whole  10/21/2006   Influenza, High Dose Seasonal PF 10/17/2016, 11/02/2019   Influenza-Unspecified 10/20/2013, 10/22/2017   Moderna Sars-Covid-2 Vaccination 01/22/2019, 02/19/2019, 11/29/2019, 06/19/2020   Pneumococcal Conjugate-13 01/31/2013   Pneumococcal Polysaccharide-23 01/20/2005, 11/24/2011   Td 01/21/2004   Tdap 03/16/2014   Zoster, Live 04/17/2009   Pertinent  Health Maintenance Due  Topic Date Due   INFLUENZA VACCINE  08/20/2020   DEXA SCAN  Completed   PNA vac Low Risk Adult  Completed   COLONOSCOPY (Pts 45-18yr Insurance coverage will need to be confirmed)  Discontinued   Fall Risk  05/03/2018 11/03/2017 08/04/2017 03/06/2017 11/26/2016  Falls in the past year? 1 No No Yes Yes  Number falls in past yr: 0 - - 2 or more 2 or more  Injury with Fall? 0 - - No No  Risk Factor Category  - - - High Fall Risk High Fall Risk  Risk for fall due to : Other (Comment) - - - -  Follow up Falls evaluation completed;Education provided;Falls prevention discussed - - Falls evaluation completed Falls evaluation completed   Functional Status Survey:    Vitals:   09/12/20 1504  BP: 121/85  Pulse: 81  Resp: 16  Temp: (!) 94.7 F (34.8 C)  SpO2: 94%  Weight: 130 lb (59 kg)   Body mass index is 19.77 kg/m. Physical Exam Vitals and nursing note reviewed.  Constitutional:      Appearance: Normal appearance.  HENT:     Head: Normocephalic and atraumatic.     Nose: No congestion or rhinorrhea.     Comments: Blood seen in the left nostril, no blood seen in the back of her mouth.     Mouth/Throat:     Mouth: Mucous membranes are moist.  Eyes:  Extraocular Movements: Extraocular movements intact.     Conjunctiva/sclera: Conjunctivae normal.     Pupils: Pupils are equal, round, and reactive to light.     Comments: Chronic double vision, see Ophthalmology 09/20/20  Cardiovascular:     Rate and Rhythm: Normal rate. Rhythm irregular.     Heart sounds: No murmur heard.    Comments: DP  pulses present L>R Pulmonary:     Effort: Pulmonary effort is normal.     Breath sounds: No rales.  Abdominal:     General: Bowel sounds are normal.     Palpations: Abdomen is soft.     Tenderness: There is no abdominal tenderness.     Comments: Pain palpated in the epigastric region.   Musculoskeletal:     Cervical back: Normal range of motion and neck supple.     Right lower leg: Edema present.     Left lower leg: Edema present.     Comments: trace edema BLE. L>R  Skin:    General: Skin is warm and dry.     Findings: Bruising present.     Comments: Anterior left lower shin above ankle skin tear approximated with steri strips, no s/s of infection.   Neurological:     General: No focal deficit present.     Mental Status: She is alert and oriented to person, place, and time. Mental status is at baseline.     Cranial Nerves: No cranial nerve deficit.     Motor: No weakness.     Coordination: Coordination abnormal.     Gait: Gait abnormal.     Comments: Moves slow, tremor in hands. No facial or limb weakness.   Psychiatric:        Mood and Affect: Mood normal.        Behavior: Behavior normal.    Labs reviewed: Recent Labs    03/23/20 0000 03/29/20 0000 05/23/20 0000  NA 141 141 139  K 4.5 4.5 4.3  CL 107 107 105  CO2 28* 23* 27*  BUN 17 17 23*  CREATININE 0.8 0.8 0.7  CALCIUM 9.2 9.4 9.7   Recent Labs    03/23/20 0000 03/29/20 0000 05/23/20 0000  AST 9* 10* 10*  ALT 3* 7 3*  ALKPHOS 58 61 60  ALBUMIN 3.5 3.8 4.2   Recent Labs    03/23/20 0000 03/29/20 0000 05/23/20 0000  WBC 4.5 7.5 7.4  NEUTROABS 2,934.00  --  6,083.00  HGB 11.5* 12.1 12.4  HCT 36 37 38  PLT 164 185 174   Lab Results  Component Value Date   TSH 1.30 03/29/2020   No results found for: HGBA1C Lab Results  Component Value Date   CHOL 185 03/24/2019   HDL 79 (A) 03/24/2019   LDLCALC 94 03/24/2019   LDLDIRECT 104.2 01/25/2013   TRIG 41 03/24/2019   CHOLHDL 2 08/29/2015     Significant Diagnostic Results in last 30 days:  No results found.  Assessment/Plan  Slow transit constipation Stable, continue MiraLax.   Urge incontinence of urine Stable, continue Mirabegron.   GERD (gastroesophageal reflux disease) Stable, continue Protonix.   Anemia Stable, Hgb 12.4 05/23/20, continue iron supplement.   Hypotension Orthostatic hypotension, improved, takes Midodrine.   Parkinsonism ([place]) Parkinson's, symptoms improved, on Sinemet, Comtan, f/u [facility]y at [facility]. double vision, chronic, see Ophthalmology 09/20/20  Osteoarthritis, multiple sites on Tylenol 1037m tid prn, Methocarbamol 251mqd.     Atrial fibrillation (HCC) heart rate is in control, on Xarelto 2098m  qd, off Diltiazem. Hgb 12.4 05/23/20,  TSH 1.30 03/29/20  Bilateral lower extremity edema The patient has chronic edema BLE. Off Metolazone 09/27/19.   Family/ staff Communication: plan of care reviewed with the patient and charge nurse.   Labs/tests ordered:  none  Time spend 35 minutes.

## 2020-09-12 NOTE — Assessment & Plan Note (Signed)
on Tylenol 1000mg  tid prn, Methocarbamol 250mg  qd.

## 2020-09-12 NOTE — Assessment & Plan Note (Signed)
Stable, continue Mirabegron.

## 2020-10-02 ENCOUNTER — Encounter: Payer: Self-pay | Admitting: Internal Medicine

## 2020-10-02 DIAGNOSIS — L821 Other seborrheic keratosis: Secondary | ICD-10-CM | POA: Diagnosis not present

## 2020-10-02 DIAGNOSIS — L814 Other melanin hyperpigmentation: Secondary | ICD-10-CM | POA: Diagnosis not present

## 2020-10-02 DIAGNOSIS — L570 Actinic keratosis: Secondary | ICD-10-CM | POA: Diagnosis not present

## 2020-10-05 ENCOUNTER — Encounter: Payer: Self-pay | Admitting: Orthopedic Surgery

## 2020-10-05 ENCOUNTER — Non-Acute Institutional Stay (SKILLED_NURSING_FACILITY): Payer: Medicare PPO | Admitting: Orthopedic Surgery

## 2020-10-05 DIAGNOSIS — K5901 Slow transit constipation: Secondary | ICD-10-CM

## 2020-10-05 DIAGNOSIS — R600 Localized edema: Secondary | ICD-10-CM

## 2020-10-05 DIAGNOSIS — S81812D Laceration without foreign body, left lower leg, subsequent encounter: Secondary | ICD-10-CM

## 2020-10-05 DIAGNOSIS — G20C Parkinsonism, unspecified: Secondary | ICD-10-CM

## 2020-10-05 DIAGNOSIS — I480 Paroxysmal atrial fibrillation: Secondary | ICD-10-CM

## 2020-10-05 DIAGNOSIS — N3941 Urge incontinence: Secondary | ICD-10-CM

## 2020-10-05 DIAGNOSIS — M542 Cervicalgia: Secondary | ICD-10-CM

## 2020-10-05 DIAGNOSIS — K219 Gastro-esophageal reflux disease without esophagitis: Secondary | ICD-10-CM | POA: Diagnosis not present

## 2020-10-05 DIAGNOSIS — H532 Diplopia: Secondary | ICD-10-CM

## 2020-10-05 DIAGNOSIS — G20 Parkinson's disease: Secondary | ICD-10-CM

## 2020-10-05 DIAGNOSIS — D500 Iron deficiency anemia secondary to blood loss (chronic): Secondary | ICD-10-CM

## 2020-10-05 DIAGNOSIS — G903 Multi-system degeneration of the autonomic nervous system: Secondary | ICD-10-CM

## 2020-10-05 DIAGNOSIS — R1310 Dysphagia, unspecified: Secondary | ICD-10-CM

## 2020-10-05 NOTE — Progress Notes (Signed)
Location:  [place] Room Number: 13 Place of Service:  SNF [phone]) Provider:  Windell Moulding, AGNP-C  [name], MD  Patient Care Team: [name], MD as PCP - General (Internal Medicine) [name], [name], MD as PCP - Cardiology (Cardiology) [name], [name], DO as Consulting Physician (Neurology) Mast, Man X, NP as Nurse Practitioner (Internal Medicine)  Extended Emergency Contact Information Primary Emergency Contact: [place], Hub[place] Mobile Phone: [phone] Relation: Daughter Interpreter needed? No Secondary Emergency Contact: [facility] Address: [address]          [place], [place] [address] [name] of [place] Phone: [phone] Mobile Phone: [phone] Relation: Son  Code Status:  DNR Goals of care: Advanced Directive information Advanced Directives 08/14/2020  Does Patient Have a Medical Advance Directive? Yes  Type of Advance Directive [place]  Does patient want to make changes to medical advance directive? No - Patient declined  Copy of [place] in Chart? Yes - validated most recent copy scanned in chart (See row information)  Would patient like information on creating a medical advance directive? -  Pre-existing out of facility DNR order (yellow form or pink MOST form) -     Chief Complaint  Patient presents with   Medical Management of Chronic Issues    HPI:  Pt is a 80 y.o. female seen today for medical management of chronic diseases.    She continues to reside on the skilled nursing unit at [facility]. Past medical history: afib, BLE edema, hypotension, dysphagia, GERD, constipation, parkinsonism, peripheral neuropathy, OA, OAB, and unstable gait.   Parkinson's- followed by neurology at [facility], reports having good days and able to ambulate with wheelchair and bad days unable to get out of bed, remains on sinemet and Comtan, reports staff is not consistently  administering sinemet correctly and giving her time to let medication dissolve Dysphagia- no recent aspirations, remains on regular diet with bite sized meats and thin liquids Afib- rate controlled without medication, remains on xarelto for clot prevention Chronic BLE edema- continues to use ted hose daily and special pillow for leg elevation Hypotension- SBP> 100, remains on midodrine Anemia- hgb 12.4 05/23/2020, remains on ferrous sulfate OAB- denies increased urgency, remains on Myrbetriq GERD- denies increased reflux, remains on protonix Constipation- LBM 09/16, miralax daily, prune juice QAM Left shin skin tear- slowly healing, nonadherent dressing prn Right neck pain- increased right neck pain, unsure if related to sleeping position, uses neck pillow, tylenol 500 mg tid prn Double vision- intermittent, referred by neurology, seen 09/01- watch and wait, next f/u 11/01  No recent falls, injuries or behavioral outbursts. Ambulates with wheelchair, still able to ambulate with gait belt assistance with [place]. Able to feed self with special utensils.   Recent blood pressures:  09/19- 112/58  09/18- 118/60, 102/70  09/17- 120/82  Recent weights:  09/01- 132.1 lbs  08/01- 133.9 lbs  07/07- 134.9 lbs  Nurse does not report any new concerns, vitals stable.     Past Medical History:  Diagnosis Date   Acute lower UTI 09/14/2018   Anemia 10/15/2018   2016 colonoscopy 10/14/18 wbc 5.4, Hgb 10.1, plt 184 12/01/18 wbc 4.5, Hgb 11.1, plt 188, neutrophils 63,  Na 139, K 3.7, Bun 23, creat 0.69, eGFR 83 on Fe, Hgb 11.7 12/09/18    Atrial fibrillation ([place]) 05/21/2010   10/07/18 Na 143, K 4.0, Bun 20, creat 0.79, eGFR 72, wbc 7.1, Hgb 10.9,  plt 172 10/28/18 Na 142, K 4.0, Bun 19, creat 0.77, eGFR 74 11/18/18 Na 144, K 4.1, Bun 21, creat 0.69, eGFR 83   Bilateral lower extremity edema 07/19/2018   11/02/18 BMP 2 weeks.    Cervical pain (neck) 06/20/2010   Closed fracture of part of upper end of  humerus 05/01/2015   Colles' fracture of right radius 03/05/2015   Constipation 07/19/2018   DEGENERATIVE JOINT DISEASE, RIGHT HIP 02/16/2007   Dupuytren's contracture    Dysuria 09/20/2018   09/20/18 c/o got up several times last night to go urinate, burning on urination, lower abd /back discomfort, but urinary frequency, leakage are not new. UA C/S, Pyridium 158m tid x 2 days.  09/21/18 wbc 6.1, Hgb 11.8, plt 210, neutrophil 69.4, Na 142, K 4.1, Bun 19, creat 0.78, TP 6.4, albumin 3.9   Hematuria 02/04/2014   Hypotension 09/18/2018   Long term (current) use of anticoagulants 05/29/2016   Osteoarthritis of hip    right   Osteoarthritis of left knee    Overactive bladder 10/06/2018   [place]inson disease ([place]    Paroxysmal [place]-fib ([place]    POSTMENOPAUSAL SYNDROME 02/16/2007   PREMATURE ATRIAL CONTRACTIONS 02/16/2007   Primary osteoarthritis of right shoulder 04/27/2017   S/P breast biopsy, left    two o'clock position - benign   Toxic effect of venom(989.5) 07/27/2007   Tremor, unspecified 10/19/2015   Unstable gait 02/16/2017   Vaginal atrophy 10/19/2015   Venous insufficiency    Weakness 09/12/2018   Past Surgical History:  Procedure Laterality Date   BREAST BIOPSY Left    CATARACT EXTRACTION, BILATERAL      Allergies  Allergen Reactions   Doxycycline Nausea And Vomiting   Sulfonamide Derivatives     REACTION: HIVES    Outpatient Encounter Medications as of 10/05/2020  Medication Sig   acetaminophen (TYLENOL) 500 MG tablet Take 1,000 mg by mouth 3 (three) times daily as needed.    calcium carbonate (TUMS EX) 750 MG chewable tablet Chew 1 tablet by mouth daily. Between the hours of 7am-10am.   carbidopa-levodopa (SINEMET CR) 50-200 MG tablet Take 1 tablet by mouth at bedtime. 6.30 am 2 and 10 pm   carbidopa-levodopa (SINEMET IR) 25-100 MG tablet Take 2 tablets by mouth 5 (five) times daily.   Cholecalciferol (VITAMIN D3) 10 MCG (400 UNIT) CAPS Take 1 capsule by mouth daily. Between the hours of  7am-10am.   Cranberry 450 MG TABS Take 1 tablet by mouth daily.   entacapone (COMTAN) 200 MG tablet Take 200 mg by mouth 3 (three) times daily.   ferrous sulfate 325 (65 FE) MG tablet Take 325 mg by mouth every Monday, Wednesday, and Friday. Give with food   hydrocortisone 2.5 % ointment Apply 1 application topically at bedtime as needed.    methocarbamol (ROBAXIN) 500 MG tablet Take 250 mg by mouth daily. As needed   midodrine (PROAMATINE) 5 MG tablet Take 5 mg by mouth 3 (three) times daily with meals.   mirabegron ER (MYRBETRIQ) 50 MG TB24 tablet Take 50 mg by mouth daily.   pantoprazole (PROTONIX) 40 MG tablet Take 40 mg by mouth daily.   polyethylene glycol (MIRALAX / GLYCOLAX) 17 g packet Take 17 g by mouth daily.   rivaroxaban (XARELTO) 20 MG TABS tablet Take 20 mg by mouth every evening. Between the hours of 5pm-6pm.   simethicone (GAS-X) 80 MG chewable tablet Chew 1 tablet (80 mg total) by mouth every 6 (six) hours as needed for flatulence.  Sodium Chloride 3 % AERS Place 1 spray into the nose 2 (two) times daily.   Sodium Fluoride (PREVIDENT 5000 BOOSTER PLUS) 1.1 % PSTE Place 1 application onto teeth in the morning and at bedtime.   zinc oxide 20 % ointment Apply 1 application topically as needed for irritation. To buttocks after every incontinent episode and as needed for redness. May keep at bedside.   No facility-administered encounter medications on file as of 10/05/2020.    Review of Systems  Constitutional:  Negative for activity change, appetite change, fatigue and fever.  HENT:  Positive for trouble swallowing. Negative for congestion and hearing loss.   Eyes:  Positive for visual disturbance.       Glasses, double vision  Respiratory:  Negative for cough, shortness of breath and wheezing.   Cardiovascular:  Positive for leg swelling. Negative for chest pain.  Gastrointestinal:  Positive for constipation. Negative for abdominal distention, abdominal pain, blood in stool,  diarrhea and nausea.  Genitourinary:  Positive for frequency. Negative for dysuria, hematuria and urgency.  Musculoskeletal:  Positive for arthralgias, gait problem and neck pain.  Skin:        Skin tear  Neurological:  Positive for tremors and weakness. Negative for dizziness and headaches.  Psychiatric/Behavioral:  Negative for confusion, dysphoric mood and sleep disturbance. The patient is not nervous/anxious.    Immunization History  Administered Date(s) Administered   Influenza Split 10/20/2012   Influenza Whole 10/21/2006   Influenza, High Dose Seasonal PF 10/17/2016, 11/02/2019   Influenza-Unspecified 10/20/2013, 10/22/2017   Moderna Sars-Covid-2 Vaccination 01/22/2019, 02/19/2019, 11/29/2019, 06/19/2020   Pneumococcal Conjugate-13 01/31/2013   Pneumococcal Polysaccharide-23 01/20/2005, 11/24/2011   Td 01/21/2004   Tdap 03/16/2014   Zoster, Live 04/17/2009   Pertinent  Health Maintenance Due  Topic Date Due   INFLUENZA VACCINE  08/20/2020   DEXA SCAN  Completed   COLONOSCOPY (Pts 45-90yr Insurance coverage will need to be confirmed)  Discontinued   Fall Risk  05/03/2018 11/03/2017 08/04/2017 03/06/2017 11/26/2016  Falls in the past year? 1 No No Yes Yes  Number falls in past yr: 0 - - 2 or more 2 or more  Injury with Fall? 0 - - No No  Risk Factor Category  - - - High Fall Risk High Fall Risk  Risk for fall due to : Other (Comment) - - - -  Follow up Falls evaluation completed;Education provided;Falls prevention discussed - - Falls evaluation completed Falls evaluation completed   Functional Status Survey:    Vitals:   10/05/20 1535  BP: 112/76  Pulse: 75  Resp: 18  Temp: (!) 97.5 F (36.4 C)  SpO2: 96%  Weight: 132 lb 11.2 oz (60.2 kg)  Height: _0  (1.727 m)   Body mass index is 20.18 kg/m. Physical Exam Vitals reviewed.  Constitutional:      General: She is not in acute distress. HENT:     Head: Normocephalic.     Right Ear: There is no impacted  cerumen.     Left Ear: There is no impacted cerumen.     Nose: Nose normal.     Mouth/Throat:     Mouth: Mucous membranes are moist.  Eyes:     General:        Right eye: No discharge.        Left eye: No discharge.  Neck:     Vascular: No carotid bruit.     Comments: Tenderness palpated over right sternocleidomastoid Cardiovascular:  Rate and Rhythm: Normal rate. Rhythm irregular.     Pulses: Normal pulses.     Heart sounds: Normal heart sounds. No murmur heard. Pulmonary:     Effort: Pulmonary effort is normal. No respiratory distress.     Breath sounds: Normal breath sounds. No wheezing.  Abdominal:     General: Bowel sounds are normal. There is no distension.     Palpations: Abdomen is soft.     Tenderness: There is no abdominal tenderness.  Musculoskeletal:     Cervical back: Normal range of motion.     Right lower leg: Edema present.     Left lower leg: Edema present.     Comments: 1+ pitting  Lymphadenopathy:     Cervical: No cervical adenopathy.  Skin:    General: Skin is warm and dry.     Capillary Refill: Capillary refill takes less than 2 seconds.     Comments: Quarter sized skin tear to left ankle, CDI, no drainage, surrounding skin intact.   Neurological:     General: No focal deficit present.     Mental Status: She is alert and oriented to person, place, and time.     Motor: Weakness present.     Gait: Gait abnormal.     Comments: fine tremor to fingers, bilateral feet sensitive to touch, reports burning sensation  Psychiatric:        Mood and Affect: Mood normal.        Behavior: Behavior normal.     Comments: Anxious at times    Labs reviewed: Recent Labs    03/23/20 0000 03/29/20 0000 05/23/20 0000  NA 141 141 139  K 4.5 4.5 4.3  CL 107 107 105  CO2 28* 23* 27*  BUN 17 17 23*  CREATININE 0.8 0.8 0.7  CALCIUM 9.2 9.4 9.7   Recent Labs    03/23/20 0000 03/29/20 0000 05/23/20 0000  AST 9* 10* 10*  ALT 3* 7 3*  ALKPHOS 58 61 60   ALBUMIN 3.5 3.8 4.2   Recent Labs    03/23/20 0000 03/29/20 0000 05/23/20 0000  WBC 4.5 7.5 7.4  NEUTROABS 2,934.00  --  6,083.00  HGB 11.5* 12.1 12.4  HCT 36 37 38  PLT 164 185 174   Lab Results  Component Value Date   TSH 1.30 03/29/2020   No results found for: HGBA1C Lab Results  Component Value Date   CHOL 185 03/24/2019   HDL 79 (A) 03/24/2019   LDLCALC 94 03/24/2019   LDLDIRECT 104.2 01/25/2013   TRIG 41 03/24/2019   CHOLHDL 2 08/29/2015    Significant Diagnostic Results in last 30 days:  No results found.  Assessment/Plan 1. Primary parkinsonism [facility]) - followed by neurology at [facility] - has good days and bad with mobility - reports sinemet not given consistently by nursing staff- discussed with assistant DON - cont sinemet and Comtan - cont skilled nursing care  2. Dysphagia, unspecified type - no recent aspirations - cont regular diet with bit sized foods  3. Paroxysmal atrial fibrillation (HCC) - rate controlled without medication - cont xarelto for clot prevention  4. Bilateral lower extremity edema - 1+ edema on exam - cont ted hose daily and leg elevation  5. Neurogenic orthostatic hypotension (HCC) - SBP> 100 - cont midodrine - cmp  6. Iron deficiency anemia due to chronic blood loss - hgb 12.4 05/2020 - cont ferrous sulfate - cbc/diff  7. Urge incontinence of urine - stable with Myrbetriq  8. Gastroesophageal reflux disease, unspecified whether esophagitis present - denies increased reflux - cont protonix  9. Slow transit constipation - LBM 09/16 - cont miralax and prune juice daily  10. Noninfected skin tear of left lower extremity, subsequent encounter - slow healing, no sign of infection - cont nonadherent dressings prn  11. Neck pain - some tenderness palpated over right sternocleidomastoid - voltaren gel 1%- apply to right neck tid x 2 weeks, then tid prn - cont tylenol 500 mg prn  12. Double vision with both  eyes open - intermittent - seen by opthalmology- no new orders - next f/u 11/01    Family/ staff Communication: plan discussed with patient and nurse  Labs/tests ordered:  cbc/diff, cmp

## 2020-10-11 DIAGNOSIS — D649 Anemia, unspecified: Secondary | ICD-10-CM | POA: Diagnosis not present

## 2020-10-11 DIAGNOSIS — I10 Essential (primary) hypertension: Secondary | ICD-10-CM | POA: Diagnosis not present

## 2020-10-12 LAB — CBC: RBC: 3.59 — AB (ref 3.87–5.11)

## 2020-10-12 LAB — BASIC METABOLIC PANEL
BUN: 20 (ref 4–21)
CO2: 27 — AB (ref 13–22)
Chloride: 109 — AB (ref 99–108)
Creatinine: 0.7 (ref 0.5–1.1)
Glucose: 79
Potassium: 4.2 (ref 3.4–5.3)
Sodium: 141 (ref 137–147)

## 2020-10-12 LAB — HEPATIC FUNCTION PANEL
ALT: 5 — AB (ref 7–35)
AST: 11 — AB (ref 13–35)
Alkaline Phosphatase: 53 (ref 25–125)
Bilirubin, Total: 0.5

## 2020-10-12 LAB — CBC AND DIFFERENTIAL
HCT: 35 — AB (ref 36–46)
Hemoglobin: 11 — AB (ref 12.0–16.0)
Neutrophils Absolute: 2817
Platelets: 160 (ref 150–399)
WBC: 4.1

## 2020-10-12 LAB — COMPREHENSIVE METABOLIC PANEL
Albumin: 3.6 (ref 3.5–5.0)
Calcium: 9 (ref 8.7–10.7)
Globulin: 2.3

## 2020-10-22 ENCOUNTER — Non-Acute Institutional Stay (SKILLED_NURSING_FACILITY): Payer: Medicare PPO | Admitting: Nurse Practitioner

## 2020-10-22 ENCOUNTER — Encounter: Payer: Self-pay | Admitting: Nurse Practitioner

## 2020-10-22 DIAGNOSIS — I480 Paroxysmal atrial fibrillation: Secondary | ICD-10-CM | POA: Diagnosis not present

## 2020-10-22 DIAGNOSIS — T148XXA Other injury of unspecified body region, initial encounter: Secondary | ICD-10-CM

## 2020-10-22 DIAGNOSIS — N3281 Overactive bladder: Secondary | ICD-10-CM

## 2020-10-22 DIAGNOSIS — G20 Parkinson's disease: Secondary | ICD-10-CM

## 2020-10-22 DIAGNOSIS — K219 Gastro-esophageal reflux disease without esophagitis: Secondary | ICD-10-CM

## 2020-10-22 DIAGNOSIS — I872 Venous insufficiency (chronic) (peripheral): Secondary | ICD-10-CM

## 2020-10-22 DIAGNOSIS — K5901 Slow transit constipation: Secondary | ICD-10-CM

## 2020-10-22 DIAGNOSIS — G903 Multi-system degeneration of the autonomic nervous system: Secondary | ICD-10-CM | POA: Diagnosis not present

## 2020-10-22 DIAGNOSIS — D500 Iron deficiency anemia secondary to blood loss (chronic): Secondary | ICD-10-CM | POA: Diagnosis not present

## 2020-10-22 DIAGNOSIS — L089 Local infection of the skin and subcutaneous tissue, unspecified: Secondary | ICD-10-CM

## 2020-10-22 DIAGNOSIS — M159 Polyosteoarthritis, unspecified: Secondary | ICD-10-CM | POA: Diagnosis not present

## 2020-10-22 NOTE — Progress Notes (Signed)
Location:   [facility] [place] Room Number: 13 A Place of Service:  SNF (31) Provider: [facility] Ruben Mahler NP  [name], MD  Patient Care Team: [name], MD as PCP - General (Internal Medicine) [name], [name], MD as PCP - Cardiology (Cardiology) [name], [name], DO as Consulting Physician (Neurology) Akiya Morr X, NP as Nurse Practitioner (Internal Medicine)  Extended Emergency Contact Information Primary Emergency Contact: Sofiya, Ezelle Mobile Phone: [phone] Relation: Daughter Interpreter needed? No Secondary Emergency Contact: [facility] Address: [address]          [place], [place] [address] [name] of [place] Phone: [phone] Mobile Phone: [phone] Relation: Son  Code Status: DNR Goals of care: Advanced Directive information Advanced Directives 08/14/2020  Does Patient Have a Medical Advance Directive? Yes  Type of Advance Directive [place]  Does patient want to make changes to medical advance directive? No - Patient declined  Copy of [place] in Chart? Yes - validated most recent copy scanned in chart (See row information)  Would patient like information on creating a medical advance directive? -  Pre-existing out of facility DNR order (yellow form or pink MOST form) -     Chief Complaint  Patient presents with   Acute Visit    Infected wound left lower leg     HPI:  Pt is a 80 y.o. female seen today for an acute visit for non healing anterior left lower leg above the ankle wound with scant serous appearance drainage, noted mild redness, swelling, and discomfort per open wound. No calf tenderness noted.   The patient has chronic edema BLE. Off Metolazone 09/27/19.             Afib, heart rate is in control, on Xarelto 40m qd, off Diltiazem. Hgb 12.4 05/23/20,  TSH 1.30 03/29/20             OA,  on Tylenol 10014mtid prn, Methocarbamol 25057md.                Parkinson's, symptoms improved,  on Sinemet, Comtan[facility]              Orthostatic hypotension, improved, takes Midodrine.              Anemia, stable, Hgb 12.4 05/23/20,  takes Fe             GERD, improved since change to Protonix.              Urge incontinent of urine, takes Mirabegron.              Constipation, takes MiraLax  Past Medical History:  Diagnosis Date   Acute lower UTI 09/14/2018   Anemia 10/15/2018   2016 colonoscopy 10/14/18 wbc 5.4, Hgb 10.1, plt 184 12/01/18 wbc 4.5, Hgb 11.1, plt 188, neutrophils 63,  Na 139, K 3.7, Bun 23, creat 0.69, eGFR 83 on Fe, Hgb 11.7 12/09/18    Atrial fibrillation ([place]/01/2010   10/07/18 Na 143, K 4.0, Bun 20, creat 0.79, eGFR 72, wbc 7.1, Hgb 10.9, plt 172 10/28/18 Na 142, K 4.0, Bun 19, creat 0.77, eGFR 74 11/18/18 Na 144, K 4.1, Bun 21, creat 0.69, eGFR 83   Bilateral lower extremity edema 07/19/2018   11/02/18 BMP 2 weeks.    Cervical pain (neck) 06/20/2010   Closed fracture of part of upper end of humerus 05/01/2015  Colles' fracture of right radius 03/05/2015   Constipation 07/19/2018   DEGENERATIVE JOINT DISEASE, RIGHT HIP 02/16/2007   Dupuytren's contracture    Dysuria 09/20/2018   09/20/18 c/o got up several times last night to go urinate, burning on urination, lower abd /back discomfort, but urinary frequency, leakage are not new. UA C/S, Pyridium 160m tid x 2 days.  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   S/P breast biopsy, left    two o'clock position -  benign   Toxic effect of venom(989.5) 07/27/2007   Tremor, unspecified 10/19/2015   Unstable gait 02/16/2017   Vaginal atrophy 10/19/2015   Venous insufficiency    Weakness 09/12/2018   Past Surgical History:  Procedure Laterality Date   BREAST BIOPSY Left    CATARACT EXTRACTION, BILATERAL      Allergies  Allergen Reactions   Doxycycline Nausea And Vomiting   Sulfonamide Derivatives     REACTION: HIVES    Allergies as of 10/22/2020       Reactions   Doxycycline Nausea And Vomiting   Sulfonamide Derivatives    REACTION: HIVES        Medication List        Accurate as of October 22, 2020 11:59 PM. If you have any questions, ask your nurse or doctor.          STOP taking these medications    hydrocortisone 2.5 % ointment Stopped by: Cherise Fedder X Gioia Ranes, NP       TAKE these medications    acetaminophen 500 MG tablet Commonly known as: TYLENOL Take 1,000 mg by mouth 3 (three) times daily as needed.   calcium carbonate 750 MG chewable tablet Commonly known as: TUMS EX Chew 1 tablet by mouth daily. Between the hours of 7am-10am.   carbidopa-levodopa 25-100 MG tablet Commonly known as: SINEMET IR Take 2 tablets by mouth 5 (five) times daily.   carbidopa-levodopa 50-200 MG tablet Commonly known as: SINEMET CR Take 1 tablet by mouth at bedtime. 6.30 am 2 and 10 pm   Cranberry 450 MG Tabs Take 1 tablet by mouth daily.   entacapone 200 MG tablet Commonly known as: COMTAN Take 200 mg by mouth 3 (three) times daily.   ferrous sulfate 325 (65 FE) MG tablet Take 325 mg by mouth every Monday, Wednesday, and Friday. Give with food   methocarbamol 500 MG tablet Commonly known as: ROBAXIN Take 250 mg by mouth daily. As needed   midodrine 5 MG tablet Commonly known as: PROAMATINE Take 5 mg by mouth 3 (three) times daily with meals.   mirabegron ER 50 MG Tb24 tablet Commonly known as: MYRBETRIQ Take 50 mg by mouth daily.   pantoprazole 40 MG tablet Commonly known  as: PROTONIX Take 40 mg by mouth daily.   polyethylene glycol 17 g packet Commonly known as: MIRALAX / GLYCOLAX Take 17 g by mouth daily.   PreviDent 5000 Booster Plus 1.1 % Pste Generic drug: Sodium Fluoride Place 1 application onto teeth in the morning and  at bedtime.   rivaroxaban 20 MG Tabs tablet Commonly known as: XARELTO Take 20 mg by mouth every evening. Between the hours of 5pm-6pm.   simethicone 80 MG chewable tablet Commonly known as: Gas-X Chew 1 tablet (80 mg total) by mouth every 6 (six) hours as needed for flatulence.   Sodium Chloride 3 % Aers Place 1 spray into the nose 2 (two) times daily.   Vitamin D3 10 MCG (400 UNIT) Caps Take 1 capsule by mouth daily. Between the hours of 7am-10am.   Voltaren 1 % Gel Generic drug: diclofenac Sodium Apply topically 3 (three) times daily as needed.   zinc oxide 20 % ointment Apply 1 application topically as needed for irritation. To buttocks after every incontinent episode and as needed for redness. May keep at bedside.        Review of Systems  Constitutional:  Negative for activity change, appetite change and fever.       Am fatigue.   HENT:  Positive for hearing loss. Negative for congestion, nosebleeds and trouble swallowing.   Eyes:  Positive for visual disturbance.       C/o double vision  Respiratory:  Negative for shortness of breath.   Cardiovascular:  Positive for leg swelling.  Gastrointestinal:  Negative for abdominal pain and constipation.  Genitourinary:  Positive for frequency. Negative for dysuria and urgency.  Musculoskeletal:  Positive for arthralgias, back pain and gait problem. Negative for myalgias.       Right hip pain is well controlled. Lower back discomfort positional.   Skin:  Positive for wound.  Neurological:  Positive for tremors. Negative for speech difficulty and headaches.       Moves slow, fine tremor in fingers, burning sensation  in the R+ L great toes when touched by sheets,  comes/goes  Psychiatric/Behavioral:  Negative for behavioral problems and sleep disturbance. The patient is nervous/anxious.        Sleeps from 9pm to 4-5am usually, feels anxious sometimes.    Immunization History  Administered Date(s) Administered   Influenza Split 10/20/2012   Influenza Whole 10/21/2006   Influenza, High Dose Seasonal PF 10/17/2016, 11/02/2019   Influenza-Unspecified 10/20/2013, 10/22/2017   Moderna Sars-Covid-2 Vaccination 01/22/2019, 02/19/2019, 11/29/2019, 06/19/2020   Pneumococcal Conjugate-13 01/31/2013   Pneumococcal Polysaccharide-23 01/20/2005, 11/24/2011   Td 01/21/2004   Tdap 03/16/2014   Zoster, Live 04/17/2009   Pertinent  Health Maintenance Due  Topic Date Due   INFLUENZA VACCINE  08/20/2020   DEXA SCAN  Completed   COLONOSCOPY (Pts 45-28yr Insurance coverage will need to be confirmed)  Discontinued   Fall Risk  05/03/2018 11/03/2017 08/04/2017 03/06/2017 11/26/2016  Falls in the past year? 1 No No Yes Yes  Number falls in past yr: 0 - - 2 or more 2 or more  Injury with Fall? 0 - - No No  Risk Factor Category  - - - High Fall Risk High Fall Risk  Risk for fall due to : Other (Comment) - - - -  Follow up Falls evaluation completed;Education provided;Falls prevention discussed - - Falls evaluation completed Falls evaluation completed   Functional Status Survey:    Vitals:   10/22/20 1636  BP: 116/64  Resp: 18  Temp: (!) 97.1 F (36.2 C)  SpO2: 92%  Weight: 134 lb (60.8 kg)  Height: '5\' 8"'  (1.727 m)   Body mass index is 20.37 kg/m. Physical Exam Vitals and nursing note reviewed.  Constitutional:      Appearance: Normal appearance.  HENT:  Head: Normocephalic and atraumatic.     Nose: No congestion or rhinorrhea.     Comments: Blood seen in the left nostril, no blood seen in the back of her mouth.     Mouth/Throat:     Mouth: Mucous membranes are moist.  Eyes:     Extraocular Movements: Extraocular movements intact.      Conjunctiva/sclera: Conjunctivae normal.     Pupils: Pupils are equal, round, and reactive to light.     Comments: Chronic double vision, see Ophthalmology 09/20/20  Cardiovascular:     Rate and Rhythm: Normal rate. Rhythm irregular.     Heart sounds: No murmur heard.    Comments: DP pulses present L>R Pulmonary:     Effort: Pulmonary effort is normal.     Breath sounds: No rales.  Abdominal:     General: Bowel sounds are normal.     Palpations: Abdomen is soft.     Tenderness: There is no abdominal tenderness.     Comments: Pain palpated in the epigastric region.   Musculoskeletal:     Cervical back: Normal range of motion and neck supple.     Right lower leg: Edema present.     Left lower leg: Edema present.     Comments: trace edema BLE. L>R  Skin:    General: Skin is warm and dry.     Comments: Anterior left lower shin above ankle skin tear, developed into infected wound about quarter sized, scan serous drainage, mild erythema, warmth, more swelling, and discomfort peri wound region.   Neurological:     General: No focal deficit present.     Mental Status: She is alert and oriented to person, place, and time. Mental status is at baseline.     Cranial Nerves: No cranial nerve deficit.     Motor: No weakness.     Coordination: Coordination abnormal.     Gait: Gait abnormal.     Comments: Moves slow, tremor in hands. No facial or limb weakness.   Psychiatric:        Mood and Affect: Mood normal.        Behavior: Behavior normal.    Labs reviewed: Recent Labs    03/23/20 0000 03/29/20 0000 05/23/20 0000  NA 141 141 139  K 4.5 4.5 4.3  CL 107 107 105  CO2 28* 23* 27*  BUN 17 17 23*  CREATININE 0.8 0.8 0.7  CALCIUM 9.2 9.4 9.7   Recent Labs    03/23/20 0000 03/29/20 0000 05/23/20 0000  AST 9* 10* 10*  ALT 3* 7 3*  ALKPHOS 58 61 60  ALBUMIN 3.5 3.8 4.2   Recent Labs    03/23/20 0000 03/29/20 0000 05/23/20 0000  WBC 4.5 7.5 7.4  NEUTROABS 2,934.00  --   6,083.00  HGB 11.5* 12.1 12.4  HCT 36 37 38  PLT 164 185 174   Lab Results  Component Value Date   TSH 1.30 03/29/2020   No results found for: HGBA1C Lab Results  Component Value Date   CHOL 185 03/24/2019   HDL 79 (A) 03/24/2019   LDLCALC 94 03/24/2019   LDLDIRECT 104.2 01/25/2013   TRIG 41 03/24/2019   CHOLHDL 2 08/29/2015    Significant Diagnostic Results in last 30 days:  No results found.  Assessment/Plan: Infected wound non healing anterior left lower leg above the ankle wound with scant serous appearance drainage, noted mild redness, swelling, and discomfort per open wound. No calf tenderness noted. Apply Bactroban oint  bid x 10 days, treat with Doxycycline 137m bid x 5 days. Observe.   Edema of left lower extremity due to peripheral venous insufficiency chronic edema BLE. Off Metolazone 09/27/19. More in LLE due to the infected wound.   Atrial fibrillation (HCC) heart rate is in control, on Xarelto 258mqd, off Diltiazem. 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     Parkinsonism (HCC)Mauirkinson's, symptoms improved, on Sinemet, Comtan[facility], [facility]   Hypotension , improved, takes Midodrine.   Anemia stable, Hgb 12.4 05/23/20,  takes Fe  GERD (gastroesophageal reflux disease) improved since change to Protonix.   Overactive bladder takes Mirabegron.   Slow transit constipation  takes MiraFreight forwardermunication: plan of care reviewed with the patient and charge nurse.   Labs/tests ordered:  none  Time spend 35 minutes.

## 2020-10-22 NOTE — Assessment & Plan Note (Signed)
on Tylenol 1000mg  tid prn, Methocarbamol 250mg  qd.

## 2020-10-22 NOTE — Assessment & Plan Note (Signed)
takes MiraLax  

## 2020-10-22 NOTE — Assessment & Plan Note (Signed)
,   improved, takes Midodrine.

## 2020-10-22 NOTE — Assessment & Plan Note (Signed)
Parkinson's, symptoms improved, on Sinemet, Comtan, f/u neurology at [facility]. double vision, chronic, saw Ophthalmology

## 2020-10-22 NOTE — Assessment & Plan Note (Signed)
stable, Hgb 12.4 05/23/20,  takes Fe

## 2020-10-22 NOTE — Assessment & Plan Note (Signed)
non healing anterior left lower leg above the ankle wound with scant serous appearance drainage, noted mild redness, swelling, and discomfort per open wound. No calf tenderness noted. Apply Bactroban oint bid x 10 days, treat with Doxycycline 100mg  bid x 5 days. Observe.

## 2020-10-22 NOTE — Assessment & Plan Note (Signed)
heart rate is in control, on Xarelto 20mg  qd, off Diltiazem. Hgb 12.4 05/23/20,  TSH 1.30 03/29/20

## 2020-10-22 NOTE — Assessment & Plan Note (Signed)
chronic edema BLE. Off Metolazone 09/27/19. More in LLE due to the infected wound.

## 2020-10-22 NOTE — Assessment & Plan Note (Signed)
takes Mirabegron.

## 2020-10-22 NOTE — Assessment & Plan Note (Signed)
improved since change to Protonix.

## 2020-10-23 ENCOUNTER — Encounter: Payer: Self-pay | Admitting: Nurse Practitioner

## 2020-10-29 ENCOUNTER — Encounter: Payer: Self-pay | Admitting: Nurse Practitioner

## 2020-10-29 ENCOUNTER — Non-Acute Institutional Stay (SKILLED_NURSING_FACILITY): Payer: Medicare PPO | Admitting: Nurse Practitioner

## 2020-10-29 DIAGNOSIS — N3941 Urge incontinence: Secondary | ICD-10-CM

## 2020-10-29 DIAGNOSIS — G903 Multi-system degeneration of the autonomic nervous system: Secondary | ICD-10-CM

## 2020-10-29 DIAGNOSIS — K219 Gastro-esophageal reflux disease without esophagitis: Secondary | ICD-10-CM | POA: Diagnosis not present

## 2020-10-29 DIAGNOSIS — D500 Iron deficiency anemia secondary to blood loss (chronic): Secondary | ICD-10-CM

## 2020-10-29 DIAGNOSIS — M159 Polyosteoarthritis, unspecified: Secondary | ICD-10-CM

## 2020-10-29 DIAGNOSIS — K5901 Slow transit constipation: Secondary | ICD-10-CM

## 2020-10-29 DIAGNOSIS — L089 Local infection of the skin and subcutaneous tissue, unspecified: Secondary | ICD-10-CM

## 2020-10-29 DIAGNOSIS — I480 Paroxysmal atrial fibrillation: Secondary | ICD-10-CM | POA: Diagnosis not present

## 2020-10-29 DIAGNOSIS — T148XXA Other injury of unspecified body region, initial encounter: Secondary | ICD-10-CM | POA: Diagnosis not present

## 2020-10-29 DIAGNOSIS — G20 Parkinson's disease: Secondary | ICD-10-CM

## 2020-10-29 DIAGNOSIS — I872 Venous insufficiency (chronic) (peripheral): Secondary | ICD-10-CM

## 2020-10-29 NOTE — Assessment & Plan Note (Signed)
improved, on Sinemet, Comtan, f/u neurology at [facility]. double vision, chronic, saw Ophthalmology

## 2020-10-29 NOTE — Assessment & Plan Note (Signed)
Slow healing anterior left lower leg above the ankle wound, small open areas, no purulent drainage, near resolution of redness, warmth, but swelling in hte left lower leg and ankle still more than the right. Will try Furosemide 20mg , Kcl 95meq qd x 3 days to decrease swelling/faciliate wound healing. Continue Bactroban oint x 10 days.

## 2020-10-29 NOTE — Assessment & Plan Note (Signed)
takes Mirabegron.

## 2020-10-29 NOTE — Progress Notes (Signed)
Location:   SNF Page Room Number: 13 A Place of Service:  SNF (31) Provider: [facility] Raffi Milstein NP  [name], MD  Patient Care Team: [name], MD as PCP - General (Internal Medicine) [name], [name], MD as PCP - Cardiology (Cardiology) [name], [name], DO as Consulting Physician (Neurology) Ashtyn Meland X, NP as Nurse Practitioner (Internal Medicine)  Extended Emergency Contact Information Primary Emergency Contact: Toneshia, Coello Mobile Phone: [phone] Relation: Daughter Interpreter needed? No Secondary Emergency Contact: [facility] Address: [address]          [place], [place] [address] [name] of [place] Phone: [phone] Mobile Phone: [phone] Relation: Son  Code Status: DNR Goals of care: Advanced Directive information Advanced Directives 08/14/2020  Does Patient Have a Medical Advance Directive? Yes  Type of Advance Directive [place]  Does patient want to make changes to medical advance directive? No - Patient declined  Copy of [place] in Chart? Yes - validated most recent copy scanned in chart (See row information)  Would patient like information on creating a medical advance directive? -  Pre-existing out of facility DNR order (yellow form or pink MOST form) -     Chief Complaint  Patient presents with   Acute Visit    Mid sternum hurting when swallows    HPI:  Pt is a 80 y.o. female seen today for an acute visit for reported episodes of mid sternum hurting when she swallows, difficulty swallowing x2 days, belches and gas pill relieves a little, no occurrence with today. Denied nausea, vomiting, or abd pain.   GERD, takes Protonix.    Slow healing anterior left lower leg above the ankle wound, small open areas, no purulent drainage, near resolution of redness, warmth, but swelling in hte left lower leg and ankle still more than the right.              The patient has chronic edema  BLE. Off Metolazone 09/27/19.             Afib, heart rate is in control, on Xarelto 64m qd, off Diltiazem. Hgb 12.4 05/23/20,  TSH 1.30 03/29/20             OA,  on Tylenol 10059mtid prn, Methocarbamol 25069md.                Parkinson's, symptoms improved, on Sinemet, Comtan[facility]              Orthostatic hypotension, improved, takes Midodrine.              Anemia, stable, Hgb 12.4 05/23/20,  takes Fe             Urge incontinent of urine, takes Mirabegron.              Constipation, takes MiraLax     Past Medical History:  Diagnosis Date   Acute lower UTI 09/14/2018   Anemia 10/15/2018   2016 colonoscopy 10/14/18 wbc 5.4, Hgb 10.1, plt 184 12/01/18 wbc 4.5, Hgb 11.1, plt 188, neutrophils 63,  Na 139, K 3.7, Bun 23, creat 0.69, eGFR 83 on Fe, Hgb 11.7 12/09/18    [place]   10/07/18 Na 143, K 4.0, Bun 20, creat 0.79, eGFR 72, wbc 7.1, Hgb 10.9, plt 172 10/28/18 Na 142, K 4.0, Bun 19, creat 0.77, eGFR 74 11/18/18 Na 144, K  4.1, Bun 21, creat 0.69, eGFR 83   Bilateral lower extremity edema 07/19/2018   11/02/18 BMP 2 weeks.    Cervical pain (neck) 06/20/2010   Closed fracture of part of upper end of humerus 05/01/2015   Colles' fracture of right radius 03/05/2015   Constipation 07/19/2018   DEGENERATIVE JOINT DISEASE, RIGHT HIP 02/16/2007   Dupuytren's contracture    Dysuria 09/20/2018   09/20/18 c/o got up several times last night to go urinate, burning on urination, lower abd /back discomfort, but urinary frequency, leakage are not new. UA C/S, Pyridium 163m tid x 2 days.  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/2017   S/P breast biopsy, left    two o'clock position - benign   Toxic effect of venom(989.5) 07/27/2007   Tremor, unspecified 10/19/2015   Unstable gait 02/16/2017   Vaginal atrophy 10/19/2015   Venous insufficiency    Weakness 09/12/2018   Past Surgical History:  Procedure Laterality Date   BREAST BIOPSY Left    CATARACT EXTRACTION, BILATERAL      Allergies  Allergen Reactions   Doxycycline Nausea And Vomiting   Sulfonamide Derivatives     REACTION: HIVES    Allergies as of 10/29/2020       Reactions   Doxycycline Nausea And Vomiting   Sulfonamide Derivatives    REACTION: HIVES        Medication List        Accurate as of October 29, 2020 11:59 PM. If you have any questions, ask your nurse or doctor.          acetaminophen 500 MG tablet Commonly known as: TYLENOL Take 1,000 mg by mouth 3 (three) times daily as needed.   calcium carbonate 750 MG chewable tablet Commonly known as: TUMS EX Chew 1 tablet by mouth daily. Between the hours of 7am-10am.   carbidopa-levodopa 25-100 MG tablet Commonly known as: SINEMET IR Take 2 tablets by mouth 5 (five) times daily.   carbidopa-levodopa 50-200 MG tablet Commonly known as: SINEMET CR Take 1 tablet by mouth at bedtime. 6.30 am 2 and 10 pm   Cranberry 450 MG Tabs Take 1 tablet by mouth daily.   diclofenac Sodium 1 % Gel Commonly known as: VOLTAREN Apply topically 3 (three) times daily as needed.   entacapone 200 MG tablet Commonly known as: COMTAN Take 200 mg by mouth 3 (three) times daily.   ferrous sulfate 325 (65 FE) MG tablet Take 325 mg by mouth every Monday, Wednesday, and Friday. Give with food   hydrocortisone 2.5 % cream Apply topically 2 (two) times daily.   methocarbamol 500 MG tablet Commonly known as: ROBAXIN Take 250 mg by mouth daily. As  needed   midodrine 5 MG tablet Commonly known as: PROAMATINE Take 5 mg by mouth 3 (three) times daily with meals.   mirabegron ER 50 MG Tb24 tablet Commonly known as: MYRBETRIQ Take 50 mg by mouth daily.   pantoprazole 40 MG tablet Commonly known as: PROTONIX Take 40 mg by  mouth daily.   polyethylene glycol 17 g packet Commonly known as: MIRALAX / GLYCOLAX Take 17 g by mouth daily.   PreviDent 5000 Booster Plus 1.1 % Pste Generic drug: Sodium Fluoride Place 1 application onto teeth in the morning and at bedtime.   rivaroxaban 20 MG Tabs tablet Commonly known as: XARELTO Take 20 mg by mouth every evening. Between the hours of 5pm-6pm.   simethicone 80 MG chewable tablet Commonly known as: Gas-X Chew 1 tablet (80 mg total) by mouth every 6 (six) hours as needed for flatulence.   Sodium Chloride 3 % Aers Place 1 spray into the nose 2 (two) times daily.   Vitamin D3 10 MCG (400 UNIT) Caps Take 1 capsule by mouth daily. Between the hours of 7am-10am.   zinc oxide 20 % ointment Apply 1 application topically as needed for irritation. To buttocks after every incontinent episode and as needed for redness. May keep at bedside.        Review of Systems  Constitutional:  Negative for activity change, appetite change and fever.  HENT:  Positive for hearing loss. Negative for congestion, nosebleeds and trouble swallowing.   Eyes:  Positive for visual disturbance.       C/o double vision  Respiratory:  Negative for shortness of breath.   Cardiovascular:  Positive for leg swelling.  Gastrointestinal:  Negative for abdominal pain and constipation.       Episodes of mid sternum hurting when she swallows, difficulty swallowing x 2 days, not today. Burping/gas pill relieves a little.   Genitourinary:  Positive for frequency. Negative for dysuria and urgency.  Musculoskeletal:  Positive for arthralgias, back pain and gait problem. Negative for myalgias.       Right hip pain is well  controlled. Lower back discomfort positional.   Skin:  Positive for wound.  Neurological:  Positive for tremors. Negative for speech difficulty and headaches.       Moves slow, fine tremor in fingers, burning sensation  in the R+ L great toes when touched by sheets, comes/goes  Psychiatric/Behavioral:  Negative for behavioral problems and sleep disturbance. The patient is nervous/anxious.        Sleeps from 9pm to 4-5am usually, feels anxious sometimes.    Immunization History  Administered Date(s) Administered   Influenza Split 10/20/2012   Influenza Whole 10/21/2006   Influenza, High Dose Seasonal PF 10/17/2016, 11/02/2019   Influenza-Unspecified 10/20/2013, 10/22/2017   Moderna Sars-Covid-2 Vaccination 01/22/2019, 02/19/2019, 11/29/2019, 06/19/2020   Pneumococcal Conjugate-13 01/31/2013   Pneumococcal Polysaccharide-23 01/20/2005, 11/24/2011   Td 01/21/2004   Tdap 03/16/2014   Zoster, Live 04/17/2009   Pertinent  Health Maintenance Due  Topic Date Due   INFLUENZA VACCINE  08/20/2020   DEXA SCAN  Completed   COLONOSCOPY (Pts 45-83yr Insurance coverage will need to be confirmed)  Discontinued   Fall Risk  05/03/2018 11/03/2017 08/04/2017 03/06/2017 11/26/2016  Falls in the past year? 1 No No Yes Yes  Number falls in past yr: 0 - - 2 or more 2 or more  Injury with Fall? 0 - - No No  Risk Factor Category  - - - High Fall Risk High Fall Risk  Risk for fall due to : Other (Comment) - - - -  Follow up Falls evaluation completed;Education provided;Falls prevention discussed - - Falls evaluation completed Falls evaluation completed   Functional Status Survey:    Vitals:   10/29/20 1403  BP: 110/70  Pulse: 67  Resp: 16  Temp: (!)  97.5 F (36.4 C)  SpO2: 97%  Weight: 132 lb 8 oz (60.1 kg)  Height: '5\' 8"'  (1.727 m)   Body mass index is 20.15 kg/m. Physical Exam Vitals and nursing note reviewed.  Constitutional:      Appearance: Normal appearance.  HENT:     Head:  Normocephalic and atraumatic.     Nose: No congestion or rhinorrhea.     Comments: Blood seen in the left nostril, no blood seen in the back of her mouth.     Mouth/Throat:     Mouth: Mucous membranes are moist.  Eyes:     Extraocular Movements: Extraocular movements intact.     Conjunctiva/sclera: Conjunctivae normal.     Pupils: Pupils are equal, round, and reactive to light.     Comments: Chronic double vision, see Ophthalmology 09/20/20  Cardiovascular:     Rate and Rhythm: Normal rate. Rhythm irregular.     Heart sounds: No murmur heard.    Comments: DP pulses present L>R Pulmonary:     Effort: Pulmonary effort is normal.     Breath sounds: No rales.  Abdominal:     General: Bowel sounds are normal.     Palpations: Abdomen is soft.     Tenderness: There is no abdominal tenderness.     Comments: Pain palpated in the epigastric region.   Musculoskeletal:     Cervical back: Normal range of motion and neck supple.     Right lower leg: Edema present.     Left lower leg: Edema present.     Comments: trace edema BLE. L>R  Skin:    General: Skin is warm and dry.     Comments: Anterior left lower shin above ankle skin tear, near resolution of infected wound, near resolution of erythema, warmth, discomfort. A small open area on scabbed over the wound, no odorous drainage.   Neurological:     General: No focal deficit present.     Mental Status: She is alert and oriented to person, place, and time. Mental status is at baseline.     Cranial Nerves: No cranial nerve deficit.     Motor: No weakness.     Coordination: Coordination abnormal.     Gait: Gait abnormal.     Comments: Moves slow, tremor in hands. No facial or limb weakness.   Psychiatric:        Mood and Affect: Mood normal.        Behavior: Behavior normal.    Labs reviewed: Recent Labs    03/23/20 0000 03/29/20 0000 05/23/20 0000  NA 141 141 139  K 4.5 4.5 4.3  CL 107 107 105  CO2 28* 23* 27*  BUN 17 17 23*   CREATININE 0.8 0.8 0.7  CALCIUM 9.2 9.4 9.7   Recent Labs    03/23/20 0000 03/29/20 0000 05/23/20 0000  AST 9* 10* 10*  ALT 3* 7 3*  ALKPHOS 58 61 60  ALBUMIN 3.5 3.8 4.2   Recent Labs    03/23/20 0000 03/29/20 0000 05/23/20 0000  WBC 4.5 7.5 7.4  NEUTROABS 2,934.00  --  6,083.00  HGB 11.5* 12.1 12.4  HCT 36 37 38  PLT 164 185 174   Lab Results  Component Value Date   TSH 1.30 03/29/2020   No results found for: HGBA1C Lab Results  Component Value Date   CHOL 185 03/24/2019   HDL 79 (A) 03/24/2019   LDLCALC 94 03/24/2019   LDLDIRECT 104.2 01/25/2013   TRIG 41  03/24/2019   CHOLHDL 2 08/29/2015    Significant Diagnostic Results in last 30 days:  No results found.  Assessment/Plan: GERD (gastroesophageal reflux disease) reported episodes of mid sternum hurting when she swallows, difficulty swallowing x2 days, belches and gas pill relieves a little, no occurrence with today. Denied nausea, vomiting, or abd pain. Hx of GERD, takes Protonix. will observe, ST to eval, diet modification?  Infected wound Slow healing anterior left lower leg above the ankle wound, small open areas, no purulent drainage, near resolution of redness, warmth, but swelling in hte left lower leg and ankle still more than the right. Will try Furosemide 87m, Kcl 257m qd x 3 days to decrease swelling/faciliate wound healing. Continue Bactroban oint x 10 days.   Atrial fibrillation (HCC) heart rate is in control, on Xarelto 209md, off Diltiazem. Hgb 12.4 05/23/20,  TSH 1.30 03/29/20  Edema of left lower extremity due to peripheral venous insufficiency LLE>RLLE due to the slow healing wound left lower leg.   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    Anemia stable, Hgb 12.4 05/23/20,  takes Fe  Urge incontinence of urine  takes Mirabegron.   Slow transit constipation takes MiraLFreight forwarderunication: plan of care reviewed with the patient and charge nurse.   Labs/tests ordered:  none  Time spend 35 minutes.

## 2020-10-29 NOTE — Assessment & Plan Note (Signed)
stable, Hgb 12.4 05/23/20,  takes Fe

## 2020-10-29 NOTE — Assessment & Plan Note (Signed)
improved, takes Midodrine.

## 2020-10-29 NOTE — Assessment & Plan Note (Signed)
LLE>RLLE due to the slow healing wound left lower leg.

## 2020-10-29 NOTE — Assessment & Plan Note (Signed)
takes MiraLax  

## 2020-10-29 NOTE — Assessment & Plan Note (Signed)
heart rate is in control, on Xarelto 20mg  qd, off Diltiazem. Hgb 12.4 05/23/20,  TSH 1.30 03/29/20

## 2020-10-29 NOTE — Assessment & Plan Note (Signed)
on Tylenol 1000mg  tid prn, Methocarbamol 250mg  qd.

## 2020-10-29 NOTE — Assessment & Plan Note (Signed)
reported episodes of mid sternum hurting when she swallows, difficulty swallowing x2 days, belches and gas pill relieves a little, no occurrence with today. Denied nausea, vomiting, or abd pain. Hx of GERD, takes Protonix. will observe, ST to eval, diet modification?

## 2020-10-31 DIAGNOSIS — Z993 Dependence on wheelchair: Secondary | ICD-10-CM | POA: Diagnosis not present

## 2020-10-31 DIAGNOSIS — Z79899 Other long term (current) drug therapy: Secondary | ICD-10-CM | POA: Diagnosis not present

## 2020-10-31 DIAGNOSIS — G20 Parkinson's disease: Secondary | ICD-10-CM | POA: Diagnosis not present

## 2020-11-01 ENCOUNTER — Encounter: Payer: Self-pay | Admitting: Nurse Practitioner

## 2020-11-06 ENCOUNTER — Encounter: Payer: Self-pay | Admitting: Internal Medicine

## 2020-11-06 ENCOUNTER — Non-Acute Institutional Stay (SKILLED_NURSING_FACILITY): Payer: Medicare PPO | Admitting: Internal Medicine

## 2020-11-06 DIAGNOSIS — H532 Diplopia: Secondary | ICD-10-CM

## 2020-11-06 DIAGNOSIS — G20 Parkinson's disease: Secondary | ICD-10-CM | POA: Diagnosis not present

## 2020-11-06 DIAGNOSIS — N3941 Urge incontinence: Secondary | ICD-10-CM

## 2020-11-06 DIAGNOSIS — G903 Multi-system degeneration of the autonomic nervous system: Secondary | ICD-10-CM | POA: Diagnosis not present

## 2020-11-06 DIAGNOSIS — D500 Iron deficiency anemia secondary to blood loss (chronic): Secondary | ICD-10-CM

## 2020-11-06 DIAGNOSIS — I480 Paroxysmal atrial fibrillation: Secondary | ICD-10-CM

## 2020-11-06 DIAGNOSIS — L89899 Pressure ulcer of other site, unspecified stage: Secondary | ICD-10-CM

## 2020-11-06 DIAGNOSIS — I872 Venous insufficiency (chronic) (peripheral): Secondary | ICD-10-CM

## 2020-11-06 NOTE — Progress Notes (Signed)
Location:   [facility] Room Number: [place] of Service:  SNF [phone]) Provider:  Veleta Miners, MD  [name], MD  Patient Care Team: [name], MD as PCP - General (Internal Medicine) [name], [name], MD as PCP - Cardiology (Cardiology) [name], [name], DO as Consulting Physician (Neurology) Mast, Man X, NP as Nurse Practitioner (Internal Medicine)  Extended Emergency Contact Information Primary Emergency Contact: Marveen, Donlon Mobile Phone: [phone] Relation: Daughter Interpreter needed? No Secondary Emergency Contact: [facility] Address: [address]          [place], [place] [address] [name] of [place] Phone: [phone] Mobile Phone: [phone] Relation: Son  Code Status:  DNR Goals of care: Advanced Directive information Advanced Directives 11/06/2020  Does Patient Have a Medical Advance Directive? Yes  Type of Advance Directive [place]  Does patient want to make changes to medical advance directive? No - Patient declined  Copy of [place] in Chart? Yes - validated most recent copy scanned in chart (See row information)  Would patient like information on creating a medical advance directive? -  Pre-existing out of facility DNR order (yellow form or pink MOST form) -     Chief Complaint  Patient presents with   Medical Management of Chronic Issues   Quality Metric Gaps    Zoster Shingrix,Flu, COVID Booster 5    HPI:  Pt is a 80 y.o. female seen today for medical management of chronic diseases.    Patient has a history of Parkinson disease diagnosed 2018  Paroxysmal A. fib on Xarelto, hyperlipidemia, H/o Ovarian Cyst, Lower extremity edema,2 D echo with N EF Biatrial enlargement   Urinary Incontinence  Hypotension  Doing well in SNF Sees Dr Buck Mam in [place]  Was seen recently in their office Daughter who lives in [place] was able to come and join for appointment Per notes no new changes were made to her parkinson Meds Patient feels Comtan has helped her Gait and Off times She works with therapy few days a week also tries to walk with the staff to and fro from [facility] room  Was treated with Antibiotics for LE wound with lasix and now on Bacitracin ointment Wound is clear no discharge or pain Healing slowly since 9/22   Past Medical History:  Diagnosis Date   Acute lower UTI 09/14/2018   Anemia 10/15/2018   2016 colonoscopy 10/14/18 wbc 5.4, Hgb 10.1, plt 184 12/01/18 wbc 4.5, Hgb 11.1, plt 188, neutrophils 63,  Na 139, K 3.7, Bun 23, creat 0.69, eGFR 83 on Fe, Hgb 11.7 12/09/18    Atrial fibrillation ([place]) 05/21/2010   10/07/18 Na 143, K 4.0, Bun 20, creat 0.79, eGFR 72, wbc 7.1, Hgb 10.9, plt 172 10/28/18 Na 142, K 4.0, Bun 19, creat 0.77, eGFR 74 11/18/18 Na 144, K 4.1, Bun 21, creat 0.69, eGFR 83   Bilateral lower extremity edema 07/19/2018   11/02/18 BMP 2 weeks.    Cervical pain (neck) 06/20/2010   Closed fracture of part of upper end of humerus 05/01/2015   Colles' fracture of right radius 03/05/2015   Constipation 07/19/2018   DEGENERATIVE JOINT DISEASE, RIGHT HIP 02/16/2007   Dupuytren's contracture    Dysuria 09/20/2018   09/20/18 c/o got up several times last night to go urinate, burning on urination, lower abd /back discomfort, but urinary frequency, leakage are not new. UA C/S, Pyridium  100mg tid x 2 days.  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   S/P  breast biopsy, left    two o'clock position - benign   Toxic effect of venom(989.5) 07/27/2007   Tremor, unspecified 10/19/2015   Unstable gait 02/16/2017   Vaginal atrophy 10/19/2015   Venous insufficiency    Weakness 09/12/2018   Past Surgical History:  Procedure Laterality Date   BREAST BIOPSY Left    CATARACT EXTRACTION, BILATERAL      Allergies  Allergen Reactions   Doxycycline Nausea And Vomiting   Sulfonamide Derivatives     REACTION: HIVES    Allergies as of 11/06/2020       Reactions   Doxycycline Nausea And Vomiting   Sulfonamide Derivatives    REACTION: HIVES        Medication List        Accurate as of November 06, 2020 11:36 AM. If you have any questions, ask your nurse or doctor.          acetaminophen 500 MG tablet Commonly known as: TYLENOL Take 1,000 mg by mouth 3 (three) times daily as needed.   calcium carbonate 750 MG chewable tablet Commonly known as: TUMS EX Chew 1 tablet by mouth daily. Between the hours of 7am-10am.   carbidopa-levodopa 25-100 MG tablet Commonly known as: SINEMET IR Take 2 tablets by mouth 5 (five) times daily.   carbidopa-levodopa 50-200 MG tablet Commonly known as: SINEMET CR Take 1 tablet by mouth at bedtime. 6.30 am 2 and 10 pm   Cranberry 450 MG Tabs Take 1 tablet by mouth daily.   diclofenac Sodium 1 % Gel Commonly known as: VOLTAREN Apply topically 3 (three) times daily as needed.   entacapone 200 MG tablet Commonly known as: COMTAN Take 200 mg by mouth 3 (three) times daily.   ferrous sulfate 325 (65 FE) MG tablet Take 325 mg by mouth every Monday, Wednesday, and Friday. Give with food   hydrocortisone 2.5 % cream Apply topically 2 (two) times daily.   methocarbamol 500 MG tablet Commonly known as: ROBAXIN Take 250 mg by mouth daily. As needed   midodrine 5 MG tablet Commonly known as: PROAMATINE Take 5 mg by mouth 3 (three) times daily with meals.   mirabegron ER 50 MG Tb24 tablet Commonly  known as: MYRBETRIQ Take 50 mg by mouth daily.   mupirocin ointment 2 % Commonly known as: BACTROBAN 1 application 2 (two) times daily.   pantoprazole 40 MG tablet Commonly known as: PROTONIX Take 40 mg by mouth daily.   polyethylene glycol 17 g packet Commonly known as: MIRALAX / GLYCOLAX Take 17 g by mouth daily.   PreviDent 5000 Booster Plus 1.1 % Pste Generic drug: Sodium Fluoride Place 1 application onto teeth in the morning and at bedtime.   rivaroxaban 20 MG Tabs tablet Commonly known as: XARELTO Take 20 mg by mouth every evening. Between the hours of 5pm-6pm.   simethicone 80 MG chewable tablet Commonly known as: Gas-X Chew 1 tablet (  80 mg total) by mouth every 6 (six) hours as needed for flatulence.   Sodium Chloride 3 % Aers Place 1 spray into the nose 2 (two) times daily.   Vitamin D3 10 MCG (400 UNIT) Caps Take 1 capsule by mouth daily. Between the hours of 7am-10am.   zinc oxide 20 % ointment Apply 1 application topically as needed for irritation. To buttocks after every incontinent episode and as needed for redness. May keep at bedside.        Review of Systems  Constitutional: Negative.   HENT: Negative.    Respiratory: Negative.    Cardiovascular:  Positive for leg swelling.  Gastrointestinal:  Positive for constipation.  Genitourinary: Negative.   Musculoskeletal:  Positive for gait problem.  Skin:  Positive for wound.  Neurological:  Negative for dizziness.  Psychiatric/Behavioral: Negative.     Immunization History  Administered Date(s) Administered   Influenza Split 10/20/2012   Influenza Whole 10/21/2006   Influenza, High Dose Seasonal PF 10/17/2016, 11/02/2019   Influenza-Unspecified 10/20/2013, 10/22/2017   Moderna Sars-Covid-2 Vaccination 01/22/2019, 02/19/2019, 11/29/2019, 06/19/2020   Pneumococcal Conjugate-13 01/31/2013   Pneumococcal Polysaccharide-23 01/20/2005, 11/24/2011   Td 01/21/2004   Tdap 03/16/2014   Zoster, Live  04/17/2009   Pertinent  Health Maintenance Due  Topic Date Due   INFLUENZA VACCINE  08/20/2020   DEXA SCAN  Completed   COLONOSCOPY (Pts 45-49yrs Insurance coverage will need to be confirmed)  Discontinued   Fall Risk  05/03/2018 11/03/2017 08/04/2017 03/06/2017 11/26/2016  Falls in the past year? 1 No No Yes Yes  Number falls in past yr: 0 - - 2 or more 2 or more  Injury with Fall? 0 - - No No  Risk Factor Category  - - - High Fall Risk High Fall Risk  Risk for fall due to : Other (Comment) - - - -  Follow up Falls evaluation completed;Education provided;Falls prevention discussed - - Falls evaluation completed Falls evaluation completed   Functional Status Survey:    Vitals:   11/06/20 1113  BP: 103/64  Pulse: 81  Resp: 16  Temp: 97.6 F (36.4 C)  SpO2: 97%  Weight: 133 lb 12.8 oz (60.7 kg)  Height: 5' 8" (1.727 m)   Body mass index is 20.34 kg/m. Physical Exam Vitals reviewed.  Constitutional:      Appearance: Normal appearance.  HENT:     Head: Normocephalic.     Nose: Nose normal.     Mouth/Throat:     Mouth: Mucous membranes are moist.     Pharynx: Oropharynx is clear.  Eyes:     Pupils: Pupils are equal, round, and reactive to light.  Cardiovascular:     Rate and Rhythm: Normal rate. Rhythm irregular.     Pulses: Normal pulses.  Pulmonary:     Effort: Pulmonary effort is normal.     Breath sounds: Normal breath sounds.  Abdominal:     General: Abdomen is flat. Bowel sounds are normal.     Palpations: Abdomen is soft.  Musculoskeletal:        General: Swelling present.     Cervical back: Neck supple.  Skin:    General: Skin is warm.     Comments: LLE Wound which is Clean Base No Necrosis or Discharge. No Odor  Neurological:     General: No focal deficit present.     Mental Status: She is alert.  Psychiatric:        Mood and Affect: Mood normal.          Thought Content: Thought content normal.    Labs reviewed: Recent Labs    03/29/20 0000  05/23/20 0000 10/12/20 0000  NA 141 139 141  K 4.5 4.3 4.2  CL 107 105 109*  CO2 23* 27* 27*  BUN 17 23* 20  CREATININE 0.8 0.7 0.7  CALCIUM 9.4 9.7 9.0   Recent Labs    03/29/20 0000 05/23/20 0000 10/12/20 0000  AST 10* 10* 11*  ALT 7 3* 5*  ALKPHOS 61 60 53  ALBUMIN 3.8 4.2 3.6   Recent Labs    03/23/20 0000 03/29/20 0000 05/23/20 0000 10/12/20 0000  WBC 4.5 7.5 7.4 4.1  NEUTROABS 2,934.00  --  6,083.00 2,817.00  HGB 11.5* 12.1 12.4 11.0*  HCT 36 37 38 35*  PLT 164 185 174 160   Lab Results  Component Value Date   TSH 1.30 03/29/2020   No results found for: HGBA1C Lab Results  Component Value Date   CHOL 185 03/24/2019   HDL 79 (A) 03/24/2019   LDLCALC 94 03/24/2019   LDLDIRECT 104.2 01/25/2013   TRIG 41 03/24/2019   CHOLHDL 2 08/29/2015    Significant Diagnostic Results in last 30 days:  No results found.  Assessment/Plan Primary parkinsonism (HCC) Follows with [facility] On Sinemet and Comtan. Seems Pleased and stable right now Continues to work with staff and therapy to keep her Mobility  Edema of left lower extremity due to peripheral venous insufficiency Use Lasix Prn due to her Low BP Ted hoses and Elevate Skin tear wound Does not look infected Reassures Using Topical Bacitracin PAF On Xarleto Taken off other meds due to Low BP Urge incontinence of urine On Myrbetriq Iron deficiency anemia due to chronic blood loss Continue Iron NO further work up  Neurogenic orthostatic hypotension (HCC) Continue Midodrine Double vision with both eyes open Follows with Opthalmology Neck Cramps Uses Tylenol pRn Gastroesophageal reflux disease, unspecified whether esophagitis present On Protonix Family/ staff Communication:   Labs/tests ordered:      

## 2020-11-09 ENCOUNTER — Other Ambulatory Visit: Payer: Self-pay | Admitting: Internal Medicine

## 2020-11-09 ENCOUNTER — Other Ambulatory Visit: Payer: Self-pay | Admitting: Orthopedic Surgery

## 2020-11-09 DIAGNOSIS — R140 Abdominal distension (gaseous): Secondary | ICD-10-CM

## 2020-11-12 ENCOUNTER — Other Ambulatory Visit: Payer: Self-pay | Admitting: Internal Medicine

## 2020-11-12 NOTE — Telephone Encounter (Signed)
Per documentation in Dr.Gupta's last ov not this is a skilled patient.  Refills for skilled patient's are addressed by the facility staff

## 2020-11-13 ENCOUNTER — Encounter: Payer: Self-pay | Admitting: Internal Medicine

## 2020-11-13 DIAGNOSIS — L570 Actinic keratosis: Secondary | ICD-10-CM | POA: Diagnosis not present

## 2020-11-13 DIAGNOSIS — L821 Other seborrheic keratosis: Secondary | ICD-10-CM | POA: Diagnosis not present

## 2020-11-13 DIAGNOSIS — S81809D Unspecified open wound, unspecified lower leg, subsequent encounter: Secondary | ICD-10-CM | POA: Diagnosis not present

## 2020-11-13 DIAGNOSIS — D692 Other nonthrombocytopenic purpura: Secondary | ICD-10-CM | POA: Diagnosis not present

## 2020-11-13 DIAGNOSIS — L814 Other melanin hyperpigmentation: Secondary | ICD-10-CM | POA: Diagnosis not present

## 2020-11-14 ENCOUNTER — Encounter: Payer: Self-pay | Admitting: Orthopedic Surgery

## 2020-11-14 ENCOUNTER — Non-Acute Institutional Stay (SKILLED_NURSING_FACILITY): Payer: Medicare PPO | Admitting: Orthopedic Surgery

## 2020-11-14 DIAGNOSIS — D500 Iron deficiency anemia secondary to blood loss (chronic): Secondary | ICD-10-CM | POA: Diagnosis not present

## 2020-11-14 DIAGNOSIS — G903 Multi-system degeneration of the autonomic nervous system: Secondary | ICD-10-CM | POA: Diagnosis not present

## 2020-11-14 DIAGNOSIS — I480 Paroxysmal atrial fibrillation: Secondary | ICD-10-CM | POA: Diagnosis not present

## 2020-11-14 DIAGNOSIS — L089 Local infection of the skin and subcutaneous tissue, unspecified: Secondary | ICD-10-CM

## 2020-11-14 DIAGNOSIS — R300 Dysuria: Secondary | ICD-10-CM

## 2020-11-14 DIAGNOSIS — K219 Gastro-esophageal reflux disease without esophagitis: Secondary | ICD-10-CM

## 2020-11-14 DIAGNOSIS — G20 Parkinson's disease: Secondary | ICD-10-CM

## 2020-11-14 DIAGNOSIS — I872 Venous insufficiency (chronic) (peripheral): Secondary | ICD-10-CM

## 2020-11-14 DIAGNOSIS — K5901 Slow transit constipation: Secondary | ICD-10-CM

## 2020-11-14 DIAGNOSIS — T148XXA Other injury of unspecified body region, initial encounter: Secondary | ICD-10-CM

## 2020-11-14 DIAGNOSIS — R1310 Dysphagia, unspecified: Secondary | ICD-10-CM | POA: Diagnosis not present

## 2020-11-14 NOTE — Progress Notes (Signed)
Location:   [facility] Room Number: 13 Place of Service:  SNF [phone]) Provider:  Windell Moulding, NP  [name], MD  Patient Care Team: [name], MD as PCP - General (Internal Medicine) [name], [name], MD as PCP - Cardiology (Cardiology) [name], [name], DO as Consulting Physician (Neurology) Mast, Man X, NP as Nurse Practitioner (Internal Medicine)  Extended Emergency Contact Information Primary Emergency Contact: Milyn, [place] Mobile Phone: [phone] Relation: Daughter Interpreter needed? No Secondary Emergency Contact: [facility] Address: [address]          [place], [place] [address] [name] of [place] Phone: [phone] Mobile Phone: [phone] Relation: Son  Code Status:  DNR Goals of care: Advanced Directive information Advanced Directives 11/14/2020  Does Patient Have a Medical Advance Directive? Yes  Type of Advance Directive [place]  Does patient want to make changes to medical advance directive? No - Patient declined  Copy of [place] in Chart? Yes - validated most recent copy scanned in chart (See row information)  Would patient like information on creating a medical advance directive? -  Pre-existing out of facility DNR order (yellow form or pink MOST form) -     Chief Complaint  Patient presents with   Acute Visit    Dysuria    HPI:  Pt is a 80 y.o. female seen today for an acute visit for dysuria.   She continues to reside on the skilled nursing unit at [facility]. Past medical history: afib, BLE edema, hypotension, dysphagia, GERD, constipation, parkinsonism, peripheral neuropathy, OA, OAB, and unstable gait.   Symptoms of dysuria and bladder discomfort began yesterday. Describes urine as dark yellow with slight odor.She has a history of OAB, remains on Mrybetriq daily. She does not hydrate well throughout the day. Afebrile, vitals stable. Treatment  options discussed, encouraged to hydrate more with water. Requesting pyridium for burning.   Parkinson's- in wheelchair today, remains on sinemet, staff administering appropriately Dysphagia- no recent aspirations, remains on regular diet and thin liquids Atrial Fib- rate controlled without medication, Xarelto for clot prevention Chronic BLE- continues to use ted hose and elevates legs every afternoon for a few hours Hypotension- SBP> 100, remains on midodrine Anemia- hgb 11.0 10/12/2020, remains on ferrous sulfate Constipation - LBM 10/26, remains on prune juice and miralax daily GERD- improved after seeing ST, remains on Protonix daily LLE wound- wound closed, symptoms improved with 5 day course of doxycycline and Bactroban x 10 days    Past Medical History:  Diagnosis Date   Acute lower UTI 09/14/2018   Anemia 10/15/2018   2016 colonoscopy 10/14/18 wbc 5.4, Hgb 10.1, plt 184 12/01/18 wbc 4.5, Hgb 11.1, plt 188, neutrophils 63,  Na 139, K 3.7, Bun 23, creat 0.69, eGFR 83 on Fe, Hgb 11.7 12/09/18    Atrial fibrillation ([place]) 05/21/2010   10/07/18 Na 143, K 4.0, Bun 20, creat 0.79, eGFR 72, wbc 7.1, Hgb 10.9, plt 172 10/28/18 Na 142, K 4.0, Bun 19, creat 0.77, eGFR 74 11/18/18 Na 144, K 4.1, Bun 21, creat 0.69, eGFR 83   Bilateral lower extremity edema 07/19/2018   11/02/18 BMP 2 weeks.    Cervical pain (neck) 06/20/2010   Closed fracture of part of upper end of humerus 05/01/2015   Colles' fracture of right radius 03/05/2015   Constipation 07/19/2018   DEGENERATIVE JOINT DISEASE, RIGHT HIP 02/16/2007   Dupuytren's contracture    Dysuria 09/20/2018  09/20/18 c/o got up several times last night to go urinate, burning on urination, lower abd /back discomfort, but urinary frequency, leakage are not new. UA C/S, Pyridium 117m tid x 2 days.  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/2017   S/P breast biopsy, left    two o'clock position - benign   Toxic effect of venom(989.5) 07/27/2007   Tremor, unspecified 10/19/2015   Unstable gait 02/16/2017   Vaginal atrophy 10/19/2015   Venous insufficiency    Weakness 09/12/2018   Past Surgical History:  Procedure Laterality Date   BREAST BIOPSY Left    CATARACT EXTRACTION, BILATERAL      Allergies  Allergen Reactions   Doxycycline Nausea And Vomiting   Sulfonamide Derivatives     REACTION: HIVES    Allergies as of 11/14/2020       Reactions   Doxycycline Nausea And Vomiting   Sulfonamide Derivatives    REACTION: HIVES        Medication List        Accurate as of November 14, 2020  3:25 PM. If you have any questions, ask your nurse or doctor.          STOP taking these medications    mupirocin ointment 2 % Commonly known as: BACTROBAN Stopped by: AYvonna Alanis NP       TAKE these medications    acetaminophen 500 MG tablet Commonly known as: TYLENOL Take 1,000 mg by mouth 3 (three) times daily as needed.   calcium carbonate 750 MG chewable tablet Commonly known as: TUMS EX Chew 1 tablet by mouth daily. Between the hours of 7am-10am.   carbidopa-levodopa 25-100 MG tablet Commonly known as: SINEMET IR Take 2 tablets by mouth 5 (five) times daily.   carbidopa-levodopa 50-200 MG tablet Commonly known as: SINEMET CR Take 1 tablet by mouth at bedtime. 6.30 am 2 and 10 pm   Cranberry 450 MG Tabs Take 1 tablet by mouth daily.   diclofenac Sodium 1 % Gel Commonly known as: VOLTAREN Apply topically 3 (three) times daily as needed.   entacapone 200 MG tablet Commonly known  as: COMTAN Take 200 mg by mouth 3 (three) times daily.   ferrous sulfate 325 (65 FE) MG tablet Take 325 mg by mouth every Monday, Wednesday, and Friday. Give with food   hydrocortisone 2.5 % cream Apply topically 2 (two) times daily.   methocarbamol 500 MG tablet Commonly known as: ROBAXIN Take 250 mg by mouth daily. As needed   midodrine 5 MG tablet Commonly known as: PROAMATINE Take 5 mg by mouth 3 (three) times daily with meals.   mirabegron ER 50 MG Tb24 tablet Commonly known as: MYRBETRIQ Take 50 mg by mouth daily.   pantoprazole 40 MG tablet Commonly known as: PROTONIX Take 40 mg by mouth daily.   polyethylene glycol 17 g packet Commonly known as: MIRALAX / GLYCOLAX Take 17 g by mouth daily.   PreviDent 5000 Booster Plus 1.1 % Pste Generic drug: Sodium Fluoride Place 1  application onto teeth in the morning and at bedtime.   rivaroxaban 20 MG Tabs tablet Commonly known as: XARELTO Take 20 mg by mouth every evening. Between the hours of 5pm-6pm.   simethicone 80 MG chewable tablet Commonly known as: Gas-X Chew 1 tablet (80 mg total) by mouth every 6 (six) hours as needed for flatulence.   Sodium Chloride 3 % Aers Place 1 spray into the nose 2 (two) times daily.   Vitamin D3 10 MCG (400 UNIT) Caps Take 1 capsule by mouth daily. Between the hours of 7am-10am.   zinc oxide 20 % ointment Apply 1 application topically as needed for irritation. To buttocks after every incontinent episode and as needed for redness. May keep at bedside.        Review of Systems  Constitutional:  Negative for activity change, appetite change, chills, fatigue and fever.  HENT:  Positive for trouble swallowing. Negative for congestion.   Eyes:  Negative for visual disturbance.  Respiratory:  Negative for cough, shortness of breath and wheezing.   Cardiovascular:  Positive for leg swelling. Negative for chest pain.  Gastrointestinal:  Positive for constipation. Negative for  abdominal distention, abdominal pain, blood in stool, diarrhea, nausea and vomiting.  Genitourinary:  Positive for dysuria, frequency and urgency. Negative for hematuria.  Musculoskeletal:  Positive for gait problem and myalgias.  Skin:  Negative for wound.  Neurological:  Positive for tremors and weakness. Negative for dizziness and headaches.  Psychiatric/Behavioral:  Negative for confusion and dysphoric mood. The patient is not nervous/anxious.    Immunization History  Administered Date(s) Administered   Influenza Split 10/20/2012   Influenza Whole 10/21/2006   Influenza, High Dose Seasonal PF 10/17/2016, 11/02/2019   Influenza-Unspecified 10/20/2013, 10/22/2017   Moderna Sars-Covid-2 Vaccination 01/22/2019, 02/19/2019, 11/29/2019, 06/19/2020   Pneumococcal Conjugate-13 01/31/2013   Pneumococcal Polysaccharide-23 01/20/2005, 11/24/2011   Td 01/21/2004   Tdap 03/16/2014   Zoster, Live 04/17/2009   Pertinent  Health Maintenance Due  Topic Date Due   INFLUENZA VACCINE  08/20/2020   DEXA SCAN  Completed   COLONOSCOPY (Pts 45-55yr Insurance coverage will need to be confirmed)  Discontinued   Fall Risk 09/11/2018 09/12/2018 09/12/2018 09/13/2018 09/14/2018  Falls in the past year? - - - - -  Was there an injury with Fall? - - - - -  Fall Risk Category Calculator - - - - -  Fall Risk Category - - - - -  Patient Fall Risk Level High fall risk High fall risk High fall risk High fall risk High fall risk  Patient at Risk for Falls Due to - - - - -  Fall risk Follow up - - - - -   Functional Status Survey:    Vitals:   11/14/20 1512  BP: 98/69  Pulse: 82  Resp: 18  Temp: 97.8 F (36.6 C)  SpO2: 97%  Weight: 130 lb 12.8 oz (59.3 kg)  Height: '5\' 8"'  (1.727 m)   Body mass index is 19.89 kg/m. Physical Exam Vitals reviewed.  Constitutional:      General: She is not in acute distress. HENT:     Head: Normocephalic.  Eyes:     General:        Right eye: No discharge.         Left eye: No discharge.  Neck:     Vascular: No carotid bruit.  Cardiovascular:     Rate and Rhythm: Normal rate. Rhythm irregular.     Pulses: Normal pulses.  Heart sounds: Normal heart sounds. No murmur heard. Pulmonary:     Effort: Pulmonary effort is normal. No respiratory distress.     Breath sounds: Normal breath sounds. No wheezing.  Abdominal:     General: Bowel sounds are normal. There is no distension.     Palpations: Abdomen is soft.     Tenderness: There is no abdominal tenderness.  Musculoskeletal:     Cervical back: Normal range of motion.     Right lower leg: Edema present.     Left lower leg: Edema present.     Comments: Non-pitting  Lymphadenopathy:     Cervical: No cervical adenopathy.  Skin:    General: Skin is warm and dry.     Capillary Refill: Capillary refill takes less than 2 seconds.     Comments: LLE wound healed, slight skin discoloration noted.   Neurological:     General: No focal deficit present.     Mental Status: She is alert and oriented to person, place, and time.     Comments: Resting hand tremor, coordination slow, no limb weakness  Psychiatric:        Mood and Affect: Mood normal.        Behavior: Behavior normal.    Labs reviewed: Recent Labs    03/29/20 0000 05/23/20 0000 10/12/20 0000  NA 141 139 141  K 4.5 4.3 4.2  CL 107 105 109*  CO2 23* 27* 27*  BUN 17 23* 20  CREATININE 0.8 0.7 0.7  CALCIUM 9.4 9.7 9.0   Recent Labs    03/29/20 0000 05/23/20 0000 10/12/20 0000  AST 10* 10* 11*  ALT 7 3* 5*  ALKPHOS 61 60 53  ALBUMIN 3.8 4.2 3.6   Recent Labs    03/23/20 0000 03/29/20 0000 05/23/20 0000 10/12/20 0000  WBC 4.5 7.5 7.4 4.1  NEUTROABS 2,934.00  --  6,083.00 2,817.00  HGB 11.5* 12.1 12.4 11.0*  HCT 36 37 38 35*  PLT 164 185 174 160   Lab Results  Component Value Date   TSH 1.30 03/29/2020   No results found for: HGBA1C Lab Results  Component Value Date   CHOL 185 03/24/2019   HDL 79 (A) 03/24/2019    LDLCALC 94 03/24/2019   LDLDIRECT 104.2 01/25/2013   TRIG 41 03/24/2019   CHOLHDL 2 08/29/2015    Significant Diagnostic Results in last 30 days:  No results found.  Assessment/Plan 1. Dysuria - symptoms began yesterday - urine described as dark yellow with odor - does not drink water well - UA/culture - Pyridium 100 mg po TID x 3 days- start after collecting urine sample - encourage fluids   2. Primary parkinsonism ([place]) - moving well today - cont sinemet and Comtan - cont skilled nursing care  3. Dysphagia, unspecified type - no recent aspirations, reports some heartburn when swallowing - cont regular diet  4. Paroxysmal atrial fibrillation (HCC) - rate controlled with medication  - cont xarelto for clot prevention  5. Edema of left lower extremity due to peripheral venous insufficiency - non-pitting edema - cont ted hose and leg elevation  6. Neurogenic orthostatic hypotension (HCC) - cont midodrine  7. Iron deficiency anemia due to chronic blood loss - hgb 11.0 10/12/2020 - cont ferrous sulfate  8. Slow transit constipation - LBM 10/26 - cont miralax and senna  9. Gastroesophageal reflux disease, unspecified whether esophagitis present - cont Protonix  10. Infected wound - resolved    Family/ staff Communication: plan discussed  with patient and nurse  Labs/tests ordered:   UA/urine culture

## 2020-11-15 DIAGNOSIS — N390 Urinary tract infection, site not specified: Secondary | ICD-10-CM | POA: Diagnosis not present

## 2020-11-19 ENCOUNTER — Encounter: Payer: Self-pay | Admitting: Nurse Practitioner

## 2020-11-19 ENCOUNTER — Non-Acute Institutional Stay (SKILLED_NURSING_FACILITY): Payer: Medicare PPO | Admitting: Nurse Practitioner

## 2020-11-19 DIAGNOSIS — N3941 Urge incontinence: Secondary | ICD-10-CM | POA: Diagnosis not present

## 2020-11-19 DIAGNOSIS — G903 Multi-system degeneration of the autonomic nervous system: Secondary | ICD-10-CM

## 2020-11-19 DIAGNOSIS — K219 Gastro-esophageal reflux disease without esophagitis: Secondary | ICD-10-CM

## 2020-11-19 DIAGNOSIS — K5901 Slow transit constipation: Secondary | ICD-10-CM | POA: Diagnosis not present

## 2020-11-19 DIAGNOSIS — I480 Paroxysmal atrial fibrillation: Secondary | ICD-10-CM

## 2020-11-19 DIAGNOSIS — M159 Polyosteoarthritis, unspecified: Secondary | ICD-10-CM

## 2020-11-19 DIAGNOSIS — I872 Venous insufficiency (chronic) (peripheral): Secondary | ICD-10-CM

## 2020-11-19 DIAGNOSIS — D500 Iron deficiency anemia secondary to blood loss (chronic): Secondary | ICD-10-CM

## 2020-11-19 DIAGNOSIS — G20 Parkinson's disease: Secondary | ICD-10-CM | POA: Diagnosis not present

## 2020-11-19 DIAGNOSIS — M150 Primary generalized (osteo)arthritis: Secondary | ICD-10-CM

## 2020-11-19 DIAGNOSIS — G20C Parkinsonism, unspecified: Secondary | ICD-10-CM

## 2020-11-19 NOTE — Assessment & Plan Note (Signed)
stable, Hgb 11.0 10/12/20,  takes Fe

## 2020-11-19 NOTE — Assessment & Plan Note (Signed)
symptoms improved, on Sinemet, Comtan, f/u neurology at [facility]. double vision, chronic, saw Ophthalmology

## 2020-11-19 NOTE — Assessment & Plan Note (Signed)
improved, takes Midodrine.

## 2020-11-19 NOTE — Assessment & Plan Note (Signed)
Stable, continue MiraLax.  °

## 2020-11-19 NOTE — Assessment & Plan Note (Signed)
Stable, continue Pantoprazole.  

## 2020-11-19 NOTE — Assessment & Plan Note (Signed)
on Tylenol, Methocarbamol 250mg  qd.

## 2020-11-19 NOTE — Assessment & Plan Note (Signed)
Urge incontinent of urine, takes Mirabegron.

## 2020-11-19 NOTE — Progress Notes (Signed)
Location:   SNF [place] Room Number: 13 Place of Service:  SNF (31) Provider: [facility] Jhan Conery NP  [name], MD  Patient Care Team: [name], MD as PCP - General (Internal Medicine) [name], [name], MD as PCP - Cardiology (Cardiology) [name], [name], DO as Consulting Physician (Neurology) Verbena Boeding X, NP as Nurse Practitioner (Internal Medicine)  Extended Emergency Contact Information Primary Emergency Contact: Jack, [place] Mobile Phone: [phone] Relation: Daughter Interpreter needed? No Secondary Emergency Contact: [facility] Address: [address]          [place], [place] [address] [name] of [place] Phone: [phone] Mobile Phone: [phone] Relation: Son  Code Status: DNR Goals of care: Advanced Directive information Advanced Directives 11/19/2020  Does Patient Have a Medical Advance Directive? Yes  Type of Advance Directive [place]  Does patient want to make changes to medical advance directive? No - Patient declined  Copy of [place] in Chart? Yes - validated most recent copy scanned in chart (See row information)  Would patient like information on creating a medical advance directive? -  Pre-existing out of facility DNR order (yellow form or pink MOST form) -     Chief Complaint  Patient presents with   Acute Visit    UTI    HPI:  Pt is a 80 y.o. female seen today for an acute visit for UTI, urine culture 11/15/20 Proteus Mirabilis >100,000c/ml, persisted dysuria and supra pubic discomfort. She is afebrile.   GERD, takes Protonix             The patient has chronic edema BLE. Off Metolazone 09/27/19.             Afib, heart rate is in control, on Xarelt,  off Diltiazem. Hgb 12.4 05/23/20,  TSH 1.30 03/29/20             OA,  on Tylenol, Methocarbamol 267m qd.                Parkinson's, symptoms improved, on Sinemet, Comtan, f/u neurology at [facility] double vision, chronic, saw  Ophthalmology              Orthostatic hypotension, improved, takes Midodrine.              Anemia, stable, Hgb 11.0 10/12/20,  takes Fe             Urge incontinent of urine, takes Mirabegron.              Constipation, takes MiraLax      Past Medical History:  Diagnosis Date   Acute lower UTI 09/14/2018   Anemia 10/15/2018   2016 colonoscopy 10/14/18 wbc 5.4, Hgb 10.1, plt 184 12/01/18 wbc 4.5, Hgb 11.1, plt 188, neutrophils 63,  Na 139, K 3.7, Bun 23, creat 0.69, eGFR 83 on Fe, Hgb 11.7 12/09/18    Atrial fibrillation ([place] 05/21/2010   10/07/18 Na 143, K 4.0, Bun 20, creat 0.79, eGFR 72, wbc 7.1, Hgb 10.9, plt 172 10/28/18 Na 142, K 4.0, Bun 19, creat 0.77, eGFR 74 11/18/18 Na 144, K 4.1, Bun 21, creat 0.69, eGFR 83   Bilateral lower extremity edema 07/19/2018   11/02/18 BMP 2 weeks.    Cervical pain (neck) 06/20/2010   Closed fracture of part of upper end of humerus 05/01/2015   Colles' fracture of right radius 03/05/2015   Constipation 07/19/2018   DEGENERATIVE JOINT DISEASE, RIGHT HIP 02/16/2007  Dupuytren's contracture    Dysuria 09/20/2018   09/20/18 c/o got up several times last night to go urinate, burning on urination, lower abd /back discomfort, but urinary frequency, leakage are not new. UA C/S, Pyridium 149m tid x 2 days.  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   S/P breast biopsy, left    two o'clock position - benign   Toxic effect of venom(989.5) 07/27/2007   Tremor, unspecified 10/19/2015   Unstable gait 02/16/2017   Vaginal atrophy  10/19/2015   Venous insufficiency    Weakness 09/12/2018   Past Surgical History:  Procedure Laterality Date   BREAST BIOPSY Left    CATARACT EXTRACTION, BILATERAL      Allergies  Allergen Reactions   Doxycycline Nausea And Vomiting   Sulfonamide Derivatives     REACTION: HIVES    Allergies as of 11/19/2020       Reactions   Doxycycline Nausea And Vomiting   Sulfonamide Derivatives    REACTION: HIVES        Medication List        Accurate as of November 19, 2020 11:59 PM. If you have any questions, ask your nurse or doctor.          acetaminophen 500 MG tablet Commonly known as: TYLENOL Take 1,000 mg by mouth 3 (three) times daily as needed.   amoxicillin 500 MG tablet Commonly known as: AMOXIL Take 500 mg by mouth every 8 (eight) hours.   calcium carbonate 750 MG chewable tablet Commonly known as: TUMS EX Chew 1 tablet by mouth daily. Between the hours of 7am-10am.   carbidopa-levodopa 25-100 MG tablet Commonly known as: SINEMET IR Take 2 tablets by mouth 5 (five) times daily.   carbidopa-levodopa 50-200 MG tablet Commonly known as: SINEMET CR Take 1 tablet by mouth at bedtime. 6.30 am 2 and 10 pm   Cranberry 450 MG Tabs Take 1 tablet by mouth daily.   diclofenac Sodium 1 % Gel Commonly known as: VOLTAREN Apply topically 3 (three) times daily as needed.   entacapone 200 MG tablet Commonly known as: COMTAN Take 200 mg by mouth 3 (three) times daily.   ferrous sulfate 325 (65 FE) MG tablet Take 325 mg by mouth every Monday, Wednesday, and Friday. Give with food   hydrocortisone 2.5 % cream Apply topically 2 (two) times daily.   methocarbamol 500 MG tablet Commonly known as: ROBAXIN Take 250 mg by mouth daily. As needed   midodrine 5 MG tablet Commonly known as: PROAMATINE Take 5 mg by mouth 3 (three) times daily with meals.   mirabegron ER 50 MG Tb24 tablet Commonly known as: MYRBETRIQ Take 50 mg by mouth daily.   pantoprazole 40 MG  tablet Commonly known as: PROTONIX Take 40 mg by mouth daily.   polyethylene glycol 17 g packet Commonly known as: MIRALAX / GLYCOLAX Take 17 g by mouth daily.   PreviDent 5000 Booster Plus 1.1 % Pste Generic drug: Sodium Fluoride Place 1 application onto teeth in the morning  and at bedtime.   rivaroxaban 20 MG Tabs tablet Commonly known as: XARELTO Take 20 mg by mouth every evening. Between the hours of 5pm-6pm.   saccharomyces boulardii 250 MG capsule Commonly known as: FLORASTOR Take 250 mg by mouth 2 (two) times daily.   simethicone 80 MG chewable tablet Commonly known as: Gas-X Chew 1 tablet (80 mg total) by mouth every 6 (six) hours as needed for flatulence.   Sodium Chloride 3 % Aers Place 1 spray into the nose 2 (two) times daily.   Vitamin D3 10 MCG (400 UNIT) Caps Take 1 capsule by mouth daily. Between the hours of 7am-10am.   zinc oxide 20 % ointment Apply 1 application topically as needed for irritation. To buttocks after every incontinent episode and as needed for redness. May keep at bedside.        Review of Systems  Constitutional:  Negative for activity change, appetite change and fever.  HENT:  Positive for hearing loss. Negative for congestion, nosebleeds and trouble swallowing.   Eyes:  Positive for visual disturbance.       C/o double vision  Respiratory:  Negative for shortness of breath.   Cardiovascular:  Positive for leg swelling.  Gastrointestinal:  Negative for abdominal pain and constipation.       Suprapubic region discomfort.   Genitourinary:  Positive for dysuria, frequency and urgency. Negative for difficulty urinating and hematuria.  Musculoskeletal:  Positive for arthralgias, back pain and gait problem. Negative for myalgias.       Right hip pain is well controlled. Lower back discomfort positional.   Skin:  Negative for color change.  Neurological:  Positive for tremors. Negative for speech difficulty and headaches.       Moves  slow, fine tremor in fingers, burning sensation  in the R+ L great toes when touched by sheets, comes/goes  Psychiatric/Behavioral:  Negative for behavioral problems and sleep disturbance. The patient is nervous/anxious.        Sleeps from 9pm to 4-5am usually, feels anxious sometimes.    Immunization History  Administered Date(s) Administered   Influenza Split 10/20/2012   Influenza Whole 10/21/2006   Influenza, High Dose Seasonal PF 10/17/2016, 11/02/2019   Influenza-Unspecified 10/20/2013, 10/22/2017   Moderna Sars-Covid-2 Vaccination 01/22/2019, 02/19/2019, 11/29/2019, 06/19/2020   Pneumococcal Conjugate-13 01/31/2013   Pneumococcal Polysaccharide-23 01/20/2005, 11/24/2011   Td 01/21/2004   Tdap 03/16/2014   Zoster, Live 04/17/2009   Pertinent  Health Maintenance Due  Topic Date Due   INFLUENZA VACCINE  08/20/2020   DEXA SCAN  Completed   COLONOSCOPY (Pts 45-9yr Insurance coverage will need to be confirmed)  Discontinued   Fall Risk 09/11/2018 09/12/2018 09/12/2018 09/13/2018 09/14/2018  Falls in the past year? - - - - -  Was there an injury with Fall? - - - - -  Fall Risk Category Calculator - - - - -  Fall Risk Category - - - - -  Patient Fall Risk Level High fall risk High fall risk High fall risk High fall risk High fall risk  Patient at Risk for Falls Due to - - - - -  Fall risk Follow up - - - - -   Functional Status Survey:    Vitals:   11/19/20 1400  BP: 98/66  Pulse: 81  Temp: (!) 97.4 F (36.3 C)  SpO2: 96%  Weight: 130 lb 12.8 oz (59.3 kg)  Height: '5\' 8"'  (1.727 m)   Body mass index is 19.89 kg/m. Physical Exam Vitals  and nursing note reviewed.  Constitutional:      Appearance: Normal appearance.  HENT:     Head: Normocephalic and atraumatic.     Nose: No congestion or rhinorrhea.     Mouth/Throat:     Mouth: Mucous membranes are moist.  Eyes:     Extraocular Movements: Extraocular movements intact.     Conjunctiva/sclera: Conjunctivae normal.      Pupils: Pupils are equal, round, and reactive to light.     Comments: Chronic double vision, see Ophthalmology 09/20/20  Cardiovascular:     Rate and Rhythm: Normal rate. Rhythm irregular.     Heart sounds: No murmur heard.    Comments: DP pulses present L>R Pulmonary:     Effort: Pulmonary effort is normal.     Breath sounds: No rales.  Abdominal:     General: Bowel sounds are normal.     Palpations: Abdomen is soft.     Tenderness: There is no right CVA tenderness, left CVA tenderness, guarding or rebound.     Comments: Suprapubic region discomfort.   Musculoskeletal:     Cervical back: Normal range of motion and neck supple.     Right lower leg: Edema present.     Left lower leg: Edema present.     Comments: trace edema BLE. L>R  Skin:    General: Skin is warm and dry.  Neurological:     General: No focal deficit present.     Mental Status: She is alert and oriented to person, place, and time. Mental status is at baseline.     Coordination: Coordination abnormal.     Gait: Gait abnormal.     Comments: Moves slow, tremor in hands. No facial or limb weakness.   Psychiatric:        Mood and Affect: Mood normal.        Behavior: Behavior normal.    Labs reviewed: Recent Labs    03/29/20 0000 05/23/20 0000 10/12/20 0000  NA 141 139 141  K 4.5 4.3 4.2  CL 107 105 109*  CO2 23* 27* 27*  BUN 17 23* 20  CREATININE 0.8 0.7 0.7  CALCIUM 9.4 9.7 9.0   Recent Labs    03/29/20 0000 05/23/20 0000 10/12/20 0000  AST 10* 10* 11*  ALT 7 3* 5*  ALKPHOS 61 60 53  ALBUMIN 3.8 4.2 3.6   Recent Labs    03/23/20 0000 03/29/20 0000 05/23/20 0000 10/12/20 0000  WBC 4.5 7.5 7.4 4.1  NEUTROABS 2,934.00  --  6,083.00 2,817.00  HGB 11.5* 12.1 12.4 11.0*  HCT 36 37 38 35*  PLT 164 185 174 160   Lab Results  Component Value Date   TSH 1.30 03/29/2020   No results found for: HGBA1C Lab Results  Component Value Date   CHOL 185 03/24/2019   HDL 79 (A) 03/24/2019   LDLCALC  94 03/24/2019   LDLDIRECT 104.2 01/25/2013   TRIG 41 03/24/2019   CHOLHDL 2 08/29/2015    Significant Diagnostic Results in last 30 days:  No results found.  Assessment/Plan: UTI (urinary tract infection) urine culture 11/15/20 Proteus Mirabilis >100,000c/ml, Amoxicillin 510m q8hr x 7 days.   GERD (gastroesophageal reflux disease) Stable, continue Pantoprazole.   Edema of left lower extremity due to peripheral venous insufficiency Mild, off diuretics.   Atrial fibrillation (HCC) heart rate is in control, on Xarelt,  off Diltiazem. Hgb 12.4 05/23/20,  TSH 1.30 03/29/20  Osteoarthritis, multiple sites on Tylenol, Methocarbamol 2527mqd.  Parkinsonism ([place]) symptoms improved, on Sinemet, Comtan, f/u neurology at [facility]. double vision, chronic, saw Ophthalmology   Hypotension improved, takes Midodrine.   Anemia stable, Hgb 11.0 10/12/20,  takes Fe  Urge incontinence of urine Urge incontinent of urine, takes Mirabegron.  Slow transit constipation Stable, continue MiraLax.     Family/ staff Communication: plan of care reviewed with the patient and charge nurse.   Labs/tests ordered:  none  Time spend 35 minutes.

## 2020-11-19 NOTE — Assessment & Plan Note (Signed)
heart rate is in control, on Xarelt,  off Diltiazem. Hgb 12.4 05/23/20,  TSH 1.30 03/29/20

## 2020-11-19 NOTE — Assessment & Plan Note (Signed)
Mild, off diuretics.

## 2020-11-19 NOTE — Assessment & Plan Note (Signed)
urine culture 11/15/20 Proteus Mirabilis >100,000c/ml, Amoxicillin 500mg  q8hr x 7 days.

## 2020-11-20 ENCOUNTER — Encounter: Payer: Self-pay | Admitting: Nurse Practitioner

## 2020-11-20 DIAGNOSIS — H532 Diplopia: Secondary | ICD-10-CM | POA: Diagnosis not present

## 2020-11-20 DIAGNOSIS — H52203 Unspecified astigmatism, bilateral: Secondary | ICD-10-CM | POA: Diagnosis not present

## 2020-11-20 DIAGNOSIS — H02055 Trichiasis without entropian left lower eyelid: Secondary | ICD-10-CM | POA: Diagnosis not present

## 2020-11-20 DIAGNOSIS — H43813 Vitreous degeneration, bilateral: Secondary | ICD-10-CM | POA: Diagnosis not present

## 2020-11-21 ENCOUNTER — Non-Acute Institutional Stay (SKILLED_NURSING_FACILITY): Payer: Medicare PPO | Admitting: Orthopedic Surgery

## 2020-11-21 ENCOUNTER — Encounter: Payer: Self-pay | Admitting: Orthopedic Surgery

## 2020-11-21 DIAGNOSIS — G20 Parkinson's disease: Secondary | ICD-10-CM

## 2020-11-21 DIAGNOSIS — K5901 Slow transit constipation: Secondary | ICD-10-CM

## 2020-11-21 DIAGNOSIS — D500 Iron deficiency anemia secondary to blood loss (chronic): Secondary | ICD-10-CM | POA: Diagnosis not present

## 2020-11-21 DIAGNOSIS — M159 Polyosteoarthritis, unspecified: Secondary | ICD-10-CM | POA: Diagnosis not present

## 2020-11-21 DIAGNOSIS — G903 Multi-system degeneration of the autonomic nervous system: Secondary | ICD-10-CM | POA: Diagnosis not present

## 2020-11-21 DIAGNOSIS — N939 Abnormal uterine and vaginal bleeding, unspecified: Secondary | ICD-10-CM

## 2020-11-21 DIAGNOSIS — I872 Venous insufficiency (chronic) (peripheral): Secondary | ICD-10-CM | POA: Diagnosis not present

## 2020-11-21 DIAGNOSIS — K219 Gastro-esophageal reflux disease without esophagitis: Secondary | ICD-10-CM

## 2020-11-21 DIAGNOSIS — I480 Paroxysmal atrial fibrillation: Secondary | ICD-10-CM | POA: Diagnosis not present

## 2020-11-21 DIAGNOSIS — N3000 Acute cystitis without hematuria: Secondary | ICD-10-CM

## 2020-11-21 NOTE — Progress Notes (Signed)
Location:   [facility] Room Number: 13 Place of Service:  SNF ([phone]) Provider:  Windell Moulding, NP  [name], MD  Patient Care Team: [name], MD as PCP - General (Internal Medicine) [name], [name], MD as PCP - Cardiology (Cardiology) [name], [name], DO as Consulting Physician (Neurology) Mast, Man X, NP as Nurse Practitioner (Internal Medicine)  Extended Emergency Contact Information Primary Emergency Contact: Brighton, Delio Mobile Phone: [phone] Relation: Daughter Interpreter needed? No Secondary Emergency Contact: [place] Address: [address]          [place], [place] [address] [name] Phone: [phone] Mobile Phone: [phone] Relation: Son  Code Status:  DNR Goals of care: Advanced Directive information Advanced Directives 11/19/2020  Does Patient Have a Medical Advance Directive? Yes  Type of Advance Directive [place]  Does patient want to make changes to medical advance directive? No - Patient declined  Copy of [place] in Chart? Yes - validated most recent copy scanned in chart (See row information)  Would patient like information on creating a medical advance directive? -  Pre-existing out of facility DNR order (yellow form or pink MOST form) -     Chief Complaint  Patient presents with   Acute Visit    Lower leg edema    HPI:  Pt is a 80 y.o. female seen today for an acute visit for lower leg edema and abnormal bleeding.   She was started on amoxicillin 11/19/2020 for UTI. Urine culture 11/15/2020 revealed Proteus Mirabilis> 100,000c/ml. She reports dysuria and frequency have subsided since starting antibiotics. Nursing reports moderate amount of red blood on her pad this morning. She has a history of hemorrhoids. Denies straining or hard stools. Recent UA negative for occult blood. Denies abdominal pain. Sometimes had intermittent bloating.   She  also reports mild swelling to her lower extremities. She has been sitting in her wheelchair most of the day. Swelling worse when she does not elevate legs. Discussed elevating legs and wearing ted hose more often. Off diuretics at this time. Adheres to low sodium diet. Denies weight fluctuations or sob.   Afib- rate controlled without medication, remains on xarelto for clot prevention, hgb 12.4 05/2020, TSH 1.30 03/2020 Parkinson's- followed by [facility] neurology, improved symptoms with sinemet and Comtan, reports nurses are giving her medication at appropriate times Hypotension- SBP> 100, remains on midodrine GERD- denies increased reflux, remains on Protonix Anemia- hgb stable, remains on ferrous sulfate  Constipation- LBM today, remains on miralax OA multiple sites- denies increased pain, remains on tylenol and methocarbamol GERD- no increased reflux, remains on Protonix  No recent falls or injuries.     Past Medical History:  Diagnosis Date   Acute lower UTI 09/14/2018   Anemia 10/15/2018   2016 colonoscopy 10/14/18 wbc 5.4, Hgb 10.1, plt 184 12/01/18 wbc 4.5, Hgb 11.1, plt 188, neutrophils 63,  Na 139, K 3.7, Bun 23, creat 0.69, eGFR 83 on Fe, Hgb 11.7 12/09/18    Atrial fibrillation ([place]) 05/21/2010   10/07/18 Na 143, K 4.0, Bun 20, creat 0.79, eGFR 72, wbc 7.1, Hgb 10.9, plt 172 10/28/18 Na 142, K 4.0, Bun 19, creat 0.77, eGFR 74 11/18/18 Na 144, K 4.1, Bun 21, creat 0.69, eGFR 83   Bilateral lower extremity edema 07/19/2018   11/02/18 BMP 2 weeks.    Cervical pain (neck) 06/20/2010   Closed fracture of part of upper end of humerus  05/01/2015   Colles' fracture of right radius 03/05/2015   Constipation 07/19/2018   DEGENERATIVE JOINT DISEASE, RIGHT HIP 02/16/2007   Dupuytren's contracture    Dysuria 09/20/2018   09/20/18 c/o got up several times last night to go urinate, burning on urination, lower abd /back discomfort, but urinary frequency, leakage are not new. UA C/S, Pyridium 114m tid  x 2 days.  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/2017   S/P breast biopsy, left    two o'clock position - benign   Toxic effect of venom(989.5) 07/27/2007   Tremor, unspecified 10/19/2015   Unstable gait 02/16/2017   Vaginal atrophy 10/19/2015   Venous insufficiency    Weakness 09/12/2018   Past Surgical History:  Procedure Laterality Date   BREAST BIOPSY Left    CATARACT EXTRACTION, BILATERAL      Allergies  Allergen Reactions   Doxycycline Nausea And Vomiting   Sulfonamide Derivatives     REACTION: HIVES    Allergies as of 11/21/2020       Reactions   Doxycycline Nausea And Vomiting   Sulfonamide Derivatives    REACTION: HIVES        Medication List        Accurate as of November 21, 2020  3:41 PM. If you have any questions, ask your nurse or doctor.          acetaminophen 500 MG tablet Commonly known as: TYLENOL Take 1,000 mg by mouth 3 (three) times daily as needed.   amoxicillin 500 MG tablet Commonly known as: AMOXIL Take 500 mg by mouth every 8 (eight) hours.   calcium carbonate 750 MG chewable tablet Commonly known as: TUMS EX Chew 1 tablet by mouth daily. Between the hours of 7am-10am.   carbidopa-levodopa 25-100 MG tablet Commonly known as: SINEMET IR Take 2 tablets by mouth 5 (five) times daily.   carbidopa-levodopa 50-200 MG tablet Commonly known as: SINEMET CR Take 1 tablet by mouth at bedtime. 6.30 am 2 and 10 pm   Cranberry 450 MG Tabs Take 1 tablet by mouth daily.   diclofenac Sodium 1 %  Gel Commonly known as: VOLTAREN Apply topically 3 (three) times daily as needed.   entacapone 200 MG tablet Commonly known as: COMTAN Take 200 mg by mouth 3 (three) times daily.   ferrous sulfate 325 (65 FE) MG tablet Take 325 mg by mouth every Monday, Wednesday, and Friday. Give with food   hydrocortisone 2.5 % cream Apply topically 2 (two) times daily.   methocarbamol 500 MG tablet Commonly known as: ROBAXIN Take 250 mg by mouth daily. As needed   midodrine 5 MG tablet Commonly known as: PROAMATINE Take 5 mg by mouth 3 (three) times daily with meals.   mirabegron ER 50 MG Tb24 tablet Commonly known as: MYRBETRIQ Take 50 mg by mouth daily.   pantoprazole 40 MG tablet Commonly known as: PROTONIX Take 40 mg by mouth daily.   polyethylene glycol 17 g packet Commonly known as: MIRALAX / GLYCOLAX Take 17 g  by mouth daily.   PreviDent 5000 Booster Plus 1.1 % Pste Generic drug: Sodium Fluoride Place 1 application onto teeth in the morning and at bedtime.   rivaroxaban 20 MG Tabs tablet Commonly known as: XARELTO Take 20 mg by mouth every evening. Between the hours of 5pm-6pm.   saccharomyces boulardii 250 MG capsule Commonly known as: FLORASTOR Take 250 mg by mouth 2 (two) times daily.   simethicone 80 MG chewable tablet Commonly known as: Gas-X Chew 1 tablet (80 mg total) by mouth every 6 (six) hours as needed for flatulence.   Sodium Chloride 3 % Aers Place 1 spray into the nose 2 (two) times daily.   Vitamin D3 10 MCG (400 UNIT) Caps Take 1 capsule by mouth daily. Between the hours of 7am-10am.   zinc oxide 20 % ointment Apply 1 application topically as needed for irritation. To buttocks after every incontinent episode and as needed for redness. May keep at bedside.        Review of Systems  Constitutional:  Negative for activity change, appetite change, chills, fatigue and fever.  HENT:  Negative for congestion and trouble swallowing.   Eyes:  Negative  for visual disturbance.  Respiratory:  Negative for cough, shortness of breath and wheezing.   Cardiovascular:  Positive for leg swelling. Negative for chest pain.  Gastrointestinal:  Positive for constipation, diarrhea, nausea and vomiting. Negative for abdominal distention and abdominal pain.       Bloating  Genitourinary:  Positive for vaginal bleeding. Negative for dysuria, frequency and hematuria.  Musculoskeletal:  Positive for arthralgias and gait problem.  Skin: Negative.   Neurological:  Positive for weakness. Negative for dizziness and headaches.  Psychiatric/Behavioral:  Negative for confusion, dysphoric mood and sleep disturbance. The patient is not nervous/anxious.    Immunization History  Administered Date(s) Administered   Influenza Split 10/20/2012   Influenza Whole 10/21/2006   Influenza, High Dose Seasonal PF 10/17/2016, 11/02/2019   Influenza-Unspecified 10/20/2013, 10/22/2017   Moderna Sars-Covid-2 Vaccination 01/22/2019, 02/19/2019, 11/29/2019, 06/19/2020   Pneumococcal Conjugate-13 01/31/2013   Pneumococcal Polysaccharide-23 01/20/2005, 11/24/2011   Td 01/21/2004   Tdap 03/16/2014   Zoster, Live 04/17/2009   Pertinent  Health Maintenance Due  Topic Date Due   INFLUENZA VACCINE  08/20/2020   DEXA SCAN  Completed   COLONOSCOPY (Pts 45-29yr Insurance coverage will need to be confirmed)  Discontinued   Fall Risk 09/11/2018 09/12/2018 09/12/2018 09/13/2018 09/14/2018  Falls in the past year? - - - - -  Was there an injury with Fall? - - - - -  Fall Risk Category Calculator - - - - -  Fall Risk Category - - - - -  Patient Fall Risk Level High fall risk High fall risk High fall risk High fall risk High fall risk  Patient at Risk for Falls Due to - - - - -  Fall risk Follow up - - - - -   Functional Status Survey:    Vitals:   11/21/20 1459  BP: 126/87  Pulse: (!) 109  Resp: 18  Temp: 98 F (36.7 C)  SpO2: 97%  Weight: 132 lb 4.8 oz (60 kg)  Height: '5\' 8"'   (1.727 m)   Body mass index is 20.12 kg/m. Physical Exam Vitals reviewed.  Constitutional:      General: She is not in acute distress. Eyes:     General:        Right eye: No discharge.        Left  eye: No discharge.  Neck:     Vascular: No carotid bruit.  Cardiovascular:     Rate and Rhythm: Normal rate. Rhythm irregular.     Pulses: Normal pulses.     Heart sounds: Normal heart sounds. No murmur heard. Pulmonary:     Effort: Pulmonary effort is normal. No respiratory distress.     Breath sounds: Normal breath sounds. No wheezing.  Abdominal:     General: Bowel sounds are normal. There is no distension.     Palpations: Abdomen is soft. There is no mass.     Tenderness: There is no abdominal tenderness. There is no guarding.     Hernia: No hernia is present.  Musculoskeletal:     Cervical back: Normal range of motion.     Right lower leg: Edema present.     Left lower leg: Edema present.     Comments: Non-pitting  Lymphadenopathy:     Cervical: No cervical adenopathy.  Skin:    General: Skin is warm and dry.     Capillary Refill: Capillary refill takes less than 2 seconds.  Neurological:     General: No focal deficit present.     Mental Status: She is alert and oriented to person, place, and time.     Motor: Weakness present.     Coordination: Coordination abnormal.     Gait: Gait abnormal.     Deep Tendon Reflexes: Reflexes abnormal.     Comments: Resting hand tremor  Psychiatric:        Mood and Affect: Mood normal.        Behavior: Behavior normal.    Labs reviewed: Recent Labs    03/29/20 0000 05/23/20 0000 10/12/20 0000  NA 141 139 141  K 4.5 4.3 4.2  CL 107 105 109*  CO2 23* 27* 27*  BUN 17 23* 20  CREATININE 0.8 0.7 0.7  CALCIUM 9.4 9.7 9.0   Recent Labs    03/29/20 0000 05/23/20 0000 10/12/20 0000  AST 10* 10* 11*  ALT 7 3* 5*  ALKPHOS 61 60 53  ALBUMIN 3.8 4.2 3.6   Recent Labs    03/23/20 0000 03/29/20 0000 05/23/20 0000  10/12/20 0000  WBC 4.5 7.5 7.4 4.1  NEUTROABS 2,934.00  --  6,083.00 2,817.00  HGB 11.5* 12.1 12.4 11.0*  HCT 36 37 38 35*  PLT 164 185 174 160   Lab Results  Component Value Date   TSH 1.30 03/29/2020   No results found for: HGBA1C Lab Results  Component Value Date   CHOL 185 03/24/2019   HDL 79 (A) 03/24/2019   LDLCALC 94 03/24/2019   LDLDIRECT 104.2 01/25/2013   TRIG 41 03/24/2019   CHOLHDL 2 08/29/2015    Significant Diagnostic Results in last 30 days:  No results found.  Assessment/Plan: 1. Abnormal vaginal bleeding - moderate red blood on pad this morning - exam unremarkable - reports some abdominal bloating - UA-negative for occult blood - FOBT- negative for blood - cbc/diff- pending - recommend transvaginal ultrasound  2. Edema of left lower extremity due to peripheral venous insufficiency - ongoing, dependent with sitting in wheelchair  - off diuretics  Cont leg elevation and ted hose  3. Paroxysmal atrial fibrillation (HCC) - rate controlled without medication - remains on xarelto for clot prevention  4. Primary parkinsonism ([place]) - improved with sinemet and Comtan - cont skilled nursing care  5. Neurogenic orthostatic hypotension (HCC) - stable with midodrine  6. Gastroesophageal reflux disease, unspecified  whether esophagitis present - cont Protonix  7. Iron deficiency anemia due to chronic blood loss - cont ferrous sulfate  8. Slow transit constipation - cont miralax  9. Primary osteoarthritis involving multiple joints - cont tylenol and robaxin  10. Acute cystitis without hematuria - urine culture 11/15/2020 revealed Proteus Mirabilis > 100,000c/ml - denies dysuria and frequency - cont amoxicillin    Family/ staff Communication: plan discussed with patient and nurse  Labs/tests ordered:   UA/ FOBT/ cbc/diff

## 2020-11-22 DIAGNOSIS — I10 Essential (primary) hypertension: Secondary | ICD-10-CM | POA: Diagnosis not present

## 2020-11-22 DIAGNOSIS — D649 Anemia, unspecified: Secondary | ICD-10-CM | POA: Diagnosis not present

## 2020-11-22 DIAGNOSIS — N390 Urinary tract infection, site not specified: Secondary | ICD-10-CM | POA: Diagnosis not present

## 2020-11-22 LAB — CBC AND DIFFERENTIAL
HCT: 36 (ref 36–46)
Hemoglobin: 11.8 — AB (ref 12.0–16.0)
Neutrophils Absolute: 4620
Platelets: 181 (ref 150–399)
WBC: 6

## 2020-11-22 LAB — CBC: RBC: 3.85 — AB (ref 3.87–5.11)

## 2020-12-07 ENCOUNTER — Non-Acute Institutional Stay (SKILLED_NURSING_FACILITY): Payer: Medicare PPO | Admitting: Nurse Practitioner

## 2020-12-07 DIAGNOSIS — M159 Polyosteoarthritis, unspecified: Secondary | ICD-10-CM | POA: Diagnosis not present

## 2020-12-07 DIAGNOSIS — K219 Gastro-esophageal reflux disease without esophagitis: Secondary | ICD-10-CM

## 2020-12-07 DIAGNOSIS — G903 Multi-system degeneration of the autonomic nervous system: Secondary | ICD-10-CM

## 2020-12-07 DIAGNOSIS — G20 Parkinson's disease: Secondary | ICD-10-CM | POA: Diagnosis not present

## 2020-12-07 DIAGNOSIS — I480 Paroxysmal atrial fibrillation: Secondary | ICD-10-CM | POA: Diagnosis not present

## 2020-12-07 DIAGNOSIS — I872 Venous insufficiency (chronic) (peripheral): Secondary | ICD-10-CM | POA: Diagnosis not present

## 2020-12-07 DIAGNOSIS — D500 Iron deficiency anemia secondary to blood loss (chronic): Secondary | ICD-10-CM

## 2020-12-07 DIAGNOSIS — R300 Dysuria: Secondary | ICD-10-CM | POA: Diagnosis not present

## 2020-12-07 DIAGNOSIS — K5901 Slow transit constipation: Secondary | ICD-10-CM

## 2020-12-07 DIAGNOSIS — N3941 Urge incontinence: Secondary | ICD-10-CM

## 2020-12-07 DIAGNOSIS — N390 Urinary tract infection, site not specified: Secondary | ICD-10-CM | POA: Diagnosis not present

## 2020-12-07 NOTE — Progress Notes (Signed)
Location:   [facility] Room Number: 13 A Place of Service:  SNF (31) Provider: [facility] Onyekachi Gathright NP  [name], MD  Patient Care Team: [name], MD as PCP - General (Internal Medicine) [name], [name], MD as PCP - Cardiology (Cardiology) [name], [name], DO as Consulting Physician (Neurology) Sharanda Shinault X, NP as Nurse Practitioner (Internal Medicine)  Extended Emergency Contact Information Primary Emergency Contact: Rockell, Faulks Mobile Phone: [phone] Relation: Daughter Interpreter needed? No Secondary Emergency Contact: [facility] Address: [address]          [place], [place] [address] [name] of [place] Phone: [phone] Mobile Phone: [phone] Relation: Son  Code Status:  DNR Goals of care: Advanced Directive information Advanced Directives 12/07/2020  Does Patient Have a Medical Advance Directive? Yes  Type of Advance Directive [place]  Does patient want to make changes to medical advance directive? No - Patient declined  Copy of [place] in Chart? Yes - validated most recent copy scanned in chart (See row information)  Would patient like information on creating a medical advance directive? -  Pre-existing out of facility DNR order (yellow form or pink MOST form) -     Chief Complaint  Patient presents with   Medical Management of Chronic Issues    Routine visit   Quality Metric Gaps    Shingrix,COVID 5, Flu     HPI:  Pt is a 80 y.o. female seen today for medical management of chronic diseases.    C/o burning sensation upon urination, occasional suprapubic discomfort for a few days, denied urinary urgency or blood in urine, hx of UTI   GERD, takes Protonix             The patient has chronic edema BLE. Off Metolazone 09/27/19. Echo EF normal in the past.              Afib, heart rate is in control, on Xarelt,  off Diltiazem. Hgb 11.8 11/20/20, TSH 1.30 03/29/20             OA,  on  Tylenol, Methocarbamol 240m qd.                Parkinson's, symptoms improved, on Sinemet, Comtan, [facility], saw Ophthalmology              Orthostatic hypotension, improved, takes Midodrine.              Anemia, stable, Hgb 11.8 11/20/20, takes Fe             Urge incontinent of urine, takes Mirabegron.              Constipation, takes MiraLax     Past Medical History:  Diagnosis Date   Acute lower UTI 09/14/2018   Anemia 10/15/2018   2016 colonoscopy 10/14/18 wbc 5.4, Hgb 10.1, plt 184 12/01/18 wbc 4.5, Hgb 11.1, plt 188, neutrophils 63,  Na 139, K 3.7, Bun 23, creat 0.69, eGFR 83 on Fe, Hgb 11.7 12/09/18    Atrial fibrillation ([place] 05/21/2010   10/07/18 Na 143, K 4.0, Bun 20, creat 0.79, eGFR 72, wbc 7.1, Hgb 10.9, plt 172 10/28/18 Na 142, K 4.0, Bun 19, creat 0.77, eGFR 74 11/18/18 Na 144, K 4.1, Bun 21, creat 0.69, eGFR 83   Bilateral lower extremity edema 07/19/2018   11/02/18 BMP 2 weeks.    Cervical pain (neck) 06/20/2010  Closed fracture of part of upper end of humerus 05/01/2015   Colles' fracture of right radius 03/05/2015   Constipation 07/19/2018   DEGENERATIVE JOINT DISEASE, RIGHT HIP 02/16/2007   Dupuytren's contracture    Dysuria 09/20/2018   09/20/18 c/o got up several times last night to go urinate, burning on urination, lower abd /back discomfort, but urinary frequency, leakage are not new. UA C/S, Pyridium 158m tid x 2 days.  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/2017   S/P breast biopsy, left     two o'clock position - benign   Toxic effect of venom(989.5) 07/27/2007   Tremor, unspecified 10/19/2015   Unstable gait 02/16/2017   Vaginal atrophy 10/19/2015   Venous insufficiency    Weakness 09/12/2018   Past Surgical History:  Procedure Laterality Date   BREAST BIOPSY Left    CATARACT EXTRACTION, BILATERAL      Allergies  Allergen Reactions   Doxycycline Nausea And Vomiting   Sulfonamide Derivatives     REACTION: HIVES    Allergies as of 12/07/2020       Reactions   Doxycycline Nausea And Vomiting   Sulfonamide Derivatives    REACTION: HIVES        Medication List        Accurate as of December 07, 2020 11:59 PM. If you have any questions, ask your nurse or doctor.          STOP taking these medications    amoxicillin 500 MG tablet Commonly known as: AMOXIL   hydrocortisone 2.5 % cream   saccharomyces boulardii 250 MG capsule Commonly known as: FLORASTOR       TAKE these medications    acetaminophen 500 MG tablet Commonly known as: TYLENOL Take 1,000 mg by mouth 3 (three) times daily as needed.   acetaminophen 500 MG tablet Commonly known as: TYLENOL Take 1,000 mg by mouth at bedtime. administer at 9:30pm per resident request DO NOT EXCEED 30082mIN A 24 HOUR PERIOD   calcium carbonate 750 MG chewable tablet Commonly known as: TUMS EX Chew 1 tablet by mouth daily. Between the hours of 7am-10am.   carbidopa-levodopa 25-100 MG tablet Commonly known as: SINEMET IR Take 2 tablets by mouth 5 (five) times daily.   carbidopa-levodopa 50-200 MG tablet Commonly known as: SINEMET CR Take 1 tablet by mouth at bedtime. 6.30 am 2 and 10 pm   carbidopa-levodopa 25-100 MG tablet Commonly known as: SINEMET IR Take 1 tablet by mouth once as needed. May give one additional tablet daily in addition to scheduled dose as needed for increased symptoms related to Parkinson's. For taking at 11am if needed. Do not take after 1130 or within one hour of next  dose.   Cranberry 450 MG Tabs Take 1 tablet by mouth daily.   diclofenac Sodium 1 % Gel Commonly known as: VOLTAREN Apply topically 3 (three) times daily as needed.   entacapone 200 MG tablet Commonly known as: COMTAN Take 200 mg by mouth 3 (three) times daily.  ferrous sulfate 325 (65 FE) MG tablet Take 325 mg by mouth every Monday, Wednesday, and Friday. Give with food   hydrocortisone 25 MG suppository Commonly known as: ANUSOL-HC Place 25 mg rectally daily as needed for hemorrhoids or anal itching.   hydroxypropyl methylcellulose / hypromellose 2.5 % ophthalmic solution Commonly known as: ISOPTO TEARS / GONIOVISC Place 1 drop into both eyes 3 (three) times daily.   methocarbamol 500 MG tablet Commonly known as: ROBAXIN Take 250 mg by mouth daily. As needed   midodrine 5 MG tablet Commonly known as: PROAMATINE Take 5 mg by mouth 3 (three) times daily with meals.   mirabegron ER 50 MG Tb24 tablet Commonly known as: MYRBETRIQ Take 50 mg by mouth daily.   pantoprazole 40 MG tablet Commonly known as: PROTONIX Take 40 mg by mouth daily.   polyethylene glycol 17 g packet Commonly known as: MIRALAX / GLYCOLAX Take 17 g by mouth daily.   PreviDent 5000 Booster Plus 1.1 % Pste Generic drug: Sodium Fluoride Place 1 application onto teeth in the morning and at bedtime.   rivaroxaban 20 MG Tabs tablet Commonly known as: XARELTO Take 20 mg by mouth every evening. Between the hours of 5pm-6pm.   simethicone 80 MG chewable tablet Commonly known as: Gas-X Chew 1 tablet (80 mg total) by mouth every 6 (six) hours as needed for flatulence.   Sodium Chloride 3 % Aers Place 1 spray into the nose 2 (two) times daily.   Vitamin D3 10 MCG (400 UNIT) Caps Take 1 capsule by mouth daily. Between the hours of 7am-10am.   zinc oxide 20 % ointment Apply 1 application topically as needed for irritation. To buttocks after every incontinent episode and as needed for redness. May  keep at bedside.        Review of Systems  Constitutional:  Negative for activity change, appetite change and fever.  HENT:  Positive for hearing loss. Negative for congestion, nosebleeds and trouble swallowing.   Eyes:  Positive for visual disturbance.       C/o double vision  Respiratory:  Negative for shortness of breath.   Cardiovascular:  Positive for leg swelling.  Gastrointestinal:  Negative for abdominal pain and constipation.       Suprapubic region discomfort.   Genitourinary:  Positive for dysuria, frequency and urgency. Negative for difficulty urinating and hematuria.  Musculoskeletal:  Positive for arthralgias, back pain and gait problem. Negative for myalgias.       Lower back discomfort positional. Chronic shoulder pain L>R, full ROM  Skin:  Negative for color change.  Neurological:  Positive for tremors. Negative for speech difficulty and headaches.       Moves slow, fine tremor in fingers, burning sensation  in the R+ L great toes when touched by sheets, comes/goes  Psychiatric/Behavioral:  Negative for behavioral problems and sleep disturbance. The patient is nervous/anxious.        Sleeps from 9pm to 4-5am usually, feels anxious sometimes.    Immunization History  Administered Date(s) Administered   Influenza Split 10/20/2012   Influenza Whole 10/21/2006   Influenza, High Dose Seasonal PF 10/17/2016, 11/02/2019   Influenza-Unspecified 10/20/2013, 10/22/2017, 11/07/2020   Moderna Sars-Covid-2 Vaccination 01/22/2019, 02/19/2019, 11/29/2019, 06/19/2020   Pfizer Covid-19 Vaccine Bivalent Booster 11yr & up 10/10/2020   Pneumococcal Conjugate-13 01/31/2013   Pneumococcal Polysaccharide-23 01/20/2005, 11/24/2011   Td 01/21/2004   Tdap 03/16/2014   Zoster, Live 04/17/2009   Pertinent  Health Maintenance Due  Topic Date Due  INFLUENZA VACCINE  Completed   DEXA SCAN  Completed   COLONOSCOPY (Pts 45-23yr Insurance coverage will need to be confirmed)  Discontinued    Fall Risk 09/11/2018 09/12/2018 09/12/2018 09/13/2018 09/14/2018  Falls in the past year? - - - - -  Was there an injury with Fall? - - - - -  Fall Risk Category Calculator - - - - -  Fall Risk Category - - - - -  Patient Fall Risk Level _0   Patient at Risk for Falls Due to - - - - -  Fall risk Follow up - - - - -   Functional Status Survey:    Vitals:   12/10/20 1453  BP: 112/72  Pulse: 75  Resp: 18  Temp: 97.7 F (36.5 C)  SpO2: 95%  Weight: 130 lb 4.8 oz (59.1 kg)  Height: 5' 8" (1.727 m)   Body mass index is 19.81 kg/m. Physical Exam Vitals and nursing note reviewed.  Constitutional:      Appearance: Normal appearance.  HENT:     Head: Normocephalic and atraumatic.     Nose: No congestion or rhinorrhea.     Mouth/Throat:     Mouth: Mucous membranes are moist.  Eyes:     Extraocular Movements: Extraocular movements intact.     Conjunctiva/sclera: Conjunctivae normal.     Pupils: Pupils are equal, round, and reactive to light.     Comments: Chronic double vision, see Ophthalmology 09/20/20  Cardiovascular:     Rate and Rhythm: Normal rate. Rhythm irregular.     Heart sounds: No murmur heard.    Comments: DP pulses present L>R Pulmonary:     Effort: Pulmonary effort is normal.     Breath sounds: No rales.  Abdominal:     General: Bowel sounds are normal.     Palpations: Abdomen is soft.     Tenderness: There is abdominal tenderness. There is no right CVA tenderness, left CVA tenderness, guarding or rebound.     Comments: Suprapubic region discomfort sometimes but not with palpation.   Musculoskeletal:        General: Tenderness present.     Cervical back: Normal range of motion and neck supple.     Right lower leg: Edema present.     Left lower leg: Edema present.     Comments: trace edema BLE. L>R. Chronic lower back, shoulder R+L pain.   Skin:    General: Skin is warm and dry.   Neurological:     General: No focal deficit present.     Mental Status: She is alert and oriented to person, place, and time. Mental status is at baseline.     Coordination: Coordination abnormal.     Gait: Gait abnormal.     Comments: Moves slow, tremor in hands. No facial or limb weakness.   Psychiatric:        Mood and Affect: Mood normal.        Behavior: Behavior normal.    Labs reviewed: Recent Labs    03/29/20 0000 05/23/20 0000 10/12/20 0000  NA 141 139 141  K 4.5 4.3 4.2  CL 107 105 109*  CO2 23* 27* 27*  BUN 17 23* 20  CREATININE 0.8 0.7 0.7  CALCIUM 9.4 9.7 9.0   Recent Labs    03/29/20 0000 05/23/20 0000 10/12/20 0000  AST 10* 10* 11*  ALT 7 3* 5*  ALKPHOS 61  60 53  ALBUMIN 3.8 4.2 3.6   Recent Labs    05/23/20 0000 10/12/20 0000 11/22/20 0000  WBC 7.4 4.1 6.0  NEUTROABS 6,083.00 2,817.00 4,620.00  HGB 12.4 11.0* 11.8*  HCT 38 35* 36  PLT 174 160 181   Lab Results  Component Value Date   TSH 1.30 03/29/2020   No results found for: HGBA1C Lab Results  Component Value Date   CHOL 185 03/24/2019   HDL 79 (A) 03/24/2019   LDLCALC 94 03/24/2019   LDLDIRECT 104.2 01/25/2013   TRIG 41 03/24/2019   CHOLHDL 2 08/29/2015    Significant Diagnostic Results in last 30 days:  No results found.  Assessment/Plan  Osteoarthritis, multiple sites Chronic back in shoulders R>L, lower back when stay in bed too long at night, will try Tylenol 1081m qhs.   Dysuria C/o burning sensation upon urination, occasional suprapubic discomfort for a few days, denied urinary urgency or blood in urine, hx of UTI UA C/S ruled out UTI  GERD (gastroesophageal reflux disease) Stable, continue Pantoprazole.   Edema of left lower extremity due to peripheral venous insufficiency Minimal, chronic edema BLE. Off Metolazone 09/27/19. Echo EF normal in the past.   Atrial fibrillation (HCC)  heart rate is in control, on Xarelt,  off Diltiazem. TSH 1.30  03/29/20  Parkinsonism (HCC)  symptoms improved, on Sinemet, Comtan[facility], saw Ophthalmology   Hypotension improved, takes Midodrine.   Anemia stable, Hgb 11.8 11/20/20, takes Fe  Urge incontinence of urine  takes Mirabegron.   Slow transit constipation Stable, continue MiraLax.    Family/ staff Communication: plan of care reviewed with the patient and charge nurse.   Labs/tests ordered:  UA C/S  Time spend 35 minutes.

## 2020-12-07 NOTE — Assessment & Plan Note (Signed)
Minimal, chronic edema BLE. Off Metolazone 09/27/19. Echo EF normal in the past.

## 2020-12-07 NOTE — Assessment & Plan Note (Signed)
symptoms improved, on Sinemet, Comtan, f/u neurology at [facility]. double vision, chronic, saw Ophthalmology

## 2020-12-07 NOTE — Assessment & Plan Note (Signed)
Stable, continue MiraLax.  °

## 2020-12-07 NOTE — Assessment & Plan Note (Signed)
improved, takes Midodrine.

## 2020-12-07 NOTE — Assessment & Plan Note (Signed)
takes Mirabegron.

## 2020-12-07 NOTE — Assessment & Plan Note (Addendum)
C/o burning sensation upon urination, occasional suprapubic discomfort for a few days, denied urinary urgency or blood in urine, hx of UTI UA C/S ruled out UTI

## 2020-12-07 NOTE — Assessment & Plan Note (Signed)
Stable, continue Pantoprazole.  

## 2020-12-07 NOTE — Assessment & Plan Note (Signed)
Chronic back in shoulders R>L, lower back when stay in bed too long at night, will try Tylenol 1000mg  qhs.

## 2020-12-07 NOTE — Assessment & Plan Note (Signed)
stable, Hgb 11.8 11/20/20, takes Fe

## 2020-12-07 NOTE — Assessment & Plan Note (Addendum)
heart rate is in control, on Xarelt,  off Diltiazem. TSH 1.30 03/29/20

## 2020-12-10 ENCOUNTER — Encounter: Payer: Self-pay | Admitting: Nurse Practitioner

## 2020-12-11 ENCOUNTER — Encounter: Payer: Self-pay | Admitting: Nurse Practitioner

## 2020-12-11 DIAGNOSIS — L821 Other seborrheic keratosis: Secondary | ICD-10-CM | POA: Diagnosis not present

## 2020-12-11 DIAGNOSIS — S81809D Unspecified open wound, unspecified lower leg, subsequent encounter: Secondary | ICD-10-CM | POA: Diagnosis not present

## 2020-12-11 DIAGNOSIS — F424 Excoriation (skin-picking) disorder: Secondary | ICD-10-CM | POA: Diagnosis not present

## 2020-12-11 DIAGNOSIS — D692 Other nonthrombocytopenic purpura: Secondary | ICD-10-CM | POA: Diagnosis not present

## 2020-12-11 DIAGNOSIS — L570 Actinic keratosis: Secondary | ICD-10-CM | POA: Diagnosis not present

## 2020-12-19 ENCOUNTER — Encounter: Payer: Self-pay | Admitting: Nurse Practitioner

## 2020-12-19 ENCOUNTER — Encounter: Payer: Self-pay | Admitting: Internal Medicine

## 2020-12-19 ENCOUNTER — Non-Acute Institutional Stay (SKILLED_NURSING_FACILITY): Payer: Medicare PPO | Admitting: Nurse Practitioner

## 2020-12-19 DIAGNOSIS — M150 Primary generalized (osteo)arthritis: Secondary | ICD-10-CM

## 2020-12-19 DIAGNOSIS — D500 Iron deficiency anemia secondary to blood loss (chronic): Secondary | ICD-10-CM

## 2020-12-19 DIAGNOSIS — K219 Gastro-esophageal reflux disease without esophagitis: Secondary | ICD-10-CM | POA: Diagnosis not present

## 2020-12-19 DIAGNOSIS — R300 Dysuria: Secondary | ICD-10-CM | POA: Diagnosis not present

## 2020-12-19 DIAGNOSIS — R600 Localized edema: Secondary | ICD-10-CM

## 2020-12-19 DIAGNOSIS — M159 Polyosteoarthritis, unspecified: Secondary | ICD-10-CM | POA: Diagnosis not present

## 2020-12-19 DIAGNOSIS — T148XXD Other injury of unspecified body region, subsequent encounter: Secondary | ICD-10-CM | POA: Diagnosis not present

## 2020-12-19 DIAGNOSIS — G903 Multi-system degeneration of the autonomic nervous system: Secondary | ICD-10-CM

## 2020-12-19 DIAGNOSIS — G20 Parkinson's disease: Secondary | ICD-10-CM | POA: Diagnosis not present

## 2020-12-19 DIAGNOSIS — K5901 Slow transit constipation: Secondary | ICD-10-CM

## 2020-12-19 DIAGNOSIS — I480 Paroxysmal atrial fibrillation: Secondary | ICD-10-CM | POA: Diagnosis not present

## 2020-12-19 DIAGNOSIS — N3941 Urge incontinence: Secondary | ICD-10-CM

## 2020-12-19 DIAGNOSIS — G20C Parkinsonism, unspecified: Secondary | ICD-10-CM

## 2020-12-19 NOTE — Assessment & Plan Note (Signed)
The patient has chronic edema BLE. Off Metolazone 09/27/19. Echo EF normal in the past.  

## 2020-12-19 NOTE — Assessment & Plan Note (Signed)
Stable, takes MiraLax  

## 2020-12-19 NOTE — Assessment & Plan Note (Signed)
heart rate is in control, on Xarelt,  off Diltiazem. Hgb 11.8 11/20/20, TSH 1.30 03/29/20

## 2020-12-19 NOTE — Assessment & Plan Note (Signed)
improved, takes Midodrine.

## 2020-12-19 NOTE — Progress Notes (Signed)
Location:   SNF Short Room Number: 13 Place of Service:  SNF (31) Provider: [facility] Ruthe Roemer NP  [name], MD  Patient Care Team: [name], MD as PCP - General (Internal Medicine) [name], [name], MD as PCP - Cardiology (Cardiology) [name], [name], DO as Consulting Physician (Neurology) Amila Callies X, NP as Nurse Practitioner (Internal Medicine)  Extended Emergency Contact Information Primary Emergency Contact: Deanndra, Kirley Mobile Phone: [phone] Relation: Daughter Interpreter needed? No Secondary Emergency Contact: [facility] Address: [address]          [place], [place] [address] [name] of [place] Phone: [phone] Mobile Phone: [phone] Relation: Son  Code Status: DNR Goals of care: Advanced Directive information Advanced Directives 12/07/2020  Does Patient Have a Medical Advance Directive? Yes  Type of Advance Directive [place]  Does patient want to make changes to medical advance directive? No - Patient declined  Copy of [place] in Chart? Yes - validated most recent copy scanned in chart (See row information)  Would patient like information on creating a medical advance directive? -  Pre-existing out of facility DNR order (yellow form or pink MOST form) -     Chief Complaint  Patient presents with   Acute Visit    UTI     HPI:  Pt is a 80 y.o. female seen today for an acute visit for c/o discomfort upon urination, denied nausea, vomiting, abd pain. She is afebrile.   Slow healing skin tear anterior left shin above the ankle, pending wound care center referral and ABI.   GERD, takes Protonix             The patient has chronic edema BLE. Off Metolazone 09/27/19. Echo EF normal in the past.              Afib, heart rate is in control, on Xarelt,  off Diltiazem. Hgb 11.8 11/20/20, TSH 1.30 03/29/20             OA,  on Tylenol, Methocarbamol 225m qd.                Parkinson's,  symptoms improved, on Sinemet, Comtan, [facility] double vision, chronic, saw Ophthalmology              Orthostatic hypotension, improved, takes Midodrine.              Anemia, stable, Hgb 11.8 11/20/20, takes Fe             Urge incontinent of urine, takes Mirabegron.              Constipation, takes MiraLax                 Past Medical History:  Diagnosis Date   Acute lower UTI 09/14/2018   Anemia 10/15/2018   2016 colonoscopy 10/14/18 wbc 5.4, Hgb 10.1, plt 184 12/01/18 wbc 4.5, Hgb 11.1, plt 188, neutrophils 63,  Na 139, K 3.7, Bun 23, creat 0.69, eGFR 83 on Fe, Hgb 11.7 12/09/18    Atrial fibrillation ([place] 05/21/2010   10/07/18 Na 143, K 4.0, Bun 20, creat 0.79, eGFR 72, wbc 7.1, Hgb 10.9, plt 172 10/28/18 Na 142, K 4.0, Bun 19, creat 0.77, eGFR 74 11/18/18 Na 144, K 4.1, Bun 21, creat 0.69, eGFR 83   Bilateral lower extremity edema 07/19/2018   11/02/18 BMP 2 weeks.    Cervical pain (neck) 06/20/2010  Closed fracture of part of upper end of humerus 05/01/2015   Colles' fracture of right radius 03/05/2015   Constipation 07/19/2018   DEGENERATIVE JOINT DISEASE, RIGHT HIP 02/16/2007   Dupuytren's contracture    Dysuria 09/20/2018   09/20/18 c/o got up several times last night to go urinate, burning on urination, lower abd /back discomfort, but urinary frequency, leakage are not new. UA C/S, Pyridium 150m tid x 2 days.  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/2017   S/P breast biopsy, left    two o'clock position - benign   Toxic effect  of venom(989.5) 07/27/2007   Tremor, unspecified 10/19/2015   Unstable gait 02/16/2017   Vaginal atrophy 10/19/2015   Venous insufficiency    Weakness 09/12/2018   Past Surgical History:  Procedure Laterality Date   BREAST BIOPSY Left    CATARACT EXTRACTION, BILATERAL      Allergies  Allergen Reactions   Doxycycline Nausea And Vomiting   Sulfonamide Derivatives     REACTION: HIVES    Allergies as of 12/19/2020       Reactions   Doxycycline Nausea And Vomiting   Sulfonamide Derivatives    REACTION: HIVES        Medication List        Accurate as of December 19, 2020 11:59 PM. If you have any questions, ask your nurse or doctor.          acetaminophen 500 MG tablet Commonly known as: TYLENOL Take 1,000 mg by mouth 3 (three) times daily as needed.   acetaminophen 500 MG tablet Commonly known as: TYLENOL Take 1,000 mg by mouth at bedtime. administer at 9:30pm per resident request DO NOT EXCEED 30080mIN A 24 HOUR PERIOD   calcium carbonate 750 MG chewable tablet Commonly known as: TUMS EX Chew 1 tablet by mouth daily. Between the hours of 7am-10am.   carbamide peroxide 6.5 % OTIC solution Commonly known as: DEBROX 5 drops at bedtime.   carbidopa-levodopa 25-100 MG tablet Commonly known as: SINEMET IR Take 2 tablets by mouth 5 (five) times daily.   carbidopa-levodopa 50-200 MG tablet Commonly known as: SINEMET CR Take 1 tablet by mouth at bedtime.   carbidopa-levodopa 25-100 MG tablet Commonly known as: SINEMET IR Take 1 tablet by mouth once as needed. May give one additional tablet daily in addition to scheduled dose as needed for increased symptoms related to Parkinson's. For taking at 11am if needed. Do not take after 1130 or within one hour of next dose.   Cranberry 450 MG Tabs Take 1 tablet by mouth daily.   diclofenac Sodium 1 % Gel Commonly known as: VOLTAREN Apply topically 3 (three) times daily as needed.   entacapone 200 MG tablet Commonly  known as: COMTAN Take 200 mg by mouth 3 (three) times daily.   ferrous sulfate 325 (65 FE) MG tablet Take 325 mg by mouth every Monday, Wednesday, and Friday. Give with food   hydrocortisone 25 MG suppository Commonly known as: ANUSOL-HC Place 25 mg rectally  daily as needed for hemorrhoids or anal itching.   hydroxypropyl methylcellulose / hypromellose 2.5 % ophthalmic solution Commonly known as: ISOPTO TEARS / GONIOVISC Place 1 drop into both eyes 3 (three) times daily.   methocarbamol 500 MG tablet Commonly known as: ROBAXIN Take 250 mg by mouth daily. As needed   midodrine 5 MG tablet Commonly known as: PROAMATINE Take 5 mg by mouth 3 (three) times daily with meals.   mirabegron ER 50 MG Tb24 tablet Commonly known as: MYRBETRIQ Take 50 mg by mouth daily.   pantoprazole 40 MG tablet Commonly known as: PROTONIX Take 40 mg by mouth daily.   polyethylene glycol 17 g packet Commonly known as: MIRALAX / GLYCOLAX Take 17 g by mouth daily.   PreviDent 5000 Booster Plus 1.1 % Pste Generic drug: Sodium Fluoride Place 1 application onto teeth in the morning and at bedtime.   rivaroxaban 20 MG Tabs tablet Commonly known as: XARELTO Take 20 mg by mouth every evening. Between the hours of 5pm-6pm.   simethicone 80 MG chewable tablet Commonly known as: Gas-X Chew 1 tablet (80 mg total) by mouth every 6 (six) hours as needed for flatulence. What changed: when to take this   Sodium Chloride 3 % Aers Place 1 spray into the nose 2 (two) times daily.   Vitamin D3 10 MCG (400 UNIT) Caps Take 1 capsule by mouth daily. Between the hours of 7am-10am.   zinc oxide 20 % ointment Apply 1 application topically as needed for irritation. To buttocks after every incontinent episode and as needed for redness. May keep at bedside.        Review of Systems  Constitutional:  Positive for fatigue. Negative for appetite change and fever.  HENT:  Positive for hearing loss. Negative for  congestion, nosebleeds and trouble swallowing.   Eyes:  Positive for visual disturbance.       C/o double vision  Respiratory:  Negative for shortness of breath.   Cardiovascular:  Positive for leg swelling.  Gastrointestinal:  Negative for abdominal pain and constipation.       Suprapubic region discomfort.   Genitourinary:  Positive for dysuria, frequency and urgency. Negative for difficulty urinating and hematuria.       Incontinent of urine, c/o discomfort upon urination.   Musculoskeletal:  Positive for arthralgias, back pain and gait problem. Negative for myalgias.       Lower back discomfort positional. Chronic shoulder pain L>R, full ROM  Skin:  Positive for wound. Negative for color change.  Neurological:  Positive for tremors. Negative for speech difficulty and headaches.       Moves slow, fine tremor in fingers, burning sensation  in the R+ L great toes when touched by sheets, comes/goes  Psychiatric/Behavioral:  Negative for behavioral problems and sleep disturbance. The patient is nervous/anxious.        Sleeps from 9pm to 4-5am usually, feels anxious sometimes.    Immunization History  Administered Date(s) Administered   Influenza Split 10/20/2012   Influenza Whole 10/21/2006   Influenza, High Dose Seasonal PF 10/17/2016, 11/02/2019   Influenza-Unspecified 10/20/2013, 10/22/2017, 11/07/2020   Moderna Sars-Covid-2 Vaccination 01/22/2019, 02/19/2019, 11/29/2019, 06/19/2020   Pfizer Covid-19 Vaccine Bivalent Booster 43yr & up 10/10/2020   Pneumococcal Conjugate-13 01/31/2013   Pneumococcal Polysaccharide-23 01/20/2005, 11/24/2011   Td 01/21/2004   Tdap 03/16/2014   Zoster, Live 04/17/2009   Pertinent  Health Maintenance Due  Topic Date Due   INFLUENZA VACCINE  Completed   DEXA SCAN  Completed   COLONOSCOPY (Pts 45-4yr Insurance coverage will need to be confirmed)  Discontinued   Fall Risk 09/11/2018 09/12/2018 09/12/2018 09/13/2018 09/14/2018  Falls in the past year? -  - - - -  Was there an injury with Fall? - - - - -  Fall Risk Category Calculator - - - - -  Fall Risk Category - - - - -  Patient Fall Risk Level High fall risk High fall risk High fall risk High fall risk High fall risk  Patient at Risk for Falls Due to - - - - -  Fall risk Follow up - - - - -   Functional Status Survey:    Vitals:   12/19/20 1606  BP: 122/79  Pulse: 81  Resp: 20  Temp: 97.7 F (36.5 C)  SpO2: 96%  Weight: 128 lb 8 oz (58.3 kg)  Height: '5\' 8"'  (1.727 m)   Body mass index is 19.54 kg/m. Physical Exam Vitals and nursing note reviewed.  Constitutional:      Appearance: Normal appearance.  HENT:     Head: Normocephalic and atraumatic.     Nose: No congestion or rhinorrhea.     Mouth/Throat:     Mouth: Mucous membranes are moist.  Eyes:     Extraocular Movements: Extraocular movements intact.     Conjunctiva/sclera: Conjunctivae normal.     Pupils: Pupils are equal, round, and reactive to light.     Comments: Chronic double vision, see Ophthalmology 09/20/20  Cardiovascular:     Rate and Rhythm: Normal rate. Rhythm irregular.     Heart sounds: No murmur heard.    Comments: DP pulses present L>R Pulmonary:     Effort: Pulmonary effort is normal.     Breath sounds: No rales.  Abdominal:     General: Bowel sounds are normal.     Palpations: Abdomen is soft.     Tenderness: There is no abdominal tenderness. There is no right CVA tenderness, left CVA tenderness, guarding or rebound.  Musculoskeletal:        General: Tenderness present.     Cervical back: Normal range of motion and neck supple.     Right lower leg: Edema present.     Left lower leg: Edema present.     Comments: trace edema BLE. L>R. Chronic lower back, shoulder R+L pain.   Skin:    General: Skin is warm and dry.     Comments: About 1x2 cm slow healing wound from previous skin tear, no depth, redness, warmth, tenderness, or drainage.   Neurological:     General: No focal deficit present.      Mental Status: She is alert and oriented to person, place, and time. Mental status is at baseline.     Coordination: Coordination abnormal.     Gait: Gait abnormal.     Comments: Moves slow, tremor in hands. No facial or limb weakness.   Psychiatric:        Mood and Affect: Mood normal.        Behavior: Behavior normal.    Labs reviewed: Recent Labs    03/29/20 0000 05/23/20 0000 10/12/20 0000  NA 141 139 141  K 4.5 4.3 4.2  CL 107 105 109*  CO2 23* 27* 27*  BUN 17 23* 20  CREATININE 0.8 0.7 0.7  CALCIUM 9.4 9.7 9.0   Recent Labs    03/29/20 0000 05/23/20 0000 10/12/20 0000  AST 10* 10* 11*  ALT 7 3* 5*  ALKPHOS  61 60 53  ALBUMIN 3.8 4.2 3.6   Recent Labs    05/23/20 0000 10/12/20 0000 11/22/20 0000  WBC 7.4 4.1 6.0  NEUTROABS 6,083.00 2,817.00 4,620.00  HGB 12.4 11.0* 11.8*  HCT 38 35* 36  PLT 174 160 181   Lab Results  Component Value Date   TSH 1.30 03/29/2020   No results found for: HGBA1C Lab Results  Component Value Date   CHOL 185 03/24/2019   HDL 79 (A) 03/24/2019   LDLCALC 94 03/24/2019   LDLDIRECT 104.2 01/25/2013   TRIG 41 03/24/2019   CHOLHDL 2 08/29/2015    Significant Diagnostic Results in last 30 days:  No results found.  Assessment/Plan: Dysuria c/o discomfort upon urination, denied nausea, vomiting, abd pain. She is afebrile. Will obtain UA C/S to r/i UTI given Hx of UTI.   Wound healing, delayed Slow healing skin tear anterior left shin above the ankle, no s/s of infection,  pending wound care center referral and ABI.   GERD (gastroesophageal reflux disease) Stable,  takes Protonix  Bilateral lower extremity edema The patient has chronic edema BLE. Off Metolazone 09/27/19. Echo EF normal in the past.   Atrial fibrillation (HCC)  heart rate is in control, on Xarelt,  off Diltiazem. Hgb 11.8 11/20/20, TSH 1.30 03/29/20  Osteoarthritis, multiple sites Managed on Tylenol, Methocarbamol 264m qd.     Parkinsonism  ([place] symptoms improved, on Sinemet, Comtan, [facility], saw Ophthalmology   Hypotension improved, takes Midodrine.   Anemia stable, Hgb 11.8 11/20/20, takes Fe  Urge incontinence of urine Urge incontinent of urine, takes Mirabegron.   Slow transit constipation Stable,  takes MFreight forwarderCommunication: plan of care reviewed with the patient and charge nurse.   Labs/tests ordered:  UA C/S, pending ABI   Time spend 35 minutes.

## 2020-12-19 NOTE — Assessment & Plan Note (Addendum)
Slow healing skin tear anterior left shin above the ankle, no s/s of infection,  pending wound care center referral and ABI.

## 2020-12-19 NOTE — Assessment & Plan Note (Signed)
Urge incontinent of urine, takes Mirabegron.

## 2020-12-19 NOTE — Assessment & Plan Note (Addendum)
Managed on Tylenol, Methocarbamol 250mg  qd.

## 2020-12-19 NOTE — Assessment & Plan Note (Signed)
symptoms improved, on Sinemet, Comtan, f/u neurology at [facility]. double vision, chronic, saw Ophthalmology

## 2020-12-19 NOTE — Assessment & Plan Note (Signed)
Stable,  takes Protonix

## 2020-12-19 NOTE — Assessment & Plan Note (Signed)
stable, Hgb 11.8 11/20/20, takes Fe

## 2020-12-19 NOTE — Assessment & Plan Note (Signed)
c/o discomfort upon urination, denied nausea, vomiting, abd pain. She is afebrile. Will obtain UA C/S to r/i UTI given Hx of UTI.

## 2020-12-20 ENCOUNTER — Encounter: Payer: Self-pay | Admitting: Nurse Practitioner

## 2020-12-20 DIAGNOSIS — D649 Anemia, unspecified: Secondary | ICD-10-CM | POA: Diagnosis not present

## 2020-12-20 DIAGNOSIS — I10 Essential (primary) hypertension: Secondary | ICD-10-CM | POA: Diagnosis not present

## 2020-12-24 ENCOUNTER — Encounter: Payer: Self-pay | Admitting: Orthopedic Surgery

## 2020-12-24 ENCOUNTER — Other Ambulatory Visit: Payer: Self-pay | Admitting: Orthopedic Surgery

## 2020-12-24 ENCOUNTER — Non-Acute Institutional Stay (SKILLED_NURSING_FACILITY): Payer: Medicare PPO | Admitting: Orthopedic Surgery

## 2020-12-24 DIAGNOSIS — N390 Urinary tract infection, site not specified: Secondary | ICD-10-CM | POA: Diagnosis not present

## 2020-12-24 DIAGNOSIS — B9620 Unspecified Escherichia coli [E. coli] as the cause of diseases classified elsewhere: Secondary | ICD-10-CM | POA: Diagnosis not present

## 2020-12-24 NOTE — Progress Notes (Signed)
Location:   [facility] Room Number: 13-A Place of Service:  SNF [phone]) Provider:  Windell Moulding, NP    Patient Care Team: [name], MD as PCP - General (Internal Medicine) [name], [name], MD as PCP - Cardiology (Cardiology) [name], [name], DO as Consulting Physician (Neurology) Mast, Man X, NP as Nurse Practitioner (Internal Medicine)  Extended Emergency Contact Information Primary Emergency Contact: Debbi, [place] Mobile Phone: [phone] Relation: Daughter Interpreter needed? No Secondary Emergency Contact: [facility] Address: [address]          [place], [place] [address] [name] of [place] Phone: [phone] Mobile Phone: [phone] Relation: Son  Code Status: DNR  Goals of care: Advanced Directive information Advanced Directives 12/24/2020  Does Patient Have a Medical Advance Directive? Yes  Type of Paramedic of [place];Living will;Out of facility DNR (pink MOST or yellow form)  Does patient want to make changes to medical advance directive? No - Patient declined  Copy of [place] in Chart? Yes - validated most recent copy scanned in chart (See row information)  Would patient like information on creating a medical advance directive? -  Pre-existing out of facility DNR order (yellow form or pink MOST form) -     Chief Complaint  Patient presents with   Acute Visit    Dysuria.    HPI:  Pt is a 80 y.o. Shaw seen today for an acute visit for dysuria.   11/30 she was seen by provider due to dysuria, U/A and culture ordered. UA revealed trace ketones, 2+ leukocytes, WBC and bacteria. Urine culture revealed greater than 100,000 cfu/ml of E. Coli.   Today, she continues to c/o dysuria. She is trying to increase her water intake to help with symptoms. She was treated 11/15/2020 for UTI, culture revealed Proteus Mirabilis > 100,000 cfu/ml, given 7 day course of amoxicillin.    05/23/2020 UTI,  K. Pneumoniae > 100,00 cfu/ml, given 7 day course of nitrofurantoin.   Past Medical History:  Diagnosis Date   Acute lower UTI 09/14/2018   Anemia 10/15/2018   2016 colonoscopy 10/14/18 wbc 5.4, Hgb 10.1, plt 184 12/01/18 wbc 4.5, Hgb 11.1, plt 188, neutrophils 63,  Na 139, K 3.7, Bun 23, creat 0.69, eGFR 83 on Fe, Hgb 11.7 12/09/18    Atrial fibrillation ([place]) 05/21/2010   10/07/18 Na 143, K 4.0, Bun 20, creat 0.79, eGFR 72, wbc 7.1, Hgb 10.9, plt 172 10/28/18 Na 142, K 4.0, Bun 19, creat 0.77, eGFR 74 11/18/18 Na 144, K 4.1, Bun 21, creat 0.69, eGFR 83   Bilateral lower extremity edema 07/19/2018   11/02/18 BMP 2 weeks.    Cervical pain (neck) 06/20/2010   Closed fracture of part of upper end of humerus 05/01/2015   Colles' fracture of right radius 03/05/2015   Constipation 07/19/2018   DEGENERATIVE JOINT DISEASE, RIGHT HIP 02/16/2007   Dupuytren's contracture    Dysuria 09/20/2018   09/20/18 c/o got up several times last night to go urinate, burning on urination, lower abd /back discomfort, but urinary frequency, leakage are not new. UA C/S, Pyridium 156m tid x 2 days.  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   S/P breast biopsy, left    two o'clock position - benign   Toxic effect of venom(989.5) 07/27/2007   Tremor, unspecified 10/19/2015   Unstable gait 02/16/2017   Vaginal atrophy 10/19/2015   Venous insufficiency    Weakness 09/12/2018   Past Surgical History:  Procedure Laterality Date   BREAST BIOPSY Left    CATARACT  EXTRACTION, BILATERAL      Allergies  Allergen Reactions   Doxycycline Nausea And Vomiting   Sulfonamide Derivatives     REACTION: HIVES    Allergies as of 12/24/2020       Reactions   Doxycycline Nausea And Vomiting   Sulfonamide Derivatives    REACTION: HIVES        Medication List        Accurate as of December 24, 2020  3:23 PM. If you have any questions, ask your nurse or doctor.          STOP taking these medications    carbamide peroxide 6.5 % OTIC solution Commonly known as: DEBROX Stopped by: Yvonna Alanis, NP   hydroxypropyl methylcellulose / hypromellose 2.5 % ophthalmic solution Commonly known as: ISOPTO TEARS / GONIOVISC Stopped by: Yvonna Alanis, NP       TAKE these medications    acetaminophen 500 MG tablet Commonly known as: TYLENOL Take 1,000 mg by mouth 3 (three) times daily as needed.   acetaminophen 500 MG tablet Commonly known as: TYLENOL Take 1,000 mg by mouth at bedtime. administer at 9:30pm per resident request DO NOT EXCEED 306m IN A 24 HOUR PERIOD   ARTIFICIAL TEARS OP Place 1 drop into both eyes 3 (three) times daily.   calcium carbonate 750 MG chewable tablet Commonly known as: TUMS EX Chew 1 tablet by mouth daily. Between the hours of 7am-10am.   carbidopa-levodopa 25-100 MG tablet Commonly known as: SINEMET IR Take 2 tablets by mouth 5 (five) times daily.   carbidopa-levodopa 50-200 MG tablet Commonly known as: SINEMET CR Take 1 tablet by mouth at bedtime.   carbidopa-levodopa 25-100 MG tablet Commonly known as: SINEMET IR Take 1 tablet by mouth once as needed. May give one additional tablet daily in addition to scheduled dose as needed for increased symptoms related to Parkinson's. For taking at 11am if needed. Do not take after 1130 or within one hour of next dose.   Cranberry 450 MG Tabs Take 1 tablet by mouth daily.   diclofenac Sodium 1 % Gel Commonly known as: VOLTAREN Apply topically 3 (three) times daily as  needed.   entacapone 200 MG tablet Commonly known as: COMTAN Take 200 mg by mouth 3 (three) times daily.   ferrous sulfate 325 (65 FE) MG tablet Take 325 mg by mouth every Monday, Wednesday, and Friday. Give with food   hydrocortisone 25 MG suppository Commonly known as: ANUSOL-HC Place 25 mg rectally daily as needed for hemorrhoids or anal itching.   levofloxacin 750 MG tablet Commonly known as: LEVAQUIN Take 750 mg by mouth daily.   methocarbamol 500 MG tablet Commonly known as: ROBAXIN Take 250 mg by mouth daily. As needed   midodrine 5 MG tablet Commonly known as: PROAMATINE Take 5 mg by mouth 3 (three) times daily with meals.   mirabegron ER 50 MG Tb24 tablet Commonly known  as: MYRBETRIQ Take 50 mg by mouth daily.   NUTRITIONAL SUPPLEMENT PO Take by mouth daily in the afternoon. BOOST   pantoprazole 40 MG tablet Commonly known as: PROTONIX Take 40 mg by mouth daily.   polyethylene glycol 17 g packet Commonly known as: MIRALAX / GLYCOLAX Take 17 g by mouth daily.   PreviDent 5000 Booster Plus 1.1 % Pste Generic drug: Sodium Fluoride Place 1 application onto teeth in the morning and at bedtime.   rivaroxaban 20 MG Tabs tablet Commonly known as: XARELTO Take 20 mg by mouth every evening. Between the hours of 5pm-6pm.   saccharomyces boulardii 250 MG capsule Commonly known as: FLORASTOR Take 250 mg by mouth 2 (two) times daily.   simethicone 80 MG chewable tablet Commonly known as: MYLICON Chew 80 mg by mouth 3 (three) times daily. What changed: Another medication with the same name was removed. Continue taking this medication, and follow the directions you see here. Changed by: Yvonna Alanis, NP   Sodium Chloride 3 % Aers Place 1 spray into the nose 2 (two) times daily.   Vitamin D3 10 MCG (400 UNIT) Caps Take 1 capsule by mouth daily. Between the hours of 7am-10am.   zinc oxide 20 % ointment Apply 1 application topically as needed for irritation. To  buttocks after every incontinent episode and as needed for redness. May keep at bedside.        Review of Systems  Constitutional:  Negative for activity change, appetite change, chills and fatigue.  Respiratory:  Negative for cough, shortness of breath and wheezing.   Cardiovascular:  Negative for chest pain and leg swelling.  Genitourinary:  Positive for dysuria, frequency and urgency. Negative for hematuria.  Psychiatric/Behavioral:  Negative for dysphoric mood. The patient is not nervous/anxious.    Immunization History  Administered Date(s) Administered   Influenza Split 10/20/2012   Influenza Whole 10/21/2006   Influenza, High Dose Seasonal PF 10/17/2016, 11/02/2019   Influenza-Unspecified 10/20/2013, 10/22/2017, 11/07/2020   Moderna Sars-Covid-2 Vaccination 01/22/2019, 02/19/2019, 11/29/2019, 06/19/2020   Pfizer Covid-19 Vaccine Bivalent Booster 50yr & up 10/10/2020   Pneumococcal Conjugate-13 01/31/2013   Pneumococcal Polysaccharide-23 01/20/2005, 11/24/2011   Td 01/21/2004   Tdap 03/16/2014   Zoster, Live 04/17/2009   Pertinent  Health Maintenance Due  Topic Date Due   INFLUENZA VACCINE  Completed   DEXA SCAN  Completed   COLONOSCOPY (Pts [phone] coverage will need to be confirmed)  Discontinued   Fall Risk 09/11/2018 09/12/2018 09/12/2018 09/13/2018 09/14/2018  Falls in the past year? - - - - -  Was there an injury with Fall? - - - - -  Fall Risk Category Calculator - - - - -  Fall Risk Category - - - - -  Patient Fall Risk Level High fall risk High fall risk High fall risk High fall risk High fall risk  Patient at Risk for Falls Due to - - - - -  Fall risk Follow up - - - - -   Functional Status Survey:    Vitals:   12/24/20 1457  BP: 92/64  Pulse: 83  Resp: 18  Temp: 97.6 F (36.4 C)  SpO2: 97%  Weight: 131 lb 3.2 oz (59.5 kg)  Height: '5\' 8"'  (1.727 m)   Body mass index is 19.95 kg/m. Physical Exam Vitals reviewed.  Constitutional:       General: She is not in acute distress. HENT:     Head: Normocephalic.  Cardiovascular:  Rate and Rhythm: Normal rate. Rhythm irregular.     Pulses: Normal pulses.     Heart sounds: Normal heart sounds. No murmur heard. Pulmonary:     Effort: Pulmonary effort is normal. No respiratory distress.     Breath sounds: Normal breath sounds. No wheezing.  Abdominal:     General: Bowel sounds are normal. There is no distension.     Palpations: Abdomen is soft.     Tenderness: There is no abdominal tenderness.  Musculoskeletal:     Right lower leg: No edema.     Left lower leg: No edema.  Skin:    General: Skin is warm and dry.     Capillary Refill: Capillary refill takes less than 2 seconds.  Neurological:     General: No focal deficit present.     Mental Status: She is alert and oriented to person, place, and time.     Motor: Weakness present.     Gait: Gait abnormal.  Psychiatric:        Mood and Affect: Mood normal.        Behavior: Behavior normal.    Labs reviewed: Recent Labs    03/29/20 0000 05/23/20 0000 10/12/20 0000  NA 141 139 141  K 4.5 4.3 4.2  CL Stephanie 105 109*  CO2 23* 27* 27*  BUN 17 23* 20  CREATININE 0.8 0.7 0.7  CALCIUM 9.4 9.7 9.0   Recent Labs    03/29/20 0000 05/23/20 0000 10/12/20 0000  AST 10* 10* 11*  ALT 7 3* 5*  ALKPHOS 61 60 53  ALBUMIN 3.8 4.2 3.6   Recent Labs    05/23/20 0000 10/12/20 0000 11/22/20 0000  WBC 7.4 4.1 6.0  NEUTROABS 6,083.00 2,817.00 4,620.00  HGB 12.4 11.0* 11.8*  HCT 38 35* 36  PLT 174 160 181   Lab Results  Component Value Date   TSH 1.30 03/29/2020   No results found for: HGBA1C Lab Results  Component Value Date   CHOL 185 03/24/2019   HDL 79 (A) 03/24/2019   LDLCALC 94 03/24/2019   LDLDIRECT 104.2 01/25/2013   TRIG 41 03/24/2019   CHOLHDL 2 08/29/2015    Significant Diagnostic Results in last 30 days:  No results found.  Assessment/Plan 1. E-coli UTI - symptoms of dysuria began 11/30 -  culture > 100,000 cfu/ml E. Coli - start Levaquin 750 mg po daily x 5 days - encourage hydration with water - cont cranberry supplement for prevention  2. 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 Pneumoniae > 100,00 cfu/ml, given 7 day course of nitrofurantoin - recommend urology consult- will discuss with patient    Family/ staff Communication: plan discussed with patient and nurse  Labs/tests ordered: none

## 2020-12-25 DIAGNOSIS — L97811 Non-pressure chronic ulcer of other part of right lower leg limited to breakdown of skin: Secondary | ICD-10-CM | POA: Diagnosis not present

## 2020-12-25 DIAGNOSIS — L97821 Non-pressure chronic ulcer of other part of left lower leg limited to breakdown of skin: Secondary | ICD-10-CM | POA: Diagnosis not present

## 2021-01-18 ENCOUNTER — Non-Acute Institutional Stay (SKILLED_NURSING_FACILITY): Payer: Medicare PPO | Admitting: Nurse Practitioner

## 2021-01-18 ENCOUNTER — Encounter: Payer: Self-pay | Admitting: Nurse Practitioner

## 2021-01-18 DIAGNOSIS — G903 Multi-system degeneration of the autonomic nervous system: Secondary | ICD-10-CM

## 2021-01-18 DIAGNOSIS — I872 Venous insufficiency (chronic) (peripheral): Secondary | ICD-10-CM | POA: Diagnosis not present

## 2021-01-18 DIAGNOSIS — K219 Gastro-esophageal reflux disease without esophagitis: Secondary | ICD-10-CM | POA: Diagnosis not present

## 2021-01-18 DIAGNOSIS — K5901 Slow transit constipation: Secondary | ICD-10-CM | POA: Diagnosis not present

## 2021-01-18 DIAGNOSIS — B351 Tinea unguium: Secondary | ICD-10-CM

## 2021-01-18 DIAGNOSIS — R600 Localized edema: Secondary | ICD-10-CM

## 2021-01-18 DIAGNOSIS — I480 Paroxysmal atrial fibrillation: Secondary | ICD-10-CM

## 2021-01-18 DIAGNOSIS — M19011 Primary osteoarthritis, right shoulder: Secondary | ICD-10-CM | POA: Diagnosis not present

## 2021-01-18 DIAGNOSIS — I739 Peripheral vascular disease, unspecified: Secondary | ICD-10-CM

## 2021-01-18 DIAGNOSIS — D500 Iron deficiency anemia secondary to blood loss (chronic): Secondary | ICD-10-CM

## 2021-01-18 DIAGNOSIS — T148XXD Other injury of unspecified body region, subsequent encounter: Secondary | ICD-10-CM

## 2021-01-18 DIAGNOSIS — N3941 Urge incontinence: Secondary | ICD-10-CM

## 2021-01-18 NOTE — Assessment & Plan Note (Signed)
12/25/20 ABI R+L 0.7, moderate arterial disease, vascular specialist if desires.  

## 2021-01-18 NOTE — Assessment & Plan Note (Signed)
Off Metolazone 09/27/19. Echo EF normal in the past.  

## 2021-01-18 NOTE — Assessment & Plan Note (Signed)
stable, Hgb 11.8 11/22/20,  takes Fe 

## 2021-01-18 NOTE — Assessment & Plan Note (Signed)
on Tylenol, Methocarbamol 250mg  qd.

## 2021-01-18 NOTE — Assessment & Plan Note (Addendum)
Stable, takes MiraLax  

## 2021-01-18 NOTE — Progress Notes (Signed)
Location:   S[facility] [place] Room Number: 13 Place of Service:  SNF (31) Provider: [facility] Stephanie Egley NP  Stephanie Dad, MD  Patient Care Team: Stephanie Dad, MD as PCP - General (Internal Medicine) Stephanie, [name], MD as PCP - Cardiology (Cardiology) Shaw, Stephanie Quail, DO as Consulting Physician (Neurology) Stephanie Mealy X, NP as Nurse Practitioner (Internal Medicine)  Extended Emergency Contact Information Primary Emergency Contact: Stephanie, Shaw Mobile Phone: [phone] Relation: Daughter Interpreter needed? No Secondary Emergency Contact: Stephanie Shaw Address: [address]          [place], [place] [address] Stephanie Shaw Phone: [phone] Mobile Phone: [phone] Relation: Son  Code Status:  DNR Goals of care: Advanced Directive information Advanced Directives 01/18/2021  Does Patient Have a Medical Advance Directive? Yes  Type of Paramedic of [place];Out of facility DNR (pink MOST or yellow form)  Does patient want to make changes to medical advance directive? No - Patient declined  Copy of [place] in Chart? Yes - validated most recent copy scanned in chart (See row information)  Would patient like information on creating a medical advance directive? -  Pre-existing out of facility DNR order (yellow form or pink MOST form) -     Chief Complaint  Patient presents with   Medical Management of Chronic Issues    Routine follow up visit   Health Maintenance    Zoster vaccine    HPI:  Pt is a 80 y.o. female seen today for medical management of chronic diseases.       Slow healing skin tear anterior left shin above the ankle, pending wound care [facility] evaluation  PAD 12/25/20 ABI R+L 0.7, moderate arterial disease, vascular specialist if desires.              GERD, takes Protonix             The patient has chronic edema BLE. Off Metolazone 09/27/19. Echo EF normal in the past.              Afib, heart  rate is in control, on Xarelto, off Diltiazem. Hgb 11.8 11/22/20, TSH 1.30 03/29/20             OA,  on Tylenol, Methocarbamol 228m qd.                Parkinson's, symptoms improved, on Sinemet, Comtan, [facility], chronic, saw Ophthalmology              Orthostatic hypotension, improved, takes Midodrine.              Anemia, stable, Hgb 11.8 11/22/20,  takes Fe             Urge incontinent of urine, takes Mirabegron.              Constipation, takes MiraLax  Past Medical History:  Diagnosis Date   Acute lower UTI 09/14/2018   Anemia 10/15/2018   2016 colonoscopy 10/14/18 wbc 5.4, Hgb 10.1, plt 184 12/01/18 wbc 4.5, Hgb 11.1, plt 188, neutrophils 63,  Na 139, K 3.7, Bun 23, creat 0.69, eGFR 83 on Fe, Hgb 11.7 12/09/18    [place] 05/21/[place]   10/07/18 Na 143, K 4.0, Bun 20, creat 0.79, eGFR 72, wbc 7.1, Hgb 10.9, plt 172 10/28/18 Na 142, K 4.0, Bun 19, creat 0.77, eGFR 74 11/18/18 Na 144, K 4.1, Bun 21, creat 0.69, eGFR  83   Bilateral lower extremity edema 07/19/2018   11/02/18 BMP 2 weeks.    Cervical pain (neck) 06/20/2010   Closed fracture of part of upper end of humerus 05/01/2015   Colles' fracture of right radius 03/05/2015   Constipation 07/19/2018   DEGENERATIVE JOINT DISEASE, RIGHT HIP 02/16/2007   Dupuytren's contracture    Dysuria 09/20/2018   09/20/18 c/o got up several times last night to go urinate, burning on urination, lower abd /back discomfort, but urinary frequency, leakage are not new. UA C/S, Pyridium 158m tid [facility] 2 days.  09/21/18 wbc 6.1, Hgb 11.8, plt 210, neutrophil 69.4, Na 142, K 4.1, Bun 19, creat 0.78, TP 6.4, albumin 3.9   Hematuria 02/04/2014   Hypotension 09/18/2018   Long term (current) use of anticoagulants 05/29/2016   Osteoarthritis of hip    right   Osteoarthritis of left knee    Overactive bladder 10/06/2018   Parkinson disease ([place]    Paroxysmal A-fib ([facility]    POSTMENOPAUSAL SYNDROME 02/16/2007    PREMATURE ATRIAL CONTRACTIONS 02/16/2007   Primary osteoarthritis of right shoulder 04/27/2017   S/P breast biopsy, left    two o'clock position - benign   Toxic effect of venom(989.5) 07/27/2007   Tremor, unspecified 10/19/2015   Unstable gait 02/16/2017   Vaginal atrophy 10/19/2015   Venous insufficiency    Weakness 09/12/2018   Past Surgical History:  Procedure Laterality Date   BREAST BIOPSY Left    CATARACT EXTRACTION, BILATERAL      Allergies  Allergen Reactions   Doxycycline Nausea And Vomiting   Sulfonamide Derivatives     REACTION: HIVES    Allergies as of 01/18/2021       Reactions   Doxycycline Nausea And Vomiting   Sulfonamide Derivatives    REACTION: HIVES        Medication List        Accurate as of January 18, 2021 11:59 PM. If you have any questions, ask your nurse or doctor.          STOP taking these medications    levofloxacin 750 MG tablet Commonly known as: LEVAQUIN Stopped by: Stephanie Shaw Shaw Stephanie Manzer, NP   saccharomyces boulardii 250 MG capsule Commonly known as: FLORASTOR Stopped by: Gideon Burstein Shaw Dontray Haberland, NP       TAKE these medications    acetaminophen 500 MG tablet Commonly known as: TYLENOL Take 1,000 mg by mouth 3 (three) times daily as needed.   acetaminophen 500 MG tablet Commonly known as: TYLENOL Take 1,000 mg by mouth at bedtime. administer at 9:30pm per resident request DO NOT EXCEED 30056mIN A 24 HOUR PERIOD   ARTIFICIAL TEARS OP Place 1 drop into both eyes 3 (three) times daily.   calcium carbonate 750 MG chewable tablet Commonly known as: TUMS EX Chew 1 tablet by mouth daily. Between the hours of 7am-10am.   carbidopa-levodopa 25-100 MG tablet Commonly known as: SINEMET IR Take 2 tablets by mouth 5 (five) times daily.   carbidopa-levodopa 50-200 MG tablet Commonly known as: SINEMET CR Take 1 tablet by mouth at bedtime.   carbidopa-levodopa 25-100 MG tablet Commonly known as: SINEMET IR Take 1 tablet by mouth once as  needed. May give one additional tablet daily in addition to scheduled dose as needed for increased symptoms related to Parkinson's. For taking at 11am if needed. Do not take after 1130 or within one hour of next dose.   Cranberry 450 MG Tabs Take 1 tablet by mouth daily.  diclofenac Sodium 1 % Gel Commonly known as: VOLTAREN Apply topically 3 (three) times daily as needed.   entacapone 200 MG tablet Commonly known as: COMTAN Take 200 mg by mouth 3 (three) times daily.   ferrous sulfate 325 (65 FE) MG tablet Take 325 mg by mouth every Monday, Wednesday, and Friday. Give with food   hydrocortisone 25 MG suppository Commonly known as: ANUSOL-HC Place 25 mg rectally daily as needed for hemorrhoids or anal itching.   methocarbamol 500 MG tablet Commonly known as: ROBAXIN Take 250 mg by mouth daily. As needed   midodrine 5 MG tablet Commonly known as: PROAMATINE Take 5 mg by mouth 3 (three) times daily with meals.   mirabegron ER 50 MG Tb24 tablet Commonly known as: MYRBETRIQ Take 50 mg by mouth daily.   NUTRITIONAL SUPPLEMENT PO Take by mouth daily in the afternoon. BOOST   pantoprazole 40 MG tablet Commonly known as: PROTONIX Take 40 mg by mouth daily.   polyethylene glycol 17 g packet Commonly known as: MIRALAX / GLYCOLAX Take 17 g by mouth daily.   PreviDent 5000 Booster Plus 1.1 % Pste Generic drug: Sodium Fluoride Place 1 application onto teeth in the morning and at bedtime.   rivaroxaban 20 MG Tabs tablet Commonly known as: XARELTO Take 20 mg by mouth every evening. Between the hours of 5pm-6pm.   simethicone 80 MG chewable tablet Commonly known as: MYLICON Chew 80 mg by mouth 3 (three) times daily.   Sodium Chloride 3 % Aers Place 1 spray into the nose 2 (two) times daily.   Vitamin D3 10 MCG (400 UNIT) Caps Take 1 capsule by mouth daily. Between the hours of 7am-10am.   zinc oxide 20 % ointment Apply 1 application topically as needed for irritation.  To buttocks after every incontinent episode and as needed for redness. May keep at bedside.        Review of Systems  Constitutional:  Negative for appetite change, fatigue and fever.  HENT:  Positive for hearing loss. Negative for congestion, nosebleeds and trouble swallowing.   Eyes:  Positive for visual disturbance.       C/o double vision  Respiratory:  Negative for shortness of breath.   Cardiovascular:  Positive for leg swelling.  Gastrointestinal:  Negative for abdominal pain and constipation.       Suprapubic region discomfort.   Genitourinary:  Positive for frequency. Negative for difficulty urinating, dysuria, hematuria and urgency.       Incontinent of urine, c/o discomfort upon urination.   Musculoskeletal:  Positive for arthralgias, back pain and gait problem. Negative for myalgias.       Lower back discomfort positional. Chronic shoulder pain L>R, full ROM  Skin:  Positive for wound.  Neurological:  Positive for tremors. Negative for speech difficulty and headaches.       Moves slow, fine tremor in fingers, burning sensation  in the R+ L great toes when touched by sheets, comes/goes  Psychiatric/Behavioral:  Negative for behavioral problems and sleep disturbance. The patient is nervous/anxious.        Sleeps from 9pm to 4-5am usually, feels anxious sometimes.    Immunization History  Administered Date(s) Administered   Influenza Split 10/20/2012   Influenza Whole 10/21/2006   Influenza, High Dose Seasonal PF 10/17/2016, 11/02/2019   Influenza-Unspecified 10/20/2013, 10/22/2017, 11/07/2020   Moderna Sars-Covid-2 Vaccination 01/22/2019, 02/19/2019, 11/29/2019, 06/19/2020   Pfizer Covid-19 Vaccine Bivalent Booster 14yr & up 10/10/2020   Pneumococcal Conjugate-13 01/31/2013   Pneumococcal  Polysaccharide-23 01/20/2005, 11/24/2011   Td 01/21/2004   Tdap 03/16/2014   Zoster, Live 04/17/2009   Pertinent  Health Maintenance Due  Topic Date Due   INFLUENZA  VACCINE  Completed   DEXA SCAN  Completed   COLONOSCOPY (Pts 45-24yr Insurance coverage will need to be confirmed)  Discontinued   Fall Risk 09/11/2018 09/12/2018 09/12/2018 09/13/2018 09/14/2018  Falls in the past year? - - - - -  Was there an injury with Fall? - - - - -  Fall Risk Category Calculator - - - - -  Fall Risk Category - - - - -  Patient Fall Risk Level High fall risk High fall risk High fall risk High fall risk High fall risk  Patient at Risk for Falls Due to - - - - -  Fall risk Follow up - - - - -   Functional Status Survey:    Vitals:   01/18/21 1118  BP: 136/80  Pulse: 64  Temp: 97.8 F (36.6 C)  SpO2: 96%  Weight: 132 lb 12.8 oz (60.2 kg)  Height: '5\' 8"'  (1.727 m)   Body mass index is 20.19 kg/m. Physical Exam Vitals and nursing note reviewed.  Constitutional:      Appearance: Normal appearance.  HENT:     Head: Normocephalic and atraumatic.     Mouth/Throat:     Mouth: Mucous membranes are moist.  Eyes:     Extraocular Movements: Extraocular movements intact.     Conjunctiva/sclera: Conjunctivae normal.     Pupils: Pupils are equal, round, and reactive to light.     Comments: Chronic double vision, see Ophthalmology 09/20/20  Cardiovascular:     Rate and Rhythm: Normal rate. Rhythm irregular.     Heart sounds: No murmur heard.    Comments: DP pulses present L>R Pulmonary:     Effort: Pulmonary effort is normal.     Breath sounds: No rales.  Abdominal:     General: Bowel sounds are normal.     Palpations: Abdomen is soft.     Tenderness: There is no abdominal tenderness.  Musculoskeletal:        General: Tenderness present.     Cervical back: Normal range of motion and neck supple.     Right lower leg: Edema present.     Left lower leg: Edema present.     Comments: trace edema LLE, 1+ RLE. Chronic lower back, shoulder R+L pain.   Skin:    General: Skin is warm and dry.     Comments: About 1x2 cm slow healing wound from previous skin tear, no  depth, redness, warmth, tenderness, or drainage. ABI showed moderate PAD  Neurological:     General: No focal deficit present.     Mental Status: She is alert and oriented to person, place, and time. Mental status is at baseline.     Coordination: Coordination abnormal.     Gait: Gait abnormal.     Comments: Moves slow, tremor in hands. No facial or limb weakness.   Psychiatric:        Mood and Affect: Mood normal.        Behavior: Behavior normal.    Labs reviewed: Recent Labs    03/29/20 0000 05/23/20 0000 10/12/20 0000  NA 141 139 141  K 4.5 4.3 4.2  CL 107 105 109*  CO2 23* 27* 27*  BUN 17 23* 20  CREATININE 0.8 0.7 0.7  CALCIUM 9.4 9.7 9.0   Recent Labs  03/29/20 0000 05/23/20 0000 10/12/20 0000  AST 10* 10* 11*  ALT 7 3* 5*  ALKPHOS 61 60 53  ALBUMIN 3.8 4.2 3.6   Recent Labs    05/23/20 0000 10/12/20 0000 11/22/20 0000  WBC 7.4 4.1 6.0  NEUTROABS 6,083.00 2,817.00 4,620.00  HGB 12.4 11.0* 11.8*  HCT 38 35* 36  PLT 174 160 181   Lab Results  Component Value Date   TSH 1.30 03/29/2020   No results found for: HGBA1C Lab Results  Component Value Date   CHOL 185 03/24/2019   HDL 79 (A) 03/24/2019   LDLCALC 94 03/24/2019   LDLDIRECT 104.2 01/25/2013   TRIG 41 03/24/2019   CHOLHDL 2 08/29/2015    Significant Diagnostic Results in last 30 days:  No results found.  Assessment/Plan  GERD (gastroesophageal reflux disease) Stable, takes Protonix  Edema of left lower extremity due to peripheral venous insufficiency chronic edema BLE. Off Metolazone 09/27/19. Echo EF normal in the past.   Atrial fibrillation (HCC) heart rate is in control, on Xarelto,  off Diltiazem. Hgb 11.8 11/22/20, TSH 1.30 03/29/20  Primary osteoarthritis of right shoulder on Tylenol, Methocarbamol 283m qd.     Onychomycosis of right great toe  symptoms improved, on Sinemet, Comtan, [facility], chronic, saw Ophthalmology   Hypotension   improved, takes Midodrine.   Anemia stable, Hgb 11.8 11/22/20,  takes Fe  Urge incontinence of urine Stable,  takes Mirabegron.   Slow transit constipation Stable, takes MiraLax  Wound healing, delayed Slow healing skin tear anterior left shin above the ankle, pending wound care [facility] evaluation.   Bilateral lower extremity edema Off Metolazone 09/27/19. Echo EF normal in the past.   PAD (peripheral artery disease) ([place] 12/25/20 ABI R+L 0.7, moderate arterial disease, vascular specialist if desires.    Family/ staff Communication: plan of care reviewed with the patient and charge nurse.   Labs/tests ordered:  none  Time spend 35 minutes.

## 2021-01-18 NOTE — Assessment & Plan Note (Signed)
Stable,  takes Mirabegron.

## 2021-01-18 NOTE — Assessment & Plan Note (Signed)
Stable, takes Protonix

## 2021-01-18 NOTE — Progress Notes (Deleted)
Location:   [place] Room Number: 13 Place of Service:  SNF ([id number]) Provider:  Elenora Fender, NP  [name], MD  Patient Care Team: [name], MD as PCP - General (Internal Medicine) [name], [name], MD as PCP - Cardiology (Cardiology) [name], [name], DO as Consulting Physician (Neurology) Mast, Man X, NP as Nurse Practitioner (Internal Medicine)  Extended Emergency Contact Information Primary Emergency Contact: Shaquoya, Cosper Mobile Phone: [phone] Relation: Daughter Interpreter needed? No Secondary Emergency Contact: [facility] Address: [address]          [place],  [address] [name] of [place] Phone: [phone] Mobile Phone: [phone] Relation: Son  Code Status:   Goals of care: Advanced Directive information Advanced Directives 01/18/2021  Does Patient Have a Medical Advance Directive? Yes  Type of Paramedic of [place];Out of facility DNR (pink MOST or yellow form)  Does patient want to make changes to medical advance directive? No - Patient declined  Copy of [place] in Chart? Yes - validated most recent copy scanned in chart (See row information)  Would patient like information on creating a medical advance directive? -  Pre-existing out of facility DNR order (yellow form or pink MOST form) -     Chief Complaint  Patient presents with   Medical Management of Chronic Issues    Routine follow up visit   Health Maintenance    Zoster vaccine    HPI:  Pt is a 80 y.o. female seen today for medical management of chronic diseases.     Past Medical History:  Diagnosis Date   Acute lower UTI 09/14/2018   Anemia 10/15/2018   2016 colonoscopy 10/14/18 wbc 5.4, Hgb 10.1, plt 184 12/01/18 wbc 4.5, Hgb 11.1, plt 188, neutrophils 63,  Na 139, K 3.7, Bun 23, creat 0.69, eGFR 83 on Fe, Hgb 11.7 12/09/18    Atrial fibrillation ([place]) 05/21/2010   10/07/18 Na 143, K 4.0,  Bun 20, creat 0.79, eGFR 72, wbc 7.1, Hgb 10.9, plt 172 10/28/18 Na 142, K 4.0, Bun 19, creat 0.77, eGFR 74 11/18/18 Na 144, K 4.1, Bun 21, creat 0.69, eGFR 83   Bilateral lower extremity edema 07/19/2018   11/02/18 BMP 2 weeks.    Cervical pain (neck) 06/20/2010   Closed fracture of part of upper end of humerus 05/01/2015   Colles' fracture of right radius 03/05/2015   Constipation 07/19/2018   DEGENERATIVE JOINT DISEASE, RIGHT HIP 02/16/2007   Dupuytren's contracture    Dysuria 09/20/2018   09/20/18 c/o got up several times last night to go urinate, burning on urination, lower abd /back discomfort, but urinary frequency, leakage are not new. UA C/S, Pyridium 140m tid x 2 days.  09/21/18 wbc 6.1, Hgb 11.8, plt 210, neutrophil 69.4, Na 142, K 4.1, Bun 19, creat 0.78, TP 6.4, albumin 3.9   Hematuria 02/04/2014   Hypotension 09/18/2018   Long term (current) use of anticoagulants 05/29/2016   Osteoarthritis of hip    right   Osteoarthritis of left knee    Overactive bladder 10/06/2018   Parkinson disease ([facility]    Par[place]mal A-fib ([facility]    POSTMENOPAUSAL SYNDROME 02/16/2007   PREMATURE ATRIAL CONTRACTIONS 02/16/2007   Primary osteoarthritis of right shoulder 04/27/2017   S/P breast biopsy, left    two o'clock position - benign   Toxic effect of venom(989.5) 07/27/2007   Tremor, unspecified 10/19/2015   Unstable gait 02/16/2017   Vaginal atrophy 10/19/2015  Venous insufficiency    Weakness 09/12/2018   Past Surgical History:  Procedure Laterality Date   BREAST BIOPSY Left    CATARACT EXTRACTION, BILATERAL      Allergies  Allergen Reactions   Doxycycline Nausea And Vomiting   Sulfonamide Derivatives     REACTION: HIVES    Allergies as of 01/18/2021       Reactions   Doxycycline Nausea And Vomiting   Sulfonamide Derivatives    REACTION: HIVES        Medication List        Accurate as of January 18, 2021  1:06 PM. If you have any questions, ask your nurse or doctor.          STOP  taking these medications    levofloxacin 750 MG tablet Commonly known as: LEVAQUIN Stopped by: Man X Mast, NP   saccharomyces boulardii 250 MG capsule Commonly known as: FLORASTOR Stopped by: Man X Mast, NP       TAKE these medications    acetaminophen 500 MG tablet Commonly known as: TYLENOL Take 1,000 mg by mouth 3 (three) times daily as needed.   acetaminophen 500 MG tablet Commonly known as: TYLENOL Take 1,000 mg by mouth at bedtime. administer at 9:30pm per resident request DO NOT EXCEED 3015m IN A 24 HOUR PERIOD   ARTIFICIAL TEARS OP Place 1 drop into both eyes 3 (three) times daily.   calcium carbonate 750 MG chewable tablet Commonly known as: TUMS EX Chew 1 tablet by mouth daily. Between the hours of 7am-10am.   carbidopa-levodopa 25-100 MG tablet Commonly known as: SINEMET IR Take 2 tablets by mouth 5 (five) times daily.   carbidopa-levodopa 50-200 MG tablet Commonly known as: SINEMET CR Take 1 tablet by mouth at bedtime.   carbidopa-levodopa 25-100 MG tablet Commonly known as: SINEMET IR Take 1 tablet by mouth once as needed. May give one additional tablet daily in addition to scheduled dose as needed for increased symptoms related to Parkinson's. For taking at 11am if needed. Do not take after 1130 or within one hour of next dose.   Cranberry 450 MG Tabs Take 1 tablet by mouth daily.   diclofenac Sodium 1 % Gel Commonly known as: VOLTAREN Apply topically 3 (three) times daily as needed.   entacapone 200 MG tablet Commonly known as: COMTAN Take 200 mg by mouth 3 (three) times daily.   ferrous sulfate 325 (65 FE) MG tablet Take 325 mg by mouth every Monday, Wednesday, and Friday. Give with food   hydrocortisone 25 MG suppository Commonly known as: ANUSOL-HC Place 25 mg rectally daily as needed for hemorrhoids or anal itching.   methocarbamol 500 MG tablet Commonly known as: ROBAXIN Take 250 mg by mouth daily. As needed   midodrine 5 MG  tablet Commonly known as: PROAMATINE Take 5 mg by mouth 3 (three) times daily with meals.   mirabegron ER 50 MG Tb24 tablet Commonly known as: MYRBETRIQ Take 50 mg by mouth daily.   NUTRITIONAL SUPPLEMENT PO Take by mouth daily in the afternoon. BOOST   pantoprazole 40 MG tablet Commonly known as: PROTONIX Take 40 mg by mouth daily.   polyethylene glycol 17 g packet Commonly known as: MIRALAX / GLYCOLAX Take 17 g by mouth daily.   PreviDent 5000 Booster Plus 1.1 % Pste Generic drug: Sodium Fluoride Place 1 application onto teeth in the morning and at bedtime.   rivaroxaban 20 MG Tabs tablet Commonly known as: XARELTO Take 20 mg by mouth  every evening. Between the hours of 5pm-6pm.   simethicone 80 MG chewable tablet Commonly known as: MYLICON Chew 80 mg by mouth 3 (three) times daily.   Sodium Chloride 3 % Aers Place 1 spray into the nose 2 (two) times daily.   Vitamin D3 10 MCG (400 UNIT) Caps Take 1 capsule by mouth daily. Between the hours of 7am-10am.   zinc oxide 20 % ointment Apply 1 application topically as needed for irritation. To buttocks after every incontinent episode and as needed for redness. May keep at bedside.        Review of Systems  Immunization History  Administered Date(s) Administered   Influenza Split 10/20/2012   Influenza Whole 10/21/2006   Influenza, High Dose Seasonal PF 10/17/2016, 11/02/2019   Influenza-Unspecified 10/20/2013, 10/22/2017, 11/07/2020   Moderna Sars-Covid-2 Vaccination 01/22/2019, 02/19/2019, 11/29/2019, 06/19/2020   Pfizer Covid-19 Vaccine Bivalent Booster 73yr & up 10/10/2020   Pneumococcal Conjugate-13 01/31/2013   Pneumococcal Polysaccharide-23 01/20/2005, 11/24/2011   Td 01/21/2004   Tdap 03/16/2014   Zoster, Live 04/17/2009   Pertinent  Health Maintenance Due  Topic Date Due   INFLUENZA VACCINE  Completed   DEXA SCAN  Completed   COLONOSCOPY (Pts 45-435yrInsurance coverage will need to be confirmed)   Discontinued   Fall Risk 09/11/2018 09/12/2018 09/12/2018 09/13/2018 09/14/2018  Falls in the past year? - - - - -  Was there an injury with Fall? - - - - -  Fall Risk Category Calculator - - - - -  Fall Risk Category - - - - -  Patient Fall Risk Level High fall risk High fall risk High fall risk High fall risk High fall risk  Patient at Risk for Falls Due to - - - - -  Fall risk Follow up - - - - -   Functional Status Survey:    Vitals:   01/18/21 1118  BP: 136/80  Pulse: 64  Temp: 97.8 F (36.6 C)  SpO2: 96%  Weight: 132 lb 12.8 oz (60.2 kg)  Height: '5\' 8"'  (1.727 m)   Body mass index is 20.19 kg/m. Physical Exam  Labs reviewed: Recent Labs    03/29/20 0000 05/23/20 0000 10/12/20 0000  NA 141 139 141  K 4.5 4.3 4.2  CL 107 105 109*  CO2 23* 27* 27*  BUN 17 23* 20  CREATININE 0.8 0.7 0.7  CALCIUM 9.4 9.7 9.0   Recent Labs    03/29/20 0000 05/23/20 0000 10/12/20 0000  AST 10* 10* 11*  ALT 7 3* 5*  ALKPHOS 61 60 53  ALBUMIN 3.8 4.2 3.6   Recent Labs    05/23/20 0000 10/12/20 0000 11/22/20 0000  WBC 7.4 4.1 6.0  NEUTROABS 6,083.00 2,817.00 4,620.00  HGB 12.4 11.0* 11.8*  HCT 38 35* 36  PLT 174 160 181   Lab Results  Component Value Date   TSH 1.30 03/29/2020   No results found for: HGBA1C Lab Results  Component Value Date   CHOL 185 03/24/2019   HDL 79 (A) 03/24/2019   LDLCALC 94 03/24/2019   LDLDIRECT 104.2 01/25/2013   TRIG 41 03/24/2019   CHOLHDL 2 08/29/2015    Significant Diagnostic Results in last 30 days:  No results found.  Assessment/Plan 1. Gastroesophageal reflux disease, unspecified whether esophagitis present ***  2. Edema of left lower extremity due to peripheral venous insufficiency ***  3. Paroxysmal atrial fibrillation (HCC) ***  4. Primary osteoarthritis of right shoulder ***  5. Onychomycosis of right great  toe ***  6. Neurogenic orthostatic hypotension (HCC) ***  7. Iron deficiency anemia due to chronic  blood loss ***  8. Urge incontinence of urine ***  9. Slow transit constipation ***  10. Wound healing, delayed ***    Family/ staff Communication:   Labs/tests ordered:

## 2021-01-18 NOTE — Assessment & Plan Note (Addendum)
Slow healing skin tear anterior left shin above the ankle, pending wound care center evaluation.

## 2021-01-18 NOTE — Assessment & Plan Note (Signed)
improved, takes Midodrine.

## 2021-01-18 NOTE — Assessment & Plan Note (Signed)
chronic edema BLE. Off Metolazone 09/27/19. Echo EF normal in the past.

## 2021-01-18 NOTE — Assessment & Plan Note (Addendum)
heart rate is in control, on Xarelto, off Diltiazem. Hgb 11.8 11/22/20, TSH 1.30 03/29/20 

## 2021-01-18 NOTE — Assessment & Plan Note (Signed)
symptoms improved, on Sinemet, Comtan, f/u neurology at [facility]. double vision, chronic, saw Ophthalmology

## 2021-01-22 ENCOUNTER — Encounter: Payer: Self-pay | Admitting: Nurse Practitioner

## 2021-01-29 ENCOUNTER — Encounter (HOSPITAL_BASED_OUTPATIENT_CLINIC_OR_DEPARTMENT_OTHER): Payer: Medicare PPO | Attending: Internal Medicine | Admitting: Internal Medicine

## 2021-01-29 ENCOUNTER — Other Ambulatory Visit: Payer: Self-pay

## 2021-01-29 DIAGNOSIS — I872 Venous insufficiency (chronic) (peripheral): Secondary | ICD-10-CM | POA: Insufficient documentation

## 2021-01-29 DIAGNOSIS — L602 Onychogryphosis: Secondary | ICD-10-CM | POA: Diagnosis not present

## 2021-01-29 DIAGNOSIS — G20 Parkinson's disease: Secondary | ICD-10-CM | POA: Insufficient documentation

## 2021-01-29 DIAGNOSIS — I4891 Unspecified atrial fibrillation: Secondary | ICD-10-CM | POA: Diagnosis not present

## 2021-01-29 DIAGNOSIS — M1990 Unspecified osteoarthritis, unspecified site: Secondary | ICD-10-CM | POA: Insufficient documentation

## 2021-01-29 DIAGNOSIS — L97822 Non-pressure chronic ulcer of other part of left lower leg with fat layer exposed: Secondary | ICD-10-CM | POA: Insufficient documentation

## 2021-01-29 DIAGNOSIS — I890 Lymphedema, not elsewhere classified: Secondary | ICD-10-CM | POA: Insufficient documentation

## 2021-01-29 DIAGNOSIS — L84 Corns and callosities: Secondary | ICD-10-CM | POA: Diagnosis not present

## 2021-01-29 NOTE — Progress Notes (Signed)
Stephanie Shaw ([id number]) Visit Report for 01/29/2021 Chief Complaint Document Details Patient Name: Date of Service: Stephanie Shaw 01/29/2021 1:15 PM Medical Record Number: [id number] Patient Account Number: [id number] Date of Birth/Sex: Treating RN: January 14, 1941 (81 y.o. Stephanie Shaw, Stephanie Shaw Primary Care Provider: Veleta Shaw Other Clinician: Referring Provider: Treating Provider/Extender: Stephanie Shaw in Treatment: 0 Information Obtained from: Patient Chief Complaint Left lower extremity wound following trauma Electronic Signature(s) Signed: 01/29/2021 3:11:03 PM By: Stephanie Shan DO Entered By: Stephanie Shaw on 01/29/2021 15:02:21 -------------------------------------------------------------------------------- Debridement Details Patient Name: Date of Service: Stephanie Shaw 01/29/2021 1:15 PM Medical Record Number: [id number] Patient Account Number: [id number] Date of Birth/Sex: Treating RN: 04-May-1940 (85 y.o. Stephanie Shaw, Stephanie Shaw Primary Care Provider: Veleta Shaw Other Clinician: Referring Provider: Treating Provider/Extender: Stephanie Shaw in Treatment: 0 Debridement Performed for Assessment: Wound #1 Left,Anterior Lower Leg Performed By: Physician Stephanie Shan, DO Debridement Type: Debridement Level of Consciousness (Pre-procedure): Awake and Alert Pre-procedure Verification/Time Out Yes - 14:40 Taken: Start Time: 14:41 Pain Control: Lidocaine 4% T opical Solution T Area Debrided (L x W): otal 1.8 (cm) x 1.6 (cm) = 2.88 (cm) Tissue and other material debrided: Viable, Non-Viable, Slough, Subcutaneous, Skin: Dermis , Skin: Epidermis, Fibrin/Exudate, Slough Level: Skin/Subcutaneous Tissue Debridement Description: Excisional Instrument: Curette Bleeding: Minimum Hemostasis Achieved: Pressure End Time: 14:46 Procedural Pain: 0 Post Procedural Pain: 0 Response to Treatment: Procedure was tolerated  well Level of Consciousness (Post- Awake and Alert procedure): Post Debridement Measurements of Total Wound Length: (cm) 1.8 Width: (cm) 1.6 Depth: (cm) 0.1 Volume: (cm) 0.226 Character of Wound/Ulcer Post Debridement: Improved Post Procedure Diagnosis Same as Pre-procedure Electronic Signature(s) Signed: 01/29/2021 3:11:03 PM By: Stephanie Shan DO Signed: 01/29/2021 5:43:46 PM By: Stephanie Gouty RN, BSN Entered By: Stephanie Shaw on 01/29/2021 14:46:52 -------------------------------------------------------------------------------- HPI Details Patient Name: Date of Service: Stephanie Shaw 01/29/2021 1:15 PM Medical Record Number: [id number] Patient Account Number: [id number] Date of Birth/Sex: Treating RN: 24-Aug-1940 (33 y.o. Stephanie Shaw Primary Care Provider: Veleta Shaw Other Clinician: Referring Provider: Treating Provider/Extender: Stephanie Shaw in Treatment: 0 History of Present Illness HPI Description: Admission 01/29/2021 Stephanie Shaw is an 81 year old female with a past medical history of A. fib, chronic venous insufficiency and Parkinson's that presents to the clinic for 4- 1/20-month history of nonhealing wound to the anterior aspect of the left lower extremity. She states that on September 10, 2020 her compression stockings were being replaced by the CNA at her facility and she developed a skin tear. She states that overall the wound is stable with slight improvement in wound healing for the past several months. She has been using Xeroform to the wound bed. She wears her compression stockings every other day. She currently denies signs of infection. Electronic Signature(s) Signed: 01/29/2021 3:11:03 PM By: Stephanie Shan DO Entered By: Stephanie Shaw on 01/29/2021 15:06:28 -------------------------------------------------------------------------------- Physical Exam Details Patient Name: Date of Service: Stephanie Shaw  01/29/2021 1:15 PM Medical Record Number: [id number] Patient Account Number: [id number] Date of Birth/Sex: Treating RN: 08-11-1940 (73 y.o. Stephanie Shaw Primary Care Provider: Veleta Shaw Other Clinician: Referring Provider: Treating Provider/Extender: Stephanie Shaw in Treatment: 0 Constitutional respirations regular, non-labored and within target range for patient.. Cardiovascular 2+ dorsalis pedis/posterior tibialis pulses. Psychiatric pleasant and cooperative. Notes Left lower extremity: T the anterior aspect there is a cluster of small wounds with nonviable tissue throughout each. No surrounding signs of infection. 2+ o  pitting edema to the knee. Varicose veins noted. Electronic Signature(s) Signed: 01/29/2021 3:11:03 PM By: Stephanie Shan DO Entered By: Stephanie Shaw on 01/29/2021 15:07:25 -------------------------------------------------------------------------------- Physician Orders Details Patient Name: Date of Service: Stephanie Shaw 01/29/2021 1:15 PM Medical Record Number: [id number] Patient Account Number: [id number] Date of Birth/Sex: Treating RN: [birth year] (68 y.o. Stephanie Shaw, Stephanie Shaw Primary Care Provider: Veleta Shaw Other Clinician: Referring Provider: Treating Provider/Extender: Stephanie Shaw in Treatment: 0 Verbal / Phone Orders: No Diagnosis Coding ICD-10 Coding Code Description [phone] Non-pressure chronic ulcer of other part of left lower leg with fat layer exposed I48.91 Unspecified atrial fibrillation G20 Parkinson's disease I87.2 Venous insufficiency (chronic) (peripheral) I89.0 Lymphedema, not elsewhere classified Follow-up Appointments ppointment in 1 week. - Dr. [place] Shaw Monday or Tuesday. Return A Bathing/ Shower/ Hygiene May shower with protection but do not get wound dressing(s) wet. - when not changing the dressing use a cast protector. May shower and wash wound with soap and  water. - Facility to remove compression wrap, wash wound with soap and water on Friday shower days. Edema Control - Lymphedema / SCD / Other Elevate legs to the level of the heart or above for 30 minutes daily and/or when sitting, a frequency of: - 3-4 times a throughout the day. Avoid standing for long periods of time. Patient to wear own compression stockings every day. - On RIGHT leg only. Apply in the morning and remove at night. Exercise regularly Moisturize legs daily. - right leg every night before bed. Wound Treatment Wound #1 - Lower Leg Wound Laterality: Left, Anterior Cleanser: Soap and Water 2 x Per Week/30 Days Discharge Instructions: May shower and wash wound with dial antibacterial soap and water prior to dressing change. Cleanser: Wound Cleanser 2 x Per Week/30 Days Discharge Instructions: Cleanse the wound with wound cleanser prior to applying a clean dressing using gauze sponges, not tissue or cotton balls. Peri-Wound Care: Sween Lotion (Moisturizing lotion) 2 x Per Week/30 Days Discharge Instructions: Apply moisturizing lotion as directed Topical: Gentamicin 2 x Per Week/30 Days Discharge Instructions: Apply directly to wound bed under the primary dressing. Prim Dressing: Hydrofera Blue Ready Foam, 2.5 x2.5 in 2 x Per Week/30 Days ary Discharge Instructions: Apply to wound bed over the gentamicin ointment. Secondary Dressing: ABD Pad, 8x10 2 x Per Week/30 Days Discharge Instructions: Apply over primary dressing as directed. Compression Wrap: ThreePress (3 layer compression wrap) 2 x Per Week/30 Days Discharge Instructions: Apply three layer compression as directed. Electronic Signature(s) Signed: 01/29/2021 3:11:03 PM By: Stephanie Shan DO Entered By: Stephanie Shaw on 01/29/2021 15:07:47 -------------------------------------------------------------------------------- Problem List Details Patient Name: Date of Service: Stephanie Shaw 01/29/2021 1:15  PM Medical Record Number: [id number] Patient Account Number: [id number] Date of Birth/Sex: Treating RN: 1940/12/03 (59 y.o. 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/10/2021 No Yes I87.2 Venous insufficiency (chronic) (peripheral) 01/29/2021 No Yes I89.0 Lymphedema, not elsewhere classified 01/29/2021 No Yes G20 Parkinson's disease 01/29/2021 No Yes I48.91 Unspecified atrial fibrillation 01/29/2021 No Yes Inactive Problems Resolved Problems Electronic Signature(s) Signed: 01/29/2021 3:11:03 PM By: Stephanie Shan DO Entered By: Stephanie Shaw on 01/29/2021 15:01:43 -------------------------------------------------------------------------------- Progress Note Details Patient Name: Date of Service: Stephanie Shaw 01/29/2021 1:15 PM Medical Record Number: [id number] Patient Account Number: [id number]  Date of Birth/Sex: Treating RN: 04/17/1940 (68 y.o. Stephanie Shaw Primary Care Provider: Veleta Shaw Other Clinician: Referring Provider: Treating Provider/Extender: Stephanie Shaw in Treatment: 0 Subjective Chief Complaint Information obtained from Patient Left lower extremity wound following trauma History of Present Illness (HPI) Admission 01/29/2021 Ms. Ragen Laver is an 81 year old female with a past medical history of A. fib, chronic venous insufficiency and Parkinson's that presents to the clinic for 4- 1/101-month history of nonhealing wound to the anterior aspect of the left lower extremity. She states that on September 10, 2020 her compression stockings were being replaced by the CNA at her facility and she developed a skin tear. She states that overall  the wound is stable with slight improvement in wound healing for the past several months. She has been using Xeroform to the wound bed. She wears her compression stockings every other day. She currently denies signs of infection. Patient History Information obtained from Patient, Chart. Allergies doxycycline, Sulfa (Sulfonamide Antibiotics) (Severity: Severe, Reaction: HIVES) Family History Cancer - Mother, Diabetes - Mother, Heart Disease - Mother, Hypertension - Mother, Kidney Disease - Father, Stroke - Siblings, No family history of Hereditary Spherocytosis, Lung Disease, Seizures, Thyroid Problems, Tuberculosis. Social History Never smoker, Marital Status - Divorced, Alcohol Use - Never, Drug Use - No History, Caffeine Use - Never. Medical History Eyes Patient has history of Cataracts - removed Denies history of Glaucoma, Optic Neuritis Ear/Nose/Mouth/Throat Denies history of Chronic sinus problems/congestion, Middle ear problems Hematologic/Lymphatic Patient has history of Anemia Denies history of Hemophilia, Human Immunodeficiency Virus, Lymphedema, Sickle Cell Disease Respiratory Denies history of Aspiration, Asthma, Chronic Obstructive Pulmonary Disease (COPD), Pneumothorax, Sleep Apnea, Tuberculosis Cardiovascular Patient has history of Arrhythmia - A. Fib, Hypotension, Peripheral Venous Disease Denies history of Angina, Congestive Heart Failure, Coronary Artery Disease, Deep Vein Thrombosis, Hypertension, Myocardial Infarction, Peripheral Arterial Disease, Phlebitis, Vasculitis Gastrointestinal Denies history of Cirrhosis , Colitis, Crohnoos, Hepatitis A, Hepatitis B, Hepatitis C Endocrine Denies history of Type I Diabetes, Type II Diabetes Genitourinary Denies history of End Stage Renal Disease Immunological Denies history of Lupus Erythematosus, Raynaudoos, Scleroderma Integumentary (Skin) Denies history of History of Burn Musculoskeletal Patient has history of  Osteoarthritis - hips and knees Denies history of Gout, Rheumatoid Arthritis, Osteomyelitis Neurologic Denies history of Dementia, Neuropathy, Quadriplegia, Paraplegia, Seizure Disorder Oncologic Denies history of Received Chemotherapy, Received Radiation Psychiatric Denies history of Anorexia/bulimia, Confinement Anxiety Hospitalization/Surgery History - Left breast biopsy. Medical A Surgical History Notes nd Genitourinary Frequent UTI Immunological Parkinson's Disease Musculoskeletal DJD Review of Systems (ROS) Constitutional Symptoms (General Health) Denies complaints or symptoms of Fatigue, Fever, Chills, Marked Weight Change. Eyes Complains or has symptoms of Glasses / Contacts - glasses. Denies complaints or symptoms of Dry Eyes, Vision Changes. Ear/Nose/Mouth/Throat Denies complaints or symptoms of Chronic sinus problems or rhinitis. Gastrointestinal Denies complaints or symptoms of Frequent diarrhea, Nausea, Vomiting. Endocrine Denies complaints or symptoms of Heat/cold intolerance. Genitourinary Denies complaints or symptoms of Frequent urination. Integumentary (Skin) Complains or has symptoms of Wounds - left leg. Musculoskeletal Denies complaints or symptoms of Muscle Pain, Muscle Weakness. Neurologic Denies complaints or symptoms of Numbness/parasthesias. Psychiatric Denies complaints or symptoms of Claustrophobia, Suicidal. Objective Constitutional respirations regular, non-labored and within target range for patient.. Vitals Time Taken: 1:32 PM, Height: 68 in, Source: Stated, Weight: 132 lbs, Source: Stated, BMI: 20.1, Temperature: 97.4 F, Pulse: 79 bpm, Respiratory Rate: 18 breaths/min, Blood Pressure: 133/70 mmHg. Cardiovascular 2+ dorsalis pedis/posterior tibialis pulses. Psychiatric pleasant and cooperative. General Notes: Left  lower extremity: T the anterior aspect there is a cluster of small wounds with nonviable tissue throughout each. No  surrounding signs of o infection. 2+ pitting edema to the knee. Varicose veins noted. Integumentary (Hair, Skin) Wound #1 status is Open. Original cause of wound was Gradually Appeared. The date acquired was: 08/20/2020. The wound is located on the Left,Anterior Lower Leg. The wound measures 1.8cm length x 1.6cm width x 0.1cm depth; 2.262cm^2 area and 0.226cm^3 volume. There is Fat Layer (Subcutaneous Tissue) exposed. There is no tunneling or undermining noted. There is a medium amount of serosanguineous drainage noted. The wound margin is distinct with the outline attached to the wound base. There is medium (34-66%) red, pink granulation within the wound bed. There is a medium (34-66%) amount of necrotic tissue within the wound bed including Adherent Slough. Assessment Active Problems ICD-10 Non-pressure chronic ulcer of other part of left lower leg with fat layer exposed Venous insufficiency (chronic) (peripheral) Lymphedema, not elsewhere classified Parkinson's disease Unspecified atrial fibrillation Patient presents with a 4-1/17-month history of nonhealing chronic ulcer to the left lower extremity following trauma. Her ABIs in office were 1.01 on the left and she has strong bounding pedal pulses. Patient's daughter is on the phone to help provide history and also go over the plan. There were no signs of infection on exam. I debrided nonviable tissue. I recommended compression therapy due to her chronic venous insufficiency along with Hydrofera Blue and gentamicin to address any bioburden. Patient and daughter were agreeable with the plan. She resides in a nursing facility and they can do the wrap change once a week. We will send them orders. Patient will follow-up in 1 week. 47 minutes was spent on the encounter including face-to-face, EMR review and coordination of care. Procedures Wound #1 Pre-procedure diagnosis of Wound #1 is a Lymphedema located on the Left,Anterior Lower Leg . There  was a Excisional Skin/Subcutaneous Tissue Debridement with a total area of 2.88 sq cm performed by Stephanie Shan, DO. With the following instrument(s): Curette to remove Viable and Non-Viable tissue/material. Material removed includes Subcutaneous Tissue, Slough, Skin: Dermis, Skin: Epidermis, and Fibrin/Exudate after achieving pain control using Lidocaine 4% T opical Solution. A time out was conducted at 14:40, prior to the start of the procedure. A Minimum amount of bleeding was controlled with Pressure. The procedure was tolerated well with a pain level of 0 throughout and a pain level of 0 following the procedure. Post Debridement Measurements: 1.8cm length x 1.6cm width x 0.1cm depth; 0.226cm^3 volume. Character of Wound/Ulcer Post Debridement is improved. Post procedure Diagnosis Wound #1: Same as Pre-Procedure Pre-procedure diagnosis of Wound #1 is a Lymphedema located on the Left,Anterior Lower Leg . There was a Three Layer Compression Therapy Procedure by Stephanie Gouty, RN. Post procedure Diagnosis Wound #1: Same as Pre-Procedure Plan Follow-up Appointments: Return Appointment in 1 week. - Dr. Heber Haslet Monday or Tuesday. Bathing/ Shower/ Hygiene: May shower with protection but do not get wound dressing(s) wet. - when not changing the dressing use a cast protector. May shower and wash wound with soap and water. - Facility to remove compression wrap, wash wound with soap and water on Friday shower days. Edema Control - Lymphedema / SCD / Other: Elevate legs to the level of the heart or above for 30 minutes daily and/or when sitting, a frequency of: - 3-4 times a throughout the day. Avoid standing for long periods of time. Patient to wear own compression stockings every day. - On RIGHT leg  only. Apply in the morning and remove at night. Exercise regularly Moisturize legs daily. - right leg every night before bed. WOUND #1: - Lower Leg Wound Laterality: Left, Anterior Cleanser: Soap  and Water 2 x Per Week/30 Days Discharge Instructions: May shower and wash wound with dial antibacterial soap and water prior to dressing change. Cleanser: Wound Cleanser 2 x Per Week/30 Days Discharge Instructions: Cleanse the wound with wound cleanser prior to applying a clean dressing using gauze sponges, not tissue or cotton balls. Peri-Wound Care: Sween Lotion (Moisturizing lotion) 2 x Per Week/30 Days Discharge Instructions: Apply moisturizing lotion as directed Topical: Gentamicin 2 x Per Week/30 Days Discharge Instructions: Apply directly to wound bed under the primary dressing. Prim Dressing: Hydrofera Blue Ready Foam, 2.5 x2.5 in 2 x Per Week/30 Days ary Discharge Instructions: Apply to wound bed over the gentamicin ointment. Secondary Dressing: ABD Pad, 8x10 2 x Per Week/30 Days Discharge Instructions: Apply over primary dressing as directed. Com pression Wrap: ThreePress (3 layer compression wrap) 2 x Per Week/30 Days Discharge Instructions: Apply three layer compression as directed. 1. In office sharp debridement 2. Hydrofera Blue, gentamicin under 3 layer compression 3. Follow-up in 1 week 4. Order sent to nursing facility. They will change the wrap once a week as well. Electronic Signature(s) Signed: 01/29/2021 3:11:03 PM By: Stephanie Shan DO Entered By: Stephanie Shaw on 01/29/2021 15:10:13 -------------------------------------------------------------------------------- HxROS Details Patient Name: Date of Service: Stephanie Shaw 01/29/2021 1:15 PM Medical Record Number: [id number] Patient Account Number: [id number] Date of Birth/Sex: Treating RN: [birth year] (81 y.o. Elam Dutch Primary Care Provider: Veleta Shaw Other Clinician: Referring Provider: Treating Provider/Extender: Stephanie Shaw in Treatment: 0 Information Obtained From Patient Chart Constitutional Symptoms (General Health) Complaints and Symptoms: Negative for:  Fatigue; Fever; Chills; Marked Weight Change Eyes Complaints and Symptoms: Positive for: Glasses / Contacts - glasses Negative for: Dry Eyes; Vision Changes Medical History: Positive for: Cataracts - removed Negative for: Glaucoma; Optic Neuritis Ear/Nose/Mouth/Throat Complaints and Symptoms: Negative for: Chronic sinus problems or rhinitis Medical History: Negative for: Chronic sinus problems/congestion; Middle ear problems Gastrointestinal Complaints and Symptoms: Negative for: Frequent diarrhea; Nausea; Vomiting Medical History: Negative for: Cirrhosis ; Colitis; Crohns; Hepatitis A; Hepatitis B; Hepatitis C Endocrine Complaints and Symptoms: Negative for: Heat/cold intolerance Medical History: Negative for: Type I Diabetes; Type II Diabetes Genitourinary Complaints and Symptoms: Negative for: Frequent urination Medical History: Negative for: End Stage Renal Disease Past Medical History Notes: Frequent UTI Integumentary (Skin) Complaints and Symptoms: Positive for: Wounds - left leg Medical History: Negative for: History of Burn Musculoskeletal Complaints and Symptoms: Negative for: Muscle Pain; Muscle Weakness Medical History: Positive for: Osteoarthritis - hips and knees Negative for: Gout; Rheumatoid Arthritis; Osteomyelitis Past Medical History Notes: DJD Neurologic Complaints and Symptoms: Negative for: Numbness/parasthesias Medical History: Negative for: Dementia; Neuropathy; Quadriplegia; Paraplegia; Seizure Disorder Psychiatric Complaints and Symptoms: Negative for: Claustrophobia; Suicidal Medical History: Negative for: Anorexia/bulimia; Confinement Anxiety Hematologic/Lymphatic Medical History: Positive for: Anemia Negative for: Hemophilia; Human Immunodeficiency Virus; Lymphedema; Sickle Cell Disease Respiratory Medical History: Negative for: Aspiration; Asthma; Chronic Obstructive Pulmonary Disease (COPD); Pneumothorax; Sleep Apnea;  Tuberculosis Cardiovascular Medical History: Positive for: Arrhythmia - A. Fib; Hypotension; Peripheral Venous Disease Negative for: Angina; Congestive Heart Failure; Coronary Artery Disease; Deep Vein Thrombosis; Hypertension; Myocardial Infarction; Peripheral Arterial Disease; Phlebitis; Vasculitis Immunological Medical History: Negative for: Lupus Erythematosus; Raynauds; Scleroderma Past Medical History Notes: Parkinson's Disease Oncologic Medical History: Negative for: Received Chemotherapy; Received Radiation HBO Extended History Items  Eyes: Cataracts Immunizations Pneumococcal Vaccine: Received Pneumococcal Vaccination: Yes Received Pneumococcal Vaccination On or After 60th Birthday: Yes Implantable Devices No devices added Hospitalization / Surgery History Type of Hospitalization/Surgery Left breast biopsy Family and Social History Cancer: Yes - Mother; Diabetes: Yes - Mother; Heart Disease: Yes - Mother; Hereditary Spherocytosis: No; Hypertension: Yes - Mother; Kidney Disease: Yes - Father; Lung Disease: No; Seizures: No; Stroke: Yes - Siblings; Thyroid Problems: No; Tuberculosis: No; Never smoker; Marital Status - Divorced; Alcohol Use: Never; Drug Use: No History; Caffeine Use: Never; Financial Concerns: No; Food, Clothing or Shelter Needs: No; Support System Lacking: No; Transportation Concerns: No Electronic Signature(s) Signed: 01/29/2021 3:11:03 PM By: Stephanie Shan DO Signed: 01/29/2021 5:43:46 PM By: Stephanie Gouty RN, BSN Entered By: Stephanie Shaw on 01/29/2021 14:02:34 -------------------------------------------------------------------------------- [place] Details Patient Name: Date of Service: Stephanie Shaw 01/29/2021 Medical Record Number: [id number] Patient Account Number: [id number] Date of Birth/Sex: Treating RN: [birth year] (60 y.o. Stephanie Shaw, Stephanie Shaw Primary Care Provider: Veleta Shaw Other Clinician: Referring Provider: Treating  Provider/Extender: Stephanie Shaw in Treatment: 0 Diagnosis Coding ICD-10 Codes Code Description [phone] Non-pressure chronic ulcer of other part of left lower leg with fat layer exposed I48.91 Unspecified atrial fibrillation G20 Parkinson's disease I87.2 Venous insufficiency (chronic) (peripheral) I89.0 Lymphedema, not elsewhere classified Facility Procedures CPT4 Code: [id number] Description: 67893 - WOUND CARE VISIT-LEV 4 EST PT Modifier: Quantity: 1 CPT4 Code: [id number] Description: 25852 - DEB SUBQ TISSUE 20 SQ CM/< ICD-10 Diagnosis Description L97.822 Non-pressure chronic ulcer of other part of left lower leg with fat layer expos Modifier: ed Quantity: 1 Physician Procedures : CPT4 Code Description Modifier [id number] 53614 - WC PHYS LEVEL 4 - NEW PT ICD-10 Diagnosis Description L97.822 Non-pressure chronic ulcer of other part of left lower leg with fat layer exposed I87.2 Venous insufficiency (chronic) (peripheral) I89.0  Lymphedema, not elsewhere classified G20 Parkinson's disease Quantity: 1 : [id number] 86761 - WC PHYS SUBQ TISS 20 SQ CM ICD-10 Diagnosis Description L97.822 Non-pressure chronic ulcer of other part of left lower leg with fat layer exposed Quantity: 1 Electronic Signature(s) Signed: 01/29/2021 3:11:03 PM By: Stephanie Shan DO Entered By: Stephanie Shaw on 01/29/2021 15:10:42

## 2021-01-29 NOTE — Progress Notes (Signed)
Stephanie, Shaw ([id number]) Visit Report for 01/29/2021 Allergy List Details Patient Name: Date of Service: Stephanie Shaw, Stephanie Shaw 01/29/2021 1:15 PM Medical Record Number: [id number] Patient Account Number: [id number] Date of Birth/Sex: Treating RN: 22-Dec-1940 (81 y.o. Stephanie Shaw Primary Care Yoshio Seliga: Stephanie Shaw Other Clinician: Referring Arlanda Shiplett: Treating Irini Leet/Extender: Kathie Rhodes Weeks in Treatment: 0 Allergies Active Allergies doxycycline Sulfa (Sulfonamide Antibiotics) Reaction: HIVES Severity: Severe Allergy Notes Electronic Signature(s) Signed: 01/29/2021 5:43:46 PM By: Baruch Gouty RN, BSN Entered By: Baruch Gouty on 01/29/2021 13:55:18 -------------------------------------------------------------------------------- Arrival Information Details Patient Name: Date of Service: Stephanie Shaw 01/29/2021 1:15 PM Medical Record Number: [id number] Patient Account Number: [id number] Date of Birth/Sex: Treating RN: 1940-07-03 (37 y.o. Stephanie Shaw Primary Care Denali Sharma: Stephanie Shaw Other Clinician: Referring Karim Aiello: Treating Stephanie Shaw/Extender: Stephanie Shaw in Treatment: 0 Visit Information Patient Arrived: Wheel Chair Arrival Time: 13:23 Accompanied By: facility staff Transfer Assistance: Manual Patient Identification Verified: Yes Secondary Verification Process Completed: Yes Patient Requires Transmission-Based Precautions: No Patient Has Alerts: No Electronic Signature(s) Signed: 01/29/2021 5:43:46 PM By: Baruch Gouty RN, BSN Entered By: Baruch Gouty on 01/29/2021 13:29:49 -------------------------------------------------------------------------------- Clinic Level of Care Assessment Details Patient Name: Date of Service: Stephanie Shaw, Stephanie Shaw 01/29/2021 1:15 PM Medical Record Number: [id number] Patient Account Number: [id number] Date of Birth/Sex: Treating RN: 02-11-1940 (71 y.o. Stephanie Shaw Primary Care Stephanie Shaw: Stephanie Shaw Other Clinician: Referring Stephanie Shaw: Treating Stephanie Shaw/Extender: Stephanie Shaw in Treatment: 0 Clinic Level of Care Assessment Items TOOL 1 Quantity Score X- 1 0 Use when EandM and Procedure is performed on INITIAL visit ASSESSMENTS - Nursing Assessment / Reassessment X- 1 20 General Physical Exam (combine w/ comprehensive assessment (listed just below) when performed on new pt. evals) X- 1 25 Comprehensive Assessment (HX, ROS, Risk Assessments, Wounds Hx, etc.) ASSESSMENTS - Wound and Skin Assessment / Reassessment X- 1 10 Dermatologic / Skin Assessment (not related to wound area) ASSESSMENTS - Ostomy and/or Continence Assessment and Care '[]'  - 0 Incontinence Assessment and Management '[]'  - 0 Ostomy Care Assessment and Management (repouching, etc.) PROCESS - Coordination of Care X - Simple Patient / Family Education for ongoing care 1 15 '[]'  - 0 Complex (extensive) Patient / Family Education for ongoing care X- 1 10 Staff obtains Programmer, systems, Records, T Results / Process Orders est X- 1 10 Staff telephones HHA, Nursing Homes / Clarify orders / etc '[]'  - 0 Routine Transfer to another Facility (non-emergent condition) '[]'  - 0 Routine Hospital Admission (non-emergent condition) X- 1 15 New Admissions / Biomedical engineer / Ordering NPWT Apligraf, etc. , '[]'  - 0 Emergency Hospital Admission (emergent condition) PROCESS - Special Needs '[]'  - 0 Pediatric / Minor Patient Management '[]'  - 0 Isolation Patient Management '[]'  - 0 Hearing / Language / Visual special needs '[]'  - 0 Assessment of Community assistance (transportation, D/C planning, etc.) '[]'  - 0 Additional assistance / Altered mentation '[]'  - 0 Support Surface(s) Assessment (bed, cushion, seat, etc.) INTERVENTIONS - Miscellaneous '[]'  - 0 External ear exam '[]'  - 0 Patient Transfer (multiple staff / Civil Service fast streamer / Similar devices) '[]'  - 0 Simple Staple /  Suture removal (25 or less) '[]'  - 0 Complex Staple / Suture removal (26 or more) '[]'  - 0 Hypo/Hyperglycemic Management (do not check if billed separately) X- 1 15 Ankle / Brachial Index (ABI) - do not check if billed separately Has the patient been seen at the hospital within the last three years: Yes Total Score:  120 Level Of Care: New/Established - Level 4 Electronic Signature(s) Signed: 01/29/2021 5:43:46 PM By: Baruch Gouty RN, BSN Signed: 01/29/2021 5:43:46 PM By: Baruch Gouty RN, BSN Entered By: Baruch Gouty on 01/29/2021 14:47:46 -------------------------------------------------------------------------------- Compression Therapy Details Patient Name: Date of Service: Stephanie Shaw 01/29/2021 1:15 PM Medical Record Number: [id number] Patient Account Number: [id number] Date of Birth/Sex: Treating RN: 10-13-1940 (38 y.o. Stephanie Shaw Primary Care Stephanie Shaw: Stephanie Shaw Other Clinician: Referring Stephanie Shaw: Treating Stephanie Shaw/Extender: Kathie Rhodes Weeks in Treatment: 0 Compression Therapy Performed for Wound Assessment: Wound #1 Left,Anterior Lower Leg Performed By: Clinician Baruch Gouty, RN Compression Type: Three Layer Post Procedure Diagnosis Same as Pre-procedure Electronic Signature(s) Signed: 01/29/2021 5:43:46 PM By: Baruch Gouty RN, BSN Entered By: Baruch Gouty on 01/29/2021 14:47:22 -------------------------------------------------------------------------------- Encounter Discharge Information Details Patient Name: Date of Service: Stephanie Shaw 01/29/2021 1:15 PM Medical Record Number: [id number] Patient Account Number: [id number] Date of Birth/Sex: Treating RN: 1940-08-26 (11 y.o. Stephanie Shaw Primary Care Erbie Arment: Stephanie Shaw Other Clinician: Referring Stephanie Shaw: Treating Stephanie Shaw/Extender: Stephanie Shaw in Treatment: 0 Encounter Discharge Information Items Post Procedure  Vitals Discharge Condition: Stable Temperature (F): 97.4 Ambulatory Status: Wheelchair Pulse (bpm): 79 Discharge Destination: Home Respiratory Rate (breaths/min): 18 Transportation: Private Auto Blood Pressure (mmHg): 133/70 Accompanied By: self Schedule Follow-up Appointment: Yes Clinical Summary of Care: Electronic Signature(s) Signed: 01/29/2021 5:43:46 PM By: Baruch Gouty RN, BSN Entered By: Baruch Gouty on 01/29/2021 14:49:33 -------------------------------------------------------------------------------- Lower Extremity Assessment Details Patient Name: Date of Service: Stephanie Shaw 01/29/2021 1:15 PM Medical Record Number: [id number] Patient Account Number: [id number] Date of Birth/Sex: Treating RN: 05/31/1940 (81 y.o. [name], [name] Primary Care Gurveer Colucci: Stephanie Shaw Other Clinician: Referring Erma Raiche: Treating Stanisha Lorenz/Extender: Kathie Rhodes Weeks in Treatment: 0 Edema Assessment Assessed: Shirlyn Goltz: Yes] [Right: No] Edema: [Left: Ye] [Right: s] Calf Left: Right: Point of Measurement: 30 cm From Medial Instep 35.5 cm Ankle Left: Right: Point of Measurement: 10 cm From Medial Instep 23 cm Knee To Floor Left: Right: From Medial Instep 45 cm Vascular Assessment Pulses: Dorsalis Pedis Palpable: [Left:Yes] Doppler Audible: [Left:Yes] Posterior Tibial Palpable: [Left:Yes] Doppler Audible: [Left:Yes] Blood Pressure: Brachial: [Left:133] Ankle: [Left:Dorsalis Pedis: 134 1.01] Electronic Signature(s) Signed: 01/29/2021 5:43:46 PM By: Baruch Gouty RN, BSN Entered By: Baruch Gouty on 01/29/2021 14:15:02 -------------------------------------------------------------------------------- Multi Wound Chart Details Patient Name: Date of Service: Stephanie Shaw 01/29/2021 1:15 PM Medical Record Number: [id number] Patient Account Number: [id number] Date of Birth/Sex: Treating RN: Jan 13, 1941 (81 y.o. Helene Shoe, Tammi Klippel Primary Care  Camree Wigington: Stephanie Shaw Other Clinician: Referring Amnah Breuer: Treating Luccas Towell/Extender: Kathie Rhodes Weeks in Treatment: 0 Vital Signs Height(in): 68 Pulse(bpm): 79 Weight(lbs): 132 Blood Pressure(mmHg): 133/70 Body Mass Index(BMI): 20 Temperature(F): 97.4 Respiratory Rate(breaths/min): 18 Photos: [1:Left, Anterior Lower Leg] [N/A:N/A N/A] Wound Location: [1:Gradually Appeared] [N/A:N/A] Wounding Event: [1:Lymphedema] [N/A:N/A] Primary Etiology: [1:Cataracts, Anemia, Arrhythmia,] [N/A:N/A] Comorbid History: [1:Hypotension, Peripheral Venous Disease, Osteoarthritis 08/20/2020] [N/A:N/A] Date Acquired: [1:0] [N/A:N/A] Weeks of Treatment: [1:Open] [N/A:N/A] Wound Status: [1:Yes] [N/A:N/A] Clustered Wound: [1:4] [N/A:N/A] Clustered Quantity: [1:1.8x1.6x0.1] [N/A:N/A] Measurements L x W x D (cm) [1:2.262] [N/A:N/A] A (cm) : rea [1:0.226] [N/A:N/A] Volume (cm) : [1:0.00%] [N/A:N/A] % Reduction in A [1:rea: 0.00%] [N/A:N/A] % Reduction in Volume: [1:Full Thickness Without Exposed] [N/A:N/A] Classification: [1:Support Structures Medium] [N/A:N/A] Exudate A mount: [1:Serosanguineous] [N/A:N/A] Exudate Type: [1:red, brown] [N/A:N/A] Exudate Color: [1:Distinct, outline attached] [N/A:N/A] Wound Margin: [1:Medium (34-66%)] [N/A:N/A] Granulation A mount: [1:Red, Pink] [N/A:N/A] Granulation Quality: [1:Medium (34-66%)] [N/A:N/A]  Necrotic A mount: [1:Fat Layer (Subcutaneous Tissue): Yes N/A] Exposed Structures: [1:Fascia: No Tendon: No Muscle: No Joint: No Bone: No Small (1-33%)] [N/A:N/A] Epithelialization: [1:Debridement - Excisional] [N/A:N/A] Debridement: Pre-procedure Verification/Time Out 14:40 [N/A:N/A] Taken: [1:Lidocaine 4% Topical Solution] [N/A:N/A] Pain Control: [1:Subcutaneous, Slough] [N/A:N/A] Tissue Debrided: [1:Skin/Subcutaneous Tissue] [N/A:N/A] Level: [1:2.88] [N/A:N/A] Debridement A (sq cm): [1:rea Curette] [N/A:N/A] Instrument: [1:Minimum]  [N/A:N/A] Bleeding: [1:Pressure] [N/A:N/A] Hemostasis A chieved: [1:0] [N/A:N/A] Procedural Pain: [1:0] [N/A:N/A] Post Procedural Pain: [1:Procedure was tolerated well] [N/A:N/A] Debridement Treatment Response: [1:1.8x1.6x0.1] [N/A:N/A] Post Debridement Measurements L x W x D (cm) [1:0.226] [N/A:N/A] Post Debridement Volume: (cm) [1:Compression Therapy] [N/A:N/A] Procedures Performed: [1:Debridement] Treatment Notes Wound #1 (Lower Leg) Wound Laterality: Left, Anterior Cleanser Soap and Water Discharge Instruction: May shower and wash wound with dial antibacterial soap and water prior to dressing change. Wound Cleanser Discharge Instruction: Cleanse the wound with wound cleanser prior to applying a clean dressing using gauze sponges, not tissue or cotton balls. Peri-Wound Care Sween Lotion (Moisturizing lotion) Discharge Instruction: Apply moisturizing lotion as directed Topical Gentamicin Discharge Instruction: Apply directly to wound bed under the primary dressing. Primary Dressing Hydrofera Blue Ready Foam, 2.5 x2.5 in Discharge Instruction: Apply to wound bed over the gentamicin ointment. Secondary Dressing ABD Pad, 8x10 Discharge Instruction: Apply over primary dressing as directed. Secured With Compression Wrap ThreePress (3 layer compression wrap) Discharge Instruction: Apply three layer compression as directed. Compression Stockings Add-Ons Electronic Signature(s) Signed: 01/29/2021 3:11:03 PM By: Kalman Shan DO Signed: 01/29/2021 5:52:30 PM By: Deon Pilling RN, BSN Entered By: Kalman Shan on 01/29/2021 15:01:54 -------------------------------------------------------------------------------- Multi-Disciplinary Care Plan Details Patient Name: Date of Service: Stephanie Shaw 01/29/2021 1:15 PM Medical Record Number: [id number] Patient Account Number: [id number] Date of Birth/Sex: Treating RN: March 21, 1940 (81 y.o. Stephanie Shaw Primary Care  Keygan Dumond: Stephanie Shaw Other Clinician: Referring Tymara Saur: Treating Oretha Weismann/Extender: Kathie Rhodes Weeks in Treatment: 0 Active Inactive Abuse / Safety / Falls / Self Care Management Nursing Diagnoses: Potential for falls Goals: Patient will remain injury Shaw related to falls Date Initiated: 01/29/2021 Target Resolution Date: 02/22/2021 Goal Status: Active Interventions: Assess self care needs on admission and as needed Provide education on fall prevention Notes: Orientation to the Wound Care Program Nursing Diagnoses: Knowledge deficit related to the wound healing center program Goals: Patient/caregiver will verbalize understanding of the [facility] Date Initiated: 01/29/2021 Target Resolution Date: 02/21/2021 Goal Status: Active Interventions: Provide education on orientation to the wound center Notes: Pain, Acute or Chronic Nursing Diagnoses: Pain, acute or chronic: actual or potential Potential alteration in comfort, pain Goals: Patient will verbalize adequate pain control and receive pain control interventions during procedures as needed Date Initiated: 01/29/2021 Target Resolution Date: 02/22/2021 Goal Status: Active Patient/caregiver will verbalize comfort level met Date Initiated: 01/29/2021 Target Resolution Date: 02/21/2021 Goal Status: Active Interventions: Encourage patient to take pain medications as prescribed Provide education on pain management Reposition patient for comfort Treatment Activities: Administer pain control measures as ordered : 01/29/2021 Notes: Wound/Skin Impairment Nursing Diagnoses: Knowledge deficit related to ulceration/compromised skin integrity Goals: Patient/caregiver will verbalize understanding of skin care regimen Date Initiated: 01/29/2021 Target Resolution Date: 02/21/2021 Goal Status: Active Interventions: Assess patient/caregiver ability to perform ulcer/skin care regimen upon admission and  as needed Assess ulceration(s) every visit Provide education on ulcer and skin care Treatment Activities: Skin care regimen initiated : 01/29/2021 Topical wound management initiated : 01/29/2021 Notes: Electronic Signature(s) Signed: 01/29/2021 5:43:46 PM By: Baruch Gouty RN, BSN Entered By: Baruch Gouty  on 01/29/2021 14:24:13 -------------------------------------------------------------------------------- Pain Assessment Details Patient Name: Date of Service: Stephanie Shaw, Stephanie Shaw 01/29/2021 1:15 PM Medical Record Number: [id number] Patient Account Number: [id number] Date of Birth/Sex: Treating RN: Aug 14, 1940 (7 y.o. Stephanie Shaw Primary Care Tamarah Bhullar: Stephanie Shaw Other Clinician: Referring Julienne Vogler: Treating Jadis Mika/Extender: Kathie Rhodes Weeks in Treatment: 0 Active Problems Location of Pain Severity and Description of Pain Patient Has Paino No Site Locations Rate the pain. Current Pain Level: 0 Pain Management and Medication Current Pain Management: Medication: No Cold Application: No Rest: No Massage: No Activity: No T.E.N.S.: No Heat Application: No Leg drop or elevation: No Is the Current Pain Management Adequate: Adequate How does your wound impact your activities of daily livingo Sleep: No Bathing: No Appetite: No Relationship With Others: No Bladder Continence: No Emotions: No Bowel Continence: No Work: No Toileting: No Drive: No Dressing: No Hobbies: No Electronic Signature(s) Signed: 01/29/2021 5:43:46 PM By: Baruch Gouty RN, BSN Entered By: Baruch Gouty on 01/29/2021 14:04:24 -------------------------------------------------------------------------------- Patient/Caregiver Education Details Patient Name: Date of Service: Stephanie Shaw 1/10/2023andnbsp1:15 PM Medical Record Number: [id number] Patient Account Number: [id number] Date of Birth/Gender: Treating RN: Oct 22, 1940 (1 y.o. Stephanie Shaw Primary Care Physician: Stephanie Shaw Other Clinician: Referring Physician: Treating Physician/Extender: Stephanie Shaw in Treatment: 0 Education Assessment Education Provided To: Patient and Caregiver daughter and transport Education Topics Provided Mason: o Handouts: Welcome T [facility] [place] o Methods: Explain/Verbal, Printed Responses: Reinforcements needed Wound/Skin Impairment: Handouts: Caring for Your Ulcer, Skin Care Do's and Dont's Methods: Explain/Verbal, Printed Responses: Reinforcements needed Electronic Signature(s) Signed: 01/29/2021 5:43:46 PM By: Baruch Gouty RN, BSN Entered By: Baruch Gouty on 01/29/2021 14:33:16 -------------------------------------------------------------------------------- Wound Assessment Details Patient Name: Date of Service: Stephanie Shaw 01/29/2021 1:15 PM Medical Record Number: [id number] Patient Account Number: [id number] Date of Birth/Sex: Treating RN: [birth year] (81 y.o. [name], [name] Primary Care Parisa Pinela: Stephanie Shaw Other Clinician: Referring Mieczyslaw Stamas: Treating Lizzeth Meder/Extender: Kathie Rhodes Weeks in Treatment: 0 Wound Status Wound Number: 1 Primary Lymphedema Etiology: Wound Location: Left, Anterior Lower Leg Wound Open Wounding Event: Gradually Appeared Status: Date Acquired: 08/20/2020 Comorbid Cataracts, Anemia, Arrhythmia, Hypotension, Peripheral Venous Weeks Of Treatment: 0 History: Disease, Osteoarthritis Clustered Wound: Yes Photos Wound Measurements Length: (cm) 1.8 Width: (cm) 1.6 Depth: (cm) 0.1 Clustered Quantity: 4 Area: (cm) 2.262 Volume: (cm) 0.226 % Reduction in Area: 0% % Reduction in Volume: 0% Epithelialization: Small (1-33%) Tunneling: No Undermining: No Wound Description Classification: Full Thickness Without Exposed Support Structures Wound Margin: Distinct, outline attached Exudate  Amount: Medium Exudate Type: Serosanguineous Exudate Color: red, brown Foul Odor After Cleansing: No Slough/Fibrino No Wound Bed Granulation Amount: Medium (34-66%) Exposed Structure Granulation Quality: Red, Pink Fascia Exposed: No Necrotic Amount: Medium (34-66%) Fat Layer (Subcutaneous Tissue) Exposed: Yes Necrotic Quality: Adherent Slough Tendon Exposed: No Muscle Exposed: No Joint Exposed: No Bone Exposed: No Treatment Notes Wound #1 (Lower Leg) Wound Laterality: Left, Anterior Cleanser Soap and Water Discharge Instruction: May shower and wash wound with dial antibacterial soap and water prior to dressing change. Wound Cleanser Discharge Instruction: Cleanse the wound with wound cleanser prior to applying a clean dressing using gauze sponges, not tissue or cotton balls. Peri-Wound Care Sween Lotion (Moisturizing lotion) Discharge Instruction: Apply moisturizing lotion as directed Topical Gentamicin Discharge Instruction: Apply directly to wound bed under the primary dressing. Primary Dressing Hydrofera Blue Ready Foam, 2.5 x2.5 in Discharge Instruction: Apply to wound bed over the  gentamicin ointment. Secondary Dressing ABD Pad, 8x10 Discharge Instruction: Apply over primary dressing as directed. Secured With Compression Wrap ThreePress (3 layer compression wrap) Discharge Instruction: Apply three layer compression as directed. Compression Stockings Add-Ons Electronic Signature(s) Signed: 01/29/2021 5:43:46 PM By: Baruch Gouty RN, BSN Signed: 01/29/2021 5:52:30 PM By: Deon Pilling RN, BSN Entered By: Deon Pilling on 01/29/2021 14:19:25 -------------------------------------------------------------------------------- Vitals Details Patient Name: Date of Service: Stephanie Shaw 01/29/2021 1:15 PM Medical Record Number: [id number] Patient Account Number: [id number] Date of Birth/Sex: Treating RN: 08/13/1940 (74 y.o. Stephanie Shaw Primary Care Zakira Ressel:  Stephanie Shaw Other Clinician: Referring Nandi Tonnesen: Treating Quorra Rosene/Extender: Kathie Rhodes Weeks in Treatment: 0 Vital Signs Time Taken: 13:32 Temperature (F): 97.4 Height (in): 68 Pulse (bpm): 79 Source: Stated Respiratory Rate (breaths/min): 18 Weight (lbs): 132 Blood Pressure (mmHg): 133/70 Source: Stated Reference Range: 80 - 120 mg / dl Body Mass Index (BMI): 20.1 Electronic Signature(s) Signed: 01/29/2021 5:43:46 PM By: Baruch Gouty RN, BSN Entered By: Baruch Gouty on 01/29/2021 13:33:18

## 2021-01-29 NOTE — Progress Notes (Signed)
Stephanie Shaw ([id number]) Visit Report for 01/29/2021 Abuse/Suicide Risk Screen Details Patient Name: Date of Service: Stephanie Shaw, Stephanie Shaw 01/29/2021 1:15 PM Medical Record Number: [id number] Patient Account Number: [id number] Date of Birth/Sex: Treating RN: 01-06-1941 (81 y.o. Elam Dutch Primary Care Shanyn Preisler: Veleta Miners Other Clinician: Referring Kalli Greenfield: Treating Arzu Mcgaughey/Extender: Kathie Rhodes Weeks in Treatment: 0 Abuse/Suicide Risk Screen Items Answer ABUSE RISK SCREEN: Has anyone close to you tried to hurt or harm you recentlyo No Do you feel uncomfortable with anyone in your familyo No Has anyone forced you do things that you didnt want to doo No Electronic Signature(s) Signed: 01/29/2021 5:43:46 PM By: Baruch Gouty RN, BSN Entered By: Baruch Gouty on 01/29/2021 14:02:43 -------------------------------------------------------------------------------- Activities of Daily Living Details Patient Name: Date of Service: Stephanie Shaw 01/29/2021 1:15 PM Medical Record Number: [id number] Patient Account Number: [id number] Date of Birth/Sex: Treating RN: 08/02/1940 (81 y.o. Elam Dutch Primary Care Arisbel Maione: Veleta Miners Other Clinician: Referring Allye Hoyos: Treating Angel Weedon/Extender: Cleda Daub in Treatment: 0 Activities of Daily Living Items Answer Activities of Daily Living (Please select one for each item) Drive Automobile Not Able T Medications ake Completely Able Use T elephone Completely Able Care for Appearance Need Assistance Use T oilet Need Assistance Bath / Shower Need Assistance Dress Self Need Assistance Feed Self Completely Able Walk Need Assistance Get In / Out Bed Need Assistance Housework Need Assistance Prepare Meals Need Assistance Handle Money Need Assistance Shop for Self Need Assistance Electronic Signature(s) Signed: 01/29/2021 5:43:46 PM By: Baruch Gouty RN,  BSN Entered By: Baruch Gouty on 01/29/2021 14:03:18 -------------------------------------------------------------------------------- Education Screening Details Patient Name: Date of Service: Stephanie Shaw 01/29/2021 1:15 PM Medical Record Number: [id number] Patient Account Number: [id number] Date of Birth/Sex: Treating RN: Apr 17, 1940 (81 y.o. Elam Dutch Primary Care Ladanian Kelter: Veleta Miners Other Clinician: Referring Leiby Pigeon: Treating Kortland Nichols/Extender: Cleda Daub in Treatment: 0 Primary Learner Assessed: Patient Learning Preferences/Education Level/Primary Language Learning Preference: Explanation, Demonstration, Printed Material Highest Education Level: College or Above Preferred Language: English Cognitive Barrier Language Barrier: No Translator Needed: No Memory Deficit: No Emotional Barrier: No Cultural/Religious Beliefs Affecting Medical Care: No Physical Barrier Impaired Vision: Yes Glasses Impaired Hearing: No Decreased Hand dexterity: No Knowledge/Comprehension Knowledge Level: High Comprehension Level: High Ability to understand written instructions: High Ability to understand verbal instructions: High Motivation Anxiety Level: Calm Cooperation: Cooperative Education Importance: Acknowledges Need Interest in Health Problems: Asks Questions Perception: Coherent Willingness to Engage in Self-Management High Activities: Readiness to Engage in Self-Management High Activities: Electronic Signature(s) Signed: 01/29/2021 5:43:46 PM By: Baruch Gouty RN, BSN Entered By: Baruch Gouty on 01/29/2021 14:03:37 -------------------------------------------------------------------------------- Fall Risk Assessment Details Patient Name: Date of Service: Stephanie Shaw 01/29/2021 1:15 PM Medical Record Number: [id number] Patient Account Number: [id number] Date of Birth/Sex: Treating RN: 04-20-1940 (82 y.o. [name],  [name] Primary Care Aaria Happ: Veleta Miners Other Clinician: Referring Tuyet Bader: Treating Hatsue Sime/Extender: Cleda Daub in Treatment: 0 Fall Risk Assessment Items Have you had 2 or more falls in the last 12 monthso 0 No Have you had any fall that resulted in injury in the last 12 monthso 0 No FALLS RISK SCREEN History of falling - immediate or within 3 months 0 No Secondary diagnosis (Do you have 2 or more medical diagnoseso) 0 No Ambulatory aid None/bed rest/wheelchair/nurse 0 Yes Crutches/cane/Klani 15 Yes Furniture 0 No Intravenous therapy Access/Saline/Heparin Lock 0 No Gait/Transferring Normal/ bed rest/ wheelchair 0 No Weak (short  steps with or without shuffle, stooped but able to lift head while walking, may seek 10 Yes support from furniture) Impaired (short steps with shuffle, may have difficulty arising from chair, head down, impaired 0 No balance) Mental Status Oriented to own ability 0 Yes Electronic Signature(s) Signed: 01/29/2021 5:43:46 PM By: Baruch Gouty RN, BSN Entered By: Baruch Gouty on 01/29/2021 14:03:53 -------------------------------------------------------------------------------- Foot Assessment Details Patient Name: Date of Service: Stephanie Shaw 01/29/2021 1:15 PM Medical Record Number: [id number] Patient Account Number: [id number] Date of Birth/Sex: Treating RN: Jul 27, 1940 (81 y.o. Elam Dutch Primary Care Hervey Wedig: Veleta Miners Other Clinician: Referring Handy Mcloud: Treating Geralynn Capri/Extender: Kathie Rhodes Weeks in Treatment: 0 Foot Assessment Items Site Locations + = Sensation present, - = Sensation absent, C = Callus, U = Ulcer R = Redness, W = Warmth, M = Maceration, PU = Pre-ulcerative lesion F = Fissure, S = Swelling, D = Dryness Assessment Right: Left: Other Deformity: No No Prior Foot Ulcer: No No Prior Amputation: No No Charcot Joint: No No Ambulatory Status:  Ambulatory With Help Assistance Device: Wheelchair Gait: Administrator, arts) Signed: 01/29/2021 5:43:46 PM By: Baruch Gouty RN, BSN Entered By: Baruch Gouty on 01/29/2021 14:18:28 -------------------------------------------------------------------------------- Nutrition Risk Screening Details Patient Name: Date of Service: Stephanie Shaw 01/29/2021 1:15 PM Medical Record Number: [id number] Patient Account Number: [id number] Date of Birth/Sex: Treating RN: 03-21-1940 (81 y.o. [name], [name] Primary Care Sybil Shrader: Veleta Miners Other Clinician: Referring Kwaku Mostafa: Treating Kort Stettler/Extender: Kathie Rhodes Weeks in Treatment: 0 Height (in): 68 Weight (lbs): 132 Body Mass Index (BMI): 20.1 Nutrition Risk Screening Items Score Screening NUTRITION RISK SCREEN: I have an illness or condition that made me change the kind and/or amount of food I eat 2 Yes I eat fewer than two meals per day 0 No I eat few fruits and vegetables, or milk products 0 No I have three or more drinks of beer, liquor or wine almost every day 0 No I have tooth or mouth problems that make it hard for me to eat 0 No I don't always have enough money to buy the food I need 0 No I eat alone most of the time 0 No I take three or more different prescribed or over-the-counter drugs a day 1 Yes Without wanting to, I have lost or gained 10 pounds in the last six months 0 No I am not always physically able to shop, cook and/or feed myself 0 No Nutrition Protocols Good Risk Protocol Moderate Risk Protocol 0 Provide education on nutrition High Risk Proctocol Risk Level: Moderate Risk Score: 3 Electronic Signature(s) Signed: 01/29/2021 5:43:46 PM By: Baruch Gouty RN, BSN Entered By: Baruch Gouty on 01/29/2021 14:04:10

## 2021-02-01 DIAGNOSIS — N3946 Mixed incontinence: Secondary | ICD-10-CM | POA: Diagnosis not present

## 2021-02-04 ENCOUNTER — Encounter (HOSPITAL_BASED_OUTPATIENT_CLINIC_OR_DEPARTMENT_OTHER): Payer: Medicare PPO | Admitting: Internal Medicine

## 2021-02-04 ENCOUNTER — Other Ambulatory Visit: Payer: Self-pay

## 2021-02-04 DIAGNOSIS — I4891 Unspecified atrial fibrillation: Secondary | ICD-10-CM | POA: Diagnosis not present

## 2021-02-04 DIAGNOSIS — I872 Venous insufficiency (chronic) (peripheral): Secondary | ICD-10-CM

## 2021-02-04 DIAGNOSIS — M1990 Unspecified osteoarthritis, unspecified site: Secondary | ICD-10-CM | POA: Diagnosis not present

## 2021-02-04 DIAGNOSIS — L97822 Non-pressure chronic ulcer of other part of left lower leg with fat layer exposed: Secondary | ICD-10-CM | POA: Diagnosis not present

## 2021-02-04 DIAGNOSIS — I890 Lymphedema, not elsewhere classified: Secondary | ICD-10-CM | POA: Diagnosis not present

## 2021-02-04 DIAGNOSIS — G20 Parkinson's disease: Secondary | ICD-10-CM | POA: Diagnosis not present

## 2021-02-06 DIAGNOSIS — N390 Urinary tract infection, site not specified: Secondary | ICD-10-CM | POA: Diagnosis not present

## 2021-02-06 NOTE — Progress Notes (Signed)
Stephanie Shaw, Stephanie Shaw ([id number]) Visit Report for 02/04/2021 Arrival Information Details Patient Name: Date of Service: Stephanie Shaw, Stephanie Shaw 02/04/2021 1:45 PM Medical Record Number: [id number] Patient Account Number: [id number] Date of Birth/Sex: Treating RN: 23-Aug-1940 (81 y.o. Stephanie Shaw Primary Care Jamillia Closson: Veleta Miners Other Clinician: Referring Debrina Kizer: Treating Hoang Pettingill/Extender: Cleda Daub in Treatment: 0 Visit Information History Since Last Visit Added or deleted any medications: No Patient Arrived: Ambulatory Any new allergies or adverse reactions: No Arrival Time: 14:20 Had a fall or experienced change in No Accompanied By: caregiver activities of daily living that may affect Transfer Assistance: EasyPivot Patient Lift risk of falls: Patient Identification Verified: Yes Signs or symptoms of abuse/neglect since last visito No Secondary Verification Process Completed: Yes Hospitalized since last visit: No Patient Requires Transmission-Based Precautions: No Implantable device outside of the clinic excluding No Patient Has Alerts: No cellular tissue based products placed in the center since last visit: Has Dressing in Place as Prescribed: Yes Pain Present Now: No Electronic Signature(s) Signed: 02/05/2021 8:10:57 AM By: Sandre Kitty Entered By: Sandre Kitty on 02/04/2021 14:20:59 -------------------------------------------------------------------------------- Compression Therapy Details Patient Name: Date of Service: Stephanie Shaw 02/04/2021 1:45 PM Medical Record Number: [id number] Patient Account Number: [id number] Date of Birth/Sex: Treating RN: [birth year] (33 y.o. Stephanie Shaw Primary Care Ludie Pavlik: Veleta Miners Other Clinician: Referring Ferlando Lia: Treating Captain Blucher/Extender: Kathie Rhodes Weeks in Treatment: 0 Compression Therapy Performed for Wound Assessment: Wound #1 Left,Anterior Lower  Leg Performed By: Clinician Levan Hurst, RN Compression Type: Three Layer Post Procedure Diagnosis Same as Pre-procedure Electronic Signature(s) Signed: 02/06/2021 5:10:09 PM By: Levan Hurst RN, BSN Entered By: Levan Hurst on 02/04/2021 14:36:13 -------------------------------------------------------------------------------- Encounter Discharge Information Details Patient Name: Date of Service: Stephanie Shaw 02/04/2021 1:45 PM Medical Record Number: [id number] Patient Account Number: [id number] Date of Birth/Sex: Treating RN: [birth year] (51 y.o. Stephanie Shaw Primary Care Kim Lauver: Veleta Miners Other Clinician: Referring Taelon Bendorf: Treating Mohamedamin Nifong/Extender: Cleda Daub in Treatment: 0 Encounter Discharge Information Items Post Procedure Vitals Discharge Condition: Stable Temperature (F): 97.5 Ambulatory Status: Wheelchair Pulse (bpm): 80 Discharge Destination: Home Respiratory Rate (breaths/min): 18 Transportation: Private Auto Blood Pressure (mmHg): 100/66 Accompanied By: caregiver Schedule Follow-up Appointment: Yes Clinical Summary of Care: Patient Declined Electronic Signature(s) Signed: 02/06/2021 5:10:09 PM By: Levan Hurst RN, BSN Entered By: Levan Hurst on 02/04/2021 16:54:40 -------------------------------------------------------------------------------- Lower Extremity Assessment Details Patient Name: Date of Service: Stephanie Shaw 02/04/2021 1:45 PM Medical Record Number: [id number] Patient Account Number: [id number] Date of Birth/Sex: Treating RN: [birth year] (68 y.o. Stephanie Shaw Primary Care Rollen Selders: Veleta Miners Other Clinician: Referring Aryahi Denzler: Treating Hatsuko Bizzarro/Extender: Kathie Rhodes Weeks in Treatment: 0 Edema Assessment Assessed: Stephanie Shaw: No] [Right: No] Edema: [Left: Ye] [Right: s] Calf Left: Right: Point of Measurement: 30 cm From Medial Instep 35 cm Ankle Left:  Right: Point of Measurement: 10 cm From Medial Instep 23 cm Vascular Assessment Pulses: Dorsalis Pedis Palpable: [Left:Yes] Electronic Signature(s) Signed: 02/06/2021 5:10:09 PM By: Levan Hurst RN, BSN Entered By: Levan Hurst on 02/04/2021 14:35:51 -------------------------------------------------------------------------------- Multi Wound Chart Details Patient Name: Date of Service: Stephanie Shaw 02/04/2021 1:45 PM Medical Record Number: [id number] Patient Account Number: [id number] Date of Birth/Sex: Treating RN: January 11, 1941 (80 y.o. Stephanie Shaw Primary Care Zayquan Bogard: Veleta Miners Other Clinician: Referring Caylor Tallarico: Treating Kieanna Rollo/Extender: Kathie Rhodes Weeks in Treatment: 0 Vital Signs Height(in): 68 Pulse(bpm): 80 Weight(lbs): 132 Blood Pressure(mmHg): 100/66 Body Mass Index(BMI): 20 Temperature(F): 97.5  Respiratory Rate(breaths/min): 18 Photos: [N/A:N/A] Left, Anterior Lower Leg N/A N/A Wound Location: Gradually Appeared N/A N/A Wounding Event: Lymphedema N/A N/A Primary Etiology: Cataracts, Anemia, Arrhythmia, N/A N/A Comorbid History: Hypotension, Peripheral Venous Disease, Osteoarthritis 08/20/2020 N/A N/A Date Acquired: 0 N/A N/A Weeks of Treatment: Open N/A N/A Wound Status: Yes N/A N/A Clustered Wound: 4 N/A N/A Clustered Quantity: 1.6x1.3x0.1 N/A N/A Measurements L x W x D (cm) 1.634 N/A N/A A (cm) : rea 0.163 N/A N/A Volume (cm) : 27.80% N/A N/A % Reduction in A rea: 27.90% N/A N/A % Reduction in Volume: Full Thickness Without Exposed N/A N/A Classification: Support Structures Medium N/A N/A Exudate A mount: Serosanguineous N/A N/A Exudate Type: red, brown N/A N/A Exudate Color: Distinct, outline attached N/A N/A Wound Margin: Large (67-100%) N/A N/A Granulation A mount: Red, Pink N/A N/A Granulation Quality: Small (1-33%) N/A N/A Necrotic A mount: Fat Layer (Subcutaneous Tissue): Yes  N/A N/A Exposed Structures: Fascia: No Tendon: No Muscle: No Joint: No Bone: No Small (1-33%) N/A N/A Epithelialization: Debridement - Excisional N/A N/A Debridement: Pre-procedure Verification/Time Out 14:32 N/A N/A Taken: Subcutaneous N/A N/A Tissue Debrided: Skin/Subcutaneous Tissue N/A N/A Level: 2.08 N/A N/A Debridement A (sq cm): rea Curette N/A N/A Instrument: Minimum N/A N/A Bleeding: Pressure N/A N/A Hemostasis A chieved: 0 N/A N/A Procedural Pain: 0 N/A N/A Post Procedural Pain: Procedure was tolerated well N/A N/A Debridement Treatment Response: 1.6x1.3x0.1 N/A N/A Post Debridement Measurements L x W x D (cm) 0.163 N/A N/A Post Debridement Volume: (cm) Compression Therapy N/A N/A Procedures Performed: Debridement Treatment Notes Electronic Signature(s) Signed: 02/05/2021 9:05:14 AM By: Kalman Shan DO Signed: 02/05/2021 3:58:04 PM By: Lorrin Jackson Entered By: Kalman Shan on 02/04/2021 15:21:48 -------------------------------------------------------------------------------- Multi-Disciplinary Care Plan Details Patient Name: Date of Service: Stephanie Shaw 02/04/2021 1:45 PM Medical Record Number: [id number] Patient Account Number: [id number] Date of Birth/Sex: Treating RN: 1940-12-22 (47 y.o. Stephanie Shaw Primary Care Arica Bevilacqua: Veleta Miners Other Clinician: Referring Surafel Hilleary: Treating Chekesha Behlke/Extender: Kathie Rhodes Weeks in Treatment: 0 Active Inactive Abuse / Safety / Falls / Self Care Management Nursing Diagnoses: Potential for falls Goals: Patient will remain injury free related to falls Date Initiated: 01/29/2021 Target Resolution Date: 02/22/2021 Goal Status: Active Interventions: Assess self care needs on admission and as needed Provide education on fall prevention Notes: Orientation to the Wound Care Program Nursing Diagnoses: Knowledge deficit related to the wound healing center  program Goals: Patient/caregiver will verbalize understanding of the [facility] Date Initiated: 01/29/2021 Target Resolution Date: 02/21/2021 Goal Status: Active Interventions: Provide education on orientation to the wound center Notes: Pain, Acute or Chronic Nursing Diagnoses: Pain, acute or chronic: actual or potential Potential alteration in comfort, pain Goals: Patient will verbalize adequate pain control and receive pain control interventions during procedures as needed Date Initiated: 01/29/2021 Target Resolution Date: 02/22/2021 Goal Status: Active Patient/caregiver will verbalize comfort level met Date Initiated: 01/29/2021 Target Resolution Date: 02/21/2021 Goal Status: Active Interventions: Encourage patient to take pain medications as prescribed Provide education on pain management Reposition patient for comfort Treatment Activities: Administer pain control measures as ordered : 01/29/2021 Notes: Wound/Skin Impairment Nursing Diagnoses: Knowledge deficit related to ulceration/compromised skin integrity Goals: Patient/caregiver will verbalize understanding of skin care regimen Date Initiated: 01/29/2021 Target Resolution Date: 02/21/2021 Goal Status: Active Interventions: Assess patient/caregiver ability to perform ulcer/skin care regimen upon admission and as needed Assess ulceration(s) every visit Provide education on ulcer and skin care Treatment Activities: Skin care regimen initiated :  01/29/2021 Topical wound management initiated : 01/29/2021 Notes: Electronic Signature(s) Signed: 02/06/2021 5:10:09 PM By: Levan Hurst RN, BSN Entered By: Levan Hurst on 02/04/2021 16:46:21 -------------------------------------------------------------------------------- Pain Assessment Details Patient Name: Date of Service: Stephanie Shaw 02/04/2021 1:45 PM Medical Record Number: [id number] Patient Account Number: [id number] Date of Birth/Sex: Treating  RN: [birth year] (76 y.o. Stephanie Shaw Primary Care Oryon Gary: Veleta Miners Other Clinician: Referring Vinnie Gombert: Treating Sherrina Zaugg/Extender: Kathie Rhodes Weeks in Treatment: 0 Active Problems Location of Pain Severity and Description of Pain Patient Has Paino No Site Locations Pain Management and Medication Current Pain Management: Electronic Signature(s) Signed: 02/05/2021 8:10:57 AM By: Sandre Kitty Signed: 02/05/2021 3:58:04 PM By: Lorrin Jackson Entered By: Sandre Kitty on 02/04/2021 14:21:24 -------------------------------------------------------------------------------- Patient/Caregiver Education Details Patient Name: Date of Service: Stephanie Shaw 1/16/2023andnbsp1:45 PM Medical Record Number: [id number] Patient Account Number: [id number] Date of Birth/Gender: Treating RN: [birth year] (45 y.o. Stephanie Shaw Primary Care Physician: Veleta Miners Other Clinician: Referring Physician: Treating Physician/Extender: Cleda Daub in Treatment: 0 Education Assessment Education Provided To: Patient Education Topics Provided Safety: Methods: Explain/Verbal Responses: State content correctly Wound/Skin Impairment: Methods: Explain/Verbal Responses: State content correctly Electronic Signature(s) Signed: 02/06/2021 5:10:09 PM By: Levan Hurst RN, BSN Entered By: Levan Hurst on 02/04/2021 16:46:50 -------------------------------------------------------------------------------- Wound Assessment Details Patient Name: Date of Service: Stephanie Shaw 02/04/2021 1:45 PM Medical Record Number: [id number] Patient Account Number: [id number] Date of Birth/Sex: Treating RN: 03-26-1940 (66 y.o. Stephanie Shaw Primary Care Edris Friedt: Veleta Miners Other Clinician: Referring Zemira Zehring: Treating Carlie Solorzano/Extender: Kathie Rhodes Weeks in Treatment: 0 Wound Status Wound Number: 1 Primary  Lymphedema Etiology: Wound Location: Left, Anterior Lower Leg Wound Open Wounding Event: Gradually Appeared Status: Date Acquired: 08/20/2020 Comorbid Cataracts, Anemia, Arrhythmia, Hypotension, Peripheral Venous Weeks Of Treatment: 0 History: Disease, Osteoarthritis Clustered Wound: Yes Photos Wound Measurements Length: (cm) 1.6 Width: (cm) 1.3 Depth: (cm) 0.1 Clustered Quantity: 4 Area: (cm) 1.634 Volume: (cm) 0.163 % Reduction in Area: 27.8% % Reduction in Volume: 27.9% Epithelialization: Small (1-33%) Tunneling: No Undermining: No Wound Description Classification: Full Thickness Without Exposed Support Structures Wound Margin: Distinct, outline attached Exudate Amount: Medium Exudate Type: Serosanguineous Exudate Color: red, brown Foul Odor After Cleansing: No Slough/Fibrino Yes Wound Bed Granulation Amount: Large (67-100%) Exposed Structure Granulation Quality: Red, Pink Fascia Exposed: No Necrotic Amount: Small (1-33%) Fat Layer (Subcutaneous Tissue) Exposed: Yes Necrotic Quality: Adherent Slough Tendon Exposed: No Muscle Exposed: No Joint Exposed: No Bone Exposed: No Treatment Notes Wound #1 (Lower Leg) Wound Laterality: Left, Anterior Cleanser Soap and Water Discharge Instruction: May shower and wash wound with dial antibacterial soap and water prior to dressing change. Wound Cleanser Discharge Instruction: Cleanse the wound with wound cleanser prior to applying a clean dressing using gauze sponges, not tissue or cotton balls. Peri-Wound Care Sween Lotion (Moisturizing lotion) Discharge Instruction: Apply moisturizing lotion as directed Topical Gentamicin Discharge Instruction: Apply directly to wound bed under the primary dressing. Primary Dressing Hydrofera Blue Ready Foam, 2.5 x2.5 in Discharge Instruction: Apply to wound bed over the gentamicin ointment. Secondary Dressing Woven Gauze Sponge, Non-Sterile 4x4 in Discharge Instruction: Apply over  primary dressing as directed. Secured With Compression Wrap ThreePress (3 layer compression wrap) Discharge Instruction: Apply three layer compression as directed. Compression Stockings Add-Ons Electronic Signature(s) Signed: 02/05/2021 3:58:04 PM By: Lorrin Jackson Signed: 02/06/2021 5:10:09 PM By: Levan Hurst RN, BSN Entered By: Levan Hurst on 02/04/2021 14:35:39 -------------------------------------------------------------------------------- Vitals Details Patient Name: Date of Service: Stephanie Punches  Sigurd Shaw 02/04/2021 1:45 PM Medical Record Number: [id number] Patient Account Number: [id number] Date of Birth/Sex: Treating RN: Apr 01, 1940 (31 y.o. Stephanie Shaw Primary Care Gisell Buehrle: Veleta Miners Other Clinician: Referring Cederick Broadnax: Treating Aadyn Buchheit/Extender: Kathie Rhodes Weeks in Treatment: 0 Vital Signs Time Taken: 14:20 Temperature (F): 97.5 Height (in): 68 Pulse (bpm): 80 Weight (lbs): 132 Respiratory Rate (breaths/min): 18 Body Mass Index (BMI): 20.1 Blood Pressure (mmHg): 100/66 Reference Range: 80 - 120 mg / dl Electronic Signature(s) Signed: 02/05/2021 8:10:57 AM By: Sandre Kitty Entered By: Sandre Kitty on 02/04/2021 14:21:17

## 2021-02-06 NOTE — Progress Notes (Signed)
Stephanie Shaw ([id number]) Visit Report for 02/04/2021 Chief Complaint Document Details Patient Name: Date of Service: Stephanie Shaw 02/04/2021 1:45 PM Medical Record Number: [id number] Patient Account Number: [id number] Date of Birth/Sex: Treating RN: 01/10/1941 (81 y.o. Stephanie Shaw Primary Care Provider: Veleta Miners Other Clinician: Referring Provider: Treating Provider/Extender: Kathie Rhodes Weeks in Treatment: 0 Information Obtained from: Patient Chief Complaint Left lower extremity wound following trauma Electronic Signature(s) Signed: 02/05/2021 9:05:14 AM By: Kalman Shan DO Entered By: Kalman Shan on 02/04/2021 15:22:02 -------------------------------------------------------------------------------- Debridement Details Patient Name: Date of Service: Stephanie Shaw 02/04/2021 1:45 PM Medical Record Number: [id number] Patient Account Number: [id number] Date of Birth/Sex: Treating RN: 1940-01-30 (57 y.o. Stephanie Shaw Primary Care Provider: Veleta Miners Other Clinician: Referring Provider: Treating Provider/Extender: Kathie Rhodes Weeks in Treatment: 0 Debridement Performed for Assessment: Wound #1 Left,Anterior Lower Leg Performed By: Physician Kalman Shan, DO Debridement Type: Debridement Level of Consciousness (Pre-procedure): Awake and Alert Pre-procedure Verification/Time Out Yes - 14:32 Taken: Start Time: 14:32 T Area Debrided (L x W): otal 1.6 (cm) x 1.3 (cm) = 2.08 (cm) Tissue and other material debrided: Non-Viable, Subcutaneous Level: Skin/Subcutaneous Tissue Debridement Description: Excisional Instrument: Curette Bleeding: Minimum Hemostasis Achieved: Pressure End Time: 14:34 Procedural Pain: 0 Post Procedural Pain: 0 Response to Treatment: Procedure was tolerated well Level of Consciousness (Post- Awake and Alert procedure): Post Debridement Measurements of Total Wound Length: (cm)  1.6 Width: (cm) 1.3 Depth: (cm) 0.1 Volume: (cm) 0.163 Character of Wound/Ulcer Post Debridement: Improved Post Procedure Diagnosis Same as Pre-procedure Electronic Signature(s) Signed: 02/05/2021 9:05:14 AM By: Kalman Shan DO Signed: 02/06/2021 5:10:09 PM By: Levan Hurst RN, BSN Entered By: Levan Hurst on 02/04/2021 14:35:13 -------------------------------------------------------------------------------- HPI Details Patient Name: Date of Service: Stephanie Shaw 02/04/2021 1:45 PM Medical Record Number: [id number] Patient Account Number: [id number] Date of Birth/Sex: Treating RN: [birth year] (18 y.o. Stephanie Shaw Primary Care Provider: Veleta Miners Other Clinician: Referring Provider: Treating Provider/Extender: Kathie Rhodes Weeks in Treatment: 0 History of Present Illness HPI Description: Admission 01/29/2021 Stephanie Shaw is an 81 year old female with a past medical history of A. fib, chronic venous insufficiency and Parkinson's that presents to the clinic for 4- 1/28-month history of nonhealing wound to the anterior aspect of the left lower extremity. She states that on September 10, 2020 her compression stockings were being replaced by the CNA at her facility and she developed a skin tear. She states that overall the wound is stable with slight improvement in wound healing for the past several months. She has been using Xeroform to the wound bed. She wears her compression stockings every other day. She currently denies signs of infection. 1/16; patient presents for follow-up. She tolerated the compression wrap well. She has no issues or complaints today. She denies signs of infection. She reports improvement in wound healing. Electronic Signature(s) Signed: 02/05/2021 9:05:14 AM By: Kalman Shan DO Entered By: Kalman Shan on 02/04/2021 15:22:29 -------------------------------------------------------------------------------- Physical  Exam Details Patient Name: Date of Service: Stephanie Shaw 02/04/2021 1:45 PM Medical Record Number: [id number] Patient Account Number: [id number] Date of Birth/Sex: Treating RN: 19-May-1940 (21 y.o. Stephanie Shaw Primary Care Provider: Veleta Miners Other Clinician: Referring Provider: Treating Provider/Extender: Kathie Rhodes Weeks in Treatment: 0 Constitutional respirations regular, non-labored and within target range for patient.. Cardiovascular 2+ dorsalis pedis/posterior tibialis pulses. Psychiatric pleasant and cooperative. Notes Left lower extremity: T the anterior aspect there is a small cluster of  wounds with scant nonviable tissue and granulation tissue present. No surrounding signs o of infection. Good edema control. Electronic Signature(s) Signed: 02/05/2021 9:05:14 AM By: Kalman Shan DO Entered By: Kalman Shan on 02/04/2021 15:23:11 -------------------------------------------------------------------------------- Physician Orders Details Patient Name: Date of Service: Stephanie Shaw 02/04/2021 1:45 PM Medical Record Number: [id number] Patient Account Number: [id number] Date of Birth/Sex: Treating RN: 09-Oct-1940 (65 y.o. Stephanie Shaw Primary Care Provider: Veleta Miners Other Clinician: Referring Provider: Treating Provider/Extender: Cleda Daub in Treatment: 0 Verbal / Phone Orders: No Diagnosis Coding ICD-10 Coding Code Description [phone] Non-pressure chronic ulcer of other part of left lower leg with fat layer exposed I87.2 Venous insufficiency (chronic) (peripheral) I89.0 Lymphedema, not elsewhere classified G20 Parkinson's disease I48.91 Unspecified atrial fibrillation Follow-up Appointments ppointment in 1 week. - Dr. Heber [place] Return A Bathing/ Shower/ Hygiene May shower with protection but do not get wound dressing(s) wet. - when not changing the dressing use a cast protector. May  shower and wash wound with soap and water. - Facility to remove compression wrap, wash wound with soap and water on Friday shower days. Edema Control - Lymphedema / SCD / Other Elevate legs to the level of the heart or above for 30 minutes daily and/or when sitting, a frequency of: - 3-4 times a throughout the day. Avoid standing for long periods of time. Patient to wear own compression stockings every day. - On RIGHT leg only. Apply in the morning and remove at night. Exercise regularly Moisturize legs daily. - right leg every night before bed. Wound Treatment Wound #1 - Lower Leg Wound Laterality: Left, Anterior Cleanser: Soap and Water 2 x Per Week/30 Days Discharge Instructions: May shower and wash wound with dial antibacterial soap and water prior to dressing change. Cleanser: Wound Cleanser 2 x Per Week/30 Days Discharge Instructions: Cleanse the wound with wound cleanser prior to applying a clean dressing using gauze sponges, not tissue or cotton balls. Peri-Wound Care: Sween Lotion (Moisturizing lotion) 2 x Per Week/30 Days Discharge Instructions: Apply moisturizing lotion as directed Topical: Gentamicin 2 x Per Week/30 Days Discharge Instructions: Apply directly to wound bed under the primary dressing. Prim Dressing: Hydrofera Blue Ready Foam, 2.5 x2.5 in 2 x Per Week/30 Days ary Discharge Instructions: Apply to wound bed over the gentamicin ointment. Secondary Dressing: Woven Gauze Sponge, Non-Sterile 4x4 in 2 x Per Week/30 Days Discharge Instructions: Apply over primary dressing as directed. Compression Wrap: ThreePress (3 layer compression wrap) 2 x Per Week/30 Days Discharge Instructions: Apply three layer compression as directed. Electronic Signature(s) Signed: 02/05/2021 9:05:14 AM By: Kalman Shan DO Signed: 02/06/2021 5:10:09 PM By: Levan Hurst RN, BSN Entered By: Levan Hurst on 02/04/2021  14:37:51 -------------------------------------------------------------------------------- Problem List Details Patient Name: Date of Service: Stephanie Shaw 02/04/2021 1:45 PM Medical Record Number: [id number] Patient Account Number: [id number] Date of Birth/Sex: Treating RN: 1941/01/08 (55 y.o. 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/10/2021 No Yes I87.2 Venous insufficiency (chronic) (peripheral) 01/29/2021 No Yes I89.0 Lymphedema, not elsewhere classified 01/29/2021 No Yes G20 Parkinson's disease 01/29/2021 No Yes I48.91 Unspecified atrial fibrillation 01/29/2021 No Yes Inactive Problems Resolved Problems Electronic Signature(s) Signed: 02/05/2021 9:05:14 AM By: Kalman Shan DO Entered By: Kalman Shan on 02/04/2021 15:21:42 -------------------------------------------------------------------------------- Progress Note Details Patient  Name: Date of Service: DACI, STUBBE 02/04/2021 1:45 PM Medical Record Number: [id number] Patient Account Number: [id number] Date of Birth/Sex: Treating RN: Sep 23, 1940 (68 y.o. Stephanie Shaw Primary Care Provider: Veleta Miners Other Clinician: Referring Provider: Treating Provider/Extender: Kathie Rhodes Weeks in Treatment: 0 Subjective Chief Complaint Information obtained from Patient Left lower extremity wound following trauma History of Present Illness (HPI) Admission 01/29/2021 Ms. Zyanne Schumm is an 81 year old female with a past medical history of A. fib, chronic venous insufficiency and Parkinson's that presents to the clinic for 4- 1/32-month history of nonhealing wound to the anterior aspect of the  left lower extremity. She states that on September 10, 2020 her compression stockings were being replaced by the CNA at her facility and she developed a skin tear. She states that overall the wound is stable with slight improvement in wound healing for the past several months. She has been using Xeroform to the wound bed. She wears her compression stockings every other day. She currently denies signs of infection. 1/16; patient presents for follow-up. She tolerated the compression wrap well. She has no issues or complaints today. She denies signs of infection. She reports improvement in wound healing. Patient History Information obtained from Patient, Chart. Family History Cancer - Mother, Diabetes - Mother, Heart Disease - Mother, Hypertension - Mother, Kidney Disease - Father, Stroke - Siblings, No family history of Hereditary Spherocytosis, Lung Disease, Seizures, Thyroid Problems, Tuberculosis. Social History Never smoker, Marital Status - Divorced, Alcohol Use - Never, Drug Use - No History, Caffeine Use - Never. Medical History Eyes Patient has history of Cataracts - removed Denies history of Glaucoma, Optic Neuritis Ear/Nose/Mouth/Throat Denies history of Chronic sinus problems/congestion, Middle ear problems Hematologic/Lymphatic Patient has history of Anemia Denies history of Hemophilia, Human Immunodeficiency Virus, Lymphedema, Sickle Cell Disease Respiratory Denies history of Aspiration, Asthma, Chronic Obstructive Pulmonary Disease (COPD), Pneumothorax, Sleep Apnea, Tuberculosis Cardiovascular Patient has history of Arrhythmia - A. Fib, Hypotension, Peripheral Venous Disease Denies history of Angina, Congestive Heart Failure, Coronary Artery Disease, Deep Vein Thrombosis, Hypertension, Myocardial Infarction, Peripheral Arterial Disease, Phlebitis, Vasculitis Gastrointestinal Denies history of Cirrhosis , Colitis, Crohnoos, Hepatitis A, Hepatitis B, Hepatitis  C Endocrine Denies history of Type I Diabetes, Type II Diabetes Genitourinary Denies history of End Stage Renal Disease Immunological Denies history of Lupus Erythematosus, Raynaudoos, Scleroderma Integumentary (Skin) Denies history of History of Burn Musculoskeletal Patient has history of Osteoarthritis - hips and knees Denies history of Gout, Rheumatoid Arthritis, Osteomyelitis Neurologic Denies history of Dementia, Neuropathy, Quadriplegia, Paraplegia, Seizure Disorder Oncologic Denies history of Received Chemotherapy, Received Radiation Psychiatric Denies history of Anorexia/bulimia, Confinement Anxiety Hospitalization/Surgery History - Left breast biopsy. Medical A Surgical History Notes nd Genitourinary Frequent UTI Immunological Parkinson's Disease Musculoskeletal DJD Objective Constitutional respirations regular, non-labored and within target range for patient.. Vitals Time Taken: 2:20 PM, Height: 68 in, Weight: 132 lbs, BMI: 20.1, Temperature: 97.5 F, Pulse: 80 bpm, Respiratory Rate: 18 breaths/min, Blood Pressure: 100/66 mmHg. Cardiovascular 2+ dorsalis pedis/posterior tibialis pulses. Psychiatric pleasant and cooperative. General Notes: Left lower extremity: T the anterior aspect there is a small cluster of wounds with scant nonviable tissue and granulation tissue present. No o surrounding signs of infection. Good edema control. Integumentary (Hair, Skin) Wound #1 status is Open. Original cause of wound was Gradually Appeared. The date acquired was: 08/20/2020. The wound is located on the Left,Anterior Lower Leg. The wound measures 1.6cm length x 1.3cm width x 0.1cm depth; 1.634cm^2 area and 0.163cm^3 volume. There is  Fat Layer (Subcutaneous Tissue) exposed. There is no tunneling or undermining noted. There is a medium amount of serosanguineous drainage noted. The wound margin is distinct with the outline attached to the wound base. There is large (67-100%) red,  pink granulation within the wound bed. There is a small (1-33%) amount of necrotic tissue within the wound bed including Adherent Slough. Assessment Active Problems ICD-10 Non-pressure chronic ulcer of other part of left lower leg with fat layer exposed Venous insufficiency (chronic) (peripheral) Lymphedema, not elsewhere classified Parkinson's disease Unspecified atrial fibrillation Patient's wound has shown improvement in size and appearance since last clinic visit. I debrided nonviable tissue. I recommended continuing gentamicin and Hydrofera Blue under compression therapy. Follow-up in 1 week Procedures Wound #1 Pre-procedure diagnosis of Wound #1 is a Lymphedema located on the Left,Anterior Lower Leg . There was a Excisional Skin/Subcutaneous Tissue Debridement with a total area of 2.08 sq cm performed by Kalman Shan, DO. With the following instrument(s): Curette to remove Non-Viable tissue/material. Material removed includes Subcutaneous Tissue. No specimens were taken. A time out was conducted at 14:32, prior to the start of the procedure. A Minimum amount of bleeding was controlled with Pressure. The procedure was tolerated well with a pain level of 0 throughout and a pain level of 0 following the procedure. Post Debridement Measurements: 1.6cm length x 1.3cm width x 0.1cm depth; 0.163cm^3 volume. Character of Wound/Ulcer Post Debridement is improved. Post procedure Diagnosis Wound #1: Same as Pre-Procedure Pre-procedure diagnosis of Wound #1 is a Lymphedema located on the Left,Anterior Lower Leg . There was a Three Layer Compression Therapy Procedure by Levan Hurst, RN. Post procedure Diagnosis Wound #1: Same as Pre-Procedure Plan Follow-up Appointments: Return Appointment in 1 week. - Dr. Heber [place] Bathing/ Shower/ Hygiene: May shower with protection but do not get wound dressing(s) wet. - when not changing the dressing use a cast protector. May shower and wash wound with  soap and water. - Facility to remove compression wrap, wash wound with soap and water on Friday shower days. Edema Control - Lymphedema / SCD / Other: Elevate legs to the level of the heart or above for 30 minutes daily and/or when sitting, a frequency of: - 3-4 times a throughout the day. Avoid standing for long periods of time. Patient to wear own compression stockings every day. - On RIGHT leg only. Apply in the morning and remove at night. Exercise regularly Moisturize legs daily. - right leg every night before bed. WOUND #1: - Lower Leg Wound Laterality: Left, Anterior Cleanser: Soap and Water 2 x Per Week/30 Days Discharge Instructions: May shower and wash wound with dial antibacterial soap and water prior to dressing change. Cleanser: Wound Cleanser 2 x Per Week/30 Days Discharge Instructions: Cleanse the wound with wound cleanser prior to applying a clean dressing using gauze sponges, not tissue or cotton balls. Peri-Wound Care: Sween Lotion (Moisturizing lotion) 2 x Per Week/30 Days Discharge Instructions: Apply moisturizing lotion as directed Topical: Gentamicin 2 x Per Week/30 Days Discharge Instructions: Apply directly to wound bed under the primary dressing. Prim Dressing: Hydrofera Blue Ready Foam, 2.5 x2.5 in 2 x Per Week/30 Days ary Discharge Instructions: Apply to wound bed over the gentamicin ointment. Secondary Dressing: Woven Gauze Sponge, Non-Sterile 4x4 in 2 x Per Week/30 Days Discharge Instructions: Apply over primary dressing as directed. Com pression Wrap: ThreePress (3 layer compression wrap) 2 x Per Week/30 Days Discharge Instructions: Apply three layer compression as directed. 1. In office sharp debridement 2. Gentamicin and Hydrofera  Blue 3. 3 layer compression 4. Follow-up in 1 week Electronic Signature(s) Signed: 02/05/2021 9:05:14 AM By: Kalman Shan DO Entered By: Kalman Shan on 02/04/2021  15:25:32 -------------------------------------------------------------------------------- HxROS Details Patient Name: Date of Service: Stephanie Shaw 02/04/2021 1:45 PM Medical Record Number: [id number] Patient Account Number: [id number] Date of Birth/Sex: Treating RN: March 04, 1940 (71 y.o. Stephanie Shaw Primary Care Provider: Veleta Miners Other Clinician: Referring Provider: Treating Provider/Extender: Cleda Daub in Treatment: 0 Information Obtained From Patient Chart Eyes Medical History: Positive for: Cataracts - removed Negative for: Glaucoma; Optic Neuritis Ear/Nose/Mouth/Throat Medical History: Negative for: Chronic sinus problems/congestion; Middle ear problems Hematologic/Lymphatic Medical History: Positive for: Anemia Negative for: Hemophilia; Human Immunodeficiency Virus; Lymphedema; Sickle Cell Disease Respiratory Medical History: Negative for: Aspiration; Asthma; Chronic Obstructive Pulmonary Disease (COPD); Pneumothorax; Sleep Apnea; Tuberculosis Cardiovascular Medical History: Positive for: Arrhythmia - A. Fib; Hypotension; Peripheral Venous Disease Negative for: Angina; Congestive Heart Failure; Coronary Artery Disease; Deep Vein Thrombosis; Hypertension; Myocardial Infarction; Peripheral Arterial Disease; Phlebitis; Vasculitis Gastrointestinal Medical History: Negative for: Cirrhosis ; Colitis; Crohns; Hepatitis A; Hepatitis B; Hepatitis C Endocrine Medical History: Negative for: Type I Diabetes; Type II Diabetes Genitourinary Medical History: Negative for: End Stage Renal Disease Past Medical History Notes: Frequent UTI Immunological Medical History: Negative for: Lupus Erythematosus; Raynauds; Scleroderma Past Medical History Notes: Parkinson's Disease Integumentary (Skin) Medical History: Negative for: History of Burn Musculoskeletal Medical History: Positive for: Osteoarthritis - hips and knees Negative for:  Gout; Rheumatoid Arthritis; Osteomyelitis Past Medical History Notes: DJD Neurologic Medical History: Negative for: Dementia; Neuropathy; Quadriplegia; Paraplegia; Seizure Disorder Oncologic Medical History: Negative for: Received Chemotherapy; Received Radiation Psychiatric Medical History: Negative for: Anorexia/bulimia; Confinement Anxiety HBO Extended History Items Eyes: Cataracts Immunizations Pneumococcal Vaccine: Received Pneumococcal Vaccination: Yes Received Pneumococcal Vaccination On or After 60th Birthday: Yes Implantable Devices No devices added Hospitalization / Surgery History Type of Hospitalization/Surgery Left breast biopsy Family and Social History Cancer: Yes - Mother; Diabetes: Yes - Mother; Heart Disease: Yes - Mother; Hereditary Spherocytosis: No; Hypertension: Yes - Mother; Kidney Disease: Yes - Father; Lung Disease: No; Seizures: No; Stroke: Yes - Siblings; Thyroid Problems: No; Tuberculosis: No; Never smoker; Marital Status - Divorced; Alcohol Use: Never; Drug Use: No History; Caffeine Use: Never; Financial Concerns: No; Food, Clothing or Shelter Needs: No; Support System Lacking: No; Transportation Concerns: No Electronic Signature(s) Signed: 02/05/2021 9:05:14 AM By: Kalman Shan DO Signed: 02/05/2021 3:58:04 PM By: Lorrin Jackson Entered By: Kalman Shan on 02/04/2021 15:22:39 -------------------------------------------------------------------------------- SuperBill Details Patient Name: Date of Service: Stephanie Shaw 02/04/2021 Medical Record Number: [id number] Patient Account Number: [id number] Date of Birth/Sex: Treating RN: 1940/11/13 (45 y.o. Stephanie Shaw Primary Care Provider: Veleta Miners Other Clinician: Referring Provider: Treating Provider/Extender: Kathie Rhodes Weeks in Treatment: 0 Diagnosis Coding ICD-10 Codes Code Description [phone] Non-pressure chronic ulcer of other part of left lower leg  with fat layer exposed I87.2 Venous insufficiency (chronic) (peripheral) I89.0 Lymphedema, not elsewhere classified G20 Parkinson's disease I48.91 Unspecified atrial fibrillation Facility Procedures CPT4 Code: [id number] Description: 11042 - DEB SUBQ TISSUE 20 SQ CM/< ICD-10 Diagnosis Description L97.822 Non-pressure chronic ulcer of other part of left lower leg with fat layer expo I87.2 Venous insufficiency (chronic) (peripheral) Modifier: sed Quantity: 1 Physician Procedures : CPT4 Code Description Modifier [id number] - WC PHYS SUBQ TISS 20 SQ CM ICD-10 Diagnosis Description L97.822 Non-pressure chronic ulcer of other part of left lower leg with fat layer exposed I87.2 Venous insufficiency (chronic) (peripheral) Quantity: 1 Electronic  Signature(s) Signed: 02/05/2021 9:05:14 AM By: Kalman Shan DO Entered By: Kalman Shan on 02/04/2021 15:25:47

## 2021-02-08 ENCOUNTER — Non-Acute Institutional Stay (SKILLED_NURSING_FACILITY): Payer: Medicare PPO | Admitting: Nurse Practitioner

## 2021-02-08 DIAGNOSIS — R600 Localized edema: Secondary | ICD-10-CM

## 2021-02-08 DIAGNOSIS — K219 Gastro-esophageal reflux disease without esophagitis: Secondary | ICD-10-CM | POA: Diagnosis not present

## 2021-02-08 DIAGNOSIS — I739 Peripheral vascular disease, unspecified: Secondary | ICD-10-CM | POA: Diagnosis not present

## 2021-02-08 DIAGNOSIS — M159 Polyosteoarthritis, unspecified: Secondary | ICD-10-CM

## 2021-02-08 DIAGNOSIS — I480 Paroxysmal atrial fibrillation: Secondary | ICD-10-CM | POA: Diagnosis not present

## 2021-02-08 DIAGNOSIS — D500 Iron deficiency anemia secondary to blood loss (chronic): Secondary | ICD-10-CM | POA: Diagnosis not present

## 2021-02-08 DIAGNOSIS — G903 Multi-system degeneration of the autonomic nervous system: Secondary | ICD-10-CM

## 2021-02-08 DIAGNOSIS — G20 Parkinson's disease: Secondary | ICD-10-CM | POA: Diagnosis not present

## 2021-02-08 DIAGNOSIS — K5901 Slow transit constipation: Secondary | ICD-10-CM

## 2021-02-08 DIAGNOSIS — N3941 Urge incontinence: Secondary | ICD-10-CM

## 2021-02-08 DIAGNOSIS — N3000 Acute cystitis without hematuria: Secondary | ICD-10-CM | POA: Diagnosis not present

## 2021-02-08 NOTE — Assessment & Plan Note (Addendum)
Urology ordered urine culture showed K. Pneumoniae >100,000c/ml, susceptible to Cipro, Cipro 500mg  bid x7 days if Urology agrees. 02/08/21 called to Urology: ordering physician is not available, covering urology recommended to treat if the patient is symptomatic. The patient stated: on and off dysuria, urinary frequency, incontinent of urine.

## 2021-02-08 NOTE — Progress Notes (Addendum)
Location:   SNF [place] Room Number: 13 Place of Service:  SNF (31) Provider: [facility] Antavius Sperbeck NP  [name], MD  Patient Care Team: [name], MD as PCP - General (Internal Medicine) [name], [name], MD as PCP - Cardiology (Cardiology) [name], [name], DO as Consulting Physician (Neurology) Izel Eisenhardt X, NP as Nurse Practitioner (Internal Medicine)  Extended Emergency Contact Information Primary Emergency Contact: Cola, Gane Mobile Phone: [phone] Relation: Daughter Interpreter needed? No Secondary Emergency Contact: [facility] Address: [address]          [place], [place] [address] [name] of [place] Phone: [phone] Mobile Phone: [phone] Relation: Son  Code Status: DNR Goals of care: Advanced Directive information Advanced Directives 01/18/2021  Does Patient Have a Medical Advance Directive? Yes  Type of Paramedic of [place];Out of facility DNR (pink MOST or yellow form)  Does patient want to make changes to medical advance directive? No - Patient declined  Copy of [place] in Chart? Yes - validated most recent copy scanned in chart (See row information)  Would patient like information on creating a medical advance directive? -  Pre-existing out of facility DNR order (yellow form or pink MOST form) -     Chief Complaint  Patient presents with   Acute Visit    UTI    HPI:  Pt is a 81 y.o. female seen today for an acute visit for UTI, urine culture showed K. Pneumoniae >100,000c/ml, susceptible to Cipro, Cipro 568m bid x7 days started subsequently.  The patient stated: on and off dysuria, urinary frequency, incontinent of urine.    Slow healing skin tear anterior left shin above the ankle, f/u wound care center evaluation             PAD 12/25/20 ABI R+L 0.7, moderate arterial disease, vascular specialist if desires.              GERD, takes Protonix             The patient  has chronic edema BLE. Off Metolazone 09/27/19. Echo EF normal in the past.              Afib, heart rate is in control, on Xarelto, off Diltiazem. Hgb 11.8 11/22/20, TSH 1.30 03/29/20             OA,  on Tylenol, Methocarbamol 2560mqd.    Parkinson's, symptoms improved, on Sinemet, Comtan[facility]              Orthostatic hypotension, improved, takes Midodrine.              Anemia, stable, Hgb 11.8 11/22/20,  takes Fe             Urge incontinent of urine, takes Mirabegron.              Constipation, takes MiraLax Past Medical History:  Diagnosis Date   Acute lower UTI 09/14/2018   Anemia 10/15/2018   2016 colonoscopy 10/14/18 wbc 5.4, Hgb 10.1, plt 184 12/01/18 wbc 4.5, Hgb 11.1, plt 188, neutrophils 63,  Na 139, K 3.7, Bun 23, creat 0.69, eGFR 83 on Fe, Hgb 11.7 12/09/18    Atrial fibrillation (HCC[place]ol[place]5/01/2010   10/07/18 Na 143, K 4.0, Bun 20, creat 0.79, eGFR 72, wbc 7.1, Hgb 10.9, plt 172 10/28/18 Na 142, K 4.0, Bun 19, creat 0.77, eGFR 74 11/18/18 Na  144, K 4.1, Bun 21, creat 0.69, eGFR 83   Bilateral lower extremity edema 07/19/2018   11/02/18 BMP 2 weeks.    Cervical pain (neck) 06/20/2010   Closed fracture of part of upper end of humerus 05/01/2015   Colles' fracture of right radius 03/05/2015   Constipation 07/19/2018   DEGENERATIVE JOINT DISEASE, RIGHT HIP 02/16/2007   Dupuytren's contracture    Dysuria 09/20/2018   09/20/18 c/o got up several times last night to go urinate, burning on urination, lower abd /back discomfort, but urinary frequency, leakage are not new. UA C/S, Pyridium 173m tid x 2 days.  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/27/2017   S/P breast biopsy, left    two o'clock position - benign   Toxic effect of venom(989.5) 07/27/2007   Tremor, unspecified 10/19/2015   Unstable gait 02/16/2017   Vaginal atrophy 10/19/2015   Venous insufficiency    Weakness 09/12/2018   Past Surgical History:  Procedure Laterality Date   BREAST BIOPSY Left    CATARACT EXTRACTION, BILATERAL      Allergies  Allergen Reactions   Doxycycline Nausea And Vomiting   Sulfonamide Derivatives     REACTION: HIVES    Allergies as of 02/08/2021       Reactions   Doxycycline Nausea And Vomiting   Sulfonamide Derivatives    REACTION: HIVES        Medication List        Accurate as of February 08, 2021 11:59 PM. If you have any questions, ask your nurse or doctor.          acetaminophen 500 MG tablet Commonly known as: TYLENOL Take 1,000 mg by mouth 3 (three) times daily as needed.   acetaminophen 500 MG tablet Commonly known as: TYLENOL Take 1,000 mg by mouth at bedtime. administer at 9:30pm per resident request DO NOT EXCEED 30075mIN A 24 HOUR PERIOD   ARTIFICIAL TEARS OP Place 1 drop into both eyes 3 (three) times daily.   calcium carbonate 750 MG chewable tablet Commonly known as: TUMS EX Chew 1 tablet by mouth daily. Between the hours of 7am-10am.   carbidopa-levodopa 25-100 MG tablet Commonly known as: SINEMET IR Take 2 tablets by mouth 5 (five) times daily.   carbidopa-levodopa 50-200 MG tablet Commonly known as: SINEMET CR Take 1 tablet by mouth at bedtime.   carbidopa-levodopa 25-100 MG tablet Commonly known as: SINEMET IR Take 1 tablet by mouth once as needed. May give one additional tablet daily in addition to scheduled dose as needed for increased symptoms related to Parkinson's. For taking at 11am if needed. Do not take  after 1130 or within one hour of next dose.   Cranberry 450 MG Tabs Take 1 tablet by mouth daily.   diclofenac Sodium 1 % Gel Commonly known as: VOLTAREN Apply topically 3 (three) times daily as needed.   entacapone 200 MG tablet Commonly known as: COMTAN Take 200 mg by mouth 3 (three) times daily.   ferrous sulfate 325 (65  FE) MG tablet Take 325 mg by mouth every Monday, Wednesday, and Friday. Give with food   hydrocortisone 25 MG suppository Commonly known as: ANUSOL-HC Place 25 mg rectally daily as needed for hemorrhoids or anal itching.   methocarbamol 500 MG tablet Commonly known as: ROBAXIN Take 250 mg by mouth daily. As needed   midodrine 5 MG tablet Commonly known as: PROAMATINE Take 5 mg by mouth 3 (three) times daily with meals.   mirabegron ER 50 MG Tb24 tablet Commonly known as: MYRBETRIQ Take 50 mg by mouth daily.   NUTRITIONAL SUPPLEMENT PO Take by mouth daily in the afternoon. BOOST   pantoprazole 40 MG tablet Commonly known as: PROTONIX Take 40 mg by mouth daily.   polyethylene glycol 17 g packet Commonly known as: MIRALAX / GLYCOLAX Take 17 g by mouth daily.   PreviDent 5000 Booster Plus 1.1 % Pste Generic drug: Sodium Fluoride Place 1 application onto teeth in the morning and at bedtime.   rivaroxaban 20 MG Tabs tablet Commonly known as: XARELTO Take 20 mg by mouth every evening. Between the hours of 5pm-6pm.   Saline 3 % Aers Place 1 spray into the nose 2 (two) times daily.   simethicone 80 MG chewable tablet Commonly known as: MYLICON Chew 80 mg by mouth 3 (three) times daily.   Vitamin D3 10 MCG (400 UNIT) Caps Take 1 capsule by mouth daily. Between the hours of 7am-10am.   zinc oxide 20 % ointment Apply 1 application topically as needed for irritation. To buttocks after every incontinent episode and as needed for redness. May keep at bedside.        Review of Systems  Constitutional:  Negative for appetite change, fatigue and  fever.  HENT:  Positive for hearing loss. Negative for congestion, nosebleeds and trouble swallowing.   Eyes:  Positive for visual disturbance.       C/o double vision  Respiratory:  Negative for shortness of breath.   Cardiovascular:  Positive for leg swelling.  Gastrointestinal:  Negative for abdominal pain and constipation.       Suprapubic region discomfort.   Genitourinary:  Positive for dysuria, frequency and urgency. Negative for hematuria.       Incontinent of urine, c/o discomfort upon urination.   Musculoskeletal:  Positive for arthralgias, back pain and gait problem. Negative for myalgias.       Lower back discomfort positional. Chronic shoulder pain L>R, full ROM  Skin:  Positive for wound.  Neurological:  Positive for tremors. Negative for speech difficulty and headaches.       Moves slow, fine tremor in fingers, burning sensation  in the R+ L great toes when touched by sheets, comes/goes  Psychiatric/Behavioral:  Negative for behavioral problems and sleep disturbance. The patient is nervous/anxious.        Sleeps from 9pm to 4-5am usually, feels anxious sometimes.    Immunization History  Administered Date(s) Administered   Influenza Split 10/20/2012   Influenza Whole 10/21/2006   Influenza, High Dose Seasonal PF 10/17/2016, 11/02/2019   Influenza-Unspecified 10/20/2013, 10/22/2017, 11/07/2020   Moderna Sars-Covid-2 Vaccination 01/22/2019, 02/19/2019, 11/29/2019, 06/19/2020   Pfizer Covid-19 Vaccine Bivalent Booster 73yr & up 10/10/2020   Pneumococcal Conjugate-13 01/31/2013   Pneumococcal Polysaccharide-23 01/20/2005, 11/24/2011   Td 01/21/2004   Tdap 03/16/2014   Zoster, Live 04/17/2009   Pertinent  Health Maintenance Due  Topic Date Due   INFLUENZA VACCINE  Completed   DEXA SCAN  Completed   COLONOSCOPY (Pts [phone]  coverage will need to be confirmed)  Discontinued   Fall Risk 09/11/2018 09/12/2018 09/12/2018 09/13/2018 09/14/2018  Falls in  the past year? - - - - -  Was there an injury with Fall? - - - - -  Fall Risk Category Calculator - - - - -  Fall Risk Category - - - - -  Patient Fall Risk Level High fall risk High fall risk High fall risk High fall risk High fall risk  Patient at Risk for Falls Due to - - - - -  Fall risk Follow up - - - - -   Functional Status Survey:    Vitals:   02/08/21 1234  BP: 118/68  Pulse: 72  Resp: 18  Temp: 98 F (36.7 C)  SpO2: 94%   There is no height or weight on file to calculate BMI. Physical Exam Vitals and nursing note reviewed.  Constitutional:      Appearance: Normal appearance.  HENT:     Head: Normocephalic and atraumatic.     Mouth/Throat:     Mouth: Mucous membranes are moist.  Eyes:     Extraocular Movements: Extraocular movements intact.     Conjunctiva/sclera: Conjunctivae normal.     Pupils: Pupils are equal, round, and reactive to light.     Comments: Chronic double vision, see Ophthalmology 09/20/20  Cardiovascular:     Rate and Rhythm: Normal rate. Rhythm irregular.     Heart sounds: No murmur heard.    Comments: DP pulses present L>R Pulmonary:     Effort: Pulmonary effort is normal.     Breath sounds: No rales.  Abdominal:     General: Bowel sounds are normal.     Palpations: Abdomen is soft.     Tenderness: There is no abdominal tenderness. There is no right CVA tenderness, left CVA tenderness, guarding or rebound.  Musculoskeletal:        General: Tenderness present.     Cervical back: Normal range of motion and neck supple.     Right lower leg: Edema present.     Left lower leg: Edema present.     Comments: trace edema LLE, 1+ RLE. Chronic lower back, shoulder R+L pain.   Skin:    General: Skin is warm and dry.     Comments: About 1x2 cm slow healing wound from previous skin tear, no depth, redness, warmth, tenderness, or drainage. ABI showed moderate PAD  Neurological:     General: No focal deficit present.     Mental Status: She is alert  and oriented to person, place, and time. Mental status is at baseline.     Coordination: Coordination abnormal.     Gait: Gait abnormal.     Comments: Moves slow, tremor in hands. No facial or limb weakness.   Psychiatric:        Mood and Affect: Mood normal.        Behavior: Behavior normal.    Labs reviewed: Recent Labs    03/29/20 0000 05/23/20 0000 10/12/20 0000  NA 141 139 141  K 4.5 4.3 4.2  CL 107 105 109*  CO2 23* 27* 27*  BUN 17 23* 20  CREATININE 0.8 0.7 0.7  CALCIUM 9.4 9.7 9.0   Recent Labs    03/29/20 0000 05/23/20 0000 10/12/20 0000  AST 10* 10* 11*  ALT 7 3* 5*  ALKPHOS 61 60 53  ALBUMIN 3.8 4.2 3.6   Recent Labs    05/23/20 0000 10/12/20 0000 11/22/20 0000  WBC 7.4 4.1 6.0  NEUTROABS 6,083.00 2,817.00 4,620.00  HGB 12.4 11.0* 11.8*  HCT 38 35* 36  PLT 174 160 181   Lab Results  Component Value Date   TSH 1.30 03/29/2020   No results found for: HGBA1C Lab Results  Component Value Date   CHOL 185 03/24/2019   HDL 79 (A) 03/24/2019   LDLCALC 94 03/24/2019   LDLDIRECT 104.2 01/25/2013   TRIG 41 03/24/2019   CHOLHDL 2 08/29/2015    Significant Diagnostic Results in last 30 days:  No results found.  Assessment/Plan: UTI (urinary tract infection) Urology ordered urine culture showed K. Pneumoniae >100,000c/ml, susceptible to Cipro, Cipro 541m bid x7 days if Urology agrees. 02/08/21 called to Urology: ordering physician is not available, covering urology recommended to treat if the patient is symptomatic. The patient stated: on and off dysuria, urinary frequency, incontinent of urine.   PAD (peripheral artery disease) (HCC) Slow healing skin tear anterior left shin above the ankle, f/u wound care center evaluation PAD 12/25/20 ABI R+L 0.7, moderate arterial disease, vascular specialist if desires.   GERD (gastroesophageal reflux disease) Stable, continue Protonix.   Bilateral lower extremity edema  The patient has chronic edema BLE. Off  Metolazone 09/27/19. Echo EF normal in the past.   Atrial fibrillation (HCC) heart rate is in control, on Xarelto, off Diltiazem. Hgb 11.8 11/22/20, TSH 1.30 03/29/20  Osteoarthritis, multiple sites on Tylenol, Methocarbamol 2580mqd.     Parkinsonism (HCWheeler on Sinemet, Comtan, [facility]   Hypotension  improved, takes Midodrine.   Anemia  stable, Hgb 11.8 11/22/20,  takes Fe  Urge incontinence of urine Chronic,  takes Mirabegron.   Slow transit constipation Stable, continue MiraLax.     Family/ staff Communication: plan of care reviewed with the patient and charge nurse.   Labs/tests ordered:  none  Time spend 35 minutes.

## 2021-02-11 ENCOUNTER — Encounter (HOSPITAL_BASED_OUTPATIENT_CLINIC_OR_DEPARTMENT_OTHER): Payer: Medicare PPO | Admitting: Internal Medicine

## 2021-02-11 ENCOUNTER — Other Ambulatory Visit: Payer: Self-pay

## 2021-02-11 ENCOUNTER — Encounter: Payer: Self-pay | Admitting: Nurse Practitioner

## 2021-02-11 DIAGNOSIS — M1990 Unspecified osteoarthritis, unspecified site: Secondary | ICD-10-CM | POA: Diagnosis not present

## 2021-02-11 DIAGNOSIS — L97822 Non-pressure chronic ulcer of other part of left lower leg with fat layer exposed: Secondary | ICD-10-CM | POA: Diagnosis not present

## 2021-02-11 DIAGNOSIS — I890 Lymphedema, not elsewhere classified: Secondary | ICD-10-CM

## 2021-02-11 DIAGNOSIS — G20 Parkinson's disease: Secondary | ICD-10-CM | POA: Diagnosis not present

## 2021-02-11 DIAGNOSIS — I872 Venous insufficiency (chronic) (peripheral): Secondary | ICD-10-CM

## 2021-02-11 DIAGNOSIS — I4891 Unspecified atrial fibrillation: Secondary | ICD-10-CM | POA: Diagnosis not present

## 2021-02-11 NOTE — Assessment & Plan Note (Signed)
Chronic,  takes Mirabegron.

## 2021-02-11 NOTE — Progress Notes (Signed)
[name], [name] ([id number]) Visit Report for 02/11/2021 Arrival Information Details Patient Name: Date of Service: Stephanie Shaw, Stephanie Shaw 02/11/2021 9:00 A M Medical Record Number: [id number] Patient Account Number: [id number] Date of Birth/Sex: Treating RN: 11/12/1940 (81 y.o. Sue Lush Primary Care Alyus Mofield: Veleta Miners Other Clinician: Referring Kyonna Frier: Treating Sol Englert/Extender: Cleda Daub in Treatment: 1 Visit Information History Since Last Visit Added or deleted any medications: No Patient Arrived: Wheel Chair Any new allergies or adverse reactions: No Arrival Time: 09:11 Had a fall or experienced change in No Transfer Assistance: Manual activities of daily living that may affect Patient Identification Verified: Yes risk of falls: Secondary Verification Process Completed: Yes Signs or symptoms of abuse/neglect since last visito No Patient Requires Transmission-Based Precautions: No Hospitalized since last visit: No Patient Has Alerts: No Implantable device outside of the clinic excluding No cellular tissue based products placed in the center since last visit: Has Dressing in Place as Prescribed: Yes Has Compression in Place as Prescribed: Yes Pain Present Now: No Electronic Signature(s) Signed: 02/11/2021 3:38:08 PM By: Lorrin Jackson Entered By: Lorrin Jackson on 02/11/2021 09:19:32 -------------------------------------------------------------------------------- Encounter Discharge Information Details Patient Name: Date of Service: Stephanie Shaw 02/11/2021 9:00 A M Medical Record Number: [id number] Patient Account Number: [id number] Date of Birth/Sex: Treating RN: 1941/01/12 (81 y.o. Sue Lush Primary Care Lismary Kiehn: Veleta Miners Other Clinician: Referring Django Nguyen: Treating Saraia Platner/Extender: Cleda Daub in Treatment: 1 Encounter Discharge Information Items Post Procedure Vitals Discharge  Condition: Stable Temperature (F): 97.7 Ambulatory Status: Wheelchair Pulse (bpm): 75 Discharge Destination: [place] Respiratory Rate (breaths/min): 18 Orders Sent: Yes Blood Pressure (mmHg): 110/70 Transportation: Other Schedule Follow-up Appointment: Yes Clinical Summary of Care: Provided on 02/11/2021 Form Type Recipient Paper Patient Patient Electronic Signature(s) Signed: 02/11/2021 3:38:08 PM By: Lorrin Jackson Entered By: Lorrin Jackson on 02/11/2021 10:14:47 -------------------------------------------------------------------------------- Lower Extremity Assessment Details Patient Name: Date of Service: Stephanie Shaw 02/11/2021 9:00 A M Medical Record Number: [id number] Patient Account Number: [id number] Date of Birth/Sex: Treating RN: [birth year] (81 y.o. Sue Lush Primary Care Bethenny Losee: Veleta Miners Other Clinician: Referring Willie Plain: Treating Tayleigh Wetherell/Extender: Kathie Rhodes Weeks in Treatment: 1 Edema Assessment Assessed: Shirlyn Goltz: Yes] [Right: No] Edema: [Left: Ye] [Right: s] Calf Left: Right: Point of Measurement: 30 cm From Medial Instep 36 cm Ankle Left: Right: Point of Measurement: 10 cm From Medial Instep 19 cm Vascular Assessment Pulses: Dorsalis Pedis Palpable: [Left:Yes] Electronic Signature(s) Signed: 02/11/2021 3:38:08 PM By: Lorrin Jackson Entered By: Lorrin Jackson on 02/11/2021 09:22:38 -------------------------------------------------------------------------------- Multi Wound Chart Details Patient Name: Date of Service: Stephanie Shaw 02/11/2021 9:00 A M Medical Record Number: [id number] Patient Account Number: [id number] Date of Birth/Sex: Treating RN: 1940-03-29 (81 y.o. Sue Lush Primary Care Asuka Dusseau: Veleta Miners Other Clinician: Referring Jennette Leask: Treating Hye Trawick/Extender: Kathie Rhodes Weeks in Treatment: 1 Vital Signs Height(in): 68 Pulse(bpm):  75 Weight(lbs): 132 Blood Pressure(mmHg): 110/70 Body Mass Index(BMI): 20.1 Temperature(F): 97.7 Respiratory Rate(breaths/min): 18 Photos: [1:Left, Anterior Lower Leg] [N/A:N/A N/A] Wound Location: [1:Gradually Appeared] [N/A:N/A] Wounding Event: [1:Lymphedema] [N/A:N/A] Primary Etiology: [1:Cataracts, Anemia, Arrhythmia,] [N/A:N/A] Comorbid History: [1:Hypotension, Peripheral Venous Disease, Osteoarthritis 08/20/2020] [N/A:N/A] Date Acquired: [1:1] [N/A:N/A] Weeks of Treatment: [1:Open] [N/A:N/A] Wound Status: [1:No] [N/A:N/A] Wound Recurrence: [1:Yes] [N/A:N/A] Clustered Wound: [1:4] [N/A:N/A] Clustered Quantity: [1:0.3x0.4x0.1] [N/A:N/A] Measurements L x W x D (cm) [1:0.094] [N/A:N/A] A (cm) : rea [1:0.009] [N/A:N/A] Volume (cm) : [1:95.80%] [N/A:N/A] % Reduction in A [1:rea: 96.00%] [N/A:N/A] % Reduction  in Volume: [1:Full Thickness Without Exposed] [N/A:N/A] Classification: [1:Support Structures Medium] [N/A:N/A] Exudate A mount: [1:Serosanguineous] [N/A:N/A] Exudate Type: [1:red, brown] [N/A:N/A] Exudate Color: [1:Distinct, outline attached] [N/A:N/A] Wound Margin: [1:Large (67-100%)] [N/A:N/A] Granulation A mount: [1:Red, Pink] [N/A:N/A] Granulation Quality: [1:None Present (0%)] [N/A:N/A] Necrotic A mount: [1:Fat Layer (Subcutaneous Tissue): Yes N/A] Exposed Structures: [1:Fascia: No Tendon: No Muscle: No Joint: No Bone: No Large (67-100%)] [N/A:N/A] Epithelialization: [1:Debridement - Excisional] [N/A:N/A] Debridement: Pre-procedure Verification/Time Out 09:47 [N/A:N/A] Taken: [1:Other] [N/A:N/A] Pain Control: [1:Subcutaneous] [N/A:N/A] Tissue Debrided: [1:Skin/Subcutaneous Tissue] [N/A:N/A] Level: [1:0.12] [N/A:N/A] Debridement A (sq cm): [1:rea Curette] [N/A:N/A] Instrument: [1:Minimum] [N/A:N/A] Bleeding: [1:Pressure] [N/A:N/A] Hemostasis A chieved: [1:Procedure was tolerated well] [N/A:N/A] Debridement Treatment Response: [1:0.3x0.4x0.1] [N/A:N/A] Post  Debridement Measurements L x W x D (cm) [1:0.009] [N/A:N/A] Post Debridement Volume: (cm) [1:Debridement] [N/A:N/A] Treatment Notes Electronic Signature(s) Signed: 02/11/2021 10:12:49 AM By: Kalman Shan DO Signed: 02/11/2021 3:38:08 PM By: Lorrin Jackson Entered By: Kalman Shan on 02/11/2021 10:04:29 -------------------------------------------------------------------------------- Multi-Disciplinary Care Plan Details Patient Name: Date of Service: Stephanie Shaw 02/11/2021 9:00 A M Medical Record Number: [id number] Patient Account Number: [id number] Date of Birth/Sex: Treating RN: February 07, 1940 (81 y.o. Sue Lush Primary Care Domenique Southers: Veleta Miners Other Clinician: Referring Heitor Steinhoff: Treating Xiara Knisley/Extender: Cleda Daub in Treatment: 1 Active Inactive Abuse / Safety / Falls / Self Care Management Nursing Diagnoses: Potential for falls Goals: Patient will remain injury free related to falls Date Initiated: 01/29/2021 Target Resolution Date: 02/22/2021 Goal Status: Active Interventions: Assess self care needs on admission and as needed Provide education on fall prevention Notes: Orientation to the Wound Care Program Nursing Diagnoses: Knowledge deficit related to the wound healing center program Goals: Patient/caregiver will verbalize understanding of the [facility] Program Date Initiated: 01/29/2021 Target Resolution Date: 02/21/2021 Goal Status: Active Interventions: Provide education on orientation to the wound center Notes: Pain, Acute or Chronic Nursing Diagnoses: Pain, acute or chronic: actual or potential Potential alteration in comfort, pain Goals: Patient will verbalize adequate pain control and receive pain control interventions during procedures as needed Date Initiated: 01/29/2021 Target Resolution Date: 02/22/2021 Goal Status: Active Patient/caregiver will verbalize comfort level met Date Initiated:  01/29/2021 Target Resolution Date: 02/21/2021 Goal Status: Active Interventions: Encourage patient to take pain medications as prescribed Provide education on pain management Reposition patient for comfort Treatment Activities: Administer pain control measures as ordered : 01/29/2021 Notes: Wound/Skin Impairment Nursing Diagnoses: Knowledge deficit related to ulceration/compromised skin integrity Goals: Patient/caregiver will verbalize understanding of skin care regimen Date Initiated: 01/29/2021 Target Resolution Date: 02/21/2021 Goal Status: Active Interventions: Assess patient/caregiver ability to perform ulcer/skin care regimen upon admission and as needed Assess ulceration(s) every visit Provide education on ulcer and skin care Treatment Activities: Skin care regimen initiated : 01/29/2021 Topical wound management initiated : 01/29/2021 Notes: Electronic Signature(s) Signed: 02/11/2021 3:38:08 PM By: Lorrin Jackson Entered By: Lorrin Jackson on 02/11/2021 09:10:59 -------------------------------------------------------------------------------- Pain Assessment Details Patient Name: Date of Service: Stephanie Shaw 02/11/2021 9:00 A M Medical Record Number: [id number] Patient Account Number: [id number] Date of Birth/Sex: Treating RN: 19-Dec-1940 (81 y.o. Sue Lush Primary Care Montasia Chisenhall: Veleta Miners Other Clinician: Referring Imran Nuon: Treating Makala Fetterolf/Extender: Kathie Rhodes Weeks in Treatment: 1 Active Problems Location of Pain Severity and Description of Pain Patient Has Paino No Site Locations Pain Management and Medication Current Pain Management: Electronic Signature(s) Signed: 02/11/2021 3:38:08 PM By: Lorrin Jackson Entered By: Lorrin Jackson on 02/11/2021 09:20:18 -------------------------------------------------------------------------------- Patient/Caregiver Education Details Patient Name: Date of Service: Adria Dill,  [place] LKER G.  1/23/2023andnbsp9:00 A M Medical Record Number: [id number] Patient Account Number: [id number] Date of Birth/Gender: Treating RN: 1940/03/06 (81 y.o. Sue Lush Primary Care Physician: Veleta Miners Other Clinician: Referring Physician: Treating Physician/Extender: Cleda Daub in Treatment: 1 Education Assessment Education Provided To: Patient and Caregiver Education Topics Provided Venous: Methods: Explain/Verbal, Printed Responses: State content correctly Wound/Skin Impairment: Methods: Explain/Verbal, Printed Responses: State content correctly Electronic Signature(s) Signed: 02/11/2021 3:38:08 PM By: Lorrin Jackson Entered By: Lorrin Jackson on 02/11/2021 09:11:22 -------------------------------------------------------------------------------- Wound Assessment Details Patient Name: Date of Service: Stephanie Shaw 02/11/2021 9:00 A M Medical Record Number: [id number] Patient Account Number: [id number] Date of Birth/Sex: Treating RN: [birth year] (81 y.o. Sue Lush Primary Care Aarian Cleaver: Veleta Miners Other Clinician: Referring Caragh Gasper: Treating Romesha Scherer/Extender: Kathie Rhodes Weeks in Treatment: 1 Wound Status Wound Number: 1 Primary Lymphedema Etiology: Wound Location: Left, Anterior Lower Leg Wound Open Wounding Event: Gradually Appeared Status: Date Acquired: 08/20/2020 Comorbid Cataracts, Anemia, Arrhythmia, Hypotension, Peripheral Venous Weeks Of Treatment: 1 History: Disease, Osteoarthritis Clustered Wound: Yes Photos Wound Measurements Length: (cm) 0.3 Width: (cm) 0.4 Depth: (cm) 0.1 Clustered Quantity: 4 Area: (cm) 0.094 Volume: (cm) 0.009 % Reduction in Area: 95.8% % Reduction in Volume: 96% Epithelialization: Large (67-100%) Tunneling: No Undermining: No Wound Description Classification: Full Thickness Without Exposed Support Structures Wound Margin: Distinct, outline attached Exudate  Amount: Medium Exudate Type: Serosanguineous Exudate Color: red, brown Foul Odor After Cleansing: No Slough/Fibrino No Wound Bed Granulation Amount: Large (67-100%) Exposed Structure Granulation Quality: Red, Pink Fascia Exposed: No Necrotic Amount: None Present (0%) Fat Layer (Subcutaneous Tissue) Exposed: Yes Tendon Exposed: No Muscle Exposed: No Joint Exposed: No Bone Exposed: No Treatment Notes Wound #1 (Lower Leg) Wound Laterality: Left, Anterior Cleanser Soap and Water Discharge Instruction: May shower and wash wound with dial antibacterial soap and water prior to dressing change. Wound Cleanser Discharge Instruction: Cleanse the wound with wound cleanser prior to applying a clean dressing using gauze sponges, not tissue or cotton balls. Peri-Wound Care Sween Lotion (Moisturizing lotion) Discharge Instruction: Apply moisturizing lotion as directed Topical Gentamicin Discharge Instruction: Apply directly to wound bed under the primary dressing. Primary Dressing Hydrofera Blue Ready Foam, 2.5 x2.5 in Discharge Instruction: Apply to wound bed over the gentamicin ointment. Secondary Dressing Zetuvit Plus Silicone Border Dressing 4x4 (in/in) Discharge Instruction: Or equivalent silicone foam border Secured With Compression Wrap Compression Stockings Add-Ons Electronic Signature(s) Signed: 02/11/2021 9:36:00 AM By: Lorrin Jackson Entered By: Lorrin Jackson on 02/11/2021 09:36:00 -------------------------------------------------------------------------------- Vitals Details Patient Name: Date of Service: Stephanie Shaw 02/11/2021 9:00 A M Medical Record Number: [id number] Patient Account Number: [id number] Date of Birth/Sex: Treating RN: 06-04-1940 (81 y.o. Sue Lush Primary Care Kiley Solimine: Veleta Miners Other Clinician: Referring Oniel Meleski: Treating Dalisa Forrer/Extender: Cleda Daub in Treatment: 1 Vital Signs Time Taken:  09:19 Temperature (F): 97.7 Height (in): 68 Pulse (bpm): 75 Weight (lbs): 132 Respiratory Rate (breaths/min): 18 Body Mass Index (BMI): 20.1 Blood Pressure (mmHg): 110/70 Reference Range: 80 - 120 mg / dl Electronic Signature(s) Signed: 02/11/2021 3:38:08 PM By: Lorrin Jackson Entered By: Lorrin Jackson on 02/11/2021 09:19:53

## 2021-02-11 NOTE — Assessment & Plan Note (Signed)
on Sinemet, Comtan, f/u neurology at [facility]. double vision, chronic, saw Ophthalmology

## 2021-02-11 NOTE — Assessment & Plan Note (Signed)
improved, takes Midodrine.

## 2021-02-11 NOTE — Assessment & Plan Note (Signed)
The patient has chronic edema BLE. Off Metolazone 09/27/19. Echo EF normal in the past.  

## 2021-02-11 NOTE — Progress Notes (Signed)
Stephanie Shaw, Stephanie Shaw ([id number]) Visit Report for 02/11/2021 Chief Complaint Document Details Patient Name: Date of Service: Stephanie Shaw, Stephanie Shaw 02/11/2021 9:00 A M Medical Record Number: [id number] Patient Account Number: [id number] Date of Birth/Sex: Treating RN: Apr 01, 1940 (81 y.o. Sue Lush Primary Care Provider: Veleta Miners Other Clinician: Referring Provider: Treating Provider/Extender: Cleda Daub in Treatment: 1 Information Obtained from: Patient Chief Complaint Left lower extremity wound following trauma Electronic Signature(s) Signed: 02/11/2021 10:12:49 AM By: Kalman Shan DO Entered By: Kalman Shan on 02/11/2021 10:04:36 -------------------------------------------------------------------------------- Debridement Details Patient Name: Date of Service: Stephanie Shaw 02/11/2021 9:00 A M Medical Record Number: [id number] Patient Account Number: [id number] Date of Birth/Sex: Treating RN: 04-Mar-1940 (81 y.o. Sue Lush Primary Care Provider: Veleta Miners Other Clinician: Referring Provider: Treating Provider/Extender: Kathie Rhodes Weeks in Treatment: 1 Debridement Performed for Assessment: Wound #1 Left,Anterior Lower Leg Performed By: Physician Kalman Shan, DO Debridement Type: Debridement Level of Consciousness (Pre-procedure): Awake and Alert Pre-procedure Verification/Time Out Yes - 09:47 Taken: Start Time: 09:48 Pain Control: Other : Benzocaine T Area Debrided (L x W): otal 0.3 (cm) x 0.4 (cm) = 0.12 (cm) Tissue and other material debrided: Non-Viable, Subcutaneous Level: Skin/Subcutaneous Tissue Debridement Description: Excisional Instrument: Curette Bleeding: Minimum Hemostasis Achieved: Pressure End Time: 09:52 Response to Treatment: Procedure was tolerated well Level of Consciousness (Post- Awake and Alert procedure): Post Debridement Measurements of Total Wound Length: (cm)  0.3 Width: (cm) 0.4 Depth: (cm) 0.1 Volume: (cm) 0.009 Character of Wound/Ulcer Post Debridement: Stable Post Procedure Diagnosis Same as Pre-procedure Electronic Signature(s) Signed: 02/11/2021 10:12:49 AM By: Kalman Shan DO Signed: 02/11/2021 3:38:08 PM By: Lorrin Jackson Entered By: Lorrin Jackson on 02/11/2021 09:52:39 -------------------------------------------------------------------------------- HPI Details Patient Name: Date of Service: Stephanie Shaw 02/11/2021 9:00 A M Medical Record Number: [id number] Patient Account Number: [id number] Date of Birth/Sex: Treating RN: [birth year] (81 y.o. Sue Lush Primary Care Provider: Veleta Miners Other Clinician: Referring Provider: Treating Provider/Extender: Kathie Rhodes Weeks in Treatment: 1 History of Present Illness HPI Description: Admission 01/29/2021 Stephanie Shaw is an 81 year old female with a past medical history of A. fib, chronic venous insufficiency and Parkinson's that presents to the clinic for 4- 1/59-month history of nonhealing wound to the anterior aspect of the left lower extremity. She states that on September 10, 2020 her compression stockings were being replaced by the CNA at her facility and she developed a skin tear. She states that overall the wound is stable with slight improvement in wound healing for the past several months. She has been using Xeroform to the wound bed. She wears her compression stockings every other day. She currently denies signs of infection. 1/16; patient presents for follow-up. She tolerated the compression wrap well. She has no issues or complaints today. She denies signs of infection. She reports improvement in wound healing. 1/23; patient presents for follow-up. She reports pain to the anterior shin of her left lower extremity. She has a bruise to the lateral aspect. She states that the CNA's leg had come in contact with her leg while assisting her  creating the bruise. Only the wound area was dressed with Hydrofera Blue along with Kerlix Coban. The entire leg was not in a compression wrap. She currently denies signs of infection. Electronic Signature(s) Signed: 02/11/2021 10:12:49 AM By: Kalman Shan DO Entered By: Kalman Shan on 02/11/2021 10:06:39 -------------------------------------------------------------------------------- Physical Exam Details Patient Name: Date of Service: Stephanie Shaw 02/11/2021 9:00 A M  Medical Record Number: [id number] Patient Account Number: [id number] Date of Birth/Sex: Treating RN: September 16, 1940 (81 y.o. Sue Lush Primary Care Provider: Veleta Miners Other Clinician: Referring Provider: Treating Provider/Extender: Kathie Rhodes Weeks in Treatment: 1 Constitutional respirations regular, non-labored and within target range for patient.. Cardiovascular 2+ dorsalis pedis/posterior tibialis pulses. Psychiatric pleasant and cooperative. Notes Left lower extremity: T the anterior aspect there are 2 small open wounds with nonviable tissue present and post debridement there is granulation tissue. No o surrounding signs of infection. 2+ pitting edema to the knee. The entire anterior shin is sore. There is a large bruise to the lateral aspect. Electronic Signature(s) Signed: 02/11/2021 10:12:49 AM By: Kalman Shan DO Entered By: Kalman Shan on 02/11/2021 10:07:39 -------------------------------------------------------------------------------- Physician Orders Details Patient Name: Date of Service: Stephanie Shaw 02/11/2021 9:00 A M Medical Record Number: [id number] Patient Account Number: [id number] Date of Birth/Sex: Treating RN: Jun 22, 1940 (81 y.o. Sue Lush Primary Care Provider: Veleta Miners Other Clinician: Referring Provider: Treating Provider/Extender: Cleda Daub in Treatment: 1 Verbal / Phone Orders:  No Diagnosis Coding ICD-10 Coding Code Description [phone] Non-pressure chronic ulcer of other part of left lower leg with fat layer exposed I87.2 Venous insufficiency (chronic) (peripheral) I89.0 Lymphedema, not elsewhere classified G20 Parkinson's disease I48.91 Unspecified atrial fibrillation Follow-up Appointments ppointment in 1 week. - Dr. Heber [place] Return A **No compression wrap this week due to patient c/o pain, but use compression stocking daily** Bathing/ Shower/ Hygiene May shower with protection but do not get wound dressing(s) wet. - when not changing the dressing use a cast protector. May shower and wash wound with soap and water. - Facility to remove compression wrap, wash wound with soap and water on Friday shower days. Edema Control - Lymphedema / SCD / Other Elevate legs to the level of the heart or above for 30 minutes daily and/or when sitting, a frequency of: - 3-4 times a throughout the day. Avoid standing for long periods of time. Patient to wear own compression stockings every day. - Bilateral legs- Apply in the morning and remove at night. Exercise regularly Moisturize legs daily. - right leg every night before bed. Additional Orders / Instructions Follow Nutritious Diet Wound Treatment Wound #1 - Lower Leg Wound Laterality: Left, Anterior Cleanser: Soap and Water Every Other Day/30 Days Discharge Instructions: May shower and wash wound with dial antibacterial soap and water prior to dressing change. Cleanser: Wound Cleanser Every Other Day/30 Days Discharge Instructions: Cleanse the wound with wound cleanser prior to applying a clean dressing using gauze sponges, not tissue or cotton balls. Peri-Wound Care: Sween Lotion (Moisturizing lotion) Every Other Day/30 Days Discharge Instructions: Apply moisturizing lotion as directed Topical: Gentamicin Every Other Day/30 Days Discharge Instructions: Apply directly to wound bed under the primary dressing. Prim  Dressing: Hydrofera Blue Ready Foam, 2.5 x2.5 in Every Other Day/30 Days ary Discharge Instructions: Apply to wound bed over the gentamicin ointment. Secondary Dressing: Zetuvit Plus Silicone Border Dressing 4x4 (in/in) Every Other Day/30 Days Discharge Instructions: Or equivalent silicone foam border Electronic Signature(s) Signed: 02/11/2021 10:12:49 AM By: Kalman Shan DO Signed: 02/11/2021 10:12:49 AM By: Kalman Shan DO Entered By: Kalman Shan on 02/11/2021 10:07:59 -------------------------------------------------------------------------------- Problem List Details Patient Name: Date of Service: Stephanie Shaw 02/11/2021 9:00 A M Medical Record Number: [id number] Patient Account Number: [id number] Date of Birth/Sex: Treating RN: 01/28/1940 (81 y.o. 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/10/2021 No Yes I87.2 Venous insufficiency (chronic) (peripheral) 01/29/2021 No Yes I89.0 Lymphedema, not elsewhere classified 01/29/2021 No Yes G20 Parkinson's disease 01/29/2021 No Yes I48.91 Unspecified atrial fibrillation 01/29/2021 No Yes Inactive Problems Resolved Problems Electronic Signature(s) Signed: 02/11/2021 10:12:49 AM By: Kalman Shan DO Entered By: Kalman Shan on 02/11/2021 10:04:24 -------------------------------------------------------------------------------- Progress Note Details Patient Name: Date of Service: Stephanie Shaw 02/11/2021 9:00 A M Medical Record Number: [id number] Patient Account Number: [id number] Date of Birth/Sex: Treating RN: [birth year] (81 y.o. Sue Lush Primary Care Provider: Veleta Miners Other Clinician: Referring  Provider: Treating Provider/Extender: Kathie Rhodes Weeks in Treatment: 1 Subjective Chief Complaint Information obtained from Patient Left lower extremity wound following trauma History of Present Illness (HPI) Admission 01/29/2021 Ms. Stephanie Shaw is an 81 year old female with a past medical history of A. fib, chronic venous insufficiency and Parkinson's that presents to the clinic for 4- 1/47-month history of nonhealing wound to the anterior aspect of the left lower extremity. She states that on September 10, 2020 her compression stockings were being replaced by the CNA at her facility and she developed a skin tear. She states that overall the wound is stable with slight improvement in wound healing for the past several months. She has been using Xeroform to the wound bed. She wears her compression stockings every other day. She currently denies signs of infection. 1/16; patient presents for follow-up. She tolerated the compression wrap well. She has no issues or complaints today. She denies signs of infection. She reports improvement in wound healing. 1/23; patient presents for follow-up. She reports pain to the anterior shin of her left lower extremity. She has a bruise to the lateral aspect. She states that the CNA's leg had come in contact with her leg while assisting her creating the bruise. Only the wound area was dressed with Hydrofera Blue along with Kerlix Coban. The entire leg was not in a compression wrap. She currently denies signs of infection. Patient History Information obtained from Patient, Chart. Family History Cancer - Mother, Diabetes - Mother, Heart Disease - Mother, Hypertension - Mother, Kidney Disease - Father, Stroke - Siblings, No family history of Hereditary Spherocytosis, Lung Disease, Seizures, Thyroid Problems, Tuberculosis. Social History Never smoker, Marital Status - Divorced, Alcohol Use - Never, Drug Use - No History, Caffeine Use -  Never. Medical History Eyes Patient has history of Cataracts - removed Denies history of Glaucoma, Optic Neuritis Ear/Nose/Mouth/Throat Denies history of Chronic sinus problems/congestion, Middle ear problems Hematologic/Lymphatic Patient has history of Anemia Denies history of Hemophilia, Human Immunodeficiency Virus, Lymphedema, Sickle Cell Disease Respiratory Denies history of Aspiration, Asthma, Chronic Obstructive Pulmonary Disease (COPD), Pneumothorax, Sleep Apnea, Tuberculosis Cardiovascular Patient has history of Arrhythmia - A. Fib, Hypotension, Peripheral Venous Disease Denies history of Angina, Congestive Heart Failure, Coronary Artery Disease, Deep Vein Thrombosis, Hypertension, Myocardial Infarction, Peripheral Arterial Disease, Phlebitis, Vasculitis Gastrointestinal Denies history of Cirrhosis , Colitis, Crohnoos, Hepatitis A, Hepatitis B, Hepatitis C Endocrine Denies history of Type I Diabetes, Type II Diabetes Genitourinary Denies history of End Stage Renal Disease Immunological Denies history of Lupus Erythematosus, Raynaudoos, Scleroderma Integumentary (Skin) Denies history of History of Burn Musculoskeletal Patient has history of Osteoarthritis - hips and knees Denies history of Gout, Rheumatoid Arthritis, Osteomyelitis Neurologic Denies history of Dementia, Neuropathy, Quadriplegia, Paraplegia, Seizure Disorder Oncologic Denies history  of Received Chemotherapy, Received Radiation Psychiatric Denies history of Anorexia/bulimia, Confinement Anxiety Hospitalization/Surgery History - Left breast biopsy. Medical A Surgical History Notes nd Genitourinary Frequent UTI Immunological Parkinson's Disease Musculoskeletal DJD Objective Constitutional respirations regular, non-labored and within target range for patient.. Vitals Time Taken: 9:19 AM, Height: 68 in, Weight: 132 lbs, BMI: 20.1, Temperature: 97.7 F, Pulse: 75 bpm, Respiratory Rate: 18  breaths/min, Blood Pressure: 110/70 mmHg. Cardiovascular 2+ dorsalis pedis/posterior tibialis pulses. Psychiatric pleasant and cooperative. General Notes: Left lower extremity: T the anterior aspect there are 2 small open wounds with nonviable tissue present and post debridement there is o granulation tissue. No surrounding signs of infection. 2+ pitting edema to the knee. The entire anterior shin is sore. There is a large bruise to the lateral aspect. Integumentary (Hair, Skin) Wound #1 status is Open. Original cause of wound was Gradually Appeared. The date acquired was: 08/20/2020. The wound has been in treatment 1 weeks. The wound is located on the Left,Anterior Lower Leg. The wound measures 0.3cm length x 0.4cm width x 0.1cm depth; 0.094cm^2 area and 0.009cm^3 volume. There is Fat Layer (Subcutaneous Tissue) exposed. There is no tunneling or undermining noted. There is a medium amount of serosanguineous drainage noted. The wound margin is distinct with the outline attached to the wound base. There is large (67-100%) red, pink granulation within the wound bed. There is no necrotic tissue within the wound bed. Assessment Active Problems ICD-10 Non-pressure chronic ulcer of other part of left lower leg with fat layer exposed Venous insufficiency (chronic) (peripheral) Lymphedema, not elsewhere classified Parkinson's disease Unspecified atrial fibrillation Patient's wounds have shown improvement in size and appearance since last clinic visit. I debrided nonviable tissue. I recommended continuing with Hydrofera Blue to the wound bed. It appears that she has experienced some sort of trauma to her left lower extremity since she has a large bruise to the lateral aspect and soreness throughout the anterior shin. She denies falling. I do not think she needs an image at this time however I recommended that if symptoms got worse she should bring this up to her facility. I will not put her in  compression wrap this week so she can keep an eye on the leg. I did recommend compression stockings daily. Procedures Wound #1 Pre-procedure diagnosis of Wound #1 is a Lymphedema located on the Left,Anterior Lower Leg . There was a Excisional Skin/Subcutaneous Tissue Debridement with a total area of 0.12 sq cm performed by Kalman Shan, DO. With the following instrument(s): Curette to remove Non-Viable tissue/material. Material removed includes Subcutaneous Tissue after achieving pain control using Other (Benzocaine). No specimens were taken. A time out was conducted at 09:47, prior to the start of the procedure. A Minimum amount of bleeding was controlled with Pressure. The procedure was tolerated well. Post Debridement Measurements: 0.3cm length x 0.4cm width x 0.1cm depth; 0.009cm^3 volume. Character of Wound/Ulcer Post Debridement is stable. Post procedure Diagnosis Wound #1: Same as Pre-Procedure Plan Follow-up Appointments: Return Appointment in 1 week. - Dr. Heber [place] **No compression wrap this week due to patient c/o pain, but use compression stocking daily** Bathing/ Shower/ Hygiene: May shower with protection but do not get wound dressing(s) wet. - when not changing the dressing use a cast protector. May shower and wash wound with soap and water. - Facility to remove compression wrap, wash wound with soap and water on Friday shower days. Edema Control - Lymphedema / SCD / Other: Elevate legs to the level of the heart or above  for 30 minutes daily and/or when sitting, a frequency of: - 3-4 times a throughout the day. Avoid standing for long periods of time. Patient to wear own compression stockings every day. - Bilateral legs- Apply in the morning and remove at night. Exercise regularly Moisturize legs daily. - right leg every night before bed. Additional Orders / Instructions: Follow Nutritious Diet WOUND #1: - Lower Leg Wound Laterality: Left, Anterior Cleanser: Soap and Water  Every Other Day/30 Days Discharge Instructions: May shower and wash wound with dial antibacterial soap and water prior to dressing change. Cleanser: Wound Cleanser Every Other Day/30 Days Discharge Instructions: Cleanse the wound with wound cleanser prior to applying a clean dressing using gauze sponges, not tissue or cotton balls. Peri-Wound Care: Sween Lotion (Moisturizing lotion) Every Other Day/30 Days Discharge Instructions: Apply moisturizing lotion as directed Topical: Gentamicin Every Other Day/30 Days Discharge Instructions: Apply directly to wound bed under the primary dressing. Prim Dressing: Hydrofera Blue Ready Foam, 2.5 x2.5 in Every Other Day/30 Days ary Discharge Instructions: Apply to wound bed over the gentamicin ointment. Secondary Dressing: Zetuvit Plus Silicone Border Dressing 4x4 (in/in) Every Other Day/30 Days Discharge Instructions: Or equivalent silicone foam border 1. In office sharp debridement 2. Hydrofera Blue 3. Compression stockings daily 4. Follow-up in 1 week Electronic Signature(s) Signed: 02/11/2021 10:12:49 AM By: Kalman Shan DO Entered By: Kalman Shan on 02/11/2021 10:11:58 -------------------------------------------------------------------------------- HxROS Details Patient Name: Date of Service: Stephanie Shaw 02/11/2021 9:00 A M Medical Record Number: [id number] Patient Account Number: [id number] Date of Birth/Sex: Treating RN: 03/01/1940 (81 y.o. Sue Lush Primary Care Provider: Veleta Miners Other Clinician: Referring Provider: Treating Provider/Extender: Cleda Daub in Treatment: 1 Information Obtained From Patient Chart Eyes Medical History: Positive for: Cataracts - removed Negative for: Glaucoma; Optic Neuritis Ear/Nose/Mouth/Throat Medical History: Negative for: Chronic sinus problems/congestion; Middle ear problems Hematologic/Lymphatic Medical History: Positive for:  Anemia Negative for: Hemophilia; Human Immunodeficiency Virus; Lymphedema; Sickle Cell Disease Respiratory Medical History: Negative for: Aspiration; Asthma; Chronic Obstructive Pulmonary Disease (COPD); Pneumothorax; Sleep Apnea; Tuberculosis Cardiovascular Medical History: Positive for: Arrhythmia - A. Fib; Hypotension; Peripheral Venous Disease Negative for: Angina; Congestive Heart Failure; Coronary Artery Disease; Deep Vein Thrombosis; Hypertension; Myocardial Infarction; Peripheral Arterial Disease; Phlebitis; Vasculitis Gastrointestinal Medical History: Negative for: Cirrhosis ; Colitis; Crohns; Hepatitis A; Hepatitis B; Hepatitis C Endocrine Medical History: Negative for: Type I Diabetes; Type II Diabetes Genitourinary Medical History: Negative for: End Stage Renal Disease Past Medical History Notes: Frequent UTI Immunological Medical History: Negative for: Lupus Erythematosus; Raynauds; Scleroderma Past Medical History Notes: Parkinson's Disease Integumentary (Skin) Medical History: Negative for: History of Burn Musculoskeletal Medical History: Positive for: Osteoarthritis - hips and knees Negative for: Gout; Rheumatoid Arthritis; Osteomyelitis Past Medical History Notes: DJD Neurologic Medical History: Negative for: Dementia; Neuropathy; Quadriplegia; Paraplegia; Seizure Disorder Oncologic Medical History: Negative for: Received Chemotherapy; Received Radiation Psychiatric Medical History: Negative for: Anorexia/bulimia; Confinement Anxiety HBO Extended History Items Eyes: Cataracts Immunizations Pneumococcal Vaccine: Received Pneumococcal Vaccination: Yes Received Pneumococcal Vaccination On or After 60th Birthday: Yes Implantable Devices No devices added Hospitalization / Surgery History Type of Hospitalization/Surgery Left breast biopsy Family and Social History Cancer: Yes - Mother; Diabetes: Yes - Mother; Heart Disease: Yes - Mother; Hereditary  Spherocytosis: No; Hypertension: Yes - Mother; Kidney Disease: Yes - Father; Lung Disease: No; Seizures: No; Stroke: Yes - Siblings; Thyroid Problems: No; Tuberculosis: No; Never smoker; Marital Status - Divorced; Alcohol Use: Never; Drug Use: No History; Caffeine Use: Never; Financial Concerns:  No; Food, Clothing or Shelter Needs: No; Support System Lacking: No; Transportation Concerns: No Electronic Signature(s) Signed: 02/11/2021 10:12:49 AM By: Kalman Shan DO Signed: 02/11/2021 3:38:08 PM By: Lorrin Jackson Entered By: Kalman Shan on 02/11/2021 10:06:47 -------------------------------------------------------------------------------- SuperBill Details Patient Name: Date of Service: Stephanie Shaw 02/11/2021 Medical Record Number: [id number] Patient Account Number: [id number] Date of Birth/Sex: Treating RN: August 22, 1940 (81 y.o. Sue Lush Primary Care Provider: Veleta Miners Other Clinician: Referring Provider: Treating Provider/Extender: Kathie Rhodes Weeks in Treatment: 1 Diagnosis Coding ICD-10 Codes Code Description [phone] Non-pressure chronic ulcer of other part of left lower leg with fat layer exposed I87.2 Venous insufficiency (chronic) (peripheral) I89.0 Lymphedema, not elsewhere classified G20 Parkinson's disease I48.91 Unspecified atrial fibrillation Facility Procedures CPT4 Code: [id number] Description: 38333 - DEB SUBQ TISSUE 20 SQ CM/< ICD-10 Diagnosis Description L97.822 Non-pressure chronic ulcer of other part of left lower leg with fat layer expo I87.2 Venous insufficiency (chronic) (peripheral) I89.0 Lymphedema, not elsewhere  classified G20 Parkinson's disease Modifier: sed Quantity: 1 Physician Procedures : CPT4 Code Description Modifier [id number] - WC PHYS SUBQ TISS 20 SQ CM ICD-10 Diagnosis Description L97.822 Non-pressure chronic ulcer of other part of left lower leg with fat layer exposed I87.2 Venous insufficiency  (chronic) (peripheral) I89.0  Lymphedema, not elsewhere classified G20 Parkinson's disease Quantity: 1 Electronic Signature(s) Signed: 02/11/2021 10:12:49 AM By: Kalman Shan DO Entered By: Kalman Shan on 02/11/2021 10:12:19

## 2021-02-11 NOTE — Assessment & Plan Note (Signed)
Slow healing skin tear anterior left shin above the ankle, f/u wound care center evaluation PAD 12/25/20 ABI R+L 0.7, moderate arterial disease, vascular specialist if desires.

## 2021-02-11 NOTE — Assessment & Plan Note (Signed)
Stable, continue Protonix 

## 2021-02-11 NOTE — Assessment & Plan Note (Signed)
stable, Hgb 11.8 11/22/20,  takes Fe 

## 2021-02-11 NOTE — Assessment & Plan Note (Signed)
Stable, continue MiraLax.  °

## 2021-02-11 NOTE — Assessment & Plan Note (Signed)
heart rate is in control, on Xarelto, off Diltiazem. Hgb 11.8 11/22/20, TSH 1.30 03/29/20 

## 2021-02-11 NOTE — Assessment & Plan Note (Signed)
on Tylenol, Methocarbamol 250mg  qd.

## 2021-02-12 ENCOUNTER — Non-Acute Institutional Stay (SKILLED_NURSING_FACILITY): Payer: Medicare PPO | Admitting: Internal Medicine

## 2021-02-12 ENCOUNTER — Encounter: Payer: Self-pay | Admitting: Internal Medicine

## 2021-02-12 DIAGNOSIS — G20 Parkinson's disease: Secondary | ICD-10-CM

## 2021-02-12 DIAGNOSIS — K5901 Slow transit constipation: Secondary | ICD-10-CM | POA: Diagnosis not present

## 2021-02-12 DIAGNOSIS — I480 Paroxysmal atrial fibrillation: Secondary | ICD-10-CM

## 2021-02-12 DIAGNOSIS — N390 Urinary tract infection, site not specified: Secondary | ICD-10-CM

## 2021-02-12 DIAGNOSIS — G903 Multi-system degeneration of the autonomic nervous system: Secondary | ICD-10-CM | POA: Diagnosis not present

## 2021-02-12 DIAGNOSIS — R600 Localized edema: Secondary | ICD-10-CM | POA: Diagnosis not present

## 2021-02-12 DIAGNOSIS — K219 Gastro-esophageal reflux disease without esophagitis: Secondary | ICD-10-CM | POA: Diagnosis not present

## 2021-02-12 NOTE — Progress Notes (Signed)
Location:   [facility] Room Number: [place] of Service:  SNF [phone]) Provider:  Veleta Miners MD  [name], MD  Patient Care Team: [name], MD as PCP - General (Internal Medicine) [name], [name], MD as PCP - Cardiology (Cardiology) [name], [name], DO as Consulting Physician (Neurology) Mast, Man X, NP as Nurse Practitioner (Internal Medicine)  Extended Emergency Contact Information Primary Emergency Contact: [place], [place] Mobile Phone: [phone] Relation: Daughter Interpreter needed? No Secondary Emergency Contact: [facility] Address: [address]          [place], [place] [address] [name] of [place] Phone: [phone] Mobile Phone: [phone] Relation: Son  Code Status:  DNR Managed Care Goals of care: Advanced Directive information Advanced Directives 02/12/2021  Does Patient Have a Medical Advance Directive? Yes  Type of Paramedic of [place];Living will;Out of facility DNR (pink MOST or yellow form)  Does patient want to make changes to medical advance directive? No - Patient declined  Copy of [place] in Chart? Yes - validated most recent copy scanned in chart (See row information)  Would patient like information on creating a medical advance directive? -  Pre-existing out of facility DNR order (yellow form or pink MOST form) -     Chief Complaint  Patient presents with   Medical Management of Chronic Issues   Quality Metric Gaps    Shingrix    HPI:  Pt is a 81 y.o. female seen today for medical management of chronic diseases.    Patient has a history of Parkinson disease diagnosed 2018  Paroxysmal A. fib on Xarelto, hyperlipidemia, H/o Ovarian Cyst, Lower extremity edema,2 D echo with N EF Biatrial enlargement   Urinary Incontinence  Hypotension  Sees Dr Buck Mam in [place]  Now Have Non Healing Wound in Left  LE Seeing Wound care clinic Say she had more pain in that leg for past few days.  Per Wound care no Wrapping for now Also has recurrent UTIs. Seen by urology I dont have notes from them Parkinson Continues to stay stable Walks very slowly with her [place] and Assist Need assist with Transfers Difficulty feeding also  Wt Readings from Last 3 Encounters:  02/12/21 132 lb 12.8 oz (60.2 kg)  01/18/21 132 lb 12.8 oz (60.2 kg)  12/24/20 131 lb 3.2 oz (59.5 kg)     Past Medical History:  Diagnosis Date   Acute lower UTI 09/14/2018   Anemia 10/15/2018   2016 colonoscopy 10/14/18 wbc 5.4, Hgb 10.1, plt 184 12/01/18 wbc 4.5, Hgb 11.1, plt 188, neutrophils 63,  Na 139, K 3.7, Bun 23, creat 0.69, eGFR 83 on Fe, Hgb 11.7 12/09/18    Atrial fibrillation ([place]) 05/21/2010   10/07/18 Na 143, K 4.0, Bun 20, creat 0.79, eGFR 72, wbc 7.1, Hgb 10.9, plt 172 10/28/18 Na 142, K 4.0, Bun 19, creat 0.77, eGFR 74 11/18/18 Na 144, K 4.1, Bun 21, creat 0.69, eGFR 83   Bilateral lower extremity edema 07/19/2018   11/02/18 BMP 2 weeks.    Cervical pain (neck) 06/20/2010   Closed fracture of part of upper end of humerus 05/01/2015   Colles' fracture of right radius 03/05/2015   Constipation 07/19/2018   DEGENERATIVE JOINT DISEASE, RIGHT HIP 02/16/2007   Dupuytren's contracture    Dysuria 09/20/2018   09/20/18 c/o got up several times last night to go urinate, burning on urination, lower abd /  back discomfort, but urinary frequency, leakage are not new. UA C/S, Pyridium 172m tid x 2 days.  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   S/P breast biopsy, left    two o'clock position - benign   Toxic effect of venom(989.5) 07/27/2007   Tremor, unspecified 10/19/2015   Unstable gait 02/16/2017   Vaginal atrophy 10/19/2015   Venous insufficiency    Weakness 09/12/2018   Past Surgical History:  Procedure Laterality Date   BREAST BIOPSY Left    CATARACT EXTRACTION, BILATERAL      Allergies  Allergen Reactions   Doxycycline Nausea And Vomiting   Sulfonamide Derivatives     REACTION: HIVES    Allergies as of 02/12/2021       Reactions   Doxycycline Nausea And Vomiting   Sulfonamide Derivatives    REACTION: HIVES        Medication List        Accurate as of February 12, 2021 11:08 AM. If you have any questions, ask your nurse or doctor.          acetaminophen 500 MG tablet Commonly known as: TYLENOL Take 1,000 mg by mouth 3 (three) times daily as needed.   acetaminophen 500 MG tablet Commonly known as: TYLENOL Take 1,000 mg by mouth at bedtime. administer at 9:30pm per resident request DO NOT EXCEED 30073mIN A 24 HOUR PERIOD   ARTIFICIAL TEARS OP Place 1 drop into both eyes 3 (three) times daily.   calcium carbonate 750 MG chewable tablet Commonly known as: TUMS EX Chew 1 tablet by mouth daily. Between the hours of 7am-10am.   carbidopa-levodopa 25-100 MG tablet Commonly known as: SINEMET IR Take 2 tablets by mouth 5 (five) times daily.   carbidopa-levodopa 50-200 MG tablet Commonly known as: SINEMET CR Take 1 tablet by mouth at bedtime.   carbidopa-levodopa 25-100 MG tablet Commonly known as: SINEMET IR Take 1 tablet by mouth once as needed. May give one additional tablet daily in addition to scheduled dose as needed for increased symptoms related to Parkinson's. For taking at 11am if needed. Do not take after 1130 or within one hour of next dose.   ciprofloxacin 500 MG tablet Commonly known as: CIPRO Take 500 mg by mouth 2 (two) times  daily.   Cranberry 450 MG Tabs Take 1 tablet by mouth daily.   diclofenac Sodium 1 % Gel Commonly known as: VOLTAREN Apply topically 3 (three) times daily as needed.   entacapone 200 MG tablet Commonly known as: COMTAN Take 200 mg by mouth 3 (three) times daily.   ferrous sulfate 325 (65 FE) MG tablet Take 325 mg by mouth every Monday, Wednesday, and Friday. Give with food   gentamicin cream 0.1 % Commonly known as: GARAMYCIN Apply 1 application topically every other day.   hydrocortisone 25 MG suppository Commonly known as: ANUSOL-HC Place 25 mg rectally daily as needed for hemorrhoids or anal itching.   methocarbamol 500 MG tablet Commonly known as: ROBAXIN Take 250 mg by  mouth daily. As needed   midodrine 5 MG tablet Commonly known as: PROAMATINE Take 5 mg by mouth 3 (three) times daily with meals.   mirabegron ER 50 MG Tb24 tablet Commonly known as: MYRBETRIQ Take 50 mg by mouth daily.   NUTRITIONAL SUPPLEMENT PO Take by mouth daily in the afternoon. BOOST   pantoprazole 40 MG tablet Commonly known as: PROTONIX Take 40 mg by mouth daily.   polyethylene glycol 17 g packet Commonly known as: MIRALAX / GLYCOLAX Take 17 g by mouth daily.   PreviDent 5000 Booster Plus 1.1 % Pste Generic drug: Sodium Fluoride Place 1 application onto teeth in the morning and at bedtime.   rivaroxaban 20 MG Tabs tablet Commonly known as: XARELTO Take 20 mg by mouth every evening. Between the hours of 5pm-6pm.   saccharomyces boulardii 250 MG capsule Commonly known as: FLORASTOR Take 250 mg by mouth 2 (two) times daily.   Saline 3 % Aers Place 1 spray into the nose 2 (two) times daily.   simethicone 80 MG chewable tablet Commonly known as: MYLICON Chew 80 mg by mouth 3 (three) times daily.   Vitamin D3 10 MCG (400 UNIT) Caps Take 1 capsule by mouth daily. Between the hours of 7am-10am.   zinc oxide 20 % ointment Apply 1 application topically as needed for irritation.  To buttocks after every incontinent episode and as needed for redness. May keep at bedside.        Review of Systems  Constitutional:  Negative for activity change and appetite change.  HENT: Negative.    Respiratory:  Negative for cough and shortness of breath.   Cardiovascular:  Positive for leg swelling.  Gastrointestinal:  Negative for constipation.  Genitourinary:  Positive for frequency.  Musculoskeletal:  Positive for gait problem. Negative for arthralgias and myalgias.  Skin:  Positive for wound.  Neurological:  Negative for dizziness and weakness.  Psychiatric/Behavioral:  Negative for confusion, dysphoric mood and sleep disturbance.    Immunization History  Administered Date(s) Administered   Influenza Split 10/20/2012   Influenza Whole 10/21/2006   Influenza, High Dose Seasonal PF 10/17/2016, 11/02/2019   Influenza-Unspecified 10/20/2013, 10/22/2017, 11/07/2020   Moderna Sars-Covid-2 Vaccination 01/22/2019, 02/19/2019, 11/29/2019, 06/19/2020   Pfizer Covid-19 Vaccine Bivalent Booster 21yr & up 10/10/2020   Pneumococcal Conjugate-13 01/31/2013   Pneumococcal Polysaccharide-23 01/20/2005, 11/24/2011   Td 01/21/2004   Tdap 03/16/2014   Zoster, Live 04/17/2009   Pertinent  Health Maintenance Due  Topic Date Due   INFLUENZA VACCINE  Completed   DEXA SCAN  Completed   COLONOSCOPY (Pts [phone] coverage will need to be confirmed)  Discontinued   Fall Risk 09/11/2018 09/12/2018 09/12/2018 09/13/2018 09/14/2018  Falls in the past year? - - - - -  Was there an injury with Fall? - - - - -  Fall Risk Category Calculator - - - - -  Fall Risk Category - - - - -  Patient Fall Risk Level High fall risk High fall risk High fall risk High fall risk High fall risk  Patient at Risk for Falls Due to - - - - -  Fall risk Follow up - - - - -   Functional Status Survey:    Vitals:   02/12/21 1055  BP: 122/63  Pulse: 72  Resp: 18  Temp: 98.2 F (36.8 C)  SpO2: 96%   Weight: 132 lb 12.8 oz (60.2 kg)  Height: '5\' 8"'  (1.727 m)   Body mass index is 20.19 kg/m.  Physical Exam Vitals reviewed.  Constitutional:      Appearance: Normal appearance.  HENT:     Head: Normocephalic.     Nose: Nose normal.     Mouth/Throat:     Mouth: Mucous membranes are moist.     Pharynx: Oropharynx is clear.  Eyes:     Pupils: Pupils are equal, round, and reactive to light.  Cardiovascular:     Rate and Rhythm: Normal rate and regular rhythm.     Pulses: Normal pulses.     Heart sounds: Normal heart sounds. No murmur heard. Pulmonary:     Effort: Pulmonary effort is normal.     Breath sounds: Normal breath sounds.  Abdominal:     General: Abdomen is flat. Bowel sounds are normal.     Palpations: Abdomen is soft.  Musculoskeletal:        General: Swelling present.     Cervical back: Neck supple.  Skin:    General: Skin is warm.     Comments: Small Non Healing Pressure ulcer in LLE  Neurological:     General: No focal deficit present.     Mental Status: She is alert and oriented to person, place, and time.  Psychiatric:        Mood and Affect: Mood normal.        Thought Content: Thought content normal.    Labs reviewed: Recent Labs    03/29/20 0000 05/23/20 0000 10/12/20 0000  NA 141 139 141  K 4.5 4.3 4.2  CL 107 105 109*  CO2 23* 27* 27*  BUN 17 23* 20  CREATININE 0.8 0.7 0.7  CALCIUM 9.4 9.7 9.0   Recent Labs    03/29/20 0000 05/23/20 0000 10/12/20 0000  AST 10* 10* 11*  ALT 7 3* 5*  ALKPHOS 61 60 53  ALBUMIN 3.8 4.2 3.6   Recent Labs    05/23/20 0000 10/12/20 0000 11/22/20 0000  WBC 7.4 4.1 6.0  NEUTROABS 6,083.00 2,817.00 4,620.00  HGB 12.4 11.0* 11.8*  HCT 38 35* 36  PLT 174 160 181   Lab Results  Component Value Date   TSH 1.30 03/29/2020   No results found for: HGBA1C Lab Results  Component Value Date   CHOL 185 03/24/2019   HDL 79 (A) 03/24/2019   LDLCALC 94 03/24/2019   LDLDIRECT 104.2 01/25/2013   TRIG 41  03/24/2019   CHOLHDL 2 08/29/2015    Significant Diagnostic Results in last 30 days:  No results found.  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

## 2021-02-18 ENCOUNTER — Encounter (HOSPITAL_BASED_OUTPATIENT_CLINIC_OR_DEPARTMENT_OTHER): Payer: Medicare PPO | Admitting: Internal Medicine

## 2021-02-18 ENCOUNTER — Other Ambulatory Visit: Payer: Self-pay

## 2021-02-18 DIAGNOSIS — G20 Parkinson's disease: Secondary | ICD-10-CM | POA: Diagnosis not present

## 2021-02-18 DIAGNOSIS — I4891 Unspecified atrial fibrillation: Secondary | ICD-10-CM | POA: Diagnosis not present

## 2021-02-18 DIAGNOSIS — L97822 Non-pressure chronic ulcer of other part of left lower leg with fat layer exposed: Secondary | ICD-10-CM

## 2021-02-18 DIAGNOSIS — M1990 Unspecified osteoarthritis, unspecified site: Secondary | ICD-10-CM | POA: Diagnosis not present

## 2021-02-18 DIAGNOSIS — I890 Lymphedema, not elsewhere classified: Secondary | ICD-10-CM | POA: Diagnosis not present

## 2021-02-18 DIAGNOSIS — I872 Venous insufficiency (chronic) (peripheral): Secondary | ICD-10-CM

## 2021-02-18 NOTE — Progress Notes (Signed)
Stephanie Shaw, Stephanie Shaw ([id number]) Visit Report for 02/18/2021 Arrival Information Details Patient Name: Date of Service: Stephanie Shaw, Stephanie Shaw 02/18/2021 9:15 A M Medical Record Number: [id number] Patient Account Number: [id number] Date of Birth/Sex: Treating RN: 1940/06/18 (81 y.o. Elam Dutch Primary Care Myrlene Riera: Veleta Miners Other Clinician: Referring Zakayla Martinec: Treating Dheeraj Hail/Extender: Cleda Daub in Treatment: 2 Visit Information History Since Last Visit Added or deleted any medications: No Patient Arrived: Wheel Chair Any new allergies or adverse reactions: No Arrival Time: 09:32 Had a fall or experienced change in No Accompanied By: self activities of daily living that may affect Transfer Assistance: Manual risk of falls: Patient Identification Verified: Yes Signs or symptoms of abuse/neglect since last visito No Secondary Verification Process Completed: Yes Hospitalized since last visit: No Patient Requires Transmission-Based Precautions: No Implantable device outside of the clinic excluding No Patient Has Alerts: No cellular tissue based products placed in the center since last visit: Has Dressing in Place as Prescribed: Yes Has Compression in Place as Prescribed: Yes Pain Present Now: No Electronic Signature(s) Signed: 02/18/2021 5:09:42 PM By: Baruch Gouty RN, BSN Entered By: Baruch Gouty on 02/18/2021 09:32:42 -------------------------------------------------------------------------------- Clinic Level of Care Assessment Details Patient Name: Date of Service: Stephanie Shaw 02/18/2021 9:15 A M Medical Record Number: [id number] Patient Account Number: [id number] Date of Birth/Sex: Treating RN: 01-26-1940 (81 y.o. [name], [name] Primary Care Paetyn Pietrzak: Veleta Miners Other Clinician: Referring Kenly Xiao: Treating Syaire Saber/Extender: Cleda Daub in Treatment: 2 Clinic Level of Care Assessment  Items TOOL 4 Quantity Score []  - 0 Use when only an EandM is performed on FOLLOW-UP visit ASSESSMENTS - Nursing Assessment / Reassessment X- 1 10 Reassessment of Co-morbidities (includes updates in patient status) X- 1 5 Reassessment of Adherence to Treatment Plan ASSESSMENTS - Wound and Skin A ssessment / Reassessment X - Simple Wound Assessment / Reassessment - one wound 1 5 []  - 0 Complex Wound Assessment / Reassessment - multiple wounds []  - 0 Dermatologic / Skin Assessment (not related to wound area) ASSESSMENTS - Focused Assessment X- 1 5 Circumferential Edema Measurements - multi extremities []  - 0 Nutritional Assessment / Counseling / Intervention X- 1 5 Lower Extremity Assessment (monofilament, tuning fork, pulses) []  - 0 Peripheral Arterial Disease Assessment (using hand held doppler) ASSESSMENTS - Ostomy and/or Continence Assessment and Care []  - 0 Incontinence Assessment and Management []  - 0 Ostomy Care Assessment and Management (repouching, etc.) PROCESS - Coordination of Care X - Simple Patient / Family Education for ongoing care 1 15 []  - 0 Complex (extensive) Patient / Family Education for ongoing care X- 1 10 Staff obtains Programmer, systems, Records, T Results / Process Orders est X- 1 10 Staff telephones HHA, Nursing Homes / Clarify orders / etc []  - 0 Routine Transfer to another Facility (non-emergent condition) []  - 0 Routine Hospital Admission (non-emergent condition) []  - 0 New Admissions / Biomedical engineer / Ordering NPWT Apligraf, etc. , []  - 0 Emergency Hospital Admission (emergent condition) X- 1 10 Simple Discharge Coordination []  - 0 Complex (extensive) Discharge Coordination PROCESS - Special Needs []  - 0 Pediatric / Minor Patient Management []  - 0 Isolation Patient Management []  - 0 Hearing / Language / Visual special needs []  - 0 Assessment of Community assistance (transportation, D/C planning, etc.) []  - 0 Additional  assistance / Altered mentation []  - 0 Support Surface(s) Assessment (bed, cushion, seat, etc.) INTERVENTIONS - Wound Cleansing / Measurement X - Simple Wound Cleansing - one wound 1  5 []  - 0 Complex Wound Cleansing - multiple wounds X- 1 5 Wound Imaging (photographs - any number of wounds) []  - 0 Wound Tracing (instead of photographs) []  - 0 Simple Wound Measurement - one wound []  - 0 Complex Wound Measurement - multiple wounds INTERVENTIONS - Wound Dressings X - Small Wound Dressing one or multiple wounds 1 10 []  - 0 Medium Wound Dressing one or multiple wounds []  - 0 Large Wound Dressing one or multiple wounds []  - 0 Application of Medications - topical []  - 0 Application of Medications - injection INTERVENTIONS - Miscellaneous []  - 0 External ear exam []  - 0 Specimen Collection (cultures, biopsies, blood, body fluids, etc.) []  - 0 Specimen(s) / Culture(s) sent or taken to Lab for analysis []  - 0 Patient Transfer (multiple staff / Civil Service fast streamer / Similar devices) []  - 0 Simple Staple / Suture removal (25 or less) []  - 0 Complex Staple / Suture removal (26 or more) []  - 0 Hypo / Hyperglycemic Management (close monitor of Blood Glucose) []  - 0 Ankle / Brachial Index (ABI) - do not check if billed separately X- 1 5 Vital Signs Has the patient been seen at the hospital within the last three years: Yes Total Score: 100 Level Of Care: New/Established - Level 3 Electronic Signature(s) Signed: 02/18/2021 5:09:42 PM By: Baruch Gouty RN, BSN Entered By: Baruch Gouty on 02/18/2021 10:17:29 -------------------------------------------------------------------------------- Encounter Discharge Information Details Patient Name: Date of Service: Stephanie Shaw 02/18/2021 9:15 A M Medical Record Number: [id number] Patient Account Number: [id number] Date of Birth/Sex: Treating RN: Dec 30, 1940 (81 y.o. Elam Dutch Primary Care Quanah Majka: Veleta Miners Other  Clinician: Referring Rozanne Heumann: Treating Demontre Padin/Extender: Cleda Daub in Treatment: 2 Encounter Discharge Information Items Discharge Condition: Stable Ambulatory Status: Wheelchair Discharge Destination: [place] Telephoned: No Orders Sent: Yes Transportation: Other Accompanied By: self Schedule Follow-up Appointment: Yes Clinical Summary of Care: Patient Declined Notes facility transportation Electronic Signature(s) Signed: 02/18/2021 5:09:42 PM By: Baruch Gouty RN, BSN Entered By: Baruch Gouty on 02/18/2021 10:27:43 -------------------------------------------------------------------------------- Lower Extremity Assessment Details Patient Name: Date of Service: Stephanie Shaw 02/18/2021 9:15 A M Medical Record Number: [id number] Patient Account Number: [id number] Date of Birth/Sex: Treating RN: [birth year] (81 y.o. Elam Dutch Primary Care Graciemae Delisle: Veleta Miners Other Clinician: Referring India Jolin: Treating Vincente Asbridge/Extender: Kathie Rhodes Weeks in Treatment: 2 Edema Assessment Assessed: [Left: No] [Right: No] Edema: [Left: Ye] [Right: s] Calf Left: Right: Point of Measurement: 30 cm From Medial Instep 34 cm Ankle Left: Right: Point of Measurement: 10 cm From Medial Instep 22 cm Vascular Assessment Pulses: Dorsalis Pedis Palpable: [Left:Yes] Electronic Signature(s) Signed: 02/18/2021 5:09:42 PM By: Baruch Gouty RN, BSN Entered By: Baruch Gouty on 02/18/2021 09:38:16 -------------------------------------------------------------------------------- Multi Wound Chart Details Patient Name: Date of Service: Stephanie Shaw 02/18/2021 9:15 A M Medical Record Number: [id number] Patient Account Number: [id number] Date of Birth/Sex: Treating RN: 04-29-1940 (25 y.o. Stephanie Shaw Primary Care Carlis Blanchard: Veleta Miners Other Clinician: Referring Simon Llamas: Treating Yee Joss/Extender:  Kathie Rhodes Weeks in Treatment: 2 Vital Signs Height(in): 68 Pulse(bpm): 28 Weight(lbs): 132 Blood Pressure(mmHg): 113/72 Body Mass Index(BMI): 20.1 Temperature(F): 97.5 Respiratory Rate(breaths/min): 18 Photos: [N/A:N/A] Left, Anterior Lower Leg N/A N/A Wound Location: Gradually Appeared N/A N/A Wounding Event: Lymphedema N/A N/A Primary Etiology: Cataracts, Anemia, Arrhythmia, N/A N/A Comorbid History: Hypotension, Peripheral Venous Disease, Osteoarthritis 08/20/2020 N/A N/A Date Acquired: 2 N/A N/A Weeks of Treatment: Open N/A N/A Wound Status: No N/A  N/A Wound Recurrence: Yes N/A N/A Clustered Wound: 4 N/A N/A Clustered Quantity: 0x0x0 N/A N/A Measurements L x W x D (cm) 0 N/A N/A A (cm) : rea 0 N/A N/A Volume (cm) : 100.00% N/A N/A % Reduction in Area: 100.00% N/A N/A % Reduction in Volume: Full Thickness Without Exposed N/A N/A Classification: Support Structures None Present N/A N/A Exudate Amount: None Present (0%) N/A N/A Granulation Amount: None Present (0%) N/A N/A Necrotic Amount: Fascia: No N/A N/A Exposed Structures: Fat Layer (Subcutaneous Tissue): No Tendon: No Muscle: No Joint: No Bone: No Large (67-100%) N/A N/A Epithelialization: Treatment Notes Electronic Signature(s) Signed: 02/18/2021 10:36:23 AM By: Kalman Shan DO Signed: 02/18/2021 4:11:37 PM By: Lorrin Jackson Entered By: Kalman Shan on 02/18/2021 10:31:41 -------------------------------------------------------------------------------- Multi-Disciplinary Care Plan Details Patient Name: Date of Service: Stephanie Shaw 02/18/2021 9:15 A M Medical Record Number: [id number] Patient Account Number: [id number] Date of Birth/Sex: Treating RN: [birth year] (81 y.o. Elam Dutch Primary Care Franchesca Veneziano: Veleta Miners Other Clinician: Referring Anyia Gierke: Treating Amonie Wisser/Extender: Cleda Daub in Treatment:  2 [place] reviewed with physician Active Inactive Electronic Signature(s) Signed: 02/18/2021 5:09:42 PM By: Baruch Gouty RN, BSN Entered By: Baruch Gouty on 02/18/2021 09:41:41 -------------------------------------------------------------------------------- Pain Assessment Details Patient Name: Date of Service: Stephanie Shaw 02/18/2021 9:15 A M Medical Record Number: [id number] Patient Account Number: [id number] Date of Birth/Sex: Treating RN: Mar 23, 1940 (81 y.o. Elam Dutch Primary Care Mcclellan Demarais: Veleta Miners Other Clinician: Referring Rogers Ditter: Treating Percilla Tweten/Extender: Kathie Rhodes Weeks in Treatment: 2 Active Problems Location of Pain Severity and Description of Pain Patient Has Paino No Site Locations Rate the pain. Rate the pain. Current Pain Level: 0 Pain Management and Medication Current Pain Management: Electronic Signature(s) Signed: 02/18/2021 5:09:42 PM By: Baruch Gouty RN, BSN Entered By: Baruch Gouty on 02/18/2021 09:32:54 -------------------------------------------------------------------------------- Patient/Caregiver Education Details Patient Name: Date of Service: Stephanie Shaw 1/30/2023andnbsp9:15 [place] Record Number: [id number] Patient Account Number: [id number] Date of Birth/Gender: Treating RN: 04-18-1940 (81 y.o. Elam Dutch Primary Care Physician: Veleta Miners Other Clinician: Referring Physician: Treating Physician/Extender: Cleda Daub in Treatment: 2 Education Assessment Education Provided To: Patient Education Topics Provided Venous: Methods: Explain/Verbal Responses: Reinforcements needed, State content correctly Wound/Skin Impairment: Methods: Explain/Verbal Responses: Reinforcements needed, State content correctly Electronic Signature(s) Signed: 02/18/2021 5:09:42 PM By: Baruch Gouty RN, BSN Entered By: Baruch Gouty on  02/18/2021 09:42:03 -------------------------------------------------------------------------------- Wound Assessment Details Patient Name: Date of Service: Stephanie Shaw 02/18/2021 9:15 A M Medical Record Number: [id number] Patient Account Number: [id number] Date of Birth/Sex: Treating RN: 03-20-1940 (81 y.o. [name], [name] Primary Care Hassie Mandt: Veleta Miners Other Clinician: Referring Amos Micheals: Treating Garielle Mroz/Extender: Kathie Rhodes Weeks in Treatment: 2 Wound Status Wound Number: 1 Primary Lymphedema Etiology: Wound Location: Left, Anterior Lower Leg Wound Open Wounding Event: Gradually Appeared Status: Date Acquired: 08/20/2020 Comorbid Cataracts, Anemia, Arrhythmia, Hypotension, Peripheral Venous Weeks Of Treatment: 2 History: Disease, Osteoarthritis Clustered Wound: Yes Photos Wound Measurements Length: (cm) Width: (cm) Depth: (cm) Clustered Quantity: Area: (cm) Volume: (cm) 0 % Reduction in Area: 100% 0 % Reduction in Volume: 100% 0 Epithelialization: Large (67-100%) 4 Tunneling: No 0 Undermining: No 0 Wound Description Classification: Full Thickness Without Exposed Support Structures Exudate Amount: None Present Foul Odor After Cleansing: No Slough/Fibrino No Wound Bed Granulation Amount: None Present (0%) Exposed Structure Necrotic Amount: None Present (0%) Fascia Exposed: No Fat Layer (Subcutaneous Tissue) Exposed: No Tendon Exposed: No Muscle  Exposed: No Joint Exposed: No Bone Exposed: No Electronic Signature(s) Signed: 02/18/2021 5:09:42 PM By: Baruch Gouty RN, BSN Entered By: Baruch Gouty on 02/18/2021 09:40:26 -------------------------------------------------------------------------------- [place] Details Patient Name: Date of Service: Stephanie Shaw 02/18/2021 9:15 A M Medical Record Number: [id number] Patient Account Number: [id number] Date of Birth/Sex: Treating RN: [birth year] (81 y.o. [name],  [name] Primary Care Stashia Sia: Veleta Miners Other Clinician: Referring Rip Hawes: Treating Markala Sitts/Extender: Cleda Daub in Treatment: 2 Vital Signs Time Taken: 09:28 Temperature (F): 97.5 Height (in): 68 Pulse (bpm): 87 Source: Stated Respiratory Rate (breaths/min): 18 Weight (lbs): 132 Blood Pressure (mmHg): 113/72 Source: Stated Reference Range: 80 - 120 mg / dl Body Mass Index (BMI): 20.1 Electronic Signature(s) Signed: 02/18/2021 5:09:42 PM By: Baruch Gouty RN, BSN Entered By: Baruch Gouty on 02/18/2021 37:09:64

## 2021-02-18 NOTE — Progress Notes (Signed)
GEMA, RINGOLD ([id number]) Visit Report for 02/18/2021 Chief Complaint Document Details Patient Name: Date of Service: [name], [name] 02/18/2021 9:15 A M Medical Record Number: [id number] Patient Account Number: [id number] Date of Birth/Sex: Treating RN: 05-Aug-1940 (81 y.o. Sue Lush Primary Care Provider: Veleta Miners Other Clinician: Referring Provider: Treating Provider/Extender: Cleda Daub in Treatment: 2 Information Obtained from: Patient Chief Complaint Left lower extremity wound following trauma Electronic Signature(s) Signed: 02/18/2021 10:36:23 AM By: Kalman Shan DO Entered By: Kalman Shan on 02/18/2021 10:31:51 -------------------------------------------------------------------------------- HPI Details Patient Name: Date of Service: Stephanie Shaw 02/18/2021 9:15 A M Medical Record Number: [id number] Patient Account Number: [id number] Date of Birth/Sex: Treating RN: Dec 02, 1940 (81 y.o. Sue Lush Primary Care Provider: Veleta Miners Other Clinician: Referring Provider: Treating Provider/Extender: Kathie Rhodes Weeks in Treatment: 2 History of Present Illness HPI Description: Admission 01/29/2021 Ms. Stephanie Shaw is an 81 year old female with a past medical history of A. fib, chronic venous insufficiency and Parkinson's that presents to the clinic for 4- 1/28-month history of nonhealing wound to the anterior aspect of the left lower extremity. She states that on September 10, 2020 her compression stockings were being replaced by the CNA at her facility and she developed a skin tear. She states that overall the wound is stable with slight improvement in wound healing for the past several months. She has been using Xeroform to the wound bed. She wears her compression stockings every other day. She currently denies signs of infection. 1/16; patient presents for follow-up. She tolerated the compression wrap  well. She has no issues or complaints today. She denies signs of infection. She reports improvement in wound healing. 1/23; patient presents for follow-up. She reports pain to the anterior shin of her left lower extremity. She has a bruise to the lateral aspect. She states that the CNA's leg had come in contact with her leg while assisting her creating the bruise. Only the wound area was dressed with Hydrofera Blue along with Kerlix Coban. The entire leg was not in a compression wrap. She currently denies signs of infection. 1/30; patient presents for follow-up. She reports improvement in pain to her anterior shin. She has been wearing her compression stockings with Hydrofera Blue dressings. She has no issues or complaints today. Electronic Signature(s) Signed: 02/18/2021 10:36:23 AM By: Kalman Shan DO Entered By: Kalman Shan on 02/18/2021 10:32:32 -------------------------------------------------------------------------------- Physical Exam Details Patient Name: Date of Service: Stephanie Shaw 02/18/2021 9:15 A M Medical Record Number: [id number] Patient Account Number: [id number] Date of Birth/Sex: Treating RN: February 02, 1940 (81 y.o. Sue Lush Primary Care Provider: Veleta Miners Other Clinician: Referring Provider: Treating Provider/Extender: Kathie Rhodes Weeks in Treatment: 2 Constitutional respirations regular, non-labored and within target range for patient.. Cardiovascular 2+ dorsalis pedis/posterior tibialis pulses. Psychiatric pleasant and cooperative. Notes Left lower extremity: T the anterior aspect there is epithelialization to the previous wound site. No surrounding signs of infection. 2+ pitting edema to the knee. o Bruise to the lateral aspect. Electronic Signature(s) Signed: 02/18/2021 10:36:23 AM By: Kalman Shan DO Entered By: Kalman Shan on 02/18/2021  10:33:50 -------------------------------------------------------------------------------- Physician Orders Details Patient Name: Date of Service: Stephanie Shaw 02/18/2021 9:15 A M Medical Record Number: [id number] Patient Account Number: [id number] Date of Birth/Sex: Treating RN: 04/07/1940 (81 y.o. Elam Dutch Primary Care Provider: Veleta Miners Other Clinician: Referring Provider: Treating Provider/Extender: Cleda Daub in Treatment: 2 Verbal / Phone Orders: No Diagnosis  Coding Follow-up Appointments ppointment in 2 weeks. - Dr. Lou Miner Return A Bathing/ Shower/ Hygiene May shower and wash wound with soap and water. Edema Control - Lymphedema / SCD / Other Elevate legs to the level of the heart or above for 30 minutes daily and/or when sitting, a frequency of: - 3-4 times a throughout the day. Avoid standing for long periods of time. Patient to wear own compression stockings every day. - Bilateral legs- Apply in the morning and remove at night. Exercise regularly Moisturize legs daily. - right leg every night before bed. Additional Orders / Instructions Follow Nutritious Diet Non Wound Condition pply the following to affected area as directed: - vaseline to healed area on left lower leg daily and cover with foam border under the stocking. A Remove stocking and foam border at night Electronic Signature(s) Signed: 02/18/2021 10:36:23 AM By: Kalman Shan DO Entered By: Kalman Shan on 02/18/2021 10:34:03 -------------------------------------------------------------------------------- Problem List Details Patient Name: Date of Service: Stephanie Shaw 02/18/2021 9:15 A M Medical Record Number: [id number] Patient Account Number: [id number] Date of Birth/Sex: Treating RN: [birth year] (81 y.o. 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/10/2021 No Yes I87.2 Venous insufficiency (chronic) (peripheral) 01/29/2021 No Yes I89.0 Lymphedema, not elsewhere classified 01/29/2021 No Yes G20 Parkinson's disease 01/29/2021 No Yes I48.91 Unspecified atrial fibrillation 01/29/2021 No Yes Inactive Problems Resolved Problems Electronic Signature(s) Signed: 02/18/2021 10:36:23 AM By: Kalman Shan DO Entered By: Kalman Shan on 02/18/2021 10:31:35 -------------------------------------------------------------------------------- Progress Note Details Patient Name: Date of Service: Stephanie Shaw 02/18/2021 9:15 A M Medical Record Number: [id number] Patient Account Number: [id number] Date of Birth/Sex: Treating RN: January 19, 1941 (81 y.o. Sue Lush Primary Care Provider: Veleta Miners Other Clinician: Referring Provider: Treating Provider/Extender: Kathie Rhodes Weeks in Treatment: 2 Subjective Chief Complaint Information obtained from Patient Left lower extremity wound following trauma History of Present Illness (HPI) Admission 01/29/2021 Stephanie Shaw is an 81 year old female with a past medical history of A. fib, chronic venous insufficiency and Parkinson's that presents to the clinic for 4- 1/16-month history of nonhealing wound to the anterior aspect of the left lower extremity. She states that on September 10, 2020 her compression stockings were being replaced by the CNA at her facility and she developed a skin tear. She states that overall the wound is stable with slight improvement in wound healing for the past several months. She has been using Xeroform to the wound bed. She wears her compression stockings every other day. She currently denies signs of infection. 1/16; patient presents for follow-up.  She tolerated the compression wrap well. She has no issues or complaints today. She denies signs of infection. She reports improvement in wound healing. 1/23; patient presents for follow-up. She reports pain to the anterior shin of her left lower extremity. She has a bruise to the lateral aspect. She states that the CNA's leg had come in contact with her leg while assisting her creating the bruise. Only the wound area was dressed with Hydrofera Blue along with Kerlix Coban. The entire leg was not in a compression wrap. She currently denies signs of infection. 1/30; patient presents for follow-up. She reports improvement in pain to her anterior shin. She has been wearing her compression stockings with Hydrofera Blue  dressings. She has no issues or complaints today. Patient History Information obtained from Patient, Chart. Family History Cancer - Mother, Diabetes - Mother, Heart Disease - Mother, Hypertension - Mother, Kidney Disease - Father, Stroke - Siblings, No family history of Hereditary Spherocytosis, Lung Disease, Seizures, Thyroid Problems, Tuberculosis. Social History Never smoker, Marital Status - Divorced, Alcohol Use - Never, Drug Use - No History, Caffeine Use - Never. Medical History Eyes Patient has history of Cataracts - removed Denies history of Glaucoma, Optic Neuritis Ear/Nose/Mouth/Throat Denies history of Chronic sinus problems/congestion, Middle ear problems Hematologic/Lymphatic Patient has history of Anemia Denies history of Hemophilia, Human Immunodeficiency Virus, Lymphedema, Sickle Cell Disease Respiratory Denies history of Aspiration, Asthma, Chronic Obstructive Pulmonary Disease (COPD), Pneumothorax, Sleep Apnea, Tuberculosis Cardiovascular Patient has history of Arrhythmia - A. Fib, Hypotension, Peripheral Venous Disease Denies history of Angina, Congestive Heart Failure, Coronary Artery Disease, Deep Vein Thrombosis, Hypertension, Myocardial Infarction,  Peripheral Arterial Disease, Phlebitis, Vasculitis Gastrointestinal Denies history of Cirrhosis , Colitis, Crohnoos, Hepatitis A, Hepatitis B, Hepatitis C Endocrine Denies history of Type I Diabetes, Type II Diabetes Genitourinary Denies history of End Stage Renal Disease Immunological Denies history of Lupus Erythematosus, Raynaudoos, Scleroderma Integumentary (Skin) Denies history of History of Burn Musculoskeletal Patient has history of Osteoarthritis - hips and knees Denies history of Gout, Rheumatoid Arthritis, Osteomyelitis Neurologic Denies history of Dementia, Neuropathy, Quadriplegia, Paraplegia, Seizure Disorder Oncologic Denies history of Received Chemotherapy, Received Radiation Psychiatric Denies history of Anorexia/bulimia, Confinement Anxiety Hospitalization/Surgery History - Left breast biopsy. Medical A Surgical History Notes nd Genitourinary Frequent UTI Immunological Parkinson's Disease Musculoskeletal DJD Objective Constitutional respirations regular, non-labored and within target range for patient.. Vitals Time Taken: 9:28 AM, Height: 68 in, Source: Stated, Weight: 132 lbs, Source: Stated, BMI: 20.1, Temperature: 97.5 F, Pulse: 87 bpm, Respiratory Rate: 18 breaths/min, Blood Pressure: 113/72 mmHg. Cardiovascular 2+ dorsalis pedis/posterior tibialis pulses. Psychiatric pleasant and cooperative. General Notes: Left lower extremity: T the anterior aspect there is epithelialization to the previous wound site. No surrounding signs of infection. 2+ pitting o edema to the knee. Bruise to the lateral aspect. Integumentary (Hair, Skin) Wound #1 status is Open. Original cause of wound was Gradually Appeared. The date acquired was: 08/20/2020. The wound has been in treatment 2 weeks. The wound is located on the Left,Anterior Lower Leg. The wound measures 0cm length x 0cm width x 0cm depth; 0cm^2 area and 0cm^3 volume. There is no tunneling or undermining  noted. There is a none present amount of drainage noted. There is no granulation within the wound bed. There is no necrotic tissue within the wound bed. Assessment Active Problems ICD-10 Non-pressure chronic ulcer of other part of left lower leg with fat layer exposed Venous insufficiency (chronic) (peripheral) Lymphedema, not elsewhere classified Parkinson's disease Unspecified atrial fibrillation Patient has done well with Hydrofera Blue, gentamicin and her compression stocking. I recommended daily compression stockings. I also recommended using a foam border dressing for protection and she can use Vaseline at night when she takes her compression stockings off. She would like to follow-up in 2 weeks to assure that there are no issues. We will see her then. Plan Follow-up Appointments: Return Appointment in 2 weeks. - Dr. Lou Miner Bathing/ Shower/ Hygiene: May shower and wash wound with soap and water. Edema Control - Lymphedema / SCD / Other: Elevate legs to the level of the heart or above for 30 minutes daily and/or when sitting, a frequency of: - 3-4 times a throughout the day. Avoid standing for long  periods of time. Patient to wear own compression stockings every day. - Bilateral legs- Apply in the morning and remove at night. Exercise regularly Moisturize legs daily. - right leg every night before bed. Additional Orders / Instructions: Follow Nutritious Diet Non Wound Condition: Apply the following to affected area as directed: - vaseline to healed area on left lower leg daily and cover with foam border under the stocking. Remove stocking and foam border at night 1. Foam border dressing, Vaseline 2. Compression stockings daily 3. Follow-up in 2 weeks Electronic Signature(s) Signed: 02/18/2021 10:36:23 AM By: Kalman Shan DO Entered By: Kalman Shan on 02/18/2021 10:35:49 -------------------------------------------------------------------------------- HxROS  Details Patient Name: Date of Service: Stephanie Shaw 02/18/2021 9:15 A M Medical Record Number: [id number] Patient Account Number: [id number] Date of Birth/Sex: Treating RN: 07-Feb-1940 (81 y.o. Sue Lush Primary Care Provider: Veleta Miners Other Clinician: Referring Provider: Treating Provider/Extender: Cleda Daub in Treatment: 2 Information Obtained From Patient Chart Eyes Medical History: Positive for: Cataracts - removed Negative for: Glaucoma; Optic Neuritis Ear/Nose/Mouth/Throat Medical History: Negative for: Chronic sinus problems/congestion; Middle ear problems Hematologic/Lymphatic Medical History: Positive for: Anemia Negative for: Hemophilia; Human Immunodeficiency Virus; Lymphedema; Sickle Cell Disease Respiratory Medical History: Negative for: Aspiration; Asthma; Chronic Obstructive Pulmonary Disease (COPD); Pneumothorax; Sleep Apnea; Tuberculosis Cardiovascular Medical History: Positive for: Arrhythmia - A. Fib; Hypotension; Peripheral Venous Disease Negative for: Angina; Congestive Heart Failure; Coronary Artery Disease; Deep Vein Thrombosis; Hypertension; Myocardial Infarction; Peripheral Arterial Disease; Phlebitis; Vasculitis Gastrointestinal Medical History: Negative for: Cirrhosis ; Colitis; Crohns; Hepatitis A; Hepatitis B; Hepatitis C Endocrine Medical History: Negative for: Type I Diabetes; Type II Diabetes Genitourinary Medical History: Negative for: End Stage Renal Disease Past Medical History Notes: Frequent UTI Immunological Medical History: Negative for: Lupus Erythematosus; Raynauds; Scleroderma Past Medical History Notes: Parkinson's Disease Integumentary (Skin) Medical History: Negative for: History of Burn Musculoskeletal Medical History: Positive for: Osteoarthritis - hips and knees Negative for: Gout; Rheumatoid Arthritis; Osteomyelitis Past Medical History Notes: DJD Neurologic Medical  History: Negative for: Dementia; Neuropathy; Quadriplegia; Paraplegia; Seizure Disorder Oncologic Medical History: Negative for: Received Chemotherapy; Received Radiation Psychiatric Medical History: Negative for: Anorexia/bulimia; Confinement Anxiety HBO Extended History Items Eyes: Cataracts Immunizations Pneumococcal Vaccine: Received Pneumococcal Vaccination: Yes Received Pneumococcal Vaccination On or After 60th Birthday: Yes Implantable Devices No devices added Hospitalization / Surgery History Type of Hospitalization/Surgery Left breast biopsy Family and Social History Cancer: Yes - Mother; Diabetes: Yes - Mother; Heart Disease: Yes - Mother; Hereditary Spherocytosis: No; Hypertension: Yes - Mother; Kidney Disease: Yes - Father; Lung Disease: No; Seizures: No; Stroke: Yes - Siblings; Thyroid Problems: No; Tuberculosis: No; Never smoker; Marital Status - Divorced; Alcohol Use: Never; Drug Use: No History; Caffeine Use: Never; Financial Concerns: No; Food, Clothing or Shelter Needs: No; Support System Lacking: No; Transportation Concerns: No Electronic Signature(s) Signed: 02/18/2021 10:36:23 AM By: Kalman Shan DO Signed: 02/18/2021 4:11:37 PM By: Lorrin Jackson Entered By: Kalman Shan on 02/18/2021 10:32:39 -------------------------------------------------------------------------------- SuperBill Details Patient Name: Date of Service: Stephanie Shaw 02/18/2021 Medical Record Number: [id number] Patient Account Number: [id number] Date of Birth/Sex: Treating RN: 08/01/1940 (81 y.o. Elam Dutch Primary Care Provider: Veleta Miners Other Clinician: Referring Provider: Treating Provider/Extender: Kathie Rhodes Weeks in Treatment: 2 Diagnosis Coding ICD-10 Codes Code Description [phone] Non-pressure chronic ulcer of other part of left lower leg with fat layer exposed I87.2 Venous insufficiency (chronic) (peripheral) I89.0 Lymphedema,  not elsewhere classified G20 Parkinson's disease I48.91 Unspecified atrial fibrillation Facility Procedures  CPT4 Code: [id number] Description: [place] VISIT-LEV 3 EST PT Modifier: Quantity: 1 Physician Procedures : CPT4 Code Description Modifier [id number] - WC PHYS LEVEL 3 - EST PT 1 ICD-10 Diagnosis Description L97.822 Non-pressure chronic ulcer of other part of left lower leg with fat layer exposed I87.2 Venous insufficiency (chronic) (peripheral) I89.0  Lymphedema, not elsewhere classified G20 Parkinson's disease Quantity: Electronic Signature(s) Signed: 02/18/2021 10:36:23 AM By: Kalman Shan DO Entered By: Kalman Shan on 02/18/2021 10:36:02

## 2021-02-23 ENCOUNTER — Encounter: Payer: Self-pay | Admitting: Internal Medicine

## 2021-02-26 DIAGNOSIS — N139 Obstructive and reflux uropathy, unspecified: Secondary | ICD-10-CM | POA: Diagnosis not present

## 2021-02-26 DIAGNOSIS — N3020 Other chronic cystitis without hematuria: Secondary | ICD-10-CM | POA: Diagnosis not present

## 2021-02-26 DIAGNOSIS — N3946 Mixed incontinence: Secondary | ICD-10-CM | POA: Diagnosis not present

## 2021-03-04 ENCOUNTER — Encounter (HOSPITAL_BASED_OUTPATIENT_CLINIC_OR_DEPARTMENT_OTHER): Payer: Medicare PPO | Admitting: Internal Medicine

## 2021-03-07 ENCOUNTER — Non-Acute Institutional Stay (SKILLED_NURSING_FACILITY): Payer: Medicare PPO | Admitting: Nurse Practitioner

## 2021-03-07 ENCOUNTER — Encounter: Payer: Self-pay | Admitting: Nurse Practitioner

## 2021-03-07 DIAGNOSIS — M159 Polyosteoarthritis, unspecified: Secondary | ICD-10-CM | POA: Diagnosis not present

## 2021-03-07 DIAGNOSIS — N3000 Acute cystitis without hematuria: Secondary | ICD-10-CM | POA: Diagnosis not present

## 2021-03-07 DIAGNOSIS — G20 Parkinson's disease: Secondary | ICD-10-CM

## 2021-03-07 DIAGNOSIS — D500 Iron deficiency anemia secondary to blood loss (chronic): Secondary | ICD-10-CM

## 2021-03-07 DIAGNOSIS — G903 Multi-system degeneration of the autonomic nervous system: Secondary | ICD-10-CM

## 2021-03-07 DIAGNOSIS — K219 Gastro-esophageal reflux disease without esophagitis: Secondary | ICD-10-CM

## 2021-03-07 DIAGNOSIS — I480 Paroxysmal atrial fibrillation: Secondary | ICD-10-CM | POA: Diagnosis not present

## 2021-03-07 DIAGNOSIS — I872 Venous insufficiency (chronic) (peripheral): Secondary | ICD-10-CM

## 2021-03-07 DIAGNOSIS — G20C Parkinsonism, unspecified: Secondary | ICD-10-CM

## 2021-03-07 DIAGNOSIS — I739 Peripheral vascular disease, unspecified: Secondary | ICD-10-CM | POA: Diagnosis not present

## 2021-03-07 DIAGNOSIS — N3941 Urge incontinence: Secondary | ICD-10-CM

## 2021-03-07 NOTE — Assessment & Plan Note (Signed)
Chronic, minimal,  off Metolazone 09/27/19. Echo EF normal in the past.

## 2021-03-07 NOTE — Progress Notes (Addendum)
Location:  S[facility] [place] Room Number: 13 Place of Service:  SNF (31) Provider: [facility] Deontrey Massi NP  [name], MD  Patient Care Team: [name], MD as PCP - General (Internal Medicine) [name], [name], MD as PCP - Cardiology (Cardiology) [name], [name], DO as Consulting Physician (Neurology) Geovana Gebel X, NP as Nurse Practitioner (Internal Medicine)  Extended Emergency Contact Information Primary Emergency Contact: Sivan, Cuello Mobile Phone: [phone] Relation: Daughter Interpreter needed? No Secondary Emergency Contact: [facility] Address: [address]          [place], [place] [address] [name] of [place] Phone: [phone] Mobile Phone: [phone] Relation: Son  Code Status:  DNR Goals of care: Advanced Directive information Advanced Directives 02/12/2021  Does Patient Have a Medical Advance Directive? Yes  Type of Paramedic of [place];Living will;Out of facility DNR (pink MOST or yellow form)  Does patient want to make changes to medical advance directive? No - Patient declined  Copy of [place] in Chart? Yes - validated most recent copy scanned in chart (See row information)  Would patient like information on creating a medical advance directive? -  Pre-existing out of facility DNR order (yellow form or pink MOST form) -     Chief Complaint  Patient presents with   Medical Management of Chronic Issues    HPI:  Pt is a 81 y.o. female seen today for medical management of chronic diseases.    Recurrent UTI, saw Urology, placed on Estrace x2/wk, TMP 112m daily. C/o suprapubic pressure/mild pain upon awake in am, resolves after urination. The patient stated she doesn't urinate through night. Denied dysuria, urinary frequency, or urgency.                PAD 12/25/20 ABI R+L 0.7, moderate arterial disease, vascular specialist if desires.              GERD, takes Protonix             The  patient has chronic edema BLE. Off Metolazone 09/27/19. Echo EF normal in the past.              Afib, heart rate is in control, on Xarelto, off Diltiazem. Hgb 11.8 11/22/20, TSH 1.30 03/29/20             OA,  on Tylenol, Methocarbamol 2558mqd.       Parkinson's, symptoms improved, on Sinemet, Comtan[facility]              Orthostatic hypotension, improved, takes Midodrine.              Anemia, stable, Hgb 11.8 11/22/20,  takes Fe             Urge incontinent of urine, takes Mirabegron.              Constipation, takes MiraLax Past Medical History:  Diagnosis Date   Acute lower UTI 09/14/2018   Anemia 10/15/2018   2016 colonoscopy 10/14/18 wbc 5.4, Hgb 10.1, plt 184 12/01/18 wbc 4.5, Hgb 11.1, plt 188, neutrophils 63,  Na 139, K 3.7, Bun 23, creat 0.69, eGFR 83 on Fe, Hgb 11.7 12/09/18    Atrial fibrillation ([place]/01/2010   10/07/18 Na 143, K 4.0, Bun 20, creat 0.79, eGFR 72, wbc 7.1, Hgb 10.9, plt 172 10/28/18 Na 142, K 4.0, Bun 19, creat 0.77, eGFR 74 11/18/18 Na 144, K  4.1, Bun 21, creat 0.69, eGFR 83   Bilateral lower extremity edema 07/19/2018   11/02/18 BMP 2 weeks.    Cervical pain (neck) 06/20/2010   Closed fracture of part of upper end of humerus 05/01/2015   Colles' fracture of right radius 03/05/2015   Constipation 07/19/2018   DEGENERATIVE JOINT DISEASE, RIGHT HIP 02/16/2007   Dupuytren's contracture    Dysuria 09/20/2018   09/20/18 c/o got up several times last night to go urinate, burning on urination, lower abd /back discomfort, but urinary frequency, leakage are not new. UA C/S, Pyridium 129m tid x 2 days.  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/2017   S/P breast biopsy, left    two o'clock position - benign   Toxic effect of venom(989.5) 07/27/2007   Tremor, unspecified 10/19/2015   Unstable gait 02/16/2017   Vaginal atrophy 10/19/2015   Venous insufficiency    Weakness 09/12/2018   Past Surgical History:  Procedure Laterality Date   BREAST BIOPSY Left    CATARACT EXTRACTION, BILATERAL      Allergies  Allergen Reactions   Doxycycline Nausea And Vomiting   Sulfonamide Derivatives     REACTION: HIVES    Allergies as of 03/07/2021       Reactions   Doxycycline Nausea And Vomiting   Sulfonamide Derivatives    REACTION: HIVES        Medication List        Accurate as of March 07, 2021 11:59 PM. If you have any questions, ask your nurse or doctor.          acetaminophen 500 MG tablet Commonly known as: TYLENOL Take 1,000 mg by mouth 3 (three) times daily as needed.   acetaminophen 500 MG tablet Commonly known as: TYLENOL Take 1,000 mg by mouth at bedtime. administer at 9:30pm per resident request DO NOT EXCEED 3009mIN A 24 HOUR PERIOD   ARTIFICIAL TEARS OP Place 1 drop into both eyes 3 (three) times daily.   calcium carbonate 750 MG chewable tablet Commonly known as: TUMS EX Chew 1 tablet by mouth daily. Between the hours of 7am-10am.   carbidopa-levodopa 25-100 MG tablet Commonly known as: SINEMET IR Take 2 tablets by mouth 5 (five) times daily.   carbidopa-levodopa 50-200 MG tablet Commonly known as: SINEMET CR Take 1 tablet by mouth at bedtime.   carbidopa-levodopa 25-100 MG tablet Commonly known as: SINEMET IR Take 1 tablet by mouth once as needed. May give one additional tablet daily in addition to scheduled dose as needed for increased symptoms related to Parkinson's. For taking at 11am if needed. Do  not take after 1130 or within one hour of next dose.   Cranberry 450 MG Tabs Take 1 tablet by mouth daily.   diclofenac Sodium 1 % Gel Commonly known as: VOLTAREN Apply topically 3 (three) times daily as needed.   entacapone 200 MG tablet Commonly known as: COMTAN Take 200 mg by mouth 3 (three) times daily.   ferrous sulfate 325 (65 FE) MG  tablet Take 325 mg by mouth every Monday, Wednesday, and Friday. Give with food   gentamicin cream 0.1 % Commonly known as: GARAMYCIN Apply 1 application topically every other day.   hydrocortisone 25 MG suppository Commonly known as: ANUSOL-HC Place 25 mg rectally daily as needed for hemorrhoids or anal itching.   methocarbamol 500 MG tablet Commonly known as: ROBAXIN Take 250 mg by mouth daily. As needed   midodrine 5 MG tablet Commonly known as: PROAMATINE Take 5 mg by mouth 3 (three) times daily with meals.   mirabegron ER 50 MG Tb24 tablet Commonly known as: MYRBETRIQ Take 50 mg by mouth daily.   NUTRITIONAL SUPPLEMENT PO Take by mouth daily in the afternoon. BOOST   pantoprazole 40 MG tablet Commonly known as: PROTONIX Take 40 mg by mouth daily.   polyethylene glycol 17 g packet Commonly known as: MIRALAX / GLYCOLAX Take 17 g by mouth daily.   PreviDent 5000 Booster Plus 1.1 % Pste Generic drug: Sodium Fluoride Place 1 application onto teeth in the morning and at bedtime.   rivaroxaban 20 MG Tabs tablet Commonly known as: XARELTO Take 20 mg by mouth every evening. Between the hours of 5pm-6pm.   Saline 3 % Aers Place 1 spray into the nose 2 (two) times daily.   simethicone 80 MG chewable tablet Commonly known as: MYLICON Chew 80 mg by mouth 3 (three) times daily.   Vitamin D3 10 MCG (400 UNIT) Caps Take 1 capsule by mouth daily. Between the hours of 7am-10am.   zinc oxide 20 % ointment Apply 1 application topically as needed for irritation. To buttocks after every incontinent episode and as needed for redness.  May keep at bedside.        Review of Systems  Constitutional:  Negative for appetite change, fatigue and fever.  HENT:  Positive for hearing loss. Negative for congestion, nosebleeds and trouble swallowing.   Eyes:  Positive for visual disturbance.       C/o double vision  Respiratory:  Negative for shortness of breath.   Cardiovascular:  Positive for leg swelling.  Gastrointestinal:  Negative for abdominal pain and constipation.       Suprapubic region discomfort/pressure upon first awake in am, resolves after urination.   Genitourinary:  Negative for dysuria, frequency, hematuria and urgency.  Musculoskeletal:  Positive for arthralgias, back pain and gait problem. Negative for myalgias.       Lower back discomfort positional. Chronic shoulder pain L>R, full ROM  Skin:  Negative for color change.  Neurological:  Positive for tremors. Negative for speech difficulty and headaches.       Moves slow, fine tremor in fingers, burning sensation  in the R+ L great toes when touched by sheets, comes/goes  Psychiatric/Behavioral:  Negative for behavioral problems and sleep disturbance. The patient is nervous/anxious.        Sleeps from 9pm to 4-5am usually, feels anxious sometimes.    Immunization History  Administered Date(s) Administered   Influenza Split 10/20/2012   Influenza Whole 10/21/2006   Influenza, High Dose Seasonal PF 10/17/2016, 11/02/2019   Influenza-Unspecified 10/20/2013, 10/22/2017, 11/07/2020   Moderna Sars-Covid-2 Vaccination 01/22/2019, 02/19/2019, 11/29/2019, 06/19/2020   Pfizer Covid-19 Vaccine Bivalent Booster 54yr & up 10/10/2020   Pneumococcal Conjugate-13 01/31/2013   Pneumococcal Polysaccharide-23 01/20/2005, 11/24/2011   Td 01/21/2004   Tdap 03/16/2014   Zoster, Live 04/17/2009   Pertinent  Health Maintenance Due  Topic Date Due   INFLUENZA VACCINE  Completed   DEXA SCAN  Completed   COLONOSCOPY (Pts 45-7yr Insurance coverage will need  to be confirmed)  Discontinued   Fall Risk 09/11/2018 09/12/2018 09/12/2018 09/13/2018 09/14/2018  Falls in the past year? - - - - -  Was there an injury with Fall? - - - - -  Fall Risk Category Calculator - - - - -  Fall Risk Category - - - - -  Patient Fall Risk Level High fall risk High fall risk High fall risk High fall risk High fall risk  Patient at Risk for Falls Due to - - - - -  Fall risk Follow up - - - - -   Functional Status Survey:    Vitals:   03/07/21 1034  BP: 134/70  Pulse: 76  Resp: 18  Temp: 98.6 F (37 C)   There is no height or weight on file to calculate BMI. Physical Exam Vitals and nursing note reviewed.  Constitutional:      Appearance: Normal appearance.  HENT:     Head: Normocephalic and atraumatic.     Mouth/Throat:     Mouth: Mucous membranes are moist.  Eyes:     Extraocular Movements: Extraocular movements intact.     Conjunctiva/sclera: Conjunctivae normal.     Pupils: Pupils are equal, round, and reactive to light.     Comments: Chronic double vision, see Ophthalmology 09/20/20  Cardiovascular:     Rate and Rhythm: Normal rate. Rhythm irregular.     Heart sounds: No murmur heard.    Comments: DP pulses present L>R Pulmonary:     Effort: Pulmonary effort is normal.     Breath sounds: No rales.  Abdominal:     General: Bowel sounds are normal.     Palpations: Abdomen is soft.     Tenderness: There is no abdominal tenderness. There is no right CVA tenderness, left CVA tenderness, guarding or rebound.  Musculoskeletal:        General: Tenderness present.     Cervical back: Normal range of motion and neck supple.     Right lower leg: Edema present.     Left lower leg: Edema present.     Comments: trace edema LLE, 1+ RLE. Chronic lower back, shoulder R+L pain.   Skin:    General: Skin is warm and dry.     Comments: ABI showed moderate PAD  Neurological:     General: No focal deficit present.     Mental Status: She is alert and oriented to  person, place, and time. Mental status is at baseline.     Coordination: Coordination abnormal.     Gait: Gait abnormal.     Comments: Moves slow, tremor in hands. No facial or limb weakness.   Psychiatric:        Mood and Affect: Mood normal.        Behavior: Behavior normal.    Labs reviewed: Recent Labs    03/29/20 0000 05/23/20 0000 10/12/20 0000  NA 141 139 141  K 4.5 4.3 4.2  CL 107 105 109*  CO2 23* 27* 27*  BUN 17 23* 20  CREATININE 0.8 0.7 0.7  CALCIUM 9.4 9.7 9.0   Recent Labs    03/29/20 0000 05/23/20 0000 10/12/20 0000  AST 10* 10* 11*  ALT 7 3* 5*  ALKPHOS 61 60 53  ALBUMIN 3.8 4.2 3.6   Recent Labs    05/23/20 0000 10/12/20 0000 11/22/20 0000  WBC 7.4 4.1 6.0  NEUTROABS 6,083.00 2,817.00 4,620.00  HGB 12.4  11.0* 11.8*  HCT 38 35* 36  PLT 174 160 181   Lab Results  Component Value Date   TSH 1.30 03/29/2020   No results found for: HGBA1C Lab Results  Component Value Date   CHOL 185 03/24/2019   HDL 79 (A) 03/24/2019   LDLCALC 94 03/24/2019   LDLDIRECT 104.2 01/25/2013   TRIG 41 03/24/2019   CHOLHDL 2 08/29/2015    Significant Diagnostic Results in last 30 days:  No results found.  Assessment/Plan  UTI (urinary tract infection) Recurrent UTI, saw Urology, placed on Estrace x2/wk, TMP 158m daily for suppression therapy. C/o suprapubic pressure/mild pain upon awake in am, resolves after urination, not new. The patient stated she doesn't urinate through night. Denied dysuria, urinary frequency, or urgency.  Will assist the patient to urinate 3am to avoid ? mild blladder overdistention, UKoreabladder, observe.   PAD (peripheral artery disease) ([place]  12/25/20 ABI R+L 0.7, moderate arterial disease, vascular specialist if desires. no open wounds.   GERD (gastroesophageal reflux disease) Stable,  takes Protonix  Edema of left lower extremity due to peripheral venous insufficiency Chronic, minimal,  off Metolazone 09/27/19. Echo EF normal in  the past.   Atrial fibrillation (HCC) heart rate is in control, on Xarelto, off Diltiazem. 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  Parkinsonism (HCCalvertonsymptoms improved, on Sinemet, Comtan, [facility], [facility]   Hypotension  improved, takes Midodrine.   Anemia  stable, Hgb 11.8 11/22/20,  takes Fe  Urge incontinence of urine takes Mirabegron. No urination at night, c/o suprapubic pressure/mild pain for a while, able to urinate on her own, symptoms resolve after urination. ? Mild bladder overdistention, will try to assist pt to urinate 3am to avoid possible bladder overdistention, USKorealadder, observe.  03/11/21 resolved upon awaken in am  suprapubic pressure/mild pain since urination 3am started.    Family/ staff Communication: plan of care reviewed with the patient and charge nurse.   Labs/tests ordered:  USKorealadder  Time spend 35 minutes.

## 2021-03-07 NOTE — Assessment & Plan Note (Signed)
heart rate is in control, on Xarelto, off Diltiazem. Hgb 11.8 11/22/20, TSH 1.30 03/29/20 

## 2021-03-07 NOTE — Assessment & Plan Note (Signed)
Recurrent UTI, saw Urology, placed on Estrace x2/wk, TMP 100mg  daily for suppression therapy. C/o suprapubic pressure/mild pain upon awake in am, resolves after urination, not new. The patient stated she doesn't urinate through night. Denied dysuria, urinary frequency, or urgency.  Will assist the patient to urinate 3am to avoid ? mild blladder overdistention, US bladder, observe.

## 2021-03-07 NOTE — Assessment & Plan Note (Signed)
on Tylenol, Methocarbamol 250mg  qd.

## 2021-03-07 NOTE — Assessment & Plan Note (Signed)
improved, takes Midodrine.

## 2021-03-07 NOTE — Assessment & Plan Note (Addendum)
12/25/20 ABI R+L 0.7, moderate arterial disease, vascular specialist if desires. no open wounds.

## 2021-03-07 NOTE — Assessment & Plan Note (Addendum)
takes Mirabegron. No urination at night, c/o suprapubic pressure/mild pain for a while, able to urinate on her own, symptoms resolve after urination. ? Mild bladder overdistention, will try to assist pt to urinate 3am to avoid possible bladder overdistention, US bladder, observe.  03/11/21 resolved upon awaken in am  suprapubic pressure/mild pain since urination 3am started.

## 2021-03-07 NOTE — Assessment & Plan Note (Signed)
Stable,  takes Protonix

## 2021-03-07 NOTE — Assessment & Plan Note (Signed)
stable, Hgb 11.8 11/22/20,  takes Fe 

## 2021-03-07 NOTE — Assessment & Plan Note (Signed)
symptoms improved, on Sinemet, Comtan, f/u neurology at [facility]. double vision, chronic, saw Ophthalmology

## 2021-03-15 DIAGNOSIS — N281 Cyst of kidney, acquired: Secondary | ICD-10-CM | POA: Diagnosis not present

## 2021-03-15 DIAGNOSIS — R1033 Periumbilical pain: Secondary | ICD-10-CM | POA: Diagnosis not present

## 2021-03-15 DIAGNOSIS — R9341 Abnormal radiologic findings on diagnostic imaging of renal pelvis, ureter, or bladder: Secondary | ICD-10-CM | POA: Diagnosis not present

## 2021-03-15 DIAGNOSIS — R34 Anuria and oliguria: Secondary | ICD-10-CM | POA: Diagnosis not present

## 2021-03-25 ENCOUNTER — Encounter: Payer: Self-pay | Admitting: Internal Medicine

## 2021-04-01 ENCOUNTER — Encounter: Payer: Self-pay | Admitting: Cardiovascular Disease

## 2021-04-04 ENCOUNTER — Telehealth: Payer: Self-pay | Admitting: *Deleted

## 2021-04-04 ENCOUNTER — Telehealth: Payer: Self-pay | Admitting: Cardiovascular Disease

## 2021-04-04 ENCOUNTER — Encounter: Payer: Self-pay | Admitting: *Deleted

## 2021-04-04 NOTE — Telephone Encounter (Signed)
Pre-op covering staff, I don't see a pre-op clearance form for her. Can you follow-up on this? It looks like we haven't seen her since 07/2019 so if they need clearance from [place], she is technically going to need a visit since it has been over 1.5 years since we have seen her. ? ?Thanks so much! ?Harper Smoker ?

## 2021-04-04 NOTE — Telephone Encounter (Signed)
S/w pt's daughter Baldo Ash and have scheduled a tele appt ok per Sande Rives, PAC, see notes from [facility]. ? ?Pt's daughter lives in [place] and pt resides in [place] [place] and would require transportation. Xarelto is followed by PCP. Per Dr. Acie Fredrickson note when he saw the pt in 2021 to f/u PRN. Tele appt has been made for tomorrow at 4 pm. Daughter will call the nursing facility and have someone there at appt time to help her mom who has parkinson's. Baldo Ash thanked me again for all of our help.  ?

## 2021-04-04 NOTE — Telephone Encounter (Signed)
? ?  Pre-operative Risk Assessment  ?  ?Patient Name: Stephanie Shaw  ?DOB: 11/27/1940 ?MRN: [id number]  ? ? ? ?Request for Surgical Clearance   ? ?Procedure:  Dental Extraction - Amount of Teeth to be Pulled:  1 tooth ? ?Date of Surgery:  04-15-21 ? ?Clearance                               ?   ?Surgeon:  Dr Verdene Lennert ?Surgeon's Group or Practice Name:   ? ?Phone number:  [phone] ?Fax number:  [phone] ?  ?Type of Clearance Requested:  Medicine-  does she need to stop her Xarelto? ? ?  ?Type of Anesthesia:   just a numbing medicine ?  ?Additional requests/questions:   ? ?Signed, ?Glyn Ade   ?04/04/2021, 2:35 PM  ? ?

## 2021-04-04 NOTE — Progress Notes (Signed)
This encounter was created in error - please disregard.

## 2021-04-04 NOTE — Telephone Encounter (Signed)
?  Patient Consent for Virtual Visit  ? ? ?   ? ?Stephanie Shaw has provided verbal consent on 04/04/2021 for a virtual visit (video or telephone). ? ? ?CONSENT FOR VIRTUAL VISIT FOR:  Stephanie Shaw  ?By participating in this virtual visit I agree to the following: ? ?I hereby voluntarily request, consent and authorize [facility] and its employed or contracted physicians, physician assistants, nurse practitioners or other licensed health care professionals (the Practitioner), to provide me with telemedicine health care services (the ?Services") as deemed necessary by the treating Practitioner. I acknowledge and consent to receive the Services by the Practitioner via telemedicine. I understand that the telemedicine visit will involve communicating with the Practitioner through live audiovisual communication technology and the disclosure of certain medical information by electronic transmission. I acknowledge that I have been given the opportunity to request an in-person assessment or other available alternative prior to the telemedicine visit and am voluntarily participating in the telemedicine visit. ? ?I understand that I have the right to withhold or withdraw my consent to the use of telemedicine in the course of my care at any time, without affecting my right to future care or treatment, and that the Practitioner or I may terminate the telemedicine visit at any time. I understand that I have the right to inspect all information obtained and/or recorded in the course of the telemedicine visit and may receive copies of available information for a reasonable fee.  I understand that some of the potential risks of receiving the Services via telemedicine include:  ?Delay or interruption in medical evaluation due to technological equipment failure or disruption; ?Information transmitted may not be sufficient (e.g. poor resolution of images) to allow for appropriate medical decision making by the Practitioner;  and/or  ?In rare instances, security protocols could fail, causing a breach of personal health information. ? ?Furthermore, I acknowledge that it is my responsibility to provide information about my medical history, conditions and care that is complete and accurate to the best of my ability. I acknowledge that Practitioner's advice, recommendations, and/or decision may be based on factors not within their control, such as incomplete or inaccurate data provided by me or distortions of diagnostic images or specimens that may result from electronic transmissions. I understand that the practice of medicine is not an exact science and that Practitioner makes no warranties or guarantees regarding treatment outcomes. I acknowledge that a copy of this consent can be made available to me via my patient portal ([facility] [place]), or I can request a printed copy by calling the office of [facility].   ? ?I understand that my insurance will be billed for this visit.  ? ?I have read or had this consent read to me. ?I understand the contents of this consent, which adequately explains the benefits and risks of the Services being provided via telemedicine.  ?I have been provided ample opportunity to ask questions regarding this consent and the Services and have had my questions answered to my satisfaction. ?I give my informed consent for the services to be provided through the use of telemedicine in my medical care ? ? ? ?

## 2021-04-04 NOTE — Telephone Encounter (Signed)
Update to note below: Patient has not been seen by our office since 07/2019; however, at that time Dr. Acie Fredrickson said patient could follow-up with [place] PRN given she had stable and chronic atrial fibrillation and it is difficult for her to get to appointments. Her PCP has been managing her Xarelto but Dr. Acie Fredrickson did state she may hold her Xarelto for 2-3 days prior to any invasive procedures if needed. ? ?Patient's daughter sent [place] a MyChart message informing us of upcoming dental procedure due to concerns for excessive bleeding on Xarelto. She is scheduled to have 1 tooth extraction on 04/15/2021. Dental extractions are very low risk and generally do not require any medical clearance. In addition, Xarelto should not have to be held for 1 tooth extraction.   ? ?We have arranged a telehealth visit with the pre-op APP tomorrow simply because we have not seen her in over 1.5 years.  ? ?Darreld Mclean, PA-C ?04/04/2021 4:59 PM ? ? ?

## 2021-04-04 NOTE — Telephone Encounter (Signed)
? ?  Name: Stephanie Shaw  ?DOB: [birth year]  ?MRN: [id number] ? ?Primary Cardiologist: Mertie Moores, MD ? ?Chart reviewed as part of pre-operative protocol coverage. Dental procedures are low risks and per guidelines generally do not require any specific clearance. However, she has not been seen in our office since 07/2019. Therefore, she will require a follow-up visit. ? ?Pre-op covering staff: ?- Please schedule appointment and call patient to inform them. If patient already had an upcoming appointment within acceptable timeframe, please add "pre-op clearance" to the appointment notes so provider is aware. ?- Please contact requesting surgeon's office via preferred method (i.e, phone, fax) to inform them of need for appointment prior to surgery. ? ?Xarelto should not need to be held for only 1 tooth extraction. ? ?Darreld Mclean, PA-C  ?04/04/2021, 3:26 PM  ? ?

## 2021-04-05 ENCOUNTER — Ambulatory Visit (INDEPENDENT_AMBULATORY_CARE_PROVIDER_SITE_OTHER): Payer: Medicare PPO | Admitting: General Practice

## 2021-04-05 ENCOUNTER — Other Ambulatory Visit: Payer: Self-pay

## 2021-04-05 DIAGNOSIS — Z01810 Encounter for preprocedural cardiovascular examination: Secondary | ICD-10-CM | POA: Diagnosis not present

## 2021-04-05 NOTE — Progress Notes (Signed)
? ?Virtual Visit via Telephone Note  ? ?This visit type was conducted due to national recommendations for restrictions regarding the COVID-19 Pandemic (e.g. social distancing) in an effort to limit this patient's exposure and mitigate transmission in our community.  Due to her co-morbid illnesses, this patient is at least at moderate risk for complications without adequate follow up.  This format is felt to be most appropriate for this patient at this time.  The patient did not have access to video technology/had technical difficulties with video requiring transitioning to audio format only (telephone).  All issues noted in this document were discussed and addressed.  No physical exam could be performed with this format.  Please refer to the patient's chart for her  consent to telehealth for [facility]. ?Evaluation Performed:  Preoperative cardiovascular risk assessment ? ?This visit type was conducted due to national recommendations for restrictions regarding the COVID-19 Pandemic (e.g. social distancing).  This format is felt to be most appropriate for this patient at this time.  All issues noted in this document were discussed and addressed.  No physical exam was performed (except for noted visual exam findings with Video Visits).  Please refer to the patient's chart (MyChart message for video visits and phone note for telephone visits) for the patient's consent to telehealth for [facility]. ?_____________  ? ?Date:  04/05/2021  ? ?Patient ID:  Stephanie Shaw, DOB March 17, 1940, MRN [id number] ?Patient Location:  ?Home ?Provider location:   ?Office ? ?Primary Care Provider:  [name], MD ?Primary Cardiologist:  Mertie Moores, MD ? ?Chief Complaint  ?  ?81 y.o. y/o female with a h/o atrial fibrillation, peripheral arterial disease, GERD, dysphagia, Parkinson's, who is pending tooth extraction, and presents today for telephonic preoperative cardiovascular risk assessment. ? ?Past Medical History  ?   ?Past Medical History:  ?Diagnosis Date  ? Acute lower UTI 09/14/2018  ? Anemia 10/15/2018  ? 2016 colonoscopy 10/14/18 wbc 5.4, Hgb 10.1, plt 184 12/01/18 wbc 4.5, Hgb 11.1, plt 188, neutrophils 63,  Na 139, K 3.7, Bun 23, creat 0.69, eGFR 83 on Fe, Hgb 11.7 12/09/18   ? Atrial fibrillation ([place]) 05/21/2010  ? 10/07/18 Na 143, K 4.0, Bun 20, creat 0.79, eGFR 72, wbc 7.1, Hgb 10.9, plt 172 10/28/18 Na 142, K 4.0, Bun 19, creat 0.77, eGFR 74 11/18/18 Na 144, K 4.1, Bun 21, creat 0.69, eGFR 83  ? Bilateral lower extremity edema 07/19/2018  ? 11/02/18 BMP 2 weeks.   ? Cervical pain (neck) 06/20/2010  ? Closed fracture of part of upper end of humerus 05/01/2015  ? Colles' fracture of right radius 03/05/2015  ? Constipation 07/19/2018  ? DEGENERATIVE JOINT DISEASE, RIGHT HIP 02/16/2007  ? Dupuytren's contracture   ? Dysuria 09/20/2018  ? 09/20/18 c/o got up several times last night to go urinate, burning on urination, lower abd /back discomfort, but urinary frequency, leakage are not new. UA C/S, Pyridium 120m tid x 2 days.  09/21/18 wbc 6.1, Hgb 11.8, plt 210, neutrophil 69.4, Na 142, K 4.1, Bun 19, creat 0.78, TP 6.4, albumin 3.9  ? Hematuria 02/04/2014  ? Hypotension 09/18/2018  ? Long term (current) use of anticoagulants 05/29/2016  ? Osteoarthritis of hip   ? right  ? Osteoarthritis of left knee   ? Overactive bladder 10/06/2018  ? Parkinson disease ([place]   ? Parox[place]mal A-fib ([place]   ? POSTMENOPAUSAL SYNDROME 02/16/2007  ? PREMATURE ATRIAL CONTRACTIONS 02/16/2007  ? 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  S/P breast biopsy, left   ? two o'clock position - benign  ? Toxic effect of venom(989.5) 07/27/2007  ? Tremor, unspecified 10/19/2015  ? Unstable gait 02/16/2017  ? Vaginal atrophy 10/19/2015  ? Venous insufficiency   ? Weakness 09/12/2018  ? ?Past Surgical History:  ?Procedure Laterality Date  ? BREAST BIOPSY Left   ? CATARACT EXTRACTION, BILATERAL    ? ? ?Allergies ? ?Allergies  ?Allergen Reactions  ? Doxycycline Nausea And  Vomiting  ? Sulfonamide Derivatives   ?  REACTION: HIVES  ? ? ?History of Present Illness  ?  ?DETTA Shaw is a 81 y.o. female who presents via audio/video conferencing for a telehealth visit today.  Pt was last seen in cardiology clinic on 08/09/2019, by Dr. Acie Fredrickson.  At that time SHERROL VICARS was doing well .  she is now pending dental extraction-1 tooth.  Since his last visit, she she remains stable from a cardiac standpoint. ? ?Today she denies chest pain, increased shortness of breath, increased lower extremity edema,  palpitations, melena, hematuria, hemoptysis, diaphoresis, increased weakness,  orthopnea, and PND. ? ? ? ?Home Medications  ?  ?Prior to Admission medications   ?Medication Sig Start Date End Date Taking? Authorizing Provider  ?acetaminophen (TYLENOL) 500 MG tablet Take 1,000 mg by mouth 3 (three) times daily as needed.     [provider]  ?acetaminophen (TYLENOL) 500 MG tablet Take 1,000 mg by mouth at bedtime. administer at 9:30pm per resident request DO NOT EXCEED 3050m IN A 24 HOUR PERIOD    [provider]  ?calcium carbonate (TUMS EX) 750 MG chewable tablet Chew 1 tablet by mouth daily. Between the hours of 7am-10am. 09/15/18   [provider]  ?carbidopa-levodopa (SINEMET CR) 50-200 MG tablet Take 1 tablet by mouth at bedtime.    [provider]  ?carbidopa-levodopa (SINEMET IR) 25-100 MG tablet Take 2 tablets by mouth 5 (five) times daily.    [provider]  ?carbidopa-levodopa (SINEMET IR) 25-100 MG tablet Take 1 tablet by mouth once as needed. May give one additional tablet daily in addition to scheduled dose as needed for increased symptoms related to Parkinson's. For taking at 11am if needed. Do not take after 1130 or within one hour of next dose.    [provider]  ?Carboxymethylcellulose Sodium (ARTIFICIAL TEARS OP) Place 1 drop into both eyes 3 (three) times daily.    [provider]  ?Cholecalciferol (VITAMIN  D3) 10 MCG (400 UNIT) CAPS Take 1 capsule by mouth daily. Between the hours of 7am-10am.    [provider]  ?Cranberry 450 MG TABS Take 1 tablet by mouth daily.    [provider]  ?diclofenac Sodium (VOLTAREN) 1 % GEL Apply topically 3 (three) times daily as needed.    [provider]  ?entacapone (COMTAN) 200 MG tablet Take 200 mg by mouth 3 (three) times daily.    [provider]  ?ferrous sulfate 325 (65 FE) MG tablet Take 325 mg by mouth every Monday, Wednesday, and Friday. Give with food    [provider]  ?gentamicin cream (GARAMYCIN) 0.1 % Apply 1 application topically every other day.    [provider]  ?hydrocortisone (ANUSOL-HC) 25 MG suppository Place 25 mg rectally daily as needed for hemorrhoids or anal itching.    [provider]  ?methocarbamol (ROBAXIN) 500 MG tablet Take 250 mg by mouth daily. As needed    [provider]  ?midodrine (PROAMATINE) 5 MG  tablet Take 5 mg by mouth 3 (three) times daily with meals.    [provider]  ?mirabegron ER (MYRBETRIQ) 50 MG TB24 tablet Take 50 mg by mouth daily.    [provider]  ?Nutritional Supplements (NUTRITIONAL SUPPLEMENT PO) Take by mouth daily in the afternoon. BOOST    [provider]  ?pantoprazole (PROTONIX) 40 MG tablet Take 40 mg by mouth daily.    [provider]  ?polyethylene glycol (MIRALAX / GLYCOLAX) 17 g packet Take 17 g by mouth daily.    [provider]  ?rivaroxaban (XARELTO) 20 MG TABS tablet Take 20 mg by mouth every evening. Between the hours of 5pm-6pm.    [provider]  ?simethicone (MYLICON) 80 MG chewable tablet Chew 80 mg by mouth 3 (three) times daily.    [provider]  ?Sodium Chloride 3 % AERS Place 1 spray into the nose 2 (two) times daily.    [provider]  ?Sodium Fluoride (PREVIDENT 5000 BOOSTER PLUS) 1.1 % PSTE Place 1 application onto teeth in the morning and at  bedtime.    [provider]  ?zinc oxide 20 % ointment Apply 1 application topically as needed for irritation. To buttocks after every incontinent episode and as needed for redness. May keep at bedside.

## 2021-04-12 ENCOUNTER — Non-Acute Institutional Stay (SKILLED_NURSING_FACILITY): Payer: Medicare PPO | Admitting: Nurse Practitioner

## 2021-04-12 ENCOUNTER — Encounter: Payer: Self-pay | Admitting: Nurse Practitioner

## 2021-04-12 DIAGNOSIS — D500 Iron deficiency anemia secondary to blood loss (chronic): Secondary | ICD-10-CM | POA: Diagnosis not present

## 2021-04-12 DIAGNOSIS — I480 Paroxysmal atrial fibrillation: Secondary | ICD-10-CM

## 2021-04-12 DIAGNOSIS — G903 Multi-system degeneration of the autonomic nervous system: Secondary | ICD-10-CM

## 2021-04-12 DIAGNOSIS — K5901 Slow transit constipation: Secondary | ICD-10-CM | POA: Diagnosis not present

## 2021-04-12 DIAGNOSIS — K219 Gastro-esophageal reflux disease without esophagitis: Secondary | ICD-10-CM

## 2021-04-12 DIAGNOSIS — N3281 Overactive bladder: Secondary | ICD-10-CM | POA: Diagnosis not present

## 2021-04-12 DIAGNOSIS — G20 Parkinson's disease: Secondary | ICD-10-CM | POA: Diagnosis not present

## 2021-04-12 DIAGNOSIS — Z87440 Personal history of urinary (tract) infections: Secondary | ICD-10-CM

## 2021-04-12 DIAGNOSIS — I872 Venous insufficiency (chronic) (peripheral): Secondary | ICD-10-CM

## 2021-04-12 DIAGNOSIS — M159 Polyosteoarthritis, unspecified: Secondary | ICD-10-CM

## 2021-04-12 DIAGNOSIS — I739 Peripheral vascular disease, unspecified: Secondary | ICD-10-CM

## 2021-04-12 NOTE — Progress Notes (Signed)
?Location:   [facility] ?Nursing Home Room Number: 13 A ?Place of Service:  SNF (31) ?Provider:  Dequarius Jeffries X, NP ? ?[name], MD ? ?Patient Care Team: ?[name], MD as PCP - General (Internal Medicine) ?[name], [name], MD as PCP - Cardiology (Cardiology) ?Ludwig Clarks, DO as Consulting Physician (Neurology) ?Kathe Wirick X, NP as Nurse Practitioner (Internal Medicine) ? ?Extended Emergency Contact Information ?Primary Emergency Contact: Storm Frisk ?Mobile Phone: [phone] ?Relation: Daughter ?Interpreter needed? No ?Secondary Emergency Contact: Michiels,Fred ?Address: [address] ?         [place], [place] [address] [place] ?Home Phone: [phone] ?Mobile Phone: [phone] ?Relation: Son ? ?Code Status:  DNR ?Goals of care: Advanced Directive information ? ?  04/12/2021  ?  9:50 AM  ?Advanced Directives  ?Does Patient Have a Medical Advance Directive? Yes  ?Type of Advance Directive Healthcare Power of Attorney  ?Does patient want to make changes to medical advance directive? No - Patient declined  ?Copy of [place] in Chart? Yes - validated most recent copy scanned in chart (See row information)  ? ? ? ?Chief Complaint  ?Patient presents with  ? Medical Management of Chronic Issues  ?  Routine follow up  ? Best Practice Recommendations  ?  Shingrix vaccine  ? ? ?HPI:  ?Pt is a 81 y.o. female seen today for medical management of chronic diseases.   ?  ?  Recurrent UTI, saw Urology, placed on Estrace x2/wk, TMP 124m daily. C/o suprapubic pressure/mild pain upon awake in am, resolves after urination. Needs urinate every 3-4 hours to avoid bladder discomfort.  ?            PAD 12/25/20 ABI R+L 0.7, moderate arterial disease, vascular specialist if desires.  ?            GERD, takes Protonix ?            The patient has chronic edema BLE. Off Metolazone 09/27/19. Echo EF normal in the past.  ?            Afib, heart rate is in control, on Xarelto,  off Diltiazem. Hgb 11.8 11/22/20, TSH 1.30 03/29/20 ?            OA,  on Tylenol, Methocarbamol 2516mqd.         Parkinson's, symptoms improved, on Sinemet, Comtan[facility]  ?            Orthostatic hypotension, takes Midodrine.  ?            Anemia, stable, Hgb 11.8 11/22/20,  takes Fe ?            Urge incontinent of urine, takes Mirabegron, needs urinate q3-4 hours to avoid bladder discomfort.  ?            Constipation, takes MiraLax ? ?Past Medical History:  ?Diagnosis Date  ? Acute lower UTI 09/14/2018  ? Anemia 10/15/2018  ? 2016 colonoscopy 10/14/18 wbc 5.4, Hgb 10.1, plt 184 12/01/18 wbc 4.5, Hgb 11.1, plt 188, neutrophils 63,  Na 139, K 3.7, Bun 23, creat 0.69, eGFR 83 on Fe, Hgb 11.7 12/09/18   ? Atrial fibrillation ([place]5/01/2010  ? 10/07/18 Na 143, K 4.0, Bun 20, creat 0.79, eGFR 72, wbc 7.1, Hgb 10.9, plt 172 10/28/18 Na 142, K 4.0, Bun 19, creat 0.77, eGFR 74 11/18/18 Na 144,  K 4.1, Bun 21, creat 0.69, eGFR 83  ? Bilateral lower extremity edema 07/19/2018  ? 11/02/18 BMP 2 weeks.   ? Cervical pain (neck) 06/20/2010  ? Closed fracture of part of upper end of humerus 05/01/2015  ? Colles' fracture of right radius 03/05/2015  ? Constipation 07/19/2018  ? DEGENERATIVE JOINT DISEASE, RIGHT HIP 02/16/2007  ? Dupuytren's contracture   ? Dysuria 09/20/2018  ? 09/20/18 c/o got up several times last night to go urinate, burning on urination, lower abd /back discomfort, but urinary frequency, leakage are not new. UA C/S, Pyridium 111m tid x 2 days.  09/21/18 wbc 6.1, Hgb 11.8, plt 210, neutrophil 69.4, Na 142, K 4.1, Bun 19, creat 0.78, TP 6.4, albumin 3.9  ? Hematuria 02/04/2014  ? Hypotension 09/18/2018  ? Long term (current) use of anticoagulants 05/29/2016  ? Osteoarthritis of hip   ? right  ? Osteoarthritis of left knee   ? Overactive bladder 10/06/2018  ? Parkinson disease ([place]   ? Par[place]mal A-fib ([place]   ? POSTMENOPAUSAL SYNDROME 02/16/2007  ? PREMATURE ATRIAL  CONTRACTIONS 02/16/2007  ? 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 S/P breast biopsy, left   ? two o'clock position - benign  ? Toxic effect of venom(989.5) 07/27/2007  ? Tremor, unspecified 10/19/2015  ? Unstable gait 02/16/2017  ? Vaginal atrophy 10/19/2015  ? Venous insufficiency   ? Weakness 09/12/2018  ? ?Past Surgical History:  ?Procedure Laterality Date  ? BREAST BIOPSY Left   ? CATARACT EXTRACTION, BILATERAL    ? ? ?Allergies  ?Allergen Reactions  ? Doxycycline Nausea And Vomiting  ? Sulfonamide Derivatives   ?  REACTION: HIVES  ? ? ?Allergies as of 04/12/2021   ? ?   Reactions  ? Doxycycline Nausea And Vomiting  ? Sulfonamide Derivatives   ? REACTION: HIVES  ? ?  ? ?  ?Medication List  ?  ? ?  ? Accurate as of April 12, 2021 11:59 PM. If you have any questions, ask your nurse or doctor.  ?  ?  ? ?  ? ?acetaminophen 500 MG tablet ?Commonly known as: TYLENOL ?Take 1,000 mg by mouth 3 (three) times daily as needed. ?  ?acetaminophen 500 MG tablet ?Commonly known as: TYLENOL ?Take 1,000 mg by mouth at bedtime. administer at 9:30pm per resident request DO NOT EXCEED 30064mIN A 24 HOUR PERIOD ?  ?ARTIFICIAL TEARS OP ?Place 1 drop into both eyes 3 (three) times daily. ?  ?calcium carbonate 750 MG chewable tablet ?Commonly known as: TUMS EX ?Chew 1 tablet by mouth daily. Between the hours of 7am-10am. ?  ?carbidopa-levodopa 25-100 MG tablet ?Commonly known as: SINEMET IR ?Take 2 tablets by mouth 5 (five) times daily. ?  ?carbidopa-levodopa 50-200 MG tablet ?Commonly known as: SINEMET CR ?Take 1 tablet by mouth at bedtime. ?  ?carbidopa-levodopa 25-100 MG tablet ?Commonly known as: SINEMET IR ?Take 1 tablet by mouth once as needed. May give one additional tablet daily in addition to scheduled dose as needed for increased symptoms related to Parkinson's. For taking at 11am if needed. Do not take after 1130 or within one hour of next dose. ?  ?Cranberry 450 MG Tabs ?Take 1 tablet by mouth daily. ?  ?diclofenac  Sodium 1 % Gel ?Commonly known as: VOLTAREN ?Apply topically 3 (three) times daily as needed. ?  ?entacapone 200 MG tablet ?Commonly known as: COMTAN ?Take 200 mg by mouth 3 (three) times daily. ?  ?estradiol 0.1 MG/GM vaginal cream ?  Commonly known as: ESTRACE ?Place 1 Applicatorful vaginally at bedtime. APPLY FINGERTIP LENGTH AMOUNT TO THE URETHRA 2 NIGHTS PER WEEK ?  ?ferrous sulfate 325 (65 FE) MG tablet ?Take 325 mg by mouth every Monday, Wednesday, and Friday. Give with food ?  ?gentamicin cream 0.1 % ?Commonly known as: GARAMYCIN ?Apply 1 application topically every other day. ?  ?hydrocortisone 25 MG suppository ?Commonly known as: ANUSOL-HC ?Place 25 mg rectally daily as needed for hemorrhoids or anal itching. ?  ?methocarbamol 500 MG tablet ?Commonly known as: ROBAXIN ?Take 250 mg by mouth daily. As needed ?  ?midodrine 5 MG tablet ?Commonly known as: PROAMATINE ?Take 5 mg by mouth 3 (three) times daily with meals. ?  ?mirabegron ER 50 MG Tb24 tablet ?Commonly known as: MYRBETRIQ ?Take 50 mg by mouth daily. ?  ?NUTRITIONAL SUPPLEMENT PO ?Take by mouth daily in the afternoon. BOOST ?  ?pantoprazole 40 MG tablet ?Commonly known as: PROTONIX ?Take 40 mg by mouth daily. ?  ?polyethylene glycol 17 g packet ?Commonly known as: MIRALAX / GLYCOLAX ?Take 17 g by mouth daily. ?  ?PreviDent 5000 Booster Plus 1.1 % Pste ?Generic drug: Sodium Fluoride ?Place 1 application onto teeth in the morning and at bedtime. ?  ?rivaroxaban 20 MG Tabs tablet ?Commonly known as: XARELTO ?Take 20 mg by mouth every evening. Between the hours of 5pm-6pm. ?  ?Saline 3 % Aers ?Place 1 spray into the nose 2 (two) times daily. ?  ?simethicone 80 MG chewable tablet ?Commonly known as: MYLICON ?Chew 80 mg by mouth 3 (three) times daily. ?  ?trimethoprim 100 MG tablet ?Commonly known as: TRIMPEX ?Take 100 mg by mouth at bedtime. ?  ?Vitamin D3 10 MCG (400 UNIT) Caps ?Take 1 capsule by mouth daily. Between the hours of 7am-10am. ?  ?zinc oxide  20 % ointment ?Apply 1 application topically as needed for irritation. To buttocks after every incontinent episode and as needed for redness. May keep at bedside. ?  ? ?  ? ? ?Review of Systems  ?Consti

## 2021-04-15 ENCOUNTER — Encounter: Payer: Self-pay | Admitting: Nurse Practitioner

## 2021-04-15 NOTE — Assessment & Plan Note (Signed)
Well controlled pain, on Tylenol, Methocarbamol '250mg'$  qd.  ?

## 2021-04-15 NOTE — Assessment & Plan Note (Signed)
12/25/20 ABI R+L 0.7, moderate arterial disease, vascular specialist if desires.  

## 2021-04-15 NOTE — Assessment & Plan Note (Signed)
heart rate is in control, on Xarelto, off Diltiazem. Hgb 11.8 11/22/20, TSH 1.30 03/29/20 

## 2021-04-15 NOTE — Assessment & Plan Note (Signed)
stable, Hgb 11.8 11/22/20,  takes Fe 

## 2021-04-15 NOTE — Assessment & Plan Note (Signed)
better,  takes Midodrine.  ?

## 2021-04-15 NOTE — Assessment & Plan Note (Signed)
Stable,  takes Protonix ?

## 2021-04-15 NOTE — Assessment & Plan Note (Signed)
Stable, takes MiraLax  

## 2021-04-15 NOTE — Assessment & Plan Note (Signed)
Recurrent UTI, saw Urology, placed on Estrace x2/wk, TMP '100mg'$  daily. C/o suprapubic pressure/mild pain upon awake in am, resolves after urination. Needs urinate every 3-4 hours to avoid bladder discomfort.  ?

## 2021-04-15 NOTE — Assessment & Plan Note (Signed)
,   on Sinemet, Comtan, f/u neurology at [facility] double vision, chronic, saw Ophthalmology  ?

## 2021-04-15 NOTE — Assessment & Plan Note (Signed)
Off Metolazone 09/27/19. Echo EF normal in the past.  

## 2021-04-15 NOTE — Assessment & Plan Note (Signed)
takes Mirabegron, needs urinate q3-4 hours to avoid bladder discomfort.  

## 2021-04-17 ENCOUNTER — Encounter: Payer: Self-pay | Admitting: Orthopedic Surgery

## 2021-04-17 ENCOUNTER — Non-Acute Institutional Stay (SKILLED_NURSING_FACILITY): Payer: Medicare PPO | Admitting: Orthopedic Surgery

## 2021-04-17 DIAGNOSIS — F419 Anxiety disorder, unspecified: Secondary | ICD-10-CM | POA: Diagnosis not present

## 2021-04-17 DIAGNOSIS — J302 Other seasonal allergic rhinitis: Secondary | ICD-10-CM | POA: Diagnosis not present

## 2021-04-17 NOTE — Progress Notes (Signed)
?Location:  [facility] ?Nursing Home Room Number: 13A ?Place of Service:  SNF (31) ?Provider:  Yvonna Alanis, NP  ? ?[name], MD ? ?Patient Care Team: ?[name], MD as PCP - General (Internal Medicine) ?[name], [name], MD as PCP - Cardiology (Cardiology) ?Ludwig Clarks, DO as Consulting Physician (Neurology) ?Mast, Man X, NP as Nurse Practitioner (Internal Medicine) ? ?Extended Emergency Contact Information ?Primary Emergency Contact: Storm Frisk ?Mobile Phone: [phone] ?Relation: Daughter ?Interpreter needed? No ?Secondary Emergency Contact: Clapper,Fred ?Address: [address] ?         [place], [place] [address] [place] ?Home Phone: [phone] ?Mobile Phone: [phone] ?Relation: Son ? ?Code Status:  DNR ?Goals of care: Advanced Directive information ? ?  04/12/2021  ?  9:50 AM  ?Advanced Directives  ?Does Patient Have a Medical Advance Directive? Yes  ?Type of Advance Directive Healthcare Power of Attorney  ?Does patient want to make changes to medical advance directive? No - Patient declined  ?Copy of [place] in Chart? Yes - validated most recent copy scanned in chart (See row information)  ? ? ? ?Chief Complaint  ?Patient presents with  ? Acute Visit  ?  anxiety  ? ? ?HPI:  ?Pt is a 81 y.o. female seen today for acute visit due to increased anxiety.  ? ?She has been having episodes of increased anxiety for the last few days. When these episodes occur she feels sob and jittery. She believes the trigger for these episodes is related to increased nasal congestion and oral secretions that have been occurring. We discussed depression and anxiety. Denies depression. Unsure if she is having panic attacks. Treatment options discussed. She would like something to decrease congestion. She is willing to try something prn for increased anxiety. Denies chest pain, cough, malaise or cold symptoms.  ? ? ?Past Medical History:  ?Diagnosis Date  ? Acute  lower UTI 09/14/2018  ? Anemia 10/15/2018  ? 2016 colonoscopy 10/14/18 wbc 5.4, Hgb 10.1, plt 184 12/01/18 wbc 4.5, Hgb 11.1, plt 188, neutrophils 63,  Na 139, K 3.7, Bun 23, creat 0.69, eGFR 83 on Fe, Hgb 11.7 12/09/18   ? Atrial fibrillation ([place]) 05/21/2010  ? 10/07/18 Na 143, K 4.0, Bun 20, creat 0.79, eGFR 72, wbc 7.1, Hgb 10.9, plt 172 10/28/18 Na 142, K 4.0, Bun 19, creat 0.77, eGFR 74 11/18/18 Na 144, K 4.1, Bun 21, creat 0.69, eGFR 83  ? Bilateral lower extremity edema 07/19/2018  ? 11/02/18 BMP 2 weeks.   ? Cervical pain (neck) 06/20/2010  ? Closed fracture of part of upper end of humerus 05/01/2015  ? Colles' fracture of right radius 03/05/2015  ? Constipation 07/19/2018  ? DEGENERATIVE JOINT DISEASE, RIGHT HIP 02/16/2007  ? Dupuytren's contracture   ? Dysuria 09/20/2018  ? 09/20/18 c/o got up several times last night to go urinate, burning on urination, lower abd /back discomfort, but urinary frequency, leakage are not new. UA C/S, Pyridium 153m tid x 2 days.  09/21/18 wbc 6.1, Hgb 11.8, plt 210, neutrophil 69.4, Na 142, K 4.1, Bun 19, creat 0.78, TP 6.4, albumin 3.9  ? Hematuria 02/04/2014  ? Hypotension 09/18/2018  ? Long term (current) use of anticoagulants 05/29/2016  ? Osteoarthritis of hip   ? right  ? Osteoarthritis of left knee   ? Overactive bladder 10/06/2018  ? [place]   ? [place]   ? POSTMENOPAUSAL SYNDROME 02/16/2007  ? 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 S/P breast biopsy, left   ? two o'clock position - benign  ? Toxic effect of venom(989.5) 07/27/2007  ? Tremor, unspecified 10/19/2015  ? Unstable gait 02/16/2017  ? Vaginal atrophy 10/19/2015  ? Venous insufficiency   ? Weakness 09/12/2018  ? ?Past Surgical History:  ?Procedure Laterality Date  ? BREAST BIOPSY Left   ? CATARACT EXTRACTION, BILATERAL    ? ? ?Allergies  ?Allergen Reactions  ? Doxycycline Nausea And Vomiting  ? Sulfonamide Derivatives   ?  REACTION: HIVES   ? ? ?Outpatient Encounter Medications as of 04/17/2021  ?Medication Sig  ? acetaminophen (TYLENOL) 500 MG tablet Take 1,000 mg by mouth 3 (three) times daily as needed.   ? acetaminophen (TYLENOL) 500 MG tablet Take 1,000 mg by mouth at bedtime. administer at 9:30pm per resident request DO NOT EXCEED 3024m IN A 24 HOUR PERIOD  ? calcium carbonate (TUMS EX) 750 MG chewable tablet Chew 1 tablet by mouth daily. Between the hours of 7am-10am.  ? carbidopa-levodopa (SINEMET CR) 50-200 MG tablet Take 1 tablet by mouth at bedtime.  ? carbidopa-levodopa (SINEMET IR) 25-100 MG tablet Take 2 tablets by mouth 5 (five) times daily.  ? carbidopa-levodopa (SINEMET IR) 25-100 MG tablet Take 1 tablet by mouth once as needed. May give one additional tablet daily in addition to scheduled dose as needed for increased symptoms related to Parkinson's. For taking at 11am if needed. Do not take after 1130 or within one hour of next dose.  ? Carboxymethylcellulose Sodium (ARTIFICIAL TEARS OP) Place 1 drop into both eyes 3 (three) times daily.  ? Cholecalciferol (VITAMIN D3) 10 MCG (400 UNIT) CAPS Take 1 capsule by mouth daily. Between the hours of 7am-10am.  ? Cranberry 450 MG TABS Take 1 tablet by mouth daily.  ? diclofenac Sodium (VOLTAREN) 1 % GEL Apply topically 3 (three) times daily as needed.  ? entacapone (COMTAN) 200 MG tablet Take 200 mg by mouth 3 (three) times daily.  ? estradiol (ESTRACE) 0.1 MG/GM vaginal cream Place 1 Applicatorful vaginally at bedtime. APPLY FINGERTIP LENGTH AMOUNT TO THE URETHRA 2 NIGHTS PER WEEK  ? ferrous sulfate 325 (65 FE) MG tablet Take 325 mg by mouth every Monday, Wednesday, and Friday. Give with food  ? gentamicin cream (GARAMYCIN) 0.1 % Apply 1 application topically every other day.  ? hydrocortisone (ANUSOL-HC) 25 MG suppository Place 25 mg rectally daily as needed for hemorrhoids or anal itching.  ? methocarbamol (ROBAXIN) 500 MG tablet Take 250 mg by mouth daily. As needed  ? midodrine  (PROAMATINE) 5 MG tablet Take 5 mg by mouth 3 (three) times daily with meals.  ? mirabegron ER (MYRBETRIQ) 50 MG TB24 tablet Take 50 mg by mouth daily.  ? Nutritional Supplements (NUTRITIONAL SUPPLEMENT PO) Take by mouth daily in the afternoon. BOOST  ? pantoprazole (PROTONIX) 40 MG tablet Take 40 mg by mouth daily.  ? polyethylene glycol (MIRALAX / GLYCOLAX) 17 g packet Take 17 g by mouth daily.  ? rivaroxaban (XARELTO) 20 MG TABS tablet Take 20 mg by mouth every evening. Between the hours of 5pm-6pm.  ? simethicone (MYLICON) 80 MG chewable tablet Chew 80 mg by mouth 3 (three) times daily.  ? Sodium Chloride 3 % AERS Place 1 spray into the nose 2 (two) times daily.  ? Sodium Fluoride (PREVIDENT 5000 BOOSTER PLUS) 1.1 % PSTE Place 1 application onto teeth in the morning and at bedtime.  ?  trimethoprim (TRIMPEX) 100 MG tablet Take 100 mg by mouth at bedtime.  ? zinc oxide 20 % ointment Apply 1 application topically as needed for irritation. To buttocks after every incontinent episode and as needed for redness. May keep at bedside.  ? ?No facility-administered encounter medications on file as of 04/17/2021.  ? ? ?Review of Systems  ?Constitutional:  Negative for chills, fatigue and fever.  ?HENT:  Positive for congestion. Negative for ear pain, sinus pressure, sinus pain and sore throat.   ?Eyes:  Negative for visual disturbance.  ?     Watery eyes  ?Respiratory:  Positive for shortness of breath. Negative for cough and wheezing.   ?Cardiovascular:  Negative for chest pain and leg swelling.  ?Gastrointestinal:  Negative for constipation, diarrhea, nausea and vomiting.  ?Musculoskeletal:  Positive for gait problem.  ?Skin:  Negative for wound.  ?Neurological:  Negative for dizziness and headaches.  ?Psychiatric/Behavioral:  Negative for confusion, dysphoric mood and sleep disturbance. The patient is nervous/anxious.   ? ?Immunization History  ?Administered Date(s) Administered  ? Influenza Split 10/20/2012  ? Influenza  Whole 10/21/2006  ? Influenza, High Dose Seasonal PF 10/17/2016, 11/02/2019  ? Influenza-Unspecified 10/20/2013, 10/22/2017, 11/07/2020  ? Moderna Sars-Covid-2 Vaccination 01/22/2019, 02/19/2019, 11/29/2019, 05/31/2

## 2021-04-18 ENCOUNTER — Emergency Department (HOSPITAL_COMMUNITY)
Admission: EM | Admit: 2021-04-18 | Discharge: 2021-04-18 | Disposition: A | Discharge status: Skilled Nursing Facility | Payer: Medicare PPO | Attending: Emergency Medicine | Admitting: Emergency Medicine

## 2021-04-18 ENCOUNTER — Other Ambulatory Visit: Payer: Self-pay

## 2021-04-18 ENCOUNTER — Encounter (HOSPITAL_COMMUNITY): Payer: Self-pay | Admitting: Emergency Medicine

## 2021-04-18 DIAGNOSIS — K0889 Other specified disorders of teeth and supporting structures: Secondary | ICD-10-CM | POA: Insufficient documentation

## 2021-04-18 DIAGNOSIS — R531 Weakness: Secondary | ICD-10-CM | POA: Diagnosis not present

## 2021-04-18 DIAGNOSIS — I4891 Unspecified atrial fibrillation: Secondary | ICD-10-CM | POA: Insufficient documentation

## 2021-04-18 DIAGNOSIS — Z79899 Other long term (current) drug therapy: Secondary | ICD-10-CM | POA: Diagnosis not present

## 2021-04-18 DIAGNOSIS — Z743 Need for continuous supervision: Secondary | ICD-10-CM | POA: Diagnosis not present

## 2021-04-18 DIAGNOSIS — R58 Hemorrhage, not elsewhere classified: Secondary | ICD-10-CM | POA: Diagnosis not present

## 2021-04-18 MED ORDER — GELATIN ABSORBABLE 12-7 MM EX MISC
1.0000 | Freq: Once | CUTANEOUS | Status: DC
  Filled 2021-04-18: qty 1

## 2021-04-18 MED ORDER — CARBIDOPA-LEVODOPA 25-100 MG PO TABS
2.0000 | ORAL_TABLET | Freq: Every day | ORAL | Status: DC
  Administered 2021-04-18 (×2): 2 via ORAL
  Filled 2021-04-18 (×4): qty 2

## 2021-04-18 MED ORDER — CARBIDOPA-LEVODOPA ER 50-200 MG PO TBCR
1.0000 | EXTENDED_RELEASE_TABLET | Freq: Once | ORAL | Status: AC
  Administered 2021-04-18: 1 via ORAL
  Filled 2021-04-18: qty 1

## 2021-04-18 MED ORDER — CARBIDOPA-LEVODOPA 25-100 MG PO TBDP: 2.0000 | ORAL_TABLET | Freq: Every day | ORAL | Status: DC

## 2021-04-18 MED ORDER — THROMBIN 20000 UNITS EX KIT
20000.0000 [IU] | PACK | Freq: Once | CUTANEOUS | Status: DC
  Filled 2021-04-18 (×2): qty 1

## 2021-04-18 MED ORDER — GELATIN ABSORBABLE 12-7 MM EX MISC
1.0000 | Freq: Once | CUTANEOUS | Status: AC
Start: 2021-04-18 — End: 2021-04-18
  Administered 2021-04-18: 1 via TOPICAL
  Filled 2021-04-18: qty 1

## 2021-04-18 MED ORDER — TRANEXAMIC ACID FOR EPISTAXIS
500.0000 mg | Freq: Once | TOPICAL | Status: AC
  Administered 2021-04-18: 500 mg via TOPICAL
  Filled 2021-04-18: qty 10

## 2021-04-18 MED ORDER — CARBIDOPA-LEVODOPA 10-100MG/5ML ORAL SUSPENSION
10.0000 mL | Freq: Every day | ORAL | Status: DC
  Filled 2021-04-18 (×3): qty 10

## 2021-04-18 NOTE — ED Provider Notes (Signed)
?[facility] ?Provider Note ? ? ?CSN: [id number] ?Arrival date & time: 04/18/21  2449 ? ?  ? ?History ? ?Chief Complaint  ?Patient presents with  ? Dental Injury  ? ? ?Stephanie Shaw is a 81 y.o. female. ? ?Patient here with bleeding from tooth extraction site.  History of atrial fibrillation on xarleto.  Tooth was removed several days ago.  Has restarted blood thinner.  Spontaneous bleeding occurred overnight.  They were able to stop the bleeding once or twice.  But having a hard time getting to fully stop.  Denies any trauma.  Denies any fevers or chills. ? ?The history is provided by the patient.  ? ?  ? ?Home Medications ?Prior to Admission medications   ?Medication Sig Start Date End Date Taking? Authorizing Provider  ?acetaminophen (TYLENOL) 500 MG tablet Take 1,000 mg by mouth at bedtime.   Yes [provider]  ?calcium carbonate (TUMS EX) 750 MG chewable tablet Chew 1 tablet by mouth daily. 09/15/18  Yes [provider]  ?carbidopa-levodopa (PARCOPA) 25-100 MG disintegrating tablet Take 2 tablets by mouth 5 (five) times daily.   Yes [provider]  ?carbidopa-levodopa (PARCOPA) 25-100 MG disintegrating tablet Take 1 tablet by mouth See admin instructions. May give one additional tablet daily in addition to scheduled dose as needed for increased symptoms related to Parkinson's. For taking at 11am if needed. Do not take after 1130 or within one hour of next dose.   Yes [provider]  ?carbidopa-levodopa (SINEMET CR) 50-200 MG tablet Take 1 tablet by mouth at bedtime.   Yes [provider]  ?cetirizine (ZYRTEC) 10 MG tablet Take 10 mg by mouth at bedtime.   Yes [provider]  ?Cholecalciferol (VITAMIN D3) 10 MCG (400 UNIT) CAPS Take 1 capsule by mouth daily.   Yes [provider]  ?Cranberry 450 MG TABS Take 1 tablet by mouth daily.   Yes [provider]  ?diclofenac Sodium (VOLTAREN) 1 % GEL Apply  2 g topically 3 (three) times daily as needed (Neck pain).   Yes [provider]  ?entacapone (COMTAN) 200 MG tablet Take 200 mg by mouth 3 (three) times daily.   Yes [provider]  ?estradiol (ESTRACE) 0.1 MG/GM vaginal cream Place 1 Applicatorful vaginally 2 (two) times a week. Monday's and Friday's   Yes [provider]  ?ferrous sulfate 325 (65 FE) MG tablet Take 325 mg by mouth every Monday, Wednesday, and Friday.   Yes [provider]  ?hydrocortisone (ANUSOL-HC) 2.5 % rectal cream Place 1 application. rectally daily as needed for hemorrhoids.   Yes [provider]  ?hydrOXYzine (ATARAX) 25 MG tablet Take 25 mg by mouth 2 (two) times daily as needed for anxiety. 04/17/21  Yes [provider]  ?lactose free nutrition (BOOST) LIQD Take 237 mLs by mouth daily.   Yes [provider]  ?methocarbamol (ROBAXIN) 500 MG tablet Take 250 mg by mouth daily as needed for muscle spasms.   Yes [provider]  ?midodrine (PROAMATINE) 5 MG tablet Take 5 mg by mouth in the morning, at noon, and at bedtime.   Yes [provider]  ?mirabegron ER (MYRBETRIQ) 50 MG TB24 tablet Take 50 mg by mouth daily.   Yes [provider]  ?pantoprazole (PROTONIX) 40 MG tablet Take 40 mg by mouth daily.   Yes [provider]  ?polyethylene glycol (MIRALAX / GLYCOLAX) 17 g packet Take 17 g by mouth 2 (two)  times daily.   Yes [provider]  ?polyvinyl alcohol (LIQUIFILM TEARS) 1.4 % ophthalmic solution Place 1 drop into both eyes 3 (three) times daily as needed for dry eyes.   Yes [provider]  ?rivaroxaban (XARELTO) 20 MG TABS tablet Take 20 mg by mouth daily with supper.   Yes [provider]  ?Saline (ARY NASAL MIST ALLERGY/SINUS) 2.65 % SOLN Place 1 spray into both nostrils in the morning and at bedtime.   Yes [provider]  ?simethicone (MYLICON) 80 MG chewable tablet Chew 80 mg by mouth with  breakfast, with lunch, and with evening meal.   Yes [provider]  ?sodium fluoride (PREVIDENT 5000 PLUS) 1.1 % CREA dental cream Place 1 application. onto teeth in the morning and at bedtime.   Yes [provider]  ?trimethoprim (TRIMPEX) 100 MG tablet Take 100 mg by mouth at bedtime.   Yes [provider]  ?zinc oxide 20 % ointment Apply 1 application. topically as needed for irritation. Apply to buttocks after every incontinent episode and for redness   Yes [provider]  ?   ? ?Allergies    ?Doxycycline and Sulfonamide derivatives   ? ?Review of Systems   ?Review of Systems ? ?Physical Exam ?Updated Vital Signs ? ?ED Triage Vitals  ?Enc Vitals Group  ?   BP 04/18/21 0801 (!) 154/83  ?   Pulse Rate 04/18/21 0801 73  ?   Resp 04/18/21 0801 20  ?   Temp 04/18/21 0801 97.7 ?F (36.5 ?C)  ?   Temp Source 04/18/21 0801 Axillary  ?   SpO2 04/18/21 0801 97 %  ?   Weight 04/18/21 0802 127 lb 13.9 oz (58 kg)  ?   Height 04/18/21 0802 '5\' 8"'$  (1.727 m)  ?   Head Circumference --   ?   Peak Flow --   ?   Pain Score --   ?   Pain Loc --   ?   Pain Edu? --   ?   Excl. in [place]? --   ? ? ?Physical Exam ?Vitals and nursing note reviewed.  ?Constitutional:   ?   General: She is not in acute distress. ?   Appearance: She is well-developed. She is not ill-appearing.  ?HENT:  ?   Head: Normocephalic and atraumatic.  ?   Nose: Nose normal.  ?   Mouth/Throat:  ?   Mouth: Mucous membranes are moist.  ?   Comments: There is some small amount of bleeding around the well-developed clot within the frontal tooth socket a prior extraction site, there is no trismus or drooling or submandibular swelling ?Eyes:  ?   Extraocular Movements: Extraocular movements intact.  ?   Conjunctiva/sclera: Conjunctivae normal.  ?   Pupils: Pupils are equal, round, and reactive to light.  ?Cardiovascular:  ?   Rate and Rhythm: Normal rate and regular rhythm.  ?   Pulses: Normal pulses.  ?   Heart sounds: No murmur  heard. ?Pulmonary:  ?   Effort: Pulmonary effort is normal. No respiratory distress.  ?   Breath sounds: Normal breath sounds.  ?Abdominal:  ?   Palpations: Abdomen is soft.  ?   Tenderness: There is no abdominal tenderness.  ?Musculoskeletal:     ?   General: No swelling.  ?   Cervical back: Neck supple.  ?Skin: ?   General: Skin is warm and dry.  ?   Capillary Refill: Capillary refill takes less  than 2 seconds.  ?Neurological:  ?   Mental Status: She is alert.  ?Psychiatric:     ?   Mood and Affect: Mood normal.  ? ? ?ED Results / Procedures / Treatments   ?Labs ?(all labs ordered are listed, but only abnormal results are displayed) ?Labs Reviewed - No data to display ? ?EKG ?None ? ?Radiology ?No results found. ? ?Procedures ?Dental Block ? ?Date/Time: 04/18/2021 10:24 AM ?Performed by: Lennice Sites, DO ?Authorized by: Lennice Sites, DO  ? ?Consent:  ?  Consent obtained:  Verbal ?  Consent given by:  Patient ?  Risks, benefits, and alternatives were discussed: yes   ?  Risks discussed:  Allergic reaction, hematoma, infection, intravascular injection, nerve damage, pain, swelling and unsuccessful block ?  Alternatives discussed:  No treatment ?Universal protocol:  ?  Procedure explained and questions answered to patient or proxy's satisfaction: yes   ?  Relevant documents present and verified: yes   ?  Patient identity confirmed:  Verbally with patient ?Indications:  ?  Indications: dental pain   ?  Indications comment:  Bleeding after tooth extraction ?Location:  ?  Block type:  Inferior alveolar ?  Laterality:  Left ?Procedure details:  ?  Syringe type:  Aspirating dental syringe ?  Needle gauge:  27 G ?  Anesthetic injected:  Bupivacaine 0.25% w/o epi ?  Injection procedure:  Anatomic landmarks identified, introduced needle, incremental injection, anatomic landmarks palpated and negative aspiration for blood ?Post-procedure details:  ?  Outcome:  Pain improved ?  Procedure completion:  Tolerated   ? ? ?Medications Ordered in ED ?Medications  ?thrombin spray 20,000 Units (has no administration in time range)  ?gelatin adsorbable (GELFOAM/SURGIFOAM) sponge 12-7 mm 1 each (has no administration in time range)  ?carbidopa-levod

## 2021-04-18 NOTE — ED Triage Notes (Signed)
Pt BIB [facility] from [place] [place] [facility] facility. Pt had a dental extraction done on Monday of this week with no issue. Staff noted bleeding from extraction site started yesterday afternoon however staff able to control bleeding. It started again at 0400 this AM and staff unable to have bleeding stop. EMS packed site with gauze and doing intermittent suctioning. Pt in on Eliquis and taking as normal. All VSS during EMS care.  ?

## 2021-04-18 NOTE — Discharge Instructions (Addendum)
Surgical site has been packed.  Packing should dissolve on its own.  Follow-up with your dentist if needed.  Please return if you have any other issues. Ok resume to resume [employer].  ?

## 2021-04-18 NOTE — ED Notes (Signed)
The bleeding from the pt's mouth is controlled at the moment. ?

## 2021-04-19 ENCOUNTER — Non-Acute Institutional Stay (SKILLED_NURSING_FACILITY): Payer: Medicare PPO | Admitting: Nurse Practitioner

## 2021-04-19 ENCOUNTER — Encounter: Payer: Self-pay | Admitting: Nurse Practitioner

## 2021-04-19 DIAGNOSIS — J302 Other seasonal allergic rhinitis: Secondary | ICD-10-CM

## 2021-04-19 DIAGNOSIS — G20 Parkinson's disease: Secondary | ICD-10-CM

## 2021-04-19 DIAGNOSIS — I480 Paroxysmal atrial fibrillation: Secondary | ICD-10-CM | POA: Diagnosis not present

## 2021-04-19 DIAGNOSIS — N3281 Overactive bladder: Secondary | ICD-10-CM

## 2021-04-19 DIAGNOSIS — I739 Peripheral vascular disease, unspecified: Secondary | ICD-10-CM

## 2021-04-19 DIAGNOSIS — F419 Anxiety disorder, unspecified: Secondary | ICD-10-CM

## 2021-04-19 DIAGNOSIS — K5901 Slow transit constipation: Secondary | ICD-10-CM | POA: Diagnosis not present

## 2021-04-19 DIAGNOSIS — K219 Gastro-esophageal reflux disease without esophagitis: Secondary | ICD-10-CM

## 2021-04-19 DIAGNOSIS — G20C Parkinsonism, unspecified: Secondary | ICD-10-CM

## 2021-04-19 DIAGNOSIS — M159 Polyosteoarthritis, unspecified: Secondary | ICD-10-CM | POA: Diagnosis not present

## 2021-04-19 DIAGNOSIS — D500 Iron deficiency anemia secondary to blood loss (chronic): Secondary | ICD-10-CM

## 2021-04-19 DIAGNOSIS — G903 Multi-system degeneration of the autonomic nervous system: Secondary | ICD-10-CM | POA: Diagnosis not present

## 2021-04-19 DIAGNOSIS — I872 Venous insufficiency (chronic) (peripheral): Secondary | ICD-10-CM

## 2021-04-19 DIAGNOSIS — M150 Primary generalized (osteo)arthritis: Secondary | ICD-10-CM

## 2021-04-19 NOTE — Progress Notes (Signed)
?Location:   [facility] ?Nursing Home Room Number: 13 A ?Place of Service:  SNF (31) ?Provider:  Naliah Eddington X, NP ? ?[name], MD ? ?Patient Care Team: ?[name], MD as PCP - General (Internal Medicine) ?[name], [name], MD as PCP - Cardiology (Cardiology) ?Ludwig Clarks, DO as Consulting Physician (Neurology) ?Ailine Hefferan X, NP as Nurse Practitioner (Internal Medicine) ? ?Extended Emergency Contact Information ?Primary Emergency Contact: Storm Frisk ?Mobile Phone: [phone] ?Relation: Daughter ?Interpreter needed? No ?Secondary Emergency Contact: Arrasmith,Fred ?Address: [address] ?         [place], [place] [address] [place] ?Home Phone: [phone] ?Mobile Phone: [phone] ?Relation: Son ? ?Code Status:  DNR ?Goals of care: Advanced Directive information ? ?  04/18/2021  ?  8:05 AM  ?Advanced Directives  ?Does Patient Have a Medical Advance Directive? Yes  ?Type of Paramedic of [place];Out of facility DNR (pink MOST or yellow form)  ?Does patient want to make changes to medical advance directive? No - Patient declined  ? ? ? ?Chief Complaint  ?Patient presents with  ?? Follow-up  ?  ER follow up evaluation bleeding after tooth extraction  ? ? ?HPI:  ?Pt is a 81 y.o. female seen today for an acute visit for f/u ED evaluation for bleeding after tooth extraction ? ED eval 04/18/21 for bleeding from tooth extraction site, stopped by dental block ? Allergic rhinitis, Zyrtec x 14 days started 04/17/21 ? Anxiety, prn Hydroxyzine 9m bid prn x 14 days started 04/17/21 ?            PAD 12/25/20 ABI R+L 0.7, moderate arterial disease, vascular specialist if desires.  ?            GERD, takes Protonix ?            The patient has chronic edema BLE. Off Metolazone 09/27/19. Echo EF normal in the past.  ?            Afib, heart rate is in control, on Xarelto, off Diltiazem. Hgb 11.8 11/22/20, TSH 1.30 03/29/20 ?            OA,  on Tylenol, Methocarbamol  2592mqd.           Parkinson's, symptoms improved, on Sinemet, Comtan[facility]  ?            Orthostatic hypotension, takes Midodrine.  ?            Anemia, stable, Hgb 11.8 11/22/20,  takes Fe ?            Urge incontinent of urine, takes Mirabegron, needs urinate q3-4 hours to avoid bladder discomfort.  ? Recurrent UTI, saw Urology, placed on Estrace x2/wk, TMP 10048maily ?            Constipation, takes MiraLax ? ?Past Medical History:  ?Diagnosis Date  ?? Acute lower UTI 09/14/2018  ?? Anemia 10/15/2018  ? 2016 colonoscopy 10/14/18 wbc 5.4, Hgb 10.1, plt 184 12/01/18 wbc 4.5, Hgb 11.1, plt 188, neutrophils 63,  Na 139, K 3.7, Bun 23, creat 0.69, eGFR 83 on Fe, Hgb 11.7 12/09/18   ?? Atrial fibrillation ([facility]/01/2010  ? 10/07/18 Na 143, K 4.0, Bun 20, creat 0.79, eGFR 72, wbc 7.1, Hgb 10.9, plt 172 10/28/18 Na 142, K 4.0, Bun 19, creat 0.77, eGFR 74 11/18/18 Na 144, K 4.1, Bun 21,  creat 0.69, eGFR 83  ?? Bilateral lower extremity edema 07/19/2018  ? 11/02/18 BMP 2 weeks.   ?? Cervical pain (neck) 06/20/2010  ?? Closed fracture of part of upper end of humerus 05/01/2015  ?? Colles' fracture of right radius 03/05/2015  ?? Constipation 07/19/2018  ?? DEGENERATIVE JOINT DISEASE, RIGHT HIP 02/16/2007  ?? Dupuytren's contracture   ?? Dysuria 09/20/2018  ? 09/20/18 c/o got up several times last night to go urinate, burning on urination, lower abd /back discomfort, but urinary frequency, leakage are not new. UA C/S, Pyridium 181m tid x 2 days.  09/21/18 wbc 6.1, Hgb 11.8, plt 210, neutrophil 69.4, Na 142, K 4.1, Bun 19, creat 0.78, TP 6.4, albumin 3.9  ?? Hematuria 02/04/2014  ?? Hypotension 09/18/2018  ?? Long term (current) use of anticoagulants 05/29/2016  ?? Osteoarthritis of hip   ? right  ?? Osteoarthritis of left knee   ?? Overactive bladder 10/06/2018  ?? Parkinson disease ([facility]   ?? Paroxysmal A-fib ([facility]   ?? POSTMENOPAUSAL SYNDROME 02/16/2007  ?? PREMATURE ATRIAL  CONTRACTIONS 02/16/2007  ?? 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 S/P breast biopsy, left   ? two o'clock position - benign  ?? Toxic effect of venom(989.5) 07/27/2007  ?? Tremor, unspecified 10/19/2015  ?? Unstable gait 02/16/2017  ?? Vaginal atrophy 10/19/2015  ?? Venous insufficiency   ?? Weakness 09/12/2018  ? ?Past Surgical History:  ?Procedure Laterality Date  ?? BREAST BIOPSY Left   ?? CATARACT EXTRACTION, BILATERAL    ? ? ?Allergies  ?Allergen Reactions  ?? Doxycycline Other (See Comments)  ?  Unknown  ?? Sulfonamide Derivatives Other (See Comments)  ?  Unknown   ? ? ?Allergies as of 04/19/2021   ? ?   Reactions  ? Doxycycline Other (See Comments)  ? Unknown  ? Sulfonamide Derivatives Other (See Comments)  ? Unknown   ? ?  ? ?  ?Medication List  ?  ? ?  ? Accurate as of April 19, 2021 11:59 PM. If you have any questions, ask your nurse or doctor.  ?  ?  ? ?  ? ?acetaminophen 500 MG tablet ?Commonly known as: TYLENOL ?Take 1,000 mg by mouth at bedtime. ?  ?ARY NASAL MIST ALLERGY/SINUS 2.65 % Soln ?Generic drug: Saline ?Place 1 spray into both nostrils in the morning and at bedtime. ?  ?calcium carbonate 750 MG chewable tablet ?Commonly known as: TUMS EX ?Chew 1 tablet by mouth daily. ?  ?carbidopa-levodopa 50-200 MG tablet ?Commonly known as: SINEMET CR ?Take 1 tablet by mouth at bedtime. ?  ?carbidopa-levodopa 25-100 MG disintegrating tablet ?Commonly known as: PARCOPA ?Take 2 tablets by mouth 5 (five) times daily. ?  ?carbidopa-levodopa 25-100 MG disintegrating tablet ?Commonly known as: PARCOPA ?Take 1 tablet by mouth See admin instructions. May give one additional tablet daily in addition to scheduled dose as needed for increased symptoms related to Parkinson's. For taking at 11am if needed. Do not take after 1130 or within one hour of next dose. ?  ?cetirizine 10 MG tablet ?Commonly known as: ZYRTEC ?Take 10 mg by mouth at bedtime. ?  ?Cranberry 450 MG Tabs ?Take 1 tablet by mouth daily. ?   ?diclofenac Sodium 1 % Gel ?Commonly known as: VOLTAREN ?Apply 2 g topically 3 (three) times daily as needed (Neck pain). ?  ?entacapone 200 MG tablet ?Commonly known as: COMTAN ?Take 200 mg by mouth 3 (three) times daily. ?  ?estradiol 0.1 MG/GM vaginal cream ?Commonly known as: ESTRACE ?  Place 1 Applicatorful vaginally 2 (two) times a week. Monday's and Friday's ?  ?ferrous sulfate 325 (65 FE) MG tablet ?Take 325 mg by mouth every Monday, Wednesday, and Friday. ?  ?hydrocortisone 2.5 % rectal cream ?Commonly known as: ANUSOL-HC ?Place 1 application. rectally daily as needed for hemorrhoids. ?  ?hydrOXYzine 25 MG tablet ?Commonly known as: ATARAX ?Take 25 mg by mouth 2 (two) times daily as needed for anxiety. ?  ?lactose free nutrition Liqd ?Take 237 mLs by mouth daily. ?  ?methocarbamol 500 MG tablet ?Commonly known as: ROBAXIN ?Take 250 mg by mouth daily as needed for muscle spasms. ?  ?midodrine 5 MG tablet ?Commonly known as: PROAMATINE ?Take 5 mg by mouth in the morning, at noon, and at bedtime. ?  ?mirabegron ER 50 MG Tb24 tablet ?Commonly known as: MYRBETRIQ ?Take 50 mg by mouth daily. ?  ?pantoprazole 40 MG tablet ?Commonly known as: PROTONIX ?Take 40 mg by mouth daily. ?  ?polyethylene glycol 17 g packet ?Commonly known as: MIRALAX / GLYCOLAX ?Take 17 g by mouth 2 (two) times daily. ?  ?polyvinyl alcohol 1.4 % ophthalmic solution ?Commonly known as: LIQUIFILM TEARS ?Place 1 drop into both eyes 3 (three) times daily as needed for dry eyes. ?  ?rivaroxaban 20 MG Tabs tablet ?Commonly known as: XARELTO ?Take 20 mg by mouth daily with supper. ?  ?simethicone 80 MG chewable tablet ?Commonly known as: MYLICON ?Chew 80 mg by mouth with breakfast, with lunch, and with evening meal. ?  ?sodium fluoride 1.1 % Crea dental cream ?Commonly known as: PREVIDENT 5000 PLUS ?Place 1 application. onto teeth in the morning and at bedtime. ?  ?trimethoprim 100 MG tablet ?Commonly known as: TRIMPEX ?Take 100 mg by mouth at  bedtime. ?  ?Vitamin D3 10 MCG (400 UNIT) Caps ?Take 1 capsule by mouth daily. ?  ?zinc oxide 20 % ointment ?Apply 1 application. topically as needed for irritation. Apply to buttocks after every incontinent episod

## 2021-04-23 ENCOUNTER — Encounter: Payer: Self-pay | Admitting: Nurse Practitioner

## 2021-04-23 DIAGNOSIS — F419 Anxiety disorder, unspecified: Secondary | ICD-10-CM | POA: Insufficient documentation

## 2021-04-23 DIAGNOSIS — J309 Allergic rhinitis, unspecified: Secondary | ICD-10-CM | POA: Insufficient documentation

## 2021-04-23 NOTE — Assessment & Plan Note (Signed)
Doing better,  Zyrtec x 14 days started 04/17/21 ?

## 2021-04-23 NOTE — Assessment & Plan Note (Signed)
Stable, takes Protonix ?

## 2021-04-23 NOTE — Assessment & Plan Note (Signed)
Stable,  takes Midodrine.  

## 2021-04-23 NOTE — Assessment & Plan Note (Signed)
stable, Hgb 11.8 11/22/20,  takes Fe 

## 2021-04-23 NOTE — Assessment & Plan Note (Signed)
Stable, takes MiraLax  

## 2021-04-23 NOTE — Assessment & Plan Note (Signed)
12/25/20 ABI R+L 0.7, moderate arterial disease, vascular specialist if desires.  

## 2021-04-23 NOTE — Assessment & Plan Note (Signed)
symptoms improved, on Sinemet, Comtan, f/u neurology at [facility]. double vision, chronic, saw Ophthalmology  ?

## 2021-04-23 NOTE — Assessment & Plan Note (Signed)
chronic edema BLE. Off Metolazone 09/27/19. Echo EF normal in the past.  ?

## 2021-04-23 NOTE — Assessment & Plan Note (Signed)
Urge incontinent of urine, takes Mirabegron, needs urinate q3-4 hours to avoid bladder discomfort.              Recurrent UTI, saw Urology, placed on Estrace x2/wk, TMP 100mg daily 

## 2021-04-23 NOTE — Assessment & Plan Note (Signed)
heart rate is in control, on Xarelto, off Diltiazem. Hgb 11.8 11/22/20, TSH 1.30 03/29/20 

## 2021-04-23 NOTE — Assessment & Plan Note (Signed)
prn Hydroxyzine '25mg'$  bid prn x 14 days started 04/17/21 ?

## 2021-04-23 NOTE — Assessment & Plan Note (Signed)
on Tylenol, Methocarbamol '250mg'$  qd.      ?

## 2021-05-08 DIAGNOSIS — I951 Orthostatic hypotension: Secondary | ICD-10-CM | POA: Diagnosis not present

## 2021-05-08 DIAGNOSIS — G20 Parkinson's disease: Secondary | ICD-10-CM | POA: Diagnosis not present

## 2021-05-08 DIAGNOSIS — Z79899 Other long term (current) drug therapy: Secondary | ICD-10-CM | POA: Diagnosis not present

## 2021-05-09 ENCOUNTER — Encounter: Payer: Self-pay | Admitting: Internal Medicine

## 2021-05-13 ENCOUNTER — Non-Acute Institutional Stay (SKILLED_NURSING_FACILITY): Payer: Medicare PPO | Admitting: Nurse Practitioner

## 2021-05-13 ENCOUNTER — Encounter: Payer: Self-pay | Admitting: Nurse Practitioner

## 2021-05-13 DIAGNOSIS — K219 Gastro-esophageal reflux disease without esophagitis: Secondary | ICD-10-CM

## 2021-05-13 DIAGNOSIS — I872 Venous insufficiency (chronic) (peripheral): Secondary | ICD-10-CM | POA: Diagnosis not present

## 2021-05-13 DIAGNOSIS — R0602 Shortness of breath: Secondary | ICD-10-CM | POA: Insufficient documentation

## 2021-05-13 DIAGNOSIS — I480 Paroxysmal atrial fibrillation: Secondary | ICD-10-CM | POA: Diagnosis not present

## 2021-05-13 DIAGNOSIS — G903 Multi-system degeneration of the autonomic nervous system: Secondary | ICD-10-CM | POA: Diagnosis not present

## 2021-05-13 DIAGNOSIS — I739 Peripheral vascular disease, unspecified: Secondary | ICD-10-CM

## 2021-05-13 DIAGNOSIS — F419 Anxiety disorder, unspecified: Secondary | ICD-10-CM | POA: Diagnosis not present

## 2021-05-13 DIAGNOSIS — G20 Parkinson's disease: Secondary | ICD-10-CM

## 2021-05-13 DIAGNOSIS — M159 Polyosteoarthritis, unspecified: Secondary | ICD-10-CM

## 2021-05-13 DIAGNOSIS — N3281 Overactive bladder: Secondary | ICD-10-CM

## 2021-05-13 DIAGNOSIS — K5901 Slow transit constipation: Secondary | ICD-10-CM

## 2021-05-13 DIAGNOSIS — Z87440 Personal history of urinary (tract) infections: Secondary | ICD-10-CM

## 2021-05-13 DIAGNOSIS — D500 Iron deficiency anemia secondary to blood loss (chronic): Secondary | ICD-10-CM

## 2021-05-13 NOTE — Assessment & Plan Note (Signed)
Stable, , takes Mirabegron, needs urinate q3-4 hours to avoid bladder discomfort.  ?

## 2021-05-13 NOTE — Assessment & Plan Note (Signed)
on Sinemet, Comtan, started Neupro a week ago, f/u neurology at [facility]. double vision, chronic, saw Ophthalmology  ?

## 2021-05-13 NOTE — Assessment & Plan Note (Signed)
Stable, takes MiraLax  

## 2021-05-13 NOTE — Assessment & Plan Note (Signed)
on Tylenol, Methocarbamol '250mg'$  qd.  ?

## 2021-05-13 NOTE — Assessment & Plan Note (Signed)
takes Protonix, Simethicone.  

## 2021-05-13 NOTE — Assessment & Plan Note (Signed)
saw Urology, placed on Estrace x2/wk, TMP 100mg daily 

## 2021-05-13 NOTE — Assessment & Plan Note (Signed)
heart rate is in control, on Xarelto, off Diltiazem. Hgb 11.8 11/22/20, TSH 1.30 03/29/20 

## 2021-05-13 NOTE — Assessment & Plan Note (Signed)
chronic, situational, had prn Hydroxyzine in the past.  

## 2021-05-13 NOTE — Progress Notes (Signed)
?Location:  [facility] ?Nursing Home Room Number: NO/13/A ?Place of Service:  SNF (31) ?Provider:  Evangelyn Crouse X, NP ? ?[name], MD ? ?Patient Care Team: ?[name], MD as PCP - General (Internal Medicine) ?[name], [name], MD as PCP - Cardiology (Cardiology) ?Ludwig Clarks, DO as Consulting Physician (Neurology) ?Jahden Schara X, NP as Nurse Practitioner (Internal Medicine) ? ?Extended Emergency Contact Information ?Primary Emergency Contact: Storm Frisk ?Mobile Phone: [phone] ?Relation: Daughter ?Interpreter needed? No ?Secondary Emergency Contact: Maciolek,Fred ?Address: [address] ?         [place], [place] [address] [place] ?Home Phone: [phone] ?Mobile Phone: [phone] ?Relation: Son ? ?Code Status:  FULL ?Goals of care: Advanced Directive information ? ?  05/13/2021  ? 11:38 AM  ?Advanced Directives  ?Does Patient Have a Medical Advance Directive? Yes  ?Type of Advance Directive Healthcare Power of Attorney  ?Does patient want to make changes to medical advance directive? No - Patient declined  ?Copy of [place] in Chart? Yes - validated most recent copy scanned in chart (See row information)  ? ? ? ?Chief Complaint  ?Patient presents with  ? Acute Visit  ?  Patient is being seen for difficulty breathing  ? ? ?HPI:  ?Pt is a 81 y.o. female seen today for an acute visit for reported the patient's c/o she can not breathe, having SOB, it feels like her food is just sitting at the top of her stomach. No O2 desaturation. The patient denied cough, chest pain/pressure,  palpitation, nausea, vomiting, constipation, abd pain, or trouble swallowing. The patient declined ED eval, wants to lie down to relax.  ?  Anxiety, chronic, situational, had prn Hydroxyzine in the past.  ?            PAD 12/25/20 ABI R+L 0.7, moderate arterial disease, vascular specialist if desires.  ?            GERD, takes Protonix, Simethicone.  ?            The patient has  chronic edema BLE. Off Metolazone 09/27/19. Echo EF normal in the past.  ?            Afib, heart rate is in control, on Xarelto, off Diltiazem. Hgb 11.8 11/22/20, TSH 1.30 03/29/20 ?            OA,  on Tylenol, Methocarbamol 252m qd.  ?Parkinson's, symptoms improved, on Sinemet, Comtan, started Neupro 05/08/21,  [facility], saw Ophthalmology  ?            Orthostatic hypotension, takes Midodrine.  ?            Anemia, stable, Hgb 11.8 11/22/20,  takes Fe ?            Urge incontinent of urine, takes Mirabegron, needs urinate q3-4 hours to avoid bladder discomfort.  ?            Recurrent UTI, saw Urology, placed on Estrace x2/wk, TMP 1053mdaily ?            Constipation, takes MiraLax ?  ?  ? ? ?Past Medical History:  ?Diagnosis Date  ? Acute lower UTI 09/14/2018  ? Anemia 10/15/2018  ? 2016 colonoscopy 10/14/18 wbc 5.4, Hgb 10.1, plt 184 12/01/18 wbc 4.5, Hgb 11.1, plt 188, neutrophils 63,  Na 139, K 3.7, Bun 23, creat 0.69, eGFR 83 on Fe,  Hgb 11.7 12/09/18   ? Atrial fibrillation ([place]) 05/21/2010  ? 10/07/18 Na 143, K 4.0, Bun 20, creat 0.79, eGFR 72, wbc 7.1, Hgb 10.9, plt 172 10/28/18 Na 142, K 4.0, Bun 19, creat 0.77, eGFR 74 11/18/18 Na 144, K 4.1, Bun 21, creat 0.69, eGFR 83  ? Bilateral lower extremity edema 07/19/2018  ? 11/02/18 BMP 2 weeks.   ? Cervical pain (neck) 06/20/2010  ? Closed fracture of part of upper end of humerus 05/01/2015  ? Colles' fracture of right radius 03/05/2015  ? Constipation 07/19/2018  ? DEGENERATIVE JOINT DISEASE, RIGHT HIP 02/16/2007  ? Dupuytren's contracture   ? Dysuria 09/20/2018  ? 09/20/18 c/o got up several times last night to go urinate, burning on urination, lower abd /back discomfort, but urinary frequency, leakage are not new. UA C/S, Pyridium 170m tid x 2 days.  09/21/18 wbc 6.1, Hgb 11.8, plt 210, neutrophil 69.4, Na 142, K 4.1, Bun 19, creat 0.78, TP 6.4, albumin 3.9  ? Hematuria 02/04/2014  ? Hypotension 09/18/2018  ? Long term (current) use of  anticoagulants 05/29/2016  ? Osteoarthritis of hip   ? right  ? Osteoarthritis of left knee   ? Overactive bladder 10/06/2018  ? Parkinson disease ([facility]   ? Paroxysmal A-fib ([facility]   ? POSTMENOPAUSAL SYNDROME 02/16/2007  ? PREMATURE ATRIAL CONTRACTIONS 02/16/2007  ? 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 S/P breast biopsy, left   ? two o'clock position - benign  ? Toxic effect of venom(989.5) 07/27/2007  ? Tremor, unspecified 10/19/2015  ? Unstable gait 02/16/2017  ? Vaginal atrophy 10/19/2015  ? Venous insufficiency   ? Weakness 09/12/2018  ? ?Past Surgical History:  ?Procedure Laterality Date  ? BREAST BIOPSY Left   ? CATARACT EXTRACTION, BILATERAL    ? ? ?Allergies  ?Allergen Reactions  ? Doxycycline Other (See Comments)  ?  Unknown  ? Sulfonamide Derivatives Other (See Comments)  ?  Unknown   ? ? ?Outpatient Encounter Medications as of 05/13/2021  ?Medication Sig  ? acetaminophen (TYLENOL) 500 MG tablet Take 1,000 mg by mouth at bedtime.  ? calcium carbonate (TUMS EX) 750 MG chewable tablet Chew 1 tablet by mouth daily.  ? carbidopa-levodopa (PARCOPA) 25-100 MG disintegrating tablet Take 2 tablets by mouth 5 (five) times daily.  ? carbidopa-levodopa (PARCOPA) 25-100 MG disintegrating tablet Take 1 tablet by mouth See admin instructions. May give one additional tablet daily in addition to scheduled dose as needed for increased symptoms related to Parkinson's. For taking at 11am if needed. Do not take after 1130 or within one hour of next dose.  ? carbidopa-levodopa (SINEMET CR) 50-200 MG tablet Take 1 tablet by mouth at bedtime.  ? cetirizine (ZYRTEC) 10 MG tablet Take 10 mg by mouth at bedtime.  ? Cholecalciferol (VITAMIN D3) 10 MCG (400 UNIT) CAPS Take 1 capsule by mouth daily.  ? Cranberry 450 MG TABS Take 1 tablet by mouth daily.  ? diclofenac Sodium (VOLTAREN) 1 % GEL Apply 2 g topically 3 (three) times daily as needed (Neck pain).  ? entacapone (COMTAN) 200 MG tablet Take 200 mg by mouth 3 (three) times  daily.  ? estradiol (ESTRACE) 0.1 MG/GM vaginal cream Place 1 Applicatorful vaginally 2 (two) times a week. Monday's and Friday's  ? ferrous sulfate 325 (65 FE) MG tablet Take 325 mg by mouth every Monday, Wednesday, and Friday.  ? hydrocortisone (ANUSOL-HC) 2.5 % rectal cream Place 1 application. rectally daily as needed for hemorrhoids.  ? hydrOXYzine (  ATARAX) 25 MG tablet Take 25 mg by mouth 2 (two) times daily as needed for anxiety.  ? lactose free nutrition (BOOST) LIQD Take 237 mLs by mouth daily.  ? methocarbamol (ROBAXIN) 500 MG tablet Take 250 mg by mouth daily as needed for muscle spasms.  ? midodrine (PROAMATINE) 5 MG tablet Take 5 mg by mouth in the morning, at noon, and at bedtime.  ? mirabegron ER (MYRBETRIQ) 50 MG TB24 tablet Take 50 mg by mouth daily.  ? pantoprazole (PROTONIX) 40 MG tablet Take 40 mg by mouth daily.  ? polyethylene glycol (MIRALAX / GLYCOLAX) 17 g packet Take 17 g by mouth 2 (two) times daily.  ? polyvinyl alcohol (LIQUIFILM TEARS) 1.4 % ophthalmic solution Place 1 drop into both eyes 3 (three) times daily as needed for dry eyes.  ? rivaroxaban (XARELTO) 20 MG TABS tablet Take 20 mg by mouth daily with supper.  ? Saline (ARY NASAL MIST ALLERGY/SINUS) 2.65 % SOLN Place 1 spray into both nostrils in the morning and at bedtime.  ? simethicone (MYLICON) 80 MG chewable tablet Chew 80 mg by mouth with breakfast, with lunch, and with evening meal.  ? sodium fluoride (PREVIDENT 5000 PLUS) 1.1 % CREA dental cream Place 1 application. onto teeth in the morning and at bedtime.  ? trimethoprim (TRIMPEX) 100 MG tablet Take 100 mg by mouth at bedtime.  ? zinc oxide 20 % ointment Apply 1 application. topically as needed for irritation. Apply to buttocks after every incontinent episode and for redness  ? ?No facility-administered encounter medications on file as of 05/13/2021.  ? ? ?Review of Systems  ?Constitutional:  Negative for appetite change, fatigue and fever.  ?HENT:  Positive for hearing  loss. Negative for congestion, rhinorrhea and trouble swallowing.   ?Eyes:  Positive for visual disturbance.  ?     C/o double vision  ?Respiratory:  Positive for shortness of breath. Negative for cough

## 2021-05-13 NOTE — Assessment & Plan Note (Signed)
Reported the patient's c/o she can not breathe, having SOB, it feels like her food is just sitting at the top of her stomach. No O2 desaturation. The patient denied cough, chest pain/pressure,  palpitation, nausea, vomiting, constipation, abd pain, or trouble swallowing. The patient declined ED eval, wants to lie down to relax.  ? Will monitor VS +SatO2 q shift x24 hours, may CXR, EKG, labs  if symptoms persist.  ? ?

## 2021-05-13 NOTE — Assessment & Plan Note (Signed)
12/25/20 ABI R+L 0.7, moderate arterial disease, vascular specialist if desires.  

## 2021-05-13 NOTE — Assessment & Plan Note (Signed)
stable, Hgb 11.8 11/22/20,  takes Fe 

## 2021-05-13 NOTE — Assessment & Plan Note (Signed)
Normalized Bp,  takes Midodrine.  ?

## 2021-05-13 NOTE — Assessment & Plan Note (Signed)
chronic edema BLE. Off Metolazone 09/27/19. Echo EF normal in the past.  ?

## 2021-05-14 DIAGNOSIS — L602 Onychogryphosis: Secondary | ICD-10-CM | POA: Diagnosis not present

## 2021-05-14 DIAGNOSIS — L84 Corns and callosities: Secondary | ICD-10-CM | POA: Diagnosis not present

## 2021-05-29 DIAGNOSIS — N3020 Other chronic cystitis without hematuria: Secondary | ICD-10-CM | POA: Diagnosis not present

## 2021-05-29 DIAGNOSIS — N3946 Mixed incontinence: Secondary | ICD-10-CM | POA: Diagnosis not present

## 2021-06-10 ENCOUNTER — Non-Acute Institutional Stay (SKILLED_NURSING_FACILITY): Payer: Medicare PPO | Admitting: Adult Health

## 2021-06-10 DIAGNOSIS — K219 Gastro-esophageal reflux disease without esophagitis: Secondary | ICD-10-CM

## 2021-06-10 DIAGNOSIS — K5901 Slow transit constipation: Secondary | ICD-10-CM | POA: Diagnosis not present

## 2021-06-10 DIAGNOSIS — R1084 Generalized abdominal pain: Secondary | ICD-10-CM | POA: Diagnosis not present

## 2021-06-10 DIAGNOSIS — K9289 Other specified diseases of the digestive system: Secondary | ICD-10-CM

## 2021-06-11 ENCOUNTER — Encounter: Payer: Self-pay | Admitting: Adult Health

## 2021-06-11 DIAGNOSIS — L821 Other seborrheic keratosis: Secondary | ICD-10-CM | POA: Diagnosis not present

## 2021-06-11 DIAGNOSIS — L814 Other melanin hyperpigmentation: Secondary | ICD-10-CM | POA: Diagnosis not present

## 2021-06-11 DIAGNOSIS — D1801 Hemangioma of skin and subcutaneous tissue: Secondary | ICD-10-CM | POA: Diagnosis not present

## 2021-06-11 DIAGNOSIS — D692 Other nonthrombocytopenic purpura: Secondary | ICD-10-CM | POA: Diagnosis not present

## 2021-06-11 NOTE — Progress Notes (Signed)
Location:  [facility] Room Number: [place] of Service:  SNF (31) Provider:  Durenda Age, DNP, FNP-BC  Patient Care Team: [name], MD as PCP - General (Internal Medicine) [name], [name], MD as PCP - Cardiology (Cardiology) [name], [name], DO as Consulting Physician (Neurology) Mast, Man X, NP as Nurse Practitioner (Internal Medicine)  Extended Emergency Contact Information Primary Emergency Contact: Chequita, Mofield Mobile Phone: [phone] Relation: Daughter Interpreter needed? No Secondary Emergency Contact: [facility] Address: [address]          [place], [place] [address] [name] of [place] Phone: [phone] Mobile Phone: [phone] Relation: Son  Code Status:  DNR  Goals of care: Advanced Directive information    06/13/2021   10:38 AM  Advanced Directives  Does Patient Have a Medical Advance Directive? Yes  Type of Advance Directive [place]  Does patient want to make changes to medical advance directive? No - Patient declined  Copy of [place] in Chart? Yes - validated most recent copy scanned in chart (See row information)     Chief Complaint  Patient presents with   Acute Visit    Patient is being seen for abdominal pain    HPI:  Pt is a 81 y.o. female seen today for an acute visit regarding mid abdominal pain. She denies nausea and vomiting. She stated that she has bowel movements daily but feels constipated. She currently takes Miralax 17 gm PO BID for constipation, TUMS 750 mg daily and Simethicone 80 mg TID for gas pains and Pantoprazole 40 mg daily for GERD. No tenderness on abdomen noted. She is a long-term care resident of [facility].   Past Medical History:  Diagnosis Date   Acute lower UTI 09/14/2018   Anemia 10/15/2018   2016 colonoscopy 10/14/18 wbc 5.4, Hgb 10.1, plt 184 12/01/18 wbc 4.5, Hgb 11.1, plt 188, neutrophils 63,  Na 139, K 3.7, Bun 23,  creat 0.69, eGFR 83 on Fe, Hgb 11.7 12/09/18    Atrial fibrillation ([place]) 05/21/2010   10/07/18 Na 143, K 4.0, Bun 20, creat 0.79, eGFR 72, wbc 7.1, Hgb 10.9, plt 172 10/28/18 Na 142, K 4.0, Bun 19, creat 0.77, eGFR 74 11/18/18 Na 144, K 4.1, Bun 21, creat 0.69, eGFR 83   Bilateral lower extremity edema 07/19/2018   11/02/18 BMP 2 weeks.    Cervical pain (neck) 06/20/2010   Closed fracture of part of upper end of humerus 05/01/2015   Colles' fracture of right radius 03/05/2015   Constipation 07/19/2018   DEGENERATIVE JOINT DISEASE, RIGHT HIP 02/16/2007   Dupuytren's contracture    Dysuria 09/20/2018   09/20/18 c/o got up several times last night to go urinate, burning on urination, lower abd /back discomfort, but urinary frequency, leakage are not new. UA C/S, Pyridium 113m tid x 2 days.  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   S/P breast biopsy, left    two o'clock position - benign  Toxic effect of venom(989.5) 07/27/2007   Tremor, unspecified 10/19/2015   Unstable gait 02/16/2017   Vaginal atrophy 10/19/2015   Venous insufficiency    Weakness 09/12/2018   Past Surgical History:  Procedure Laterality Date   BREAST BIOPSY Left    CATARACT EXTRACTION, BILATERAL      Allergies  Allergen Reactions   Doxycycline Other (See Comments)    Unknown   Sulfonamide Derivatives Other (See Comments)    Unknown     Outpatient Encounter Medications as of 06/10/2021  Medication Sig   acetaminophen (TYLENOL) 500 MG tablet Take 1,000 mg by mouth at bedtime.    calcium carbonate (TUMS EX) 750 MG chewable tablet Chew 1 tablet by mouth daily.   carbidopa-levodopa (PARCOPA) 25-100 MG disintegrating tablet Take 2 tablets by mouth 5 (five) times daily.   carbidopa-levodopa (PARCOPA) 25-100 MG disintegrating tablet Take 1 tablet by mouth See admin instructions. May give one additional tablet daily in addition to scheduled dose as needed for increased symptoms related to Parkinson's. For taking at 11am if needed. Do not take after 1130 or within one hour of next dose.   carbidopa-levodopa (SINEMET CR) 50-200 MG tablet Take 1 tablet by mouth at bedtime.   cetirizine (ZYRTEC) 10 MG tablet Take 10 mg by mouth at bedtime.   Cholecalciferol (VITAMIN D3) 10 MCG (400 UNIT) CAPS Take 1 capsule by mouth daily.   Cranberry 450 MG TABS Take 1 tablet by mouth daily.   diclofenac Sodium (VOLTAREN) 1 % GEL Apply 2 g topically 3 (three) times daily as needed (Neck pain).   entacapone (COMTAN) 200 MG tablet Take 200 mg by mouth 3 (three) times daily.   estradiol (ESTRACE) 0.1 MG/GM vaginal cream Place 1 Applicatorful vaginally 2 (two) times a week. Monday's and Friday's   ferrous sulfate 325 (65 FE) MG tablet Take 325 mg by mouth every Monday, Wednesday, and Friday.   hydrocortisone (ANUSOL-HC) 2.5 % rectal cream Place 1 application. rectally daily as needed for hemorrhoids.   hydrOXYzine (ATARAX) 25 MG tablet Take 25 mg by mouth 2 (two) times daily as needed for anxiety.   lactose free nutrition (BOOST) LIQD Take 237 mLs by mouth daily.   methocarbamol (ROBAXIN) 500 MG tablet Take 250 mg by mouth daily as needed for muscle spasms.   midodrine (PROAMATINE) 5 MG tablet Take 5 mg by mouth in the morning, at noon, and at bedtime.   mirabegron ER (MYRBETRIQ) 50 MG TB24 tablet Take 50 mg by mouth daily.   pantoprazole (PROTONIX) 40 MG tablet Take 40 mg by mouth daily.   polyethylene glycol (MIRALAX / GLYCOLAX) 17 g packet Take 17 g by mouth 2 (two) times daily.   polyvinyl alcohol  (LIQUIFILM TEARS) 1.4 % ophthalmic solution Place 1 drop into both eyes 3 (three) times daily as needed for dry eyes.   rivaroxaban (XARELTO) 20 MG TABS tablet Take 20 mg by mouth daily with supper.   Saline (ARY NASAL MIST ALLERGY/SINUS) 2.65 % SOLN Place 1 spray into both nostrils in the morning and at bedtime.   simethicone (MYLICON) 80 MG chewable tablet Chew 80 mg by mouth with breakfast, with lunch, and with evening meal.   sodium fluoride (PREVIDENT 5000 PLUS) 1.1 % CREA dental cream Place 1 application. onto teeth in the morning and at bedtime.   trimethoprim (TRIMPEX) 100 MG tablet Take 100 mg by mouth at bedtime.   zinc oxide 20 % ointment Apply 1 application. topically as needed for irritation. Apply to buttocks  after every incontinent episode and for redness   No facility-administered encounter medications on file as of 06/10/2021.    Review of Systems  Constitutional:  Negative for appetite change, chills, fatigue and fever.  HENT:  Negative for congestion, hearing loss, rhinorrhea and sore throat.   Eyes: Negative.   Respiratory:  Negative for cough, shortness of breath and wheezing.   Cardiovascular:  Negative for chest pain, palpitations and leg swelling.  Gastrointestinal:  Positive for abdominal pain and constipation. Negative for diarrhea, nausea and vomiting.  Genitourinary:  Negative for dysuria.  Musculoskeletal:  Negative for arthralgias, back pain and myalgias.  Skin:  Negative for color change, rash and wound.  Neurological:  Negative for dizziness, weakness and headaches.  Psychiatric/Behavioral:  Negative for behavioral problems. The patient is not nervous/anxious.       Immunization History  Administered Date(s) Administered   Influenza Split 10/20/2012   Influenza Whole 10/21/2006   Influenza, High Dose Seasonal PF 10/17/2016, 11/02/2019   Influenza-Unspecified 10/20/2013, 10/22/2017, 11/07/2020   Moderna Sars-Covid-2 Vaccination 01/22/2019, 02/19/2019,  11/29/2019, 06/19/2020   Pfizer Covid-19 Vaccine Bivalent Booster 83yr & up 10/10/2020   Pneumococcal Conjugate-13 01/31/2013   Pneumococcal Polysaccharide-23 01/20/2005, 11/24/2011   Td 01/21/2004   Tdap 03/16/2014   Zoster, Live 04/17/2009   Pertinent  Health Maintenance Due  Topic Date Due   INFLUENZA VACCINE  08/20/2021   DEXA SCAN  Completed   COLONOSCOPY (Pts [id number] coverage will need to be confirmed)  Discontinued      09/12/2018    4:00 AM 09/12/2018    8:12 AM 09/13/2018    8:30 PM 09/14/2018    8:38 AM 04/18/2021    8:05 AM  Fall Risk  Patient Fall Risk Level High fall risk High fall risk High fall risk High fall risk Moderate fall risk     Vitals:   06/11/21 0805  BP: 120/60  Pulse: 70  Resp: 18  Temp: (!) 97.1 F (36.2 C)  SpO2: 92%  Weight: 131 lb 4.8 oz (59.6 kg)  Height: _0  (1.727 m)   Body mass index is 19.96 kg/m.  Physical Exam Constitutional:      Appearance: Normal appearance.  HENT:     Head: Normocephalic and atraumatic.     Nose: Nose normal.     Mouth/Throat:     Mouth: Mucous membranes are moist.  Eyes:     Conjunctiva/sclera: Conjunctivae normal.  Cardiovascular:     Rate and Rhythm: Normal rate and regular rhythm.  Pulmonary:     Effort: Pulmonary effort is normal.     Breath sounds: Normal breath sounds.  Abdominal:     General: Bowel sounds are normal.     Palpations: Abdomen is soft.  Musculoskeletal:        General: Normal range of motion.     Cervical back: Normal range of motion.  Skin:    General: Skin is warm and dry.  Neurological:     General: No focal deficit present.     Mental Status: She is alert and oriented to person, place, and time.  Psychiatric:        Mood and Affect: Mood normal.        Behavior: Behavior normal.       Labs reviewed: Recent Labs    10/12/20 0000  NA 141  K 4.2  CL 109*  CO2 27*  BUN 20  CREATININE 0.7  CALCIUM 9.0   Recent Labs    10/12/20 0000  AST 11*   ALT 5*  ALKPHOS 53  ALBUMIN 3.6   Recent Labs    10/12/20 0000 11/22/20 0000  WBC 4.1 6.0  NEUTROABS 2,817.00 4,620.00  HGB 11.0* 11.8*  HCT 35* 36  PLT 160 181   Lab Results  Component Value Date   TSH 1.30 03/29/2020   No results found for: HGBA1C Lab Results  Component Value Date   CHOL 185 03/24/2019   HDL 79 (A) 03/24/2019   LDLCALC 94 03/24/2019   LDLDIRECT 104.2 01/25/2013   TRIG 41 03/24/2019   CHOLHDL 2 08/29/2015    Significant Diagnostic Results in last 30 days:  No results found.  Assessment/Plan  1. Generalized abdominal pain -  will get KUB done today -  bowel sounds are normo-active  2. Gastroesophageal reflux disease, unspecified whether esophagitis present -  continue Pantoprazole  3. Slow transit constipation -  continue Miralax  4. Gas bloat syndrome -  continue TUMS and Simethicone    Family/ staff Communication: Discussed plan of care with resident and charge nurse.  Labs/tests ordered:   KUB     Durenda Age, DNP, MSN, FNP-BC [place] [phone] (Monday-Friday 8:00 a.m. - 5:00 p.m.) [phone] (after hours)

## 2021-06-13 ENCOUNTER — Non-Acute Institutional Stay (SKILLED_NURSING_FACILITY): Payer: Medicare PPO | Admitting: Orthopedic Surgery

## 2021-06-13 ENCOUNTER — Encounter: Payer: Self-pay | Admitting: Orthopedic Surgery

## 2021-06-13 DIAGNOSIS — R109 Unspecified abdominal pain: Secondary | ICD-10-CM

## 2021-06-13 DIAGNOSIS — D500 Iron deficiency anemia secondary to blood loss (chronic): Secondary | ICD-10-CM

## 2021-06-13 DIAGNOSIS — K5901 Slow transit constipation: Secondary | ICD-10-CM | POA: Diagnosis not present

## 2021-06-13 DIAGNOSIS — N390 Urinary tract infection, site not specified: Secondary | ICD-10-CM

## 2021-06-13 DIAGNOSIS — F419 Anxiety disorder, unspecified: Secondary | ICD-10-CM

## 2021-06-13 DIAGNOSIS — G903 Multi-system degeneration of the autonomic nervous system: Secondary | ICD-10-CM | POA: Diagnosis not present

## 2021-06-13 DIAGNOSIS — K219 Gastro-esophageal reflux disease without esophagitis: Secondary | ICD-10-CM | POA: Diagnosis not present

## 2021-06-13 DIAGNOSIS — G20 Parkinson's disease: Secondary | ICD-10-CM | POA: Diagnosis not present

## 2021-06-13 DIAGNOSIS — M159 Polyosteoarthritis, unspecified: Secondary | ICD-10-CM | POA: Diagnosis not present

## 2021-06-13 DIAGNOSIS — I480 Paroxysmal atrial fibrillation: Secondary | ICD-10-CM | POA: Diagnosis not present

## 2021-06-13 NOTE — Progress Notes (Addendum)
Location:  [facility] Room Number: NO13/A Place of Service:  SNF [phone]) Provider:  Yvonna Alanis, NP   [name], MD  Patient Care Team: [name], MD as PCP - General (Internal Medicine) [name], [name], MD as PCP - Cardiology (Cardiology) [name], [name], DO as Consulting Physician (Neurology) Mast, Man X, NP as Nurse Practitioner (Internal Medicine)  Extended Emergency Contact Information Primary Emergency Contact: Tani, Virgo Mobile Phone: [phone] Relation: Daughter Interpreter needed? No Secondary Emergency Contact: [facility] Address: [address]          [place], [place] [address] [name] of [place] Phone: [phone] Mobile Phone: [phone] Relation: Son  Code Status:  DNR Goals of care: Advanced Directive information    06/13/2021   10:38 AM  Advanced Directives  Does Patient Have a Medical Advance Directive? Yes  Type of Advance Directive [place]  Does patient want to make changes to medical advance directive? No - Patient declined  Copy of [place] in Chart? Yes - validated most recent copy scanned in chart (See row information)     Chief Complaint  Patient presents with   Medical Management of Chronic Issues    Patient is here for a follow up for chronic conditions, discuss getting Shingrix vaccine     HPI:  Pt is a 81 y.o. female seen today for medical management of chronic diseases.    Abdominal discomfort- 05/22 reported increased abdominal pain, KUB ordered- not completed, reports improved pain today, remains on simethicone tablets TID Afib- rate controlled without medication, remains on xarelto for clot prevention, hgb 11.8 11/2020, TSH 1.30 03/2020 Parkinson's- followed by [facility] neurology, improved symptoms with sinemet, Comtan and Neupro patch Hypotension- SBP> 100, remains on midodrine, see pressures below GERD- hgb 11.8 11/2020, remains on  Protonix Anemia- hgb 11.8 11/2020, remains on ferrous sulfate  Constipation- LBM 05/24, remains on miralax Recurrent UTI- followed by urology, intermittent suprapubic pressure, remains on Trimpex and estrace 2x/week OA multiple sites-  remains on tylenol, methocarbamol and voltaren gel Anxiety- no recent panic attacks, remains on hydroxyzine prn  No recent falls or injuries, ambulates with wheelchair. Able to do some ADLs on her own.   Recent blood pressures:  05/23- 100/68  05/16- 120/60  05/09- 97/55  Recent weights:  05/01- 129.8 lbs  4/01- 126.2 lbs  03/01- 128.3 lbs  Past Medical History:  Diagnosis Date   Acute lower UTI 09/14/2018   Anemia 10/15/2018   2016 colonoscopy 10/14/18 wbc 5.4, Hgb 10.1, plt 184 12/01/18 wbc 4.5, Hgb 11.1, plt 188, neutrophils 63,  Na 139, K 3.7, Bun 23, creat 0.69, eGFR 83 on Fe, Hgb 11.7 12/09/18    Atrial fibrillation ([place]) 05/21/2010   10/07/18 Na 143, K 4.0, Bun 20, creat 0.79, eGFR 72, wbc 7.1, Hgb 10.9, plt 172 10/28/18 Na 142, K 4.0, Bun 19, creat 0.77, eGFR 74 11/18/18 Na 144, K 4.1, Bun 21, creat 0.69, eGFR 83   Bilateral lower extremity edema 07/19/2018   11/02/18 BMP 2 weeks.    Cervical pain (neck) 06/20/2010   Closed fracture of part of upper end of humerus 05/01/2015   Colles' fracture of right radius 03/05/2015   Constipation 07/19/2018   DEGENERATIVE JOINT DISEASE, RIGHT HIP 02/16/2007   Dupuytren's contracture    Dysuria 09/20/2018   09/20/18 c/o got up several times last night to go urinate, burning on urination, lower abd /back discomfort, but urinary frequency,  leakage are not new. UA C/S, Pyridium 143m tid x 2 days.  09/21/18 wbc 6.1, Hgb 11.8, plt 210, neutrophil 69.4, Na 142, K 4.1, Bun 19, creat 0.78, TP 6.4, albumin 3.9   Hematuria 02/04/2014   Hypotension 09/18/2018   Long term (current) use of anticoagulants 05/29/2016   Osteoarthritis of hip    right   Osteoarthritis of left knee    Overactive bladder 10/06/2018   Parkinson disease  ([place]    Par[place]mal A-fib ([facility]    POSTMENOPAUSAL SYNDROME 02/16/2007   PREMATURE ATRIAL CONTRACTIONS 02/16/2007   Primary osteoarthritis of right shoulder 04/27/2017   S/P breast biopsy, left    two o'clock position - benign   Toxic effect of venom(989.5) 07/27/2007   Tremor, unspecified 10/19/2015   Unstable gait 02/16/2017   Vaginal atrophy 10/19/2015   Venous insufficiency    Weakness 09/12/2018   Past Surgical History:  Procedure Laterality Date   BREAST BIOPSY Left    CATARACT EXTRACTION, BILATERAL      Allergies  Allergen Reactions   Doxycycline Other (See Comments)    Unknown   Sulfonamide Derivatives Other (See Comments)    Unknown     Outpatient Encounter Medications as of 06/13/2021  Medication Sig   acetaminophen (TYLENOL) 500 MG tablet Take 1,000 mg by mouth at bedtime.   calcium carbonate (TUMS EX) 750 MG chewable tablet Chew 1 tablet by mouth daily.   carbidopa-levodopa (PARCOPA) 25-100 MG disintegrating tablet Take 2 tablets by mouth 5 (five) times daily.   carbidopa-levodopa (PARCOPA) 25-100 MG disintegrating tablet Take 1 tablet by mouth See admin instructions. May give one additional tablet daily in addition to scheduled dose as needed for increased symptoms related to Parkinson's. For taking at 11am if needed. Do not take after 1130 or within one hour of next dose.   carbidopa-levodopa (SINEMET CR) 50-200 MG tablet Take 1 tablet by mouth at bedtime.   cetirizine (ZYRTEC) 10 MG tablet Take 10 mg by mouth at bedtime.   Cholecalciferol (VITAMIN D3) 10 MCG (400 UNIT) CAPS Take 1 capsule by mouth daily.   Cranberry 450 MG TABS Take 1 tablet by mouth daily.   diclofenac Sodium (VOLTAREN) 1 % GEL Apply 2 g topically 3 (three) times daily as needed (Neck pain).   entacapone (COMTAN) 200 MG tablet Take 200 mg by mouth 3 (three) times daily.   estradiol (ESTRACE) 0.1 MG/GM vaginal cream Place 1 Applicatorful vaginally 2 (two) times a week. Monday's and Friday's   ferrous  sulfate 325 (65 FE) MG tablet Take 325 mg by mouth every Monday, Wednesday, and Friday.   hydrocortisone (ANUSOL-HC) 2.5 % rectal cream Place 1 application. rectally daily as needed for hemorrhoids.   hydrOXYzine (ATARAX) 25 MG tablet Take 25 mg by mouth 2 (two) times daily as needed for anxiety.   lactose free nutrition (BOOST) LIQD Take 237 mLs by mouth daily.   methocarbamol (ROBAXIN) 500 MG tablet Take 250 mg by mouth daily as needed for muscle spasms.   midodrine (PROAMATINE) 5 MG tablet Take 5 mg by mouth in the morning, at noon, and at bedtime.   mirabegron ER (MYRBETRIQ) 50 MG TB24 tablet Take 50 mg by mouth daily.   pantoprazole (PROTONIX) 40 MG tablet Take 40 mg by mouth daily.   polyethylene glycol (MIRALAX / GLYCOLAX) 17 g packet Take 17 g by mouth 2 (two) times daily.   polyvinyl alcohol (LIQUIFILM TEARS) 1.4 % ophthalmic solution Place 1 drop into both eyes 3 (three) times daily  as needed for dry eyes.   rivaroxaban (XARELTO) 20 MG TABS tablet Take 20 mg by mouth daily with supper.   Saline (ARY NASAL MIST ALLERGY/SINUS) 2.65 % SOLN Place 1 spray into both nostrils in the morning and at bedtime.   simethicone (MYLICON) 80 MG chewable tablet Chew 80 mg by mouth with breakfast, with lunch, and with evening meal.   sodium fluoride (PREVIDENT 5000 PLUS) 1.1 % CREA dental cream Place 1 application. onto teeth in the morning and at bedtime.   trimethoprim (TRIMPEX) 100 MG tablet Take 100 mg by mouth at bedtime.   zinc oxide 20 % ointment Apply 1 application. topically as needed for irritation. Apply to buttocks after every incontinent episode and for redness   No facility-administered encounter medications on file as of 06/13/2021.    Review of Systems  Constitutional:  Negative for activity change, appetite change, fatigue and fever.  HENT:  Negative for congestion and trouble swallowing.   Eyes:  Negative for visual disturbance.  Respiratory:  Negative for cough, shortness of breath  and wheezing.   Cardiovascular:  Positive for leg swelling. Negative for chest pain.  Gastrointestinal:  Positive for abdominal pain and constipation. Negative for abdominal distention, blood in stool, diarrhea, nausea, rectal pain and vomiting.  Genitourinary:  Positive for frequency. Negative for dysuria and hematuria.  Musculoskeletal:  Positive for arthralgias, back pain, gait problem and myalgias.  Skin:  Negative for wound.  Neurological:  Positive for tremors and weakness. Negative for dizziness and headaches.  Psychiatric/Behavioral:  Negative for confusion, dysphoric mood and sleep disturbance. The patient is nervous/anxious.    Immunization History  Administered Date(s) Administered   Influenza Split 10/20/2012   Influenza Whole 10/21/2006   Influenza, High Dose Seasonal PF 10/17/2016, 11/02/2019   Influenza-Unspecified 10/20/2013, 10/22/2017, 11/07/2020   Moderna Sars-Covid-2 Vaccination 01/22/2019, 02/19/2019, 11/29/2019, 06/19/2020   Pfizer Covid-19 Vaccine Bivalent Booster 24yr & up 10/10/2020   Pneumococcal Conjugate-13 01/31/2013   Pneumococcal Polysaccharide-23 01/20/2005, 11/24/2011   Td 01/21/2004   Tdap 03/16/2014   Zoster, Live 04/17/2009   Pertinent  Health Maintenance Due  Topic Date Due   INFLUENZA VACCINE  08/20/2021   DEXA SCAN  Completed   COLONOSCOPY (Pts [id number] coverage will need to be confirmed)  Discontinued      09/12/2018    4:00 AM 09/12/2018    8:12 AM 09/13/2018    8:30 PM 09/14/2018    8:38 AM 04/18/2021    8:05 AM  Fall Risk  Patient Fall Risk Level High fall risk High fall risk High fall risk High fall risk Moderate fall risk   Functional Status Survey:    Vitals:   06/13/21 1035  BP: 100/68  Pulse: 70  Resp: 18  Temp: 98.1 F (36.7 C)  SpO2: 98%  Weight: 130 lb (59 kg)  Height: '5\' 8"'  (1.727 m)   Body mass index is 19.77 kg/m. Physical Exam Vitals reviewed.  Constitutional:      General: She is not in acute  distress. HENT:     Head: Normocephalic.     Right Ear: There is no impacted cerumen.     Left Ear: There is no impacted cerumen.     Nose: Nose normal.     Mouth/Throat:     Mouth: Mucous membranes are moist.  Eyes:     General:        Right eye: No discharge.        Left eye: No discharge.  Neck:  Comments: Forward neck protrusion Cardiovascular:     Rate and Rhythm: Normal rate. Rhythm irregular.     Pulses: Normal pulses.     Heart sounds: Normal heart sounds.  Pulmonary:     Effort: Pulmonary effort is normal. No respiratory distress.     Breath sounds: Normal breath sounds. No wheezing.  Abdominal:     General: Bowel sounds are normal. There is no distension.     Palpations: Abdomen is soft. There is no mass.     Tenderness: There is no abdominal tenderness. There is no guarding or rebound.     Hernia: No hernia is present.  Musculoskeletal:     Cervical back: Neck supple.     Right lower leg: Edema present.     Left lower leg: Edema present.     Comments: Non pitting, teds hose on  Skin:    General: Skin is warm and dry.     Capillary Refill: Capillary refill takes less than 2 seconds.     Coloration: Skin is not jaundiced.     Findings: No bruising.  Neurological:     General: No focal deficit present.     Mental Status: She is alert and oriented to person, place, and time. Mental status is at baseline.     Motor: No weakness.     Gait: Gait normal.     Comments: Wheelchair, resting hand tremor, rigidity   Psychiatric:        Mood and Affect: Mood normal.        Behavior: Behavior normal.    Labs reviewed: Recent Labs    10/12/20 0000  NA 141  K 4.2  CL 109*  CO2 27*  BUN 20  CREATININE 0.7  CALCIUM 9.0   Recent Labs    10/12/20 0000  AST 11*  ALT 5*  ALKPHOS 53  ALBUMIN 3.6   Recent Labs    10/12/20 0000 11/22/20 0000  WBC 4.1 6.0  NEUTROABS 2,817.00 4,620.00  HGB 11.0* 11.8*  HCT 35* 36  PLT 160 181   Lab Results  Component  Value Date   TSH 1.30 03/29/2020   No results found for: HGBA1C Lab Results  Component Value Date   CHOL 185 03/24/2019   HDL 79 (A) 03/24/2019   LDLCALC 94 03/24/2019   LDLDIRECT 104.2 01/25/2013   TRIG 41 03/24/2019   CHOLHDL 2 08/29/2015    Significant Diagnostic Results in last 30 days:  No results found.  Assessment/Plan 1. Abdominal discomfort - 05/22 KUB ordered- will cancel due to improved symptoms - denies pain today - exam unremarkable - LBM 05/24 - cont simethicone - cmp  2. Paroxysmal atrial fibrillation (HCC) - HR controlled without medication - cont xarelto - TSH  3. Primary parkinsonism ([place]) - followed by neurology - doing well in SNF - ambulates in wheelchair, can do some ADLs on own - cont sinemet, comtan and Neupro patch  4. Neurogenic orthostatic hypotension (HCC) - blood pressures stable, SBP > 100 - cont midodrine  5. Gastroesophageal reflux disease, unspecified whether esophagitis present - hgb stable - cont Protonix - cbc/diff  6. Iron deficiency anemia due to chronic blood loss - hgb stable - cont ferrous sulfate  7. Slow transit constipation - LBM 05/24, abdomen soft - cont miralax   8. Recurrent UTI - followed by urology - cont Trimpex and estrace 2x/week  9. Primary osteoarthritis involving multiple joints - cont tylenol, robaxin and Voltaren gel  10. Anxiety - no recent  panic attacks - cont hydroxyzine    Family/ staff Communication: plan discussed with patient and nurse  Labs/tests ordered:  cbc/diff, cmp, TSH

## 2021-06-18 DIAGNOSIS — E039 Hypothyroidism, unspecified: Secondary | ICD-10-CM | POA: Diagnosis not present

## 2021-06-18 DIAGNOSIS — D649 Anemia, unspecified: Secondary | ICD-10-CM | POA: Diagnosis not present

## 2021-06-18 DIAGNOSIS — I10 Essential (primary) hypertension: Secondary | ICD-10-CM | POA: Diagnosis not present

## 2021-06-18 LAB — CBC AND DIFFERENTIAL
HCT: 35 — AB (ref 36–46)
Hemoglobin: 11.7 — AB (ref 12.0–16.0)
Neutrophils Absolute: 2803
Platelets: 159 10*3/uL (ref 150–400)
WBC: 4.4

## 2021-06-18 LAB — HEPATIC FUNCTION PANEL
ALT: 6 U/L — AB (ref 7–35)
AST: 13 (ref 13–35)
Alkaline Phosphatase: 48 (ref 25–125)
Bilirubin, Total: 0.7

## 2021-06-18 LAB — BASIC METABOLIC PANEL
BUN: 21 (ref 4–21)
CO2: 27 — AB (ref 13–22)
Chloride: 108 (ref 99–108)
Creatinine: 0.8 (ref 0.5–1.1)
Glucose: 75
Potassium: 4.4 mEq/L (ref 3.5–5.1)
Sodium: 142 (ref 137–147)

## 2021-06-18 LAB — CBC: RBC: 3.61 — AB (ref 3.87–5.11)

## 2021-06-18 LAB — COMPREHENSIVE METABOLIC PANEL
Albumin: 4 (ref 3.5–5.0)
Calcium: 9.3 (ref 8.7–10.7)
Globulin: 2.4
eGFR: 76

## 2021-06-28 ENCOUNTER — Encounter: Payer: Self-pay | Admitting: Gastroenterology

## 2021-07-05 ENCOUNTER — Non-Acute Institutional Stay (SKILLED_NURSING_FACILITY): Payer: Medicare PPO | Admitting: Adult Health

## 2021-07-05 ENCOUNTER — Encounter: Payer: Self-pay | Admitting: Adult Health

## 2021-07-05 DIAGNOSIS — I480 Paroxysmal atrial fibrillation: Secondary | ICD-10-CM | POA: Diagnosis not present

## 2021-07-05 DIAGNOSIS — K219 Gastro-esophageal reflux disease without esophagitis: Secondary | ICD-10-CM | POA: Diagnosis not present

## 2021-07-05 DIAGNOSIS — D500 Iron deficiency anemia secondary to blood loss (chronic): Secondary | ICD-10-CM | POA: Diagnosis not present

## 2021-07-05 DIAGNOSIS — G903 Multi-system degeneration of the autonomic nervous system: Secondary | ICD-10-CM | POA: Diagnosis not present

## 2021-07-05 DIAGNOSIS — G20 Parkinson's disease: Secondary | ICD-10-CM

## 2021-07-05 DIAGNOSIS — N3281 Overactive bladder: Secondary | ICD-10-CM

## 2021-07-05 NOTE — Progress Notes (Signed)
Location:  [facility] Room Number: NO/13/A Place of Service:  SNF (31) Provider:  Kenard Gower, DNP, FNP-BC  Patient Care Team: Mahlon Gammon, MD as PCP - General (Internal Medicine) [name], Deloris Ping, MD as PCP - Cardiology (Cardiology) [name], Octaviano Batty, DO as Consulting Physician (Neurology) Mast, Man X, NP as Nurse Practitioner (Internal Medicine)  Extended Emergency Contact Information Primary Emergency Contact: Liset, [place] Mobile Phone: [phone] Relation: Daughter Interpreter needed? No Secondary Emergency Contact: [facility] Address: [address]          [place], [place] [address] D[place] of [place] Home Phone: [phone] Mobile Phone: [phone] Relation: Son  Code Status:  DNR  Goals of care: Advanced Directive information    07/05/2021    1:55 PM  Advanced Directives  Does Patient Have a Medical Advance Directive? Yes  Type of Advance Directive Healthcare Power of Attorney  Does patient want to make changes to medical advance directive? No - Patient declined  Copy of Healthcare Power of Attorney in Chart? Yes - validated most recent copy scanned in chart (See row information)     Chief Complaint  Patient presents with   Medical Management of Chronic Issues    Routine Visit    HPI:  Pt is a 81 y.o. female seen today for medical management of chronic diseases. She is a long-term care resident of [facility].  Neurogenic orthostatic hypotension (HCC)  -  SBPs 90 to 110, takes Midodrine 5 mg 3 times a day  Overactive bladder -   takes Myrbetriq ER 50 mg daily  Primary parkinsonism (HCC) -    takes carbidopa-levodopa 50-200 mg 1 tablet at bedtime, carbidopa-levodopa 25-100 mg 2 tabs 5 times a day and daily as needed, entacapone 200 mg 3 times daily and Neupro patch 24-hour 1 mg / 24-hour transdermally  daily  Paroxysmal atrial fibrillation (HCC) -   rate controlled, takes Xarelto 20 mg 1 tab daily for anticoagulation  Gastroesophageal reflux disease, unspecified whether esophagitis present -   takes pantoprazole 40 mg daily  Iron deficiency anemia due to chronic blood loss -   takes ferrous sulfate 325 mg 1 tab daily    Past Medical History:  Diagnosis Date   Acute lower UTI 09/14/2018   Anemia 10/15/2018   2016 colonoscopy 10/14/18 wbc 5.4, Hgb 10.1, plt 184 12/01/18 wbc 4.5, Hgb 11.1, plt 188, neutrophils 63,  Na 139, K 3.7, Bun 23, creat 0.69, eGFR 83 on Fe, Hgb 11.7 12/09/18    Atrial fibrillation (HCC) 05/21/2010   10/07/18 Na 143, K 4.0, Bun 20, creat 0.79, eGFR 72, wbc 7.1, Hgb 10.9, plt 172 10/28/18 Na 142, K 4.0, Bun 19, creat 0.77, eGFR 74 11/18/18 Na 144, K 4.1, Bun 21, creat 0.69, eGFR 83   Bilateral lower extremity edema 07/19/2018   11/02/18 BMP 2 weeks.    Cervical pain (neck) 06/20/2010   Closed fracture of part of upper end of humerus 05/01/2015   Colles' fracture of right radius 03/05/2015   Constipation 07/19/2018   DEGENERATIVE JOINT DISEASE, RIGHT HIP 02/16/2007   Dupuytren's contracture    Dysuria 09/20/2018   09/20/18 c/o got up several times last night to go urinate, burning on urination, lower abd /back discomfort, but urinary frequency, leakage are not new. UA C/S, Pyridium 100mg  tid x 2 days.  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   S/P breast biopsy, left    two o'clock position - benign   Toxic effect of venom(989.5)  07/27/2007   Tremor, unspecified 10/19/2015   Unstable gait 02/16/2017   Vaginal atrophy 10/19/2015   Venous insufficiency    Weakness 09/12/2018   Past Surgical History:  Procedure Laterality Date   BREAST BIOPSY Left    CATARACT EXTRACTION, BILATERAL      Allergies  Allergen Reactions   Doxycycline Other (See Comments)    Unknown   Sulfonamide Derivatives Other (See Comments)    Unknown     Outpatient Encounter Medications as of 07/05/2021  Medication Sig   acetaminophen (TYLENOL) 500 MG tablet Take 1,000 mg by mouth at bedtime.   calcium carbonate (TUMS EX) 750 MG chewable tablet Chew 1 tablet by mouth daily.   carbidopa-levodopa (PARCOPA) 25-100 MG disintegrating tablet Take 2 tablets by mouth 5 (five) times daily.   carbidopa-levodopa (PARCOPA) 25-100 MG disintegrating tablet Take 1 tablet by mouth See admin instructions. May give one additional tablet daily in addition to scheduled dose as needed for increased symptoms related to Parkinson's. For taking at 11am if needed. Do not take after 1130 or within one hour of next dose.   carbidopa-levodopa (SINEMET CR) 50-200 MG tablet Take 1 tablet by mouth at bedtime.   cetirizine (ZYRTEC) 10 MG tablet Take 10 mg by mouth at bedtime.   Cholecalciferol (VITAMIN D3) 10 MCG (400 UNIT) CAPS Take 1 capsule by mouth daily.   Cranberry 450 MG TABS Take 1 tablet by mouth daily.   diclofenac Sodium (VOLTAREN) 1 % GEL Apply 2 g topically 3 (three) times daily as needed (Neck pain).   entacapone (COMTAN) 200 MG tablet Take 200 mg by mouth 3 (three) times daily.   estradiol (ESTRACE) 0.1 MG/GM vaginal cream Place 1 Applicatorful vaginally 2 (two) times a week. Monday's and Friday's   ferrous sulfate 325 (65 FE) MG tablet Take 325 mg by mouth every Monday, Wednesday, and Friday.   hydrocortisone (ANUSOL-HC) 2.5 % rectal cream Place 1 application. rectally daily as needed for hemorrhoids.   hydrOXYzine (ATARAX) 25 MG tablet Take 25 mg by mouth 2 (two)  times daily as needed for anxiety.   lactose free nutrition (BOOST) LIQD Take 237 mLs by mouth daily.   methocarbamol (ROBAXIN) 500 MG tablet Take 250 mg by mouth daily as needed for muscle spasms.   midodrine (PROAMATINE) 5 MG tablet Take 5 mg by mouth in the morning, at noon, and at bedtime.   mirabegron ER (MYRBETRIQ) 50 MG TB24 tablet Take 50 mg by mouth daily.   pantoprazole (PROTONIX) 40 MG tablet Take 40 mg by mouth daily.   polyethylene glycol (MIRALAX / GLYCOLAX) 17 g packet Take 17 g by mouth 2 (two) times daily.   polyvinyl alcohol (LIQUIFILM TEARS) 1.4 % ophthalmic solution Place 1 drop into both eyes 3 (three) times daily as needed for dry eyes.   rivaroxaban (XARELTO) 20 MG TABS tablet Take 20 mg by mouth daily with supper.   Saline (ARY NASAL MIST ALLERGY/SINUS) 2.65 % SOLN Place 1 spray into both  nostrils in the morning and at bedtime.   simethicone (MYLICON) 80 MG chewable tablet Chew 80 mg by mouth with breakfast, with lunch, and with evening meal.   sodium fluoride (PREVIDENT 5000 PLUS) 1.1 % CREA dental cream Place 1 application. onto teeth in the morning and at bedtime.   trimethoprim (TRIMPEX) 100 MG tablet Take 100 mg by mouth at bedtime.   zinc oxide 20 % ointment Apply 1 application. topically as needed for irritation. Apply to buttocks after every incontinent episode and for redness   No facility-administered encounter medications on file as of 07/05/2021.    Review of Systems  Constitutional:  Negative for appetite change, chills, fatigue and fever.  HENT:  Negative for congestion, hearing loss, rhinorrhea and sore throat.   Eyes: Negative.   Respiratory:  Negative for cough, shortness of breath and wheezing.   Cardiovascular:  Negative for chest pain, palpitations and leg swelling.  Gastrointestinal:  Negative for abdominal pain, constipation, diarrhea, nausea and vomiting.  Genitourinary:  Negative for dysuria.  Musculoskeletal:  Negative for arthralgias, back  pain and myalgias.  Skin:  Negative for color change, rash and wound.  Neurological:  Negative for dizziness, weakness and headaches.  Psychiatric/Behavioral:  Negative for behavioral problems. The patient is not nervous/anxious.        Immunization History  Administered Date(s) Administered   Influenza Split 10/20/2012   Influenza Whole 10/21/2006   Influenza, High Dose Seasonal PF 10/17/2016, 11/02/2019   Influenza-Unspecified 10/20/2013, 10/22/2017, 11/07/2020   Moderna Sars-Covid-2 Vaccination 01/22/2019, 02/19/2019, 11/29/2019, 06/19/2020   Pfizer Covid-19 Vaccine Bivalent Booster 54yrs & up 10/10/2020   Pneumococcal Conjugate-13 01/31/2013   Pneumococcal Polysaccharide-23 01/20/2005, 11/24/2011   Td 01/21/2004   Tdap 03/16/2014   Zoster, Live 04/17/2009   Pertinent  Health Maintenance Due  Topic Date Due   INFLUENZA VACCINE  08/20/2021   DEXA SCAN  Completed   COLONOSCOPY (Pts 45-23yrs Insurance coverage will need to be confirmed)  Discontinued      09/12/2018    4:00 AM 09/12/2018    8:12 AM 09/13/2018    8:30 PM 09/14/2018    8:38 AM 04/18/2021    8:05 AM  Fall Risk  Patient Fall Risk Level High fall risk High fall risk High fall risk High fall risk Moderate fall risk     Vitals:   07/05/21 1359  BP: 92/60  Pulse: 71  Resp: 17  Temp: (!) 96.8 F (36 C)  SpO2: 96%  Weight: 130 lb 3.2 oz (59.1 kg)  Height: 5\' 8"  (1.727 m)   Body mass index is 19.8 kg/m.  Physical Exam Constitutional:      Appearance: Normal appearance.  HENT:     Head: Normocephalic and atraumatic.     Nose: Nose normal.     Mouth/Throat:     Mouth: Mucous membranes are moist.  Eyes:     Conjunctiva/sclera: Conjunctivae normal.  Cardiovascular:     Rate and Rhythm: Normal rate and regular rhythm.  Pulmonary:     Effort: Pulmonary effort is normal.     Breath sounds: Normal breath sounds.  Abdominal:     General: Bowel sounds are normal.     Palpations: Abdomen is soft.   Musculoskeletal:        General: Normal range of motion.     Cervical back: Normal range of motion.  Skin:    General: Skin is warm and dry.  Neurological:     General: No focal deficit present.  Mental Status: She is alert and oriented to person, place, and time.  Psychiatric:        Mood and Affect: Mood normal.        Behavior: Behavior normal.        Thought Content: Thought content normal.        Judgment: Judgment normal.        Labs reviewed: Recent Labs    10/12/20 0000  NA 141  K 4.2  CL 109*  CO2 27*  BUN 20  CREATININE 0.7  CALCIUM 9.0   Recent Labs    10/12/20 0000  AST 11*  ALT 5*  ALKPHOS 53  ALBUMIN 3.6   Recent Labs    10/12/20 0000 11/22/20 0000  WBC 4.1 6.0  NEUTROABS 2,817.00 4,620.00  HGB 11.0* 11.8*  HCT 35* 36  PLT 160 181   Lab Results  Component Value Date   TSH 1.30 03/29/2020   No results found for: "HGBA1C" Lab Results  Component Value Date   CHOL 185 03/24/2019   HDL 79 (A) 03/24/2019   LDLCALC 94 03/24/2019   LDLDIRECT 104.2 01/25/2013   TRIG 41 03/24/2019   CHOLHDL 2 08/29/2015    Significant Diagnostic Results in last 30 days:  No results found.  Assessment/Plan  1. Neurogenic orthostatic hypotension (HCC) -   BPs stable, continue midodrine -   Monitor BPs  2. Overactive bladder -   Stable, continue Myrbetriq  3. Primary parkinsonism (HCC) -   Stable, continue carbidopa-levodopa -    Follows up with neurology  4. Paroxysmal atrial fibrillation (HCC) -   Rate controlled, continue Xarelto  5. Gastroesophageal reflux disease, unspecified whether esophagitis present -   Stable, continue pantoprazole  6. Iron deficiency anemia due to chronic blood loss -   Continue ferrous sulfate    Family/ staff Communication: Discussed plan of care with resident and charge nurse.  Labs/tests ordered:   None    Kenard Gower, DNP, MSN, FNP-BC [facility] [phone]  (Monday-Friday 8:00 a.m. - 5:00 p.m.) [phone] (after hours)

## 2021-09-05 ENCOUNTER — Non-Acute Institutional Stay (SKILLED_NURSING_FACILITY): Payer: Medicare PPO | Admitting: Nurse Practitioner

## 2021-09-05 ENCOUNTER — Encounter: Payer: Self-pay | Admitting: Nurse Practitioner

## 2021-09-05 DIAGNOSIS — I872 Venous insufficiency (chronic) (peripheral): Secondary | ICD-10-CM

## 2021-09-05 DIAGNOSIS — G20 Parkinson's disease: Secondary | ICD-10-CM

## 2021-09-05 DIAGNOSIS — I739 Peripheral vascular disease, unspecified: Secondary | ICD-10-CM

## 2021-09-05 DIAGNOSIS — F419 Anxiety disorder, unspecified: Secondary | ICD-10-CM | POA: Diagnosis not present

## 2021-09-05 DIAGNOSIS — G459 Transient cerebral ischemic attack, unspecified: Secondary | ICD-10-CM

## 2021-09-05 DIAGNOSIS — Z87440 Personal history of urinary (tract) infections: Secondary | ICD-10-CM

## 2021-09-05 DIAGNOSIS — G903 Multi-system degeneration of the autonomic nervous system: Secondary | ICD-10-CM

## 2021-09-05 DIAGNOSIS — K219 Gastro-esophageal reflux disease without esophagitis: Secondary | ICD-10-CM

## 2021-09-05 DIAGNOSIS — M159 Polyosteoarthritis, unspecified: Secondary | ICD-10-CM

## 2021-09-05 DIAGNOSIS — K5901 Slow transit constipation: Secondary | ICD-10-CM

## 2021-09-05 DIAGNOSIS — N3941 Urge incontinence: Secondary | ICD-10-CM

## 2021-09-05 DIAGNOSIS — D500 Iron deficiency anemia secondary to blood loss (chronic): Secondary | ICD-10-CM

## 2021-09-05 DIAGNOSIS — I480 Paroxysmal atrial fibrillation: Secondary | ICD-10-CM | POA: Diagnosis not present

## 2021-09-05 NOTE — Assessment & Plan Note (Addendum)
Per clinical presentation, c/o lost of mobility in her left hand 09/04/21, stated she was unable to move left hand and was having issue with her left side of her body, denied pain. 09/05/21 up my visit, the patient sitting in w/c, brushing her teeth, no focal weakness noted, denied headache, chest pain, palpitation, or dizziness. On Xarelto already. Therapy to eval/tx.

## 2021-09-05 NOTE — Assessment & Plan Note (Signed)
stable, Hgb 11.8 11/22/20,  takes Fe 

## 2021-09-05 NOTE — Assessment & Plan Note (Signed)
The patient has chronic edema BLE. Off Metolazone 09/27/19. Echo EF normal in the past.  

## 2021-09-05 NOTE — Assessment & Plan Note (Signed)
Stable, takes MiraLax  

## 2021-09-05 NOTE — Assessment & Plan Note (Signed)
takes Protonix, Simethicone.  

## 2021-09-05 NOTE — Assessment & Plan Note (Signed)
PAD 12/25/20 ABI R+L 0.7, moderate arterial disease, vascular specialist if desires.  

## 2021-09-05 NOTE — Assessment & Plan Note (Signed)
on Tylenol, Methocarbamol, w/c for mobility.

## 2021-09-05 NOTE — Assessment & Plan Note (Signed)
takes Midodrine 

## 2021-09-05 NOTE — Assessment & Plan Note (Signed)
heart rate is in control, on Xarelto, off Diltiazem. Hgb 11.8 11/22/20, TSH 1.30 03/29/20 

## 2021-09-05 NOTE — Assessment & Plan Note (Signed)
chronic, situational, had prn Hydroxyzine in the past.

## 2021-09-05 NOTE — Assessment & Plan Note (Signed)
Parkinson's, symptoms improved, on Sinemet, Comtan, started Neupro 05/08/21,  f/u neurology at [facility]. double vision, chronic, saw Ophthalmology

## 2021-09-05 NOTE — Assessment & Plan Note (Signed)
Urge incontinent of urine, takes Mirabegron, needs urinate q3-4 hours to avoid bladder discomfort.  

## 2021-09-05 NOTE — Assessment & Plan Note (Signed)
Recurrent UTI, saw Urology, placed on Estrace x2/wk, TMP 100mg daily 

## 2021-09-05 NOTE — Progress Notes (Signed)
Location:   SNF [facility]   Place of Service:  SNF (31) Provider: [place] Jahmir Salo NP  [name], MD  Patient Care Team: [name], MD as PCP - General (Internal Medicine) [name], [name], MD as PCP - Cardiology (Cardiology) [name], [name], DO as Consulting Physician (Neurology) Happy Begeman X, NP as Nurse Practitioner (Internal Medicine)  Extended Emergency Contact Information Primary Emergency Contact: Zamya, [place] Mobile Phone: [phone] Relation: Daughter Interpreter needed? No Secondary Emergency Contact: [facility] Address: [address]          [place],  [address] [name] Phone: [phone] Mobile Phone: [phone] Relation: Son  Code Status: DNR Goals of care: Advanced Directive information    07/05/2021    1:55 PM  Advanced Directives  Does Patient Have a Medical Advance Directive? Yes  Type of Advance Directive [place]  Does patient want to make changes to medical advance directive? No - Patient declined  Copy of [place] in Chart? Yes - validated most recent copy scanned in chart (See row information)     Chief Complaint  Patient presents with   Acute Visit    Patient is being seen for no left hand mobility    HPI:  Pt is a 81 y.o. female seen today for an acute visit for c/o lost of mobility in her left hand 09/04/21, stated she was unable to move left hand and was having issue with her left side of her body, denied pain. 09/05/21 up my visit, the patient sitting in w/c, brushing her teeth, no focal weakness noted, denied headache, chest pain, palpitation, or dizziness.     Anxiety, chronic, situational, had prn Hydroxyzine in the past.              PAD 12/25/20 ABI R+L 0.7, moderate arterial disease, vascular specialist if desires.              GERD, takes Protonix, Simethicone.              The patient has chronic edema BLE. Off Metolazone 09/27/19. Echo EF normal in the past.               Afib, heart rate is in control, on Xarelto, off Diltiazem. Hgb 11.8 11/22/20, TSH 1.30 03/29/20             OA,  on Tylenol, Methocarbamol Parkinson's, symptoms improved, on Sinemet, Comtan, started Neupro 05/08/21,  f/u neurology at [facility]. double vision, chronic, saw Ophthalmology              Orthostatic hypotension, takes Midodrine.              Anemia, stable, Hgb 11.8 11/22/20,  takes Fe             Urge incontinent of urine, takes Mirabegron, needs urinate q3-4 hours to avoid bladder discomfort.              Recurrent UTI, saw Urology, placed on Estrace x2/wk, TMP 175m daily             Constipation, takes MiraLax  Past Medical History:  Diagnosis Date   Acute lower UTI 09/14/2018   Anemia 10/15/2018   2016 colonoscopy 10/14/18 wbc 5.4, Hgb 10.1, plt 184 12/01/18 wbc 4.5, Hgb 11.1, plt 188, neutrophils 63,  Na 139, K 3.7, Bun 23, creat 0.69, eGFR 83 on Fe, Hgb 11.7 12/09/18    Atrial fibrillation ([place] 05/21/2010  10/07/18 Na 143, K 4.0, Bun 20, creat 0.79, eGFR 72, wbc 7.1, Hgb 10.9, plt 172 10/28/18 Na 142, K 4.0, Bun 19, creat 0.77, eGFR 74 11/18/18 Na 144, K 4.1, Bun 21, creat 0.69, eGFR 83   Bilateral lower extremity edema 07/19/2018   11/02/18 BMP 2 weeks.    Cervical pain (neck) 06/20/2010   Closed fracture of part of upper end of humerus 05/01/2015   Colles' fracture of right radius 03/05/2015   Constipation 07/19/2018   DEGENERATIVE JOINT DISEASE, RIGHT HIP 02/16/2007   Dupuytren's contracture    Dysuria 09/20/2018   09/20/18 c/o got up several times last night to go urinate, burning on urination, lower abd /back discomfort, but urinary frequency, leakage are not new. UA C/S, Pyridium 157m tid x 2 days.  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/2017   S/P breast biopsy, left    two o'clock position - benign   Toxic effect of venom(989.5) 07/27/2007   Tremor, unspecified 10/19/2015   Unstable gait 02/16/2017   Vaginal atrophy 10/19/2015   Venous insufficiency    Weakness 09/12/2018   Past Surgical History:  Procedure Laterality Date   BREAST BIOPSY Left    CATARACT EXTRACTION, BILATERAL      Allergies  Allergen Reactions   Doxycycline Other (See Comments)    Unknown   Sulfonamide Derivatives Other (See Comments)    Unknown     Allergies as of 09/05/2021       Reactions   Doxycycline Other (See Comments)   Unknown   Sulfonamide Derivatives Other (See Comments)   Unknown         Medication List        Accurate as of September 05, 2021 11:59 PM. If you have any questions, ask your nurse or doctor.          acetaminophen 500 MG tablet Commonly known as: TYLENOL Take 1,000 mg by mouth at bedtime.   ARY NASAL MIST ALLERGY/SINUS 2.65 % Soln Generic drug: Saline Place 1 spray into both nostrils in the morning and at bedtime.   calcium carbonate 750 MG chewable tablet Commonly known as: TUMS EX Chew 1 tablet by mouth daily.   carbidopa-levodopa 50-200 MG tablet Commonly known as: SINEMET CR Take 1 tablet by mouth at bedtime.   carbidopa-levodopa 25-100 MG disintegrating tablet Commonly known as: PARCOPA Take 2 tablets by mouth 5 (five) times daily.   carbidopa-levodopa 25-100 MG disintegrating tablet Commonly known as: PARCOPA Take 1 tablet by mouth See admin instructions. May give one additional tablet daily in addition to scheduled dose as needed for increased symptoms related to Parkinson's. For taking at 11am if needed. Do not take after 1130 or within one hour of next dose.   cetirizine 10 MG  tablet Commonly known as: ZYRTEC Take 10 mg by mouth at bedtime.   Cranberry 450 MG Tabs Take 1 tablet by mouth daily.   diclofenac Sodium 1 % Gel Commonly known as: VOLTAREN Apply 2 g topically 3 (three) times  daily as needed (Neck pain).   entacapone 200 MG tablet Commonly known as: COMTAN Take 200 mg by mouth 3 (three) times daily.   estradiol 0.1 MG/GM vaginal cream Commonly known as: ESTRACE Place 1 Applicatorful vaginally 2 (two) times a week. Monday's and Friday's   ferrous sulfate 325 (65 FE) MG tablet Take 325 mg by mouth every Monday, Wednesday, and Friday.   hydrocortisone 2.5 % rectal cream Commonly known as: ANUSOL-HC Place 1 application. rectally daily as needed for hemorrhoids.   hydrOXYzine 25 MG tablet Commonly known as: ATARAX Take 25 mg by mouth 2 (two) times daily as needed for anxiety.   lactose free nutrition Liqd Take 237 mLs by mouth daily.   methocarbamol 500 MG tablet Commonly known as: ROBAXIN Take 250 mg by mouth daily as needed for muscle spasms.   midodrine 5 MG tablet Commonly known as: PROAMATINE Take 5 mg by mouth in the morning, at noon, and at bedtime.   mirabegron ER 50 MG Tb24 tablet Commonly known as: MYRBETRIQ Take 50 mg by mouth daily.   pantoprazole 40 MG tablet Commonly known as: PROTONIX Take 40 mg by mouth daily.   polyethylene glycol 17 g packet Commonly known as: MIRALAX / GLYCOLAX Take 17 g by mouth 2 (two) times daily.   polyvinyl alcohol 1.4 % ophthalmic solution Commonly known as: LIQUIFILM TEARS Place 1 drop into both eyes 3 (three) times daily as needed for dry eyes.   rivaroxaban 20 MG Tabs tablet Commonly known as: XARELTO Take 20 mg by mouth daily with supper.   simethicone 80 MG chewable tablet Commonly known as: MYLICON Chew 80 mg by mouth with breakfast, with lunch, and with evening meal.   sodium fluoride 1.1 % Crea dental cream Commonly known as: PREVIDENT 5000 PLUS Place 1 application. onto  teeth in the morning and at bedtime.   trimethoprim 100 MG tablet Commonly known as: TRIMPEX Take 100 mg by mouth at bedtime.   Vitamin D3 10 MCG (400 UNIT) Caps Take 1 capsule by mouth daily.   zinc oxide 20 % ointment Apply 1 application. topically as needed for irritation. Apply to buttocks after every incontinent episode and for redness        Review of Systems  Constitutional:  Negative for appetite change, fatigue and fever.  HENT:  Positive for hearing loss. Negative for congestion, rhinorrhea and trouble swallowing.   Eyes:  Positive for visual disturbance.       C/o double vision  Respiratory:  Positive for shortness of breath. Negative for cough, chest tightness and wheezing.   Cardiovascular:  Positive for leg swelling.  Gastrointestinal:  Negative for abdominal pain and constipation.       Suprapubic region discomfort/pressure upon first awake in am, resolves after urination.   Genitourinary:  Positive for frequency. Negative for dysuria and urgency.       Bladder discomfort prior to urination after waited for over 4-5 hours.   Musculoskeletal:  Positive for arthralgias, back pain and gait problem. Negative for myalgias.       Lower back discomfort positional. Chronic shoulder pain L>R, full ROM  Skin:  Negative for color change.  Neurological:  Positive for tremors. Negative for speech difficulty, weakness and headaches.       Moves slow, fine tremor in fingers, burning sensation  in the R+ L great toes when touched by sheets, comes/goes  Psychiatric/Behavioral:  Negative for behavioral problems and sleep disturbance. The patient is nervous/anxious.  Feels anxious sometimes.     Immunization History  Administered Date(s) Administered   Influenza Split 10/20/2012   Influenza Whole 10/21/2006   Influenza, High Dose Seasonal PF 10/17/2016, 11/02/2019   Influenza-Unspecified 10/20/2013, 10/22/2017, 11/07/2020   Moderna Sars-Covid-2 Vaccination 01/22/2019,  02/19/2019, 11/29/2019, 06/19/2020   Pfizer Covid-19 Vaccine Bivalent Booster 6yr & up 10/10/2020   Pneumococcal Conjugate-13 01/31/2013   Pneumococcal Polysaccharide-23 01/20/2005, 11/24/2011   Td 01/21/2004   Tdap 03/16/2014   Zoster, Live 04/17/2009   Pertinent  Health Maintenance Due  Topic Date Due   INFLUENZA VACCINE  08/20/2021   DEXA SCAN  Completed   COLONOSCOPY (Pts 45-475yrInsurance coverage will need to be confirmed)  Discontinued      09/12/2018    4:00 AM 09/12/2018    8:12 AM 09/13/2018    8:30 PM 09/14/2018    8:38 AM 04/18/2021    8:05 AM  Fall Risk  Patient Fall Risk Level High fall risk High fall risk High fall risk High fall risk Moderate fall risk   Functional Status Survey:    Vitals:   09/06/21 0845  BP: (!) 96/58  Pulse: 68  Resp: 18  Temp: 97.6 F (36.4 C)  SpO2: 94%  Weight: 129 lb (58.5 kg)  Height: '5\' 8"'  (1.727 m)   Body mass index is 19.61 kg/m. Physical Exam Vitals and nursing note reviewed.  Constitutional:      Appearance: Normal appearance.  HENT:     Head: Normocephalic and atraumatic.     Nose: Nose normal.     Mouth/Throat:     Mouth: Mucous membranes are moist.  Eyes:     Extraocular Movements: Extraocular movements intact.     Conjunctiva/sclera: Conjunctivae normal.     Pupils: Pupils are equal, round, and reactive to light.     Comments: Chronic double vision, see Ophthalmology 09/20/20  Cardiovascular:     Rate and Rhythm: Normal rate. Rhythm irregular.     Heart sounds: No murmur heard.    Comments: DP pulses present L>R Pulmonary:     Effort: Pulmonary effort is normal.     Breath sounds: No wheezing or rales.  Abdominal:     General: Bowel sounds are normal.     Palpations: Abdomen is soft.     Tenderness: There is no abdominal tenderness.  Musculoskeletal:     Cervical back: Normal range of motion and neck supple.     Right lower leg: Edema present.     Left lower leg: Edema present.     Comments: trace  edema LLE, 1+ RLE. Chronic lower back, shoulder R+L pain.   Skin:    General: Skin is warm and dry.     Comments: ABI showed moderate PAD  Neurological:     General: No focal deficit present.     Mental Status: She is alert and oriented to person, place, and time. Mental status is at baseline.     Coordination: Coordination abnormal.     Gait: Gait abnormal.     Comments: Moves slow, tremor in hands. No facial or limb weakness.   Psychiatric:        Mood and Affect: Mood normal.        Behavior: Behavior normal.     Labs reviewed: Recent Labs    10/12/20 0000  NA 141  K 4.2  CL 109*  CO2 27*  BUN 20  CREATININE 0.7  CALCIUM 9.0   Recent Labs    10/12/20 0000  AST 11*  ALT 5*  ALKPHOS 53  ALBUMIN 3.6   Recent Labs    10/12/20 0000 11/22/20 0000  WBC 4.1 6.0  NEUTROABS 2,817.00 4,620.00  HGB 11.0* 11.8*  HCT 35* 36  PLT 160 181   Lab Results  Component Value Date   TSH 1.30 03/29/2020   No results found for: "HGBA1C" Lab Results  Component Value Date   CHOL 185 03/24/2019   HDL 79 (A) 03/24/2019   LDLCALC 94 03/24/2019   LDLDIRECT 104.2 01/25/2013   TRIG 41 03/24/2019   CHOLHDL 2 08/29/2015    Significant Diagnostic Results in last 30 days:  No results found.  Assessment/Plan: TIA (transient ischemic attack) Per clinical presentation, c/o lost of mobility in her left hand 09/04/21, stated she was unable to move left hand and was having issue with her left side of her body, denied pain. 09/05/21 up my visit, the patient sitting in w/c, brushing her teeth, no focal weakness noted, denied headache, chest pain, palpitation, or dizziness. On Xarelto already. Therapy to eval/tx.   Anxiety chronic, situational, had prn Hydroxyzine in the past.   PAD (peripheral artery disease) (HCC) PAD 12/25/20 ABI R+L 0.7, moderate arterial disease, vascular specialist if desires.   GERD (gastroesophageal reflux disease)  takes Protonix, Simethicone.   Edema of left  lower extremity due to peripheral venous insufficiency  The patient has chronic edema BLE. Off Metolazone 09/27/19. Echo EF normal in the past.   Atrial fibrillation (HCC)  heart rate is in control, on Xarelto, off Diltiazem. Hgb 11.8 11/22/20, TSH 1.30 03/29/20  Osteoarthritis involving multiple joints on both sides of body on Tylenol, Methocarbamol, w/c for mobility.   Parkinsonism ([place]) Parkinson's, symptoms improved, on Sinemet, Comtan, started Neupro 05/08/21,  f/u neurology at [facility]. double vision, chronic, saw Ophthalmology   Hypotension takes Midodrine.   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   History of recurrent UTIs  Recurrent UTI, saw Urology, placed on Estrace x2/wk, TMP 17m daily  Slow transit constipation Stable, takes MFreight forwarderCommunication: plan of care reviewed with the patient and charge nurse.   Labs/tests ordered:  none  Time spend 35 minutes.

## 2021-09-06 ENCOUNTER — Encounter: Payer: Self-pay | Admitting: Nurse Practitioner

## 2021-09-10 ENCOUNTER — Encounter: Payer: Self-pay | Admitting: Internal Medicine

## 2021-09-10 ENCOUNTER — Non-Acute Institutional Stay (SKILLED_NURSING_FACILITY): Payer: Medicare PPO | Admitting: Internal Medicine

## 2021-09-10 DIAGNOSIS — D500 Iron deficiency anemia secondary to blood loss (chronic): Secondary | ICD-10-CM | POA: Diagnosis not present

## 2021-09-10 DIAGNOSIS — N3941 Urge incontinence: Secondary | ICD-10-CM

## 2021-09-10 DIAGNOSIS — G20 Parkinson's disease: Secondary | ICD-10-CM | POA: Diagnosis not present

## 2021-09-10 DIAGNOSIS — N390 Urinary tract infection, site not specified: Secondary | ICD-10-CM

## 2021-09-10 DIAGNOSIS — G903 Multi-system degeneration of the autonomic nervous system: Secondary | ICD-10-CM | POA: Diagnosis not present

## 2021-09-10 DIAGNOSIS — I480 Paroxysmal atrial fibrillation: Secondary | ICD-10-CM | POA: Diagnosis not present

## 2021-09-10 NOTE — Progress Notes (Signed)
Location:   [facility] Room Number: [place] of Service:  SNF [phone]) Provider:  Veleta Miners, MD  [name], MD  Patient Care Team: [name], MD as PCP - General (Internal Medicine) [name], [name], MD as PCP - Cardiology (Cardiology) [name], [name], DO as Consulting Physician (Neurology) Mast, Man X, NP as Nurse Practitioner (Internal Medicine)  Extended Emergency Contact Information Primary Emergency Contact: Amana, Bouska Mobile Phone: [phone] Relation: Daughter Interpreter needed? No Secondary Emergency Contact: [facility] Address: [address]          [place], [place] [address] [name] of [place] Phone: [phone] Mobile Phone: [phone] Relation: Son  Code Status:  DNR Goals of care: Advanced Directive information    09/10/2021   12:23 PM  Advanced Directives  Does Patient Have a Medical Advance Directive? Yes  Type of Paramedic of [place];Out of facility DNR (pink MOST or yellow form)  Does patient want to make changes to medical advance directive? No - Patient declined  Copy of [place] in Chart? Yes - validated most recent copy scanned in chart (See row information)     Chief Complaint  Patient presents with   Medical Management of Chronic Issues    Routine follow up   Immunizations    Shingrix vaccine, COVID vaccine, Flu vaccine due    HPI:  Pt is a 81 y.o. female seen today for medical management of chronic diseases.    Lives in SNF  Patient has a history of Parkinson disease diagnosed 2018  Paroxysmal A. fib on Xarelto, hyperlipidemia, H/o Ovarian Cyst, Lower extremity edema,2 D echo with N EF Biatrial enlargement   Urinary Incontinence  Hypotension  Sees Dr Buck Mam in [place]  Patient is having Progressive decline now She says it is hard for her to feed herself It takes forever to eat. But weigh  tis staying stable Not walking anymore walks very short distance sometimes with therapy recreational walk. But not able to do much Not working on Nustep  Also having pain in her back Neck Shoulders. Difficulty sitting straight in her Wheelchair  Patient also had episode few days ago ? Weakness in her Left Hand. Not sure if TIA She was leaning more on Left But no focal weakness anymore Wt Readings from Last 3 Encounters:  09/10/21 129 lb (58.5 kg)  09/06/21 129 lb (58.5 kg)  07/05/21 130 lb 3.2 oz (59.1 kg)     Past Medical History:  Diagnosis Date   Acute lower UTI 09/14/2018   Anemia 10/15/2018   2016 colonoscopy 10/14/18 wbc 5.4, Hgb 10.1, plt 184 12/01/18 wbc 4.5, Hgb 11.1, plt 188, neutrophils 63,  Na 139, K 3.7, Bun 23, creat 0.69, eGFR 83 on Fe, Hgb 11.7 12/09/18    Atrial fibrillation ([place]) 05/21/2010   10/07/18 Na 143, K 4.0, Bun 20, creat 0.79, eGFR 72, wbc 7.1, Hgb 10.9, plt 172 10/28/18 Na 142, K 4.0, Bun 19, creat 0.77, eGFR 74 11/18/18 Na 144, K 4.1, Bun 21, creat 0.69, eGFR 83   Bilateral lower extremity edema 07/19/2018   11/02/18 BMP 2 weeks.    Cervical pain (neck) 06/20/2010   Closed fracture of part of upper end of humerus 05/01/2015   Colles' fracture of right radius 03/05/2015   Constipation 07/19/2018   DEGENERATIVE JOINT DISEASE, RIGHT HIP 02/16/2007   Dupuytren's contracture    Dysuria 09/20/2018   09/20/18  c/o got up several times last night to go urinate, burning on urination, lower abd /back discomfort, but urinary frequency, leakage are not new. UA C/S, Pyridium 181m tid x 2 days.  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   S/P breast biopsy, left    two o'clock position - benign   Toxic effect of venom(989.5) 07/27/2007   Tremor, unspecified 10/19/2015   Unstable gait 02/16/2017   Vaginal atrophy 10/19/2015   Venous insufficiency    Weakness 09/12/2018   Past Surgical History:  Procedure Laterality Date   BREAST BIOPSY Left    CATARACT EXTRACTION, BILATERAL      Allergies  Allergen Reactions   Doxycycline Other (See Comments)    Unknown   Sulfonamide Derivatives Other (See Comments)    Unknown     Allergies as of 09/10/2021       Reactions   Doxycycline Other (See Comments)   Unknown   Sulfonamide Derivatives Other (See Comments)   Unknown         Medication List        Accurate as of September 10, 2021 11:59 PM. If you have any questions, ask your nurse or doctor.          STOP taking these medications    cetirizine 10 MG tablet Commonly known as: ZYRTEC Stopped by: AVirgie Dad MD   estradiol 0.1 MG/GM vaginal cream Commonly known as: ESTRACE Stopped by: AVirgie Dad MD   hydrOXYzine 25 MG tablet Commonly known as: ATARAX Stopped by: AVirgie Dad MD       TAKE these medications    acetaminophen 500 MG tablet Commonly known as: TYLENOL Take 1,000 mg by mouth at bedtime.   ARY NASAL MIST ALLERGY/SINUS 2.65 % Soln Generic drug: Saline Place 1 spray into both nostrils in the morning and at bedtime.   calcium carbonate 750 MG chewable tablet Commonly known as: TUMS EX Chew 1 tablet by mouth daily.   carbidopa-levodopa 50-200 MG tablet Commonly known as: SINEMET CR Take 1 tablet by mouth at bedtime.   carbidopa-levodopa 25-100 MG disintegrating tablet Commonly known as: PARCOPA Take 2 tablets by mouth 5 (five) times daily.   carbidopa-levodopa 25-100 MG disintegrating tablet Commonly known as: PARCOPA Take 1 tablet by mouth See admin instructions.  May give one additional tablet daily in addition to scheduled dose as needed for increased symptoms related to Parkinson's. For taking at 11am if needed. Do not take after 1130 or within one hour of next dose.   Cranberry 450 MG Tabs Take 1 tablet by mouth daily.   diclofenac Sodium 1 % Gel Commonly known as: VOLTAREN Apply 2 g topically 3 (three) times daily as needed (Neck pain).   entacapone 200 MG tablet Commonly known as: COMTAN Take 200 mg by mouth 3 (three) times daily.   ferrous sulfate 325 (65 FE) MG tablet Take 325 mg by mouth every Monday, Wednesday, and Friday.   hydrocortisone 2.5 % rectal cream Commonly known as: ANUSOL-HC  Place 1 application. rectally daily as needed for hemorrhoids.   lactose free nutrition Liqd Take 237 mLs by mouth daily.   methocarbamol 500 MG tablet Commonly known as: ROBAXIN Take 250 mg by mouth daily as needed for muscle spasms.   midodrine 5 MG tablet Commonly known as: PROAMATINE Take 5 mg by mouth in the morning, at noon, and at bedtime.   mirabegron ER 50 MG Tb24 tablet Commonly known as: MYRBETRIQ Take 50 mg by mouth daily.   Neupro 1 MG/24HR Pt24 Generic drug: Rotigotine Place 1 patch onto the skin daily.   pantoprazole 40 MG tablet Commonly known as: PROTONIX Take 40 mg by mouth daily.   polyethylene glycol 17 g packet Commonly known as: MIRALAX / GLYCOLAX Take 17 g by mouth 2 (two) times daily.   polyvinyl alcohol 1.4 % ophthalmic solution Commonly known as: LIQUIFILM TEARS Place 1 drop into both eyes 3 (three) times daily as needed for dry eyes.   rivaroxaban 20 MG Tabs tablet Commonly known as: XARELTO Take 20 mg by mouth daily with supper.   simethicone 80 MG chewable tablet Commonly known as: MYLICON Chew 80 mg by mouth with breakfast, with lunch, and with evening meal.   sodium fluoride 1.1 % Crea dental cream Commonly known as: PREVIDENT 5000 PLUS Place 1 application. onto teeth in the morning and at  bedtime.   trimethoprim 100 MG tablet Commonly known as: TRIMPEX Take 100 mg by mouth at bedtime.   Vitamin D3 10 MCG (400 UNIT) Caps Take 1 capsule by mouth daily.   zinc oxide 20 % ointment Apply 1 application. topically as needed for irritation. Apply to buttocks after every incontinent episode and for redness        Review of Systems  Constitutional:  Positive for activity change. Negative for appetite change.  HENT: Negative.    Respiratory:  Negative for cough and shortness of breath.   Cardiovascular:  Negative for leg swelling.  Gastrointestinal:  Negative for constipation.  Genitourinary:  Positive for urgency.  Musculoskeletal:  Positive for gait problem. Negative for arthralgias and myalgias.  Skin: Negative.   Neurological:  Positive for weakness. Negative for dizziness.  Psychiatric/Behavioral:  Negative for confusion, dysphoric mood and sleep disturbance.     Immunization History  Administered Date(s) Administered   Influenza Split 10/20/2012   Influenza Whole 10/21/2006   Influenza, High Dose Seasonal PF 10/17/2016, 11/02/2019   Influenza-Unspecified 10/20/2013, 10/22/2017, 11/07/2020   Moderna Sars-Covid-2 Vaccination 01/22/2019, 02/19/2019, 11/29/2019, 06/19/2020   Pfizer Covid-19 Vaccine Bivalent Booster 75yr & up 10/10/2020   Pneumococcal Conjugate-13 01/31/2013   Pneumococcal Polysaccharide-23 01/20/2005, 11/24/2011   Td 01/21/2004   Tdap 03/16/2014   Zoster, Live 04/17/2009   Pertinent  Health Maintenance Due  Topic Date Due   INFLUENZA VACCINE  08/20/2021   DEXA SCAN  Completed   COLONOSCOPY (Pts 45-434yrInsurance coverage will need to be confirmed)  Discontinued      09/12/2018    4:00 AM 09/12/2018    8:12 AM 09/13/2018    8:30 PM 09/14/2018    8:38 AM 04/18/2021    8:05 AM  Fall Risk  Patient Fall Risk Level High fall risk High fall risk High fall risk High fall risk Moderate fall risk   Functional Status Survey:    Vitals:    09/10/21 1207  BP: (!) 96/58  Pulse: 68  Resp: 18  Temp: 97.8 F (36.6 C)  SpO2: 94%  Weight: 129 lb (58.5 kg)  Height: 5'  8" (1.727 m)   Body mass index is 19.61 kg/m. Physical Exam Vitals reviewed.  Constitutional:      Appearance: Normal appearance.  HENT:     Head: Normocephalic.     Nose: Nose normal.     Mouth/Throat:     Mouth: Mucous membranes are moist.     Pharynx: Oropharynx is clear.  Eyes:     Pupils: Pupils are equal, round, and reactive to light.  Cardiovascular:     Rate and Rhythm: Normal rate. Rhythm irregular.     Pulses: Normal pulses.     Heart sounds: Normal heart sounds. No murmur heard. Pulmonary:     Effort: Pulmonary effort is normal.     Breath sounds: Normal breath sounds.  Abdominal:     General: Abdomen is flat. Bowel sounds are normal.     Palpations: Abdomen is soft.  Musculoskeletal:        General: No swelling.     Cervical back: Neck supple.  Skin:    General: Skin is warm.  Neurological:     Mental Status: She is alert and oriented to person, place, and time.     Comments: No Focal weakness Pain in her Left shoulder which is more stiffer then Right  Psychiatric:        Mood and Affect: Mood normal.        Thought Content: Thought content normal.     Labs reviewed: Recent Labs    10/12/20 0000  NA 141  K 4.2  CL 109*  CO2 27*  BUN 20  CREATININE 0.7  CALCIUM 9.0   Recent Labs    10/12/20 0000  AST 11*  ALT 5*  ALKPHOS 53  ALBUMIN 3.6   Recent Labs    10/12/20 0000 11/22/20 0000  WBC 4.1 6.0  NEUTROABS 2,817.00 4,620.00  HGB 11.0* 11.8*  HCT 35* 36  PLT 160 181   Lab Results  Component Value Date   TSH 1.30 03/29/2020   No results found for: "HGBA1C" Lab Results  Component Value Date   CHOL 185 03/24/2019   HDL 79 (A) 03/24/2019   LDLCALC 94 03/24/2019   LDLDIRECT 104.2 01/25/2013   TRIG 41 03/24/2019   CHOLHDL 2 08/29/2015    Significant Diagnostic Results in last 30 days:  No results  found.  Assessment/Plan 1. Primary parkinsonism ([place]) On Sinemet,Comtan and Neupro patch Is going to see Neurology in [facility] in next few weeks I have written for therapy to restart as she was doing better with that Also talked about her Upper body weakness causing her to lean forward Possible High Back chair  2. Paroxysmal atrial fibrillation ([facility]) On Xarelto Rate control  3. Recurrent UTI Now on Trimethoprim  4. 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 Iron deficiency anemia due to chronic blood loss Stable on Iron  6. Urge incontinence of urine On myrbetriq 7 Bilateral Shoulder Pain She is not agreeing to see Ortho right now Tylenol PRN and Therapy    Family/ staff Communication:   Labs/tests ordered:   Total time spent in this patient care encounter was  45_  minutes; greater than 50% of the visit spent counseling patient and staff, reviewing records , Labs and coordinating care for problems addressed at this encounter.

## 2021-10-11 ENCOUNTER — Non-Acute Institutional Stay (SKILLED_NURSING_FACILITY): Payer: Medicare PPO | Admitting: Nurse Practitioner

## 2021-10-11 ENCOUNTER — Encounter: Payer: Self-pay | Admitting: Nurse Practitioner

## 2021-10-11 DIAGNOSIS — K5901 Slow transit constipation: Secondary | ICD-10-CM | POA: Diagnosis not present

## 2021-10-11 DIAGNOSIS — K219 Gastro-esophageal reflux disease without esophagitis: Secondary | ICD-10-CM

## 2021-10-11 DIAGNOSIS — G20 Parkinson's disease: Secondary | ICD-10-CM | POA: Diagnosis not present

## 2021-10-11 DIAGNOSIS — F419 Anxiety disorder, unspecified: Secondary | ICD-10-CM

## 2021-10-11 DIAGNOSIS — D500 Iron deficiency anemia secondary to blood loss (chronic): Secondary | ICD-10-CM | POA: Diagnosis not present

## 2021-10-11 DIAGNOSIS — I872 Venous insufficiency (chronic) (peripheral): Secondary | ICD-10-CM

## 2021-10-11 DIAGNOSIS — N3941 Urge incontinence: Secondary | ICD-10-CM | POA: Diagnosis not present

## 2021-10-11 DIAGNOSIS — Z87440 Personal history of urinary (tract) infections: Secondary | ICD-10-CM

## 2021-10-11 DIAGNOSIS — I480 Paroxysmal atrial fibrillation: Secondary | ICD-10-CM | POA: Diagnosis not present

## 2021-10-11 DIAGNOSIS — M159 Polyosteoarthritis, unspecified: Secondary | ICD-10-CM | POA: Diagnosis not present

## 2021-10-11 DIAGNOSIS — I739 Peripheral vascular disease, unspecified: Secondary | ICD-10-CM

## 2021-10-11 NOTE — Progress Notes (Signed)
Location:  [facility] Room Number: NO/13/A Place of Service:  SNF (31) Provider:  Ligia Duguay X, NP [name], MD  Patient Care Team: [name], MD as PCP - General (Internal Medicine) [name], [name], MD as PCP - Cardiology (Cardiology) [name], [name], DO as Consulting Physician (Neurology) Nayeli Calvert X, NP as Nurse Practitioner (Internal Medicine)  Extended Emergency Contact Information Primary Emergency Contact: Raenell, [place] Mobile Phone: [phone] Relation: Daughter Interpreter needed? No Secondary Emergency Contact: [facility] Address: [address]          [place], [place] [address] [name] of [place] Phone: [phone] Mobile Phone: [phone] Relation: Son  Code Status:  DNR Goals of care: Advanced Directive information    10/11/2021    8:47 AM  Advanced Directives  Does Patient Have a Medical Advance Directive? Yes  Type of Paramedic of [place];Out of facility DNR (pink MOST or yellow form)  Does patient want to make changes to medical advance directive? No - Patient declined  Copy of [place] in Chart? Yes - validated most recent copy scanned in chart (See row information)     Chief Complaint  Patient presents with   Medical Management of Chronic Issues    Patient is here for a follow up for chronic conditions    Quality Metric Gaps    Patient is due for covid and flu vaccine has had first shingrix vaccine    HPI:  Pt is a 81 y.o. female seen today for medical management of chronic diseases.      Anxiety, chronic, situational, had prn Hydroxyzine in the past.              PAD 12/25/20 ABI R+L 0.7, moderate arterial disease, vascular specialist if desires.              GERD, takes Protonix, Simethicone.              The patient has chronic edema BLE. Off Metolazone 09/27/19. Echo EF normal in the past.              Afib, heart rate is in control, on Xarelto,  off Diltiazem. Hgb 11.8 11/22/20, TSH 1.30 03/29/20             OA,  on Tylenol, Methocarbamol, shoulder pain, f/u Ortho Parkinson's, symptoms improved, on Sinemet, Comtan, started Neupro 05/08/21,  f/u neurology at [facility]. double vision, chronic, saw Ophthalmology              Orthostatic hypotension, takes Midodrine.              Anemia, stable, Hgb 11.8 11/22/20,  takes Fe             Urge incontinent of urine, takes Mirabegron, needs urinate q3-4 hours to avoid bladder discomfort.              Recurrent UTI, saw Urology, placed on Estrace x2/wk, TMP 165m daily             Constipation, takes MiraLax  Past Medical History:  Diagnosis Date   Acute lower UTI 09/14/2018   Anemia 10/15/2018   2016 colonoscopy 10/14/18 wbc 5.4, Hgb 10.1, plt 184 12/01/18 wbc 4.5, Hgb 11.1, plt 188, neutrophils 63,  Na 139, K 3.7, Bun 23, creat 0.69, eGFR 83 on Fe, Hgb 11.7 12/09/18    Atrial fibrillation ([place] 05/21/2010   10/07/18 Na 143, K 4.0, Bun 20,  creat 0.79, eGFR 72, wbc 7.1, Hgb 10.9, plt 172 10/28/18 Na 142, K 4.0, Bun 19, creat 0.77, eGFR 74 11/18/18 Na 144, K 4.1, Bun 21, creat 0.69, eGFR 83   Bilateral lower extremity edema 07/19/2018   11/02/18 BMP 2 weeks.    Cervical pain (neck) 06/20/2010   Closed fracture of part of upper end of humerus 05/01/2015   Colles' fracture of right radius 03/05/2015   Constipation 07/19/2018   DEGENERATIVE JOINT DISEASE, RIGHT HIP 02/16/2007   Dupuytren's contracture    Dysuria 09/20/2018   09/20/18 c/o got up several times last night to go urinate, burning on urination, lower abd /back discomfort, but urinary frequency, leakage are not new. UA C/S, Pyridium 138m tid x 2 days.  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/2017   S/P breast biopsy, left    two o'clock position - benign   Toxic effect of venom(989.5) 07/27/2007   Tremor, unspecified 10/19/2015   Unstable gait 02/16/2017   Vaginal atrophy 10/19/2015   Venous insufficiency    Weakness 09/12/2018   Past Surgical History:  Procedure Laterality Date   BREAST BIOPSY Left    CATARACT EXTRACTION, BILATERAL      Allergies  Allergen Reactions   Doxycycline Other (See Comments)    Unknown   Sulfonamide Derivatives Other (See Comments)    Unknown     Outpatient Encounter Medications as of 10/11/2021  Medication Sig   acetaminophen (TYLENOL) 500 MG tablet Take 1,000 mg by mouth at bedtime.   calcium carbonate (TUMS EX) 750 MG chewable tablet Chew 1 tablet by mouth daily.   carbidopa-levodopa (PARCOPA) 25-100 MG disintegrating tablet Take 2 tablets by mouth 5 (five) times daily.   carbidopa-levodopa (PARCOPA) 25-100 MG disintegrating tablet Take 1 tablet by mouth See admin instructions. May give one additional tablet daily in addition to scheduled dose as needed for increased symptoms related to Parkinson's. For taking at 11am if needed. Do not take after 1130 or within one hour of next dose.   carbidopa-levodopa (SINEMET CR) 50-200 MG tablet Take 1 tablet by mouth at bedtime.   Cholecalciferol (VITAMIN D3) 10 MCG (400 UNIT) CAPS Take 1 capsule by mouth daily.   Cranberry 450 MG TABS Take 1 tablet by mouth daily.   diclofenac Sodium (VOLTAREN) 1 % GEL Apply 2 g topically 3 (three) times daily as needed (Neck pain).   entacapone (COMTAN) 200 MG tablet Take 200 mg by mouth 3 (three) times daily.   ferrous sulfate 325 (65 FE) MG tablet Take 325 mg by mouth every Monday, Wednesday, and Friday.   hydrocortisone (ANUSOL-HC) 2.5 % rectal cream Place 1 application.  rectally daily as needed for hemorrhoids.   lactose free nutrition (BOOST) LIQD Take 237 mLs by mouth daily.   methocarbamol (ROBAXIN) 500 MG tablet Take 250 mg by mouth daily as needed for muscle spasms.   midodrine (PROAMATINE) 5 MG tablet Take 5 mg by mouth in the morning, at noon, and at bedtime.   mirabegron ER (MYRBETRIQ)  50 MG TB24 tablet Take 50 mg by mouth daily.   pantoprazole (PROTONIX) 40 MG tablet Take 40 mg by mouth daily.   polyethylene glycol (MIRALAX / GLYCOLAX) 17 g packet Take 17 g by mouth 2 (two) times daily.   polyvinyl alcohol (LIQUIFILM TEARS) 1.4 % ophthalmic solution Place 1 drop into both eyes 3 (three) times daily as needed for dry eyes.   rivaroxaban (XARELTO) 20 MG TABS tablet Take 20 mg by mouth daily with supper.   Rotigotine (NEUPRO) 1 MG/24HR PT24 Place 1 patch onto the skin daily.   Saline (ARY NASAL MIST ALLERGY/SINUS) 2.65 % SOLN Place 1 spray into both nostrils in the morning and at bedtime.   simethicone (MYLICON) 80 MG chewable tablet Chew 80 mg by mouth with breakfast, with lunch, and with evening meal.   sodium fluoride (PREVIDENT 5000 PLUS) 1.1 % CREA dental cream Place 1 application. onto teeth in the morning and at bedtime.   trimethoprim (TRIMPEX) 100 MG tablet Take 100 mg by mouth at bedtime.   zinc oxide 20 % ointment Apply 1 application. topically as needed for irritation. Apply to buttocks after every incontinent episode and for redness   No facility-administered encounter medications on file as of 10/11/2021.    Review of Systems  Constitutional:  Negative for appetite change, fatigue and fever.  HENT:  Positive for hearing loss. Negative for congestion, rhinorrhea and trouble swallowing.   Eyes:  Positive for visual disturbance.       C/o double vision  Respiratory:  Positive for shortness of breath. Negative for cough, chest tightness and wheezing.   Cardiovascular:  Positive for leg swelling.  Gastrointestinal:  Negative for abdominal  pain and constipation.  Genitourinary:  Positive for frequency. Negative for dysuria and urgency.       Bladder discomfort prior to urination after waited for over 4-5 hours.   Musculoskeletal:  Positive for arthralgias, back pain and gait problem. Negative for myalgias.       Lower back discomfort positional. Chronic shoulder pain L>R, full ROM  Skin:  Negative for color change.  Neurological:  Positive for tremors. Negative for speech difficulty, weakness and headaches.       Moves slow, fine tremor in fingers, burning sensation  in the R+ L great toes when touched by sheets, comes/goes  Psychiatric/Behavioral:  Negative for behavioral problems and sleep disturbance. The patient is nervous/anxious.        Feels anxious sometimes.     Immunization History  Administered Date(s) Administered   Influenza Split 10/20/2012   Influenza Whole 10/21/2006   Influenza, High Dose Seasonal PF 10/17/2016, 11/02/2019   Influenza-Unspecified 10/20/2013, 10/22/2017, 11/07/2020   Moderna Sars-Covid-2 Vaccination 01/22/2019, 02/19/2019, 11/29/2019, 06/19/2020   Pfizer Covid-19 Vaccine Bivalent Booster 42yr & up 10/10/2020   Pneumococcal Conjugate-13 01/31/2013   Pneumococcal Polysaccharide-23 01/20/2005, 11/24/2011   Td 01/21/2004   Tdap 03/16/2014   Zoster, Live 04/17/2009   Pertinent  Health Maintenance Due  Topic Date Due   INFLUENZA VACCINE  08/20/2021   DEXA SCAN  Completed   COLONOSCOPY (Pts [phone] coverage will need to be confirmed)  Discontinued      09/12/2018    8:12 AM 09/13/2018    8:30 PM 09/14/2018    8:38 AM 04/18/2021    8:05 AM 10/11/2021    8:48 AM  Fall Risk  Falls in the past year?     0  Was there an injury with Fall?     0  Fall Risk Category Calculator     0  Fall Risk Category     Low  Patient Fall Risk Level High fall risk High fall risk High fall risk Moderate fall risk Low fall risk  Patient at Risk for Falls Due to     No Fall Risks  Fall risk Follow  up     Falls evaluation completed   Functional Status Survey:    Vitals:   10/11/21 0847  BP: 104/60  Pulse: 64  Resp: 16  Temp: (!) 97.5 F (36.4 C)  SpO2: 92%  Weight: 131 lb (59.4 kg)  Height: '5\' 8"'  (1.727 m)   Body mass index is 19.92 kg/m. Physical Exam Vitals and nursing note reviewed.  Constitutional:      Appearance: Normal appearance.  HENT:     Head: Normocephalic and atraumatic.     Nose: Nose normal.     Mouth/Throat:     Mouth: Mucous membranes are moist.  Eyes:     Extraocular Movements: Extraocular movements intact.     Conjunctiva/sclera: Conjunctivae normal.     Pupils: Pupils are equal, round, and reactive to light.     Comments: Chronic double vision, see Ophthalmology 09/20/20  Cardiovascular:     Rate and Rhythm: Normal rate. Rhythm irregular.     Heart sounds: No murmur heard.    Comments: DP pulses present L>R Pulmonary:     Effort: Pulmonary effort is normal.     Breath sounds: No wheezing or rales.  Abdominal:     General: Bowel sounds are normal.     Palpations: Abdomen is soft.     Tenderness: There is no abdominal tenderness.  Musculoskeletal:     Cervical back: Normal range of motion and neck supple.     Right lower leg: Edema present.     Left lower leg: Edema present.     Comments: trace edema LLE, 1+ RLE. Chronic lower back, shoulder R+L pain.   Skin:    General: Skin is warm and dry.     Comments: ABI showed moderate PAD  Neurological:     General: No focal deficit present.     Mental Status: She is alert and oriented to person, place, and time. Mental status is at baseline.     Coordination: Coordination abnormal.     Gait: Gait abnormal.     Comments: Moves slow, tremor in hands. No facial or limb weakness.   Psychiatric:        Mood and Affect: Mood normal.        Behavior: Behavior normal.     Labs reviewed: No results for input(s): "NA", "K", "CL", "CO2", "GLUCOSE", "BUN", "CREATININE", "CALCIUM", "MG", "PHOS" in the  last 8760 hours.  No results for input(s): "AST", "ALT", "ALKPHOS", "BILITOT", "PROT", "ALBUMIN" in the last 8760 hours.  Recent Labs    11/22/20 0000  WBC 6.0  NEUTROABS 4,620.00  HGB 11.8*  HCT 36  PLT 181   Lab Results  Component Value Date   TSH 1.30 03/29/2020   No results found for: "HGBA1C" Lab Results  Component Value Date   CHOL 185 03/24/2019   HDL 79 (A) 03/24/2019   LDLCALC 94 03/24/2019   LDLDIRECT 104.2 01/25/2013   TRIG 41 03/24/2019   CHOLHDL 2 08/29/2015    Significant Diagnostic Results in last 30 days:  No results found.  Assessment/Plan Atrial fibrillation (HCC) heart rate is in control, on Xarelto, off Diltiazem. Hgb 11.8 11/22/20, TSH 1.30 03/29/20  Osteoarthritis involving multiple joints on both sides of body on Tylenol, Methocarbamol, shoulder pain, f/u Ortho  Parkinsonism (HCC) symptoms improved, on Sinemet, Comtan, started Neupro 05/08/21,  f/u neurology at [facility]. double vision, chronic, saw Ophthalmology   Anemia stable, Hgb 11.8 11/22/20,  takes Fe  Urge incontinence of urine  takes Mirabegron, needs urinate q3-4 hours to avoid bladder discomfort.   History of recurrent UTIs  saw Urology, placed on Estrace x2/wk, TMP 115m daily  Slow transit constipation Stable, takes MiraLax  Edema of left lower extremity due to peripheral venous insufficiency The patient has chronic edema BLE. Off Metolazone 09/27/19. Echo EF normal in the past.   GERD (gastroesophageal reflux disease) Stable,  takes Protonix, Simethicone.   PAD (peripheral artery disease) ([place] 12/25/20 ABI R+L 0.7, moderate arterial disease, vascular specialist if desires.   Anxiety chronic, situational, had prn Hydroxyzine in the past.      Family/ staff Communication: plan of care reviewed with the patient and charge nurse.   Labs/tests ordered:  UA C/S per HPOA request.   Time spend 35 minutes.

## 2021-10-14 NOTE — Assessment & Plan Note (Signed)
Stable, takes MiraLax  

## 2021-10-14 NOTE — Assessment & Plan Note (Signed)
symptoms improved, on Sinemet, Comtan, started Neupro 05/08/21,  f/u neurology at [facility]. double vision, chronic, saw Ophthalmology  

## 2021-10-14 NOTE — Assessment & Plan Note (Signed)
stable, Hgb 11.8 11/22/20,  takes Fe 

## 2021-10-14 NOTE — Assessment & Plan Note (Signed)
takes Mirabegron, needs urinate q3-4 hours to avoid bladder discomfort.  

## 2021-10-14 NOTE — Assessment & Plan Note (Signed)
chronic, situational, had prn Hydroxyzine in the past.

## 2021-10-14 NOTE — Assessment & Plan Note (Signed)
The patient has chronic edema BLE. Off Metolazone 09/27/19. Echo EF normal in the past.  

## 2021-10-14 NOTE — Assessment & Plan Note (Signed)
saw Urology, placed on Estrace x2/wk, TMP '100mg'$  daily

## 2021-10-14 NOTE — Assessment & Plan Note (Signed)
12/25/20 ABI R+L 0.7, moderate arterial disease, vascular specialist if desires.  

## 2021-10-14 NOTE — Assessment & Plan Note (Signed)
on Tylenol, Methocarbamol, shoulder pain, f/u Ortho 

## 2021-10-14 NOTE — Assessment & Plan Note (Signed)
heart rate is in control, on Xarelto, off Diltiazem. Hgb 11.8 11/22/20, TSH 1.30 03/29/20 

## 2021-10-14 NOTE — Assessment & Plan Note (Signed)
Stable, takes Protonix, Simethicone.  

## 2021-10-15 DIAGNOSIS — L84 Corns and callosities: Secondary | ICD-10-CM | POA: Diagnosis not present

## 2021-10-15 DIAGNOSIS — L602 Onychogryphosis: Secondary | ICD-10-CM | POA: Diagnosis not present

## 2021-10-21 ENCOUNTER — Encounter: Payer: Self-pay | Admitting: Nurse Practitioner

## 2021-10-21 DIAGNOSIS — R2681 Unsteadiness on feet: Secondary | ICD-10-CM | POA: Diagnosis not present

## 2021-10-21 DIAGNOSIS — R293 Abnormal posture: Secondary | ICD-10-CM | POA: Diagnosis not present

## 2021-10-21 DIAGNOSIS — R1312 Dysphagia, oropharyngeal phase: Secondary | ICD-10-CM | POA: Diagnosis not present

## 2021-10-21 DIAGNOSIS — M2569 Stiffness of other specified joint, not elsewhere classified: Secondary | ICD-10-CM | POA: Diagnosis not present

## 2021-10-21 DIAGNOSIS — R471 Dysarthria and anarthria: Secondary | ICD-10-CM | POA: Diagnosis not present

## 2021-10-21 DIAGNOSIS — M6281 Muscle weakness (generalized): Secondary | ICD-10-CM | POA: Diagnosis not present

## 2021-10-21 DIAGNOSIS — R41841 Cognitive communication deficit: Secondary | ICD-10-CM | POA: Diagnosis not present

## 2021-10-21 DIAGNOSIS — R29898 Other symptoms and signs involving the musculoskeletal system: Secondary | ICD-10-CM | POA: Diagnosis not present

## 2021-10-22 DIAGNOSIS — R41841 Cognitive communication deficit: Secondary | ICD-10-CM | POA: Diagnosis not present

## 2021-10-22 DIAGNOSIS — R1312 Dysphagia, oropharyngeal phase: Secondary | ICD-10-CM | POA: Diagnosis not present

## 2021-10-22 DIAGNOSIS — M2569 Stiffness of other specified joint, not elsewhere classified: Secondary | ICD-10-CM | POA: Diagnosis not present

## 2021-10-22 DIAGNOSIS — R29898 Other symptoms and signs involving the musculoskeletal system: Secondary | ICD-10-CM | POA: Diagnosis not present

## 2021-10-22 DIAGNOSIS — R471 Dysarthria and anarthria: Secondary | ICD-10-CM | POA: Diagnosis not present

## 2021-10-22 DIAGNOSIS — N390 Urinary tract infection, site not specified: Secondary | ICD-10-CM | POA: Diagnosis not present

## 2021-10-22 DIAGNOSIS — R2681 Unsteadiness on feet: Secondary | ICD-10-CM | POA: Diagnosis not present

## 2021-10-22 DIAGNOSIS — R293 Abnormal posture: Secondary | ICD-10-CM | POA: Diagnosis not present

## 2021-10-22 DIAGNOSIS — M6281 Muscle weakness (generalized): Secondary | ICD-10-CM | POA: Diagnosis not present

## 2021-10-23 DIAGNOSIS — R29898 Other symptoms and signs involving the musculoskeletal system: Secondary | ICD-10-CM | POA: Diagnosis not present

## 2021-10-23 DIAGNOSIS — R1312 Dysphagia, oropharyngeal phase: Secondary | ICD-10-CM | POA: Diagnosis not present

## 2021-10-23 DIAGNOSIS — R2681 Unsteadiness on feet: Secondary | ICD-10-CM | POA: Diagnosis not present

## 2021-10-23 DIAGNOSIS — R471 Dysarthria and anarthria: Secondary | ICD-10-CM | POA: Diagnosis not present

## 2021-10-23 DIAGNOSIS — R293 Abnormal posture: Secondary | ICD-10-CM | POA: Diagnosis not present

## 2021-10-23 DIAGNOSIS — R41841 Cognitive communication deficit: Secondary | ICD-10-CM | POA: Diagnosis not present

## 2021-10-23 DIAGNOSIS — M2569 Stiffness of other specified joint, not elsewhere classified: Secondary | ICD-10-CM | POA: Diagnosis not present

## 2021-10-23 DIAGNOSIS — M6281 Muscle weakness (generalized): Secondary | ICD-10-CM | POA: Diagnosis not present

## 2021-10-24 DIAGNOSIS — R29898 Other symptoms and signs involving the musculoskeletal system: Secondary | ICD-10-CM | POA: Diagnosis not present

## 2021-10-24 DIAGNOSIS — R2681 Unsteadiness on feet: Secondary | ICD-10-CM | POA: Diagnosis not present

## 2021-10-24 DIAGNOSIS — R471 Dysarthria and anarthria: Secondary | ICD-10-CM | POA: Diagnosis not present

## 2021-10-24 DIAGNOSIS — R1312 Dysphagia, oropharyngeal phase: Secondary | ICD-10-CM | POA: Diagnosis not present

## 2021-10-24 DIAGNOSIS — R293 Abnormal posture: Secondary | ICD-10-CM | POA: Diagnosis not present

## 2021-10-24 DIAGNOSIS — M2569 Stiffness of other specified joint, not elsewhere classified: Secondary | ICD-10-CM | POA: Diagnosis not present

## 2021-10-24 DIAGNOSIS — M6281 Muscle weakness (generalized): Secondary | ICD-10-CM | POA: Diagnosis not present

## 2021-10-24 DIAGNOSIS — R41841 Cognitive communication deficit: Secondary | ICD-10-CM | POA: Diagnosis not present

## 2021-10-25 DIAGNOSIS — R1312 Dysphagia, oropharyngeal phase: Secondary | ICD-10-CM | POA: Diagnosis not present

## 2021-10-25 DIAGNOSIS — R293 Abnormal posture: Secondary | ICD-10-CM | POA: Diagnosis not present

## 2021-10-25 DIAGNOSIS — M6281 Muscle weakness (generalized): Secondary | ICD-10-CM | POA: Diagnosis not present

## 2021-10-25 DIAGNOSIS — M2569 Stiffness of other specified joint, not elsewhere classified: Secondary | ICD-10-CM | POA: Diagnosis not present

## 2021-10-25 DIAGNOSIS — R2681 Unsteadiness on feet: Secondary | ICD-10-CM | POA: Diagnosis not present

## 2021-10-25 DIAGNOSIS — R41841 Cognitive communication deficit: Secondary | ICD-10-CM | POA: Diagnosis not present

## 2021-10-25 DIAGNOSIS — R29898 Other symptoms and signs involving the musculoskeletal system: Secondary | ICD-10-CM | POA: Diagnosis not present

## 2021-10-25 DIAGNOSIS — R471 Dysarthria and anarthria: Secondary | ICD-10-CM | POA: Diagnosis not present

## 2021-10-28 DIAGNOSIS — R2681 Unsteadiness on feet: Secondary | ICD-10-CM | POA: Diagnosis not present

## 2021-10-28 DIAGNOSIS — R471 Dysarthria and anarthria: Secondary | ICD-10-CM | POA: Diagnosis not present

## 2021-10-28 DIAGNOSIS — R1312 Dysphagia, oropharyngeal phase: Secondary | ICD-10-CM | POA: Diagnosis not present

## 2021-10-28 DIAGNOSIS — R293 Abnormal posture: Secondary | ICD-10-CM | POA: Diagnosis not present

## 2021-10-28 DIAGNOSIS — M6281 Muscle weakness (generalized): Secondary | ICD-10-CM | POA: Diagnosis not present

## 2021-10-28 DIAGNOSIS — R29898 Other symptoms and signs involving the musculoskeletal system: Secondary | ICD-10-CM | POA: Diagnosis not present

## 2021-10-28 DIAGNOSIS — R41841 Cognitive communication deficit: Secondary | ICD-10-CM | POA: Diagnosis not present

## 2021-10-28 DIAGNOSIS — M2569 Stiffness of other specified joint, not elsewhere classified: Secondary | ICD-10-CM | POA: Diagnosis not present

## 2021-10-29 DIAGNOSIS — M6281 Muscle weakness (generalized): Secondary | ICD-10-CM | POA: Diagnosis not present

## 2021-10-29 DIAGNOSIS — M2569 Stiffness of other specified joint, not elsewhere classified: Secondary | ICD-10-CM | POA: Diagnosis not present

## 2021-10-29 DIAGNOSIS — R41841 Cognitive communication deficit: Secondary | ICD-10-CM | POA: Diagnosis not present

## 2021-10-29 DIAGNOSIS — R2681 Unsteadiness on feet: Secondary | ICD-10-CM | POA: Diagnosis not present

## 2021-10-29 DIAGNOSIS — R471 Dysarthria and anarthria: Secondary | ICD-10-CM | POA: Diagnosis not present

## 2021-10-29 DIAGNOSIS — R1312 Dysphagia, oropharyngeal phase: Secondary | ICD-10-CM | POA: Diagnosis not present

## 2021-10-29 DIAGNOSIS — R293 Abnormal posture: Secondary | ICD-10-CM | POA: Diagnosis not present

## 2021-10-29 DIAGNOSIS — R29898 Other symptoms and signs involving the musculoskeletal system: Secondary | ICD-10-CM | POA: Diagnosis not present

## 2021-10-30 DIAGNOSIS — R41841 Cognitive communication deficit: Secondary | ICD-10-CM | POA: Diagnosis not present

## 2021-10-30 DIAGNOSIS — R471 Dysarthria and anarthria: Secondary | ICD-10-CM | POA: Diagnosis not present

## 2021-10-30 DIAGNOSIS — R2681 Unsteadiness on feet: Secondary | ICD-10-CM | POA: Diagnosis not present

## 2021-10-30 DIAGNOSIS — M2569 Stiffness of other specified joint, not elsewhere classified: Secondary | ICD-10-CM | POA: Diagnosis not present

## 2021-10-30 DIAGNOSIS — R29898 Other symptoms and signs involving the musculoskeletal system: Secondary | ICD-10-CM | POA: Diagnosis not present

## 2021-10-30 DIAGNOSIS — M6281 Muscle weakness (generalized): Secondary | ICD-10-CM | POA: Diagnosis not present

## 2021-10-30 DIAGNOSIS — R1312 Dysphagia, oropharyngeal phase: Secondary | ICD-10-CM | POA: Diagnosis not present

## 2021-10-30 DIAGNOSIS — R293 Abnormal posture: Secondary | ICD-10-CM | POA: Diagnosis not present

## 2021-10-31 DIAGNOSIS — R1312 Dysphagia, oropharyngeal phase: Secondary | ICD-10-CM | POA: Diagnosis not present

## 2021-10-31 DIAGNOSIS — R29898 Other symptoms and signs involving the musculoskeletal system: Secondary | ICD-10-CM | POA: Diagnosis not present

## 2021-10-31 DIAGNOSIS — R471 Dysarthria and anarthria: Secondary | ICD-10-CM | POA: Diagnosis not present

## 2021-10-31 DIAGNOSIS — R2681 Unsteadiness on feet: Secondary | ICD-10-CM | POA: Diagnosis not present

## 2021-10-31 DIAGNOSIS — R293 Abnormal posture: Secondary | ICD-10-CM | POA: Diagnosis not present

## 2021-10-31 DIAGNOSIS — R41841 Cognitive communication deficit: Secondary | ICD-10-CM | POA: Diagnosis not present

## 2021-10-31 DIAGNOSIS — M6281 Muscle weakness (generalized): Secondary | ICD-10-CM | POA: Diagnosis not present

## 2021-10-31 DIAGNOSIS — M2569 Stiffness of other specified joint, not elsewhere classified: Secondary | ICD-10-CM | POA: Diagnosis not present

## 2021-11-01 DIAGNOSIS — M6281 Muscle weakness (generalized): Secondary | ICD-10-CM | POA: Diagnosis not present

## 2021-11-01 DIAGNOSIS — R41841 Cognitive communication deficit: Secondary | ICD-10-CM | POA: Diagnosis not present

## 2021-11-01 DIAGNOSIS — R1312 Dysphagia, oropharyngeal phase: Secondary | ICD-10-CM | POA: Diagnosis not present

## 2021-11-01 DIAGNOSIS — M2569 Stiffness of other specified joint, not elsewhere classified: Secondary | ICD-10-CM | POA: Diagnosis not present

## 2021-11-01 DIAGNOSIS — R2681 Unsteadiness on feet: Secondary | ICD-10-CM | POA: Diagnosis not present

## 2021-11-01 DIAGNOSIS — R29898 Other symptoms and signs involving the musculoskeletal system: Secondary | ICD-10-CM | POA: Diagnosis not present

## 2021-11-01 DIAGNOSIS — R471 Dysarthria and anarthria: Secondary | ICD-10-CM | POA: Diagnosis not present

## 2021-11-01 DIAGNOSIS — R293 Abnormal posture: Secondary | ICD-10-CM | POA: Diagnosis not present

## 2021-11-04 DIAGNOSIS — R293 Abnormal posture: Secondary | ICD-10-CM | POA: Diagnosis not present

## 2021-11-04 DIAGNOSIS — R471 Dysarthria and anarthria: Secondary | ICD-10-CM | POA: Diagnosis not present

## 2021-11-04 DIAGNOSIS — R41841 Cognitive communication deficit: Secondary | ICD-10-CM | POA: Diagnosis not present

## 2021-11-04 DIAGNOSIS — M2569 Stiffness of other specified joint, not elsewhere classified: Secondary | ICD-10-CM | POA: Diagnosis not present

## 2021-11-04 DIAGNOSIS — R2681 Unsteadiness on feet: Secondary | ICD-10-CM | POA: Diagnosis not present

## 2021-11-04 DIAGNOSIS — M6281 Muscle weakness (generalized): Secondary | ICD-10-CM | POA: Diagnosis not present

## 2021-11-04 DIAGNOSIS — R29898 Other symptoms and signs involving the musculoskeletal system: Secondary | ICD-10-CM | POA: Diagnosis not present

## 2021-11-04 DIAGNOSIS — R1312 Dysphagia, oropharyngeal phase: Secondary | ICD-10-CM | POA: Diagnosis not present

## 2021-11-05 DIAGNOSIS — M6281 Muscle weakness (generalized): Secondary | ICD-10-CM | POA: Diagnosis not present

## 2021-11-05 DIAGNOSIS — R293 Abnormal posture: Secondary | ICD-10-CM | POA: Diagnosis not present

## 2021-11-05 DIAGNOSIS — R2681 Unsteadiness on feet: Secondary | ICD-10-CM | POA: Diagnosis not present

## 2021-11-05 DIAGNOSIS — R29898 Other symptoms and signs involving the musculoskeletal system: Secondary | ICD-10-CM | POA: Diagnosis not present

## 2021-11-05 DIAGNOSIS — M2569 Stiffness of other specified joint, not elsewhere classified: Secondary | ICD-10-CM | POA: Diagnosis not present

## 2021-11-05 DIAGNOSIS — R471 Dysarthria and anarthria: Secondary | ICD-10-CM | POA: Diagnosis not present

## 2021-11-05 DIAGNOSIS — R41841 Cognitive communication deficit: Secondary | ICD-10-CM | POA: Diagnosis not present

## 2021-11-05 DIAGNOSIS — R1312 Dysphagia, oropharyngeal phase: Secondary | ICD-10-CM | POA: Diagnosis not present

## 2021-11-07 DIAGNOSIS — R2681 Unsteadiness on feet: Secondary | ICD-10-CM | POA: Diagnosis not present

## 2021-11-07 DIAGNOSIS — M6281 Muscle weakness (generalized): Secondary | ICD-10-CM | POA: Diagnosis not present

## 2021-11-07 DIAGNOSIS — R1312 Dysphagia, oropharyngeal phase: Secondary | ICD-10-CM | POA: Diagnosis not present

## 2021-11-07 DIAGNOSIS — R29898 Other symptoms and signs involving the musculoskeletal system: Secondary | ICD-10-CM | POA: Diagnosis not present

## 2021-11-07 DIAGNOSIS — M2569 Stiffness of other specified joint, not elsewhere classified: Secondary | ICD-10-CM | POA: Diagnosis not present

## 2021-11-07 DIAGNOSIS — R41841 Cognitive communication deficit: Secondary | ICD-10-CM | POA: Diagnosis not present

## 2021-11-07 DIAGNOSIS — R471 Dysarthria and anarthria: Secondary | ICD-10-CM | POA: Diagnosis not present

## 2021-11-07 DIAGNOSIS — R293 Abnormal posture: Secondary | ICD-10-CM | POA: Diagnosis not present

## 2021-11-08 ENCOUNTER — Encounter: Payer: Self-pay | Admitting: Nurse Practitioner

## 2021-11-08 ENCOUNTER — Non-Acute Institutional Stay (SKILLED_NURSING_FACILITY): Payer: Medicare PPO | Admitting: Nurse Practitioner

## 2021-11-08 DIAGNOSIS — B3749 Other urogenital candidiasis: Secondary | ICD-10-CM | POA: Diagnosis not present

## 2021-11-08 DIAGNOSIS — N3941 Urge incontinence: Secondary | ICD-10-CM

## 2021-11-08 DIAGNOSIS — D500 Iron deficiency anemia secondary to blood loss (chronic): Secondary | ICD-10-CM

## 2021-11-08 DIAGNOSIS — I739 Peripheral vascular disease, unspecified: Secondary | ICD-10-CM | POA: Diagnosis not present

## 2021-11-08 DIAGNOSIS — G20A1 Parkinson's disease without dyskinesia, without mention of fluctuations: Secondary | ICD-10-CM | POA: Diagnosis not present

## 2021-11-08 DIAGNOSIS — F419 Anxiety disorder, unspecified: Secondary | ICD-10-CM | POA: Diagnosis not present

## 2021-11-08 DIAGNOSIS — K5901 Slow transit constipation: Secondary | ICD-10-CM

## 2021-11-08 DIAGNOSIS — M159 Polyosteoarthritis, unspecified: Secondary | ICD-10-CM | POA: Diagnosis not present

## 2021-11-08 DIAGNOSIS — I480 Paroxysmal atrial fibrillation: Secondary | ICD-10-CM | POA: Diagnosis not present

## 2021-11-08 DIAGNOSIS — Z87440 Personal history of urinary (tract) infections: Secondary | ICD-10-CM

## 2021-11-08 DIAGNOSIS — I872 Venous insufficiency (chronic) (peripheral): Secondary | ICD-10-CM

## 2021-11-08 DIAGNOSIS — K219 Gastro-esophageal reflux disease without esophagitis: Secondary | ICD-10-CM | POA: Diagnosis not present

## 2021-11-08 DIAGNOSIS — Z993 Dependence on wheelchair: Secondary | ICD-10-CM | POA: Diagnosis not present

## 2021-11-08 DIAGNOSIS — G20A2 Parkinson's disease without dyskinesia, with fluctuations: Secondary | ICD-10-CM | POA: Diagnosis not present

## 2021-11-08 DIAGNOSIS — G20C Parkinsonism, unspecified: Secondary | ICD-10-CM

## 2021-11-08 DIAGNOSIS — Z79899 Other long term (current) drug therapy: Secondary | ICD-10-CM | POA: Diagnosis not present

## 2021-11-08 DIAGNOSIS — G903 Multi-system degeneration of the autonomic nervous system: Secondary | ICD-10-CM

## 2021-11-08 NOTE — Assessment & Plan Note (Signed)
takes Mirabegron, needs urinate q3-4 hours to avoid bladder discomfort.  

## 2021-11-08 NOTE — Assessment & Plan Note (Signed)
Hold for loose stools.

## 2021-11-08 NOTE — Assessment & Plan Note (Signed)
stable, Hgb 11.8 11/22/20,  takes Fe 

## 2021-11-08 NOTE — Assessment & Plan Note (Signed)
saw Urology, placed on Estrace x2/wk, TMP '100mg'$  daily

## 2021-11-08 NOTE — Progress Notes (Unsigned)
Location:   SNF [place] Room Number: 13 Place of Service:  SNF (31) Provider: [facility] Keilynn Marano NP  [name], MD  Patient Care Team: [name], MD as PCP - General (Internal Medicine) [name], [name], MD as PCP - Cardiology (Cardiology) [name], [name], DO as Consulting Physician (Neurology) Len Azeez X, NP as Nurse Practitioner (Internal Medicine)  Extended Emergency Contact Information Primary Emergency Contact: Sharnese, Heath Mobile Phone: [phone] Relation: Daughter Interpreter needed? No Secondary Emergency Contact: [facility] Address: [address]          [place], [place] [address] [name] of [place] Phone: [phone] Mobile Phone: [phone] Relation: Son  Code Status: DNR Goals of care: Advanced Directive information    10/11/2021    8:47 AM  Advanced Directives  Does Patient Have a Medical Advance Directive? Yes  Type of Paramedic of [place];Out of facility DNR (pink MOST or yellow form)  Does patient want to make changes to medical advance directive? No - Patient declined  Copy of [place] in Chart? Yes - validated most recent copy scanned in chart (See row information)     Chief Complaint  Patient presents with   Acute Visit    Urogenital area itching, redness, discomfort of vulva    HPI:  Pt is a 81 y.o. female seen today for an acute visit for Urogenital area itching, redness, discomfort of vulva, noted white discharge in the area. The patient denied burning sensation in urethra or suprapubic pain, pressure in the lower abd bladder region prior to urination is as usual, denied vaginal itching or pain.     Anxiety, chronic, situational, had prn Hydroxyzine in the past.              PAD 12/25/20 ABI R+L 0.7, moderate arterial disease, vascular specialist if desires.              GERD, takes Protonix, Simethicone.              The patient has chronic edema BLE. Off Metolazone  09/27/19. Echo EF normal in the past.              Afib, heart rate is in control, on Xarelto, off Diltiazem. Hgb 11.8 11/22/20, TSH 1.30 03/29/20             OA,  on Tylenol, Methocarbamol, shoulder pain, f/u Ortho Parkinson's, symptoms improved, on Sinemet, Comtan, started Neupro 05/08/21,  f/u neurology at [facility]. double vision, chronic, saw Ophthalmology              Orthostatic hypotension, takes Midodrine.              Anemia, stable, Hgb 11.8 11/22/20,  takes Fe             Urge incontinent of urine, takes Mirabegron, needs urinate q3-4 hours to avoid bladder discomfort.              Recurrent UTI, saw Urology, placed on Estrace x2/wk, TMP 179m daily             Constipation, takes MiraLax    Past Medical History:  Diagnosis Date   Acute lower UTI 09/14/2018   Anemia 10/15/2018   2016 colonoscopy 10/14/18 wbc 5.4, Hgb 10.1, plt 184 12/01/18 wbc 4.5, Hgb 11.1, plt 188, neutrophils 63,  Na 139, K 3.7, Bun 23, creat 0.69, eGFR 83 on Fe, Hgb 11.7 12/09/18    Atrial fibrillation (  [place]) 05/21/2010   10/07/18 Na 143, K 4.0, Bun 20, creat 0.79, eGFR 72, wbc 7.1, Hgb 10.9, plt 172 10/28/18 Na 142, K 4.0, Bun 19, creat 0.77, eGFR 74 11/18/18 Na 144, K 4.1, Bun 21, creat 0.69, eGFR 83   Bilateral lower extremity edema 07/19/2018   11/02/18 BMP 2 weeks.    Cervical pain (neck) 06/20/2010   Closed fracture of part of upper end of humerus 05/01/2015   Colles' fracture of right radius 03/05/2015   Constipation 07/19/2018   DEGENERATIVE JOINT DISEASE, RIGHT HIP 02/16/2007   Dupuytren's contracture    Dysuria 09/20/2018   09/20/18 c/o got up several times last night to go urinate, burning on urination, lower abd /back discomfort, but urinary frequency, leakage are not new. UA C/S, Pyridium 179m tid x 2 days.  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/2017   S/P breast biopsy, left    two o'clock position - benign   Toxic effect of venom(989.5) 07/27/2007   Tremor, unspecified 10/19/2015   Unstable gait 02/16/2017   Vaginal atrophy 10/19/2015   Venous insufficiency    Weakness 09/12/2018   Past Surgical History:  Procedure Laterality Date   BREAST BIOPSY Left    CATARACT EXTRACTION, BILATERAL      Allergies  Allergen Reactions   Doxycycline Other (See Comments)    Unknown   Sulfonamide Derivatives Other (See Comments)    Unknown     Allergies as of 11/08/2021       Reactions   Doxycycline Other (See Comments)   Unknown   Sulfonamide Derivatives Other (See Comments)   Unknown         Medication List        Accurate as of November 08, 2021  1:03 PM. If you have any questions, ask your nurse or doctor.          acetaminophen 500 MG tablet Commonly known as: TYLENOL Take 1,000 mg by mouth at bedtime.   ARY NASAL MIST ALLERGY/SINUS 2.65 % Soln Generic drug: Saline Place 1 spray into both nostrils in the morning and at bedtime.   calcium carbonate 750 MG chewable tablet Commonly known as: TUMS EX Chew 1 tablet by mouth daily.   carbidopa-levodopa 50-200 MG tablet Commonly known as: SINEMET CR Take 1 tablet by mouth at bedtime.   carbidopa-levodopa 25-100 MG disintegrating tablet Commonly known as: PARCOPA Take 2 tablets by mouth 5 (five) times daily.   carbidopa-levodopa 25-100 MG disintegrating tablet Commonly known as: PARCOPA Take 1 tablet by mouth See admin instructions. May give one additional tablet daily in addition to scheduled dose as needed for increased symptoms related to Parkinson's. For taking at 11am if needed. Do  not take after 1130 or within one hour of next dose.   Cranberry 450 MG Tabs Take 1 tablet by mouth daily.   diclofenac Sodium 1 % Gel Commonly known as: VOLTAREN Apply 2 g topically 3 (three) times daily as needed (Neck pain).   entacapone 200 MG tablet Commonly known  as: COMTAN Take 200 mg by mouth 3 (three) times daily.   ferrous sulfate 325 (65 FE) MG tablet Take 325 mg by mouth every Monday, Wednesday, and Friday.   hydrocortisone 2.5 % rectal cream Commonly known as: ANUSOL-HC Place 1 application. rectally daily as needed for hemorrhoids.   lactose free nutrition Liqd Take 237 mLs by mouth daily.   methocarbamol 500 MG tablet Commonly known as: ROBAXIN Take 250 mg by mouth daily as needed for muscle spasms.   midodrine 5 MG tablet Commonly known as: PROAMATINE Take 5 mg by mouth in the morning, at noon, and at bedtime.   mirabegron ER 50 MG Tb24 tablet Commonly known as: MYRBETRIQ Take 50 mg by mouth daily.   Neupro 1 MG/24HR Pt24 Generic drug: Rotigotine Place 1 patch onto the skin daily.   pantoprazole 40 MG tablet Commonly known as: PROTONIX Take 40 mg by mouth daily.   polyethylene glycol 17 g packet Commonly known as: MIRALAX / GLYCOLAX Take 17 g by mouth 2 (two) times daily.   polyvinyl alcohol 1.4 % ophthalmic solution Commonly known as: LIQUIFILM TEARS Place 1 drop into both eyes 3 (three) times daily as needed for dry eyes.   rivaroxaban 20 MG Tabs tablet Commonly known as: XARELTO Take 20 mg by mouth daily with supper.   simethicone 80 MG chewable tablet Commonly known as: MYLICON Chew 80 mg by mouth with breakfast, with lunch, and with evening meal.   sodium fluoride 1.1 % Crea dental cream Commonly known as: PREVIDENT 5000 PLUS Place 1 application. onto teeth in the morning and at bedtime.   trimethoprim 100 MG tablet Commonly known as: TRIMPEX Take 100 mg by mouth at bedtime.   Vitamin D3 10 MCG (400 UNIT) Caps Take 1 capsule by  mouth daily.   zinc oxide 20 % ointment Apply 1 application. topically as needed for irritation. Apply to buttocks after every incontinent episode and for redness        Review of Systems  Constitutional:  Negative for appetite change, fatigue and fever.  HENT:  Positive for hearing loss. Negative for congestion, rhinorrhea and trouble swallowing.   Eyes:  Positive for visual disturbance.       C/o double vision  Respiratory:  Positive for shortness of breath. Negative for cough, chest tightness and wheezing.   Cardiovascular:  Positive for leg swelling.  Gastrointestinal:  Negative for abdominal pain and constipation.  Genitourinary:  Positive for frequency. Negative for dysuria, genital sores, urgency, vaginal bleeding, vaginal discharge and vaginal pain.       Bladder discomfort prior to urination after waited for over 4-5 hours.   Musculoskeletal:  Positive for arthralgias, back pain and gait problem. Negative for myalgias.       Lower back discomfort positional. Chronic shoulder pain L>R, full ROM  Skin:  Positive for rash.  Neurological:  Positive for tremors. Negative for speech difficulty, weakness and headaches.       Moves slow, fine tremor in fingers, burning sensation  in the R+ L great toes when touched by sheets, comes/goes  Psychiatric/Behavioral:  Negative for behavioral problems and sleep disturbance. The patient is nervous/anxious.        Feels anxious sometimes.     Immunization History  Administered Date(s) Administered   Influenza Split 10/20/2012   Influenza Whole 10/21/2006   Influenza, High Dose Seasonal PF 10/17/2016, 11/02/2019   Influenza-Unspecified 10/20/2013, 10/22/2017, 11/07/2020   Moderna Sars-Covid-2 Vaccination 01/22/2019, 02/19/2019, 11/29/2019, 06/19/2020   Pfizer Covid-19  Vaccine Bivalent Booster 53yr & up 10/10/2020   Pneumococcal Conjugate-13 01/31/2013   Pneumococcal Polysaccharide-23 01/20/2005, 11/24/2011   Td 01/21/2004   Tdap  03/16/2014   Zoster, Live 04/17/2009   Pertinent  Health Maintenance Due  Topic Date Due   INFLUENZA VACCINE  08/20/2021   DEXA SCAN  Completed   COLONOSCOPY (Pts 45-445yrInsurance coverage will need to be confirmed)  Discontinued      09/12/2018    8:12 AM 09/13/2018    8:30 PM 09/14/2018    8:38 AM 04/18/2021    8:05 AM 10/11/2021    8:48 AM  Fall Risk  Falls in the past year?     0  Was there an injury with Fall?     0  Fall Risk Category Calculator     0  Fall Risk Category     Low  Patient Fall Risk Level High fall risk High fall risk High fall risk Moderate fall risk Low fall risk  Patient at Risk for Falls Due to     No Fall Risks  Fall risk Follow up     Falls evaluation completed   Functional Status Survey:    There were no vitals filed for this visit. There is no height or weight on file to calculate BMI. Physical Exam Vitals and nursing note reviewed.  Constitutional:      Appearance: Normal appearance.  HENT:     Head: Normocephalic and atraumatic.     Nose: Nose normal.     Mouth/Throat:     Mouth: Mucous membranes are moist.  Eyes:     Extraocular Movements: Extraocular movements intact.     Conjunctiva/sclera: Conjunctivae normal.     Pupils: Pupils are equal, round, and reactive to light.     Comments: Chronic double vision, see Ophthalmology 09/20/20  Cardiovascular:     Rate and Rhythm: Normal rate. Rhythm irregular.     Heart sounds: No murmur heard.    Comments: DP pulses present L>R Pulmonary:     Effort: Pulmonary effort is normal.     Breath sounds: No wheezing or rales.  Abdominal:     General: Bowel sounds are normal.     Palpations: Abdomen is soft.     Tenderness: There is no abdominal tenderness.  Musculoskeletal:     Cervical back: Normal range of motion and neck supple.     Right lower leg: Edema present.     Left lower leg: Edema present.     Comments: trace edema LLE, 1+ RLE. Chronic lower back, shoulder R+L pain.   Skin:     General: Skin is warm and dry.     Findings: Erythema and rash present.     Comments: ABI showed moderate PAD. Urogenital area itching, redness, discomfort of vulva, noted white discharge in the area. The patient denied burning sensation in urethra or suprapubic pain, pressure in the lower abd bladder region prior to urination is as usual, denied vaginal itching or pain.    Neurological:     General: No focal deficit present.     Mental Status: She is alert and oriented to person, place, and time. Mental status is at baseline.     Coordination: Coordination abnormal.     Gait: Gait abnormal.     Comments: Moves slow, tremor in hands. No facial or limb weakness.   Psychiatric:        Mood and Affect: Mood normal.        Behavior: Behavior  normal.     Labs reviewed: No results for input(s): "NA", "K", "CL", "CO2", "GLUCOSE", "BUN", "CREATININE", "CALCIUM", "MG", "PHOS" in the last 8760 hours. No results for input(s): "AST", "ALT", "ALKPHOS", "BILITOT", "PROT", "ALBUMIN" in the last 8760 hours. Recent Labs    11/22/20 0000  WBC 6.0  NEUTROABS 4,620.00  HGB 11.8*  HCT 36  PLT 181   Lab Results  Component Value Date   TSH 1.30 03/29/2020   No results found for: "HGBA1C" Lab Results  Component Value Date   CHOL 185 03/24/2019   HDL 79 (A) 03/24/2019   LDLCALC 94 03/24/2019   LDLDIRECT 104.2 01/25/2013   TRIG 41 03/24/2019   CHOLHDL 2 08/29/2015    Significant Diagnostic Results in last 30 days:  No results found.  Assessment/Plan: Candidiasis of urogenital site Urogenital area itching, redness, discomfort of vulva, noted white discharge in the area. The patient denied burning sensation in urethra or suprapubic pain, pressure in the lower abd bladder region prior to urination is as usual, denied vaginal itching or pain.  Assist the patient with personal hygiene, apply 1% Hydrocortisone cream/Nystatin cream bid x 10 days. Observe.   Anxiety  chronic, situational, had prn  Hydroxyzine in the past.   PAD (peripheral artery disease) ([place]) 02/26/20 ABI R+L 0.7, moderate arterial disease, vascular specialist if desires.   GERD (gastroesophageal reflux disease) Stable, takes Protonix, Simethicone.   Edema of left lower extremity due to peripheral venous insufficiency  Off Metolazone 09/27/19. Echo EF normal in the past.   Atrial fibrillation ([facility]) heart rate is in control, on Xarelto, off Diltiazem. 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 double vision, chronic, saw Ophthalmology   Anemia  stable, Hgb 11.8 11/22/20,  takes Fe  Hypotension  takes Midodrine.   Urge incontinence of urine  takes Mirabegron, needs urinate q3-4 hours to avoid bladder discomfort.   History of recurrent UTIs saw Urology, placed on Estrace x2/wk, TMP 125m daily  Slow transit constipation Hold for loose stools.     Family/ staff Communication: plan of care reviewed with the patient, the patient's daughter HPOA, and charge nurse.   Labs/tests ordered:  none  Time spend 35 minutes.

## 2021-11-08 NOTE — Assessment & Plan Note (Signed)
Stable, takes Protonix, Simethicone.  

## 2021-11-08 NOTE — Assessment & Plan Note (Signed)
on Tylenol, Methocarbamol, shoulder pain, f/u Ortho 

## 2021-11-08 NOTE — Assessment & Plan Note (Signed)
Urogenital area itching, redness, discomfort of vulva, noted white discharge in the area. The patient denied burning sensation in urethra or suprapubic pain, pressure in the lower abd bladder region prior to urination is as usual, denied vaginal itching or pain.  Assist the patient with personal hygiene, apply 1% Hydrocortisone cream/Nystatin cream bid x 10 days. Observe.

## 2021-11-08 NOTE — Assessment & Plan Note (Signed)
chronic, situational, had prn Hydroxyzine in the past.

## 2021-11-08 NOTE — Assessment & Plan Note (Signed)
takes Midodrine 

## 2021-11-08 NOTE — Assessment & Plan Note (Signed)
Off Metolazone 09/27/19. Echo EF normal in the past.

## 2021-11-08 NOTE — Assessment & Plan Note (Signed)
heart rate is in control, on Xarelto, off Diltiazem. Hgb 11.8 11/22/20, TSH 1.30 03/29/20 

## 2021-11-08 NOTE — Assessment & Plan Note (Signed)
02/26/20 ABI R+L 0.7, moderate arterial disease, vascular specialist if desires.

## 2021-11-08 NOTE — Assessment & Plan Note (Signed)
symptoms improved, on Sinemet, Comtan, started Neupro 05/08/21,  f/u neurology at [facility]. double vision, chronic, saw Ophthalmology  

## 2021-11-11 ENCOUNTER — Encounter: Payer: Self-pay | Admitting: Nurse Practitioner

## 2021-11-11 ENCOUNTER — Non-Acute Institutional Stay (SKILLED_NURSING_FACILITY): Payer: Medicare PPO | Admitting: Nurse Practitioner

## 2021-11-11 DIAGNOSIS — D500 Iron deficiency anemia secondary to blood loss (chronic): Secondary | ICD-10-CM

## 2021-11-11 DIAGNOSIS — R600 Localized edema: Secondary | ICD-10-CM

## 2021-11-11 DIAGNOSIS — G903 Multi-system degeneration of the autonomic nervous system: Secondary | ICD-10-CM

## 2021-11-11 DIAGNOSIS — I739 Peripheral vascular disease, unspecified: Secondary | ICD-10-CM

## 2021-11-11 DIAGNOSIS — B3749 Other urogenital candidiasis: Secondary | ICD-10-CM

## 2021-11-11 DIAGNOSIS — G20C Parkinsonism, unspecified: Secondary | ICD-10-CM | POA: Diagnosis not present

## 2021-11-11 DIAGNOSIS — R1312 Dysphagia, oropharyngeal phase: Secondary | ICD-10-CM | POA: Diagnosis not present

## 2021-11-11 DIAGNOSIS — F419 Anxiety disorder, unspecified: Secondary | ICD-10-CM

## 2021-11-11 DIAGNOSIS — I480 Paroxysmal atrial fibrillation: Secondary | ICD-10-CM | POA: Diagnosis not present

## 2021-11-11 DIAGNOSIS — N3941 Urge incontinence: Secondary | ICD-10-CM | POA: Diagnosis not present

## 2021-11-11 DIAGNOSIS — M6281 Muscle weakness (generalized): Secondary | ICD-10-CM | POA: Diagnosis not present

## 2021-11-11 DIAGNOSIS — M159 Polyosteoarthritis, unspecified: Secondary | ICD-10-CM

## 2021-11-11 DIAGNOSIS — K5901 Slow transit constipation: Secondary | ICD-10-CM

## 2021-11-11 DIAGNOSIS — K219 Gastro-esophageal reflux disease without esophagitis: Secondary | ICD-10-CM

## 2021-11-11 DIAGNOSIS — Z87440 Personal history of urinary (tract) infections: Secondary | ICD-10-CM

## 2021-11-11 DIAGNOSIS — R471 Dysarthria and anarthria: Secondary | ICD-10-CM | POA: Diagnosis not present

## 2021-11-11 DIAGNOSIS — R29898 Other symptoms and signs involving the musculoskeletal system: Secondary | ICD-10-CM | POA: Diagnosis not present

## 2021-11-11 DIAGNOSIS — R293 Abnormal posture: Secondary | ICD-10-CM | POA: Diagnosis not present

## 2021-11-11 DIAGNOSIS — M2569 Stiffness of other specified joint, not elsewhere classified: Secondary | ICD-10-CM | POA: Diagnosis not present

## 2021-11-11 DIAGNOSIS — R41841 Cognitive communication deficit: Secondary | ICD-10-CM | POA: Diagnosis not present

## 2021-11-11 DIAGNOSIS — R2681 Unsteadiness on feet: Secondary | ICD-10-CM | POA: Diagnosis not present

## 2021-11-11 NOTE — Assessment & Plan Note (Signed)
on Tylenol, Methocarbamol, shoulder pain, f/u Ortho 

## 2021-11-11 NOTE — Assessment & Plan Note (Signed)
The patient has chronic edema BLE. Off Metolazone 09/27/19. Echo EF normal in the past.  

## 2021-11-11 NOTE — Assessment & Plan Note (Signed)
symptoms improved, on Sinemet, Comtan, started Neupro 05/08/21,  f/u neurology at [facility]. double vision, chronic, saw Ophthalmology  

## 2021-11-11 NOTE — Assessment & Plan Note (Signed)
12/25/20 ABI R+L 0.7, moderate arterial disease, vascular specialist if desires.  

## 2021-11-11 NOTE — Assessment & Plan Note (Signed)
saw Urology, placed on Estrace x2/wk, TMP '100mg'$  daily

## 2021-11-11 NOTE — Assessment & Plan Note (Signed)
Stable, takes MiraLax  

## 2021-11-11 NOTE — Progress Notes (Signed)
Location:   [facility] Room Number: NO/13/A Place of Service:  SNF (31) Provider: Lennie Odor Hadley Detloff NP  [name], MD  Patient Care Team: [name], MD as PCP - General (Internal Medicine) [name], [name], MD as PCP - Cardiology (Cardiology) [name], [name], DO as Consulting Physician (Neurology) Finnbar Cedillos X, NP as Nurse Practitioner (Internal Medicine)  Extended Emergency Contact Information Primary Emergency Contact: Richetta, Cubillos Mobile Phone: [phone] Relation: Daughter Interpreter needed? No Secondary Emergency Contact: [facility] Address: [address]          [place], [place] [address] [name] of [place] Phone: [phone] Mobile Phone: [phone] Relation: Son  Code Status:  DNR Goals of care: Advanced Directive information    11/08/2021    3:45 PM  Advanced Directives  Does Patient Have a Medical Advance Directive? Yes  Type of Paramedic of [place];Out of facility DNR (pink MOST or yellow form)  Does patient want to make changes to medical advance directive? No - Patient declined  Copy of [place] in Chart? Yes - validated most recent copy scanned in chart (See row information)     Chief Complaint  Patient presents with   Medical Management of Chronic Issues    Patient is here for a follow up for chronic conditions     HPI:  Pt is a 81 y.o. female seen today for medical management of chronic diseases.    Candidiasis urogenital site, better with Nystatin/hydrocortisone cream started 11/08/21  Anxiety, chronic, situational, had prn Hydroxyzine in the past.              PAD 12/25/20 ABI R+L 0.7, moderate arterial disease, vascular specialist if desires.              GERD, takes Protonix, Simethicone.              The patient has chronic edema BLE. Off Metolazone 09/27/19. Echo EF normal in the past.              Afib, heart rate is in control, on Xarelto, off Diltiazem. Hgb 11.8  11/22/20, TSH 1.30 03/29/20             OA,  on Tylenol, Methocarbamol, shoulder pain, f/u Ortho Parkinson's, symptoms improved, on Sinemet, Comtan, started Neupro 05/08/21,  f/u neurology at [facility]. double vision, chronic, saw Ophthalmology              Orthostatic hypotension, takes Midodrine.              Anemia, stable, Hgb 11.8 11/22/20,  takes Fe             Urge incontinent of urine, takes Mirabegron, needs urinate q3-4 hours to avoid bladder discomfort.              Recurrent UTI, saw Urology, placed on Estrace x2/wk, TMP 158m daily             Constipation, takes MiraLax Past Medical History:  Diagnosis Date   Acute lower UTI 09/14/2018   Anemia 10/15/2018   2016 colonoscopy 10/14/18 wbc 5.4, Hgb 10.1, plt 184 12/01/18 wbc 4.5, Hgb 11.1, plt 188, neutrophils 63,  Na 139, K 3.7, Bun 23, creat 0.69, eGFR 83 on Fe, Hgb 11.7 12/09/18    Atrial fibrillation (HCC) 05/21/2010   10/07/18 Na 143, K 4.0, Bun 20, creat 0.79, eGFR 72, wbc 7.1, Hgb 10.9, plt 172 10/28/18 Na 142, K 4.0,  Bun 19, creat 0.77, eGFR 74 11/18/18 Na 144, K 4.1, Bun 21, creat 0.69, eGFR 83   Bilateral lower extremity edema 07/19/2018   11/02/18 BMP 2 weeks.    Cervical pain (neck) 06/20/2010   Closed fracture of part of upper end of humerus 05/01/2015   Colles' fracture of right radius 03/05/2015   Constipation 07/19/2018   DEGENERATIVE JOINT DISEASE, RIGHT HIP 02/16/2007   Dupuytren's contracture    Dysuria 09/20/2018   09/20/18 c/o got up several times last night to go urinate, burning on urination, lower abd /back discomfort, but urinary frequency, leakage are not new. UA C/S, Pyridium 120m tid x 2 days.  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/2017   S/P breast biopsy, left    two o'clock position - benign   Toxic effect of venom(989.5) 07/27/2007   Tremor, unspecified 10/19/2015   Unstable gait 02/16/2017   Vaginal atrophy 10/19/2015   Venous insufficiency    Weakness 09/12/2018   Past Surgical History:  Procedure Laterality Date   BREAST BIOPSY Left    CATARACT EXTRACTION, BILATERAL      Allergies  Allergen Reactions   Doxycycline Other (See Comments)    Unknown   Sulfonamide Derivatives Other (See Comments)    Unknown     Allergies as of 11/11/2021       Reactions   Doxycycline Other (See Comments)   Unknown   Sulfonamide Derivatives Other (See Comments)   Unknown         Medication List        Accurate as of November 11, 2021 11:59 PM. If you have any questions, ask your nurse or doctor.          acetaminophen 500 MG tablet Commonly known as: TYLENOL Take 1,000 mg by mouth at bedtime.   ARY NASAL MIST ALLERGY/SINUS 2.65 % Soln Generic drug: Saline Place 1 spray into both nostrils in the morning and at bedtime.   calcium carbonate 750 MG chewable tablet Commonly known as: TUMS EX Chew 1 tablet by mouth daily.   carbidopa-levodopa 50-200 MG tablet Commonly known as: SINEMET CR Take 1 tablet by mouth at bedtime.   carbidopa-levodopa 25-100 MG disintegrating tablet Commonly known as: PARCOPA Take 2 tablets by mouth 5 (five) times daily.   carbidopa-levodopa 25-100 MG disintegrating tablet Commonly known as: PARCOPA Take 1 tablet by mouth See admin instructions. May give one additional tablet daily in addition to scheduled dose as needed for increased symptoms related to Parkinson's. For taking at 11am if needed. Do not take after 1130 or within one hour of next dose.   Cranberry 450 MG Tabs Take 1 tablet by mouth daily.   diclofenac Sodium  1 % Gel Commonly known as: VOLTAREN Apply 2 g topically 3 (three) times daily as needed (Neck pain).   entacapone 200 MG tablet Commonly known as: COMTAN Take 200 mg by mouth 3 (three) times daily.   ferrous sulfate 325 (65 FE) MG tablet Take 325 mg by mouth every Monday,  Wednesday, and Friday.   hydrocortisone 2.5 % rectal cream Commonly known as: ANUSOL-HC Place 1 application. rectally daily as needed for hemorrhoids.   lactose free nutrition Liqd Take 237 mLs by mouth daily.   methocarbamol 500 MG tablet Commonly known as: ROBAXIN Take 250 mg by mouth daily as needed for muscle spasms.   midodrine 5 MG tablet Commonly known as: PROAMATINE Take 5 mg by mouth in the morning, at noon, and at bedtime.   mirabegron ER 50 MG Tb24 tablet Commonly known as: MYRBETRIQ Take 50 mg by mouth daily.   Neupro 1 MG/24HR Pt24 Generic drug: Rotigotine Place 1 patch onto the skin daily.   pantoprazole 40 MG tablet Commonly known as: PROTONIX Take 40 mg by mouth daily.   polyethylene glycol 17 g packet Commonly known as: MIRALAX / GLYCOLAX Take 17 g by mouth 2 (two) times daily.   polyvinyl alcohol 1.4 % ophthalmic solution Commonly known as: LIQUIFILM TEARS Place 1 drop into both eyes 3 (three) times daily as needed for dry eyes.   rivaroxaban 20 MG Tabs tablet Commonly known as: XARELTO Take 20 mg by mouth daily with supper.   simethicone 80 MG chewable tablet Commonly known as: MYLICON Chew 80 mg by mouth with breakfast, with lunch, and with evening meal.   sodium fluoride 1.1 % Crea dental cream Commonly known as: PREVIDENT 5000 PLUS Place 1 application. onto teeth in the morning and at bedtime.   trimethoprim 100 MG tablet Commonly known as: TRIMPEX Take 100 mg by mouth at bedtime.   Vitamin D3 10 MCG (400 UNIT) Caps Take 1 capsule by mouth daily.   zinc oxide 20 % ointment Apply 1 application. topically as needed for irritation. Apply to buttocks after every  incontinent episode and for redness        Review of Systems  Constitutional:  Negative for appetite change, fatigue and fever.  HENT:  Positive for hearing loss. Negative for congestion, rhinorrhea and trouble swallowing.   Eyes:  Positive for visual disturbance.       C/o double vision  Respiratory:  Positive for shortness of breath. Negative for cough, chest tightness and wheezing.   Cardiovascular:  Positive for leg swelling.  Gastrointestinal:  Negative for abdominal pain and constipation.  Genitourinary:  Positive for frequency. Negative for dysuria, genital sores, urgency, vaginal bleeding, vaginal discharge and vaginal pain.       Bladder discomfort prior to urination after waited for over 4-5 hours.   Musculoskeletal:  Positive for arthralgias, back pain and gait problem. Negative for myalgias.       Lower back discomfort positional. Chronic shoulder pain L>R, full ROM  Skin:  Positive for rash.  Neurological:  Positive for tremors. Negative for speech difficulty, weakness and headaches.       Moves slow, fine tremor in fingers, burning sensation  in the R+ L great toes when touched by sheets, comes/goes  Psychiatric/Behavioral:  Negative for behavioral problems and sleep disturbance. The patient is nervous/anxious.        Feels anxious sometimes.     Immunization History  Administered Date(s) Administered   Influenza Split 10/20/2012   Influenza Whole 10/21/2006   Influenza, High Dose Seasonal PF 10/17/2016, 11/02/2019   Influenza-Unspecified 10/20/2013, 10/22/2017, 11/07/2020   Moderna Sars-Covid-2 Vaccination 01/22/2019, 02/19/2019, 11/29/2019, 06/19/2020   Pfizer Covid-19 Vaccine Bivalent Booster 42yr & up 10/10/2020   Pneumococcal Conjugate-13 01/31/2013   Pneumococcal Polysaccharide-23 01/20/2005, 11/24/2011   Td 01/21/2004   Tdap 03/16/2014  Zoster, Live 04/17/2009   Pertinent  Health Maintenance Due  Topic Date Due   INFLUENZA VACCINE  08/20/2021   DEXA  SCAN  Completed   COLONOSCOPY (Pts 45-24yr Insurance coverage will need to be confirmed)  Discontinued      09/13/2018    8:30 PM 09/14/2018    8:38 AM 04/18/2021    8:05 AM 10/11/2021    8:48 AM 11/08/2021    3:36 PM  Fall Risk  Falls in the past year?    0 1  Was there an injury with Fall?    0 0  Fall Risk Category Calculator    0 1  Fall Risk Category    Low Low  Patient Fall Risk Level High fall risk High fall risk Moderate fall risk Low fall risk Low fall risk  Patient at Risk for Falls Due to    No Fall Risks No Fall Risks  Fall risk Follow up    Falls evaluation completed Falls evaluation completed   Functional Status Survey:    Vitals:   11/11/21 1431  BP: (!) 98/52  Pulse: 88  Resp: 18  Temp: (!) 97.2 F (36.2 C)  SpO2: 97%  Weight: 127 lb 9.6 oz (57.9 kg)  Height: _0  (1.727 m)   Body mass index is 19.4 kg/m. Physical Exam Vitals and nursing note reviewed.  Constitutional:      Appearance: Normal appearance.  HENT:     Head: Normocephalic and atraumatic.     Nose: Nose normal.     Mouth/Throat:     Mouth: Mucous membranes are moist.  Eyes:     Extraocular Movements: Extraocular movements intact.     Conjunctiva/sclera: Conjunctivae normal.     Pupils: Pupils are equal, round, and reactive to light.     Comments: Chronic double vision, see Ophthalmology 09/20/20  Cardiovascular:     Rate and Rhythm: Normal rate. Rhythm irregular.     Heart sounds: No murmur heard.    Comments: DP pulses present L>R Pulmonary:     Effort: Pulmonary effort is normal.     Breath sounds: No wheezing or rales.  Abdominal:     General: Bowel sounds are normal.     Palpations: Abdomen is soft.     Tenderness: There is no abdominal tenderness.  Musculoskeletal:     Cervical back: Normal range of motion and neck supple.     Right lower leg: Edema present.     Left lower leg: Edema present.     Comments: trace edema LLE, 1+ RLE. Chronic lower back, shoulder R+L pain.    Skin:    General: Skin is warm and dry.     Findings: Erythema and rash present.     Comments: ABI showed moderate PAD. Improved urogenital area itching, redness, discomfort of vulva  Neurological:     General: No focal deficit present.     Mental Status: She is alert and oriented to person, place, and time. Mental status is at baseline.     Coordination: Coordination abnormal.     Gait: Gait abnormal.     Comments: Moves slow, tremor in hands. No facial or limb weakness.   Psychiatric:        Mood and Affect: Mood normal.        Behavior: Behavior normal.     Labs reviewed: No results for input(s): "NA", "K", "CL", "CO2", "GLUCOSE", "BUN", "CREATININE", "CALCIUM", "MG", "PHOS" in the last 8760 hours. No results for input(s): "AST", "  ALT", "ALKPHOS", "BILITOT", "PROT", "ALBUMIN" in the last 8760 hours. Recent Labs    11/22/20 0000  WBC 6.0  NEUTROABS 4,620.00  HGB 11.8*  HCT 36  PLT 181   Lab Results  Component Value Date   TSH 1.30 03/29/2020   No results found for: "HGBA1C" Lab Results  Component Value Date   CHOL 185 03/24/2019   HDL 79 (A) 03/24/2019   LDLCALC 94 03/24/2019   LDLDIRECT 104.2 01/25/2013   TRIG 41 03/24/2019   CHOLHDL 2 08/29/2015    Significant Diagnostic Results in last 30 days:  No results found.  Assessment/Plan  GERD (gastroesophageal reflux disease) Stable,  takes Protonix, Simethicone.   Bilateral lower extremity edema The patient has chronic edema BLE. Off Metolazone 09/27/19. Echo EF normal in the past.   Atrial fibrillation (HCC) heart rate is in control, on Xarelto, off Diltiazem. Hgb 11.8 11/22/20, TSH 1.30 03/29/20  Osteoarthritis involving multiple joints on both sides of body on Tylenol, Methocarbamol, shoulder pain, f/u Ortho  Parkinsonism (HCC) symptoms improved, on Sinemet, Comtan, started Neupro 05/08/21,  f/u neurology at [facility] double vision, chronic, saw Ophthalmology   Hypotension  takes Midodrine.    Anemia  stable, Hgb 11.8 11/22/20,  takes Fe  Urge incontinence of urine Urge incontinent of urine, takes Mirabegron, needs urinate q3-4 hours to avoid bladder discomfort.   History of recurrent UTIs saw Urology, placed on Estrace x2/wk, TMP 142m daily  Slow transit constipation Stable, takes MiraLax  PAD (peripheral artery disease) ([place] 12/25/20 ABI R+L 0.7, moderate arterial disease, vascular specialist if desires.   Anxiety  chronic, situational, had prn Hydroxyzine in the past.   Candidiasis of urogenital site  better with Nystatin/hydrocortisone cream started 11/08/21   Family/ staff Communication: plan of care reviewed with the patient and charge nurse.   Labs/tests ordered:  none  Time spend 35 minutes.

## 2021-11-11 NOTE — Assessment & Plan Note (Signed)
chronic, situational, had prn Hydroxyzine in the past.

## 2021-11-11 NOTE — Assessment & Plan Note (Signed)
Urge incontinent of urine, takes Mirabegron, needs urinate q3-4 hours to avoid bladder discomfort.  

## 2021-11-11 NOTE — Assessment & Plan Note (Signed)
takes Midodrine 

## 2021-11-11 NOTE — Assessment & Plan Note (Signed)
better with Nystatin/hydrocortisone cream started 11/08/21

## 2021-11-11 NOTE — Assessment & Plan Note (Signed)
stable, Hgb 11.8 11/22/20,  takes Fe 

## 2021-11-11 NOTE — Assessment & Plan Note (Signed)
heart rate is in control, on Xarelto, off Diltiazem. Hgb 11.8 11/22/20, TSH 1.30 03/29/20 

## 2021-11-11 NOTE — Assessment & Plan Note (Signed)
Stable, takes Protonix, Simethicone.  

## 2021-11-12 ENCOUNTER — Other Ambulatory Visit: Payer: Self-pay | Admitting: Nurse Practitioner

## 2021-11-12 ENCOUNTER — Encounter: Payer: Self-pay | Admitting: Nurse Practitioner

## 2021-11-12 DIAGNOSIS — M2569 Stiffness of other specified joint, not elsewhere classified: Secondary | ICD-10-CM | POA: Diagnosis not present

## 2021-11-12 DIAGNOSIS — R41841 Cognitive communication deficit: Secondary | ICD-10-CM | POA: Diagnosis not present

## 2021-11-12 DIAGNOSIS — R293 Abnormal posture: Secondary | ICD-10-CM | POA: Diagnosis not present

## 2021-11-12 DIAGNOSIS — M6281 Muscle weakness (generalized): Secondary | ICD-10-CM | POA: Diagnosis not present

## 2021-11-12 DIAGNOSIS — R29898 Other symptoms and signs involving the musculoskeletal system: Secondary | ICD-10-CM | POA: Diagnosis not present

## 2021-11-12 DIAGNOSIS — R1312 Dysphagia, oropharyngeal phase: Secondary | ICD-10-CM | POA: Diagnosis not present

## 2021-11-12 DIAGNOSIS — R471 Dysarthria and anarthria: Secondary | ICD-10-CM | POA: Diagnosis not present

## 2021-11-12 DIAGNOSIS — R2681 Unsteadiness on feet: Secondary | ICD-10-CM | POA: Diagnosis not present

## 2021-11-12 MED ORDER — NEUPRO 1 MG/24HR TD PT24: 2.0000 | MEDICATED_PATCH | TRANSDERMAL | 2 refills | Status: DC

## 2021-11-14 DIAGNOSIS — R29898 Other symptoms and signs involving the musculoskeletal system: Secondary | ICD-10-CM | POA: Diagnosis not present

## 2021-11-14 DIAGNOSIS — R293 Abnormal posture: Secondary | ICD-10-CM | POA: Diagnosis not present

## 2021-11-14 DIAGNOSIS — M6281 Muscle weakness (generalized): Secondary | ICD-10-CM | POA: Diagnosis not present

## 2021-11-14 DIAGNOSIS — R471 Dysarthria and anarthria: Secondary | ICD-10-CM | POA: Diagnosis not present

## 2021-11-14 DIAGNOSIS — M2569 Stiffness of other specified joint, not elsewhere classified: Secondary | ICD-10-CM | POA: Diagnosis not present

## 2021-11-14 DIAGNOSIS — R1312 Dysphagia, oropharyngeal phase: Secondary | ICD-10-CM | POA: Diagnosis not present

## 2021-11-14 DIAGNOSIS — R41841 Cognitive communication deficit: Secondary | ICD-10-CM | POA: Diagnosis not present

## 2021-11-14 DIAGNOSIS — R2681 Unsteadiness on feet: Secondary | ICD-10-CM | POA: Diagnosis not present

## 2021-11-18 DIAGNOSIS — R293 Abnormal posture: Secondary | ICD-10-CM | POA: Diagnosis not present

## 2021-11-18 DIAGNOSIS — M2569 Stiffness of other specified joint, not elsewhere classified: Secondary | ICD-10-CM | POA: Diagnosis not present

## 2021-11-18 DIAGNOSIS — R1312 Dysphagia, oropharyngeal phase: Secondary | ICD-10-CM | POA: Diagnosis not present

## 2021-11-18 DIAGNOSIS — R471 Dysarthria and anarthria: Secondary | ICD-10-CM | POA: Diagnosis not present

## 2021-11-18 DIAGNOSIS — R41841 Cognitive communication deficit: Secondary | ICD-10-CM | POA: Diagnosis not present

## 2021-11-18 DIAGNOSIS — M6281 Muscle weakness (generalized): Secondary | ICD-10-CM | POA: Diagnosis not present

## 2021-11-18 DIAGNOSIS — R29898 Other symptoms and signs involving the musculoskeletal system: Secondary | ICD-10-CM | POA: Diagnosis not present

## 2021-11-18 DIAGNOSIS — R2681 Unsteadiness on feet: Secondary | ICD-10-CM | POA: Diagnosis not present

## 2021-11-19 DIAGNOSIS — R471 Dysarthria and anarthria: Secondary | ICD-10-CM | POA: Diagnosis not present

## 2021-11-19 DIAGNOSIS — R293 Abnormal posture: Secondary | ICD-10-CM | POA: Diagnosis not present

## 2021-11-19 DIAGNOSIS — R41841 Cognitive communication deficit: Secondary | ICD-10-CM | POA: Diagnosis not present

## 2021-11-19 DIAGNOSIS — R2681 Unsteadiness on feet: Secondary | ICD-10-CM | POA: Diagnosis not present

## 2021-11-19 DIAGNOSIS — M6281 Muscle weakness (generalized): Secondary | ICD-10-CM | POA: Diagnosis not present

## 2021-11-19 DIAGNOSIS — R1312 Dysphagia, oropharyngeal phase: Secondary | ICD-10-CM | POA: Diagnosis not present

## 2021-11-19 DIAGNOSIS — M2569 Stiffness of other specified joint, not elsewhere classified: Secondary | ICD-10-CM | POA: Diagnosis not present

## 2021-11-19 DIAGNOSIS — R29898 Other symptoms and signs involving the musculoskeletal system: Secondary | ICD-10-CM | POA: Diagnosis not present

## 2021-11-21 DIAGNOSIS — M6281 Muscle weakness (generalized): Secondary | ICD-10-CM | POA: Diagnosis not present

## 2021-11-21 DIAGNOSIS — R293 Abnormal posture: Secondary | ICD-10-CM | POA: Diagnosis not present

## 2021-11-21 DIAGNOSIS — R29898 Other symptoms and signs involving the musculoskeletal system: Secondary | ICD-10-CM | POA: Diagnosis not present

## 2021-11-25 DIAGNOSIS — R293 Abnormal posture: Secondary | ICD-10-CM | POA: Diagnosis not present

## 2021-11-25 DIAGNOSIS — R29898 Other symptoms and signs involving the musculoskeletal system: Secondary | ICD-10-CM | POA: Diagnosis not present

## 2021-11-25 DIAGNOSIS — M6281 Muscle weakness (generalized): Secondary | ICD-10-CM | POA: Diagnosis not present

## 2021-11-26 DIAGNOSIS — M6281 Muscle weakness (generalized): Secondary | ICD-10-CM | POA: Diagnosis not present

## 2021-11-26 DIAGNOSIS — R29898 Other symptoms and signs involving the musculoskeletal system: Secondary | ICD-10-CM | POA: Diagnosis not present

## 2021-11-26 DIAGNOSIS — R293 Abnormal posture: Secondary | ICD-10-CM | POA: Diagnosis not present

## 2021-11-27 DIAGNOSIS — H524 Presbyopia: Secondary | ICD-10-CM | POA: Diagnosis not present

## 2021-11-27 DIAGNOSIS — R29898 Other symptoms and signs involving the musculoskeletal system: Secondary | ICD-10-CM | POA: Diagnosis not present

## 2021-11-27 DIAGNOSIS — H532 Diplopia: Secondary | ICD-10-CM | POA: Diagnosis not present

## 2021-11-27 DIAGNOSIS — R293 Abnormal posture: Secondary | ICD-10-CM | POA: Diagnosis not present

## 2021-11-27 DIAGNOSIS — M6281 Muscle weakness (generalized): Secondary | ICD-10-CM | POA: Diagnosis not present

## 2021-11-27 DIAGNOSIS — H02055 Trichiasis without entropian left lower eyelid: Secondary | ICD-10-CM | POA: Diagnosis not present

## 2021-11-27 DIAGNOSIS — H26491 Other secondary cataract, right eye: Secondary | ICD-10-CM | POA: Diagnosis not present

## 2021-11-27 DIAGNOSIS — H0100B Unspecified blepharitis left eye, upper and lower eyelids: Secondary | ICD-10-CM | POA: Diagnosis not present

## 2021-11-27 DIAGNOSIS — H0100A Unspecified blepharitis right eye, upper and lower eyelids: Secondary | ICD-10-CM | POA: Diagnosis not present

## 2021-12-02 DIAGNOSIS — R29898 Other symptoms and signs involving the musculoskeletal system: Secondary | ICD-10-CM | POA: Diagnosis not present

## 2021-12-02 DIAGNOSIS — M6281 Muscle weakness (generalized): Secondary | ICD-10-CM | POA: Diagnosis not present

## 2021-12-02 DIAGNOSIS — R293 Abnormal posture: Secondary | ICD-10-CM | POA: Diagnosis not present

## 2021-12-03 DIAGNOSIS — R293 Abnormal posture: Secondary | ICD-10-CM | POA: Diagnosis not present

## 2021-12-03 DIAGNOSIS — M6281 Muscle weakness (generalized): Secondary | ICD-10-CM | POA: Diagnosis not present

## 2021-12-03 DIAGNOSIS — R29898 Other symptoms and signs involving the musculoskeletal system: Secondary | ICD-10-CM | POA: Diagnosis not present

## 2021-12-08 DIAGNOSIS — R29898 Other symptoms and signs involving the musculoskeletal system: Secondary | ICD-10-CM | POA: Diagnosis not present

## 2021-12-08 DIAGNOSIS — R293 Abnormal posture: Secondary | ICD-10-CM | POA: Diagnosis not present

## 2021-12-08 DIAGNOSIS — M6281 Muscle weakness (generalized): Secondary | ICD-10-CM | POA: Diagnosis not present

## 2021-12-09 DIAGNOSIS — R293 Abnormal posture: Secondary | ICD-10-CM | POA: Diagnosis not present

## 2021-12-09 DIAGNOSIS — M6281 Muscle weakness (generalized): Secondary | ICD-10-CM | POA: Diagnosis not present

## 2021-12-09 DIAGNOSIS — R29898 Other symptoms and signs involving the musculoskeletal system: Secondary | ICD-10-CM | POA: Diagnosis not present

## 2021-12-10 DIAGNOSIS — R29898 Other symptoms and signs involving the musculoskeletal system: Secondary | ICD-10-CM | POA: Diagnosis not present

## 2021-12-10 DIAGNOSIS — M6281 Muscle weakness (generalized): Secondary | ICD-10-CM | POA: Diagnosis not present

## 2021-12-10 DIAGNOSIS — R293 Abnormal posture: Secondary | ICD-10-CM | POA: Diagnosis not present

## 2021-12-11 DIAGNOSIS — R29898 Other symptoms and signs involving the musculoskeletal system: Secondary | ICD-10-CM | POA: Diagnosis not present

## 2021-12-11 DIAGNOSIS — R293 Abnormal posture: Secondary | ICD-10-CM | POA: Diagnosis not present

## 2021-12-11 DIAGNOSIS — M6281 Muscle weakness (generalized): Secondary | ICD-10-CM | POA: Diagnosis not present

## 2021-12-17 ENCOUNTER — Non-Acute Institutional Stay (SKILLED_NURSING_FACILITY): Payer: Medicare PPO | Admitting: Family Medicine

## 2021-12-17 DIAGNOSIS — R531 Weakness: Secondary | ICD-10-CM | POA: Diagnosis not present

## 2021-12-17 DIAGNOSIS — I480 Paroxysmal atrial fibrillation: Secondary | ICD-10-CM | POA: Diagnosis not present

## 2021-12-17 DIAGNOSIS — R1310 Dysphagia, unspecified: Secondary | ICD-10-CM

## 2021-12-17 DIAGNOSIS — G20C Parkinsonism, unspecified: Secondary | ICD-10-CM | POA: Diagnosis not present

## 2021-12-17 DIAGNOSIS — G903 Multi-system degeneration of the autonomic nervous system: Secondary | ICD-10-CM

## 2021-12-17 NOTE — Progress Notes (Signed)
Provider:  Alain Honey, MD Location:      Place of Service:     PCP: [name], MD Patient Care Team: [name], MD as PCP - General (Internal Medicine) [name], [name], MD as PCP - Cardiology (Cardiology) [name], [name], DO as Consulting Physician (Neurology) Mast, Man X, NP as Nurse Practitioner (Internal Medicine)  Extended Emergency Contact Information Primary Emergency Contact: Janeann, Paisley Mobile Phone: [phone] Relation: Daughter Interpreter needed? No Secondary Emergency Contact: [facility] Address: [address]          [place], [place] [address] [name] of [place] Phone: [phone] Mobile Phone: [phone] Relation: Son  Code Status:  Goals of Care: Advanced Directive information    11/08/2021    3:45 PM  Advanced Directives  Does Patient Have a Medical Advance Directive? Yes  Type of Paramedic of [place];Out of facility DNR (pink MOST or yellow form)  Does patient want to make changes to medical advance directive? No - Patient declined  Copy of [place] in Chart? Yes - validated most recent copy scanned in chart (See row information)      No chief complaint on file.   HPI: Patient is a 81 y.o. female seen today for medical management of chronic problems including Parkinson's, paroxysmal A-fib, GERD, hypotension.  Patient developed Parkinson's in 2016.  She is followed by neurology and currently takes Sinemet and Comtan.  Symptoms are fairly well-managed.  There is no dysphagia and no hallucinations. She would like to be able to walk more with her Maanya but actually gets around mostly in her wheelchair.  She realizes the wheelchair is more for safety reasons. She has a history of A-fib she is on diltiazem for rate control as well as Xarelto for stroke prevention Associated with the Parkinson's is orthostatic hypotension and she takes midodrine for that  Past Medical History:   Diagnosis Date   Acute lower UTI 09/14/2018   Anemia 10/15/2018   2016 colonoscopy 10/14/18 wbc 5.4, Hgb 10.1, plt 184 12/01/18 wbc 4.5, Hgb 11.1, plt 188, neutrophils 63,  Na 139, K 3.7, Bun 23, creat 0.69, eGFR 83 on Fe, Hgb 11.7 12/09/18    Atrial fibrillation ([place]) 05/21/2010   10/07/18 Na 143, K 4.0, Bun 20, creat 0.79, eGFR 72, wbc 7.1, Hgb 10.9, plt 172 10/28/18 Na 142, K 4.0, Bun 19, creat 0.77, eGFR 74 11/18/18 Na 144, K 4.1, Bun 21, creat 0.69, eGFR 83   Bilateral lower extremity edema 07/19/2018   11/02/18 BMP 2 weeks.    Cervical pain (neck) 06/20/2010   Closed fracture of part of upper end of humerus 05/01/2015   Colles' fracture of right radius 03/05/2015   Constipation 07/19/2018   DEGENERATIVE JOINT DISEASE, RIGHT HIP 02/16/2007   Dupuytren's contracture    Dysuria 09/20/2018   09/20/18 c/o got up several times last night to go urinate, burning on urination, lower abd /back discomfort, but urinary frequency, leakage are not new. UA C/S, Pyridium 166m tid x 2 days.  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 Her smoking use included cigarettes. She has never  used smokeless tobacco. She reports that she does not drink alcohol and does not use drugs. Social History   Socioeconomic History   Marital status: Divorced    Spouse name: Not on file   Number of children: 2   Years of education: Not on file   Highest education level: Master's degree (e.g., MA, MS, MEng, MEd, MSW, MBA)  Occupational History   Occupation: retired    Comment: math, [employer]  Tobacco Use   Smoking status: Former    Types: Cigarettes    Quit date: 08/27/1971    Years since quitting: 50.3   Smokeless tobacco: Never  Vaping Use   Vaping Use: Never used  Substance and Sexual Activity   Alcohol use: No    Alcohol/week: 0.0 standard drinks of alcohol   Drug use: No   Sexual activity: Not Currently    Comment: 1st intercourse 81 yo-Fewer than 5 partners  Other Topics Concern   Not on file  Social History Narrative   Not on file   Social Determinants of Health   Financial Resource Strain: Not on file  Food Insecurity: Not on file  Transportation Needs: Not on file  Physical Activity: Not on file  Stress: Not on file  Social Connections: Not on file  Intimate Partner Violence: Not on file    Functional Status Survey:    Family History  Problem Relation Age of Onset   Cancer Father        ? type   Alcohol abuse Father    Cancer Mother        Pancreatic   Diabetes Mother    Heart failure Mother    Osteoarthritis Sister    Healthy Sister    Healthy Child    Breast cancer Neg Hx     Health Maintenance  Topic Date Due   Zoster Vaccines- Shingrix (1 of 2) Never done   Medicare Annual Wellness (AWV)  11/04/2018   INFLUENZA VACCINE  08/20/2021   COVID-19 Vaccine (6 - 2023-24 season) 09/20/2021   Pneumonia Vaccine 75+ Years old  Completed   DEXA SCAN  Completed   HPV VACCINES  Aged Out   COLONOSCOPY (Pts 45-66yr Insurance coverage will need to be confirmed)  Discontinued    Allergies  Allergen Reactions   Doxycycline Other (See Comments)    Unknown    Sulfonamide Derivatives Other (See Comments)    Unknown     Outpatient Encounter Medications as of 12/17/2021  Medication Sig   acetaminophen (TYLENOL) 500 MG tablet Take 1,000 mg by mouth at bedtime.   calcium carbonate (TUMS EX) 750 MG chewable tablet Chew 1 tablet by mouth daily.   carbidopa-levodopa (PARCOPA) 25-100 MG disintegrating tablet Take 2 tablets by mouth 5 (five) times daily.   carbidopa-levodopa (PARCOPA) 25-100 MG disintegrating tablet Take 1 tablet by mouth See admin instructions. May give one additional tablet daily in addition to scheduled dose  as needed for increased symptoms related to Parkinson's. For taking at 11am if needed. Do not take after 1130 or within one hour of next dose.   carbidopa-levodopa (SINEMET CR) 50-200 MG tablet Take 1 tablet by mouth at bedtime.   Cholecalciferol (VITAMIN D3) 10 MCG (400 UNIT) CAPS Take 1 capsule by mouth daily.   Cranberry 450 MG TABS Take 1 tablet by mouth daily.   diclofenac Sodium (VOLTAREN) 1 % GEL Apply 2 g topically 3 (three) times daily as needed (Neck pain).   entacapone (COMTAN) 200 MG tablet Take 200 mg by mouth 3 (three) times daily.   ferrous sulfate 325 (65 FE) MG tablet Take 325 mg by mouth every Monday, Wednesday, and Friday.   hydrocortisone (ANUSOL-HC) 2.5 % rectal cream Place 1 application. rectally daily as needed for hemorrhoids.   lactose free nutrition (BOOST) LIQD Take 237 mLs by mouth daily.   methocarbamol (ROBAXIN) 500 MG tablet Take 250 mg by mouth daily as needed for muscle spasms.   midodrine (PROAMATINE) 5 MG tablet Take 5 mg by mouth in the morning, at noon, and at bedtime.   mirabegron ER (MYRBETRIQ) 50 MG TB24 tablet Take 50 mg by mouth daily.   pantoprazole (PROTONIX) 40 MG tablet Take 40 mg by mouth daily.   polyethylene glycol (MIRALAX / GLYCOLAX) 17 g packet Take 17 g by mouth 2 (two) times daily.   polyvinyl alcohol (LIQUIFILM TEARS) 1.4 % ophthalmic solution Place 1 drop into both eyes 3  (three) times daily as needed for dry eyes.   rivaroxaban (XARELTO) 20 MG TABS tablet Take 20 mg by mouth daily with supper.   Rotigotine (NEUPRO) 1 MG/24HR PT24 Place 2 patches (2 mg total) onto the skin daily.   Saline (ARY NASAL MIST ALLERGY/SINUS) 2.65 % SOLN Place 1 spray into both nostrils in the morning and at bedtime.   simethicone (MYLICON) 80 MG chewable tablet Chew 80 mg by mouth with breakfast, with lunch, and with evening meal.   sodium fluoride (PREVIDENT 5000 PLUS) 1.1 % CREA dental cream Place 1 application. onto teeth in the morning and at bedtime.   trimethoprim (TRIMPEX) 100 MG tablet Take 100 mg by mouth at bedtime.   zinc oxide 20 % ointment Apply 1 application. topically as needed for irritation. Apply to buttocks after every incontinent episode and for redness   No facility-administered encounter medications on file as of 12/17/2021.    Review of Systems  Constitutional: Negative.   HENT: Negative.    Respiratory: Negative.    Cardiovascular: Negative.   Gastrointestinal:  Positive for constipation.  Genitourinary:  Positive for frequency.  Neurological:  Positive for tremors and weakness.  All other systems reviewed and are negative.   There were no vitals filed for this visit. There is no height or weight on file to calculate BMI. Physical Exam Vitals and nursing note reviewed.  Constitutional:      Appearance: Normal appearance.     Comments: Voice(soft) and lack of excess facial expression are present  HENT:     Head: Normocephalic.     Mouth/Throat:     Mouth: Mucous membranes are moist.     Pharynx: Oropharynx is clear.  Eyes:     Extraocular Movements: Extraocular movements intact.     Pupils: Pupils are equal, round, and reactive to light.  Cardiovascular:     Rate and Rhythm: Normal rate and regular rhythm.  Pulmonary:     Effort: Pulmonary effort is normal.  Breath sounds: Normal breath sounds.  Abdominal:     General: Bowel sounds are  normal.     Palpations: Abdomen is soft.  Musculoskeletal:     Comments: Uses wheelchair mostly for ambulation but desires to continue with Amaura whenever she has help  Neurological:     General: No focal deficit present.     Mental Status: She is alert and oriented to person, place, and time.  Psychiatric:        Behavior: Behavior normal.     Labs reviewed: Basic Metabolic Panel: No results for input(s): "NA", "K", "CL", "CO2", "GLUCOSE", "BUN", "CREATININE", "CALCIUM", "MG", "PHOS" in the last 8760 hours. Liver Function Tests: No results for input(s): "AST", "ALT", "ALKPHOS", "BILITOT", "PROT", "ALBUMIN" in the last 8760 hours. No results for input(s): "LIPASE", "AMYLASE" in the last 8760 hours. No results for input(s): "AMMONIA" in the last 8760 hours. CBC: No results for input(s): "WBC", "NEUTROABS", "HGB", "HCT", "MCV", "PLT" in the last 8760 hours. Cardiac Enzymes: No results for input(s): "CKTOTAL", "CKMB", "CKMBINDEX", "TROPONINI" in the last 8760 hours. BNP: Invalid input(s): "POCBNP" No results found for: "HGBA1C" Lab Results  Component Value Date   TSH 1.30 03/29/2020   Lab Results  Component Value Date   VITAMINB12 1,217 (H) 09/12/2018   No results found for: "FOLATE" No results found for: "IRON", "TIBC", "FERRITIN"  Imaging and Procedures obtained prior to SNF admission: No results found.  Assessment/Plan 1. Paroxysmal atrial fibrillation (HCC) Seems to be in sinus rhythm today.  She continues on Xarelto but off diltiazem  2. Dysphagia, unspecified type Diet is still regular but anticipate dysphagia in the future  3. Generalized weakness Multifactorial but patient feels like she is not getting enough exercise.  Desires to have PT work with her  4. 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  Midodrine is effective  5. Primary parkinsonism Symptoms are managed with combination Sinemet and Comtan.  Is bothered with constipation  for which MiraLAX helps but no hallucinations or    Family/ staff Communication:   Labs/tests ordered:none   Lillette Boxer. Sabra Heck, [place] [address] [place], [place] Office [phone]

## 2021-12-20 DIAGNOSIS — N3946 Mixed incontinence: Secondary | ICD-10-CM | POA: Diagnosis not present

## 2022-01-17 ENCOUNTER — Non-Acute Institutional Stay (SKILLED_NURSING_FACILITY): Payer: Medicare PPO | Admitting: Nurse Practitioner

## 2022-01-17 ENCOUNTER — Encounter: Payer: Self-pay | Admitting: Nurse Practitioner

## 2022-01-17 DIAGNOSIS — G20C Parkinsonism, unspecified: Secondary | ICD-10-CM

## 2022-01-17 DIAGNOSIS — D500 Iron deficiency anemia secondary to blood loss (chronic): Secondary | ICD-10-CM | POA: Diagnosis not present

## 2022-01-17 DIAGNOSIS — I739 Peripheral vascular disease, unspecified: Secondary | ICD-10-CM

## 2022-01-17 DIAGNOSIS — N3941 Urge incontinence: Secondary | ICD-10-CM

## 2022-01-17 DIAGNOSIS — M159 Polyosteoarthritis, unspecified: Secondary | ICD-10-CM

## 2022-01-17 DIAGNOSIS — K5901 Slow transit constipation: Secondary | ICD-10-CM

## 2022-01-17 DIAGNOSIS — G903 Multi-system degeneration of the autonomic nervous system: Secondary | ICD-10-CM

## 2022-01-17 DIAGNOSIS — N390 Urinary tract infection, site not specified: Secondary | ICD-10-CM | POA: Diagnosis not present

## 2022-01-17 DIAGNOSIS — I480 Paroxysmal atrial fibrillation: Secondary | ICD-10-CM | POA: Diagnosis not present

## 2022-01-17 DIAGNOSIS — K219 Gastro-esophageal reflux disease without esophagitis: Secondary | ICD-10-CM

## 2022-01-17 DIAGNOSIS — M150 Primary generalized (osteo)arthritis: Secondary | ICD-10-CM

## 2022-01-17 DIAGNOSIS — R600 Localized edema: Secondary | ICD-10-CM

## 2022-01-17 NOTE — Progress Notes (Unsigned)
Location:  [facility] Room Number: NO/13/A Place of Service:  SNF (31) Provider:  Yailyn Strack X, NP   Patient Care Team: [name], MD as PCP - General (Internal Medicine) [name], [name], MD as PCP - Cardiology (Cardiology) [name], [name], DO as Consulting Physician (Neurology) Zaila Crew X, NP as Nurse Practitioner (Internal Medicine)  Extended Emergency Contact Information Primary Emergency Contact: Jean, Alejos Mobile Phone: [phone] Relation: Daughter Interpreter needed? No Secondary Emergency Contact: [facility] Address: [address]          [place], [place] [address] [name] of [place] Phone: [phone] Mobile Phone: [phone] Relation: Son  Code Status:  DNR Goals of care: Advanced Directive information    01/17/2022    9:03 AM  Advanced Directives  Does Patient Have a Medical Advance Directive? Yes  Type of Advance Directive Out of facility DNR (pink MOST or yellow form);Healthcare Power of Attorney  Does patient want to make changes to medical advance directive? No - Patient declined  Copy of [place] in Chart? Yes - validated most recent copy scanned in chart (See row information)     Chief Complaint  Patient presents with   Medical Management of Chronic Issues    Patient is here for a follow up for chronic conditions    Quality Metric Gaps    Patient is due for AWV and discuss need for shingrix vaccine    HPI:  Pt is a 81 y.o. female seen today for medical management of chronic diseases.     Past Medical History:  Diagnosis Date   Acute lower UTI 09/14/2018   Anemia 10/15/2018   2016 colonoscopy 10/14/18 wbc 5.4, Hgb 10.1, plt 184 12/01/18 wbc 4.5, Hgb 11.1, plt 188, neutrophils 63,  Na 139, K 3.7, Bun 23, creat 0.69, eGFR 83 on Fe, Hgb 11.7 12/09/18    Atrial fibrillation ([place]) 05/21/2010   10/07/18 Na 143, K 4.0, Bun 20, creat 0.79, eGFR 72, wbc 7.1, Hgb 10.9, plt 172 10/28/18 Na  142, K 4.0, Bun 19, creat 0.77, eGFR 74 11/18/18 Na 144, K 4.1, Bun 21, creat 0.69, eGFR 83   Bilateral lower extremity edema 07/19/2018   11/02/18 BMP 2 weeks.    Cervical pain (neck) 06/20/2010   Closed fracture of part of upper end of humerus 05/01/2015   Colles' fracture of right radius 03/05/2015   Constipation 07/19/2018   DEGENERATIVE JOINT DISEASE, RIGHT HIP 02/16/2007   Dupuytren's contracture    Dysuria 09/20/2018   09/20/18 c/o got up several times last night to go urinate, burning on urination, lower abd /back discomfort, but urinary frequency, leakage are not new. UA C/S, Pyridium 167m tid x 2 days.  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   S/P breast biopsy, left    two o'clock position - benign   Toxic effect of venom(989.5) 07/27/2007   Tremor, unspecified 10/19/2015   Unstable gait 02/16/2017   Vaginal atrophy 10/19/2015   Venous insufficiency  Weakness 09/12/2018   Past Surgical History:  Procedure Laterality Date   BREAST BIOPSY Left    CATARACT EXTRACTION, BILATERAL      Allergies  Allergen Reactions   Doxycycline Other (See Comments)    Unknown   Sulfonamide Derivatives Other (See Comments)    Unknown     Outpatient Encounter Medications as of 01/17/2022  Medication Sig   acetaminophen (TYLENOL) 500 MG tablet Take 1,000 mg by mouth at bedtime.   calcium carbonate (TUMS EX) 750 MG chewable tablet Chew 1 tablet by mouth daily.   carbidopa-levodopa (PARCOPA) 25-100 MG disintegrating tablet Take 2 tablets by mouth 5 (five) times  daily.   carbidopa-levodopa (PARCOPA) 25-100 MG disintegrating tablet Take 1 tablet by mouth See admin instructions. May give one additional tablet daily in addition to scheduled dose as needed for increased symptoms related to Parkinson's. For taking at 11am if needed. Do not take after 1130 or within one hour of next dose.   carbidopa-levodopa (SINEMET CR) 50-200 MG tablet Take 1 tablet by mouth at bedtime.   Cholecalciferol (VITAMIN D3) 10 MCG (400 UNIT) CAPS Take 1 capsule by mouth daily.   Cranberry 450 MG TABS Take 1 tablet by mouth daily.   diclofenac Sodium (VOLTAREN) 1 % GEL Apply 2 g topically 3 (three) times daily as needed (Neck pain).   entacapone (COMTAN) 200 MG tablet Take 200 mg by mouth 3 (three) times daily.   ferrous sulfate 325 (65 FE) MG tablet Take 325 mg by mouth every Monday, Wednesday, and Friday.   hydrocortisone (ANUSOL-HC) 2.5 % rectal cream Place 1 application. rectally daily as needed for hemorrhoids.   lactose free nutrition (BOOST) LIQD Take 237 mLs by mouth daily.   methocarbamol (ROBAXIN) 500 MG tablet Take 250 mg by mouth daily as needed for muscle spasms.   midodrine (PROAMATINE) 5 MG tablet Take 5 mg by mouth in the morning, at noon, and at bedtime.   mirabegron ER (MYRBETRIQ) 50 MG TB24 tablet Take 50 mg by mouth daily.   pantoprazole (PROTONIX) 40 MG tablet Take 40 mg by mouth daily.   polyethylene glycol (MIRALAX / GLYCOLAX) 17 g packet Take 17 g by mouth 2 (two) times daily.   polyvinyl alcohol (LIQUIFILM TEARS) 1.4 % ophthalmic solution Place 1 drop into both eyes 3 (three) times daily as needed for dry eyes.   rivaroxaban (XARELTO) 20 MG TABS tablet Take 20 mg by mouth daily with supper.   Rotigotine (NEUPRO) 1 MG/24HR PT24 Place 2 patches (2 mg total) onto the skin daily.   Saline (ARY NASAL MIST ALLERGY/SINUS) 2.65 % SOLN Place 1 spray into both nostrils in the morning and at bedtime.   simethicone (MYLICON) 80 MG chewable tablet Chew 80 mg by mouth  with breakfast, with lunch, and with evening meal.   sodium fluoride (PREVIDENT 5000 PLUS) 1.1 % CREA dental cream Place 1 application. onto teeth in the morning and at bedtime.   trimethoprim (TRIMPEX) 100 MG tablet Take 100 mg by mouth at bedtime.   zinc oxide 20 % ointment Apply 1 application. topically as needed for irritation. Apply to buttocks after every incontinent episode and for redness   No facility-administered encounter medications on file as of 01/17/2022.    Review of Systems  Immunization History  Administered Date(s) Administered   Influenza Split 10/20/2012   Influenza Whole 10/21/2006   Influenza, High Dose Seasonal PF 10/17/2016, 11/02/2019, 11/12/2021   Influenza-Unspecified 10/20/2013, 10/22/2017, 11/07/2020   Moderna Sars-Covid-2 Vaccination 01/22/2019, 02/19/2019,  11/29/2019, 06/19/2020   Pfizer Covid-19 Vaccine Bivalent Booster 39yr & up 10/10/2020, 11/21/2021   Pneumococcal Conjugate-13 01/31/2013   Pneumococcal Polysaccharide-23 01/20/2005, 11/24/2011   Td 01/21/2004   Tdap 03/16/2014   Zoster, Live 04/17/2009   Pertinent  Health Maintenance Due  Topic Date Due   INFLUENZA VACCINE  Completed   DEXA SCAN  Completed   COLONOSCOPY (Pts 45-456yrInsurance coverage will need to be confirmed)  Discontinued      09/14/2018    8:38 AM 04/18/2021    8:05 AM 10/11/2021    8:48 AM 11/08/2021    3:36 PM 01/17/2022    9:02 AM  Fall Risk  Falls in the past year?   0 1 0  Was there an injury with Fall?   0 0 0  Fall Risk Category Calculator   0 1 0  Fall Risk Category   Low Low Low  Patient Fall Risk Level High fall risk Moderate fall risk Low fall risk Low fall risk Low fall risk  Patient at Risk for Falls Due to   No Fall Risks No Fall Risks No Fall Risks  Fall risk Follow up   Falls evaluation completed Falls evaluation completed Falls evaluation completed   Functional Status Survey:    Vitals:   01/17/22 0900  BP: 102/72  Pulse: 70  Weight: 131 lb  (59.4 kg)  Height: _0  (1.727 m)   Body mass index is 19.92 kg/m. Physical Exam  Labs reviewed: No results for input(s): "NA", "K", "CL", "CO2", "GLUCOSE", "BUN", "CREATININE", "CALCIUM", "MG", "PHOS" in the last 8760 hours. No results for input(s): "AST", "ALT", "ALKPHOS", "BILITOT", "PROT", "ALBUMIN" in the last 8760 hours. No results for input(s): "WBC", "NEUTROABS", "HGB", "HCT", "MCV", "PLT" in the last 8760 hours. Lab Results  Component Value Date   TSH 1.30 03/29/2020   No results found for: "HGBA1C" Lab Results  Component Value Date   CHOL 185 03/24/2019   HDL 79 (A) 03/24/2019   LDLCALC 94 03/24/2019   LDLDIRECT 104.2 01/25/2013   TRIG 41 03/24/2019   CHOLHDL 2 08/29/2015    Significant Diagnostic Results in last 30 days:  No results found.  Assessment/Plan No problem-specific Assessment & Plan notes found for this encounter.     Family/ staff Communication: plan of care reviewed with the patient and charge nurse.   Labs/tests ordered: none  Time spend 40 minutes.

## 2022-01-23 ENCOUNTER — Encounter: Payer: Self-pay | Admitting: Nurse Practitioner

## 2022-01-23 NOTE — Assessment & Plan Note (Signed)
The patient has chronic edema BLE. Off Metolazone 09/27/19. Echo EF normal in the past.  

## 2022-01-23 NOTE — Assessment & Plan Note (Signed)
Urge incontinent of urine, takes Mirabegron, needs urinate q3-4 hours to avoid bladder discomfort.

## 2022-01-23 NOTE — Assessment & Plan Note (Signed)
Stable,  takes Midodrine.

## 2022-01-23 NOTE — Assessment & Plan Note (Signed)
heart rate is in control, on Xarelto, off Diltiazem. Hgb 11.8 11/22/20, TSH 1.30 03/29/20 

## 2022-01-23 NOTE — Assessment & Plan Note (Signed)
symptoms improved, on Sinemet, Comtan, started Neupro 05/08/21,  f/u neurology at [facility], no change of double vision, chronic, saw Ophthalmology

## 2022-01-23 NOTE — Assessment & Plan Note (Signed)
stable, Hgb 11.8 11/22/20,  takes Fe 

## 2022-01-23 NOTE — Assessment & Plan Note (Signed)
Stable, takes MiraLax

## 2022-01-23 NOTE — Assessment & Plan Note (Signed)
Stable, takes Protonix, Simethicone.

## 2022-01-23 NOTE — Assessment & Plan Note (Signed)
12/25/20 ABI R+L 0.7, moderate arterial disease, vascular specialist if desires.  

## 2022-01-23 NOTE — Assessment & Plan Note (Signed)
Recurrent UTI, saw Urology, placed on Estrace x2/wk, TMP '100mg'$  daily

## 2022-01-23 NOTE — Assessment & Plan Note (Signed)
on Tylenol, Methocarbamol, shoulder pain, f/u Ortho 

## 2022-01-28 ENCOUNTER — Non-Acute Institutional Stay (SKILLED_NURSING_FACILITY): Payer: Medicare PPO | Admitting: Nurse Practitioner

## 2022-01-28 ENCOUNTER — Encounter: Payer: Self-pay | Admitting: Nurse Practitioner

## 2022-01-28 DIAGNOSIS — Z0000 Encounter for general adult medical examination without abnormal findings: Secondary | ICD-10-CM | POA: Diagnosis not present

## 2022-01-29 ENCOUNTER — Encounter: Payer: Self-pay | Admitting: Adult Health

## 2022-01-29 ENCOUNTER — Non-Acute Institutional Stay (SKILLED_NURSING_FACILITY): Payer: Medicare PPO | Admitting: Adult Health

## 2022-01-29 DIAGNOSIS — G903 Multi-system degeneration of the autonomic nervous system: Secondary | ICD-10-CM

## 2022-01-29 DIAGNOSIS — H6123 Impacted cerumen, bilateral: Secondary | ICD-10-CM | POA: Diagnosis not present

## 2022-01-29 DIAGNOSIS — G20C Parkinsonism, unspecified: Secondary | ICD-10-CM

## 2022-01-29 DIAGNOSIS — D500 Iron deficiency anemia secondary to blood loss (chronic): Secondary | ICD-10-CM | POA: Diagnosis not present

## 2022-01-29 NOTE — Progress Notes (Addendum)
Location:  [facility] Room Number: NO/13/A Place of Service:  SNF (31) Provider:  Durenda Age, DNP, FNP-BC  Patient Care Team: [name], MD as PCP - General (Internal Medicine) [name], [name], MD as PCP - Cardiology (Cardiology) [name], [name], DO as Consulting Physician (Neurology) Mast, Man X, NP as Nurse Practitioner (Internal Medicine)  Extended Emergency Contact Information Primary Emergency Contact: Jenesa, [place] Mobile Phone: [phone] Relation: Daughter Interpreter needed? No Secondary Emergency Contact: [facility] Address: [address]          [place], [place] [address] [name] of [place] Phone: [phone] Mobile Phone: [phone] Relation: Son  Code Status:  DNR  Goals of care: Advanced Directive information    01/28/2022   11:54 AM  Advanced Directives  Does Patient Have a Medical Advance Directive? Yes  Type of Advance Directive Out of facility DNR (pink MOST or yellow form);Healthcare Power of Attorney  Does patient want to make changes to medical advance directive? No - Patient declined  Copy of [place] in Chart? Yes - validated most recent copy scanned in chart (See row information)     Chief Complaint  Patient presents with   Acute Visit    Patient is being seen for left ear pain    HPI:  Pt is a 82 y.o. female seen today for an acute visit regarding left ear pain. She is a long-term care resident of [facility]. She complained of left ear pain. No noted discharge from ears. Noted bilateral ears with moderate dry cerumen. No erythema. No reported fever nor chills. She has a PMH of anemia, Parkinson's disease and hypotension.  She takes Carbidopa/Levodopa and Entacapone for Parkinson's disease.  SBPs ranging from 96 to 130. She takes Midodrine for hypotension.  She takes FeSO4 for anemia. Latest hgb 11.7, 06/18/21   Past Medical History:  Diagnosis Date    Acute lower UTI 09/14/2018   Anemia 10/15/2018   2016 colonoscopy 10/14/18 wbc 5.4, Hgb 10.1, plt 184 12/01/18 wbc 4.5, Hgb 11.1, plt 188, neutrophils 63,  Na 139, K 3.7, Bun 23, creat 0.69, eGFR 83 on Fe, Hgb 11.7 12/09/18    Atrial fibrillation ([place]) 05/21/2010   10/07/18 Na 143, K 4.0, Bun 20, creat 0.79, eGFR 72, wbc 7.1, Hgb 10.9, plt 172 10/28/18 Na 142, K 4.0, Bun 19, creat 0.77, eGFR 74 11/18/18 Na 144, K 4.1, Bun 21, creat 0.69, eGFR 83   Bilateral lower extremity edema 07/19/2018   11/02/18 BMP 2 weeks.    Cervical pain (neck) 06/20/2010   Closed fracture of part of upper end of humerus 05/01/2015   Colles' fracture of right radius 03/05/2015   Constipation 07/19/2018   DEGENERATIVE JOINT DISEASE, RIGHT HIP 02/16/2007   Dupuytren's contracture    Dysuria 09/20/2018   09/20/18 c/o got up several times last night to go urinate, burning on urination, lower abd /back discomfort, but urinary frequency, leakage are not new. UA C/S, Pyridium '100mg'$  tid x 2 days.  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   S/P breast biopsy, left    two o'clock position - benign   Toxic effect of venom(989.5) 07/27/2007   Tremor, unspecified 10/19/2015   Unstable gait 02/16/2017   Vaginal atrophy 10/19/2015   Venous insufficiency    Weakness 09/12/2018   Past Surgical History:  Procedure Laterality Date   BREAST BIOPSY Left    CATARACT EXTRACTION, BILATERAL      Allergies  Allergen Reactions   Doxycycline Other (See Comments)    Unknown   Sulfonamide Derivatives Other (See  Comments)    Unknown     Outpatient Encounter Medications as of 01/29/2022  Medication Sig   acetaminophen (TYLENOL) 500 MG tablet Take 1,000 mg by mouth at bedtime.   calcium carbonate (TUMS EX) 750 MG chewable tablet Chew 1 tablet by mouth daily.   carbidopa-levodopa (PARCOPA) 25-100 MG disintegrating tablet Take 2 tablets by mouth 5 (five) times daily.   carbidopa-levodopa (PARCOPA) 25-100 MG disintegrating tablet Take 1 tablet by mouth See admin instructions. May give one additional tablet daily in addition to scheduled dose as needed for increased symptoms related to Parkinson's. For taking at 11am if needed. Do not take after 1130 or within one hour of next dose.   carbidopa-levodopa (SINEMET CR) 50-200 MG tablet Take 1 tablet by mouth at bedtime.   Cholecalciferol (VITAMIN D3) 10 MCG (400 UNIT) CAPS Take 1 capsule by mouth daily.   Cranberry 450 MG TABS Take 1 tablet by mouth daily.   diclofenac Sodium (VOLTAREN) 1 % GEL Apply 2 g topically 3 (three) times daily as needed (Neck pain).   entacapone (COMTAN) 200 MG tablet Take 200 mg by mouth 3 (three) times daily.   ferrous sulfate 325 (65 FE) MG tablet Take 325 mg by mouth every Monday, Wednesday, and Friday.   hydrocortisone (ANUSOL-HC) 2.5 % rectal cream Place 1 application. rectally daily as needed for hemorrhoids.   lactose free nutrition (BOOST) LIQD Take 237 mLs by mouth daily.   methocarbamol (ROBAXIN) 500 MG tablet Take 250 mg by mouth daily as needed for muscle spasms.   midodrine (PROAMATINE) 5 MG tablet Take 5 mg by mouth in the morning, at noon, and at bedtime.   mirabegron ER (MYRBETRIQ) 50 MG TB24 tablet Take 50 mg by mouth daily.   pantoprazole (PROTONIX) 40 MG tablet Take 40 mg by mouth daily.   polyethylene glycol (MIRALAX / GLYCOLAX) 17 g packet Take 17 g by mouth 2 (two) times daily.   polyvinyl alcohol (LIQUIFILM TEARS) 1.4 % ophthalmic solution Place 1 drop into both eyes 3 (three) times daily as needed for dry eyes.    rivaroxaban (XARELTO) 20 MG TABS tablet Take 20 mg by mouth daily with supper.   Rotigotine (NEUPRO) 1 MG/24HR PT24 Place 2 patches (2 mg total) onto the skin daily.   Saline (ARY NASAL MIST ALLERGY/SINUS) 2.65 % SOLN Place 1 spray into both nostrils in the morning and at bedtime.   simethicone (MYLICON) 80 MG chewable tablet Chew 80 mg by mouth with breakfast, with lunch, and with evening meal.   sodium fluoride (PREVIDENT 5000 PLUS) 1.1 % CREA dental cream Place 1 application. onto teeth in the morning and at bedtime.   trimethoprim (TRIMPEX) 100 MG tablet Take 100 mg by mouth at bedtime.   zinc oxide 20 % ointment Apply 1 application. topically as needed for irritation. Apply to buttocks after every incontinent episode and for redness   No facility-administered  encounter medications on file as of 01/29/2022.    Review of Systems  Constitutional:  Negative for appetite change, chills, fatigue and fever.  HENT:  Positive for ear pain. Negative for congestion, hearing loss, rhinorrhea and sore throat.   Eyes: Negative.   Respiratory:  Negative for cough, shortness of breath and wheezing.   Cardiovascular:  Negative for chest pain, palpitations and leg swelling.  Gastrointestinal:  Negative for abdominal pain, constipation, diarrhea, nausea and vomiting.  Genitourinary:  Negative for dysuria.  Musculoskeletal:  Negative for arthralgias, back pain and myalgias.  Skin:  Negative for color change, rash and wound.  Neurological:  Negative for dizziness, weakness and headaches.  Psychiatric/Behavioral:  Negative for behavioral problems. The patient is not nervous/anxious.        Immunization History  Administered Date(s) Administered   Influenza Split 10/20/2012   Influenza Whole 10/21/2006   Influenza, High Dose Seasonal PF 10/17/2016, 11/02/2019, 11/12/2021   Influenza-Unspecified 10/20/2013, 10/22/2017, 11/07/2020   Moderna Sars-Covid-2 Vaccination 01/22/2019, 02/19/2019, 11/29/2019,  06/19/2020   Pfizer Covid-19 Vaccine Bivalent Booster 100yr & up 10/10/2020, 11/21/2021   Pneumococcal Conjugate-13 01/31/2013   Pneumococcal Polysaccharide-23 01/20/2005, 11/24/2011   Td 01/21/2004   Tdap 03/16/2014   Zoster, Live 04/17/2009   Pertinent  Health Maintenance Due  Topic Date Due   INFLUENZA VACCINE  Completed   DEXA SCAN  Completed   COLONOSCOPY (Pts 45-448yrInsurance coverage will need to be confirmed)  Discontinued      04/18/2021    8:05 AM 10/11/2021    8:48 AM 11/08/2021    3:36 PM 01/17/2022    9:02 AM 01/28/2022   11:54 AM  Fall Risk  Falls in the past year?  0 1 0 0  Was there an injury with Fall?  0 0 0 0  Fall Risk Category Calculator  0 1 0 0  Fall Risk Category  Low Low Low Low  Patient Fall Risk Level Moderate fall risk Low fall risk Low fall risk Low fall risk Low fall risk  Patient at Risk for Falls Due to  No Fall Risks No Fall Risks No Fall Risks No Fall Risks  Fall risk Follow up  Falls evaluation completed Falls evaluation completed Falls evaluation completed Falls evaluation completed     Vitals:   01/29/22 1102  BP: 130/75  Pulse: 60  Resp: 14  Temp: 97.6 F (36.4 C)  SpO2: 97%  Weight: 131 lb (59.4 kg)  Height: '5\' 8"'$  (1.727 m)   Body mass index is 19.92 kg/m.  Physical Exam Constitutional:      General: She is not in acute distress.    Appearance: Normal appearance.  HENT:     Head: Normocephalic and atraumatic.     Nose: Nose normal.     Mouth/Throat:     Mouth: Mucous membranes are moist.  Eyes:     Conjunctiva/sclera: Conjunctivae normal.  Cardiovascular:     Rate and Rhythm: Normal rate and regular rhythm.  Pulmonary:     Effort: Pulmonary effort is normal.     Breath sounds: Normal breath sounds.  Abdominal:     General: Bowel sounds are normal.     Palpations: Abdomen is soft.  Musculoskeletal:        General: Swelling present. Normal range of motion.     Cervical back: Normal range of motion.     Right lower  leg: Edema present.     Left lower leg: Edema present.     Comments: BLE  1+edema  Skin:    General: Skin is warm and dry.  Neurological:     General: No focal deficit present.     Mental Status: She is alert and oriented to person, place, and time.  Psychiatric:        Mood and Affect: Mood normal.        Behavior: Behavior normal.        Labs reviewed: Recent Labs    06/18/21 0000  NA 142  K 4.4  CL 108  CO2 27*  BUN 21  CREATININE 0.8  CALCIUM 9.3   Recent Labs    06/18/21 0000  AST 13  ALT 6*  ALKPHOS 48  ALBUMIN 4.0   Recent Labs    06/18/21 0000  WBC 4.4  NEUTROABS 2,803.00  HGB 11.7*  HCT 35*  PLT 159   Lab Results  Component Value Date   TSH 1.30 03/29/2020   No results found for: "HGBA1C" Lab Results  Component Value Date   CHOL 185 03/24/2019   HDL 79 (A) 03/24/2019   LDLCALC 94 03/24/2019   LDLDIRECT 104.2 01/25/2013   TRIG 41 03/24/2019   CHOLHDL 2 08/29/2015    Significant Diagnostic Results in last 30 days:  No results found.  Assessment/Plan  1. Bilateral impacted cerumen -  will start on Debrox otic drops instill 5 drops to bilateral ears BID X 4 days then Irrigated on 5th day  2. Primary parkinsonism -  stable, continue Carbidopa/Levodopa -  fall precautions  3. Iron deficiency anemia due to chronic blood loss Lab Results  Component Value Date   HGB 11.7 (A) 06/18/2021   -  continue FeSO4  4. Neurogenic orthostatic hypotension -  SBPs ranging from 96 to 130 -  continue Midodrine  Family/ staff Communication: Discussed plan of care with resident and charge nurse  Labs/tests ordered: None    Durenda Age, DNP, MSN, FNP-BC [facility] [phone] (Monday-Friday 8:00 a.m. - 5:00 p.m.) [phone] (after hours)

## 2022-01-30 ENCOUNTER — Encounter: Payer: Self-pay | Admitting: Nurse Practitioner

## 2022-01-30 NOTE — Progress Notes (Signed)
Subjective:   Stephanie Shaw is a 82 y.o. female who presents for Medicare Annual (Subsequent) preventive examination @ SN[facility]. Cardiac Risk Factors include: advanced age (>20mn, >>4women);sedentary lifestyle     Objective:    Today's Vitals   01/28/22 1153  BP: 130/75  Pulse: 60  Resp: 14  Temp: 97.6 F (36.4 C)  SpO2: 97%  Weight: 131 lb (59.4 kg)  Height: '5\' 8"'$  (1.727 m)   Body mass index is 19.92 kg/m.     01/28/2022   11:54 AM 01/17/2022    9:03 AM 11/08/2021    3:45 PM 10/11/2021    8:47 AM 09/10/2021   12:23 PM 07/05/2021    1:55 PM 06/13/2021   10:38 AM  Advanced Directives  Does Patient Have a Medical Advance Directive? Yes Yes Yes Yes Yes Yes Yes  Type of Advance Directive Out of facility DNR (pink MOST or yellow form);Healthcare Power of AHarley-Davidsonof facility DNR (pink MOST or yellow form);Healthcare Power of AR[place]Out of facility DNR (pink MOST or yellow form) H[place] of facility DNR (pink MOST or yellow form) [place] of facility DNR (pink MOST or yellow form) Healthcare Power of [place] Does patient want to make changes to medical advance directive? No - Patient declined No - Patient declined No - Patient declined No - Patient declined No - Patient declined No - Patient declined No - Patient declined  Copy of [place] Chart? Yes - validated most recent copy scanned in chart (See row information) Yes - validated most recent copy scanned in chart (See row information) Yes - validated most recent copy scanned in chart (See row information) Yes - validated most recent copy scanned in chart (See row information) Yes - validated most recent copy scanned in chart (See row information) Yes - validated most recent copy scanned in chart (See row information) Yes - validated most recent copy scanned in chart (See row  information)    Current Medications (verified) Outpatient Encounter Medications as of 01/28/2022  Medication Sig   acetaminophen (TYLENOL) 500 MG tablet Take 1,000 mg by mouth at bedtime.   calcium carbonate (TUMS EX) 750 MG chewable tablet Chew 1 tablet by mouth daily.   carbidopa-levodopa (PARCOPA) 25-100 MG disintegrating tablet Take 2 tablets by mouth 5 (five) times daily.   carbidopa-levodopa (PARCOPA) 25-100 MG disintegrating tablet Take 1 tablet by mouth See admin instructions. May give one additional tablet daily in addition to scheduled dose as needed for increased symptoms related to Parkinson's. For taking at 11am if needed. Do not take after 1130 or within one hour of next dose.   carbidopa-levodopa (SINEMET CR) 50-200 MG tablet Take 1 tablet by mouth at bedtime.   Cholecalciferol (VITAMIN D3) 10 MCG (400 UNIT) CAPS Take 1 capsule by mouth daily.   Cranberry 450 MG TABS Take 1 tablet by mouth daily.   diclofenac Sodium (VOLTAREN) 1 % GEL Apply 2 g topically 3 (three) times daily as needed (Neck pain).   entacapone (COMTAN) 200 MG tablet Take 200 mg by mouth 3 (three) times daily.   ferrous sulfate 325 (65 FE) MG tablet Take 325 mg by mouth every Monday, Wednesday, and Friday.   hydrocortisone (ANUSOL-HC) 2.5 % rectal cream Place 1 application. rectally daily as needed for hemorrhoids.   lactose free nutrition (BOOST) LIQD Take 237 mLs by mouth daily.   methocarbamol (  ROBAXIN) 500 MG tablet Take 250 mg by mouth daily as needed for muscle spasms.   midodrine (PROAMATINE) 5 MG tablet Take 5 mg by mouth in the morning, at noon, and at bedtime.   mirabegron ER (MYRBETRIQ) 50 MG TB24 tablet Take 50 mg by mouth daily.   pantoprazole (PROTONIX) 40 MG tablet Take 40 mg by mouth daily.   polyethylene glycol (MIRALAX / GLYCOLAX) 17 g packet Take 17 g by mouth 2 (two) times daily.   polyvinyl alcohol (LIQUIFILM TEARS) 1.4 % ophthalmic solution Place 1 drop into both eyes 3 (three) times daily as  needed for dry eyes.   rivaroxaban (XARELTO) 20 MG TABS tablet Take 20 mg by mouth daily with supper.   Rotigotine (NEUPRO) 1 MG/24HR PT24 Place 2 patches (2 mg total) onto the skin daily.   Saline (ARY NASAL MIST ALLERGY/SINUS) 2.65 % SOLN Place 1 spray into both nostrils in the morning and at bedtime.   simethicone (MYLICON) 80 MG chewable tablet Chew 80 mg by mouth with breakfast, with lunch, and with evening meal.   sodium fluoride (PREVIDENT 5000 PLUS) 1.1 % CREA dental cream Place 1 application. onto teeth in the morning and at bedtime.   trimethoprim (TRIMPEX) 100 MG tablet Take 100 mg by mouth at bedtime.   zinc oxide 20 % ointment Apply 1 application. topically as needed for irritation. Apply to buttocks after every incontinent episode and for redness   No facility-administered encounter medications on file as of 01/28/2022.    Allergies (verified) Doxycycline and Sulfonamide derivatives   History: Past Medical History:  Diagnosis Date   Acute lower UTI 09/14/2018   Anemia 10/15/2018   2016 colonoscopy 10/14/18 wbc 5.4, Hgb 10.1, plt 184 12/01/18 wbc 4.5, Hgb 11.1, plt 188, neutrophils 63,  Na 139, K 3.7, Bun 23, creat 0.69, eGFR 83 on Fe, Hgb 11.7 12/09/18    Atrial fibrillation ([place]) 05/21/2010   10/07/18 Na 143, K 4.0, Bun 20, creat 0.79, eGFR 72, wbc 7.1, Hgb 10.9, plt 172 10/28/18 Na 142, K 4.0, Bun 19, creat 0.77, eGFR 74 11/18/18 Na 144, K 4.1, Bun 21, creat 0.69, eGFR 83   Bilateral lower extremity edema 07/19/2018   11/02/18 BMP 2 weeks.    Cervical pain (neck) 06/20/2010   Closed fracture of part of upper end of humerus 05/01/2015   Colles' fracture of right radius 03/05/2015   Constipation 07/19/2018   DEGENERATIVE JOINT DISEASE, RIGHT HIP 02/16/2007   Dupuytren's contracture    Dysuria 09/20/2018   09/20/18 c/o got up several times last night to go urinate, burning on urination, lower abd /back discomfort, but urinary frequency, leakage are not new. UA C/S, Pyridium '100mg'$  tid x 2  days.  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 type   Alcohol abuse Father    Cancer Mother        Pancreatic   Diabetes Mother    Heart failure Mother    Osteoarthritis Sister    Healthy Sister    Healthy Child    Breast cancer Neg Hx    Social History   Socioeconomic History   Marital status: Divorced    Spouse name: Not on file   Number of children: 2   Years of education: Not on file   Highest education level: Master's degree (e.g., MA, MS, MEng, MEd, MSW, MBA)  Occupational History   Occupation: retired    Comment: math, [employer]  Tobacco Use   Smoking status: Former    Types: Cigarettes    Quit date: 08/27/1971    Years since quitting: 50.4   Smokeless tobacco: Never  Vaping Use   Vaping Use: Never used  Substance and Sexual Activity   Alcohol use: No    Alcohol/week: 0.0 standard drinks of alcohol   Drug use: No   Sexual activity: Not Currently    Comment: 1st intercourse 82 yo-Fewer than 5 partners  Other Topics Concern   Not on file  Social History Narrative   Not on file   Social Determinants of Health    Financial Resource Strain: Not on file  Food Insecurity: Not on file  Transportation Needs: Not on file  Physical Activity: Not on file  Stress: Not on file  Social Connections: Not on file    Tobacco Counseling Counseling given: Not Answered   Clinical Intake:  Pre-visit preparation completed: Yes  Pain : No/denies pain     BMI - recorded: 19.92 Nutritional Status: BMI of 19-24  Normal Nutritional Risks: None Diabetes: No  How often do you need to have someone help you when you read instructions, pamphlets, or other written materials from your doctor or pharmacy?: 3 - Sometimes What is the last grade level you completed in school?: college  Diabetic?no  Interpreter Needed?: No  Information entered by :: Ronna Herskowitz Bretta Bang N{   Activities of Daily Living    01/30/2022   11:24 AM  In your present state of health, do you have any difficulty performing the following activities:  Hearing? 1  Comment hearing aids.  Vision? 1  Difficulty concentrating or making decisions? 1  Walking or climbing stairs? 1  Dressing or bathing? 1  Doing errands, shopping? 1  Preparing Food and eating ? N  Using the Toilet? N  In the past six months, have you accidently leaked urine? Y  Do you have problems with loss of bowel control? N  Managing your Medications? Y  Managing your Finances? Y  Housekeeping or managing your Housekeeping? Y    Patient Care Team: [name], MD as PCP - General (Internal Medicine) [name], [name], MD as PCP - Cardiology (Cardiology) [name], [name], DO as Consulting Physician (Neurology) [place] Schnitker X, NP as Nurse Practitioner (Internal Medicine)  Indicate any recent Medical Services you may have received from other than [facility] providers in the past year (date may be approximate).     Assessment:   This is a routine wellness examination for Dailyn.  Hearing/Vision screen No results found.  Dietary issues and exercise activities  discussed: Current Exercise Habits: The patient does not participate in regular exercise at present, Exercise limited by: neurologic condition(s);orthopedic condition(s)   Goals Addressed             This Visit's Progress    Maintain Mobility and Function       Evidence-based guidance:  Acknowledge and validate impact of pain, loss of strength and potential disfigurement (hand osteoarthritis) on mental health and daily life, such as social isolation, anxiety, depression, impaired sexual relationship and   injury from falls.  Anticipate referral to physical or occupational therapy for assessment, therapeutic exercise and recommendation for adaptive equipment or assistive devices; encourage participation.  Assess impact on ability to perform activities of daily living, as well as engage in sports and leisure events or requirements of work or school.  Provide anticipatory guidance and reassurance about the benefit of exercise to maintain function; acknowledge and normalize fear that exercise may worsen symptoms.  Encourage regular exercise, at least 10 minutes at a time for 45 minutes per week; consider yoga, water exercise and proprioceptive exercises; encourage use of wearable activity tracker to increase motivation and adherence.  Encourage maintenance or resumption of daily activities, including employment, as pain allows and with minimal exposure to trauma.  Assist patient to advocate for adaptations to the work environment.  Consider level of pain and function, gender, age, lifestyle, patient preference, quality of life, readiness and ?ocapacity to benefit? when recommending patients for orthopaedic surgery consultation.  Explore strategies, such as changes to medication regimen or activity that enables patient to anticipate and manage flare-ups that increase deconditioning and disability.  Explore patient preferences; encourage exposure to a broader range of activities that have been  avoided for fear of experiencing pain.  Identify barriers to participation in therapy or exercise, such as pain with activity, anticipated or imagined pain.  Monitor postoperative joint replacement or any preexisting joint replacement for ongoing pain and loss of function; provide social support and encouragement throughout recovery.   Notes:        Depression Screen    11/03/2017    8:59 AM 08/19/2016    1:08 PM 04/29/2015   10:57 AM 03/05/2015    1:58 PM 03/16/2014    2:48 PM  PHQ 2/9 Scores  PHQ - 2 Score 0 0 0 0 0    Fall Risk    01/28/2022   11:54 AM 01/17/2022    9:02 AM 11/08/2021    3:36 PM 10/11/2021    8:48 AM 05/03/2018    9:42 AM  Fall Risk   Falls in the past year? 0 0 1 0 1  Number falls in past yr: 0 0 0 0 0  Injury with Fall? 0 0 0 0 0  Risk for fall due to : No Fall Risks No Fall Risks No Fall Risks No Fall Risks Other (Comment)  Follow up Falls evaluation completed Falls evaluation completed Falls evaluation completed Falls evaluation completed Falls evaluation completed;Education provided;Falls prevention discussed    FALL RISK PREVENTION PERTAINING TO THE HOME:  Any stairs in or around the home? Yes  If so, are there any without handrails? No  Home free of loose throw rugs in walkways, pet beds, electrical cords, etc? Yes  Adequate lighting in your home to reduce risk of falls? Yes   ASSISTIVE DEVICES UTILIZED TO PREVENT FALLS:  Life alert? No  Use of a cane, J[place] or w/c? Yes  Grab bars in the bathroom?  Yes  Shower chair or bench in shower? Yes  Elevated toilet seat or a handicapped toilet? Yes   TIMED UP AND GO:  Was the test performed? No .     Cognitive Function:        Immunizations Immunization History  Administered Date(s) Administered   Influenza Split 10/20/2012   Influenza Whole 10/21/2006   Influenza, High Dose Seasonal PF 10/17/2016, 11/02/2019, 11/12/2021   Influenza-Unspecified 10/20/2013, 10/22/2017, 11/07/2020   Moderna  Sars-Covid-2 Vaccination 01/22/2019, 02/19/2019, 11/29/2019, 06/19/2020   Pfizer Covid-19 Vaccine Bivalent Booster 67yr & up 10/10/2020, 11/21/2021   Pneumococcal Conjugate-13 01/31/2013   Pneumococcal Polysaccharide-23 01/20/2005, 11/24/2011   Td 01/21/2004   Tdap 03/16/2014   Zoster, Live 04/17/2009    TDAP status: Up to date  Flu Vaccine status: Up to date  Pneumococcal vaccine status: Up to date  Covid-19 vaccine status: Information provided on how to obtain vaccines.   Qualifies for Shingles Vaccine? Yes   Zostavax completed Yes   Shingrix Completed?: No.    Education has been provided regarding the importance of this vaccine. Patient has been advised to call insurance company to determine out of pocket expense if they have not yet received this vaccine. Advised may also receive vaccine at local pharmacy or Health Dept. Verbalized acceptance and understanding.  Screening Tests Health Maintenance  Topic Date Due   Zoster Vaccines- Shingrix (1 of 2) Never done   COVID-19 Vaccine (7 - 2023-24 season) 01/16/2022   Medicare Annual Wellness (AWV)  01/29/2023   DTaP/Tdap/Td (3 - Td or Tdap) 03/16/2024   Pneumonia Vaccine 82 Years old  Completed   INFLUENZA VACCINE  Completed   DEXA SCAN  Completed   HPV VACCINES  Aged Out   COLONOSCOPY (Pts 45-430yrInsurance coverage will need to be confirmed)  DiStellaaintenance Due  Topic Date Due   Zoster Vaccines- Shingrix (1 of 2) Never done   COVID-19 Vaccine (7 - 2023-24 season) 01/16/2022    Colorectal cancer screening: No longer required.   Mammogram status: No longer required due to aged out.  Bone Density status: Ordered 01/30/22. Pt provided with contact info and advised to call to schedule appt.  Lung Cancer Screening: (Low Dose CT Chest recommended if Age 82-80ears, 30 pack-year currently smoking OR have quit w/in 15years.) does not qualify.     Additional Screening:  Hepatitis  C Screening: does not qualify;  Vision Screening: Recommended annual ophthalmology exams for early detection of glaucoma and other disorders of the eye. Is the patient up to date with their annual eye exam?  No  Who is the provider or what is the name of the office in which the patient attends annual eye exams? HPOA will provide If pt is not established with a provider, would they like to be referred to a provider to establish care? No .   Dental Screening: Recommended annual dental exams for proper oral hygiene  Community Resource Referral / Chronic Care Management: CRR required this visit?  No   CCM required this visit?  No      Plan:    Due Eye exam, DEXA, Shingrix if HPOA desires.  Pending MMSE  I have personally reviewed and noted the following in the patient's chart:   Medical and social history Use of alcohol, tobacco or illicit drugs  Current medications and supplements including opioid prescriptions. Patient is not currently taking opioid prescriptions. Functional ability and status Nutritional status Physical activity Advanced  directives List of other physicians Hospitalizations, surgeries, and ER visits in previous 12 months Vitals Screenings to include cognitive, depression, and falls Referrals and appointments  In addition, I have reviewed and discussed with patient certain preventive protocols, quality metrics, and best practice recommendations. A written personalized care plan for preventive services as well as general preventive health recommendations were provided to patient.     Lanisa Ishler X Bowdy Bair, NP   01/30/2022

## 2022-01-31 DIAGNOSIS — N390 Urinary tract infection, site not specified: Secondary | ICD-10-CM | POA: Diagnosis not present

## 2022-02-04 ENCOUNTER — Encounter: Payer: Self-pay | Admitting: Nurse Practitioner

## 2022-02-04 ENCOUNTER — Non-Acute Institutional Stay (SKILLED_NURSING_FACILITY): Payer: Medicare PPO | Admitting: Nurse Practitioner

## 2022-02-04 DIAGNOSIS — N3941 Urge incontinence: Secondary | ICD-10-CM

## 2022-02-04 DIAGNOSIS — K219 Gastro-esophageal reflux disease without esophagitis: Secondary | ICD-10-CM | POA: Diagnosis not present

## 2022-02-04 DIAGNOSIS — K5901 Slow transit constipation: Secondary | ICD-10-CM

## 2022-02-04 DIAGNOSIS — R600 Localized edema: Secondary | ICD-10-CM

## 2022-02-04 DIAGNOSIS — G20C Parkinsonism, unspecified: Secondary | ICD-10-CM

## 2022-02-04 DIAGNOSIS — I480 Paroxysmal atrial fibrillation: Secondary | ICD-10-CM | POA: Diagnosis not present

## 2022-02-04 DIAGNOSIS — D500 Iron deficiency anemia secondary to blood loss (chronic): Secondary | ICD-10-CM | POA: Diagnosis not present

## 2022-02-04 DIAGNOSIS — N390 Urinary tract infection, site not specified: Secondary | ICD-10-CM | POA: Diagnosis not present

## 2022-02-04 DIAGNOSIS — G903 Multi-system degeneration of the autonomic nervous system: Secondary | ICD-10-CM

## 2022-02-04 DIAGNOSIS — M159 Polyosteoarthritis, unspecified: Secondary | ICD-10-CM | POA: Diagnosis not present

## 2022-02-04 NOTE — Assessment & Plan Note (Signed)
Stable, takes MiraLax

## 2022-02-04 NOTE — Assessment & Plan Note (Signed)
Hgb 11.8 11/22/20,  takes Fe

## 2022-02-04 NOTE — Assessment & Plan Note (Signed)
Managed, takes Midodrine.

## 2022-02-04 NOTE — Progress Notes (Addendum)
Location:  [facility] Room Number: 13-A Place of Service:  SNF [phone]) Provider:  [place] Lions, MD  Patient Care Team: [name], MD as PCP - General (Internal Medicine) [name], [name], MD as PCP - Cardiology (Cardiology) [name], [name], DO as Consulting Physician (Neurology) Kaion Tisdale X, NP as Nurse Practitioner (Internal Medicine)  Extended Emergency Contact Information Primary Emergency Contact: Eulonda, Andalon Mobile Phone: [phone] Relation: Daughter Interpreter needed? No Secondary Emergency Contact: [facility] Address: [address]          [place], [place] [address] [name] of [place] Phone: [phone] Mobile Phone: [phone] Relation: Son  Code Status:  DNR Goals of care: Advanced Directive information    02/04/2022   11:50 AM  Advanced Directives  Does Patient Have a Medical Advance Directive? Yes  Type of Paramedic of [place];Out of facility DNR (pink MOST or yellow form)  Does patient want to make changes to medical advance directive? No - Patient declined  Copy of [place] in Chart? Yes - validated most recent copy scanned in chart (See row information)     Chief Complaint  Patient presents with   Acute Visit    UTI    HPI:  Pt is a 82 y.o. female seen today for an acute visit for burning sensation upon urination, Hx of UTI, on suppression therapy-Trimethoprim.     Anxiety, chronic, situational             PAD 12/25/20 ABI R+L 0.7, moderate arterial disease, vascular specialist if desires.              GERD, takes Protonix, Simethicone.              The patient has chronic edema BLE. Off Metolazone 09/27/19. Echo EF normal in the past.              Afib, heart rate is in control, on Xarelto, off Diltiazem. Hgb 11.8 11/22/20, TSH 1.30 03/29/20             OA,  on Tylenol, Methocarbamol, shoulder pain, f/u Ortho Parkinson's, symptoms improved,  on Sinemet, Comtan, started Neupro 05/08/21,  f/u neurology at [facility]. double vision, chronic, saw Ophthalmology              Orthostatic hypotension, takes Midodrine.              Anemia, stable, Hgb 11.8 11/22/20,  takes Fe             Urge incontinent of urine, takes Mirabegron, needs urinate q3-4 hours to avoid bladder discomfort.              Recurrent UTI, saw Urology, placed on Estrace x2/wk, TMP '100mg'$  daily             Constipation, takes MiraLax Past Medical History:  Diagnosis Date   Acute lower UTI 09/14/2018   Anemia 10/15/2018   2016 colonoscopy 10/14/18 wbc 5.4, Hgb 10.1, plt 184 12/01/18 wbc 4.5, Hgb 11.1, plt 188, neutrophils 63,  Na 139, K 3.7, Bun 23, creat 0.69, eGFR 83 on Fe, Hgb 11.7 12/09/18    Atrial fibrillation (HCC) 05/21/2010   10/07/18 Na 143, K 4.0, Bun 20, creat 0.79, eGFR 72, wbc 7.1, Hgb 10.9, plt 172 10/28/18 Na 142, K 4.0, Bun 19, creat 0.77, eGFR 74 11/18/18 Na 144, K 4.1, Bun 21, creat 0.69, eGFR 83   Bilateral lower  extremity edema 07/19/2018   11/02/18 BMP 2 weeks.    Cervical pain (neck) 06/20/2010   Closed fracture of part of upper end of humerus 05/01/2015   Colles' fracture of right radius 03/05/2015   Constipation 07/19/2018   DEGENERATIVE JOINT DISEASE, RIGHT HIP 02/16/2007   Dupuytren's contracture    Dysuria 09/20/2018   09/20/18 c/o got up several times last night to go urinate, burning on urination, lower abd /back discomfort, but urinary frequency, leakage are not new. UA C/S, Pyridium '100mg'$  tid x 2 days.  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/2017   S/P breast biopsy, left    two o'clock position - benign   Toxic effect of venom(989.5) 07/27/2007   Tremor, unspecified 10/19/2015   Unstable gait 02/16/2017   Vaginal atrophy 10/19/2015   Venous insufficiency    Weakness 09/12/2018   Past Surgical History:  Procedure Laterality Date   BREAST BIOPSY Left    CATARACT EXTRACTION, BILATERAL      Allergies  Allergen Reactions   Doxycycline Other (See Comments)    Unknown   Sulfonamide Derivatives Other (See Comments)    Unknown     Outpatient Encounter Medications as of 02/04/2022  Medication Sig   acetaminophen (TYLENOL) 500 MG tablet Take 1,000 mg by mouth at bedtime. Give 2 tablet by mouth as needed for PAIN related to Polyosteoarthritis, unspecified (M15.9) TID PRN, NOT TO EXCEED 3,000 MG IN 24 HRS, SCHEDULED PLUS PRN DOSES   AMOXICILLIN-POT CLAVULANATE PO Take by mouth. Give 1 tablet by mouth every 12 hours related to URINARY TRACT INFECTION, SITE NOT SPECIFIED (N39.0) for 7 Days   calcium carbonate (TUMS EX) 750 MG chewable tablet Chew 1 tablet by mouth daily.   carbidopa-levodopa (PARCOPA) 25-100 MG disintegrating tablet Take 2 tablets by mouth 5 (five) times daily.   carbidopa-levodopa (PARCOPA) 25-100 MG disintegrating tablet Take 1 tablet by mouth See admin instructions. May give one additional tablet daily in addition to scheduled dose as needed for increased symptoms related to Parkinson's. For taking at 11am if needed. Do not take after 1130 or within one hour of next dose.   carbidopa-levodopa (SINEMET CR) 50-200 MG tablet Take 1 tablet by mouth at bedtime.   Cholecalciferol (VITAMIN D3) 10 MCG (400 UNIT) CAPS Take 1 capsule by mouth daily.   Cranberry 450 MG TABS Take 1 tablet by mouth daily.   diclofenac Sodium (VOLTAREN) 1 % GEL Apply 2 g topically 3 (three) times daily as needed (Neck pain).   entacapone (COMTAN) 200 MG tablet Take 200 mg by mouth 3 (three) times daily.   ferrous  sulfate 325 (65 FE) MG tablet Take 325 mg by mouth every Monday, Wednesday, and Friday.   hydrocortisone (ANUSOL-HC) 2.5 % rectal cream Place 1 application. rectally daily as needed for hemorrhoids.   lactose free nutrition (BOOST) LIQD Take 237 mLs by mouth daily.   methocarbamol (ROBAXIN) 500 MG tablet Take 250 mg by mouth daily as needed for muscle spasms.   midodrine (PROAMATINE) 5 MG  tablet Take 5 mg by mouth in the morning, at noon, and at bedtime.   mirabegron ER (MYRBETRIQ) 50 MG TB24 tablet Take 50 mg by mouth daily.   nitrofurantoin (MACRODANTIN) 100 MG capsule Take 100 mg by mouth daily. related to URINARY TRACT INFECTION   pantoprazole (PROTONIX) 40 MG tablet Take 40 mg by mouth daily.   polyethylene glycol (MIRALAX / GLYCOLAX) 17 g packet Take 17 g by mouth 2 (two) times daily.   polyvinyl alcohol (LIQUIFILM TEARS) 1.4 % ophthalmic solution Place 1 drop into both eyes 3 (three) times daily as needed for dry eyes.   rivaroxaban (XARELTO) 20 MG TABS tablet Take 20 mg by mouth daily with supper.   Rotigotine (NEUPRO) 1 MG/24HR PT24 Place 2 patches (2 mg total) onto the skin daily.   Saline (ARY NASAL MIST ALLERGY/SINUS) 2.65 % SOLN Place 1 spray into both nostrils in the morning and at bedtime.   simethicone (MYLICON) 80 MG chewable tablet Chew 80 mg by mouth with breakfast, with lunch, and with evening meal.   sodium fluoride (PREVIDENT 5000 PLUS) 1.1 % CREA dental cream Place 1 application. onto teeth in the morning and at bedtime.   zinc oxide 20 % ointment Apply 1 application. topically as needed for irritation. Apply to buttocks after every incontinent episode and for redness   trimethoprim (TRIMPEX) 100 MG tablet Take 100 mg by mouth at bedtime.   No facility-administered encounter medications on file as of 02/04/2022.    Review of Systems  Constitutional:  Negative for appetite change, fatigue and fever.  HENT:  Positive for hearing loss. Negative for congestion, rhinorrhea and  trouble swallowing.   Eyes:  Positive for visual disturbance.       C/o double vision  Respiratory:  Positive for shortness of breath. Negative for cough, chest tightness and wheezing.   Cardiovascular:  Positive for leg swelling.  Gastrointestinal:  Negative for abdominal pain and constipation.  Genitourinary:  Positive for dysuria and frequency. Negative for genital sores and urgency.       Bladder discomfort prior to urination after waited for over 4-5 hours.   Musculoskeletal:  Positive for arthralgias, back pain and gait problem. Negative for myalgias.       Lower back discomfort positional. Chronic shoulder pain L>R, full ROM  Skin:        Bruise RLE  Neurological:  Positive for tremors. Negative for speech difficulty, weakness and headaches.       Moves slow, fine tremor in fingers, burning sensation  in the R+ L great toes when touched by sheets, comes/goes  Psychiatric/Behavioral:  Negative for behavioral problems and sleep disturbance. The patient is nervous/anxious.        Feels anxious sometimes.     Immunization History  Administered Date(s) Administered   Influenza Split 10/20/2012   Influenza Whole 10/21/2006   Influenza, High Dose Seasonal PF 10/17/2016, 11/02/2019, 11/12/2021   Influenza-Unspecified 10/20/2013, 10/22/2017, 11/07/2020   Moderna Sars-Covid-2 Vaccination 01/22/2019, 02/19/2019, 11/29/2019, 06/19/2020   Pfizer Covid-19 Vaccine Bivalent Booster 42yr & up 10/10/2020, 11/21/2021   Pneumococcal Conjugate-13 01/31/2013   Pneumococcal Polysaccharide-23 01/20/2005, 11/24/2011   Td 01/21/2004   Tdap 03/16/2014   Zoster, Live 04/17/2009   Pertinent  Health Maintenance Due  Topic Date Due   INFLUENZA VACCINE  Completed   DEXA SCAN  Completed   COLONOSCOPY (Pts 45-465yrInsurance coverage will need to be confirmed)  Discontinued      04/18/2021    8:05 AM 10/11/2021  8:48 AM 11/08/2021    3:36 PM 01/17/2022    9:02 AM 01/28/2022   11:54 AM  Fall Risk   Falls in the past year?  0 1 0 0  Was there an injury with Fall?  0 0 0 0  Fall Risk Category Calculator  0 1 0 0  Fall Risk Category (Retired)  Low Low Low Low  (RETIRED) Patient Fall Risk Level Moderate fall risk Low fall risk Low fall risk Low fall risk Low fall risk  Patient at Risk for Falls Due to  No Fall Risks No Fall Risks No Fall Risks No Fall Risks  Fall risk Follow up  Falls evaluation completed Falls evaluation completed Falls evaluation completed Falls evaluation completed   Functional Status Survey:    Vitals:   02/04/22 1135  BP: 130/75  Pulse: 60  Resp: 14  Temp: 97.6 F (36.4 C)  SpO2: 97%  Weight: 131 lb 1.6 oz (59.5 kg)  Height: '5\' 8"'$  (1.727 m)   Body mass index is 19.93 kg/m. Physical Exam Vitals and nursing note reviewed.  Constitutional:      Appearance: Normal appearance.  HENT:     Head: Normocephalic and atraumatic.     Nose: Nose normal.     Mouth/Throat:     Mouth: Mucous membranes are moist.  Eyes:     Extraocular Movements: Extraocular movements intact.     Conjunctiva/sclera: Conjunctivae normal.     Pupils: Pupils are equal, round, and reactive to light.     Comments: Chronic double vision, see Ophthalmology 09/20/20  Cardiovascular:     Rate and Rhythm: Normal rate. Rhythm irregular.     Heart sounds: No murmur heard.    Comments: DP pulses present L>R Pulmonary:     Effort: Pulmonary effort is normal.     Breath sounds: No rales.  Abdominal:     General: Bowel sounds are normal.     Palpations: Abdomen is soft.     Tenderness: There is no abdominal tenderness.  Musculoskeletal:     Cervical back: Normal range of motion and neck supple.     Right lower leg: Edema present.     Left lower leg: Edema present.     Comments: trace edema LLE, 1+ RLE. Chronic lower back, shoulder R+L pain.   Skin:    General: Skin is warm and dry.     Findings: Bruising present.     Comments: ABI showed moderate PAD. RLE bruise, no open wound.    Neurological:     General: No focal deficit present.     Mental Status: She is alert and oriented to person, place, and time. Mental status is at baseline.     Coordination: Coordination abnormal.     Gait: Gait abnormal.     Comments: Moves slow, tremor in hands. No facial or limb weakness.   Psychiatric:        Mood and Affect: Mood normal.        Behavior: Behavior normal.     Labs reviewed: Recent Labs    06/18/21 0000  NA 142  K 4.4  CL 108  CO2 27*  BUN 21  CREATININE 0.8  CALCIUM 9.3   Recent Labs    06/18/21 0000  AST 13  ALT 6*  ALKPHOS 48  ALBUMIN 4.0   Recent Labs    06/18/21 0000  WBC 4.4  NEUTROABS 2,803.00  HGB 11.7*  HCT 35*  PLT 159   Lab Results  Component Value Date   TSH 1.30 03/29/2020   No results found for: "HGBA1C" Lab Results  Component Value Date   CHOL 185 03/24/2019   HDL 79 (A) 03/24/2019   LDLCALC 94 03/24/2019   LDLDIRECT 104.2 01/25/2013   TRIG 41 03/24/2019   CHOLHDL 2 08/29/2015    Significant Diagnostic Results in last 30 days:  No results found.  Assessment/Plan Chronic UTI 01/31/22 urine culture Klebsiella pneumoniae >100,000c/ml, Augmentin 875/'125mg'$  q12hr   GERD (gastroesophageal reflux disease) Stable,  takes Protonix, Simethicone.   Bilateral lower extremity edema  The patient has chronic edema BLE. Off Metolazone 09/27/19. Echo EF normal in the past.   Atrial fibrillation (HCC) heart rate is in control, on Xarelto, off Diltiazem. Hgb 11.8 11/22/20, TSH 1.30 03/29/20  Osteoarthritis, multiple sites  on Tylenol, Methocarbamol, shoulder pain, f/u Ortho  Parkinsonism symptoms improved, on Sinemet, Comtan, started Neupro 05/08/21,  f/u neurology at [facility]. double vision, chronic, saw Ophthalmology   Hypotension Managed, takes Midodrine.   Anemia  Hgb 11.8 11/22/20,  takes Fe  Urge incontinence of urine  Urge incontinent of urine, takes Mirabegron, needs urinate q3-4 hours to avoid bladder  discomfort.   Slow transit constipation Stable, takes Tax adviser Communication: plan of care reviewed with the patient and charge nurse.   Labs/tests ordered:  none  Time spend 35 minutes.

## 2022-02-04 NOTE — Assessment & Plan Note (Signed)
Stable,  takes Protonix, Simethicone.

## 2022-02-04 NOTE — Assessment & Plan Note (Signed)
01/31/22 urine culture Klebsiella pneumoniae >100,000c/ml, Augmentin 875/'125mg'$  q12hr

## 2022-02-04 NOTE — Assessment & Plan Note (Signed)
The patient has chronic edema BLE. Off Metolazone 09/27/19. Echo EF normal in the past.  

## 2022-02-04 NOTE — Assessment & Plan Note (Signed)
on Tylenol, Methocarbamol, shoulder pain, f/u Ortho 

## 2022-02-04 NOTE — Assessment & Plan Note (Signed)
heart rate is in control, on Xarelto, off Diltiazem. Hgb 11.8 11/22/20, TSH 1.30 03/29/20 

## 2022-02-04 NOTE — Assessment & Plan Note (Signed)
Urge incontinent of urine, takes Mirabegron, needs urinate q3-4 hours to avoid bladder discomfort.

## 2022-02-04 NOTE — Assessment & Plan Note (Signed)
symptoms improved, on Sinemet, Comtan, started Neupro 05/08/21,  f/u neurology at [facility]. double vision, chronic, saw Ophthalmology  

## 2022-02-06 ENCOUNTER — Encounter: Payer: Self-pay | Admitting: Nurse Practitioner

## 2022-02-06 ENCOUNTER — Non-Acute Institutional Stay (SKILLED_NURSING_FACILITY): Payer: Medicare PPO | Admitting: Nurse Practitioner

## 2022-02-06 DIAGNOSIS — B3731 Acute candidiasis of vulva and vagina: Secondary | ICD-10-CM | POA: Insufficient documentation

## 2022-02-06 DIAGNOSIS — K219 Gastro-esophageal reflux disease without esophagitis: Secondary | ICD-10-CM

## 2022-02-06 DIAGNOSIS — M159 Polyosteoarthritis, unspecified: Secondary | ICD-10-CM

## 2022-02-06 DIAGNOSIS — F419 Anxiety disorder, unspecified: Secondary | ICD-10-CM

## 2022-02-06 DIAGNOSIS — N3941 Urge incontinence: Secondary | ICD-10-CM

## 2022-02-06 DIAGNOSIS — G20C Parkinsonism, unspecified: Secondary | ICD-10-CM

## 2022-02-06 DIAGNOSIS — M150 Primary generalized (osteo)arthritis: Secondary | ICD-10-CM

## 2022-02-06 DIAGNOSIS — I480 Paroxysmal atrial fibrillation: Secondary | ICD-10-CM

## 2022-02-06 DIAGNOSIS — I739 Peripheral vascular disease, unspecified: Secondary | ICD-10-CM

## 2022-02-06 DIAGNOSIS — R600 Localized edema: Secondary | ICD-10-CM | POA: Diagnosis not present

## 2022-02-06 DIAGNOSIS — N390 Urinary tract infection, site not specified: Secondary | ICD-10-CM | POA: Diagnosis not present

## 2022-02-06 DIAGNOSIS — G903 Multi-system degeneration of the autonomic nervous system: Secondary | ICD-10-CM

## 2022-02-06 DIAGNOSIS — D500 Iron deficiency anemia secondary to blood loss (chronic): Secondary | ICD-10-CM

## 2022-02-06 DIAGNOSIS — K5901 Slow transit constipation: Secondary | ICD-10-CM

## 2022-02-06 NOTE — Assessment & Plan Note (Signed)
The patient has chronic edema BLE. Off Metolazone 09/27/19. Echo EF normal in the past.  

## 2022-02-06 NOTE — Assessment & Plan Note (Signed)
on Tylenol, Methocarbamol, shoulder pain, f/u Ortho 

## 2022-02-06 NOTE — Assessment & Plan Note (Signed)
Managed, takes Midodrine

## 2022-02-06 NOTE — Progress Notes (Signed)
Location:  [facility] Room Number: NO/03/A Place of Service:  SNF (31) Provider:  Teila Skalsky X, NP  Patient Care Team: [name], MD as PCP - General (Internal Medicine) [name], [name], MD as PCP - Cardiology (Cardiology) [name], [name], DO as Consulting Physician (Neurology) Ori Kreiter X, NP as Nurse Practitioner (Internal Medicine)  Extended Emergency Contact Information Primary Emergency Contact: Dellie, Piasecki Mobile Phone: [phone] Relation: Daughter Interpreter needed? No Secondary Emergency Contact: [facility] Address: [address]          [place], [place] [address] [name] of [place] Phone: [phone] Mobile Phone: [phone] Relation: Son  Code Status:  DNR Goals of care: Advanced Directive information    02/04/2022   11:50 AM  Advanced Directives  Does Patient Have a Medical Advance Directive? Yes  Type of Paramedic of [place];Out of facility DNR (pink MOST or yellow form)  Does patient want to make changes to medical advance directive? No - Patient declined  Copy of [place] in Chart? Yes - validated most recent copy scanned in chart (See row information)     Chief Complaint  Patient presents with   Acute Visit    Patient is being seen for sore urogenital area    HPI:  Pt is a 82 y.o. female seen today for an acute visit for c/o sore in the urogenital and perineal area, noted excoriated skin in the area.    UTI, 01/31/22 urine culture Klebsiella pneumoniae >100,000c/ml, Augmentin 875/'125mg'$  q12hr x7 days   Anxiety, chronic, situational             PAD 12/25/20 ABI R+L 0.7, moderate arterial disease, vascular specialist if desires.              GERD, takes Protonix, Simethicone.              The patient has chronic edema BLE. Off Metolazone 09/27/19. Echo EF normal in the past.              Afib, heart rate is in control, on Xarelto, off Diltiazem. Hgb 11.8 11/22/20,  TSH 1.30 03/29/20             OA,  on Tylenol, Methocarbamol, shoulder pain, f/u Ortho Parkinson's, symptoms improved, on Sinemet, Comtan, started Neupro 05/08/21,  f/u neurology at [facility]. double vision, chronic, saw Ophthalmology              Orthostatic hypotension, takes Midodrine.              Anemia, stable, Hgb 11.8 11/22/20,  takes Fe             Urge incontinent of urine, takes Mirabegron, needs urinate q3-4 hours to avoid bladder discomfort.              Recurrent UTI, saw Urology, placed on Estrace x2/wk, TMP '100mg'$  daily             Constipation, takes MiraLax  Past Medical History:  Diagnosis Date   Acute lower UTI 09/14/2018   Anemia 10/15/2018   2016 colonoscopy 10/14/18 wbc 5.4, Hgb 10.1, plt 184 12/01/18 wbc 4.5, Hgb 11.1, plt 188, neutrophils 63,  Na 139, K 3.7, Bun 23, creat 0.69, eGFR 83 on Fe, Hgb 11.7 12/09/18    Atrial fibrillation ([place]) 05/21/2010   10/07/18 Na 143, K 4.0, Bun 20, creat 0.79, eGFR 72, wbc 7.1, Hgb 10.9, plt 172 10/28/18 Na 142, K  4.0, Bun 19, creat 0.77, eGFR 74 11/18/18 Na 144, K 4.1, Bun 21, creat 0.69, eGFR 83   Bilateral lower extremity edema 07/19/2018   11/02/18 BMP 2 weeks.    Cervical pain (neck) 06/20/2010   Closed fracture of part of upper end of humerus 05/01/2015   Colles' fracture of right radius 03/05/2015   Constipation 07/19/2018   DEGENERATIVE JOINT DISEASE, RIGHT HIP 02/16/2007   Dupuytren's contracture    Dysuria 09/20/2018   09/20/18 c/o got up several times last night to go urinate, burning on urination, lower abd /back discomfort, but urinary frequency, leakage are not new. UA C/S, Pyridium '100mg'$  tid x 2 days.  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/2017   S/P breast biopsy, left    two o'clock position - benign   Toxic effect of venom(989.5) 07/27/2007   Tremor, unspecified 10/19/2015   Unstable gait 02/16/2017   Vaginal atrophy 10/19/2015   Venous insufficiency    Weakness 09/12/2018   Past Surgical History:  Procedure Laterality Date   BREAST BIOPSY Left    CATARACT EXTRACTION, BILATERAL      Allergies  Allergen Reactions   Doxycycline Other (See Comments)    Unknown   Sulfonamide Derivatives Other (See Comments)    Unknown     Outpatient Encounter Medications as of 02/06/2022  Medication Sig   acetaminophen (TYLENOL) 500 MG tablet Take 1,000 mg by mouth at bedtime. Give 2 tablet by mouth as needed for PAIN related to Polyosteoarthritis, unspecified (M15.9) TID PRN, NOT TO EXCEED 3,000 MG IN 24 HRS, SCHEDULED PLUS PRN DOSES   AMOXICILLIN-POT CLAVULANATE PO Take by mouth. Give 1 tablet by mouth every 12 hours related to URINARY TRACT INFECTION, SITE NOT SPECIFIED (N39.0) for 7 Days   calcium carbonate (TUMS EX) 750 MG chewable tablet Chew 1 tablet by mouth daily.   carbidopa-levodopa (PARCOPA) 25-100 MG disintegrating tablet Take 2 tablets by mouth 5 (five) times daily.   carbidopa-levodopa (PARCOPA) 25-100 MG disintegrating tablet Take 1 tablet by mouth See admin instructions. May give one additional tablet daily in addition to scheduled dose as needed for increased symptoms related to Parkinson's. For taking at 11am if needed. Do not take after 1130 or within one hour of next dose.   carbidopa-levodopa (SINEMET CR) 50-200 MG tablet Take 1 tablet by mouth at bedtime.   Cholecalciferol (VITAMIN D3) 10 MCG (400 UNIT) CAPS Take 1 capsule by mouth daily.   Cranberry 450 MG TABS Take 1 tablet by mouth daily.   diclofenac Sodium (VOLTAREN) 1 % GEL Apply 2 g topically 3 (three) times daily  as needed (Neck pain).   entacapone (COMTAN) 200 MG tablet Take 200 mg by mouth 3 (three) times daily.   ferrous sulfate 325 (65 FE) MG tablet Take 325 mg by mouth every Monday, Wednesday, and Friday.   hydrocortisone (ANUSOL-HC) 2.5 % rectal cream Place 1 application. rectally daily as needed for hemorrhoids.   lactose free nutrition (BOOST) LIQD Take 237 mLs by mouth daily.  methocarbamol (ROBAXIN) 500 MG tablet Take 250 mg by mouth daily as needed for muscle spasms.   midodrine (PROAMATINE) 5 MG tablet Take 5 mg by mouth in the morning, at noon, and at bedtime.   mirabegron ER (MYRBETRIQ) 50 MG TB24 tablet Take 50 mg by mouth daily.   nitrofurantoin (MACRODANTIN) 100 MG capsule Take 100 mg by mouth daily. related to URINARY TRACT INFECTION   pantoprazole (PROTONIX) 40 MG tablet Take 40 mg by mouth daily.   polyethylene glycol (MIRALAX / GLYCOLAX) 17 g packet Take 17 g by mouth 2 (two) times daily.   polyvinyl alcohol (LIQUIFILM TEARS) 1.4 % ophthalmic solution Place 1 drop into both eyes 3 (three) times daily as needed for dry eyes.   rivaroxaban (XARELTO) 20 MG TABS tablet Take 20 mg by mouth daily with supper.   Rotigotine (NEUPRO) 1 MG/24HR PT24 Place 2 patches (2 mg total) onto the skin daily.   Saline (ARY NASAL MIST ALLERGY/SINUS) 2.65 % SOLN Place 1 spray into both nostrils in the morning and at bedtime.   simethicone (MYLICON) 80 MG chewable tablet Chew 80 mg by mouth with breakfast, with lunch, and with evening meal.   sodium fluoride (PREVIDENT 5000 PLUS) 1.1 % CREA dental cream Place 1 application. onto teeth in the morning and at bedtime.   trimethoprim (TRIMPEX) 100 MG tablet Take 100 mg by mouth at bedtime.   zinc oxide 20 % ointment Apply 1 application. topically as needed for irritation. Apply to buttocks after every incontinent episode and for redness   No facility-administered encounter medications on file as of 02/06/2022.    Review of Systems  Constitutional:  Negative  for appetite change, fatigue and fever.  HENT:  Positive for hearing loss. Negative for congestion, rhinorrhea and trouble swallowing.   Eyes:  Positive for visual disturbance.       C/o double vision  Respiratory:  Positive for shortness of breath. Negative for cough, chest tightness and wheezing.   Cardiovascular:  Positive for leg swelling.  Gastrointestinal:  Negative for abdominal pain and constipation.  Genitourinary:  Positive for frequency. Negative for dysuria, genital sores and urgency.       Bladder discomfort prior to urination after waited for over 4-5 hours.   Musculoskeletal:  Positive for arthralgias, back pain and gait problem. Negative for myalgias.       Lower back discomfort positional. Chronic shoulder pain L>R, full ROM  Skin:  Positive for rash.       Excoriated perineal/urogenital area.   Neurological:  Positive for tremors. Negative for speech difficulty, weakness and headaches.       Moves slow, fine tremor in fingers, burning sensation  in the R+ L great toes when touched by sheets, comes/goes  Psychiatric/Behavioral:  Negative for behavioral problems and sleep disturbance. The patient is nervous/anxious.        Feels anxious sometimes.     Immunization History  Administered Date(s) Administered   Influenza Split 10/20/2012   Influenza Whole 10/21/2006   Influenza, High Dose Seasonal PF 10/17/2016, 11/02/2019, 11/12/2021   Influenza-Unspecified 10/20/2013, 10/22/2017, 11/07/2020   Moderna Sars-Covid-2 Vaccination 01/22/2019, 02/19/2019, 11/29/2019, 06/19/2020   Pfizer Covid-19 Vaccine Bivalent Booster 64yr & up 10/10/2020, 11/21/2021   Pneumococcal Conjugate-13 01/31/2013   Pneumococcal Polysaccharide-23 01/20/2005, 11/24/2011   Td 01/21/2004   Tdap 03/16/2014   Zoster, Live 04/17/2009   Pertinent  Health Maintenance Due  Topic Date Due   INFLUENZA VACCINE  Completed   DEXA SCAN  Completed  COLONOSCOPY (Pts 45-39yr Insurance coverage will need to be  confirmed)  Discontinued      04/18/2021    8:05 AM 10/11/2021    8:48 AM 11/08/2021    3:36 PM 01/17/2022    9:02 AM 01/28/2022   11:54 AM  [place] the past year?  0 1 0 0  Was there an injury with Fall?  0 0 0 0  Fall Risk Category Calculator  0 1 0 0  Fall Risk Category (Retired)  Low Low Low Low  (RETIRED) Patient Fall Risk Level Moderate fall risk Low fall risk Low fall risk Low fall risk Low fall risk  Patient at Risk for Falls Due to  No Fall Risks No Fall Risks No Fall Risks No Fall Risks  Fall risk Follow up  Falls evaluation completed Falls evaluation completed Falls evaluation completed Falls evaluation completed   Functional Status Survey:    Vitals:   02/06/22 1053  BP: (!) 94/56  Pulse: 70  Resp: 14  Temp: 97.6 F (36.4 C)  SpO2: 97%  Weight: 131 lb (59.4 kg)  Height: '5\' 8"'$  (1.727 m)   Body mass index is 19.92 kg/m. Physical Exam Vitals and nursing note reviewed.  Constitutional:      Appearance: Normal appearance.  HENT:     Head: Normocephalic and atraumatic.     Nose: Nose normal.     Mouth/Throat:     Mouth: Mucous membranes are moist.  Eyes:     Extraocular Movements: Extraocular movements intact.     Conjunctiva/sclera: Conjunctivae normal.     Pupils: Pupils are equal, round, and reactive to light.     Comments: Chronic double vision, see Ophthalmology 09/20/20  Cardiovascular:     Rate and Rhythm: Normal rate. Rhythm irregular.     Heart sounds: No murmur heard.    Comments: DP pulses present L>R Pulmonary:     Effort: Pulmonary effort is normal.     Breath sounds: No rales.  Abdominal:     General: Bowel sounds are normal.     Palpations: Abdomen is soft.     Tenderness: There is no abdominal tenderness.  Musculoskeletal:     Cervical back: Normal range of motion and neck supple.     Right lower leg: Edema present.     Left lower leg: Edema present.     Comments: trace edema LLE, 1+ RLE. Chronic lower back, shoulder R+L pain.    Skin:    General: Skin is warm and dry.     Findings: Erythema and rash present.     Comments: ABI showed moderate PAD. Excoriated perineal/urogenital area  Neurological:     General: No focal deficit present.     Mental Status: She is alert and oriented to person, place, and time. Mental status is at baseline.     Coordination: Coordination abnormal.     Gait: Gait abnormal.     Comments: Moves slow, tremor in hands. No facial or limb weakness.   Psychiatric:        Mood and Affect: Mood normal.        Behavior: Behavior normal.     Labs reviewed: Recent Labs    06/18/21 0000  NA 142  K 4.4  CL 108  CO2 27*  BUN 21  CREATININE 0.8  CALCIUM 9.3   Recent Labs    06/18/21 0000  AST 13  ALT 6*  ALKPHOS 48  ALBUMIN 4.0   Recent Labs  06/18/21 0000  WBC 4.4  NEUTROABS 2,803.00  HGB 11.7*  HCT 35*  PLT 159   Lab Results  Component Value Date   TSH 1.30 03/29/2020   No results found for: "HGBA1C" Lab Results  Component Value Date   CHOL 185 03/24/2019   HDL 79 (A) 03/24/2019   LDLCALC 94 03/24/2019   LDLDIRECT 104.2 01/25/2013   TRIG 41 03/24/2019   CHOLHDL 2 08/29/2015    Significant Diagnostic Results in last 30 days:  No results found.  Assessment/Plan Candidiasis of female genitalia c/o sore in the urogenital and perineal area, noted excoriated skin in the area. Apply Nystatin cream/0.5% Triamcinolone cream bid x 10 days   Chronic UTI UTI, 01/31/22 urine culture Klebsiella pneumoniae >100,000c/ml, Augmentin 875/'125mg'$  q12hr x7 days  Anxiety  chronic, situational  PAD (peripheral artery disease) (HCC)  PAD 12/25/20 ABI R+L 0.7, moderate arterial disease, vascular specialist if desires.   GERD (gastroesophageal reflux disease) Stable, takes Protonix, Simethicone.   Bilateral lower extremity edema The patient has chronic edema BLE. Off Metolazone 09/27/19. Echo EF normal in the past.   Atrial fibrillation (HCC) heart rate is in control, on  Xarelto, off Diltiazem. Hgb 11.8 11/22/20, TSH 1.30 03/29/20  Osteoarthritis, multiple sites  on Tylenol, Methocarbamol, shoulder pain, f/u Ortho  Parkinsonism symptoms improved, on Sinemet, Comtan, started Neupro 05/08/21,  f/u neurology at [facility]. double vision, chronic, saw Ophthalmology   Hypotension Managed, takes Midodrine  Anemia stable, Hgb 11.8 11/22/20,  takes Fe  Urge incontinence of urine  Urge incontinent of urine, takes Mirabegron, needs urinate q3-4 hours to avoid bladder discomfort.      Family/ staff Communication: plan of care reviewed with the patient and charge nurse.   Labs/tests ordered: none  Time spend 35 minutes.

## 2022-02-06 NOTE — Assessment & Plan Note (Signed)
PAD 12/25/20 ABI R+L 0.7, moderate arterial disease, vascular specialist if desires.

## 2022-02-06 NOTE — Assessment & Plan Note (Signed)
heart rate is in control, on Xarelto, off Diltiazem. Hgb 11.8 11/22/20, TSH 1.30 03/29/20 

## 2022-02-06 NOTE — Assessment & Plan Note (Signed)
c/o sore in the urogenital and perineal area, noted excoriated skin in the area. Apply Nystatin cream/0.5% Triamcinolone cream bid x 10 days

## 2022-02-06 NOTE — Assessment & Plan Note (Signed)
symptoms improved, on Sinemet, Comtan, started Neupro 05/08/21,  f/u neurology at [facility]. double vision, chronic, saw Ophthalmology  

## 2022-02-06 NOTE — Assessment & Plan Note (Signed)
Urge incontinent of urine, takes Mirabegron, needs urinate q3-4 hours to avoid bladder discomfort.

## 2022-02-06 NOTE — Assessment & Plan Note (Signed)
Stable, takes Protonix, Simethicone.

## 2022-02-06 NOTE — Assessment & Plan Note (Signed)
UTI, 01/31/22 urine culture Klebsiella pneumoniae >100,000c/ml, Augmentin 875/'125mg'$  q12hr x7 days

## 2022-02-06 NOTE — Assessment & Plan Note (Signed)
Stable, continue MiraLax.  °

## 2022-02-06 NOTE — Assessment & Plan Note (Signed)
stable, Hgb 11.8 11/22/20,  takes Fe 

## 2022-02-06 NOTE — Assessment & Plan Note (Signed)
chronic, situational

## 2022-02-18 ENCOUNTER — Non-Acute Institutional Stay (SKILLED_NURSING_FACILITY): Payer: Medicare PPO | Admitting: Nurse Practitioner

## 2022-02-18 ENCOUNTER — Encounter: Payer: Self-pay | Admitting: Nurse Practitioner

## 2022-02-18 DIAGNOSIS — K219 Gastro-esophageal reflux disease without esophagitis: Secondary | ICD-10-CM | POA: Diagnosis not present

## 2022-02-18 DIAGNOSIS — G20C Parkinsonism, unspecified: Secondary | ICD-10-CM | POA: Diagnosis not present

## 2022-02-18 DIAGNOSIS — R262 Difficulty in walking, not elsewhere classified: Secondary | ICD-10-CM | POA: Diagnosis not present

## 2022-02-18 DIAGNOSIS — K5901 Slow transit constipation: Secondary | ICD-10-CM

## 2022-02-18 DIAGNOSIS — G903 Multi-system degeneration of the autonomic nervous system: Secondary | ICD-10-CM

## 2022-02-18 DIAGNOSIS — N3281 Overactive bladder: Secondary | ICD-10-CM

## 2022-02-18 DIAGNOSIS — I480 Paroxysmal atrial fibrillation: Secondary | ICD-10-CM

## 2022-02-18 DIAGNOSIS — R2689 Other abnormalities of gait and mobility: Secondary | ICD-10-CM | POA: Diagnosis not present

## 2022-02-18 DIAGNOSIS — D500 Iron deficiency anemia secondary to blood loss (chronic): Secondary | ICD-10-CM

## 2022-02-18 DIAGNOSIS — M6281 Muscle weakness (generalized): Secondary | ICD-10-CM | POA: Diagnosis not present

## 2022-02-18 NOTE — Assessment & Plan Note (Signed)
Maintained, continue Midodrine.

## 2022-02-18 NOTE — Assessment & Plan Note (Signed)
symptoms improved, on Sinemet, Comtan, started Neupro 05/08/21,  f/u neurology at [facility]. double vision, chronic, saw Ophthalmology  

## 2022-02-18 NOTE — Assessment & Plan Note (Signed)
Stable,  takes Protonix, Simethicone.

## 2022-02-18 NOTE — Assessment & Plan Note (Signed)
heart rate is in control, on Xarelto, off Diltiazem. Hgb 11.8 11/22/20, TSH 1.30 03/29/20 

## 2022-02-18 NOTE — Progress Notes (Signed)
Location:  [place] Room Number: NO/13/A Place of Service:  SNF (31) Provider:  Cherelle Midkiff X, NP   Patient Care Team: [name], MD as PCP - General (Internal Medicine) [name], [name], MD as PCP - Cardiology (Cardiology) [name], [name], DO as Consulting Physician (Neurology) Callaway Hardigree X, NP as Nurse Practitioner (Internal Medicine)  Extended Emergency Contact Information Primary Emergency Contact: Denine, Brotz Mobile Phone: [phone] Relation: Daughter Interpreter needed? No Secondary Emergency Contact: [facility] Address: [address]          [place], [place] [address] [name] of [place] Phone: [phone] Mobile Phone: [phone] Relation: Son  Code Status:  DNR Goals of care: Advanced Directive information    02/18/2022    9:59 AM  Advanced Directives  Does Patient Have a Medical Advance Directive? Yes  Type of Paramedic of [place];Out of facility DNR (pink MOST or yellow form)  Does patient want to make changes to medical advance directive? No - Patient declined  Copy of [place] in Chart? Yes - validated most recent copy scanned in chart (See row information)     Chief Complaint  Patient presents with   Medical Management of Chronic Issues    Patient is here for a follow up for chronic conditions     HPI:  Pt is a 82 y.o. female seen today for medical management of chronic diseases.      Anxiety, chronic, situational             PAD 12/25/20 ABI R+L 0.7, moderate arterial disease, vascular specialist if desires.              GERD, takes Protonix, Simethicone.              The patient has chronic edema BLE. Off Metolazone 09/27/19. Echo EF normal in the past.              Afib, heart rate is in control, on Xarelto, off Diltiazem. Hgb 11.8 11/22/20, TSH 1.30 03/29/20             OA,  on Tylenol, Methocarbamol, shoulder pain, f/u Ortho Parkinson's, symptoms improved,  on Sinemet, Comtan, started Neupro 05/08/21,  f/u neurology at [facility]. double vision, chronic, saw Ophthalmology              Orthostatic hypotension, takes Midodrine.              Anemia, stable, Hgb 11.8 11/22/20,  takes Fe             Urge incontinent of urine, takes Mirabegron, needs urinate q3-4 hours to avoid bladder discomfort.              Recurrent UTI, saw Urology, placed on Estrace x2/wk, TMP '100mg'$  daily             Constipation, takes MiraLax Past Medical History:  Diagnosis Date   Acute lower UTI 09/14/2018   Anemia 10/15/2018   2016 colonoscopy 10/14/18 wbc 5.4, Hgb 10.1, plt 184 12/01/18 wbc 4.5, Hgb 11.1, plt 188, neutrophils 63,  Na 139, K 3.7, Bun 23, creat 0.69, eGFR 83 on Fe, Hgb 11.7 12/09/18    Atrial fibrillation (HCC) 05/21/2010   10/07/18 Na 143, K 4.0, Bun 20, creat 0.79, eGFR 72, wbc 7.1, Hgb 10.9, plt 172 10/28/18 Na 142, K 4.0, Bun 19, creat 0.77, eGFR 74 11/18/18 Na 144, K 4.1, Bun 21, creat 0.69, eGFR 83  Bilateral lower extremity edema 07/19/2018   11/02/18 BMP 2 weeks.    Cervical pain (neck) 06/20/2010   Closed fracture of part of upper end of humerus 05/01/2015   Colles' fracture of right radius 03/05/2015   Constipation 07/19/2018   DEGENERATIVE JOINT DISEASE, RIGHT HIP 02/16/2007   Dupuytren's contracture    Dysuria 09/20/2018   09/20/18 c/o got up several times last night to go urinate, burning on urination, lower abd /back discomfort, but urinary frequency, leakage are not new. UA C/S, Pyridium '100mg'$  tid x 2 days.  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/2017   S/P breast biopsy, left    two o'clock position - benign   Toxic effect of venom(989.5) 07/27/2007   Tremor, unspecified 10/19/2015   Unstable gait 02/16/2017   Vaginal atrophy 10/19/2015   Venous insufficiency    Weakness 09/12/2018   Past Surgical History:  Procedure Laterality Date   BREAST BIOPSY Left    CATARACT EXTRACTION, BILATERAL      Allergies  Allergen Reactions   Doxycycline Other (See Comments)    Unknown   Sulfonamide Derivatives Other (See Comments)    Unknown     Outpatient Encounter Medications as of 02/18/2022  Medication Sig   acetaminophen (TYLENOL) 500 MG tablet Take 1,000 mg by mouth at bedtime. Give 2 tablet by mouth as needed for PAIN related to Polyosteoarthritis, unspecified (M15.9) TID PRN, NOT TO EXCEED 3,000 MG IN 24 HRS, SCHEDULED PLUS PRN DOSES   AMOXICILLIN-POT CLAVULANATE PO Take by mouth. Give 1 tablet by mouth every 12 hours related to URINARY TRACT INFECTION, SITE NOT SPECIFIED (N39.0) for 7 Days   calcium carbonate (TUMS EX) 750 MG chewable tablet Chew 1 tablet by mouth daily.   carbidopa-levodopa (PARCOPA) 25-100 MG disintegrating tablet Take 2 tablets by mouth 5 (five) times daily.   carbidopa-levodopa (PARCOPA) 25-100 MG disintegrating tablet Take 1 tablet by mouth See admin instructions. May give one additional tablet daily in addition to scheduled dose as needed for increased symptoms related to Parkinson's. For taking at 11am if needed. Do not take after 1130 or within one hour of next dose.   carbidopa-levodopa (SINEMET CR) 50-200 MG tablet Take 1 tablet by mouth at bedtime.   Cholecalciferol (VITAMIN D3) 10 MCG (400 UNIT) CAPS Take 1 capsule by mouth daily.   Cranberry 450 MG TABS Take 1 tablet by mouth daily.   diclofenac Sodium (VOLTAREN) 1 % GEL Apply 2 g topically 3 (three) times daily as needed (Neck pain).   entacapone (COMTAN) 200 MG tablet Take 200 mg by mouth 3 (three) times daily.   ferrous  sulfate 325 (65 FE) MG tablet Take 325 mg by mouth every Monday, Wednesday, and Friday.   hydrocortisone (ANUSOL-HC) 2.5 % rectal cream Place 1 application. rectally daily as needed for hemorrhoids.   lactose free nutrition (BOOST) LIQD Take 237 mLs by mouth daily.   methocarbamol (ROBAXIN) 500 MG tablet Take 250 mg by mouth daily as needed for muscle spasms.   midodrine (PROAMATINE)  5 MG tablet Take 5 mg by mouth in the morning, at noon, and at bedtime.   mirabegron ER (MYRBETRIQ) 50 MG TB24 tablet Take 50 mg by mouth daily.   nitrofurantoin (MACRODANTIN) 100 MG capsule Take 100 mg by mouth daily. related to URINARY TRACT INFECTION   pantoprazole (PROTONIX) 40 MG tablet Take 40 mg by mouth daily.   polyethylene glycol (MIRALAX / GLYCOLAX) 17 g packet Take 17 g by mouth 2 (two) times daily.   polyvinyl alcohol (LIQUIFILM TEARS) 1.4 % ophthalmic solution Place 1 drop into both eyes 3 (three) times daily as needed for dry eyes.   rivaroxaban (XARELTO) 20 MG TABS tablet Take 20 mg by mouth daily with supper.   Rotigotine (NEUPRO) 1 MG/24HR PT24 Place 2 patches (2 mg total) onto the skin daily.   Saline (ARY NASAL MIST ALLERGY/SINUS) 2.65 % SOLN Place 1 spray into both nostrils in the morning and at bedtime.   simethicone (MYLICON) 80 MG chewable tablet Chew 80 mg by mouth with breakfast, with lunch, and with evening meal.   sodium fluoride (PREVIDENT 5000 PLUS) 1.1 % CREA dental cream Place 1 application. onto teeth in the morning and at bedtime.   trimethoprim (TRIMPEX) 100 MG tablet Take 100 mg by mouth at bedtime.   zinc oxide 20 % ointment Apply 1 application. topically as needed for irritation. Apply to buttocks after every incontinent episode and for redness   No facility-administered encounter medications on file as of 02/18/2022.    Review of Systems  Constitutional:  Negative for appetite change, fatigue and fever.  HENT:  Positive for hearing loss. Negative for congestion, rhinorrhea and  trouble swallowing.   Eyes:  Positive for visual disturbance.       C/o double vision  Respiratory:  Positive for shortness of breath. Negative for cough, chest tightness and wheezing.   Cardiovascular:  Positive for leg swelling.  Gastrointestinal:  Negative for abdominal pain and constipation.  Genitourinary:  Positive for frequency. Negative for dysuria, genital sores and urgency.       Bladder discomfort prior to urination after waited for over 4-5 hours.   Musculoskeletal:  Positive for arthralgias, back pain and gait problem. Negative for myalgias.       Lower back discomfort positional. Chronic shoulder pain L>R, full ROM  Skin:  Negative for color change.  Neurological:  Positive for tremors. Negative for speech difficulty, weakness and headaches.       Moves slow, fine tremor in fingers, burning sensation  in the R+ L great toes when touched by sheets, comes/goes  Psychiatric/Behavioral:  Negative for behavioral problems and sleep disturbance. The patient is nervous/anxious.        Feels anxious sometimes.     Immunization History  Administered Date(s) Administered   Influenza Split 10/20/2012   Influenza Whole 10/21/2006   Influenza, High Dose Seasonal PF 10/17/2016, 11/02/2019, 11/12/2021   Influenza-Unspecified 10/20/2013, 10/22/2017, 11/07/2020   Moderna Sars-Covid-2 Vaccination 01/22/2019, 02/19/2019, 11/29/2019, 06/19/2020   Pfizer Covid-19 Vaccine Bivalent Booster 53yr & up 10/10/2020, 11/21/2021   Pneumococcal Conjugate-13 01/31/2013   Pneumococcal Polysaccharide-23 01/20/2005, 11/24/2011   Td 01/21/2004   Tdap 03/16/2014   Zoster Recombinat (Shingrix) 01/27/2022, 01/27/2022   Zoster, Live 04/17/2009   Pertinent  Health Maintenance Due  Topic Date Due   INFLUENZA VACCINE  Completed   DEXA SCAN  Completed   COLONOSCOPY (Pts 45-459yrInsurance coverage will need to be confirmed)  Discontinued      10/11/2021  8:48 AM 11/08/2021    3:36 PM 01/17/2022    9:02  AM 01/28/2022   11:54 AM 02/18/2022    9:59 AM  Fall Risk  Falls in the past year? 0 1 0 0 0  Was there an injury with Fall? 0 0 0 0 0  Fall Risk Category Calculator 0 1 0 0 0  Fall Risk Category (Retired) Low Low Low Low   (RETIRED) Patient Fall Risk Level Low fall risk Low fall risk Low fall risk Low fall risk   Patient at Risk for Falls Due to No Fall Risks No Fall Risks No Fall Risks No Fall Risks No Fall Risks  Fall risk Follow up Falls evaluation completed Falls evaluation completed Falls evaluation completed Falls evaluation completed Falls evaluation completed   Functional Status Survey:    Vitals:   02/18/22 0957  BP: (!) 94/56  Pulse: 70  Resp: 14  Temp: 97.6 F (36.4 C)  SpO2: 97%  Weight: 131 lb 1.6 oz (59.5 kg)  Height: '5\' 8"'$  (1.727 m)   Body mass index is 19.93 kg/m. Physical Exam Vitals and nursing note reviewed.  Constitutional:      Appearance: Normal appearance.  HENT:     Head: Normocephalic and atraumatic.     Nose: Nose normal.     Mouth/Throat:     Mouth: Mucous membranes are moist.  Eyes:     Extraocular Movements: Extraocular movements intact.     Conjunctiva/sclera: Conjunctivae normal.     Pupils: Pupils are equal, round, and reactive to light.     Comments: Chronic double vision, see Ophthalmology 09/20/20  Cardiovascular:     Rate and Rhythm: Normal rate. Rhythm irregular.     Heart sounds: No murmur heard.    Comments: DP pulses present L>R Pulmonary:     Effort: Pulmonary effort is normal.     Breath sounds: No rales.  Abdominal:     General: Bowel sounds are normal.     Palpations: Abdomen is soft.     Tenderness: There is no abdominal tenderness.  Musculoskeletal:     Cervical back: Normal range of motion and neck supple.     Right lower leg: Edema present.     Left lower leg: Edema present.     Comments: Trace -1+ edema BLE. Chronic lower back, shoulder R+L pain.   Skin:    General: Skin is warm and dry.     Comments: ABI showed  moderate PAD  Neurological:     General: No focal deficit present.     Mental Status: She is alert and oriented to person, place, and time. Mental status is at baseline.     Coordination: Coordination abnormal.     Gait: Gait abnormal.     Comments: Moves slow, tremor in hands. No facial or limb weakness.   Psychiatric:        Mood and Affect: Mood normal.        Behavior: Behavior normal.     Labs reviewed: Recent Labs    06/18/21 0000  NA 142  K 4.4  CL 108  CO2 27*  BUN 21  CREATININE 0.8  CALCIUM 9.3   Recent Labs    06/18/21 0000  AST 13  ALT 6*  ALKPHOS 48  ALBUMIN 4.0   Recent Labs    06/18/21 0000  WBC 4.4  NEUTROABS 2,803.00  HGB 11.7*  HCT 35*  PLT 159   Lab Results  Component Value Date  TSH 1.30 03/29/2020   No results found for: "HGBA1C" Lab Results  Component Value Date   CHOL 185 03/24/2019   HDL 79 (A) 03/24/2019   LDLCALC 94 03/24/2019   LDLDIRECT 104.2 01/25/2013   TRIG 41 03/24/2019   CHOLHDL 2 08/29/2015    Significant Diagnostic Results in last 30 days:  No results found.  Assessment/Plan GERD (gastroesophageal reflux disease) Stable,  takes Protonix, Simethicone.   Atrial fibrillation (HCC) heart rate is in control, on Xarelto, off Diltiazem. Hgb 11.8 11/22/20, TSH 1.30 03/29/20  Hypotension Maintained, continue Midodrine.   Parkinsonism  symptoms improved, on Sinemet, Comtan, started Neupro 05/08/21,  f/u neurology at [facility]. double vision, chronic, saw Ophthalmology   Anemia  stable, Hgb 11.8 11/22/20,  takes Fe  Overactive bladder Urge incontinent of urine, takes Mirabegron, needs urinate q3-4 hours to avoid bladder discomfort.              Recurrent UTI, saw Urology, placed on Estrace x2/wk, TMP '100mg'$  daily  Slow transit constipation Stable, continue MiraLax.      Family/ staff Communication: plan of care reviewed with the patient and charge nurse.   Labs/tests ordered: none  Time spend 35 minutes.

## 2022-02-18 NOTE — Assessment & Plan Note (Signed)
Stable, continue MiraLax.  °

## 2022-02-18 NOTE — Assessment & Plan Note (Signed)
stable, Hgb 11.8 11/22/20,  takes Fe 

## 2022-02-18 NOTE — Assessment & Plan Note (Signed)
Urge incontinent of urine, takes Mirabegron, needs urinate q3-4 hours to avoid bladder discomfort.              Recurrent UTI, saw Urology, placed on Estrace x2/wk, TMP '100mg'$  daily

## 2022-02-24 DIAGNOSIS — R1312 Dysphagia, oropharyngeal phase: Secondary | ICD-10-CM | POA: Diagnosis not present

## 2022-02-24 DIAGNOSIS — M6281 Muscle weakness (generalized): Secondary | ICD-10-CM | POA: Diagnosis not present

## 2022-02-24 DIAGNOSIS — R2689 Other abnormalities of gait and mobility: Secondary | ICD-10-CM | POA: Diagnosis not present

## 2022-02-24 DIAGNOSIS — R471 Dysarthria and anarthria: Secondary | ICD-10-CM | POA: Diagnosis not present

## 2022-02-24 DIAGNOSIS — R262 Difficulty in walking, not elsewhere classified: Secondary | ICD-10-CM | POA: Diagnosis not present

## 2022-02-25 DIAGNOSIS — R1312 Dysphagia, oropharyngeal phase: Secondary | ICD-10-CM | POA: Diagnosis not present

## 2022-02-25 DIAGNOSIS — R2689 Other abnormalities of gait and mobility: Secondary | ICD-10-CM | POA: Diagnosis not present

## 2022-02-25 DIAGNOSIS — R471 Dysarthria and anarthria: Secondary | ICD-10-CM | POA: Diagnosis not present

## 2022-02-25 DIAGNOSIS — M6281 Muscle weakness (generalized): Secondary | ICD-10-CM | POA: Diagnosis not present

## 2022-02-25 DIAGNOSIS — R262 Difficulty in walking, not elsewhere classified: Secondary | ICD-10-CM | POA: Diagnosis not present

## 2022-02-27 ENCOUNTER — Encounter: Payer: Self-pay | Admitting: Adult Health

## 2022-02-27 ENCOUNTER — Non-Acute Institutional Stay (SKILLED_NURSING_FACILITY): Payer: Medicare PPO | Admitting: Adult Health

## 2022-02-27 DIAGNOSIS — K5901 Slow transit constipation: Secondary | ICD-10-CM | POA: Diagnosis not present

## 2022-02-27 DIAGNOSIS — R471 Dysarthria and anarthria: Secondary | ICD-10-CM | POA: Diagnosis not present

## 2022-02-27 DIAGNOSIS — M6281 Muscle weakness (generalized): Secondary | ICD-10-CM | POA: Diagnosis not present

## 2022-02-27 DIAGNOSIS — G20C Parkinsonism, unspecified: Secondary | ICD-10-CM

## 2022-02-27 DIAGNOSIS — R262 Difficulty in walking, not elsewhere classified: Secondary | ICD-10-CM | POA: Diagnosis not present

## 2022-02-27 DIAGNOSIS — B372 Candidiasis of skin and nail: Secondary | ICD-10-CM | POA: Diagnosis not present

## 2022-02-27 DIAGNOSIS — R2689 Other abnormalities of gait and mobility: Secondary | ICD-10-CM | POA: Diagnosis not present

## 2022-02-27 DIAGNOSIS — R1312 Dysphagia, oropharyngeal phase: Secondary | ICD-10-CM | POA: Diagnosis not present

## 2022-02-27 NOTE — Progress Notes (Signed)
Location:  [place] Room Number: [place] of Service:  SNF (31) Provider:  Durenda Age, DNP, FNP-BC  Patient Care Team: [name], MD as PCP - General (Internal Medicine) [name], [name], MD as PCP - Cardiology (Cardiology) [name], [name], DO as Consulting Physician (Neurology) Mast, Man X, NP as Nurse Practitioner (Internal Medicine)  Extended Emergency Contact Information Primary Emergency Contact: Ashleyann, [place] Mobile Phone: [phone] Relation: Daughter Interpreter needed? No Secondary Emergency Contact: [facility] Address: [address]          [place], [place] [address] [name] of [place] Phone: [phone] Mobile Phone: [phone] Relation: Son  Code Status:  DNR  Goals of care: Advanced Directive information    02/27/2022   11:17 AM  Advanced Directives  Does Patient Have a Medical Advance Directive? Yes  Type of Paramedic of [place];Out of facility DNR (pink MOST or yellow form)  Does patient want to make changes to medical advance directive? No - Patient declined  Copy of [place] in Chart? Yes - validated most recent copy scanned in chart (See row information)  Pre-existing out of facility DNR order (yellow form or pink MOST form) Yellow form placed in chart (order not valid for inpatient use)     Chief Complaint  Patient presents with   Acute Visit    Bottom pain    HPI:  Pt is a 82 y.o. female seen today for an acute visit regarding "bottom" pain. She is a long-term care resident of [facility]. She has a PMH of paroxysmal atrial fibrillation, osteoarthritis, chronic venous insufficiency and Dupuytren's contracture. She was seen in her room today. She complained that her perineal area is hurting/burning. Noted erythematous rashes on her perineal area. She, also, complained of constipation. She has Parkinson's disease and takes  Carbidopa-Levodopa.    Past Medical History:  Diagnosis Date   Acute lower UTI 09/14/2018   Anemia 10/15/2018   2016 colonoscopy 10/14/18 wbc 5.4, Hgb 10.1, plt 184 12/01/18 wbc 4.5, Hgb 11.1, plt 188, neutrophils 63,  Na 139, K 3.7, Bun 23, creat 0.69, eGFR 83 on Fe, Hgb 11.7 12/09/18    Atrial fibrillation ([place]) 05/21/2010   10/07/18 Na 143, K 4.0, Bun 20, creat 0.79, eGFR 72, wbc 7.1, Hgb 10.9, plt 172 10/28/18 Na 142, K 4.0, Bun 19, creat 0.77, eGFR 74 11/18/18 Na 144, K 4.1, Bun 21, creat 0.69, eGFR 83   Bilateral lower extremity edema 07/19/2018   11/02/18 BMP 2 weeks.    Cervical pain (neck) 06/20/2010   Closed fracture of part of upper end of humerus 05/01/2015   Colles' fracture of right radius 03/05/2015   Constipation 07/19/2018   DEGENERATIVE JOINT DISEASE, RIGHT HIP 02/16/2007   Dupuytren's contracture    Dysuria 09/20/2018   09/20/18 c/o got up several times last night to go urinate, burning on urination, lower abd /back discomfort, but urinary frequency, leakage are not new. UA C/S, Pyridium 152m tid x 2 days.  09/21/18 wbc 6.1, Hgb 11.8, plt 210, neutrophil 69.4, Na 142, K 4.1, Bun 19, creat 0.78, TP 6.4, albumin 3.9   Hematuria 02/04/2014   Hypotension 09/18/2018   Long term (current) use of anticoagulants 05/29/2016   Osteoarthritis of hip    right   Osteoarthritis of left knee    Overactive bladder 10/06/2018   Parkinson disease    Parox[place]mal [place]fib (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/2017   S/P breast biopsy, left    two o'clock position - benign   Toxic effect of venom(989.5) 07/27/2007   Tremor, unspecified 10/19/2015   Unstable gait 02/16/2017   Vaginal atrophy 10/19/2015   Venous insufficiency    Weakness 09/12/2018   Past Surgical History:  Procedure Laterality Date   BREAST BIOPSY Left    CATARACT EXTRACTION, BILATERAL      Allergies  Allergen Reactions   Doxycycline Other  (See Comments)    Unknown   Sulfonamide Derivatives Other (See Comments)    Unknown     Outpatient Encounter Medications as of 02/27/2022  Medication Sig   acetaminophen (TYLENOL) 500 MG tablet Take 1,000 mg by mouth at bedtime. 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   carbidopa-levodopa (PARCOPA) 25-100 MG disintegrating tablet Take 2 tablets by mouth 5 (five) times daily.   carbidopa-levodopa (PARCOPA) 25-100 MG disintegrating tablet Take 1 tablet by mouth See admin instructions. May give one additional tablet daily in addition to scheduled dose as needed for increased symptoms related to Parkinson's. For taking at 11am if needed. Do not take after 1130 or within one hour of next dose.   carbidopa-levodopa (SINEMET CR) 50-200 MG tablet Take 1 tablet by mouth at bedtime.   Cholecalciferol (VITAMIN D3) 10 MCG (400 UNIT) CAPS Take 1 capsule by mouth daily.   Cranberry 450 MG TABS Take 1 tablet by mouth daily.   diclofenac Sodium (VOLTAREN) 1 % GEL Apply 2 g topically 3 (three) times daily as needed (Neck pain).   entacapone (COMTAN) 200 MG tablet Take 200 mg by mouth 3 (three) times daily.   ferrous sulfate 325 (65 FE) MG tablet Take 325 mg by mouth every Monday, Wednesday, and Friday.   hydrocortisone (ANUSOL-HC) 2.5 % rectal cream Place 1 application. rectally daily as needed for hemorrhoids.   lactose free nutrition (BOOST) LIQD Take 237 mLs by mouth daily.   methocarbamol (ROBAXIN) 500 MG tablet Take 250 mg by mouth daily as needed for muscle spasms.   midodrine (PROAMATINE) 5 MG tablet Take 5 mg by mouth in the morning, at noon, and at bedtime.   mirabegron ER (MYRBETRIQ) 50 MG TB24 tablet Take 50 mg by mouth daily.   nitrofurantoin (MACRODANTIN) 100 MG capsule Take 100 mg by mouth daily.  related to URINARY TRACT INFECTION   pantoprazole (PROTONIX) 40 MG tablet Take 40 mg by mouth daily.   polyethylene glycol (MIRALAX / GLYCOLAX) 17 g packet Take 17 g by mouth 2 (two) times daily.   polyvinyl alcohol (LIQUIFILM TEARS) 1.4 % ophthalmic solution Place 1 drop into both eyes 3 (three) times daily as needed for dry eyes.   rivaroxaban (XARELTO) 20 MG TABS tablet Take 20 mg by mouth daily with supper.   rotigotine (NEUPRO) 2 MG/24HR Place 1 patch onto the skin daily.   Saline (ARY NASAL MIST ALLERGY/SINUS) 2.65 % SOLN Place 1 spray into both nostrils in the morning and at bedtime.   simethicone (MYLICON) 80 MG chewable tablet Chew 80 mg by mouth with breakfast, with lunch, and with evening meal.   sodium fluoride (PREVIDENT 5000 PLUS) 1.1 % CREA dental cream Place 1 application. onto teeth in the morning and at bedtime.  zinc oxide 20 % ointment Apply 1 application. topically as needed for irritation. Apply to buttocks after every incontinent episode and for redness   [DISCONTINUED] AMOXICILLIN-POT CLAVULANATE PO Take by mouth. Give 1 tablet by mouth every 12 hours related to URINARY TRACT INFECTION, SITE NOT SPECIFIED (N39.0) for 7 Days   [DISCONTINUED] Rotigotine (NEUPRO) 1 MG/24HR PT24 Place 2 patches (2 mg total) onto the skin daily. (Patient taking differently: Place 1 patch onto the skin daily.)   [DISCONTINUED] trimethoprim (TRIMPEX) 100 MG tablet Take 100 mg by mouth at bedtime.   No facility-administered encounter medications on file as of 02/27/2022.    Review of Systems  Constitutional:  Negative for appetite change, chills, fatigue and fever.  HENT:  Negative for congestion, hearing loss, rhinorrhea and sore throat.   Eyes: Negative.   Respiratory:  Negative for cough, shortness of breath and wheezing.   Cardiovascular:  Negative for chest pain, palpitations and leg swelling.  Gastrointestinal:  Positive for constipation. Negative for abdominal pain, diarrhea, nausea and  vomiting.  Genitourinary:  Negative for dysuria.  Musculoskeletal:  Negative for arthralgias, back pain and myalgias.  Skin:  Positive for rash. Negative for color change and wound.  Neurological:  Negative for dizziness, weakness and headaches.  Psychiatric/Behavioral:  Negative for behavioral problems. The patient is not nervous/anxious.        Immunization History  Administered Date(s) Administered   Influenza Split 10/20/2012   Influenza Whole 10/21/2006   Influenza, High Dose Seasonal PF 10/17/2016, 11/02/2019, 11/12/2021   Influenza-Unspecified 10/20/2013, 10/22/2017, 11/07/2020   Moderna Sars-Covid-2 Vaccination 01/22/2019, 02/19/2019, 11/29/2019, 06/19/2020   Pfizer Covid-19 Vaccine Bivalent Booster 9yr & up 10/10/2020, 11/21/2021   Pneumococcal Conjugate-13 01/31/2013   Pneumococcal Polysaccharide-23 01/20/2005, 11/24/2011   Td 01/21/2004   Tdap 03/16/2014   Zoster Recombinat (Shingrix) 01/27/2022, 01/27/2022   Zoster, Live 04/17/2009   Pertinent  Health Maintenance Due  Topic Date Due   INFLUENZA VACCINE  Completed   DEXA SCAN  Completed   COLONOSCOPY (Pts 45-462yrInsurance coverage will need to be confirmed)  Discontinued      10/11/2021    8:48 AM 11/08/2021    3:36 PM 01/17/2022    9:02 AM 01/28/2022   11:54 AM 02/18/2022    9:59 AM  Fall Risk  Falls in the past year? 0 1 0 0 0  Was there an injury with Fall? 0 0 0 0 0  Fall Risk Category Calculator 0 1 0 0 0  Fall Risk Category (Retired) Low Low Low Low   (RETIRED) Patient Fall Risk Level Low fall risk Low fall risk Low fall risk Low fall risk   Patient at Risk for Falls Due to No Fall Risks No Fall Risks No Fall Risks No Fall Risks No Fall Risks  Fall risk Follow up Falls evaluation completed Falls evaluation completed Falls evaluation completed Falls evaluation completed Falls evaluation completed     Vitals:   02/27/22 1113  BP: (!) 94/56  Pulse: 70  Resp: 14  Temp: 97.6 F (36.4 C)  Weight: 129  lb 8 oz (58.7 kg)  Height: 5' 8"$  (1.727 m)   Body mass index is 19.69 kg/m.  Physical Exam Constitutional:      General: She is not in acute distress. HENT:     Head: Normocephalic and atraumatic.     Nose: Nose normal.     Mouth/Throat:     Mouth: Mucous membranes are moist.  Eyes:     Conjunctiva/sclera: Conjunctivae normal.  Cardiovascular:     Rate and Rhythm: Normal rate and regular rhythm.  Pulmonary:     Effort: Pulmonary effort is normal.     Breath sounds: Normal breath sounds.  Abdominal:     General: Bowel sounds are normal.     Palpations: Abdomen is soft.  Musculoskeletal:        General: Normal range of motion.     Cervical back: Normal range of motion.  Skin:    General: Skin is warm and dry.  Neurological:     General: No focal deficit present.     Mental Status: She is alert and oriented to person, place, and time.  Psychiatric:        Mood and Affect: Mood normal.        Behavior: Behavior normal.        Thought Content: Thought content normal.        Judgment: Judgment normal.      Labs reviewed: Recent Labs    06/18/21 0000  NA 142  K 4.4  CL 108  CO2 27*  BUN 21  CREATININE 0.8  CALCIUM 9.3   Recent Labs    06/18/21 0000  AST 13  ALT 6*  ALKPHOS 48  ALBUMIN 4.0   Recent Labs    06/18/21 0000  WBC 4.4  NEUTROABS 2,803.00  HGB 11.7*  HCT 35*  PLT 159   Lab Results  Component Value Date   TSH 1.30 03/29/2020   No results found for: "HGBA1C" Lab Results  Component Value Date   CHOL 185 03/24/2019   HDL 79 (A) 03/24/2019   LDLCALC 94 03/24/2019   LDLDIRECT 104.2 01/25/2013   TRIG 41 03/24/2019   CHOLHDL 2 08/29/2015    Significant Diagnostic Results in last 30 days:  No results found.  Assessment/Plan  1. Candidal skin infection -  will start on Nystatin ointment to rashes -  keep skin clean and dry  2. Slow transit constipation - sennosides-docusate sodium (SENOKOT-S) 8.6-50 MG tablet; Take 2 tablets by  mouth daily.  Dispense: 60 tablet; Refill: 3  3. Primary parkinsonism -  continue Carbidopa-Levodopa -  fall precautions   Family/ staff Communication: Discussed plan of care with resident and charge nurse.  Labs/tests ordered:  None    Durenda Age, DNP, MSN, FNP-BC [facility] [phone] (Monday-Friday 8:00 a.m. - 5:00 p.m.) [phone] (after hours)

## 2022-03-03 DIAGNOSIS — R262 Difficulty in walking, not elsewhere classified: Secondary | ICD-10-CM | POA: Diagnosis not present

## 2022-03-03 DIAGNOSIS — M6281 Muscle weakness (generalized): Secondary | ICD-10-CM | POA: Diagnosis not present

## 2022-03-03 DIAGNOSIS — R2689 Other abnormalities of gait and mobility: Secondary | ICD-10-CM | POA: Diagnosis not present

## 2022-03-03 DIAGNOSIS — R1312 Dysphagia, oropharyngeal phase: Secondary | ICD-10-CM | POA: Diagnosis not present

## 2022-03-03 DIAGNOSIS — R471 Dysarthria and anarthria: Secondary | ICD-10-CM | POA: Diagnosis not present

## 2022-03-03 MED ORDER — SENNA-DOCUSATE SODIUM 8.6-50 MG PO TABS: 2.0000 | ORAL_TABLET | Freq: Every day | ORAL | 3 refills | Status: DC

## 2022-03-05 DIAGNOSIS — M6281 Muscle weakness (generalized): Secondary | ICD-10-CM | POA: Diagnosis not present

## 2022-03-05 DIAGNOSIS — R1312 Dysphagia, oropharyngeal phase: Secondary | ICD-10-CM | POA: Diagnosis not present

## 2022-03-05 DIAGNOSIS — R262 Difficulty in walking, not elsewhere classified: Secondary | ICD-10-CM | POA: Diagnosis not present

## 2022-03-05 DIAGNOSIS — R2689 Other abnormalities of gait and mobility: Secondary | ICD-10-CM | POA: Diagnosis not present

## 2022-03-05 DIAGNOSIS — R471 Dysarthria and anarthria: Secondary | ICD-10-CM | POA: Diagnosis not present

## 2022-03-06 DIAGNOSIS — R262 Difficulty in walking, not elsewhere classified: Secondary | ICD-10-CM | POA: Diagnosis not present

## 2022-03-06 DIAGNOSIS — R2689 Other abnormalities of gait and mobility: Secondary | ICD-10-CM | POA: Diagnosis not present

## 2022-03-06 DIAGNOSIS — M6281 Muscle weakness (generalized): Secondary | ICD-10-CM | POA: Diagnosis not present

## 2022-03-06 DIAGNOSIS — R1312 Dysphagia, oropharyngeal phase: Secondary | ICD-10-CM | POA: Diagnosis not present

## 2022-03-06 DIAGNOSIS — R471 Dysarthria and anarthria: Secondary | ICD-10-CM | POA: Diagnosis not present

## 2022-03-07 DIAGNOSIS — R2689 Other abnormalities of gait and mobility: Secondary | ICD-10-CM | POA: Diagnosis not present

## 2022-03-07 DIAGNOSIS — M6281 Muscle weakness (generalized): Secondary | ICD-10-CM | POA: Diagnosis not present

## 2022-03-07 DIAGNOSIS — R471 Dysarthria and anarthria: Secondary | ICD-10-CM | POA: Diagnosis not present

## 2022-03-07 DIAGNOSIS — R262 Difficulty in walking, not elsewhere classified: Secondary | ICD-10-CM | POA: Diagnosis not present

## 2022-03-07 DIAGNOSIS — R1312 Dysphagia, oropharyngeal phase: Secondary | ICD-10-CM | POA: Diagnosis not present

## 2022-03-10 ENCOUNTER — Non-Acute Institutional Stay (SKILLED_NURSING_FACILITY): Payer: Medicare PPO | Admitting: Nurse Practitioner

## 2022-03-10 ENCOUNTER — Encounter: Payer: Self-pay | Admitting: Nurse Practitioner

## 2022-03-10 DIAGNOSIS — M159 Polyosteoarthritis, unspecified: Secondary | ICD-10-CM

## 2022-03-10 DIAGNOSIS — S60221A Contusion of right hand, initial encounter: Secondary | ICD-10-CM

## 2022-03-10 DIAGNOSIS — K219 Gastro-esophageal reflux disease without esophagitis: Secondary | ICD-10-CM | POA: Diagnosis not present

## 2022-03-10 DIAGNOSIS — R2689 Other abnormalities of gait and mobility: Secondary | ICD-10-CM | POA: Diagnosis not present

## 2022-03-10 DIAGNOSIS — I872 Venous insufficiency (chronic) (peripheral): Secondary | ICD-10-CM | POA: Diagnosis not present

## 2022-03-10 DIAGNOSIS — K5901 Slow transit constipation: Secondary | ICD-10-CM

## 2022-03-10 DIAGNOSIS — I480 Paroxysmal atrial fibrillation: Secondary | ICD-10-CM | POA: Diagnosis not present

## 2022-03-10 DIAGNOSIS — G20C Parkinsonism, unspecified: Secondary | ICD-10-CM

## 2022-03-10 DIAGNOSIS — R262 Difficulty in walking, not elsewhere classified: Secondary | ICD-10-CM | POA: Diagnosis not present

## 2022-03-10 DIAGNOSIS — N3941 Urge incontinence: Secondary | ICD-10-CM

## 2022-03-10 DIAGNOSIS — I739 Peripheral vascular disease, unspecified: Secondary | ICD-10-CM

## 2022-03-10 DIAGNOSIS — M150 Primary generalized (osteo)arthritis: Secondary | ICD-10-CM

## 2022-03-10 DIAGNOSIS — S90222A Contusion of left lesser toe(s) with damage to nail, initial encounter: Secondary | ICD-10-CM

## 2022-03-10 DIAGNOSIS — R471 Dysarthria and anarthria: Secondary | ICD-10-CM | POA: Diagnosis not present

## 2022-03-10 DIAGNOSIS — N390 Urinary tract infection, site not specified: Secondary | ICD-10-CM

## 2022-03-10 DIAGNOSIS — G903 Multi-system degeneration of the autonomic nervous system: Secondary | ICD-10-CM

## 2022-03-10 DIAGNOSIS — M6281 Muscle weakness (generalized): Secondary | ICD-10-CM | POA: Diagnosis not present

## 2022-03-10 DIAGNOSIS — D500 Iron deficiency anemia secondary to blood loss (chronic): Secondary | ICD-10-CM

## 2022-03-10 DIAGNOSIS — R1312 Dysphagia, oropharyngeal phase: Secondary | ICD-10-CM | POA: Diagnosis not present

## 2022-03-10 NOTE — Progress Notes (Unsigned)
Location:  [facility] Room Number: NO/13/A Place of Service:  SNF (31) Provider:  [place] Jr X, NP  Patient Care Team: [name], MD as PCP - General (Internal Medicine) [name], [name], MD as PCP - Cardiology (Cardiology) [name], [name], DO as Consulting Physician (Neurology) Kiran Lapine X, NP as Nurse Practitioner (Internal Medicine)  Extended Emergency Contact Information Primary Emergency Contact: Kursten, Finnegan Mobile Phone: [phone] Relation: Daughter Interpreter needed? No Secondary Emergency Contact: [facility] Address: [address]          [place], [place] [address] [name] of [place] Phone: [phone] Mobile Phone: [phone] Relation: Son  Code Status:  DNR Goals of care: Advanced Directive information    02/27/2022   11:17 AM  Advanced Directives  Does Patient Have a Medical Advance Directive? Yes  Type of Paramedic of [place];Out of facility DNR (pink MOST or yellow form)  Does patient want to make changes to medical advance directive? No - Patient declined  Copy of Rule in Chart? Yes - validated most recent copy scanned in chart (See row information)  Pre-existing out of facility DNR order (yellow form or pink MOST form) Yellow form placed in chart (order not valid for inpatient use)     Chief Complaint  Patient presents with   Acute Visit    Patient is being seen for hematoma of right hand and black left great  toe nail    HPI:  Pt is a 82 y.o. female seen today for an acute visit for black and tender left great toe nail, dorsum right hand hematoma, able to make a fist.      Anxiety, chronic, situational             PAD 12/25/20 ABI R+L 0.7, moderate arterial disease, vascular specialist if desires.              GERD, takes Protonix, Simethicone.              The patient has chronic edema BLE. Off Metolazone 09/27/19. Echo EF normal in the past.               Afib, heart rate is in control, on Xarelto, off Diltiazem. Hgb 11.8 11/22/20, TSH 1.30 03/29/20             OA,  on Tylenol, Methocarbamol, shoulder pain, f/u Ortho Parkinson's, symptoms improved, on Sinemet, Comtan, started Neupro 05/08/21,  f/u neurology at [facility]. double vision, chronic, saw Ophthalmology              Orthostatic hypotension, takes Midodrine.              Anemia, stable, Hgb 11.8 11/22/20,  takes Fe             Urge incontinent of urine, takes Mirabegron, needs urinate q3-4 hours to avoid bladder discomfort.              Recurrent UTI, saw Urology, placed on Estrace x2/wk, Nitrofurantoin 189m daily             Constipation, takes MiraLax  Past Medical History:  Diagnosis Date   Acute lower UTI 09/14/2018   Anemia 10/15/2018   2016 colonoscopy 10/14/18 wbc 5.4, Hgb 10.1, plt 184 12/01/18 wbc 4.5, Hgb 11.1, plt 188, neutrophils 63,  Na 139, K 3.7, Bun 23, creat 0.69, eGFR 83 on Fe, Hgb 11.7 12/09/18    Atrial fibrillation ([place] 05/21/2010  10/07/18 Na 143, K 4.0, Bun 20, creat 0.79, eGFR 72, wbc 7.1, Hgb 10.9, plt 172 10/28/18 Na 142, K 4.0, Bun 19, creat 0.77, eGFR 74 11/18/18 Na 144, K 4.1, Bun 21, creat 0.69, eGFR 83   Bilateral lower extremity edema 07/19/2018   11/02/18 BMP 2 weeks.    Cervical pain (neck) 06/20/2010   Closed fracture of part of upper end of humerus 05/01/2015   Colles' fracture of right radius 03/05/2015   Constipation 07/19/2018   DEGENERATIVE JOINT DISEASE, RIGHT HIP 02/16/2007   Dupuytren's contracture    Dysuria 09/20/2018   09/20/18 c/o got up several times last night to go urinate, burning on urination, lower abd /back discomfort, but urinary frequency, leakage are not new. UA C/S, Pyridium 175m tid x 2 days.  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/2017   S/P breast biopsy, left    two o'clock position - benign   Toxic effect of venom(989.5) 07/27/2007   Tremor, unspecified 10/19/2015   Unstable gait 02/16/2017   Vaginal atrophy 10/19/2015   Venous insufficiency    Weakness 09/12/2018   Past Surgical History:  Procedure Laterality Date   BREAST BIOPSY Left    CATARACT EXTRACTION, BILATERAL      Allergies  Allergen Reactions   Doxycycline Other (See Comments)    Unknown   Sulfonamide Derivatives Other (See Comments)    Unknown     Outpatient Encounter Medications as of 03/10/2022  Medication Sig   acetaminophen (TYLENOL) 500 MG tablet Take 1,000 mg by mouth at bedtime. 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   carbidopa-levodopa (PARCOPA) 25-100 MG disintegrating tablet Take 2 tablets by mouth 5 (five) times daily.   carbidopa-levodopa (PARCOPA) 25-100 MG disintegrating tablet Take 1 tablet by mouth See admin instructions. May give one additional tablet daily in addition to scheduled dose as needed for increased symptoms related to Parkinson's. For taking at 11am if needed. Do not take after 1130 or within one hour of next dose.   carbidopa-levodopa (SINEMET CR) 50-200 MG tablet Take 1 tablet by mouth at bedtime.   Cholecalciferol (VITAMIN D3) 10 MCG (400 UNIT) CAPS Take 1 capsule by mouth daily.   Cranberry 450 MG TABS Take 1 tablet by mouth daily.   diclofenac Sodium (VOLTAREN) 1 % GEL Apply 2 g topically 3 (three) times daily as needed (Neck pain).   entacapone (COMTAN) 200 MG tablet Take 200 mg by  mouth 3 (three) times daily.   ferrous sulfate 325 (65 FE) MG tablet Take 325 mg by mouth every Monday, Wednesday, and Friday.   hydrocortisone (ANUSOL-HC) 2.5 % rectal cream Place 1 application. rectally daily as needed for hemorrhoids.   lactose free nutrition (BOOST) LIQD Take 237 mLs by mouth daily.   methocarbamol (ROBAXIN) 500 MG tablet Take 250  mg by mouth daily as needed for muscle spasms.   midodrine (PROAMATINE) 5 MG tablet Take 5 mg by mouth in the morning, at noon, and at bedtime.   mirabegron ER (MYRBETRIQ) 50 MG TB24 tablet Take 50 mg by mouth daily.   nitrofurantoin (MACRODANTIN) 100 MG capsule Take 100 mg by mouth daily. related to URINARY TRACT INFECTION   pantoprazole (PROTONIX) 40 MG tablet Take 40 mg by mouth daily.   polyethylene glycol (MIRALAX / GLYCOLAX) 17 g packet Take 17 g by mouth 2 (two) times daily.   polyvinyl alcohol (LIQUIFILM TEARS) 1.4 % ophthalmic solution Place 1 drop into both eyes 3 (three) times daily as needed for dry eyes.   rivaroxaban (XARELTO) 20 MG TABS tablet Take 20 mg by mouth daily with supper.   rotigotine (NEUPRO) 2 MG/24HR Place 1 patch onto the skin daily.   Saline (ARY NASAL MIST ALLERGY/SINUS) 2.65 % SOLN Place 1 spray into both nostrils in the morning and at bedtime.   sennosides-docusate sodium (SENOKOT-S) 8.6-50 MG tablet Take 2 tablets by mouth daily.   simethicone (MYLICON) 80 MG chewable tablet Chew 80 mg by mouth with breakfast, with lunch, and with evening meal.   sodium fluoride (PREVIDENT 5000 PLUS) 1.1 % CREA dental cream Place 1 application. onto teeth in the morning and at bedtime.   zinc oxide 20 % ointment Apply 1 application. topically as needed for irritation. Apply to buttocks after every incontinent episode and for redness   No facility-administered encounter medications on file as of 03/10/2022.    Review of Systems  Constitutional:  Negative for appetite change, fatigue and fever.  HENT:  Positive for hearing loss.  Negative for congestion, rhinorrhea and trouble swallowing.   Eyes:  Positive for visual disturbance.       C/o double vision  Respiratory:  Positive for shortness of breath. Negative for cough, chest tightness and wheezing.   Cardiovascular:  Positive for leg swelling.  Gastrointestinal:  Negative for abdominal pain and constipation.  Genitourinary:  Positive for frequency. Negative for dysuria, genital sores and urgency.       Bladder discomfort prior to urination after waited for over 4-5 hours.   Musculoskeletal:  Positive for arthralgias, back pain and gait problem. Negative for myalgias.       Lower back discomfort positional. Chronic shoulder pain L>R, full ROM  Skin:        Left great toe nail subungual hemorrhage, dorsum right hand hematoma.   Neurological:  Positive for tremors. Negative for speech difficulty, weakness and headaches.       Moves slow, fine tremor in fingers, burning sensation  in the R+ L great toes when touched by sheets, comes/goes  Psychiatric/Behavioral:  Negative for behavioral problems and sleep disturbance. The patient is nervous/anxious.        Feels anxious sometimes.     Immunization History  Administered Date(s) Administered   Influenza Split 10/20/2012   Influenza Whole 10/21/2006   Influenza, High Dose Seasonal PF 10/17/2016, 11/02/2019, 11/12/2021   Influenza-Unspecified 10/20/2013, 10/22/2017, 11/07/2020   Moderna Sars-Covid-2 Vaccination 01/22/2019, 02/19/2019, 11/29/2019, 06/19/2020   Pfizer Covid-19 Vaccine Bivalent Booster 38yr & up 10/10/2020, 11/21/2021   Pneumococcal Conjugate-13 01/31/2013   Pneumococcal Polysaccharide-23 01/20/2005, 11/24/2011   Td 01/21/2004   Tdap 03/16/2014   Zoster Recombinat (Shingrix) 01/27/2022, 01/27/2022   Zoster, Live 04/17/2009   Pertinent  Health Maintenance Due  Topic Date Due   INFLUENZA VACCINE  Completed   DEXA SCAN  Completed  COLONOSCOPY (Pts 45-34yr Insurance coverage will need to be  confirmed)  Discontinued      10/11/2021    8:48 AM 11/08/2021    3:36 PM 01/17/2022    9:02 AM 01/28/2022   11:54 AM 02/18/2022    9:59 AM  Fall Risk  Falls in the past year? 0 1 0 0 0  Was there an injury with Fall? 0 0 0 0 0  Fall Risk Category Calculator 0 1 0 0 0  Fall Risk Category (Retired) Low Low Low Low   (RETIRED) Patient Fall Risk Level Low fall risk Low fall risk Low fall risk Low fall risk   Patient at Risk for Falls Due to No Fall Risks No Fall Risks No Fall Risks No Fall Risks No Fall Risks  Fall risk Follow up Falls evaluation completed Falls evaluation completed Falls evaluation completed Falls evaluation completed Falls evaluation completed   Functional Status Survey:    Vitals:   03/10/22 1545  BP: (!) 90/51  Pulse: 70  Resp: 14  Temp: 97.6 F (36.4 C)  SpO2: 97%  Weight: 129 lb 8 oz (58.7 kg)  Height: 5' 8"$  (1.727 m)   Body mass index is 19.69 kg/m. Physical Exam Vitals and nursing note reviewed.  Constitutional:      Appearance: Normal appearance.  HENT:     Head: Normocephalic and atraumatic.     Nose: Nose normal.     Mouth/Throat:     Mouth: Mucous membranes are moist.  Eyes:     Extraocular Movements: Extraocular movements intact.     Conjunctiva/sclera: Conjunctivae normal.     Pupils: Pupils are equal, round, and reactive to light.     Comments: Chronic double vision, see Ophthalmology 09/20/20  Cardiovascular:     Rate and Rhythm: Normal rate. Rhythm irregular.     Heart sounds: No murmur heard.    Comments: DP pulses present L>R Pulmonary:     Effort: Pulmonary effort is normal.     Breath sounds: No rales.  Abdominal:     General: Bowel sounds are normal.     Palpations: Abdomen is soft.     Tenderness: There is no abdominal tenderness.  Musculoskeletal:     Cervical back: Normal range of motion and neck supple.     Right lower leg: Edema present.     Left lower leg: Edema present.     Comments: Trace -1+ edema BLE. Chronic lower  back, shoulder R+L pain.   Skin:    General: Skin is warm and dry.     Findings: Bruising present.     Comments: ABI showed moderate PAD Left great toe nail subungual hemorrhage, dorsum right hand hematoma.    Neurological:     General: No focal deficit present.     Mental Status: She is alert and oriented to person, place, and time. Mental status is at baseline.     Coordination: Coordination abnormal.     Gait: Gait abnormal.     Comments: Moves slow, tremor in hands. No facial or limb weakness.   Psychiatric:        Mood and Affect: Mood normal.        Behavior: Behavior normal.     Labs reviewed: Recent Labs    06/18/21 0000  NA 142  K 4.4  CL 108  CO2 27*  BUN 21  CREATININE 0.8  CALCIUM 9.3   Recent Labs    06/18/21 0000  AST 13  ALT 6*  ALKPHOS 48  ALBUMIN 4.0   Recent Labs    06/18/21 0000  WBC 4.4  NEUTROABS 2,803.00  HGB 11.7*  HCT 35*  PLT 159   Lab Results  Component Value Date   TSH 1.30 03/29/2020   No results found for: "HGBA1C" Lab Results  Component Value Date   CHOL 185 03/24/2019   HDL 79 (A) 03/24/2019   LDLCALC 94 03/24/2019   LDLDIRECT 104.2 01/25/2013   TRIG 41 03/24/2019   CHOLHDL 2 08/29/2015    Significant Diagnostic Results in last 30 days:  No results found.  Assessment/Plan Traumatic hematoma of right hand dorsum right hand hematoma, able to make a fist. Cold compress 25mn/each, qid x 72 hours.   Subungual hemorrhage of toenail, left, initial encounter The left great toe nail, black, tender, toe nail trimmed, it should heal.   PAD (peripheral artery disease) ([place] 12/25/20 ABI R+L 0.7, moderate arterial disease, vascular specialist if desires.   GERD (gastroesophageal reflux disease) takes Protonix, Simethicone.   Edema of left lower extremity due to peripheral venous insufficiency   The patient has chronic edema BLE. Off Metolazone 09/27/19. Echo EF normal in the past.   Atrial fibrillation (HCC) heart rate  is in control, on Xarelto, off Diltiazem. Hgb 11.8 11/22/20, TSH 1.30 03/29/20  Osteoarthritis, multiple sites on Tylenol, Methocarbamol, shoulder pain, f/u Ortho  Parkinsonism symptoms improved, on Sinemet, Comtan, started Neupro 05/08/21,  [facility], [facility], saw Ophthalmology   Hypotension  takes Midodrine, low Bp sometimes.   Anemia stable, Hgb 11.8 11/22/20,  takes Fe  Urge incontinence of urine  takes Mirabegron, needs urinate q3-4 hours to avoid bladder discomfort.   Chronic UTI saw Urology, placed on Estrace x2/wk, Nitrofurantoin 1025mdaily for UTI supression    Family/ staff Communication: plan of care reviewed with the patient and charge nurse.   Labs/tests ordered: none  Time spend 35 minutes.

## 2022-03-11 ENCOUNTER — Non-Acute Institutional Stay (SKILLED_NURSING_FACILITY): Payer: Medicare PPO | Admitting: Family Medicine

## 2022-03-11 ENCOUNTER — Encounter: Payer: Self-pay | Admitting: Nurse Practitioner

## 2022-03-11 DIAGNOSIS — S60221A Contusion of right hand, initial encounter: Secondary | ICD-10-CM | POA: Insufficient documentation

## 2022-03-11 DIAGNOSIS — R1310 Dysphagia, unspecified: Secondary | ICD-10-CM

## 2022-03-11 DIAGNOSIS — I872 Venous insufficiency (chronic) (peripheral): Secondary | ICD-10-CM

## 2022-03-11 DIAGNOSIS — G903 Multi-system degeneration of the autonomic nervous system: Secondary | ICD-10-CM

## 2022-03-11 DIAGNOSIS — I480 Paroxysmal atrial fibrillation: Secondary | ICD-10-CM

## 2022-03-11 DIAGNOSIS — N390 Urinary tract infection, site not specified: Secondary | ICD-10-CM | POA: Diagnosis not present

## 2022-03-11 DIAGNOSIS — R471 Dysarthria and anarthria: Secondary | ICD-10-CM | POA: Diagnosis not present

## 2022-03-11 DIAGNOSIS — S90222A Contusion of left lesser toe(s) with damage to nail, initial encounter: Secondary | ICD-10-CM | POA: Insufficient documentation

## 2022-03-11 DIAGNOSIS — M6281 Muscle weakness (generalized): Secondary | ICD-10-CM | POA: Diagnosis not present

## 2022-03-11 DIAGNOSIS — R1312 Dysphagia, oropharyngeal phase: Secondary | ICD-10-CM | POA: Diagnosis not present

## 2022-03-11 DIAGNOSIS — G20C Parkinsonism, unspecified: Secondary | ICD-10-CM

## 2022-03-11 DIAGNOSIS — R262 Difficulty in walking, not elsewhere classified: Secondary | ICD-10-CM | POA: Diagnosis not present

## 2022-03-11 DIAGNOSIS — R2689 Other abnormalities of gait and mobility: Secondary | ICD-10-CM | POA: Diagnosis not present

## 2022-03-11 NOTE — Assessment & Plan Note (Signed)
symptoms improved, on Sinemet, Comtan, started Neupro 05/08/21,  f/u neurology at [facility]. double vision, chronic, saw Ophthalmology

## 2022-03-11 NOTE — Assessment & Plan Note (Signed)
takes Midodrine, low Bp sometimes.

## 2022-03-11 NOTE — Assessment & Plan Note (Signed)
The left great toe nail, black, tender, toe nail trimmed, it should heal.

## 2022-03-11 NOTE — Assessment & Plan Note (Signed)
takes Mirabegron, needs urinate q3-4 hours to avoid bladder discomfort.

## 2022-03-11 NOTE — Assessment & Plan Note (Signed)
stable, Hgb 11.8 11/22/20,  takes Fe

## 2022-03-11 NOTE — Assessment & Plan Note (Signed)
saw Urology, placed on Estrace x2/wk, Nitrofurantoin 163m daily for UTI supression

## 2022-03-11 NOTE — Assessment & Plan Note (Signed)
12/25/20 ABI R+L 0.7, moderate arterial disease, vascular specialist if desires.

## 2022-03-11 NOTE — Assessment & Plan Note (Signed)
on Tylenol, Methocarbamol, shoulder pain, f/u Ortho

## 2022-03-11 NOTE — Assessment & Plan Note (Signed)
dorsum right hand hematoma, able to make a fist. Cold compress 26mn/each, qid x 72 hours.

## 2022-03-11 NOTE — Assessment & Plan Note (Signed)
takes MiraLax

## 2022-03-11 NOTE — Assessment & Plan Note (Signed)
takes Protonix, Simethicone.

## 2022-03-11 NOTE — Assessment & Plan Note (Signed)
heart rate is in control, on Xarelto, off Diltiazem. Hgb 11.8 11/22/20, TSH 1.30 03/29/20

## 2022-03-11 NOTE — Assessment & Plan Note (Signed)
The patient has chronic edema BLE. Off Metolazone 09/27/19. Echo EF normal in the past.

## 2022-03-11 NOTE — Progress Notes (Signed)
Provider:  Alain Honey, MD Location:      Place of Service:     PCP: [name], MD Patient Care Team: [name], MD as PCP - General (Internal Medicine) [name], [name], MD as PCP - Cardiology (Cardiology) [name], [name], DO as Consulting Physician (Neurology) Mast, Man X, NP as Nurse Practitioner (Internal Medicine)  Extended Emergency Contact Information Primary Emergency Contact: Omani, Abdulaziz Mobile Phone: [phone] Relation: Daughter Interpreter needed? No Secondary Emergency Contact: [facility] Address: [address]          [place],  [address] [name] of [place] Phone: [phone] Mobile Phone: [phone] Relation: Son  Code Status:  Goals of Care: Advanced Directive information    02/27/2022   11:17 AM  Advanced Directives  Does Patient Have a Medical Advance Directive? Yes  Type of Paramedic of [place];Out of facility DNR (pink MOST or yellow form)  Does patient want to make changes to medical advance directive? No - Patient declined  Copy of [place] in Chart? Yes - validated most recent copy scanned in chart (See row information)  Pre-existing out of facility DNR order (yellow form or pink MOST form) Yellow form placed in chart (order not valid for inpatient use)      No chief complaint on file.   HPI: Patient is a 82 y.o. female seen today for medical management of chronic problems including Parkinson's disease, paroxysmal atrial fibrillation, recurring UTI, the patient, orthostatic hypotension.  This patient stays in her room most of the time with minimal attempts at socialization.  She does have some acute issues today including what appears to be a subungual hematoma on her left great toenail.  There is no history of any injury or trauma to have caused this. She denies dysphagia or choking when she eats or drinks.  She endorses not drinking enough water but says one of  the reasons is her water cup has disappeared (I requested another 1 for her).  Her Parkinson symptoms are managed on Sinemet and Comtan and Neupro.  She is followed by neurology at [facility]. For her recurring UTIs she is on Macrodantin prophylactically but still developed a UTI recently and was treated with Augmentin.  Sensitivities on the culture showed that the organism was sensitive to nitrofurantoin yet she still developed infection; I cannot explain this She remains with paroxysmal atrial fib.  She is on Xarelto.  She had some reservations about a recent fillingof a tooth and stop Xarelto for 2 days prior to the feeling.  She is off rate controlling medications having previously been on diltiazem She has struggled with constipation and formally took MiraLAX but now symptoms are better treated with Senokot  Past Medical History:  Diagnosis Date   Acute lower UTI 09/14/2018   Anemia 10/15/2018   2016 colonoscopy 10/14/18 wbc 5.4, Hgb 10.1, plt 184 12/01/18 wbc 4.5, Hgb 11.1, plt 188, neutrophils 63,  Na 139, K 3.7, Bun 23, creat 0.69, eGFR 83 on Fe, Hgb 11.7 12/09/18    Atrial fibrillation ([place]) 05/21/2010   10/07/18 Na 143, K 4.0, Bun 20, creat 0.79, eGFR 72, wbc 7.1, Hgb 10.9, plt 172 10/28/18 Na 142, K 4.0, Bun 19, creat 0.77, eGFR 74 11/18/18 Na 144, K 4.1, Bun 21, creat 0.69, eGFR 83   Bilateral lower extremity edema 07/19/2018   11/02/18 BMP 2 weeks.    Cervical pain (neck) 06/20/2010   Closed fracture of part of upper end  of humerus 05/01/2015   Colles' fracture of right radius 03/05/2015   Constipation 07/19/2018   DEGENERATIVE JOINT DISEASE, RIGHT HIP 02/16/2007   Dupuytren's contracture    Dysuria 09/20/2018   09/20/18 c/o got up several times last night to go urinate, burning on urination, lower abd /back discomfort, but urinary frequency, leakage are not new. UA C/S, Pyridium 131m tid x 2 days.  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 Her smoking use included cigarettes. She has never used smokeless tobacco. She reports that she does not drink alcohol and does not use drugs. Social History   Socioeconomic History   Marital status: Divorced    Spouse name: Not on file   Number of children: 2   Years of education: Not on file   Highest education level: Master's degree (e.g., MA, MS, MEng, MEd, MSW, MBA)  Occupational History   Occupation: retired    Comment: math, [employer]  Tobacco Use   Smoking status: Former    Types: Cigarettes    Quit date: 08/27/1971    Years since quitting: 50.5   Smokeless tobacco: Never  Vaping Use   Vaping Use: Never used  Substance and Sexual Activity   Alcohol use: No    Alcohol/week: 0.0 standard drinks of alcohol   Drug use: No   Sexual activity: Not Currently    Comment: 1st intercourse 82 yo-Fewer than 5 partners  Other Topics Concern   Not on file  Social History Narrative   Not on file   Social Determinants of Health   Financial Resource Strain: Not on file  Food Insecurity: Not on file  Transportation Needs: Not on file  Physical Activity: Not on file  Stress: Not on file  Social Connections:  Not on file  Intimate Partner Violence: Not on file    Functional Status Survey:    Family History  Problem Relation Age of Onset   Cancer Father        ? type   Alcohol abuse Father    Cancer Mother        Pancreatic   Diabetes Mother    Heart failure Mother    Osteoarthritis Sister    Healthy Sister    Healthy Child    Breast cancer Neg Hx     Health Maintenance  Topic Date Due   COVID-19 Vaccine (7 - 2023-24 season) 04/21/2022 (Originally 01/16/2022)   Zoster Vaccines- Shingrix (2 of 2) 03/24/2022   Medicare Annual Wellness (AWV)  01/29/2023   DTaP/Tdap/Td (3 - Td or Tdap) 03/16/2024  Pneumonia Vaccine 90+ Years old  Completed   INFLUENZA VACCINE  Completed   DEXA SCAN  Completed   HPV VACCINES  Aged Out   COLONOSCOPY (Pts 45-69yr Insurance coverage will need to be confirmed)  Discontinued    Allergies  Allergen Reactions   Doxycycline Other (See Comments)    Unknown   Sulfonamide Derivatives Other (See Comments)    Unknown     Outpatient Encounter Medications as of 03/11/2022  Medication Sig   acetaminophen (TYLENOL) 500 MG tablet Take 1,000 mg by mouth at bedtime. 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   carbidopa-levodopa (PARCOPA) 25-100 MG disintegrating tablet Take 2 tablets by mouth 5 (five) times daily.   carbidopa-levodopa (PARCOPA) 25-100 MG disintegrating tablet Take 1 tablet by mouth See admin instructions. May give one additional tablet daily in addition to scheduled dose as needed for increased symptoms related to Parkinson's. For taking at 11am if needed. Do not take after 1130 or within one hour of next dose.   carbidopa-levodopa (SINEMET CR) 50-200 MG tablet Take 1 tablet by mouth at bedtime.   Cholecalciferol (VITAMIN D3) 10 MCG (400 UNIT) CAPS  Take 1 capsule by mouth daily.   Cranberry 450 MG TABS Take 1 tablet by mouth daily.   diclofenac Sodium (VOLTAREN) 1 % GEL Apply 2 g topically 3 (three) times daily as needed (Neck pain).   entacapone (COMTAN) 200 MG tablet Take 200 mg by mouth 3 (three) times daily.   ferrous sulfate 325 (65 FE) MG tablet Take 325 mg by mouth every Monday, Wednesday, and Friday.   hydrocortisone (ANUSOL-HC) 2.5 % rectal cream Place 1 application. rectally daily as needed for hemorrhoids.   lactose free nutrition (BOOST) LIQD Take 237 mLs by mouth daily.   methocarbamol (ROBAXIN) 500 MG tablet Take 250 mg by mouth daily as needed for muscle spasms.   midodrine (PROAMATINE) 5 MG tablet Take 5 mg by mouth in the morning, at noon, and at bedtime.   mirabegron ER (MYRBETRIQ) 50 MG TB24 tablet Take 50 mg by mouth daily.   nitrofurantoin (MACRODANTIN) 100 MG capsule Take 100 mg by mouth daily. related to URINARY TRACT INFECTION   pantoprazole (PROTONIX) 40 MG tablet Take 40 mg by mouth daily.   polyethylene glycol (MIRALAX / GLYCOLAX) 17 g packet Take 17 g by mouth 2 (two) times daily.   polyvinyl alcohol (LIQUIFILM TEARS) 1.4 % ophthalmic solution Place 1 drop into both eyes 3 (three) times daily as needed for dry eyes.   rivaroxaban (XARELTO) 20 MG TABS tablet Take 20 mg by mouth daily with supper.   rotigotine (NEUPRO) 2 MG/24HR Place 1 patch onto the skin daily.   Saline (ARY NASAL MIST ALLERGY/SINUS) 2.65 % SOLN Place 1 spray into both nostrils in the morning and at bedtime.   sennosides-docusate sodium (SENOKOT-S) 8.6-50 MG tablet Take 2 tablets by mouth daily.   simethicone (MYLICON) 80 MG chewable tablet Chew 80 mg by mouth with breakfast, with lunch, and with evening meal.   sodium fluoride (PREVIDENT 5000 PLUS) 1.1 % CREA dental cream Place 1 application. onto teeth in the morning and at bedtime.   zinc oxide 20 % ointment Apply 1 application. topically as needed for irritation. Apply to buttocks after every  incontinent episode and for  redness   No facility-administered encounter medications on file as of 03/11/2022.    Review of Systems  Constitutional: Negative.   HENT: Negative.    Respiratory: Negative.  Negative for cough and choking.   Cardiovascular: Negative.   Genitourinary:  Positive for dysuria and frequency.  Neurological:  Positive for tremors.  Psychiatric/Behavioral:  The patient is nervous/anxious.   All other systems reviewed and are negative.   There were no vitals filed for this visit. There is no height or weight on file to calculate BMI. Physical Exam Vitals and nursing note reviewed.  Constitutional:      Appearance: Normal appearance.     Comments: Speech is a bit soft probably related to the Parkinson's  HENT:     Mouth/Throat:     Mouth: Mucous membranes are moist.     Pharynx: Oropharynx is clear.  Eyes:     Extraocular Movements: Extraocular movements intact.     Pupils: Pupils are equal, round, and reactive to light.  Cardiovascular:     Rate and Rhythm: Normal rate. Rhythm irregular.  Pulmonary:     Effort: Pulmonary effort is normal.     Breath sounds: Normal breath sounds.  Abdominal:     General: Bowel sounds are normal.     Palpations: Abdomen is soft.  Neurological:     General: No focal deficit present.     Mental Status: She is alert.  Psychiatric:        Mood and Affect: Mood normal.        Behavior: Behavior normal.     Labs reviewed: Basic Metabolic Panel: Recent Labs    06/18/21 0000  NA 142  K 4.4  CL 108  CO2 27*  BUN 21  CREATININE 0.8  CALCIUM 9.3   Liver Function Tests: Recent Labs    06/18/21 0000  AST 13  ALT 6*  ALKPHOS 48  ALBUMIN 4.0   No results for input(s): "LIPASE", "AMYLASE" in the last 8760 hours. No results for input(s): "AMMONIA" in the last 8760 hours. CBC: Recent Labs    06/18/21 0000  WBC 4.4  NEUTROABS 2,803.00  HGB 11.7*  HCT 35*  PLT 159   Cardiac Enzymes: No results for  input(s): "CKTOTAL", "CKMB", "CKMBINDEX", "TROPONINI" in the last 8760 hours. BNP: Invalid input(s): "POCBNP" No results found for: "HGBA1C" Lab Results  Component Value Date   TSH 1.30 03/29/2020   Lab Results  Component Value Date   VITAMINB12 1,217 (H) 09/12/2018   No results found for: "FOLATE" No results found for: "IRON", "TIBC", "FERRITIN"  Imaging and Procedures obtained prior to SNF admission: No results found.  Assessment/Plan 1. Paroxysmal atrial fibrillation (HCC) Continue with Xarelto.  Will monitor heart rate  2. Chronic UTI Continue with nitrofurantoin as a prophylactic encourage more fluid intake  3. Dysphagia, unspecified type Denies problems.  Continues with regular diet and thin liquids  4. Edema of left lower extremity due to peripheral venous insufficiency No current diuretic therapy  5. Neurogenic orthostatic hypotension (HCC) Related to Parkinson's symptoms are controlled with midodrine  6. Primary parkinsonism By neurology at [facility] on Sinemet as well as Par[place]opa and Neupro    Family/ staff Communication:   Labs/tests ordered:  Lillette Boxer. Sabra Heck, [place] [address] [place], [place] Office [phone]

## 2022-03-12 DIAGNOSIS — M6281 Muscle weakness (generalized): Secondary | ICD-10-CM | POA: Diagnosis not present

## 2022-03-12 DIAGNOSIS — R471 Dysarthria and anarthria: Secondary | ICD-10-CM | POA: Diagnosis not present

## 2022-03-12 DIAGNOSIS — R262 Difficulty in walking, not elsewhere classified: Secondary | ICD-10-CM | POA: Diagnosis not present

## 2022-03-12 DIAGNOSIS — R2689 Other abnormalities of gait and mobility: Secondary | ICD-10-CM | POA: Diagnosis not present

## 2022-03-12 DIAGNOSIS — R1312 Dysphagia, oropharyngeal phase: Secondary | ICD-10-CM | POA: Diagnosis not present

## 2022-03-13 DIAGNOSIS — R471 Dysarthria and anarthria: Secondary | ICD-10-CM | POA: Diagnosis not present

## 2022-03-13 DIAGNOSIS — R1312 Dysphagia, oropharyngeal phase: Secondary | ICD-10-CM | POA: Diagnosis not present

## 2022-03-13 DIAGNOSIS — M6281 Muscle weakness (generalized): Secondary | ICD-10-CM | POA: Diagnosis not present

## 2022-03-13 DIAGNOSIS — R262 Difficulty in walking, not elsewhere classified: Secondary | ICD-10-CM | POA: Diagnosis not present

## 2022-03-13 DIAGNOSIS — R2689 Other abnormalities of gait and mobility: Secondary | ICD-10-CM | POA: Diagnosis not present

## 2022-03-17 DIAGNOSIS — R262 Difficulty in walking, not elsewhere classified: Secondary | ICD-10-CM | POA: Diagnosis not present

## 2022-03-17 DIAGNOSIS — R2689 Other abnormalities of gait and mobility: Secondary | ICD-10-CM | POA: Diagnosis not present

## 2022-03-17 DIAGNOSIS — R1312 Dysphagia, oropharyngeal phase: Secondary | ICD-10-CM | POA: Diagnosis not present

## 2022-03-17 DIAGNOSIS — R471 Dysarthria and anarthria: Secondary | ICD-10-CM | POA: Diagnosis not present

## 2022-03-17 DIAGNOSIS — M6281 Muscle weakness (generalized): Secondary | ICD-10-CM | POA: Diagnosis not present

## 2022-03-18 DIAGNOSIS — L84 Corns and callosities: Secondary | ICD-10-CM | POA: Diagnosis not present

## 2022-03-18 DIAGNOSIS — R471 Dysarthria and anarthria: Secondary | ICD-10-CM | POA: Diagnosis not present

## 2022-03-18 DIAGNOSIS — R1312 Dysphagia, oropharyngeal phase: Secondary | ICD-10-CM | POA: Diagnosis not present

## 2022-03-18 DIAGNOSIS — R2689 Other abnormalities of gait and mobility: Secondary | ICD-10-CM | POA: Diagnosis not present

## 2022-03-18 DIAGNOSIS — R262 Difficulty in walking, not elsewhere classified: Secondary | ICD-10-CM | POA: Diagnosis not present

## 2022-03-18 DIAGNOSIS — M6281 Muscle weakness (generalized): Secondary | ICD-10-CM | POA: Diagnosis not present

## 2022-03-18 DIAGNOSIS — L602 Onychogryphosis: Secondary | ICD-10-CM | POA: Diagnosis not present

## 2022-03-19 DIAGNOSIS — M6281 Muscle weakness (generalized): Secondary | ICD-10-CM | POA: Diagnosis not present

## 2022-03-19 DIAGNOSIS — R262 Difficulty in walking, not elsewhere classified: Secondary | ICD-10-CM | POA: Diagnosis not present

## 2022-03-19 DIAGNOSIS — R471 Dysarthria and anarthria: Secondary | ICD-10-CM | POA: Diagnosis not present

## 2022-03-19 DIAGNOSIS — R2689 Other abnormalities of gait and mobility: Secondary | ICD-10-CM | POA: Diagnosis not present

## 2022-03-19 DIAGNOSIS — R1312 Dysphagia, oropharyngeal phase: Secondary | ICD-10-CM | POA: Diagnosis not present

## 2022-03-21 DIAGNOSIS — R471 Dysarthria and anarthria: Secondary | ICD-10-CM | POA: Diagnosis not present

## 2022-03-21 DIAGNOSIS — M6281 Muscle weakness (generalized): Secondary | ICD-10-CM | POA: Diagnosis not present

## 2022-03-21 DIAGNOSIS — R1312 Dysphagia, oropharyngeal phase: Secondary | ICD-10-CM | POA: Diagnosis not present

## 2022-03-21 DIAGNOSIS — R2689 Other abnormalities of gait and mobility: Secondary | ICD-10-CM | POA: Diagnosis not present

## 2022-03-21 DIAGNOSIS — R262 Difficulty in walking, not elsewhere classified: Secondary | ICD-10-CM | POA: Diagnosis not present

## 2022-03-24 DIAGNOSIS — R262 Difficulty in walking, not elsewhere classified: Secondary | ICD-10-CM | POA: Diagnosis not present

## 2022-03-24 DIAGNOSIS — R1312 Dysphagia, oropharyngeal phase: Secondary | ICD-10-CM | POA: Diagnosis not present

## 2022-03-24 DIAGNOSIS — R471 Dysarthria and anarthria: Secondary | ICD-10-CM | POA: Diagnosis not present

## 2022-03-24 DIAGNOSIS — R2689 Other abnormalities of gait and mobility: Secondary | ICD-10-CM | POA: Diagnosis not present

## 2022-03-24 DIAGNOSIS — M6281 Muscle weakness (generalized): Secondary | ICD-10-CM | POA: Diagnosis not present

## 2022-03-25 DIAGNOSIS — R471 Dysarthria and anarthria: Secondary | ICD-10-CM | POA: Diagnosis not present

## 2022-03-25 DIAGNOSIS — R262 Difficulty in walking, not elsewhere classified: Secondary | ICD-10-CM | POA: Diagnosis not present

## 2022-03-25 DIAGNOSIS — R1312 Dysphagia, oropharyngeal phase: Secondary | ICD-10-CM | POA: Diagnosis not present

## 2022-03-25 DIAGNOSIS — M6281 Muscle weakness (generalized): Secondary | ICD-10-CM | POA: Diagnosis not present

## 2022-03-25 DIAGNOSIS — R2689 Other abnormalities of gait and mobility: Secondary | ICD-10-CM | POA: Diagnosis not present

## 2022-03-26 DIAGNOSIS — R471 Dysarthria and anarthria: Secondary | ICD-10-CM | POA: Diagnosis not present

## 2022-03-26 DIAGNOSIS — M6281 Muscle weakness (generalized): Secondary | ICD-10-CM | POA: Diagnosis not present

## 2022-03-26 DIAGNOSIS — R262 Difficulty in walking, not elsewhere classified: Secondary | ICD-10-CM | POA: Diagnosis not present

## 2022-03-26 DIAGNOSIS — R2689 Other abnormalities of gait and mobility: Secondary | ICD-10-CM | POA: Diagnosis not present

## 2022-03-26 DIAGNOSIS — R1312 Dysphagia, oropharyngeal phase: Secondary | ICD-10-CM | POA: Diagnosis not present

## 2022-03-31 DIAGNOSIS — R2689 Other abnormalities of gait and mobility: Secondary | ICD-10-CM | POA: Diagnosis not present

## 2022-03-31 DIAGNOSIS — M6281 Muscle weakness (generalized): Secondary | ICD-10-CM | POA: Diagnosis not present

## 2022-03-31 DIAGNOSIS — R262 Difficulty in walking, not elsewhere classified: Secondary | ICD-10-CM | POA: Diagnosis not present

## 2022-03-31 DIAGNOSIS — R471 Dysarthria and anarthria: Secondary | ICD-10-CM | POA: Diagnosis not present

## 2022-03-31 DIAGNOSIS — R1312 Dysphagia, oropharyngeal phase: Secondary | ICD-10-CM | POA: Diagnosis not present

## 2022-04-01 ENCOUNTER — Non-Acute Institutional Stay (SKILLED_NURSING_FACILITY): Payer: Medicare PPO | Admitting: Nurse Practitioner

## 2022-04-01 ENCOUNTER — Encounter: Payer: Self-pay | Admitting: Nurse Practitioner

## 2022-04-01 DIAGNOSIS — N3941 Urge incontinence: Secondary | ICD-10-CM

## 2022-04-01 DIAGNOSIS — R1312 Dysphagia, oropharyngeal phase: Secondary | ICD-10-CM | POA: Diagnosis not present

## 2022-04-01 DIAGNOSIS — R471 Dysarthria and anarthria: Secondary | ICD-10-CM | POA: Diagnosis not present

## 2022-04-01 DIAGNOSIS — I480 Paroxysmal atrial fibrillation: Secondary | ICD-10-CM

## 2022-04-01 DIAGNOSIS — L989 Disorder of the skin and subcutaneous tissue, unspecified: Secondary | ICD-10-CM | POA: Diagnosis not present

## 2022-04-01 DIAGNOSIS — R262 Difficulty in walking, not elsewhere classified: Secondary | ICD-10-CM | POA: Diagnosis not present

## 2022-04-01 DIAGNOSIS — I872 Venous insufficiency (chronic) (peripheral): Secondary | ICD-10-CM

## 2022-04-01 DIAGNOSIS — I739 Peripheral vascular disease, unspecified: Secondary | ICD-10-CM

## 2022-04-01 DIAGNOSIS — R2689 Other abnormalities of gait and mobility: Secondary | ICD-10-CM | POA: Diagnosis not present

## 2022-04-01 DIAGNOSIS — D500 Iron deficiency anemia secondary to blood loss (chronic): Secondary | ICD-10-CM

## 2022-04-01 DIAGNOSIS — G903 Multi-system degeneration of the autonomic nervous system: Secondary | ICD-10-CM

## 2022-04-01 DIAGNOSIS — K219 Gastro-esophageal reflux disease without esophagitis: Secondary | ICD-10-CM | POA: Diagnosis not present

## 2022-04-01 DIAGNOSIS — M159 Polyosteoarthritis, unspecified: Secondary | ICD-10-CM

## 2022-04-01 DIAGNOSIS — G20C Parkinsonism, unspecified: Secondary | ICD-10-CM

## 2022-04-01 DIAGNOSIS — M6281 Muscle weakness (generalized): Secondary | ICD-10-CM | POA: Diagnosis not present

## 2022-04-01 DIAGNOSIS — K5901 Slow transit constipation: Secondary | ICD-10-CM

## 2022-04-01 NOTE — Assessment & Plan Note (Signed)
Stable, takes MiraLax.  

## 2022-04-01 NOTE — Assessment & Plan Note (Signed)
PAD 12/25/20 ABI R+L 0.7, moderate arterial disease, vascular specialist if desires.  

## 2022-04-01 NOTE — Assessment & Plan Note (Signed)
Parkinson's, symptoms improved, on Sinemet, Comtan, started Neupro 05/08/21,  f/u neurology at [facility]. double vision, chronic, saw Ophthalmology  

## 2022-04-01 NOTE — Assessment & Plan Note (Signed)
Blood pressure is maintained, takes Midodrine.

## 2022-04-01 NOTE — Progress Notes (Signed)
Location:   SNF [place] Room Number: 13 Place of Service:  SNF (31) Provider: [facility] Noami Bove NP  [name], MD  Patient Care Team: [name], MD as PCP - General (Internal Medicine) [name], [name], MD as PCP - Cardiology (Cardiology) [name], [name], DO as Consulting Physician (Neurology) Arush Gatliff X, NP as Nurse Practitioner (Internal Medicine)  Extended Emergency Contact Information Primary Emergency Contact: Elva, Cuadrado Mobile Phone: [phone] Relation: Daughter Interpreter needed? No Secondary Emergency Contact: [facility] Address: [address]          [place], [place] [address] [name] of [place] Phone: [phone] Mobile Phone: [phone] Relation: Son  Code Status:  DNR Goals of care: Advanced Directive information    02/27/2022   11:17 AM  Advanced Directives  Does Patient Have a Medical Advance Directive? Yes  Type of Paramedic of [place];Out of facility DNR (pink MOST or yellow form)  Does patient want to make changes to medical advance directive? No - Patient declined  Copy of [place] in Chart? Yes - validated most recent copy scanned in chart (See row information)  Pre-existing out of facility DNR order (yellow form or pink MOST form) Yellow form placed in chart (order not valid for inpatient use)     Chief Complaint  Patient presents with   Medical Management of Chronic Issues    HPI:  Pt is a 82 y.o. female seen today for medical management of chronic diseases.     Right face small scabbed lesion, redness, mild warmth peri wound.    Anxiety, chronic, situational             PAD 12/25/20 ABI R+L 0.7, moderate arterial disease, vascular specialist if desires.              GERD, takes Protonix, Simethicone.              The patient has chronic edema BLE. Off Metolazone 09/27/19. Echo EF normal in the past.              Afib, heart rate is in control, on Xarelto, off  Diltiazem. Hgb 11.8 11/22/20, TSH 1.30 03/29/20             OA,  on Tylenol, Methocarbamol, shoulder pain, f/u Ortho Parkinson's, symptoms improved, on Sinemet, Comtan, started Neupro 05/08/21,  f/u neurology at [facility]. double vision, chronic, saw Ophthalmology              Orthostatic hypotension, takes Midodrine.              Anemia, stable, Hgb 11.8 11/22/20,  takes Fe             Urge incontinent of urine, takes Mirabegron, needs urinate q3-4 hours to avoid bladder discomfort.              Recurrent UTI, saw Urology, placed on Estrace x2/wk, Nitrofurantoin '100mg'$  daily             Constipation, takes MiraLax    Past Medical History:  Diagnosis Date   Acute lower UTI 09/14/2018   Anemia 10/15/2018   2016 colonoscopy 10/14/18 wbc 5.4, Hgb 10.1, plt 184 12/01/18 wbc 4.5, Hgb 11.1, plt 188, neutrophils 63,  Na 139, K 3.7, Bun 23, creat 0.69, eGFR 83 on Fe, Hgb 11.7 12/09/18    Atrial fibrillation ([place]) 05/21/2010   10/07/18 Na 143, K 4.0, Bun 20, creat 0.79, eGFR 72, wbc 7.1,  Hgb 10.9, plt 172 10/28/18 Na 142, K 4.0, Bun 19, creat 0.77, eGFR 74 11/18/18 Na 144, K 4.1, Bun 21, creat 0.69, eGFR 83   Bilateral lower extremity edema 07/19/2018   11/02/18 BMP 2 weeks.    Cervical pain (neck) 06/20/2010   Closed fracture of part of upper end of humerus 05/01/2015   Colles' fracture of right radius 03/05/2015   Constipation 07/19/2018   DEGENERATIVE JOINT DISEASE, RIGHT HIP 02/16/2007   Dupuytren's contracture    Dysuria 09/20/2018   09/20/18 c/o got up several times last night to go urinate, burning on urination, lower abd /back discomfort, but urinary frequency, leakage are not new. UA C/S, Pyridium '100mg'$  tid x 2 days.  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/2017   S/P breast biopsy, left    two o'clock position - benign   Toxic effect of venom(989.5) 07/27/2007   Tremor, unspecified 10/19/2015   Unstable gait 02/16/2017   Vaginal atrophy 10/19/2015   Venous insufficiency    Weakness 09/12/2018   Past Surgical History:  Procedure Laterality Date   BREAST BIOPSY Left    CATARACT EXTRACTION, BILATERAL      Allergies  Allergen Reactions   Doxycycline Other (See Comments)    Unknown   Sulfonamide Derivatives Other (See Comments)    Unknown     Allergies as of 04/01/2022       Reactions   Doxycycline Other (See Comments)   Unknown   Sulfonamide Derivatives Other (See Comments)   Unknown         Medication List        Accurate as of April 01, 2022  1:03 PM. If you have any questions, ask your nurse or doctor.          acetaminophen 500 MG tablet Commonly known as: TYLENOL Take 1,000 mg by mouth at bedtime. 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   calcium carbonate 750 MG chewable tablet Commonly known as: TUMS EX Chew 1 tablet by mouth daily.   carbidopa-levodopa 50-200 MG tablet Commonly known as: SINEMET CR Take 1 tablet by mouth at bedtime.   carbidopa-levodopa 25-100 MG disintegrating tablet Commonly known as: PARCOPA Take 2 tablets by mouth 5 (five) times daily.   carbidopa-levodopa 25-100 MG disintegrating tablet Commonly known as: PARCOPA Take 1 tablet by mouth See admin instructions. May give one additional tablet daily in addition to scheduled dose as needed for increased  symptoms related to Parkinson's. For taking at 11am if needed. Do not take after 1130 or within one hour of next dose.   Cranberry 450 MG Tabs Take 1 tablet by mouth daily.   diclofenac Sodium 1 % Gel Commonly known as: VOLTAREN Apply 2 g topically 3 (three) times daily as  needed (Neck pain).   entacapone 200 MG tablet Commonly known as: COMTAN Take 200 mg by mouth 3 (three) times daily.   ferrous sulfate 325 (65 FE) MG tablet Take 325 mg by mouth every Monday, Wednesday, and Friday.   hydrocortisone 2.5 % rectal cream Commonly known as: ANUSOL-HC Place 1 application. rectally daily as needed for hemorrhoids.   lactose free nutrition Liqd Take 237 mLs by mouth daily.   Macrodantin 100 MG capsule Generic drug: nitrofurantoin Take 100 mg by mouth daily. related to URINARY TRACT INFECTION   methocarbamol 500 MG tablet Commonly known as: ROBAXIN Take 250 mg by mouth daily as needed for muscle spasms.   midodrine 5 MG tablet Commonly known as: PROAMATINE Take 5 mg by mouth in the morning, at noon, and at bedtime.   mirabegron ER 50 MG Tb24 tablet Commonly known as: MYRBETRIQ Take 50 mg by mouth daily.   pantoprazole 40 MG tablet Commonly known as: PROTONIX Take 40 mg by mouth daily.   polyethylene glycol 17 g packet Commonly known as: MIRALAX / GLYCOLAX Take 17 g by mouth 2 (two) times daily.   polyvinyl alcohol 1.4 % ophthalmic solution Commonly known as: LIQUIFILM TEARS Place 1 drop into both eyes 3 (three) times daily as needed for dry eyes.   rivaroxaban 20 MG Tabs tablet Commonly known as: XARELTO Take 20 mg by mouth daily with supper.   rotigotine 2 MG/24HR Commonly known as: NEUPRO Place 1 patch onto the skin daily.   sennosides-docusate sodium 8.6-50 MG tablet Commonly known as: SENOKOT-S Take 2 tablets by mouth daily.   simethicone 80 MG chewable tablet Commonly known as: MYLICON Chew 80 mg by mouth with breakfast, with lunch, and with evening  meal.   sodium fluoride 1.1 % Crea dental cream Commonly known as: PREVIDENT 5000 PLUS Place 1 application. onto teeth in the morning and at bedtime.   Vitamin D3 10 MCG (400 UNIT) Caps Generic drug: Cholecalciferol Take 1 capsule by mouth daily.   zinc oxide 20 % ointment Apply 1 application. topically as needed for irritation. Apply to buttocks after every incontinent episode and for redness        Review of Systems  Constitutional:  Negative for appetite change, fatigue and fever.  HENT:  Positive for hearing loss. Negative for congestion, rhinorrhea and trouble swallowing.   Eyes:  Positive for visual disturbance.       C/o double vision  Respiratory:  Positive for shortness of breath. Negative for cough, chest tightness and wheezing.   Cardiovascular:  Positive for leg swelling.  Gastrointestinal:  Negative for abdominal pain and constipation.  Genitourinary:  Positive for frequency. Negative for dysuria, genital sores and urgency.       Bladder discomfort prior to urination after waited for over 4-5 hours.   Musculoskeletal:  Positive for arthralgias, back pain and gait problem. Negative for myalgias.       Lower back discomfort positional. Chronic shoulder pain L>R, full ROM  Skin:        Left great toe nail subungual hemorrhage, dorsum right hand hematoma.   Neurological:  Positive for tremors. Negative for speech difficulty, weakness and headaches.       Moves slow, fine tremor in fingers, burning sensation  in the R+ L great toes when touched by sheets, comes/goes  Psychiatric/Behavioral:  Negative for behavioral problems and sleep disturbance. The patient is nervous/anxious.        Feels anxious sometimes.     Immunization History  Administered Date(s) Administered   Influenza Split 10/20/2012   Influenza Whole 10/21/2006   Influenza, High Dose Seasonal PF 10/17/2016, 11/02/2019, 11/12/2021   Influenza-Unspecified 10/20/2013, 10/22/2017, 11/07/2020   Moderna  Sars-Covid-2 Vaccination 01/22/2019, 02/19/2019, 11/29/2019, 06/19/2020   Pfizer Covid-19 Vaccine Bivalent Booster 45yr & up 10/10/2020, 11/21/2021   Pneumococcal Conjugate-13 01/31/2013   Pneumococcal Polysaccharide-23 01/20/2005, 11/24/2011   Td 01/21/2004   Tdap 03/16/2014   Zoster Recombinat (Shingrix) 01/27/2022, 01/27/2022   Zoster, Live 04/17/2009   Pertinent  Health Maintenance Due  Topic Date Due   INFLUENZA VACCINE  Completed   DEXA SCAN  Completed   COLONOSCOPY (Pts 45-467yrInsurance coverage will need to be confirmed)  Discontinued      10/11/2021    8:48 AM 11/08/2021    3:36 PM 01/17/2022    9:02 AM 01/28/2022   11:54 AM 02/18/2022    9:59 AM  Fall Risk  Falls in the past year? 0 1 0 0 0  Was there an injury with Fall? 0 0 0 0 0  Fall Risk Category Calculator 0 1 0 0 0  Fall Risk Category (Retired) Low Low Low Low   (RETIRED) Patient Fall Risk Level Low fall risk Low fall risk Low fall risk Low fall risk   Patient at Risk for Falls Due to No Fall Risks No Fall Risks No Fall Risks No Fall Risks No Fall Risks  Fall risk Follow up Falls evaluation completed Falls evaluation completed Falls evaluation completed Falls evaluation completed Falls evaluation completed   Functional Status Survey:    Vitals:   04/01/22 1251  BP: 125/81  Pulse: 68  Resp: 18  Temp: (!) 96 F (35.6 C)  SpO2: 96%   There is no height or weight on file to calculate BMI. Physical Exam Vitals and nursing note reviewed.  Constitutional:      Appearance: Normal appearance.  HENT:     Head: Normocephalic and atraumatic.     Nose: Nose normal.     Mouth/Throat:     Mouth: Mucous membranes are moist.  Eyes:     Extraocular Movements: Extraocular movements intact.     Conjunctiva/sclera: Conjunctivae normal.     Pupils: Pupils are equal, round, and reactive to light.     Comments: Chronic double vision, see Ophthalmology 09/20/20  Cardiovascular:     Rate and Rhythm: Normal rate. Rhythm  irregular.     Heart sounds: No murmur heard.    Comments: DP pulses present L>R Pulmonary:     Effort: Pulmonary effort is normal.     Breath sounds: No rales.  Abdominal:     General: Bowel sounds are normal.     Palpations: Abdomen is soft.     Tenderness: There is no abdominal tenderness.  Musculoskeletal:     Cervical back: Normal range of motion and neck supple.     Right lower leg: Edema present.     Left lower leg: Edema present.     Comments: Trace -1+ edema BLE. Chronic lower back, shoulder R+L pain.   Skin:    General: Skin is warm and dry.     Comments: ABI showed moderate PAD  Right face small scabbed lesion, redness, mild warmth peri wound.   Neurological:     General: No focal deficit present.     Mental Status: She is alert and oriented to person, place, and time. Mental status is at baseline.     Coordination: Coordination abnormal.     Gait: Gait  abnormal.     Comments: Moves slow, tremor in hands. No facial or limb weakness.   Psychiatric:        Mood and Affect: Mood normal.        Behavior: Behavior normal.     Labs reviewed: Recent Labs    06/18/21 0000  NA 142  K 4.4  CL 108  CO2 27*  BUN 21  CREATININE 0.8  CALCIUM 9.3   Recent Labs    06/18/21 0000  AST 13  ALT 6*  ALKPHOS 48  ALBUMIN 4.0   Recent Labs    06/18/21 0000  WBC 4.4  NEUTROABS 2,803.00  HGB 11.7*  HCT 35*  PLT 159   Lab Results  Component Value Date   TSH 1.30 03/29/2020   No results found for: "HGBA1C" Lab Results  Component Value Date   CHOL 185 03/24/2019   HDL 79 (A) 03/24/2019   LDLCALC 94 03/24/2019   LDLDIRECT 104.2 01/25/2013   TRIG 41 03/24/2019   CHOLHDL 2 08/29/2015    Significant Diagnostic Results in last 30 days:  No results found.  Assessment/Plan  Skin lesion of face Right face small scabbed lesion, redness, mild warmth peri wound. Apply 0.5% Triamcinolone cream, Bactroban oint daily x 10 days, referral to Dermatology  PAD  (peripheral artery disease) ([place]) PAD 12/25/20 ABI R+L 0.7, moderate arterial disease, vascular specialist if desires.   GERD (gastroesophageal reflux disease) Stable, takes Protonix, Simethicone.   Atrial fibrillation ([facility]) heart rate is in control, on Xarelto, off Diltiazem. Hgb 11.8 11/22/20, TSH 1.30 03/29/20  Edema of left lower extremity due to peripheral venous insufficiency The patient has chronic edema BLE. Off Metolazone 09/27/19. Echo EF normal in the past.  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 double vision, chronic, saw Ophthalmology   Hypotension Blood pressure is maintained, takes Midodrine.   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   Slow transit constipation Stable, takes Pharmacist, hospital Communication: plan of care reviewed with the patient and charge nurse.   Labs/tests ordered: none  Time spend 35 minutes.

## 2022-04-01 NOTE — Assessment & Plan Note (Signed)
stable, Hgb 11.8 11/22/20,  takes Fe 

## 2022-04-01 NOTE — Assessment & Plan Note (Signed)
Right face small scabbed lesion, redness, mild warmth peri wound. Apply 0.5% Triamcinolone cream, Bactroban oint daily x 10 days, referral to Dermatology

## 2022-04-01 NOTE — Assessment & Plan Note (Signed)
Stable, takes Protonix, Simethicone.  

## 2022-04-01 NOTE — Assessment & Plan Note (Signed)
The patient has chronic edema BLE. Off Metolazone 09/27/19. Echo EF normal in the past.  

## 2022-04-01 NOTE — Assessment & Plan Note (Signed)
Urge incontinent of urine, takes Mirabegron, needs urinate q3-4 hours to avoid bladder discomfort.  

## 2022-04-01 NOTE — Assessment & Plan Note (Signed)
heart rate is in control, on Xarelto, off Diltiazem. Hgb 11.8 11/22/20, TSH 1.30 03/29/20 °

## 2022-04-01 NOTE — Assessment & Plan Note (Signed)
on Tylenol, Methocarbamol, shoulder pain, f/u Ortho 

## 2022-04-03 DIAGNOSIS — R1312 Dysphagia, oropharyngeal phase: Secondary | ICD-10-CM | POA: Diagnosis not present

## 2022-04-03 DIAGNOSIS — R262 Difficulty in walking, not elsewhere classified: Secondary | ICD-10-CM | POA: Diagnosis not present

## 2022-04-03 DIAGNOSIS — M6281 Muscle weakness (generalized): Secondary | ICD-10-CM | POA: Diagnosis not present

## 2022-04-03 DIAGNOSIS — R471 Dysarthria and anarthria: Secondary | ICD-10-CM | POA: Diagnosis not present

## 2022-04-03 DIAGNOSIS — R2689 Other abnormalities of gait and mobility: Secondary | ICD-10-CM | POA: Diagnosis not present

## 2022-04-07 DIAGNOSIS — R2689 Other abnormalities of gait and mobility: Secondary | ICD-10-CM | POA: Diagnosis not present

## 2022-04-07 DIAGNOSIS — M6281 Muscle weakness (generalized): Secondary | ICD-10-CM | POA: Diagnosis not present

## 2022-04-07 DIAGNOSIS — R471 Dysarthria and anarthria: Secondary | ICD-10-CM | POA: Diagnosis not present

## 2022-04-07 DIAGNOSIS — R262 Difficulty in walking, not elsewhere classified: Secondary | ICD-10-CM | POA: Diagnosis not present

## 2022-04-07 DIAGNOSIS — R1312 Dysphagia, oropharyngeal phase: Secondary | ICD-10-CM | POA: Diagnosis not present

## 2022-04-08 ENCOUNTER — Encounter: Payer: Self-pay | Admitting: Nurse Practitioner

## 2022-04-08 ENCOUNTER — Non-Acute Institutional Stay (SKILLED_NURSING_FACILITY): Payer: Medicare PPO | Admitting: Nurse Practitioner

## 2022-04-08 DIAGNOSIS — G903 Multi-system degeneration of the autonomic nervous system: Secondary | ICD-10-CM | POA: Diagnosis not present

## 2022-04-08 DIAGNOSIS — G20C Parkinsonism, unspecified: Secondary | ICD-10-CM | POA: Diagnosis not present

## 2022-04-08 DIAGNOSIS — R1312 Dysphagia, oropharyngeal phase: Secondary | ICD-10-CM | POA: Diagnosis not present

## 2022-04-08 DIAGNOSIS — I872 Venous insufficiency (chronic) (peripheral): Secondary | ICD-10-CM | POA: Diagnosis not present

## 2022-04-08 DIAGNOSIS — M6281 Muscle weakness (generalized): Secondary | ICD-10-CM | POA: Diagnosis not present

## 2022-04-08 DIAGNOSIS — K5901 Slow transit constipation: Secondary | ICD-10-CM

## 2022-04-08 DIAGNOSIS — D500 Iron deficiency anemia secondary to blood loss (chronic): Secondary | ICD-10-CM

## 2022-04-08 DIAGNOSIS — I480 Paroxysmal atrial fibrillation: Secondary | ICD-10-CM | POA: Diagnosis not present

## 2022-04-08 DIAGNOSIS — K219 Gastro-esophageal reflux disease without esophagitis: Secondary | ICD-10-CM | POA: Diagnosis not present

## 2022-04-08 DIAGNOSIS — R262 Difficulty in walking, not elsewhere classified: Secondary | ICD-10-CM | POA: Diagnosis not present

## 2022-04-08 DIAGNOSIS — M159 Polyosteoarthritis, unspecified: Secondary | ICD-10-CM

## 2022-04-08 DIAGNOSIS — N3941 Urge incontinence: Secondary | ICD-10-CM

## 2022-04-08 DIAGNOSIS — I739 Peripheral vascular disease, unspecified: Secondary | ICD-10-CM

## 2022-04-08 DIAGNOSIS — Z87440 Personal history of urinary (tract) infections: Secondary | ICD-10-CM

## 2022-04-08 DIAGNOSIS — R2689 Other abnormalities of gait and mobility: Secondary | ICD-10-CM | POA: Diagnosis not present

## 2022-04-08 DIAGNOSIS — R471 Dysarthria and anarthria: Secondary | ICD-10-CM | POA: Diagnosis not present

## 2022-04-08 DIAGNOSIS — S90222A Contusion of left lesser toe(s) with damage to nail, initial encounter: Secondary | ICD-10-CM

## 2022-04-08 NOTE — Progress Notes (Signed)
Location:   SNF [place] Room Number: 13 Place of Service:  SNF (31) Provider: [facility] Jaxtyn Linville NP  [name], MD  Patient Care Team: [name], MD as PCP - General (Internal Medicine) [name], [name], MD as PCP - Cardiology (Cardiology) [name], [name], DO as Consulting Physician (Neurology) Sabri Teal X, NP as Nurse Practitioner (Internal Medicine)  Extended Emergency Contact Information Primary Emergency Contact: Achsah, Gilyard Mobile Phone: [phone] Relation: Daughter Interpreter needed? No Secondary Emergency Contact: [facility] Address: [address]          [place],  [address] [name] of [place] Phone: [phone] Mobile Phone: [phone] Relation: Son  Code Status: DNR Goals of care: Advanced Directive information    04/08/2022    3:38 PM  Advanced Directives  Does Patient Have a Medical Advance Directive? Yes  Type of Paramedic of [place];Out of facility DNR (pink MOST or yellow form)  Does patient want to make changes to medical advance directive? No - Patient declined  Copy of [place] in Chart? Yes - validated most recent copy scanned in chart (See row information)  Pre-existing out of facility DNR order (yellow form or pink MOST form) Yellow form placed in chart (order not valid for inpatient use)     Chief Complaint  Patient presents with   Acute Visit    Swelling in the legs    HPI:  Pt is a 82 y.o. female seen today for an acute visit for c/o more swelling in feet, no open wound, redness, or warmth.   Chronic edema BLE, did not tolerated diuretics in the past. Off Metolazone 09/27/19. Echo EF normal in the past.   Right face small scabbed lesion, redness, mild warmth peri wound-much better, s/p 10 days of Triamcinolone, Bactroban.               Anxiety, chronic, situational             PAD 12/25/20 ABI R+L 0.7, moderate arterial disease, vascular specialist if desires.               GERD, takes Protonix, Simethicone.                           Afib, heart rate is in control, on Xarelto, off Diltiazem. Hgb 11.8 11/22/20, TSH 1.30 03/29/20             OA,  on Tylenol, Methocarbamol, shoulder pain, f/u Ortho Parkinson's, symptoms improved, on Sinemet, Comtan, started Neupro 05/08/21,  f/u neurology at [facility]. double vision, chronic, saw Ophthalmology              Orthostatic hypotension, takes Midodrine.              Anemia, stable, Hgb 11.8 11/22/20,  takes Fe             Urge incontinent of urine, takes Mirabegron, needs urinate q3-4 hours to avoid bladder discomfort.              Recurrent UTI, saw Urology, placed on Estrace x2/wk, Nitrofurantoin 100mg  daily             Constipation, takes MiraLax    Past Medical History:  Diagnosis Date   Acute lower UTI 09/14/2018   Anemia 10/15/2018   2016 colonoscopy 10/14/18 wbc 5.4, Hgb 10.1, plt 184 12/01/18 wbc 4.5, Hgb 11.1, plt 188, neutrophils 63,  Na  139, K 3.7, Bun 23, creat 0.69, eGFR 83 on Fe, Hgb 11.7 12/09/18    Atrial fibrillation ([place]) 05/21/2010   10/07/18 Na 143, K 4.0, Bun 20, creat 0.79, eGFR 72, wbc 7.1, Hgb 10.9, plt 172 10/28/18 Na 142, K 4.0, Bun 19, creat 0.77, eGFR 74 11/18/18 Na 144, K 4.1, Bun 21, creat 0.69, eGFR 83   Bilateral lower extremity edema 07/19/2018   11/02/18 BMP 2 weeks.    Cervical pain (neck) 06/20/2010   Closed fracture of part of upper end of humerus 05/01/2015   Colles' fracture of right radius 03/05/2015   Constipation 07/19/2018   DEGENERATIVE JOINT DISEASE, RIGHT HIP 02/16/2007   Dupuytren's contracture    Dysuria 09/20/2018   09/20/18 c/o got up several times last night to go urinate, burning on urination, lower abd /back discomfort, but urinary frequency, leakage are not new. UA C/S, Pyridium 100mg  tid x 2 days.  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/2017   S/P breast biopsy, left    two o'clock position - benign   Toxic effect of venom(989.5) 07/27/2007   Tremor, unspecified 10/19/2015   Unstable gait 02/16/2017   Vaginal atrophy 10/19/2015   Venous insufficiency    Weakness 09/12/2018   Past Surgical History:  Procedure Laterality Date   BREAST BIOPSY Left    CATARACT EXTRACTION, BILATERAL      Allergies  Allergen Reactions   Doxycycline Other (See Comments)    Unknown   Sulfonamide Derivatives Other (See Comments)    Unknown     Allergies as of 04/08/2022       Reactions   Doxycycline Other (See Comments)   Unknown   Sulfonamide Derivatives Other (See Comments)   Unknown         Medication List        Accurate as of April 08, 2022  4:49 PM. If you have any questions, ask your nurse or doctor.          acetaminophen 500 MG tablet Commonly known as: TYLENOL Take 1,000 mg by mouth at bedtime. 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   calcium carbonate 750 MG chewable tablet Commonly known as: TUMS EX Chew 1 tablet by mouth daily.   carbidopa-levodopa 50-200 MG tablet Commonly known as: SINEMET CR Take 1 tablet by mouth at bedtime.   carbidopa-levodopa 25-100 MG disintegrating tablet Commonly known as: PARCOPA Take 2 tablets by mouth 5 (five) times daily.   carbidopa-levodopa 25-100 MG disintegrating tablet Commonly known as: PARCOPA Take 1 tablet  by mouth See admin instructions. May give one additional tablet daily in addition to scheduled dose as needed for increased symptoms related to Parkinson's. For taking at 11am if needed. Do not take after 1130 or within one  hour of next dose.   Cranberry 450 MG Tabs Take 1 tablet by mouth daily.   diclofenac Sodium 1 % Gel Commonly known as: VOLTAREN Apply 2 g topically 3 (three) times daily as needed (Neck pain).   entacapone 200 MG tablet Commonly known as: COMTAN Take 200 mg by mouth 3 (three) times daily.   ferrous sulfate 325 (65 FE) MG tablet Take 325 mg by mouth every Monday, Wednesday, and Friday.   hydrocortisone 2.5 % rectal cream Commonly known as: ANUSOL-HC Place 1 application. rectally daily as needed for hemorrhoids.   lactose free nutrition Liqd Take 237 mLs by mouth daily.   Macrodantin 100 MG capsule Generic drug: nitrofurantoin Take 100 mg by mouth daily. related to URINARY TRACT INFECTION   methocarbamol 500 MG tablet Commonly known as: ROBAXIN Take 250 mg by mouth daily as needed for muscle spasms.   midodrine 5 MG tablet Commonly known as: PROAMATINE Take 5 mg by mouth in the morning, at noon, and at bedtime.   mirabegron ER 50 MG Tb24 tablet Commonly known as: MYRBETRIQ Take 50 mg by mouth daily.   pantoprazole 40 MG tablet Commonly known as: PROTONIX Take 40 mg by mouth daily.   polyethylene glycol 17 g packet Commonly known as: MIRALAX / GLYCOLAX Take 17 g by mouth 2 (two) times daily.   polyvinyl alcohol 1.4 % ophthalmic solution Commonly known as: LIQUIFILM TEARS Place 1 drop into both eyes 3 (three) times daily as needed for dry eyes.   rivaroxaban 20 MG Tabs tablet Commonly known as: XARELTO Take 20 mg by mouth daily with supper.   rotigotine 2 MG/24HR Commonly known as: NEUPRO Place 1 patch onto the skin daily.   sennosides-docusate sodium 8.6-50 MG tablet Commonly known as: SENOKOT-S Take 2 tablets by mouth daily.    simethicone 80 MG chewable tablet Commonly known as: MYLICON Chew 80 mg by mouth with breakfast, with lunch, and with evening meal.   sodium fluoride 1.1 % Crea dental cream Commonly known as: PREVIDENT 5000 PLUS Place 1 application. onto teeth in the morning and at bedtime.   Vitamin D3 10 MCG (400 UNIT) Caps Generic drug: Cholecalciferol Take 1 capsule by mouth daily.   zinc oxide 20 % ointment Apply 1 application. topically as needed for irritation. Apply to buttocks after every incontinent episode and for redness        Review of Systems  Constitutional:  Negative for appetite change, fatigue and fever.  HENT:  Positive for hearing loss. Negative for congestion, rhinorrhea and trouble swallowing.   Eyes:  Positive for visual disturbance.       C/o double vision  Respiratory:  Positive for shortness of breath. Negative for cough, chest tightness and wheezing.        DOE chronic  Cardiovascular:  Positive for leg swelling.  Gastrointestinal:  Negative for abdominal pain and constipation.  Genitourinary:  Positive for frequency. Negative for dysuria, genital sores and urgency.       Bladder discomfort prior to urination after waited for over 4-5 hours.   Musculoskeletal:  Positive for arthralgias, back pain and gait problem. Negative for myalgias.       Lower back discomfort positional. Chronic shoulder pain L>R, full ROM  Skin:  Negative for color change.  Neurological:  Positive for tremors. Negative for speech difficulty, weakness and headaches.       Moves slow, fine tremor in fingers, burning sensation  in the R+ L great toes when touched by sheets,  comes/goes  Psychiatric/Behavioral:  Negative for behavioral problems and sleep disturbance. The patient is nervous/anxious.        Feels anxious sometimes.     Immunization History  Administered Date(s) Administered   Influenza Split 10/20/2012   Influenza Whole 10/21/2006   Influenza, High Dose Seasonal PF 10/17/2016,  11/02/2019, 11/12/2021   Influenza-Unspecified 10/20/2013, 10/22/2017, 11/07/2020   Moderna Sars-Covid-2 Vaccination 01/22/2019, 02/19/2019, 11/29/2019, 06/19/2020   Pfizer Covid-19 Vaccine Bivalent Booster 65yrs & up 10/10/2020, 11/21/2021   Pneumococcal Conjugate-13 01/31/2013   Pneumococcal Polysaccharide-23 01/20/2005, 11/24/2011   Td 01/21/2004   Tdap 03/16/2014   Zoster Recombinat (Shingrix) 01/27/2022, 01/27/2022   Zoster, Live 04/17/2009   Pertinent  Health Maintenance Due  Topic Date Due   INFLUENZA VACCINE  Completed   DEXA SCAN  Completed   COLONOSCOPY (Pts 45-35yrs Insurance coverage will need to be confirmed)  Discontinued      11/08/2021    3:36 PM 01/17/2022    9:02 AM 01/28/2022   11:54 AM 02/18/2022    9:59 AM 04/08/2022    3:38 PM  Fall Risk  Falls in the past year? 1 0 0 0 0  Was there an injury with Fall? 0 0 0 0 0  Fall Risk Category Calculator 1 0 0 0 0  Fall Risk Category (Retired) Low Low Low    (RETIRED) Patient Fall Risk Level Low fall risk Low fall risk Low fall risk    Patient at Risk for Falls Due to No Fall Risks No Fall Risks No Fall Risks No Fall Risks No Fall Risks  Fall risk Follow up Falls evaluation completed Falls evaluation completed Falls evaluation completed Falls evaluation completed Falls evaluation completed   Functional Status Survey:    Vitals:   04/08/22 1534  BP: 124/63  Pulse: 73  Resp: 18  Temp: 97.6 F (36.4 C)  SpO2: 94%  Weight: 133 lb 3 oz (60.4 kg)  Height: 5\' 8"  (1.727 m)   Body mass index is 20.25 kg/m. Physical Exam Vitals and nursing note reviewed.  Constitutional:      Appearance: Normal appearance.  HENT:     Head: Normocephalic and atraumatic.     Nose: Nose normal.     Mouth/Throat:     Mouth: Mucous membranes are moist.  Eyes:     Extraocular Movements: Extraocular movements intact.     Conjunctiva/sclera: Conjunctivae normal.     Pupils: Pupils are equal, round, and reactive to light.      Comments: Chronic double vision, see Ophthalmology 09/20/20  Cardiovascular:     Rate and Rhythm: Normal rate. Rhythm irregular.     Heart sounds: No murmur heard.    Comments: DP pulses present L>R Pulmonary:     Effort: Pulmonary effort is normal.     Breath sounds: No rales.  Abdominal:     General: Bowel sounds are normal.     Palpations: Abdomen is soft.     Tenderness: There is no abdominal tenderness.  Musculoskeletal:     Cervical back: Normal range of motion and neck supple.     Right lower leg: Edema present.     Left lower leg: Edema present.     Comments: Trace -1+ edema BLE. Chronic lower back, shoulder R+L pain.   Skin:    General: Skin is warm and dry.     Comments: ABI showed moderate PAD  Right face small scabbed lesion, redness, mild warmth peri wound.   Neurological:     General:  No focal deficit present.     Mental Status: She is alert and oriented to person, place, and time. Mental status is at baseline.     Coordination: Coordination abnormal.     Gait: Gait abnormal.     Comments: Moves slow, tremor in hands. No facial or limb weakness.   Psychiatric:        Mood and Affect: Mood normal.        Behavior: Behavior normal.     Labs reviewed: Recent Labs    06/18/21 0000  NA 142  K 4.4  CL 108  CO2 27*  BUN 21  CREATININE 0.8  CALCIUM 9.3   Recent Labs    06/18/21 0000  AST 13  ALT 6*  ALKPHOS 48  ALBUMIN 4.0   Recent Labs    06/18/21 0000  WBC 4.4  NEUTROABS 2,803.00  HGB 11.7*  HCT 35*  PLT 159   Lab Results  Component Value Date   TSH 1.30 03/29/2020   No results found for: "HGBA1C" Lab Results  Component Value Date   CHOL 185 03/24/2019   HDL 79 (A) 03/24/2019   LDLCALC 94 03/24/2019   LDLDIRECT 104.2 01/25/2013   TRIG 41 03/24/2019   CHOLHDL 2 08/29/2015    Significant Diagnostic Results in last 30 days:  No results found.  Assessment/Plan: Edema of left lower extremity due to peripheral venous insufficiency c/o  more swelling in feet, no open wound, redness, or warmth. About #3 Ibs weigh gained, no cough, phlegm, SOB, chest pain/pressure, or palpitation. May Furosemide 20mg /Kcl 34meq if weigh gains >3-5 Ibs. Wt 2x/wk.    Chronic edema BLE, did not tolerated diuretics in the past. Echo EF normal in the past. Off Metolazone 09/27/19.  Subungual hemorrhage of toenail, left, initial encounter Right face small scabbed lesion, redness, mild warmth peri wound-much better, s/p 10 days of Triamcinolone, Bactroban.   PAD (peripheral artery disease) (HCC) PAD 12/25/20 ABI R+L 0.7, moderate arterial disease, vascular specialist if desires.   GERD (gastroesophageal reflux disease) Stable, takes Protonix, Simethicone.   Atrial fibrillation (HCC) heart rate is in control, on Xarelto, off Diltiazem. 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 double vision, chronic, saw Ophthalmology   Hypotension  takes Midodrine.   Anemia stable, Hgb 11.8 11/22/20,  takes Fe  Urge incontinence of urine  takes Mirabegron, needs urinate q3-4 hours to avoid bladder discomfort.   History of recurrent UTIs saw Urology, placed on Estrace x2/wk, Nitrofurantoin 100mg  daily  Slow transit constipation Stable,  takes Freight forwarder Communication: plan of care reviewed with the patient and charge nurse.   Labs/tests ordered:  none  Time spend 35 minutes.

## 2022-04-08 NOTE — Assessment & Plan Note (Signed)
takes Midodrine.  

## 2022-04-08 NOTE — Assessment & Plan Note (Signed)
symptoms improved, on Sinemet, Comtan, started Neupro 05/08/21,  f/u neurology at [facility]. double vision, chronic, saw Ophthalmology  

## 2022-04-08 NOTE — Assessment & Plan Note (Addendum)
c/o more swelling in feet, no open wound, redness, or warmth. About #3 Ibs weigh gained, no cough, phlegm, SOB, chest pain/pressure, or palpitation. May Furosemide 20mg /Kcl 44meq if weigh gains >3-5 Ibs. Wt 2x/wk.    Chronic edema BLE, did not tolerated diuretics in the past. Echo EF normal in the past. Off Metolazone 09/27/19.

## 2022-04-08 NOTE — Assessment & Plan Note (Signed)
heart rate is in control, on Xarelto, off Diltiazem. Hgb 11.8 11/22/20, TSH 1.30 03/29/20 °

## 2022-04-08 NOTE — Assessment & Plan Note (Signed)
PAD 12/25/20 ABI R+L 0.7, moderate arterial disease, vascular specialist if desires.  

## 2022-04-08 NOTE — Assessment & Plan Note (Signed)
Stable, takes MiraLax.  

## 2022-04-08 NOTE — Assessment & Plan Note (Signed)
on Tylenol, Methocarbamol, shoulder pain, f/u Ortho 

## 2022-04-08 NOTE — Assessment & Plan Note (Signed)
takes Mirabegron, needs urinate q3-4 hours to avoid bladder discomfort.  

## 2022-04-08 NOTE — Assessment & Plan Note (Signed)
Right face small scabbed lesion, redness, mild warmth peri wound-much better, s/p 10 days of Triamcinolone, Bactroban.

## 2022-04-08 NOTE — Assessment & Plan Note (Signed)
stable, Hgb 11.8 11/22/20,  takes Fe 

## 2022-04-08 NOTE — Assessment & Plan Note (Signed)
Stable, takes Protonix, Simethicone.  

## 2022-04-08 NOTE — Assessment & Plan Note (Signed)
saw Urology, placed on Estrace x2/wk, Nitrofurantoin 100mg  daily

## 2022-04-09 DIAGNOSIS — M6281 Muscle weakness (generalized): Secondary | ICD-10-CM | POA: Diagnosis not present

## 2022-04-09 DIAGNOSIS — R2689 Other abnormalities of gait and mobility: Secondary | ICD-10-CM | POA: Diagnosis not present

## 2022-04-09 DIAGNOSIS — R262 Difficulty in walking, not elsewhere classified: Secondary | ICD-10-CM | POA: Diagnosis not present

## 2022-04-09 DIAGNOSIS — R1312 Dysphagia, oropharyngeal phase: Secondary | ICD-10-CM | POA: Diagnosis not present

## 2022-04-09 DIAGNOSIS — R471 Dysarthria and anarthria: Secondary | ICD-10-CM | POA: Diagnosis not present

## 2022-04-11 DIAGNOSIS — R262 Difficulty in walking, not elsewhere classified: Secondary | ICD-10-CM | POA: Diagnosis not present

## 2022-04-11 DIAGNOSIS — R471 Dysarthria and anarthria: Secondary | ICD-10-CM | POA: Diagnosis not present

## 2022-04-11 DIAGNOSIS — R1312 Dysphagia, oropharyngeal phase: Secondary | ICD-10-CM | POA: Diagnosis not present

## 2022-04-11 DIAGNOSIS — R2689 Other abnormalities of gait and mobility: Secondary | ICD-10-CM | POA: Diagnosis not present

## 2022-04-11 DIAGNOSIS — M6281 Muscle weakness (generalized): Secondary | ICD-10-CM | POA: Diagnosis not present

## 2022-04-14 DIAGNOSIS — R2689 Other abnormalities of gait and mobility: Secondary | ICD-10-CM | POA: Diagnosis not present

## 2022-04-14 DIAGNOSIS — R471 Dysarthria and anarthria: Secondary | ICD-10-CM | POA: Diagnosis not present

## 2022-04-14 DIAGNOSIS — M6281 Muscle weakness (generalized): Secondary | ICD-10-CM | POA: Diagnosis not present

## 2022-04-14 DIAGNOSIS — R262 Difficulty in walking, not elsewhere classified: Secondary | ICD-10-CM | POA: Diagnosis not present

## 2022-04-14 DIAGNOSIS — R1312 Dysphagia, oropharyngeal phase: Secondary | ICD-10-CM | POA: Diagnosis not present

## 2022-04-15 DIAGNOSIS — R2689 Other abnormalities of gait and mobility: Secondary | ICD-10-CM | POA: Diagnosis not present

## 2022-04-15 DIAGNOSIS — R1312 Dysphagia, oropharyngeal phase: Secondary | ICD-10-CM | POA: Diagnosis not present

## 2022-04-15 DIAGNOSIS — R262 Difficulty in walking, not elsewhere classified: Secondary | ICD-10-CM | POA: Diagnosis not present

## 2022-04-15 DIAGNOSIS — M6281 Muscle weakness (generalized): Secondary | ICD-10-CM | POA: Diagnosis not present

## 2022-04-15 DIAGNOSIS — R471 Dysarthria and anarthria: Secondary | ICD-10-CM | POA: Diagnosis not present

## 2022-04-16 DIAGNOSIS — M6281 Muscle weakness (generalized): Secondary | ICD-10-CM | POA: Diagnosis not present

## 2022-04-16 DIAGNOSIS — R1312 Dysphagia, oropharyngeal phase: Secondary | ICD-10-CM | POA: Diagnosis not present

## 2022-04-16 DIAGNOSIS — R471 Dysarthria and anarthria: Secondary | ICD-10-CM | POA: Diagnosis not present

## 2022-04-16 DIAGNOSIS — R2689 Other abnormalities of gait and mobility: Secondary | ICD-10-CM | POA: Diagnosis not present

## 2022-04-16 DIAGNOSIS — R262 Difficulty in walking, not elsewhere classified: Secondary | ICD-10-CM | POA: Diagnosis not present

## 2022-04-17 DIAGNOSIS — R262 Difficulty in walking, not elsewhere classified: Secondary | ICD-10-CM | POA: Diagnosis not present

## 2022-04-17 DIAGNOSIS — R471 Dysarthria and anarthria: Secondary | ICD-10-CM | POA: Diagnosis not present

## 2022-04-17 DIAGNOSIS — M6281 Muscle weakness (generalized): Secondary | ICD-10-CM | POA: Diagnosis not present

## 2022-04-17 DIAGNOSIS — R2689 Other abnormalities of gait and mobility: Secondary | ICD-10-CM | POA: Diagnosis not present

## 2022-04-17 DIAGNOSIS — R1312 Dysphagia, oropharyngeal phase: Secondary | ICD-10-CM | POA: Diagnosis not present

## 2022-04-18 DIAGNOSIS — R1312 Dysphagia, oropharyngeal phase: Secondary | ICD-10-CM | POA: Diagnosis not present

## 2022-04-18 DIAGNOSIS — R262 Difficulty in walking, not elsewhere classified: Secondary | ICD-10-CM | POA: Diagnosis not present

## 2022-04-18 DIAGNOSIS — R2689 Other abnormalities of gait and mobility: Secondary | ICD-10-CM | POA: Diagnosis not present

## 2022-04-18 DIAGNOSIS — R471 Dysarthria and anarthria: Secondary | ICD-10-CM | POA: Diagnosis not present

## 2022-04-18 DIAGNOSIS — M6281 Muscle weakness (generalized): Secondary | ICD-10-CM | POA: Diagnosis not present

## 2022-04-22 DIAGNOSIS — R471 Dysarthria and anarthria: Secondary | ICD-10-CM | POA: Diagnosis not present

## 2022-04-22 DIAGNOSIS — R1312 Dysphagia, oropharyngeal phase: Secondary | ICD-10-CM | POA: Diagnosis not present

## 2022-04-22 DIAGNOSIS — M6281 Muscle weakness (generalized): Secondary | ICD-10-CM | POA: Diagnosis not present

## 2022-04-22 DIAGNOSIS — R2689 Other abnormalities of gait and mobility: Secondary | ICD-10-CM | POA: Diagnosis not present

## 2022-04-22 DIAGNOSIS — R262 Difficulty in walking, not elsewhere classified: Secondary | ICD-10-CM | POA: Diagnosis not present

## 2022-04-24 DIAGNOSIS — R471 Dysarthria and anarthria: Secondary | ICD-10-CM | POA: Diagnosis not present

## 2022-04-24 DIAGNOSIS — R262 Difficulty in walking, not elsewhere classified: Secondary | ICD-10-CM | POA: Diagnosis not present

## 2022-04-24 DIAGNOSIS — M6281 Muscle weakness (generalized): Secondary | ICD-10-CM | POA: Diagnosis not present

## 2022-04-24 DIAGNOSIS — R1312 Dysphagia, oropharyngeal phase: Secondary | ICD-10-CM | POA: Diagnosis not present

## 2022-04-24 DIAGNOSIS — R2689 Other abnormalities of gait and mobility: Secondary | ICD-10-CM | POA: Diagnosis not present

## 2022-04-29 DIAGNOSIS — R2689 Other abnormalities of gait and mobility: Secondary | ICD-10-CM | POA: Diagnosis not present

## 2022-04-29 DIAGNOSIS — R262 Difficulty in walking, not elsewhere classified: Secondary | ICD-10-CM | POA: Diagnosis not present

## 2022-04-29 DIAGNOSIS — L309 Dermatitis, unspecified: Secondary | ICD-10-CM | POA: Diagnosis not present

## 2022-04-29 DIAGNOSIS — R471 Dysarthria and anarthria: Secondary | ICD-10-CM | POA: Diagnosis not present

## 2022-04-29 DIAGNOSIS — M6281 Muscle weakness (generalized): Secondary | ICD-10-CM | POA: Diagnosis not present

## 2022-04-29 DIAGNOSIS — R1312 Dysphagia, oropharyngeal phase: Secondary | ICD-10-CM | POA: Diagnosis not present

## 2022-05-01 DIAGNOSIS — R471 Dysarthria and anarthria: Secondary | ICD-10-CM | POA: Diagnosis not present

## 2022-05-01 DIAGNOSIS — R262 Difficulty in walking, not elsewhere classified: Secondary | ICD-10-CM | POA: Diagnosis not present

## 2022-05-01 DIAGNOSIS — M6281 Muscle weakness (generalized): Secondary | ICD-10-CM | POA: Diagnosis not present

## 2022-05-01 DIAGNOSIS — R2689 Other abnormalities of gait and mobility: Secondary | ICD-10-CM | POA: Diagnosis not present

## 2022-05-01 DIAGNOSIS — R1312 Dysphagia, oropharyngeal phase: Secondary | ICD-10-CM | POA: Diagnosis not present

## 2022-05-02 DIAGNOSIS — M6281 Muscle weakness (generalized): Secondary | ICD-10-CM | POA: Diagnosis not present

## 2022-05-02 DIAGNOSIS — R2689 Other abnormalities of gait and mobility: Secondary | ICD-10-CM | POA: Diagnosis not present

## 2022-05-02 DIAGNOSIS — R262 Difficulty in walking, not elsewhere classified: Secondary | ICD-10-CM | POA: Diagnosis not present

## 2022-05-02 DIAGNOSIS — R1312 Dysphagia, oropharyngeal phase: Secondary | ICD-10-CM | POA: Diagnosis not present

## 2022-05-02 DIAGNOSIS — R471 Dysarthria and anarthria: Secondary | ICD-10-CM | POA: Diagnosis not present

## 2022-05-05 ENCOUNTER — Non-Acute Institutional Stay (SKILLED_NURSING_FACILITY): Payer: Medicare PPO | Admitting: Nurse Practitioner

## 2022-05-05 ENCOUNTER — Encounter: Payer: Self-pay | Admitting: Nurse Practitioner

## 2022-05-05 DIAGNOSIS — I480 Paroxysmal atrial fibrillation: Secondary | ICD-10-CM

## 2022-05-05 DIAGNOSIS — I872 Venous insufficiency (chronic) (peripheral): Secondary | ICD-10-CM | POA: Diagnosis not present

## 2022-05-05 DIAGNOSIS — F419 Anxiety disorder, unspecified: Secondary | ICD-10-CM | POA: Diagnosis not present

## 2022-05-05 DIAGNOSIS — K219 Gastro-esophageal reflux disease without esophagitis: Secondary | ICD-10-CM | POA: Diagnosis not present

## 2022-05-05 DIAGNOSIS — G903 Multi-system degeneration of the autonomic nervous system: Secondary | ICD-10-CM

## 2022-05-05 DIAGNOSIS — R262 Difficulty in walking, not elsewhere classified: Secondary | ICD-10-CM | POA: Diagnosis not present

## 2022-05-05 DIAGNOSIS — M6281 Muscle weakness (generalized): Secondary | ICD-10-CM | POA: Diagnosis not present

## 2022-05-05 DIAGNOSIS — Z87440 Personal history of urinary (tract) infections: Secondary | ICD-10-CM

## 2022-05-05 DIAGNOSIS — G20C Parkinsonism, unspecified: Secondary | ICD-10-CM | POA: Diagnosis not present

## 2022-05-05 DIAGNOSIS — K5901 Slow transit constipation: Secondary | ICD-10-CM

## 2022-05-05 DIAGNOSIS — R2689 Other abnormalities of gait and mobility: Secondary | ICD-10-CM | POA: Diagnosis not present

## 2022-05-05 DIAGNOSIS — D500 Iron deficiency anemia secondary to blood loss (chronic): Secondary | ICD-10-CM

## 2022-05-05 DIAGNOSIS — R1312 Dysphagia, oropharyngeal phase: Secondary | ICD-10-CM | POA: Diagnosis not present

## 2022-05-05 DIAGNOSIS — M159 Polyosteoarthritis, unspecified: Secondary | ICD-10-CM | POA: Diagnosis not present

## 2022-05-05 DIAGNOSIS — R471 Dysarthria and anarthria: Secondary | ICD-10-CM | POA: Diagnosis not present

## 2022-05-05 NOTE — Progress Notes (Signed)
Location:   SNF [facility] Room Number: 13 Place of Service:  SNF (31) Provider: [facility] Dominga Mcduffie NP  Mahlon Gammon, MD  Patient Care Team: Mahlon Gammon, MD as PCP - General (Internal Medicine) [name], Deloris Ping, MD as PCP - Cardiology (Cardiology) [name], Octaviano Batty, DO as Consulting Physician (Neurology) Cambreigh Dearing X, NP as Nurse Practitioner (Internal Medicine)  Extended Emergency Contact Information Primary Emergency Contact: Meshell, Abdulaziz Mobile Phone: [phone] Relation: Daughter Interpreter needed? No Secondary Emergency Contact: [facility] Address: [address]          [place], [place] [address] [place] of [place] Home Phone: [phone] Mobile Phone: [phone] Relation: Son  Code Status:  DNR Goals of care: Advanced Directive information    04/08/2022    3:38 PM  Advanced Directives  Does Patient Have a Medical Advance Directive? Yes  Type of Estate agent of [place];Out of facility DNR (pink MOST or yellow form)  Does patient want to make changes to medical advance directive? No - Patient declined  Copy of Healthcare Power of Attorney in Chart? Yes - validated most recent copy scanned in chart (See row information)  Pre-existing out of facility DNR order (yellow form or pink MOST form) Yellow form placed in chart (order not valid for inpatient use)     Chief Complaint  Patient presents with   Medical Management of Chronic Issues    HPI:  Pt is a 82 y.o. female seen today for medical management of chronic diseases.    Parkinson's, symptoms worsened, unable to swallow, hallucination/delusion: c/o people bing in her room, some irritability and confusion episodes, no focal weakness noted, on Sinemet, Comtan, started Neupro 05/08/21,  f/u neurology at [facility]. double vision, chronic, saw Ophthalmology  Chronic edema BLE, did not tolerated diuretics in the past. Off Metolazone 09/27/19. Echo EF normal in the past.               Anxiety, chronic, situational             PAD 12/25/20 ABI R+L 0.7, moderate arterial disease, vascular specialist if desires.              GERD, takes Protonix, Simethicone.                           Afib, heart rate is in control, on Xarelto, off Diltiazem. Hgb 11.8 11/22/20, TSH 1.30 03/29/20             OA,  on Tylenol, Methocarbamol, shoulder pain, f/u Ortho             Orthostatic hypotension, takes Midodrine.              Anemia, stable, Hgb 11.7 06/18/21  takes Fe             Urge incontinent of urine, takes Mirabegron, needs urinate q3-4 hours to avoid bladder discomfort.              Recurrent UTI, saw Urology, placed on Estrace x2/wk, Nitrofurantoin  daily             Constipation, takes MiraLax     Past Medical History:  Diagnosis Date   Acute lower UTI 09/14/2018   Anemia 10/15/2018   2016 colonoscopy 10/14/18 wbc 5.4, Hgb 10.1, plt 184 12/01/18 wbc 4.5, Hgb 11.1, plt 188, neutrophils 63,  Na 139, K 3.7, Bun 23, creat 0.69, eGFR 83 on Fe, Hgb  11.7 12/09/18    Atrial fibrillation 05/21/2010   10/07/18 Na 143, K 4.0, Bun 20, creat 0.79, eGFR 72, wbc 7.1, Hgb 10.9, plt 172 10/28/18 Na 142, K 4.0, Bun 19, creat 0.77, eGFR 74 11/18/18 Na 144, K 4.1, Bun 21, creat 0.69, eGFR 83   Bilateral lower extremity edema 07/19/2018   11/02/18 BMP 2 weeks.    Cervical pain (neck) 06/20/2010   Closed fracture of part of upper end of humerus 05/01/2015   Colles' fracture of right radius 03/05/2015   Constipation 07/19/2018   DEGENERATIVE JOINT DISEASE, RIGHT HIP 02/16/2007   Dupuytren's contracture    Dysuria 09/20/2018   09/20/18 c/o got up several times last night to go urinate, burning on urination, lower abd /back discomfort, but urinary frequency, leakage are not new. UA C/S, Pyridium 100mg  tid x 2 days.  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/2017   S/P breast biopsy, left    two o'clock position - benign   Toxic effect of venom(989.5) 07/27/2007   Tremor, unspecified 10/19/2015   Unstable gait 02/16/2017   Vaginal atrophy 10/19/2015   Venous insufficiency    Weakness 09/12/2018   Past Surgical History:  Procedure Laterality Date   BREAST BIOPSY Left    CATARACT EXTRACTION, BILATERAL      Allergies  Allergen Reactions   Doxycycline Other (See Comments)    Unknown   Sulfonamide Derivatives Other (See Comments)    Unknown     Allergies as of 05/05/2022       Reactions   Doxycycline Other (See Comments)   Unknown   Sulfonamide Derivatives Other (See Comments)   Unknown         Medication List        Accurate as of May 05, 2022 11:42 AM. If you have any questions, ask your nurse or doctor.          acetaminophen 500 MG tablet Commonly known as: TYLENOL Take 1,000 mg by mouth at bedtime. 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   calcium carbonate 750 MG chewable tablet Commonly known as: TUMS EX Chew 1 tablet by mouth daily.   carbidopa-levodopa 50-200 MG tablet Commonly known as: SINEMET CR Take 1 tablet by mouth at bedtime.   carbidopa-levodopa 25-100 MG disintegrating tablet Commonly known as: PARCOPA Take 2 tablets by mouth 5 (five) times daily.   carbidopa-levodopa 25-100 MG disintegrating tablet Commonly known as: PARCOPA Take 1 tablet by mouth See admin  instructions. May give one additional tablet daily in addition to scheduled dose as needed for increased symptoms related to Parkinson's. For taking at 11am if needed. Do not take after 1130 or within one hour of next dose.   Cranberry 450 MG Tabs Take 1 tablet by mouth  daily.   diclofenac Sodium 1 % Gel Commonly known as: VOLTAREN Apply 2 g topically 3 (three) times daily as needed (Neck pain).   entacapone 200 MG tablet Commonly known as: COMTAN Take 200 mg by mouth 3 (three) times daily.   ferrous sulfate 325 (65 FE) MG tablet Take 325 mg by mouth every Monday, Wednesday, and Friday.   hydrocortisone 2.5 % rectal cream Commonly known as: ANUSOL-HC Place 1 application. rectally daily as needed for hemorrhoids.   lactose free nutrition Liqd Take 237 mLs by mouth daily.   Macrodantin 100 MG capsule Generic drug: nitrofurantoin Take 100 mg by mouth daily. related to URINARY TRACT INFECTION   methocarbamol 500 MG tablet Commonly known as: ROBAXIN Take 250 mg by mouth daily as needed for muscle spasms.   midodrine 5 MG tablet Commonly known as: PROAMATINE Take 5 mg by mouth in the morning, at noon, and at bedtime.   mirabegron ER 50 MG Tb24 tablet Commonly known as: MYRBETRIQ Take 50 mg by mouth daily.   pantoprazole 40 MG tablet Commonly known as: PROTONIX Take 40 mg by mouth daily.   polyethylene glycol 17 g packet Commonly known as: MIRALAX / GLYCOLAX Take 17 g by mouth 2 (two) times daily.   polyvinyl alcohol 1.4 % ophthalmic solution Commonly known as: LIQUIFILM TEARS Place 1 drop into both eyes 3 (three) times daily as needed for dry eyes.   rivaroxaban 20 MG Tabs tablet Commonly known as: XARELTO Take 20 mg by mouth daily with supper.   rotigotine 2 MG/24HR Commonly known as: NEUPRO Place 1 patch onto the skin daily.   sennosides-docusate sodium 8.6-50 MG tablet Commonly known as: SENOKOT-S Take 2 tablets by mouth daily.   simethicone 80 MG chewable  tablet Commonly known as: MYLICON Chew 80 mg by mouth with breakfast, with lunch, and with evening meal.   sodium fluoride 1.1 % Crea dental cream Commonly known as: PREVIDENT 5000 PLUS Place 1 application. onto teeth in the morning and at bedtime.   Vitamin D3 10 MCG (400 UNIT) Caps Take 1 capsule by mouth daily.   zinc oxide 20 % ointment Apply 1 application. topically as needed for irritation. Apply to buttocks after every incontinent episode and for redness        Review of Systems  Constitutional:  Positive for appetite change and fatigue. Negative for fever.  HENT:  Positive for hearing loss and trouble swallowing. Negative for congestion and rhinorrhea.   Eyes:  Positive for visual disturbance.       C/o double vision  Respiratory:  Positive for shortness of breath. Negative for cough, chest tightness and wheezing.        DOE chronic  Cardiovascular:  Positive for leg swelling.  Gastrointestinal:  Negative for abdominal pain and constipation.  Genitourinary:  Positive for frequency. Negative for dysuria, genital sores and urgency.       Bladder discomfort prior to urination after waited for over 4-5 hours.   Musculoskeletal:  Positive for arthralgias, back pain and gait problem. Negative for myalgias.       Lower back discomfort positional. Chronic shoulder pain L>R, full ROM  Skin:  Negative for color change.  Neurological:  Positive for tremors and speech difficulty. Negative for weakness and headaches.       Fine tremor in fingers, burning sensation  in the R+ L great toes when touched by sheets, stiffness all limbs, difficulty making  her speech understood.   Psychiatric/Behavioral:  Positive for agitation,  confusion and hallucinations. Negative for behavioral problems and sleep disturbance. The patient is nervous/anxious.        Hallucination, delusion, irritable, confusion    Immunization History  Administered Date(s) Administered   Influenza Split 10/20/2012    Influenza Whole 10/21/2006   Influenza, High Dose Seasonal PF 10/17/2016, 11/02/2019, 11/12/2021   Influenza-Unspecified 10/20/2013, 10/22/2017, 11/07/2020   Moderna Sars-Covid-2 Vaccination 01/22/2019, 02/19/2019, 11/29/2019, 06/19/2020   Pfizer Covid-19 Vaccine Bivalent Booster 74yrs & up 10/10/2020, 11/21/2021   Pneumococcal Conjugate-13 01/31/2013   Pneumococcal Polysaccharide-23 01/20/2005, 11/24/2011   Td 01/21/2004   Tdap 03/16/2014   Zoster Recombinat (Shingrix) 01/27/2022, 01/27/2022   Zoster, Live 04/17/2009   Pertinent  Health Maintenance Due  Topic Date Due   INFLUENZA VACCINE  08/21/2022   DEXA SCAN  Completed   COLONOSCOPY (Pts 45-77yrs Insurance coverage will need to be confirmed)  Discontinued      11/08/2021    3:36 PM 01/17/2022    9:02 AM 01/28/2022   11:54 AM 02/18/2022    9:59 AM 04/08/2022    3:38 PM  Fall Risk  Falls in the past year? 1 0 0 0 0  Was there an injury with Fall? 0 0 0 0 0  Fall Risk Category Calculator 1 0 0 0 0  Fall Risk Category (Retired) Low Low Low    (RETIRED) Patient Fall Risk Level Low fall risk Low fall risk Low fall risk    Patient at Risk for Falls Due to No Fall Risks No Fall Risks No Fall Risks No Fall Risks No Fall Risks  Fall risk Follow up Falls evaluation completed Falls evaluation completed Falls evaluation completed Falls evaluation completed Falls evaluation completed   Functional Status Survey:    Vitals:   05/05/22 1111  BP: 132/62  Pulse: 74  Resp: 18  Temp: (!) 97 F (36.1 C)  SpO2: 93%   There is no height or weight on file to calculate BMI. Physical Exam Vitals and nursing note reviewed.  Constitutional:      Comments: Generalized weakness.   HENT:     Head: Normocephalic and atraumatic.     Nose: Nose normal.     Mouth/Throat:     Mouth: Mucous membranes are moist.  Eyes:     Extraocular Movements: Extraocular movements intact.     Conjunctiva/sclera: Conjunctivae normal.     Pupils: Pupils are  equal, round, and reactive to light.     Comments: Chronic double vision, see Ophthalmology 09/20/20  Cardiovascular:     Rate and Rhythm: Normal rate. Rhythm irregular.     Heart sounds: No murmur heard.    Comments: DP pulses present L>R Pulmonary:     Effort: Pulmonary effort is normal.     Comments: Central congestion Abdominal:     General: Bowel sounds are normal.     Palpations: Abdomen is soft.     Tenderness: There is no abdominal tenderness.  Musculoskeletal:     Cervical back: Normal range of motion and neck supple.     Right lower leg: Edema present.     Left lower leg: Edema present.     Comments: Trace -1+ edema BLE. Chronic lower back, shoulder R+L pain.   Skin:    General: Skin is warm and dry.     Comments: ABI showed moderate PAD  Right face small scabbed lesion, redness, mild warmth peri wound.   Neurological:     General: No focal deficit present.     Mental Status:  She is alert and oriented to person, place, and time. Mental status is at baseline.     Coordination: Coordination abnormal.     Gait: Gait abnormal.     Comments: Moves slow, tremor in hands. No facial or limb weakness.   Psychiatric:     Comments: Irritable, emotional, hallucination/delusion, confusion.      Labs reviewed: Recent Labs    06/18/21 0000  NA 142  K 4.4  CL 108  CO2 27*  BUN 21  CREATININE 0.8  CALCIUM 9.3   Recent Labs    06/18/21 0000  AST 13  ALT 6*  ALKPHOS 48  ALBUMIN 4.0   Recent Labs    06/18/21 0000  WBC 4.4  NEUTROABS 2,803.00  HGB 11.7*  HCT 35*  PLT 159   Lab Results  Component Value Date   TSH 1.30 03/29/2020   No results found for: "HGBA1C" Lab Results  Component Value Date   CHOL 185 03/24/2019   HDL 79 (A) 03/24/2019   LDLCALC 94 03/24/2019   LDLDIRECT 104.2 01/25/2013   TRIG 41 03/24/2019   CHOLHDL 2 08/29/2015    Significant Diagnostic Results in last 30 days:  No results found.  Assessment/Plan  Parkinsonism Parkinson's,  symptoms worsened, unable to swallow water from straw/cup/spoon upon my examination, hallucination/delusion: c/o people bing in her room, some irritability and confusion episodes, no focal weakness noted, on Sinemet, Comtan, started Neupro 05/08/21,  f/u neurology at [facility]. double vision, chronic, saw Ophthalmology  05/05/22 the patient declined ED eval, tel message left to the patient's daughter-called back,  will obtain CBC/diff, CMP/eGFR, cath UA C/S, CXR ap/lateral if desires-pending, change meds to liquid form if the patient is able to swallow water.   Edema of left lower extremity due to peripheral venous insufficiency Chronic edema BLE, did not tolerated diuretics in the past. Off Metolazone 09/27/19. Echo EF normal in the past.   Anxiety chronic, situational  GERD (gastroesophageal reflux disease) on Protonix, Simethicone.   Atrial fibrillation (HCC) heart rate is in control, on Xarelto, off Diltiazem. Hgb 11.8 11/22/20, TSH 1.30 03/29/20  Osteoarthritis, multiple sites on Tylenol, Methocarbamol, shoulder pain, f/u Ortho  Hypotension Maintained on  Midodrine.   Anemia  stable, Hgb 11.7 06/18/21  takes Fe  History of recurrent UTIs takes Mirabegron, needs urinate q3-4 hours to avoid bladder discomfort. Recurrent UTI, saw Urology, placed on Estrace x2/wk, Nitrofurantoin 100mg  daily   Family/ staff Communication: plan of care reviewed with the patient and charge nurse.   Labs/tests ordered: CBC/diff, CMP/eGFR, CXR ap/lateral, UA C/S if HPOA consents.   Time spend 35 minutes.

## 2022-05-05 NOTE — Assessment & Plan Note (Signed)
takes Mirabegron, needs urinate q3-4 hours to avoid bladder discomfort. Recurrent UTI, saw Urology, placed on Estrace x2/wk, Nitrofurantoin 100mg  daily

## 2022-05-05 NOTE — Assessment & Plan Note (Signed)
Parkinson's, symptoms worsened, unable to swallow water from straw/cup/spoon upon my examination, hallucination/delusion: c/o people bing in her room, some irritability and confusion episodes, no focal weakness noted, on Sinemet, Comtan, started Neupro 05/08/21,  f/u neurology at [facility]. double vision, chronic, saw Ophthalmology  05/05/22 the patient declined ED eval, tel message left to the patient's daughter-called back,  will obtain CBC/diff, CMP/eGFR, cath UA C/S, CXR ap/lateral if desires-pending, change meds to liquid form if the patient is able to swallow water.

## 2022-05-05 NOTE — Assessment & Plan Note (Signed)
heart rate is in control, on Xarelto, off Diltiazem. Hgb 11.8 11/22/20, TSH 1.30 03/29/20 °

## 2022-05-05 NOTE — Assessment & Plan Note (Signed)
chronic, situational 

## 2022-05-05 NOTE — Assessment & Plan Note (Signed)
stable, Hgb 11.7 06/18/21  takes Fe

## 2022-05-05 NOTE — Assessment & Plan Note (Signed)
takes MiraLax 

## 2022-05-05 NOTE — Assessment & Plan Note (Signed)
Chronic edema BLE, did not tolerated diuretics in the past. Off Metolazone 09/27/19. Echo EF normal in the past.

## 2022-05-05 NOTE — Assessment & Plan Note (Signed)
on Tylenol, Methocarbamol, shoulder pain, f/u Ortho 

## 2022-05-05 NOTE — Assessment & Plan Note (Signed)
Maintained on Midodrine.  °

## 2022-05-05 NOTE — Assessment & Plan Note (Signed)
on Protonix, Simethicone.

## 2022-05-06 ENCOUNTER — Encounter: Payer: Self-pay | Admitting: Nurse Practitioner

## 2022-05-06 DIAGNOSIS — R471 Dysarthria and anarthria: Secondary | ICD-10-CM | POA: Diagnosis not present

## 2022-05-06 DIAGNOSIS — R627 Adult failure to thrive: Secondary | ICD-10-CM | POA: Insufficient documentation

## 2022-05-06 DIAGNOSIS — R2689 Other abnormalities of gait and mobility: Secondary | ICD-10-CM | POA: Diagnosis not present

## 2022-05-06 DIAGNOSIS — N390 Urinary tract infection, site not specified: Secondary | ICD-10-CM | POA: Diagnosis not present

## 2022-05-06 DIAGNOSIS — M6281 Muscle weakness (generalized): Secondary | ICD-10-CM | POA: Diagnosis not present

## 2022-05-06 DIAGNOSIS — R1312 Dysphagia, oropharyngeal phase: Secondary | ICD-10-CM | POA: Diagnosis not present

## 2022-05-06 DIAGNOSIS — R262 Difficulty in walking, not elsewhere classified: Secondary | ICD-10-CM | POA: Diagnosis not present

## 2022-05-09 DIAGNOSIS — M6281 Muscle weakness (generalized): Secondary | ICD-10-CM | POA: Diagnosis not present

## 2022-05-09 DIAGNOSIS — R471 Dysarthria and anarthria: Secondary | ICD-10-CM | POA: Diagnosis not present

## 2022-05-09 DIAGNOSIS — R262 Difficulty in walking, not elsewhere classified: Secondary | ICD-10-CM | POA: Diagnosis not present

## 2022-05-09 DIAGNOSIS — R2689 Other abnormalities of gait and mobility: Secondary | ICD-10-CM | POA: Diagnosis not present

## 2022-05-09 DIAGNOSIS — R1312 Dysphagia, oropharyngeal phase: Secondary | ICD-10-CM | POA: Diagnosis not present

## 2022-05-21 DEATH — deceased
